# Patient Record
Sex: Female | Born: 1950 | Race: White | Hispanic: No | Marital: Married | Smoking: Never smoker
Health system: Southern US, Community
[De-identification: ages and names within clinical notes are randomized; demographics above are authoritative.]

## PROBLEM LIST (undated history)

## (undated) DIAGNOSIS — C3490 Malignant neoplasm of unspecified part of unspecified bronchus or lung: Secondary | ICD-10-CM

## (undated) DIAGNOSIS — K769 Liver disease, unspecified: Secondary | ICD-10-CM

## (undated) DIAGNOSIS — I10 Essential (primary) hypertension: Secondary | ICD-10-CM

## (undated) HISTORY — DX: Essential (primary) hypertension: I10

## (undated) HISTORY — DX: Liver disease, unspecified: K76.9

## (undated) HISTORY — PX: ABDOMINAL HYSTERECTOMY: SHX81

## (undated) HISTORY — PX: BREAST BIOPSY: SHX20

## (undated) HISTORY — DX: Malignant neoplasm of unspecified part of unspecified bronchus or lung: C34.90

---

## 2013-10-16 ENCOUNTER — Ambulatory Visit (INDEPENDENT_AMBULATORY_CARE_PROVIDER_SITE_OTHER): Payer: BC Managed Care – PPO | Admitting: Emergency Medicine

## 2013-10-16 ENCOUNTER — Other Ambulatory Visit (INDEPENDENT_AMBULATORY_CARE_PROVIDER_SITE_OTHER): Payer: BC Managed Care – PPO

## 2013-10-16 ENCOUNTER — Encounter (INDEPENDENT_AMBULATORY_CARE_PROVIDER_SITE_OTHER): Payer: Self-pay

## 2013-10-16 ENCOUNTER — Encounter: Payer: Self-pay | Admitting: Emergency Medicine

## 2013-10-16 VITALS — BP 128/86 | HR 79 | Ht 66.0 in | Wt 209.0 lb

## 2013-10-16 DIAGNOSIS — R222 Localized swelling, mass and lump, trunk: Secondary | ICD-10-CM

## 2013-10-16 DIAGNOSIS — R918 Other nonspecific abnormal finding of lung field: Secondary | ICD-10-CM

## 2013-10-16 LAB — CBC WITH DIFFERENTIAL/PLATELET
BASOS PCT: 0.5 % (ref 0.0–3.0)
Basophils Absolute: 0 10*3/uL (ref 0.0–0.1)
EOS ABS: 0.2 10*3/uL (ref 0.0–0.7)
EOS PCT: 2.3 % (ref 0.0–5.0)
HCT: 37.4 % (ref 36.0–46.0)
HEMOGLOBIN: 12.7 g/dL (ref 12.0–15.0)
LYMPHS PCT: 41.5 % (ref 12.0–46.0)
Lymphs Abs: 3.4 10*3/uL (ref 0.7–4.0)
MCHC: 33.8 g/dL (ref 30.0–36.0)
MCV: 86.4 fl (ref 78.0–100.0)
MONOS PCT: 9.4 % (ref 3.0–12.0)
Monocytes Absolute: 0.8 10*3/uL (ref 0.1–1.0)
Neutro Abs: 3.8 10*3/uL (ref 1.4–7.7)
Neutrophils Relative %: 46.3 % (ref 43.0–77.0)
Platelets: 303 10*3/uL (ref 150.0–400.0)
RBC: 4.33 Mil/uL (ref 3.87–5.11)
RDW: 13.5 % (ref 11.5–15.5)
WBC: 8.1 10*3/uL (ref 4.0–10.5)

## 2013-10-16 LAB — PROTIME-INR
INR: 1 ratio (ref 0.8–1.0)
Prothrombin Time: 11.5 s (ref 9.6–13.1)

## 2013-10-16 NOTE — Patient Instructions (Signed)
We will perform a bronchoscopy on October 27, 2013, at 8:00 at [place] will need to show up at 7:15am.  We will perform blood work today Follow with Dr Lamonte Sakai in 1 month

## 2013-10-16 NOTE — Progress Notes (Signed)
   Subjective:    Patient ID: Debra Hodges, female    DOB: 07-15-1950, 63 y.o.   MRN: [id number]  HPI 63 yo woman, never smoker, hx HTN, allergies, hypothyroidism. She had a syncopal episode 2 weeks ago with a fall. She was seen in ED and had a CT chest that showed a rounded LUL mass. She was admitted and her HCTZ She blamed some cough on allergies > clear mucous, no color, no hemoptysis. No real sick prodrome, no fevers. She still has cough, worse at night and disturbs her sleep.    Review of Systems  Constitutional: Negative for fever and unexpected weight change.  HENT: Positive for congestion. Negative for dental problem, ear pain, nosebleeds, postnasal drip, rhinorrhea, sinus pressure, sneezing, sore throat and trouble swallowing.   Eyes: Negative for redness and itching.  Respiratory: Positive for cough. Negative for chest tightness, shortness of breath and wheezing.   Cardiovascular: Negative for palpitations and leg swelling.  Gastrointestinal: Negative for nausea and vomiting.  Genitourinary: Negative for dysuria.  Musculoskeletal: Negative for joint swelling.  Skin: Negative for rash.  Neurological: Negative for headaches.  Hematological: Does not bruise/bleed easily.  Psychiatric/Behavioral: Negative for dysphoric mood. The patient is not nervous/anxious.     Past Medical History  Diagnosis Date  . High blood pressure      Family History  Problem Relation Age of Onset  . High blood pressure Mother      History   Social History  . Marital Status: Married    Spouse Name: N/A    Number of Children: N/A  . Years of Education: N/A   Occupational History  . accountant    Social History Main Topics  . Smoking status: Never Smoker   . Smokeless tobacco: Not on file  . Alcohol Use: No     Comment: social  . Drug Use: No  . Sexual Activity: Not on file   Other Topics Concern  . Not on file   Social History Narrative  . No narrative on file     Allergies    Allergen Reactions  . Penicillins     hives     No outpatient prescriptions prior to visit.   No facility-administered medications prior to visit.       Objective:   Physical Exam Filed Vitals:   10/16/13 1612  BP: 128/86  Pulse: 79  Height: 5\' 6"  (1.676 m)  Weight: 209 lb (94.802 kg)  SpO2: 98%   Gen: Pleasant, well-nourished, in no distress,  normal affect  ENT: Bruising on the L side of her face, mouth clear,  oropharynx clear, no postnasal drip  Neck: No JVD, no TMG, no carotid bruits  Lungs: No use of accessory muscles, soft insp squeak and rhonchi L upper zone  Cardiovascular: RRR, heart sounds normal, no murmur or gallops, no peripheral edema  Musculoskeletal: No deformities, no cyanosis or clubbing  Neuro: alert, non focal  Skin: Warm, no lesions or rashes      Assessment & Plan:  Lung mass Reviewed CT scan from [place], shows a LUL mass with possible segmental bronchial endobronchial involvement.  - needs FOB for sampling, have scheduled for 10/27/13 - CBC and INR today

## 2013-10-16 NOTE — Assessment & Plan Note (Signed)
Reviewed CT scan from [facility], shows a LUL mass with possible segmental bronchial endobronchial involvement.  - needs FOB for sampling, have scheduled for 10/27/13 - CBC and INR today

## 2013-10-17 ENCOUNTER — Telehealth: Payer: Self-pay | Admitting: Emergency Medicine

## 2013-10-17 MED ORDER — HYDROCODONE-HOMATROPINE 5-1.5 MG/5ML PO SYRP: 5.0000 mL | ORAL_SOLUTION | Freq: Four times a day (QID) | ORAL | Status: DC | PRN

## 2013-10-17 NOTE — Telephone Encounter (Signed)
Spoke with pt and advised her per RB to come by and pick up rx for hycodan cough syrup. Pt verbalized understanding and will have her husband stop to pick up rx this evening. Nothing further needed

## 2013-10-17 NOTE — Telephone Encounter (Signed)
Spoke with the pt  She states that RB told her that he was going to be calling in something for cough, but nothing was sent  Please advise what you want her to have  Thanks!

## 2013-10-27 ENCOUNTER — Ambulatory Visit (HOSPITAL_COMMUNITY): Payer: No Typology Code available for payment source

## 2013-10-27 ENCOUNTER — Encounter (HOSPITAL_COMMUNITY): Payer: Self-pay | Admitting: Respiratory Therapy

## 2013-10-27 ENCOUNTER — Ambulatory Visit (HOSPITAL_COMMUNITY)
Admission: RE | Admit: 2013-10-27 | Discharge: 2013-10-27 | Disposition: A | Discharge status: Home / Self Care | Payer: No Typology Code available for payment source | Source: Ambulatory Visit | Attending: Emergency Medicine | Admitting: Emergency Medicine

## 2013-10-27 ENCOUNTER — Encounter (HOSPITAL_COMMUNITY)
Admission: RE | Disposition: A | Discharge status: Home / Self Care | Payer: BC Managed Care – PPO | Source: Ambulatory Visit | Attending: Emergency Medicine

## 2013-10-27 DIAGNOSIS — R918 Other nonspecific abnormal finding of lung field: Secondary | ICD-10-CM

## 2013-10-27 DIAGNOSIS — E039 Hypothyroidism, unspecified: Secondary | ICD-10-CM | POA: Insufficient documentation

## 2013-10-27 DIAGNOSIS — C3410 Malignant neoplasm of upper lobe, unspecified bronchus or lung: Secondary | ICD-10-CM | POA: Insufficient documentation

## 2013-10-27 DIAGNOSIS — R222 Localized swelling, mass and lump, trunk: Secondary | ICD-10-CM

## 2013-10-27 DIAGNOSIS — I10 Essential (primary) hypertension: Secondary | ICD-10-CM | POA: Insufficient documentation

## 2013-10-27 HISTORY — PX: VIDEO BRONCHOSCOPY: SHX5072

## 2013-10-27 SURGERY — BRONCHOSCOPY, WITH FLUOROSCOPY
Anesthesia: Moderate Sedation | Laterality: Bilateral

## 2013-10-27 MED ORDER — PHENYLEPHRINE HCL 0.25 % NA SOLN
NASAL | Status: DC | PRN
  Administered 2013-10-27: 2 via NASAL

## 2013-10-27 MED ORDER — FENTANYL CITRATE 0.05 MG/ML IJ SOLN
INTRAMUSCULAR | Status: DC | PRN
  Administered 2013-10-27 (×2): 25 ug via INTRAVENOUS
  Administered 2013-10-27: 75 ug via INTRAVENOUS
  Administered 2013-10-27: 50 ug via INTRAVENOUS
  Administered 2013-10-27: 25 ug via INTRAVENOUS

## 2013-10-27 MED ORDER — EPINEPHRINE HCL 0.1 MG/ML IJ SOSY
PREFILLED_SYRINGE | INTRAMUSCULAR | Status: DC | PRN
  Administered 2013-10-27: .05 mg via ENDOTRACHEOPULMONARY

## 2013-10-27 MED ORDER — MIDAZOLAM HCL 10 MG/2ML IJ SOLN
INTRAMUSCULAR | Status: AC
  Filled 2013-10-27: qty 4

## 2013-10-27 MED ORDER — FENTANYL CITRATE 0.05 MG/ML IJ SOLN
INTRAMUSCULAR | Status: AC
  Filled 2013-10-27: qty 4

## 2013-10-27 MED ORDER — LIDOCAINE HCL 2 % EX GEL: Freq: Once | CUTANEOUS | Status: DC

## 2013-10-27 MED ORDER — MIDAZOLAM HCL 10 MG/2ML IJ SOLN
INTRAMUSCULAR | Status: DC | PRN
  Administered 2013-10-27: 1 mg via INTRAVENOUS
  Administered 2013-10-27 (×2): 2 mg via INTRAVENOUS
  Administered 2013-10-27: 3 mg via INTRAVENOUS

## 2013-10-27 MED ORDER — LIDOCAINE HCL 1 % IJ SOLN
INTRAMUSCULAR | Status: DC | PRN
  Administered 2013-10-27: 5 mL

## 2013-10-27 MED ORDER — PHENYLEPHRINE HCL 0.25 % NA SOLN: 1.0000 | Freq: Four times a day (QID) | NASAL | Status: DC | PRN

## 2013-10-27 MED ORDER — SODIUM CHLORIDE 0.9 % IV SOLN
INTRAVENOUS | Status: DC
  Administered 2013-10-27: 08:00:00 via INTRAVENOUS

## 2013-10-27 MED ORDER — LIDOCAINE HCL 2 % EX GEL
CUTANEOUS | Status: DC | PRN
  Administered 2013-10-27: 2

## 2013-10-27 NOTE — Discharge Instructions (Signed)
Flexible Bronchoscopy, Care After These instructions give you information on caring for yourself after your procedure. Your doctor may also give you more specific instructions. Call your doctor if you have any problems or questions after your procedure. HOME CARE  Do not eat or drink anything for 2 hours after your procedure. If you try to eat or drink before the medicine wears off, food or drink could go into your lungs. You could also burn yourself.  After 2 hours have passed and when you can cough and gag normally, you may eat soft food and drink liquids slowly.  The day after the test, you may eat your normal diet.  You may do your normal activities.  Keep all doctor visits. GET HELP RIGHT AWAY IF:  You get more and more short of breath.  You get lightheaded.  You feel like you are going to pass out (faint).  You have chest pain.  You have new problems that worry you.  You cough up more than a little blood.  You cough up more blood than before. MAKE SURE YOU:  Understand these instructions.  Will watch your condition.  Will get help right away if you are not doing well or get worse. Document Released: 03/12/2009 Document Revised: 03/05/2013 Document Reviewed: 01/17/2013 [facility] Patient Information 2014 [place].   Do not eat or drink anything until 11 am today Monday June 1,2015

## 2013-10-27 NOTE — Interval H&P Note (Signed)
PCCM Interval Note  S: pt presents today for FOB with biopsies. She is clinically unchanged - has cough with mostly clear mucous. Cough suppression has helped some. No breathing changes, no CP. She understands the procedure and elects to proceed.   Filed Vitals:   10/27/13 0705 10/27/13 0710 10/27/13 0712  BP: 145/84 145/84 154/86  Pulse: 81 97 83  Resp: 22  82  SpO2: 99% 98% 98%   Plans:  FOB with biopsies  Baltazar Apo, MD, PhD 10/27/2013, 8:10 AM  Pulmonary and Critical Care [phone] or if no answer [phone]

## 2013-10-27 NOTE — Op Note (Signed)
Video Bronchoscopy Procedure Note  Date of Operation: 10/27/2013  Pre-op Diagnosis: LUL mass  Post-op Diagnosis: Same  Surgeon: Baltazar Apo  Assistants: none  Anesthesia: conscious sedation, moderate sedation  Meds Given: fentanyl 238mcg, versed 8mg  in divided doses, epi 1:10000 dil 5cc total, 1% lidocaine 25cc total  Operation: Flexible video fiberoptic bronchoscopy and biopsies.  Estimated Blood Loss: 16XW  Complications: none noted  Indications and History: Debra Hodges is 63 y.o. with history of tobacco use. She was found to have a LUL mass on CXR and then CT scan chest at [facility].  Recommendation was to perform video fiberoptic bronchoscopy with biopsies. The risks, benefits, complications, treatment options and expected outcomes were discussed with the patient.  The possibilities of pneumothorax, pneumonia, reaction to medication, pulmonary aspiration, perforation of a viscus, bleeding, failure to diagnose a condition and creating a complication requiring transfusion or operation were discussed with the patient who freely signed the consent.    Description of Procedure: The patient was seen in the Preoperative Area, was examined and was deemed appropriate to proceed.  The patient was taken to [facility], identified as Debra Hodges and the procedure verified as Flexible Video Fiberoptic Bronchoscopy.  A Time Out was held and the above information confirmed.   Conscious sedation was initiated as indicated above. The video fiberoptic bronchoscope was introduced via the R nare and a general inspection was performed which showed posterior pharyngeal cobblestoning but normal cords, normal trachea, normal main carina. The R sided airways were inspected and were diffusely edematous, but showed normal RUL, BI, RML and RLL. The L side was then inspected. The LUL carina and the lingular and LUL segmental airways were narrowed with some obvious vascular pale mucosa at  each carina. The LLL airways were grossly normal. The scope would pass into the LUL but all airways were narrow. As soon as the scope entered the LUL airway there was some oozing of blood. 5cc of 1:10000 dilution epi was injected onto the airways.  A brush and forceps would pass into the orifices of the lingula and LUL. Under fluoro guidance,  LUL brushings and transbronchial biopsies were performed.  Endobronchial brushings, endobronchial forceps biopsies then were performed. The initial moderate bleeding stopped quickly. Finally endobronchial washings were pooled to be sent for cytology. The patient tolerated the procedure well with the exception of frequent coughhing. The bronchoscope was removed. There were no obvious complications. A CXR will be performed.   Samples: 1. Transbronchial biopsies from LUL 2. Transbronchial brushings from LUL 2. Endobronchial brushings from LUL 3. Endobronchial forceps biopsies from LUL 4. Bronchial washings from LUL  Plans:  We will review the cytology results with the patient when they become available.  Outpatient followup will be with Dr Lamonte Sakai.    Baltazar Apo, MD, PhD 10/27/2013, 8:49 AM [facility] [phone] or if no answer [phone]

## 2013-10-27 NOTE — Progress Notes (Signed)
Video bronchoscopy  Intervention transbronchial biopsy  Intervention endobronchial biopsy  Intervention transbronchial brushing  Intervention endobronchial brushing  Intervention bronchial washing  Good tolerance pt. Coughed during entire procedure  Baltazar Apo, MD, PhD 10/27/2013, 11:26 AM [facility] [phone] or if no answer [phone]

## 2013-10-27 NOTE — H&P (View-Only) (Signed)
   Subjective:    Patient ID: Debra Hodges, female    DOB: 18-Aug-1950, 63 y.o.   MRN: [id number]  HPI 63 yo woman, never smoker, hx HTN, allergies, hypothyroidism. She had a syncopal episode 2 weeks ago with a fall. She was seen in ED and had a CT chest that showed a rounded LUL mass. She was admitted and her HCTZ She blamed some cough on allergies > clear mucous, no color, no hemoptysis. No real sick prodrome, no fevers. She still has cough, worse at night and disturbs her sleep.    Review of Systems  Constitutional: Negative for fever and unexpected weight change.  HENT: Positive for congestion. Negative for dental problem, ear pain, nosebleeds, postnasal drip, rhinorrhea, sinus pressure, sneezing, sore throat and trouble swallowing.   Eyes: Negative for redness and itching.  Respiratory: Positive for cough. Negative for chest tightness, shortness of breath and wheezing.   Cardiovascular: Negative for palpitations and leg swelling.  Gastrointestinal: Negative for nausea and vomiting.  Genitourinary: Negative for dysuria.  Musculoskeletal: Negative for joint swelling.  Skin: Negative for rash.  Neurological: Negative for headaches.  Hematological: Does not bruise/bleed easily.  Psychiatric/Behavioral: Negative for dysphoric mood. The patient is not nervous/anxious.     Past Medical History  Diagnosis Date  . High blood pressure      Family History  Problem Relation Age of Onset  . High blood pressure Mother      History   Social History  . Marital Status: Married    Spouse Name: N/A    Number of Children: N/A  . Years of Education: N/A   Occupational History  . accountant    Social History Main Topics  . Smoking status: Never Smoker   . Smokeless tobacco: Not on file  . Alcohol Use: No     Comment: social  . Drug Use: No  . Sexual Activity: Not on file   Other Topics Concern  . Not on file   Social History Narrative  . No narrative on file     Allergies    Allergen Reactions  . Penicillins     hives     No outpatient prescriptions prior to visit.   No facility-administered medications prior to visit.       Objective:   Physical Exam Filed Vitals:   10/16/13 1612  BP: 128/86  Pulse: 79  Height: 5\' 6"  (1.676 m)  Weight: 209 lb (94.802 kg)  SpO2: 98%   Gen: Pleasant, well-nourished, in no distress,  normal affect  ENT: Bruising on the L side of her face, mouth clear,  oropharynx clear, no postnasal drip  Neck: No JVD, no TMG, no carotid bruits  Lungs: No use of accessory muscles, soft insp squeak and rhonchi L upper zone  Cardiovascular: RRR, heart sounds normal, no murmur or gallops, no peripheral edema  Musculoskeletal: No deformities, no cyanosis or clubbing  Neuro: alert, non focal  Skin: Warm, no lesions or rashes      Assessment & Plan:  Lung mass Reviewed CT scan from [place], shows a LUL mass with possible segmental bronchial endobronchial involvement.  - needs FOB for sampling, have scheduled for 10/27/13 - CBC and INR today

## 2013-10-28 ENCOUNTER — Encounter (HOSPITAL_COMMUNITY): Payer: Self-pay | Admitting: Emergency Medicine

## 2013-10-29 ENCOUNTER — Telehealth: Payer: Self-pay | Admitting: Emergency Medicine

## 2013-10-29 DIAGNOSIS — C3490 Malignant neoplasm of unspecified part of unspecified bronchus or lung: Secondary | ICD-10-CM

## 2013-10-29 NOTE — Telephone Encounter (Signed)
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 I will refer her to thoracic oncology or [facility]. Her path will need to be sent for genetic testing. I will mention this at the Thoracic oncology meeting in the am

## 2013-10-29 NOTE — Telephone Encounter (Signed)
Before calling the pt, Mindy asked RB if pt needed ov to discuss results, and he stated that he would call her with these today  I called the pt and notified her of this She states to call her cell at [phone]

## 2013-10-30 ENCOUNTER — Telehealth: Payer: Self-pay | Admitting: *Deleted

## 2013-10-30 NOTE — Telephone Encounter (Signed)
Called pt set up appt for [facility] 11/06/13.  Pt verbalized understanding of appt time and place.

## 2013-10-31 ENCOUNTER — Telehealth: Payer: Self-pay | Admitting: Emergency Medicine

## 2013-10-31 MED ORDER — HYDROCODONE-HOMATROPINE 5-1.5 MG/5ML PO SYRP: 5.0000 mL | ORAL_SOLUTION | Freq: Four times a day (QID) | ORAL | Status: DC | PRN

## 2013-10-31 NOTE — Telephone Encounter (Signed)
Pt in lobby to get rx

## 2013-10-31 NOTE — Telephone Encounter (Signed)
Per RB ok for hycodan rx

## 2013-11-03 ENCOUNTER — Encounter: Payer: Self-pay | Admitting: *Deleted

## 2013-11-05 ENCOUNTER — Other Ambulatory Visit: Payer: Self-pay | Admitting: *Deleted

## 2013-11-05 DIAGNOSIS — R918 Other nonspecific abnormal finding of lung field: Secondary | ICD-10-CM

## 2013-11-06 ENCOUNTER — Ambulatory Visit: Payer: No Typology Code available for payment source | Admitting: Physical Therapy

## 2013-11-06 ENCOUNTER — Other Ambulatory Visit (HOSPITAL_COMMUNITY)
Admission: RE | Admit: 2013-11-06 | Discharge: 2013-11-06 | Disposition: A | Discharge status: Home / Self Care | Payer: No Typology Code available for payment source | Source: Ambulatory Visit | Attending: Internal Medicine | Admitting: Internal Medicine

## 2013-11-06 ENCOUNTER — Ambulatory Visit (HOSPITAL_COMMUNITY)
Admission: RE | Admit: 2013-11-06 | Discharge: 2013-11-06 | Disposition: A | Discharge status: Home / Self Care | Payer: No Typology Code available for payment source | Source: Ambulatory Visit | Attending: Cardiothoracic Surgery | Admitting: Cardiothoracic Surgery

## 2013-11-06 ENCOUNTER — Other Ambulatory Visit: Payer: BC Managed Care – PPO

## 2013-11-06 ENCOUNTER — Ambulatory Visit
Admission: RE | Admit: 2013-11-06 | Payer: BC Managed Care – PPO | Source: Ambulatory Visit | Admitting: Radiation Oncology

## 2013-11-06 ENCOUNTER — Other Ambulatory Visit: Payer: Self-pay | Admitting: *Deleted

## 2013-11-06 ENCOUNTER — Telehealth: Payer: Self-pay | Admitting: *Deleted

## 2013-11-06 ENCOUNTER — Ambulatory Visit: Payer: BC Managed Care – PPO | Admitting: Internal Medicine

## 2013-11-06 DIAGNOSIS — R0609 Other forms of dyspnea: Secondary | ICD-10-CM | POA: Insufficient documentation

## 2013-11-06 DIAGNOSIS — R918 Other nonspecific abnormal finding of lung field: Secondary | ICD-10-CM

## 2013-11-06 DIAGNOSIS — R059 Cough, unspecified: Secondary | ICD-10-CM | POA: Insufficient documentation

## 2013-11-06 DIAGNOSIS — R05 Cough: Secondary | ICD-10-CM | POA: Insufficient documentation

## 2013-11-06 DIAGNOSIS — C3490 Malignant neoplasm of unspecified part of unspecified bronchus or lung: Secondary | ICD-10-CM | POA: Insufficient documentation

## 2013-11-06 DIAGNOSIS — R0989 Other specified symptoms and signs involving the circulatory and respiratory systems: Secondary | ICD-10-CM | POA: Insufficient documentation

## 2013-11-06 DIAGNOSIS — R062 Wheezing: Secondary | ICD-10-CM | POA: Insufficient documentation

## 2013-11-06 LAB — PULMONARY FUNCTION TEST
DL/VA % pred: 81 %
DL/VA: 4.19 ml/min/mmHg/L
DLCO unc % pred: 61 %
DLCO unc: 17.33 ml/min/mmHg
FEF 25-75 Pre: 1.5 L/sec
FEF2575-%Pred-Pre: 63 %
FEV1-%Pred-Pre: 69 %
FEV1-Pre: 1.92 L
FEV1FVC-%Pred-Pre: 93 %
FEV6-%Pred-Pre: 77 %
FEV6-Pre: 2.66 L
FEV6FVC-%Pred-Pre: 103 %
FVC-%Pred-Pre: 74 %
FVC-Pre: 2.67 L
Pre FEV1/FVC ratio: 72 %
Pre FEV6/FVC Ratio: 100 %
RV % pred: 80 %
RV: 1.78 L
TLC % pred: 80 %
TLC: 4.41 L

## 2013-11-06 LAB — CREATININE, SERUM
Creatinine, Ser: 0.71 mg/dL (ref 0.50–1.10)
GFR calc Af Amer: >90 mL/min (ref >90)
GFR calc non Af Amer: 90 mL/min — ABNORMAL LOW (ref >90)

## 2013-11-06 MED ORDER — GADOBENATE DIMEGLUMINE 529 MG/ML IV SOLN
20.0000 mL | Freq: Once | INTRAVENOUS | Status: AC | PRN
  Administered 2013-11-06: 20 mL via INTRAVENOUS

## 2013-11-06 NOTE — Telephone Encounter (Signed)
Called pt to let her know she does not need to be seen today.  She need further work up.  She was frustrated but was understanding.  Dr. Servando Snare gave me verbal order and I will called to get PET/MRI Brain/PFT's.  I stated I would call back.

## 2013-11-06 NOTE — Telephone Encounter (Signed)
Called pt back to set up appt.'s she verbalized understanding

## 2013-11-06 NOTE — Telephone Encounter (Signed)
Called pt back with appt for PET 11/11/13 at 7:45 and [facility] 11/13/13 at 4:00.  She verbalized understanding of appt time and place.

## 2013-11-10 ENCOUNTER — Telehealth: Payer: Self-pay | Admitting: Emergency Medicine

## 2013-11-10 NOTE — Telephone Encounter (Signed)
Advised pt of MRI results per RB Pt verbalized understanding and had no further questions

## 2013-11-10 NOTE — Telephone Encounter (Signed)
Per RB ok to cancel appointment for 11/12/13. Pt requesting MRI results please advised RB, thanks

## 2013-11-10 NOTE — Telephone Encounter (Signed)
Please let her know the MRI is negative. No evidence of cancer involvement

## 2013-11-11 ENCOUNTER — Ambulatory Visit: Payer: Self-pay | Admitting: Cardiothoracic Surgery

## 2013-11-12 ENCOUNTER — Ambulatory Visit: Payer: BC Managed Care – PPO | Admitting: Emergency Medicine

## 2013-11-13 ENCOUNTER — Telehealth: Payer: Self-pay | Admitting: Internal Medicine

## 2013-11-13 ENCOUNTER — Ambulatory Visit (HOSPITAL_BASED_OUTPATIENT_CLINIC_OR_DEPARTMENT_OTHER): Payer: No Typology Code available for payment source | Admitting: Internal Medicine

## 2013-11-13 ENCOUNTER — Encounter: Payer: Self-pay | Admitting: Internal Medicine

## 2013-11-13 ENCOUNTER — Telehealth: Payer: Self-pay | Admitting: *Deleted

## 2013-11-13 ENCOUNTER — Ambulatory Visit: Payer: No Typology Code available for payment source | Attending: Internal Medicine | Admitting: Physical Therapy

## 2013-11-13 VITALS — BP 152/72 | HR 91 | Temp 98.3°F | Resp 17 | Ht 66.0 in | Wt 207.0 lb

## 2013-11-13 DIAGNOSIS — C3490 Malignant neoplasm of unspecified part of unspecified bronchus or lung: Secondary | ICD-10-CM | POA: Insufficient documentation

## 2013-11-13 DIAGNOSIS — Z9181 History of falling: Secondary | ICD-10-CM | POA: Insufficient documentation

## 2013-11-13 DIAGNOSIS — IMO0001 Reserved for inherently not codable concepts without codable children: Secondary | ICD-10-CM | POA: Insufficient documentation

## 2013-11-13 DIAGNOSIS — C3412 Malignant neoplasm of upper lobe, left bronchus or lung: Secondary | ICD-10-CM

## 2013-11-13 DIAGNOSIS — C3410 Malignant neoplasm of upper lobe, unspecified bronchus or lung: Secondary | ICD-10-CM

## 2013-11-13 DIAGNOSIS — C787 Secondary malignant neoplasm of liver and intrahepatic bile duct: Secondary | ICD-10-CM

## 2013-11-13 NOTE — Telephone Encounter (Signed)
This number is disconnected

## 2013-11-13 NOTE — Progress Notes (Signed)
[place] Telephone:(336) [phone]   Fax:(336) [phone] Multidisciplinary thoracic oncology clinic ([place])  CONSULT NOTE  REFERRING PHYSICIAN: Dr. Baltazar Apo  REASON FOR CONSULTATION:  63 years old white female recently diagnosed with lung cancer.  HPI Debra Hodges Hodges is a 63 y.o. female a never smoker with past medical history significant for hypertension and hypothyroidism. The patient mentioned that 6 weeks ago she had few episodes of syncope and falls. She passed out and she was evaluated at the emergency Department at [facility] and [facility]. Chest x-ray followed by CT scan of the chest performed at that time showed a rounded left upper lobe mass. She was referred to Dr. Lamonte Sakai for evaluation regarding these findings. On 10/27/2013 the patient underwent flexible video fiberoptic bronchoscopy and biopsy of the left upper lobe lung nodule.  The final pathology (Accession: [id number]) was consistent with adenocarcinoma. The patient was referred initially to thoracic surgery with the expectation that she has early stage disease for consideration of surgical resection. The tissue blocks were sent to [facility] for molecular biomarker studies. She had a recent PET scan as well as MRI of the brain performed. MRI of the brain on 11/06/2013 showed no acute or metastatic intracranial abnormalities. PET scan performed at [facility] on 11/11/2013 showed large lingular mass markedly hypermetabolic and consistent with lung cancer. There are hypermetabolic left hilar nodes and contralateral paratracheal adenopathy. There was a small scattered pulmonary nodules suspicious for pulmonary metastatic disease in addition to hepatic metastatic disease. The patient was referred to me today for evaluation and recommendation regarding treatment of her recently diagnosed metastatic lung cancer. When seen today the patient is feeling fine except for occasional nausea  and cough productive of clear sputum and shortness of breath on exertion. She also has wheezes in her chest. She denied having any significant chest pain or hemoptysis. The patient denied having any significant weight loss or night sweats. She has no nausea or vomiting, no fever or chills. She denied having any significant headache or visual changes.  Family history significant for a mother with history of melanoma and father diagnosed with esophageal cancer at age 96. The patient is married and has one daughter. She was accompanied today by her husband. Debra Hodges Hodges. She works as an Optometrist. She has no history of smoking, alcohol or drug abuse. HPI  Past Medical History  Diagnosis Date  . High blood pressure     Past Surgical History  Procedure Laterality Date  . Abdominal hysterectomy    . Breast biopsy      Right breast  . Video bronchoscopy Bilateral 10/27/2013    Procedure: VIDEO BRONCHOSCOPY WITH FLUORO;  Surgeon: Collene Gobble, MD;  Location: [facility];  Service: Cardiopulmonary;  Laterality: Bilateral;    Family History  Problem Relation Age of Onset  . High blood pressure Mother     Social History History  Substance Use Topics  . Smoking status: Never Smoker   . Smokeless tobacco: Not on file  . Alcohol Use: No     Comment: social    Allergies  Allergen Reactions  . Penicillins     hives    Current Outpatient Prescriptions  Medication Sig Dispense Refill  . calcium carbonate (OS-CAL) 600 MG TABS tablet Take 600 mg by mouth daily.       Marland Kitchen CLIMARA 0.1 MG/24HR patch 1 patch once a week.      . diclofenac (VOLTAREN) 75 MG EC tablet Take 75 mg  by mouth 2 (two) times daily as needed.      Marland Kitchen HYDROcodone-homatropine (HYCODAN) 5-1.5 MG/5ML syrup Take 5 mLs by mouth every 6 (six) hours as needed for cough.  240 mL  0  . levothyroxine (SYNTHROID, LEVOTHROID) 50 MCG tablet Take 50 mcg by mouth daily before breakfast.      . metroNIDAZOLE (METROCREAM) 0.75 % cream Apply 1  application topically daily.      . Multiple Vitamins-Calcium (ONE-A-DAY WOMENS PO) Take 1 tablet by mouth daily.      . potassium chloride SA (K-DUR,KLOR-CON) 20 MEQ tablet 1 tablet 2 (two) times daily.      Marland Kitchen spironolactone (ALDACTONE) 25 MG tablet 1 tablet daily.      . vitamin C (ASCORBIC ACID) 500 MG tablet Take 500 mg by mouth daily.       No current facility-administered medications for this visit.    Review of Systems  Constitutional: negative Eyes: negative Ears, nose, mouth, throat, and face: negative Respiratory: positive for cough, dyspnea on exertion, sputum and wheezing Cardiovascular: negative Gastrointestinal: positive for nausea Genitourinary:negative Integument/breast: negative Hematologic/lymphatic: negative Musculoskeletal:negative Neurological: negative Behavioral/Psych: negative Endocrine: negative Allergic/Immunologic: negative  Physical Exam  RJJ:[id number], healthy, no distress, well nourished and well developed SKIN: skin color, texture, turgor are normal, no rashes or significant lesions HEAD: Normocephalic, No masses, lesions, tenderness or abnormalities EYES: normal, PERRLA EARS: External ears normal, Canals clear OROPHARYNX:no exudate, no erythema and lips, buccal mucosa, and tongue normal  NECK: supple, no adenopathy, no JVD LYMPH:  no palpable lymphadenopathy, no hepatosplenomegaly BREAST:not examined LUNGS: clear to auscultation , and palpation HEART: regular rate & rhythm, no murmurs and no gallops ABDOMEN:abdomen soft, non-tender, normal bowel sounds and no masses or organomegaly BACK: Back symmetric, no curvature., No CVA tenderness EXTREMITIES:no joint deformities, effusion, or inflammation, no edema, no skin discoloration  NEURO: alert & oriented x 3 with fluent speech, no focal motor/sensory deficits  PERFORMANCE STATUS: ECOG 1  LABORATORY DATA: Lab Results  Component Value Date   WBC 8.1 10/16/2013   HGB 12.7 10/16/2013   HCT 37.4  10/16/2013   MCV 86.4 10/16/2013   PLT 303.0 10/16/2013      Chemistry      Component Value Date/Time   CREATININE 0.71 Nov 18, 2013 1813   No results found for this basename: CALCIUM, ALKPHOS, AST, ALT, BILITOT       RADIOGRAPHIC STUDIES: Mr Kizzie Fantasia Contrast  November 18, 2013   CLINICAL DATA:  63 year old female with newly diagnosed lung cancer. Staging. Subsequent encounter.  EXAM: MRI HEAD WITHOUT AND WITH CONTRAST  TECHNIQUE: Multiplanar, multiecho pulse sequences of the brain and surrounding structures were obtained without and with intravenous contrast.  CONTRAST:  34m MULTIHANCE GADOBENATE DIMEGLUMINE 529 MG/ML IV SOLN  COMPARISON:  Head CT without contrast 10/01/2013.  FINDINGS: No abnormal enhancement identified. No midline shift, mass effect, or evidence of intracranial mass lesion.  No restricted diffusion to suggest acute infarction. No ventriculomegaly, extra-axial collection or acute intracranial hemorrhage. Cervicomedullary junction and pituitary are within normal limits. Cerebral volume is within normal limits for age. Major intracranial vascular flow voids are preserved. GPearline Cablesand white matter signal is within normal limits for age throughout the brain.  Grossly negative visualized cervical spinal cord. Mildly heterogeneous bone marrow signal diffusely. No destructive or suspicious marrow lesion identified. Visualized scalp soft tissues are within normal limits. Visible internal auditory structures appear normal. Negative paranasal sinuses and mastoids except for minor maxillary mucosal thickening. Visualized orbit soft tissues are within  normal limits.  IMPRESSION: No acute or metastatic intracranial abnormality. Normal for age MRI appearance of the brain.   Electronically Signed   By: Lars Pinks M.D.   On: 11/06/2013 20:27   Dg Chest Port 1 View  10/27/2013   CLINICAL DATA:  Status post bronchoscopy with biopsies.  EXAM: PORTABLE CHEST - 1 VIEW  COMPARISON:  CT chest and chest radiograph  10/01/2013.  FINDINGS: Trachea is midline. Heart size normal. Masslike opacity in the lingula is unchanged. No pneumothorax after bronchoscopic biopsy. Right lung is clear.  IMPRESSION: 1. No pneumothorax after bronchoscopic biopsy. 2. Masslike opacity in the lingula, as before, worrisome for primary bronchogenic carcinoma.   Electronically Signed   By: Lorin Picket M.D.   On: 10/27/2013 09:55   Dg C-arm Bronchoscopy  10/27/2013   CLINICAL DATA: Lung Mass   C-ARM BRONCHOSCOPY  Fluoroscopy was utilized by the requesting physician.  No radiographic  interpretation.     ASSESSMENT: This is a very pleasant 63 years old never smoker white female recently diagnosed with Stage IV (T1b, N3, M1b) non-small cell lung cancer, adenocarcinoma presented with left upper lobe lung mass in addition to left hilar and bilateral mediastinal lymphadenopathy as well as liver metastasis diagnosed in May of 2015. Molecular biomarker studies are still pending.   PLAN: I had a lengthy discussion with the patient and her husband today about her current disease stage, prognosis and treatment options. I explained to the patient that she has incurable condition and the treatment would be of palliative nature. She was given the option of palliative care and hospice referral versus consideration of systemic therapy either with targeted agent or systemic chemotherapy. The patient is a never smoker and there is a high probably that her tumor could harbor a driver mutation like EGFR mutation or ALK gene translocation. The tissue blocks were sent for molecular biomarker studies but the results are still pending. She is currently asymptomatic except for mild shortness of breath and cough. I recommended for the patient to wait for the molecular studies before discussion of any further treatment options. If the molecular studies are negative for driver mutations, I would consider the patient for systemic chemotherapy with carboplatin for  AUC of 5 and Alimta 500 mg/M2 every 3 weeks. She may be also considered for enrollment in the ECoG 5508 clinical trial with induction chemotherapy of carboplatin, paclitaxel and Avastin followed by maintenance randomization.  the patient agreed to wait until the molecular studies are available. I will arrange for the patient a followup appointment with me in 2 weeks for reevaluation and more detailed discussion of her treatment options. She was advised to call immediately if she has any concerning symptoms in the interval.  ADVANCE DIRECTIVES: The patient does not have advanced directives and she was given information packet.  The patient voices understanding of current disease status and treatment options and is in agreement with the current care plan.  All questions were answered. The patient knows to call the clinic with any problems, questions or concerns. We can certainly see the patient much sooner if necessary.  Thank you so much for allowing me to participate in the care of [facility]. I will continue to follow up the patient with you and assist in her care.  I spent 55 minutes counseling the patient face to face. The total time spent in the appointment was 80 minutes.  Disclaimer: This note was dictated with voice recognition software. Similar sounding words can inadvertently be  transcribed and may not be corrected upon review.   Rogan Ecklund K. 11/13/2013, 4:02 PM

## 2013-11-13 NOTE — Telephone Encounter (Signed)
gv adn printed appt sched and avs for pt for July  °

## 2013-11-13 NOTE — Telephone Encounter (Signed)
Called pt to change appt time

## 2013-11-14 ENCOUNTER — Encounter: Payer: Self-pay | Admitting: *Deleted

## 2013-11-14 ENCOUNTER — Telehealth: Payer: Self-pay | Admitting: *Deleted

## 2013-11-14 NOTE — [facility] Oncology Navigator Note (Unsigned)
Pt called and requested records.  I stated when pt comes back to cancer center she could fill out paper work to obtain records.

## 2013-11-14 NOTE — Telephone Encounter (Signed)
Called to check on pt after clinic yesterday.  Left vm message to call if needed.

## 2013-11-17 ENCOUNTER — Telehealth: Payer: Self-pay | Admitting: Emergency Medicine

## 2013-11-17 MED ORDER — HYDROCODONE-HOMATROPINE 5-1.5 MG/5ML PO SYRP: 5.0000 mL | ORAL_SOLUTION | Freq: Four times a day (QID) | ORAL | Status: DC | PRN

## 2013-11-17 NOTE — Telephone Encounter (Signed)
Rx signed and is up front for pick up  Spoke with pt and made aware this was done

## 2013-11-17 NOTE — Telephone Encounter (Signed)
[place] for refill of hydrometr. I can sign. I am in [place] in PM 11/17/2013  Dr. Brand Males, M.D., [facility].C.P Pulmonary and Critical Care Medicine Staff Physician [place] [facility] Pager: [phone], If no answer or between  15:00h - 7:00h: call [phone]  11/17/2013 12:46 PM

## 2013-11-17 NOTE — Telephone Encounter (Signed)
Pt returned call & can be reached at [phone].  Satira Anis

## 2013-11-17 NOTE — Telephone Encounter (Signed)
lmomtcb x1 for pt 

## 2013-11-17 NOTE — Telephone Encounter (Signed)
Thanks so much  Rx was given to [place] to have you sign  Please return to triage once done so that we can let the pt know  Thanks!

## 2013-11-17 NOTE — Telephone Encounter (Signed)
Spoke with the pt  She states that she has 1 dose left of the hydromet that RB prescribed for her on 10/31/13- # 240 ml with no refills  She states that her cough is no worse since last ov, but it is no better and would like refill on hydromet RB out of the office and so will not be able to sign rx  Will forward to doc of the day, please advise thanks!

## 2013-11-20 ENCOUNTER — Telehealth: Payer: Self-pay | Admitting: *Deleted

## 2013-11-20 NOTE — Telephone Encounter (Signed)
Called pt with appt 11/21/13 at 10:15 labs and 10:30 with Dr. Julien Nordmann.  Pt verbalized understanding of appt

## 2013-11-21 ENCOUNTER — Encounter: Payer: Self-pay | Admitting: Internal Medicine

## 2013-11-21 ENCOUNTER — Telehealth: Payer: Self-pay | Admitting: Internal Medicine

## 2013-11-21 ENCOUNTER — Other Ambulatory Visit (HOSPITAL_BASED_OUTPATIENT_CLINIC_OR_DEPARTMENT_OTHER): Payer: No Typology Code available for payment source

## 2013-11-21 ENCOUNTER — Ambulatory Visit (HOSPITAL_BASED_OUTPATIENT_CLINIC_OR_DEPARTMENT_OTHER): Payer: No Typology Code available for payment source | Admitting: Internal Medicine

## 2013-11-21 VITALS — BP 134/73 | HR 91 | Temp 98.4°F | Resp 19 | Ht 66.0 in | Wt 205.0 lb

## 2013-11-21 DIAGNOSIS — C3410 Malignant neoplasm of upper lobe, unspecified bronchus or lung: Secondary | ICD-10-CM

## 2013-11-21 DIAGNOSIS — C3412 Malignant neoplasm of upper lobe, left bronchus or lung: Secondary | ICD-10-CM

## 2013-11-21 DIAGNOSIS — R0989 Other specified symptoms and signs involving the circulatory and respiratory systems: Secondary | ICD-10-CM

## 2013-11-21 DIAGNOSIS — R059 Cough, unspecified: Secondary | ICD-10-CM

## 2013-11-21 DIAGNOSIS — R918 Other nonspecific abnormal finding of lung field: Secondary | ICD-10-CM

## 2013-11-21 DIAGNOSIS — C787 Secondary malignant neoplasm of liver and intrahepatic bile duct: Secondary | ICD-10-CM

## 2013-11-21 DIAGNOSIS — R05 Cough: Secondary | ICD-10-CM

## 2013-11-21 DIAGNOSIS — R0609 Other forms of dyspnea: Secondary | ICD-10-CM

## 2013-11-21 LAB — CBC WITH DIFFERENTIAL/PLATELET
BASO%: 0.3 % (ref 0.0–2.0)
Basophils Absolute: 0 10*3/uL (ref 0.0–0.1)
EOS ABS: 0.1 10*3/uL (ref 0.0–0.5)
EOS%: 1.6 % (ref 0.0–7.0)
HCT: 38.3 % (ref 34.8–46.6)
HGB: 12.7 g/dL (ref 11.6–15.9)
LYMPH%: 30.2 % (ref 14.0–49.7)
MCH: 28.6 pg (ref 25.1–34.0)
MCHC: 33.1 g/dL (ref 31.5–36.0)
MCV: 86.2 fL (ref 79.5–101.0)
MONO#: 0.7 10*3/uL (ref 0.1–0.9)
MONO%: 7.7 % (ref 0.0–14.0)
NEUT%: 60.2 % (ref 38.4–76.8)
NEUTROS ABS: 5.5 10*3/uL (ref 1.5–6.5)
Platelets: 307 10*3/uL (ref 145–400)
RBC: 4.45 10*6/uL (ref 3.70–5.45)
RDW: 13.5 % (ref 11.2–14.5)
WBC: 9.2 10*3/uL (ref 3.9–10.3)
lymph#: 2.8 10*3/uL (ref 0.9–3.3)

## 2013-11-21 LAB — COMPREHENSIVE METABOLIC PANEL (CC13)
ALBUMIN: 3.9 g/dL (ref 3.5–5.0)
ALK PHOS: 94 U/L (ref 40–150)
ALT: 23 U/L (ref 0–55)
AST: 25 U/L (ref 5–34)
Anion Gap: 9 mEq/L (ref 3–11)
BUN: 13.3 mg/dL (ref 7.0–26.0)
CO2: 25 mEq/L (ref 22–29)
Calcium: 9.6 mg/dL (ref 8.4–10.4)
Chloride: 106 mEq/L (ref 98–109)
Creatinine: 0.9 mg/dL (ref 0.6–1.1)
GLUCOSE: 97 mg/dL (ref 70–140)
POTASSIUM: 4.3 meq/L (ref 3.5–5.1)
Sodium: 140 mEq/L (ref 136–145)
Total Bilirubin: 0.52 mg/dL (ref 0.20–1.20)
Total Protein: 7.9 g/dL (ref 6.4–8.3)

## 2013-11-21 MED ORDER — ERLOTINIB HCL 150 MG PO TABS: 150.0000 mg | ORAL_TABLET | Freq: Every day | ORAL | Status: DC

## 2013-11-21 NOTE — Telephone Encounter (Signed)
gv and printed appt sched and avs fo rpt for July °

## 2013-11-21 NOTE — Telephone Encounter (Signed)
Faxed pt medical records to Dr.  Loyal Buba in [place]

## 2013-11-21 NOTE — Progress Notes (Signed)
Release of information form filled out by patient for records to be sent to Dr Hollice Gong in [place].  Form given to Medical records to review and process.  SLJ

## 2013-11-21 NOTE — Progress Notes (Signed)
[place] @ [phone] for tarceva pa form

## 2013-11-21 NOTE — Progress Notes (Signed)
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  PRIOR THERAPY: None  CURRENT THERAPY: Tarceva 150 mg by mouth daily. Expected to be started in the next few days.  INTERVAL HISTORY: Debra Hodges 63 y.o. female returns to the clinic today for followup visit accompanied by her husband. The patient is feeling fine today with no specific complaints except for the dry cough. She is currently on Hycodan with mild improvement. She denied having any significant chest pain but has mild shortness breath with exertion with no hemoptysis. She denied having any significant weight loss or night sweats. She has no nausea or vomiting. The molecular studies from [facility] showed EGFR amplification in addition to EGFR mutation in exon 21 ([id number]). She is in today for evaluation and discussion of her treatment options based on the recent molecular study.  MEDICAL HISTORY: Past Medical History  Diagnosis Date  . High blood pressure     ALLERGIES:  is allergic to penicillins.  MEDICATIONS:  Current Outpatient Prescriptions  Medication Sig Dispense Refill  . calcium carbonate (OS-CAL) 600 MG TABS tablet Take 600 mg by mouth daily.       Marland Kitchen CLIMARA 0.1 MG/24HR patch 1 patch once a week.      . diclofenac (VOLTAREN) 75 MG EC tablet Take 75 mg by mouth 2 (two) times daily as needed.      Marland Kitchen HYDROcodone-homatropine (HYCODAN) 5-1.5 MG/5ML syrup Take 5 mLs by mouth every 6 (six) hours as needed for cough.  240 mL  0  . levothyroxine (SYNTHROID, LEVOTHROID) 25 MCG tablet Take 25 mcg by mouth daily before breakfast.      .  metroNIDAZOLE (METROCREAM) 0.75 % cream Apply 1 application topically daily.      . Multiple Vitamins-Calcium (ONE-A-DAY WOMENS PO) Take 1 tablet by mouth daily.      . potassium chloride SA (K-DUR,KLOR-CON) 20 MEQ tablet 1 tablet 2 (two) times daily.      Marland Kitchen spironolactone (ALDACTONE) 25 MG tablet 1 tablet daily.      . vitamin C (ASCORBIC ACID) 500 MG tablet Take 500 mg by mouth daily.       No current facility-administered medications for this visit.    SURGICAL HISTORY:  Past Surgical History  Procedure Laterality Date  . Abdominal hysterectomy    . Breast biopsy      Right breast  . Video bronchoscopy Bilateral 10/27/2013    Procedure: VIDEO BRONCHOSCOPY WITH FLUORO;  Surgeon: Collene Gobble, MD;  Location: [facility];  Service: Cardiopulmonary;  Laterality: Bilateral;    REVIEW OF SYSTEMS:  Constitutional: negative Eyes: negative Ears, nose, mouth, throat, and face: negative Respiratory: positive for cough Cardiovascular: negative Gastrointestinal: negative Genitourinary:negative Integument/breast: negative Hematologic/lymphatic: negative Musculoskeletal:negative Neurological: negative Behavioral/Psych: negative Endocrine: negative Allergic/Immunologic: negative   PHYSICAL EXAMINATION: General appearance: alert, cooperative and no distress Head: Normocephalic, without obvious abnormality, atraumatic Neck: no adenopathy, no JVD, supple, symmetrical, trachea midline and thyroid not enlarged, symmetric, no tenderness/mass/nodules Lymph nodes: Cervical, supraclavicular, and axillary nodes normal. Resp: clear to auscultation bilaterally Back: symmetric, no curvature. ROM normal. No CVA tenderness. Cardio: regular rate and rhythm, S1,  S2 normal, no murmur, click, rub or gallop GI: soft, non-tender; bowel sounds normal; no masses,  no organomegaly Extremities: extremities normal, atraumatic, no cyanosis or edema Neurologic: Alert and oriented X 3, normal strength and tone.  Normal symmetric reflexes. Normal coordination and gait  ECOG PERFORMANCE STATUS: 1 - Symptomatic but completely ambulatory  Blood pressure 134/73, pulse 91, temperature 98.4 F (36.9 C), temperature source Oral, resp. rate 19, height $RemoveBe'5\' 6"'jnprMYDtX$  (1.676 m), weight 205 lb (92.987 kg), SpO2 98.00%.  LABORATORY DATA: Lab Results  Component Value Date   WBC 9.2 11/21/2013   HGB 12.7 11/21/2013   HCT 38.3 11/21/2013   MCV 86.2 11/21/2013   PLT 307 11/21/2013      Chemistry      Component Value Date/Time   NA 140 11/21/2013 1006   K 4.3 11/21/2013 1006   CO2 25 11/21/2013 1006   BUN 13.3 11/21/2013 1006   CREATININE 0.9 11/21/2013 1006   CREATININE 0.71 2013/12/02 1813      Component Value Date/Time   CALCIUM 9.6 11/21/2013 1006   ALKPHOS 94 11/21/2013 1006   AST 25 11/21/2013 1006   ALT 23 11/21/2013 1006   BILITOT 0.52 11/21/2013 1006       RADIOGRAPHIC STUDIES: Mr Kizzie Fantasia Contrast  Dec 02, 2013   CLINICAL DATA:  63 year old female with newly diagnosed lung cancer. Staging. Subsequent encounter.  EXAM: MRI HEAD WITHOUT AND WITH CONTRAST  TECHNIQUE: Multiplanar, multiecho pulse sequences of the brain and surrounding structures were obtained without and with intravenous contrast.  CONTRAST:  59mL MULTIHANCE GADOBENATE DIMEGLUMINE 529 MG/ML IV SOLN  COMPARISON:  Head CT without contrast 10/01/2013.  FINDINGS: No abnormal enhancement identified. No midline shift, mass effect, or evidence of intracranial mass lesion.  No restricted diffusion to suggest acute infarction. No ventriculomegaly, extra-axial collection or acute intracranial hemorrhage. Cervicomedullary junction and pituitary are within normal limits. Cerebral volume is within normal limits for age. Major intracranial vascular flow voids are preserved. Pearline Cables and white matter signal is within normal limits for age throughout the brain.  Grossly negative visualized cervical spinal cord. Mildly heterogeneous bone marrow signal diffusely. No  destructive or suspicious marrow lesion identified. Visualized scalp soft tissues are within normal limits. Visible internal auditory structures appear normal. Negative paranasal sinuses and mastoids except for minor maxillary mucosal thickening. Visualized orbit soft tissues are within normal limits.  IMPRESSION: No acute or metastatic intracranial abnormality. Normal for age MRI appearance of the brain.   Electronically Signed   By: Lars Pinks M.D.   On: 02-Dec-2013 20:27   Dg Chest Port 1 View  10/27/2013   CLINICAL DATA:  Status post bronchoscopy with biopsies.  EXAM: PORTABLE CHEST - 1 VIEW  COMPARISON:  CT chest and chest radiograph 10/01/2013.  FINDINGS: Trachea is midline. Heart size normal. Masslike opacity in the lingula is unchanged. No pneumothorax after bronchoscopic biopsy. Right lung is clear.  IMPRESSION: 1. No pneumothorax after bronchoscopic biopsy. 2. Masslike opacity in the lingula, as before, worrisome for primary bronchogenic carcinoma.   Electronically Signed   By: Lorin Picket M.D.   On: 10/27/2013 09:55   Dg C-arm Bronchoscopy  10/27/2013   CLINICAL DATA: Lung Mass   C-ARM BRONCHOSCOPY  Fluoroscopy was utilized by the requesting physician.  No radiographic  interpretation.     ASSESSMENT AND PLAN: This is a very pleasant 63 years old white female recently diagnosed with stage IV non-small cell lung cancer, adenocarcinoma with positive EGFR mutation in exon 21 (L858R). I  have a lengthy discussion with the patient and her husband today about her current disease stage, prognosis and treatment options. With the positive EGFR mutation, I strongly recommended for the patient to consider treatment with oral EGFR Tyrosine kinase inhibitor, like Tarceva The patient is interested in the treatment. I recommended for her Tarceva 150 mg by mouth daily. I will send her a prescription to [facility] outpatient pharmacy. I discussed with the patient adverse effect of Tarceva including  but not limited to skin rash, diarrhea, interstitial lung disease, dry skin, liver or renal dysfunction. She was given handout and starter kit for Tarceva. She was advised to use Imodium for diarrhea and to call us if she has any significant skin rash. We may consider her for treatment was clindamycin lotion as needed for skin rash. The patient signed the consent for the oral medication. She would come back for followup visit in 2 weeks for reevaluation with repeat CBC and comprehensive metabolic panel. She was advised to call immediately if she has any concerning symptoms in the interval. The patient voices understanding of current disease status and treatment options and is in agreement with the current care plan.  All questions were answered. The patient knows to call the clinic with any problems, questions or concerns. We can certainly see the patient much sooner if necessary.  I spent 15 minutes counseling the patient face to face. The total time spent in the appointment was 25 minutes.  Disclaimer: This note was dictated with voice recognition software. Similar sounding words can inadvertently be transcribed and may not be corrected upon review.

## 2013-11-24 ENCOUNTER — Telehealth: Payer: Self-pay | Admitting: *Deleted

## 2013-11-24 ENCOUNTER — Telehealth: Payer: Self-pay | Admitting: Internal Medicine

## 2013-11-24 ENCOUNTER — Encounter (HOSPITAL_COMMUNITY): Payer: Self-pay

## 2013-11-24 ENCOUNTER — Encounter: Payer: Self-pay | Admitting: Internal Medicine

## 2013-11-24 NOTE — Progress Notes (Signed)
[place], [id number], approved tarceva from 11/24/13-02/24/14

## 2013-11-24 NOTE — Telephone Encounter (Signed)
lvm for pt regarding to chemo edu.Marland KitchenMarland Kitchen

## 2013-11-24 NOTE — Telephone Encounter (Signed)
Pt called stating that she cannot make the 6/30 9am chemo class because she is still working.  Informed pt that we do have a 5pm class 1 day a week.  Called and left msg with education nurse regarding our options for chemo classes.  Informed pt that we will let her know what times we have available.  SLJ

## 2013-11-25 ENCOUNTER — Other Ambulatory Visit: Payer: Self-pay | Admitting: *Deleted

## 2013-11-25 ENCOUNTER — Telehealth: Payer: Self-pay | Admitting: *Deleted

## 2013-11-25 ENCOUNTER — Other Ambulatory Visit: Payer: No Typology Code available for payment source

## 2013-11-25 DIAGNOSIS — C3412 Malignant neoplasm of upper lobe, left bronchus or lung: Secondary | ICD-10-CM

## 2013-11-25 MED ORDER — ERLOTINIB HCL 150 MG PO TABS: 150.0000 mg | ORAL_TABLET | Freq: Every day | ORAL | Status: DC

## 2013-11-25 NOTE — Telephone Encounter (Signed)
No additional note

## 2013-11-25 NOTE — Telephone Encounter (Signed)
Rx e-scribed to [facility].

## 2013-11-27 ENCOUNTER — Ambulatory Visit: Payer: No Typology Code available for payment source | Admitting: Internal Medicine

## 2013-11-27 ENCOUNTER — Encounter: Payer: Self-pay | Admitting: Internal Medicine

## 2013-11-27 ENCOUNTER — Other Ambulatory Visit: Payer: No Typology Code available for payment source

## 2013-11-27 NOTE — Progress Notes (Signed)
[facility] transferred tarceva prescription to [facility] @ [phone] phone # [phone]

## 2013-11-27 NOTE — Telephone Encounter (Signed)
error 

## 2013-12-01 ENCOUNTER — Other Ambulatory Visit: Payer: Self-pay | Admitting: *Deleted

## 2013-12-01 ENCOUNTER — Telehealth: Payer: Self-pay | Admitting: Medical Oncology

## 2013-12-01 DIAGNOSIS — C3412 Malignant neoplasm of upper lobe, left bronchus or lung: Secondary | ICD-10-CM

## 2013-12-01 NOTE — Telephone Encounter (Signed)
CALLED TARCEVA TO [facility] [place].

## 2013-12-02 ENCOUNTER — Encounter: Payer: Self-pay | Admitting: *Deleted

## 2013-12-02 ENCOUNTER — Other Ambulatory Visit: Payer: No Typology Code available for payment source

## 2013-12-03 ENCOUNTER — Ambulatory Visit: Payer: No Typology Code available for payment source | Admitting: Internal Medicine

## 2013-12-03 ENCOUNTER — Other Ambulatory Visit: Payer: No Typology Code available for payment source

## 2013-12-11 ENCOUNTER — Telehealth: Payer: Self-pay | Admitting: Internal Medicine

## 2013-12-11 ENCOUNTER — Encounter: Payer: Self-pay | Admitting: Internal Medicine

## 2013-12-11 ENCOUNTER — Other Ambulatory Visit (HOSPITAL_BASED_OUTPATIENT_CLINIC_OR_DEPARTMENT_OTHER): Payer: No Typology Code available for payment source

## 2013-12-11 ENCOUNTER — Ambulatory Visit (HOSPITAL_BASED_OUTPATIENT_CLINIC_OR_DEPARTMENT_OTHER): Payer: No Typology Code available for payment source | Admitting: Internal Medicine

## 2013-12-11 VITALS — BP 156/72 | HR 83 | Temp 97.9°F | Resp 19 | Ht 66.0 in | Wt 202.1 lb

## 2013-12-11 DIAGNOSIS — R21 Rash and other nonspecific skin eruption: Secondary | ICD-10-CM

## 2013-12-11 DIAGNOSIS — C3412 Malignant neoplasm of upper lobe, left bronchus or lung: Secondary | ICD-10-CM

## 2013-12-11 DIAGNOSIS — C3410 Malignant neoplasm of upper lobe, unspecified bronchus or lung: Secondary | ICD-10-CM

## 2013-12-11 DIAGNOSIS — C787 Secondary malignant neoplasm of liver and intrahepatic bile duct: Secondary | ICD-10-CM

## 2013-12-11 DIAGNOSIS — R197 Diarrhea, unspecified: Secondary | ICD-10-CM

## 2013-12-11 LAB — COMPREHENSIVE METABOLIC PANEL (CC13)
ALK PHOS: 82 U/L (ref 40–150)
ALT: 19 U/L (ref 0–55)
AST: 22 U/L (ref 5–34)
Albumin: 4 g/dL (ref 3.5–5.0)
Anion Gap: 10 mEq/L (ref 3–11)
BUN: 11 mg/dL (ref 7.0–26.0)
CO2: 24 mEq/L (ref 22–29)
Calcium: 9.7 mg/dL (ref 8.4–10.4)
Chloride: 105 mEq/L (ref 98–109)
Creatinine: 0.9 mg/dL (ref 0.6–1.1)
GLUCOSE: 112 mg/dL (ref 70–140)
Potassium: 3.6 mEq/L (ref 3.5–5.1)
Sodium: 139 mEq/L (ref 136–145)
Total Bilirubin: 1.03 mg/dL (ref 0.20–1.20)
Total Protein: 7.9 g/dL (ref 6.4–8.3)

## 2013-12-11 LAB — CBC WITH DIFFERENTIAL/PLATELET
BASO%: 0.6 % (ref 0.0–2.0)
BASOS ABS: 0.1 10*3/uL (ref 0.0–0.1)
EOS%: 2.7 % (ref 0.0–7.0)
Eosinophils Absolute: 0.3 10*3/uL (ref 0.0–0.5)
HCT: 39.9 % (ref 34.8–46.6)
HGB: 13.1 g/dL (ref 11.6–15.9)
LYMPH%: 24.9 % (ref 14.0–49.7)
MCH: 28.5 pg (ref 25.1–34.0)
MCHC: 32.9 g/dL (ref 31.5–36.0)
MCV: 86.4 fL (ref 79.5–101.0)
MONO#: 0.5 10*3/uL (ref 0.1–0.9)
MONO%: 4.2 % (ref 0.0–14.0)
NEUT#: 7.6 10*3/uL — ABNORMAL HIGH (ref 1.5–6.5)
NEUT%: 67.6 % (ref 38.4–76.8)
Platelets: 301 10*3/uL (ref 145–400)
RBC: 4.62 10*6/uL (ref 3.70–5.45)
RDW: 14 % (ref 11.2–14.5)
WBC: 11.3 10*3/uL — ABNORMAL HIGH (ref 3.9–10.3)
lymph#: 2.8 10*3/uL (ref 0.9–3.3)

## 2013-12-11 MED ORDER — CLINDAMYCIN PHOSPHATE 1 % EX LOTN
TOPICAL_LOTION | Freq: Two times a day (BID) | CUTANEOUS | Status: DC
Start: 2013-12-11 — End: 2014-02-20

## 2013-12-11 MED ORDER — DOXYCYCLINE HYCLATE 100 MG PO TABS: 100.0000 mg | ORAL_TABLET | Freq: Two times a day (BID) | ORAL | Status: DC

## 2013-12-11 NOTE — Progress Notes (Signed)
[place] [phone]   [phone]  OFFICE PROGRESS NOTE  Default, Provider, MD No address on file  DIAGNOSIS: Stage IV (T1b, N3, M1b) non-small cell lung cancer, adenocarcinoma with positive EGFR mutation in exon 21 (L858R) presented with left upper lobe lung mass in addition to left hilar and bilateral mediastinal lymphadenopathy as well as liver metastasis diagnosed in May of 2015.  PRIOR THERAPY: None  CURRENT THERAPY: Tarceva 150 mg by mouth daily. Started 11/25/2013.  INTERVAL HISTORY: Debra Hodges 63 y.o. female returns to the clinic today for followup visit accompanied by her husband. The patient is feeling fine today and tolerating her treatment with oral Tarceva fairly well except for grade 2 skin rash on the face, chest and upper back. She also has few episodes of diarrhea but no more than once a day. She denied having any significant chest pain but has mild shortness breath with exertion with no hemoptysis. She denied having any significant weight loss or night sweats. She has no nausea or vomiting.   MEDICAL HISTORY: Past Medical History  Diagnosis Date  . High blood pressure     ALLERGIES:  is allergic to penicillins.  MEDICATIONS:  Current Outpatient Prescriptions  Medication Sig Dispense Refill  . calcium carbonate (OS-CAL) 600 MG TABS tablet Take 600 mg by mouth daily.       Marland Kitchen CLIMARA 0.1 MG/24HR patch 1 patch once a week.      . diclofenac (VOLTAREN) 75 MG EC tablet Take 75 mg by mouth 2 (two) times daily as needed.      Marland Kitchen levothyroxine (SYNTHROID, LEVOTHROID) 25 MCG tablet Take 25 mcg by mouth daily before breakfast.      . loperamide (IMODIUM) 2 MG capsule Take by mouth as needed for diarrhea or loose stools.      . Multiple Vitamins-Calcium (ONE-A-DAY WOMENS PO) Take 1 tablet by mouth daily.      . potassium chloride SA (K-DUR,KLOR-CON) 20 MEQ tablet 1 tablet 2 (two) times daily.      Marland Kitchen spironolactone (ALDACTONE) 25  MG tablet 1 tablet daily.      . vitamin C (ASCORBIC ACID) 500 MG tablet Take 500 mg by mouth daily.      Marland Kitchen erlotinib (TARCEVA) 150 MG tablet Take 1 tablet (150 mg total) by mouth daily. Take on an empty stomach 1 hour before meals or 2 hours after.  30 tablet  2  . HYDROcodone-homatropine (HYCODAN) 5-1.5 MG/5ML syrup Take 5 mLs by mouth every 6 (six) hours as needed for cough.  240 mL  0  . metroNIDAZOLE (METROCREAM) 0.75 % cream Apply 1 application topically daily.       No current facility-administered medications for this visit.    SURGICAL HISTORY:  Past Surgical History  Procedure Laterality Date  . Abdominal hysterectomy    . Breast biopsy      Right breast  . Video bronchoscopy Bilateral 10/27/2013    Procedure: VIDEO BRONCHOSCOPY WITH FLUORO;  Surgeon: Collene Gobble, MD;  Location: [facility];  Service: Cardiopulmonary;  Laterality: Bilateral;    REVIEW OF SYSTEMS:  A comprehensive review of systems was negative except for: Gastrointestinal: positive for diarrhea Integument/breast: positive for rash   PHYSICAL EXAMINATION: General appearance: alert, cooperative and no distress Head: Normocephalic, without obvious abnormality, atraumatic Neck: no adenopathy, no JVD, supple, symmetrical, trachea midline and thyroid not enlarged, symmetric, no tenderness/mass/nodules Lymph nodes: Cervical, supraclavicular, and axillary nodes normal. Resp: clear to auscultation  bilaterally Back: symmetric, no curvature. ROM normal. No CVA tenderness. Cardio: regular rate and rhythm, S1, S2 normal, no murmur, click, rub or gallop GI: soft, non-tender; bowel sounds normal; no masses,  no organomegaly Extremities: extremities normal, atraumatic, no cyanosis or edema Neurologic: Alert and oriented X 3, normal strength and tone. Normal symmetric reflexes. Normal coordination and gait Skin exam showed 2 maculopapular skin rash on the face and upper chest.  ECOG PERFORMANCE STATUS: 1 - Symptomatic  but completely ambulatory  Blood pressure 156/72, pulse 83, temperature 97.9 F (36.6 C), temperature source Oral, resp. rate 19, height _0  (1.676 m), weight 202 lb 1.6 oz (91.672 kg), SpO2 100.00%.  LABORATORY DATA: Lab Results  Component Value Date   WBC 11.3* 12/11/2013   HGB 13.1 12/11/2013   HCT 39.9 12/11/2013   MCV 86.4 12/11/2013   PLT 301 12/11/2013      Chemistry      Component Value Date/Time   NA 139 12/11/2013 1215   K 3.6 12/11/2013 1215   CO2 24 12/11/2013 1215   BUN 11.0 12/11/2013 1215   CREATININE 0.9 12/11/2013 1215   CREATININE 0.71 11/06/2013 1813      Component Value Date/Time   CALCIUM 9.7 12/11/2013 1215   ALKPHOS 82 12/11/2013 1215   AST 22 12/11/2013 1215   ALT 19 12/11/2013 1215   BILITOT 1.03 12/11/2013 1215       RADIOGRAPHIC STUDIES:  ASSESSMENT AND PLAN: This is a very pleasant 63 years old white female recently diagnosed with stage IV non-small cell lung cancer, adenocarcinoma with positive EGFR mutation in exon 21 (L858R). She is currently on treatment with Tarceva 150 mg by mouth daily status post 2 weeks of treatment and tolerating it fairly well except for the neck to a skin rash. I recommended for the patient to continue her current treatment was Tarceva with the same dose. For the skin rash, I will start the patient on clindamycin 1% lotion to be applied 2-3 times a day to the skin rash area. I also started him on doxycycline 100 mg by mouth twice a day for 10 days. For diarrhea, she was advised to take Imodium as needed. She would come back for followup visit in 2 weeks for reevaluation with repeat CBC and comprehensive metabolic panel. She was advised to call immediately if she has any concerning symptoms in the interval. The patient voices understanding of current disease status and treatment options and is in agreement with the current care plan.  All questions were answered. The patient knows to call the clinic with any problems, questions  or concerns. We can certainly see the patient much sooner if necessary.  Disclaimer: This note was dictated with voice recognition software. Similar sounding words can inadvertently be transcribed and may not be corrected upon review.

## 2013-12-11 NOTE — Telephone Encounter (Signed)
Pt confirmed labs/ov per 07/16 POF, gave pt AVS......KJ

## 2013-12-15 ENCOUNTER — Telehealth: Payer: Self-pay | Admitting: Internal Medicine

## 2013-12-15 NOTE — Telephone Encounter (Signed)
Lft msg for pt confirming of changed schedule due to no availailbity, mailed updated schedule to pt.Marland Kitchen..KJ

## 2013-12-16 ENCOUNTER — Other Ambulatory Visit: Payer: Self-pay | Admitting: *Deleted

## 2013-12-16 DIAGNOSIS — C3412 Malignant neoplasm of upper lobe, left bronchus or lung: Secondary | ICD-10-CM

## 2013-12-29 ENCOUNTER — Other Ambulatory Visit: Payer: No Typology Code available for payment source

## 2013-12-29 ENCOUNTER — Ambulatory Visit: Payer: No Typology Code available for payment source | Admitting: Internal Medicine

## 2013-12-30 ENCOUNTER — Other Ambulatory Visit: Payer: No Typology Code available for payment source

## 2013-12-30 ENCOUNTER — Ambulatory Visit: Payer: Self-pay | Admitting: Internal Medicine

## 2013-12-30 NOTE — Progress Notes (Signed)
This encounter was created in error - please disregard.

## 2013-12-31 ENCOUNTER — Telehealth: Payer: Self-pay | Admitting: Internal Medicine

## 2013-12-31 NOTE — Telephone Encounter (Signed)
pt called to r/s appt..done...pt aware of new d.t °

## 2014-01-07 ENCOUNTER — Telehealth: Payer: Self-pay | Admitting: *Deleted

## 2014-01-07 NOTE — Telephone Encounter (Signed)
Call information shared with Awilda Metro PA-C.  No new orders except to notify OB/GYN about the patches and PCP about ear drainage.  Called patient with this information.

## 2014-01-07 NOTE — Telephone Encounter (Addendum)
Patient called reporting she has been "wearing Climara patches for 15 years and the past two days will not stick and keep falling off.  I do have a Tarceva rash but not where I'm putting the patches".  Also reports "drainage from her ears the past three to four days.  Drainage is yellow green in color and comes and goes".  Denies cuts or abrasions to ear, dizziness, changes in hearing or balance.  Also denies pain to ear or fever.   This nurse instructed her to use an alcohol swab or cotton ball to clean skin to remove all oils from area to apply the patch.  Fan area dry before applying Climara patches and to contact PCP for ear drainage.  Assured her these are not symptoms from Tarceva.  Will notify providers.

## 2014-01-21 ENCOUNTER — Encounter: Payer: Self-pay | Admitting: Internal Medicine

## 2014-01-21 ENCOUNTER — Ambulatory Visit (HOSPITAL_BASED_OUTPATIENT_CLINIC_OR_DEPARTMENT_OTHER): Payer: No Typology Code available for payment source | Admitting: Internal Medicine

## 2014-01-21 ENCOUNTER — Other Ambulatory Visit (HOSPITAL_BASED_OUTPATIENT_CLINIC_OR_DEPARTMENT_OTHER): Payer: No Typology Code available for payment source

## 2014-01-21 ENCOUNTER — Telehealth: Payer: Self-pay | Admitting: Internal Medicine

## 2014-01-21 VITALS — BP 140/59 | HR 85 | Temp 98.0°F | Resp 18 | Ht 66.0 in | Wt 199.6 lb

## 2014-01-21 DIAGNOSIS — C3410 Malignant neoplasm of upper lobe, unspecified bronchus or lung: Secondary | ICD-10-CM

## 2014-01-21 DIAGNOSIS — R21 Rash and other nonspecific skin eruption: Secondary | ICD-10-CM

## 2014-01-21 DIAGNOSIS — R197 Diarrhea, unspecified: Secondary | ICD-10-CM

## 2014-01-21 DIAGNOSIS — C3412 Malignant neoplasm of upper lobe, left bronchus or lung: Secondary | ICD-10-CM

## 2014-01-21 DIAGNOSIS — C787 Secondary malignant neoplasm of liver and intrahepatic bile duct: Secondary | ICD-10-CM

## 2014-01-21 LAB — CBC WITH DIFFERENTIAL/PLATELET
BASO%: 0.4 % (ref 0.0–2.0)
BASOS ABS: 0 10*3/uL (ref 0.0–0.1)
EOS ABS: 0.4 10*3/uL (ref 0.0–0.5)
EOS%: 4.5 % (ref 0.0–7.0)
HEMATOCRIT: 38.1 % (ref 34.8–46.6)
HEMOGLOBIN: 12.9 g/dL (ref 11.6–15.9)
LYMPH%: 28.5 % (ref 14.0–49.7)
MCH: 29.3 pg (ref 25.1–34.0)
MCHC: 33.8 g/dL (ref 31.5–36.0)
MCV: 86.8 fL (ref 79.5–101.0)
MONO#: 0.6 10*3/uL (ref 0.1–0.9)
MONO%: 6.3 % (ref 0.0–14.0)
NEUT#: 5.4 10*3/uL (ref 1.5–6.5)
NEUT%: 60.3 % (ref 38.4–76.8)
Platelets: 295 10*3/uL (ref 145–400)
RBC: 4.39 10*6/uL (ref 3.70–5.45)
RDW: 14.5 % (ref 11.2–14.5)
WBC: 8.9 10*3/uL (ref 3.9–10.3)
lymph#: 2.5 10*3/uL (ref 0.9–3.3)

## 2014-01-21 LAB — COMPREHENSIVE METABOLIC PANEL (CC13)
ALK PHOS: 79 U/L (ref 40–150)
ALT: 18 U/L (ref 0–55)
AST: 23 U/L (ref 5–34)
Albumin: 3.7 g/dL (ref 3.5–5.0)
Anion Gap: 9 mEq/L (ref 3–11)
BILIRUBIN TOTAL: 0.94 mg/dL (ref 0.20–1.20)
BUN: 10.8 mg/dL (ref 7.0–26.0)
CO2: 25 mEq/L (ref 22–29)
Calcium: 9.4 mg/dL (ref 8.4–10.4)
Chloride: 107 mEq/L (ref 98–109)
Creatinine: 0.8 mg/dL (ref 0.6–1.1)
GLUCOSE: 89 mg/dL (ref 70–140)
Potassium: 3.6 mEq/L (ref 3.5–5.1)
Sodium: 141 mEq/L (ref 136–145)
Total Protein: 7.6 g/dL (ref 6.4–8.3)

## 2014-01-21 NOTE — Progress Notes (Signed)
[place] [phone]   Fax:([phone]  OFFICE PROGRESS NOTE  Default, Provider, MD No address on file  DIAGNOSIS: Stage IV (T1b, N3, M1b) non-small cell lung cancer, adenocarcinoma with positive EGFR mutation in exon 21 (L858R) presented with left upper lobe lung mass in addition to left hilar and bilateral mediastinal lymphadenopathy as well as liver metastasis diagnosed in May of 2015.  PRIOR THERAPY: None  CURRENT THERAPY: Tarceva 150 mg by mouth daily. Started 11/25/2013.  INTERVAL HISTORY: Debra Hodges 63 y.o. female returns to the clinic today for followup visit accompanied by her husband. The patient is feeling fine today and tolerating her treatment with oral Tarceva fairly well except for grade 2 skin rash on the face, chest and upper back. She also has few episodes of diarrhea but no more than once a day. The patient also has inflammation of the right big toenail and she applied Neosporin to that area. She also has more wax in her ears. She denied having any significant chest pain but has mild shortness breath with exertion with no hemoptysis. She denied having any significant weight loss or night sweats. She has no nausea or vomiting.   MEDICAL HISTORY: Past Medical History  Diagnosis Date  . High blood pressure     ALLERGIES:  is allergic to penicillins.  MEDICATIONS:  Current Outpatient Prescriptions  Medication Sig Dispense Refill  . calcium carbonate (OS-CAL) 600 MG TABS tablet Take 600 mg by mouth daily.       . clindamycin (CLEOCIN T) 1 % lotion Apply topically 2 (two) times daily.  60 mL  0  . erlotinib (TARCEVA) 150 MG tablet Take 1 tablet (150 mg total) by mouth daily. Take on an empty stomach 1 hour before meals or 2 hours after.  30 tablet  2  . levothyroxine (SYNTHROID, LEVOTHROID) 25 MCG tablet Take 25 mcg by mouth daily before breakfast.      . Multiple Vitamins-Calcium (ONE-A-DAY WOMENS PO) Take 1 tablet by mouth  daily.      . potassium chloride SA (K-DUR,KLOR-CON) 20 MEQ tablet 1 tablet 2 (two) times daily.      Marland Kitchen spironolactone (ALDACTONE) 25 MG tablet 1 tablet daily.      . vitamin C (ASCORBIC ACID) 500 MG tablet Take 500 mg by mouth daily.      Marland Kitchen CLIMARA 0.1 MG/24HR patch 1 patch once a week.      . diclofenac (VOLTAREN) 75 MG EC tablet Take 75 mg by mouth 2 (two) times daily as needed.      Marland Kitchen HYDROcodone-homatropine (HYCODAN) 5-1.5 MG/5ML syrup Take 5 mLs by mouth every 6 (six) hours as needed for cough.  240 mL  0  . loperamide (IMODIUM) 2 MG capsule Take by mouth as needed for diarrhea or loose stools.      . metroNIDAZOLE (METROCREAM) 0.75 % cream Apply 1 application topically daily.       No current facility-administered medications for this visit.    SURGICAL HISTORY:  Past Surgical History  Procedure Laterality Date  . Abdominal hysterectomy    . Breast biopsy      Right breast  . Video bronchoscopy Bilateral 10/27/2013    Procedure: VIDEO BRONCHOSCOPY WITH FLUORO;  Surgeon: Collene Gobble, MD;  Location: [facility];  Service: Cardiopulmonary;  Laterality: Bilateral;    REVIEW OF SYSTEMS:  A comprehensive review of systems was negative except for: Gastrointestinal: positive for diarrhea Integument/breast: positive for rash  PHYSICAL EXAMINATION: General appearance: alert, cooperative and no distress Head: Normocephalic, without obvious abnormality, atraumatic Neck: no adenopathy, no JVD, supple, symmetrical, trachea midline and thyroid not enlarged, symmetric, no tenderness/mass/nodules Lymph nodes: Cervical, supraclavicular, and axillary nodes normal. Resp: clear to auscultation bilaterally Back: symmetric, no curvature. ROM normal. No CVA tenderness. Cardio: regular rate and rhythm, S1, S2 normal, no murmur, click, rub or gallop GI: soft, non-tender; bowel sounds normal; no masses,  no organomegaly Extremities: extremities normal, atraumatic, no cyanosis or edema Neurologic:  Alert and oriented X 3, normal strength and tone. Normal symmetric reflexes. Normal coordination and gait Skin exam showed 2 maculopapular skin rash on the face and upper chest.  ECOG PERFORMANCE STATUS: 1 - Symptomatic but completely ambulatory  Blood pressure 140/59, pulse 85, temperature 98 F (36.7 C), temperature source Oral, resp. rate 18, height 5' 6" (1.676 m), weight 199 lb 9.6 oz (90.538 kg).  LABORATORY DATA: Lab Results  Component Value Date   WBC 8.9 01/21/2014   HGB 12.9 01/21/2014   HCT 38.1 01/21/2014   MCV 86.8 01/21/2014   PLT 295 01/21/2014      Chemistry      Component Value Date/Time   NA 139 12/11/2013 1215   K 3.6 12/11/2013 1215   CO2 24 12/11/2013 1215   BUN 11.0 12/11/2013 1215   CREATININE 0.9 12/11/2013 1215   CREATININE 0.71 11/06/2013 1813      Component Value Date/Time   CALCIUM 9.7 12/11/2013 1215   ALKPHOS 82 12/11/2013 1215   AST 22 12/11/2013 1215   ALT 19 12/11/2013 1215   BILITOT 1.03 12/11/2013 1215       RADIOGRAPHIC STUDIES:  ASSESSMENT AND PLAN: This is a very pleasant 62 years old white female recently diagnosed with stage IV non-small cell lung cancer, adenocarcinoma with positive EGFR mutation in exon 21 (L858R). She is currently on treatment with Tarceva 150 mg by mouth daily status post 2 months of treatment and tolerating it fairly well except for the skin rash. I recommended for the patient to continue her current treatment with Tarceva with the same dose. For the skin rash,she will continue on clindamycin 1% lotion to be applied 2-3 times a day to the skin rash area. For diarrhea, she was advised to take Imodium as needed. She would come back for followup visit in 4 weeks for reevaluation with repeat CT scan of the chest, abdomen and pelvis for restaging of her disease. She was advised to call immediately if she has any concerning symptoms in the interval. The patient voices understanding of current disease status and treatment options  and is in agreement with the current care plan.  All questions were answered. The patient knows to call the clinic with any problems, questions or concerns. We can certainly see the patient much sooner if necessary.  Disclaimer: This note was dictated with voice recognition software. Similar sounding words can inadvertently be transcribed and may not be corrected upon review.       

## 2014-01-21 NOTE — Telephone Encounter (Signed)
Pt confirmed labs/ov per 08/26 POF, gave pt AVS....KJ

## 2014-02-12 ENCOUNTER — Telehealth: Payer: Self-pay | Admitting: Internal Medicine

## 2014-02-12 NOTE — Telephone Encounter (Signed)
lvm for pt regarding to Sept MD visit moved to 9.25 due to MD on Pal....mailed pt appt sched adn letter

## 2014-02-16 ENCOUNTER — Ambulatory Visit (HOSPITAL_COMMUNITY): Admission: RE | Admit: 2014-02-16 | Payer: No Typology Code available for payment source | Source: Ambulatory Visit

## 2014-02-16 ENCOUNTER — Other Ambulatory Visit (HOSPITAL_BASED_OUTPATIENT_CLINIC_OR_DEPARTMENT_OTHER): Payer: No Typology Code available for payment source

## 2014-02-16 ENCOUNTER — Ambulatory Visit (HOSPITAL_COMMUNITY)
Admission: RE | Admit: 2014-02-16 | Discharge: 2014-02-16 | Disposition: A | Discharge status: Home / Self Care | Payer: No Typology Code available for payment source | Source: Ambulatory Visit | Attending: Internal Medicine | Admitting: Internal Medicine

## 2014-02-16 DIAGNOSIS — R1909 Other intra-abdominal and pelvic swelling, mass and lump: Secondary | ICD-10-CM | POA: Insufficient documentation

## 2014-02-16 DIAGNOSIS — I700 Atherosclerosis of aorta: Secondary | ICD-10-CM | POA: Diagnosis not present

## 2014-02-16 DIAGNOSIS — C787 Secondary malignant neoplasm of liver and intrahepatic bile duct: Secondary | ICD-10-CM

## 2014-02-16 DIAGNOSIS — C3410 Malignant neoplasm of upper lobe, unspecified bronchus or lung: Secondary | ICD-10-CM | POA: Insufficient documentation

## 2014-02-16 DIAGNOSIS — C3412 Malignant neoplasm of upper lobe, left bronchus or lung: Secondary | ICD-10-CM

## 2014-02-16 DIAGNOSIS — R599 Enlarged lymph nodes, unspecified: Secondary | ICD-10-CM | POA: Diagnosis not present

## 2014-02-16 DIAGNOSIS — M169 Osteoarthritis of hip, unspecified: Secondary | ICD-10-CM | POA: Insufficient documentation

## 2014-02-16 DIAGNOSIS — M1610 Unilateral primary osteoarthritis, unspecified hip: Secondary | ICD-10-CM | POA: Diagnosis not present

## 2014-02-16 DIAGNOSIS — M47817 Spondylosis without myelopathy or radiculopathy, lumbosacral region: Secondary | ICD-10-CM | POA: Diagnosis not present

## 2014-02-16 DIAGNOSIS — R918 Other nonspecific abnormal finding of lung field: Secondary | ICD-10-CM | POA: Insufficient documentation

## 2014-02-16 LAB — CBC WITH DIFFERENTIAL/PLATELET
BASO%: 0.3 % (ref 0.0–2.0)
Basophils Absolute: 0 10*3/uL (ref 0.0–0.1)
EOS ABS: 0.3 10*3/uL (ref 0.0–0.5)
EOS%: 2.7 % (ref 0.0–7.0)
HEMATOCRIT: 36.1 % (ref 34.8–46.6)
HGB: 12 g/dL (ref 11.6–15.9)
LYMPH#: 2.3 10*3/uL (ref 0.9–3.3)
LYMPH%: 24.6 % (ref 14.0–49.7)
MCH: 29 pg (ref 25.1–34.0)
MCHC: 33.1 g/dL (ref 31.5–36.0)
MCV: 87.5 fL (ref 79.5–101.0)
MONO#: 0.7 10*3/uL (ref 0.1–0.9)
MONO%: 7.5 % (ref 0.0–14.0)
NEUT#: 6.1 10*3/uL (ref 1.5–6.5)
NEUT%: 64.9 % (ref 38.4–76.8)
PLATELETS: 272 10*3/uL (ref 145–400)
RBC: 4.13 10*6/uL (ref 3.70–5.45)
RDW: 14 % (ref 11.2–14.5)
WBC: 9.5 10*3/uL (ref 3.9–10.3)

## 2014-02-16 LAB — COMPREHENSIVE METABOLIC PANEL (CC13)
ALBUMIN: 3.4 g/dL — AB (ref 3.5–5.0)
ALT: 17 U/L (ref 0–55)
ANION GAP: 11 meq/L (ref 3–11)
AST: 22 U/L (ref 5–34)
Alkaline Phosphatase: 75 U/L (ref 40–150)
BILIRUBIN TOTAL: 0.74 mg/dL (ref 0.20–1.20)
BUN: 10.8 mg/dL (ref 7.0–26.0)
CALCIUM: 8.7 mg/dL (ref 8.4–10.4)
CHLORIDE: 105 meq/L (ref 98–109)
CO2: 24 mEq/L (ref 22–29)
Creatinine: 0.7 mg/dL (ref 0.6–1.1)
GLUCOSE: 87 mg/dL (ref 70–140)
Potassium: 3.8 mEq/L (ref 3.5–5.1)
SODIUM: 140 meq/L (ref 136–145)
TOTAL PROTEIN: 7.2 g/dL (ref 6.4–8.3)

## 2014-02-16 MED ORDER — IOHEXOL 300 MG/ML  SOLN
100.0000 mL | Freq: Once | INTRAMUSCULAR | Status: AC | PRN
  Administered 2014-02-16: 100 mL via INTRAVENOUS

## 2014-02-19 ENCOUNTER — Ambulatory Visit: Payer: No Typology Code available for payment source | Admitting: Internal Medicine

## 2014-02-20 ENCOUNTER — Telehealth: Payer: Self-pay | Admitting: Internal Medicine

## 2014-02-20 ENCOUNTER — Encounter: Payer: Self-pay | Admitting: Internal Medicine

## 2014-02-20 ENCOUNTER — Ambulatory Visit (HOSPITAL_BASED_OUTPATIENT_CLINIC_OR_DEPARTMENT_OTHER): Payer: No Typology Code available for payment source | Admitting: Internal Medicine

## 2014-02-20 VITALS — BP 130/70 | HR 81 | Temp 98.1°F | Resp 20 | Ht 66.0 in | Wt 200.4 lb

## 2014-02-20 DIAGNOSIS — C3412 Malignant neoplasm of upper lobe, left bronchus or lung: Secondary | ICD-10-CM

## 2014-02-20 DIAGNOSIS — R21 Rash and other nonspecific skin eruption: Secondary | ICD-10-CM

## 2014-02-20 DIAGNOSIS — C3410 Malignant neoplasm of upper lobe, unspecified bronchus or lung: Secondary | ICD-10-CM

## 2014-02-20 DIAGNOSIS — R197 Diarrhea, unspecified: Secondary | ICD-10-CM

## 2014-02-20 MED ORDER — CLINDAMYCIN PHOSPHATE 1 % EX LOTN: TOPICAL_LOTION | Freq: Two times a day (BID) | CUTANEOUS | Status: DC

## 2014-02-20 MED ORDER — DOXYCYCLINE HYCLATE 100 MG PO TABS: 100.0000 mg | ORAL_TABLET | Freq: Two times a day (BID) | ORAL | Status: DC

## 2014-02-20 NOTE — Telephone Encounter (Signed)
Pt confirmed labs/ov per 09/25 POF, gave pt AVS......Marland Kitchen  KJ

## 2014-02-20 NOTE — Progress Notes (Signed)
[facility] [phone]   [phone]  OFFICE PROGRESS NOTE  Default, Provider, MD No address on file  DIAGNOSIS: Stage IV (T1b, N3, M1b) non-small cell lung cancer, adenocarcinoma with positive EGFR mutation in exon 21 (L858R) presented with left upper lobe lung mass in addition to left hilar and bilateral mediastinal lymphadenopathy as well as liver metastasis diagnosed in May of 2015.  PRIOR THERAPY: None  CURRENT THERAPY: Tarceva 150 mg by mouth daily. Started 11/25/2013.   INTERVAL HISTORY: Debra Hodges 63 y.o. female returns to the clinic today for followup visit accompanied by her husband. The patient is feeling fine today and tolerating her treatment with oral Tarceva fairly well except for grade 2 skin rash on the face, chest and upper back. She also has few episodes of diarrhea but no more than once a day and she takes Imodium on as-needed basis. The patient also has inflammation of the right big toenail and she applied Neosporin to that area but no significant improvement. She denied having any significant chest pain, shortness of breath, cough or hemoptysis. She denied having any significant weight loss or night sweats. She has no nausea or vomiting. She had repeat CT scan of the chest, abdomen and pelvis performed recently and she is here for evaluation and discussion of her scan results.  MEDICAL HISTORY: Past Medical History  Diagnosis Date  . High blood pressure     ALLERGIES:  is allergic to penicillins.  MEDICATIONS:  Current Outpatient Prescriptions  Medication Sig Dispense Refill  . calcium carbonate (OS-CAL) 600 MG TABS tablet Take 600 mg by mouth daily.       Marland Kitchen CLIMARA 0.1 MG/24HR patch 1 patch once a week.      . clindamycin (CLEOCIN T) 1 % lotion Apply topically 2 (two) times daily.  60 mL  0  . diclofenac (VOLTAREN) 75 MG EC tablet Take 75 mg by mouth 2 (two) times daily as needed.      . erlotinib (TARCEVA) 150 MG  tablet Take 1 tablet (150 mg total) by mouth daily. Take on an empty stomach 1 hour before meals or 2 hours after.  30 tablet  2  . HYDROcodone-homatropine (HYCODAN) 5-1.5 MG/5ML syrup Take 5 mLs by mouth every 6 (six) hours as needed for cough.  240 mL  0  . levothyroxine (SYNTHROID, LEVOTHROID) 25 MCG tablet Take 25 mcg by mouth daily before breakfast.      . loperamide (IMODIUM) 2 MG capsule Take by mouth as needed for diarrhea or loose stools.      . metroNIDAZOLE (METROCREAM) 0.75 % cream Apply 1 application topically daily.      . Multiple Vitamins-Calcium (ONE-A-DAY WOMENS PO) Take 1 tablet by mouth daily.      . potassium chloride SA (K-DUR,KLOR-CON) 20 MEQ tablet 1 tablet 2 (two) times daily.      Marland Kitchen spironolactone (ALDACTONE) 25 MG tablet 1 tablet daily.      . vitamin C (ASCORBIC ACID) 500 MG tablet Take 500 mg by mouth daily.       No current facility-administered medications for this visit.    SURGICAL HISTORY:  Past Surgical History  Procedure Laterality Date  . Abdominal hysterectomy    . Breast biopsy      Right breast  . Video bronchoscopy Bilateral 10/27/2013    Procedure: VIDEO BRONCHOSCOPY WITH FLUORO;  Surgeon: Collene Gobble, MD;  Location: [facility];  Service: Cardiopulmonary;  Laterality: Bilateral;  REVIEW OF SYSTEMS:  Constitutional: negative Eyes: negative Ears, nose, mouth, throat, and face: negative Respiratory: negative Cardiovascular: negative Gastrointestinal: negative Genitourinary:negative Integument/breast: negative Hematologic/lymphatic: negative Musculoskeletal:negative Neurological: negative Behavioral/Psych: negative Endocrine: negative Allergic/Immunologic: negative   PHYSICAL EXAMINATION: General appearance: alert, cooperative and no distress Head: Normocephalic, without obvious abnormality, atraumatic Neck: no adenopathy, no JVD, supple, symmetrical, trachea midline and thyroid not enlarged, symmetric, no  tenderness/mass/nodules Lymph nodes: Cervical, supraclavicular, and axillary nodes normal. Resp: clear to auscultation bilaterally Back: symmetric, no curvature. ROM normal. No CVA tenderness. Cardio: regular rate and rhythm, S1, S2 normal, no murmur, click, rub or gallop GI: soft, non-tender; bowel sounds normal; no masses,  no organomegaly Extremities: extremities normal, atraumatic, no cyanosis or edema Neurologic: Alert and oriented X 3, normal strength and tone. Normal symmetric reflexes. Normal coordination and gait Skin exam showed 2 maculopapular skin rash on the face and upper chest.  ECOG PERFORMANCE STATUS: 1 - Symptomatic but completely ambulatory  Blood pressure 130/70, pulse 81, temperature 98.1 F (36.7 C), temperature source Oral, resp. rate 20, height $RemoveBe'5\' 6"'rUcHKQROH$  (1.676 m), weight 200 lb 6.4 oz (90.901 kg).  LABORATORY DATA: Lab Results  Component Value Date   WBC 9.5 02/16/2014   HGB 12.0 02/16/2014   HCT 36.1 02/16/2014   MCV 87.5 02/16/2014   PLT 272 02/16/2014      Chemistry      Component Value Date/Time   NA 140 02/16/2014 0808   K 3.8 02/16/2014 0808   CO2 24 02/16/2014 0808   BUN 10.8 02/16/2014 0808   CREATININE 0.7 02/16/2014 0808   CREATININE 0.71 11/06/2013 1813      Component Value Date/Time   CALCIUM 8.7 02/16/2014 0808   ALKPHOS 75 02/16/2014 0808   AST 22 02/16/2014 0808   ALT 17 02/16/2014 0808   BILITOT 0.74 02/16/2014 0808       RADIOGRAPHIC STUDIES: Ct Chest W Contrast  02/16/2014   CLINICAL DATA:  Staging examination.  History of lung cancer.  EXAM: CT CHEST, ABDOMEN, AND PELVIS WITH CONTRAST  TECHNIQUE: Multidetector CT imaging of the chest, abdomen and pelvis was performed following the standard protocol during bolus administration of intravenous contrast.  CONTRAST:  166mL OMNIPAQUE IOHEXOL 300 MG/ML  SOLN  COMPARISON:  PET-CT 11/11/2013  FINDINGS: CT CHEST FINDINGS  Re- demonstrated heterogeneous nodule within the right lobe of the thyroid. Interval  decrease in size of right paratracheal lymph node measuring 10 mm, previously 13 mm. While comparison is difficult given differences in technique there appears to be interval decrease in size of 7 mm left hilar lymph node, previously measuring 11 mm. The heart is normal in size. No pericardial effusion. Aorta and main pulmonary artery are normal in caliber.  Interval decrease in size of lingular mass measuring 3.7 x 2.3 cm, previously 4.0 x 3.6 cm. Unchanged 2 mm right middle lobe nodule (image 26; series 4). Previously visualized right lower lobe nodule is less conspicuous on today's evaluation. No pleural effusion or pneumothorax.  CT ABDOMEN AND PELVIS FINDINGS  There are a few low-attenuation lesions within the liver. Within the right hepatic lobe there is a 0.7 cm low-attenuation lesion (image 53; series 2) which appears to have decreased in size from prior were it measured 1.8 cm. Additionally there is a 5 mm low-attenuation lesion within the hepatic dome which appears stable compared to prior exam.  Unchanged 7 mm low-attenuation lesion within the spleen. Pancreas is unremarkable. Normal bilateral adrenal glands. Kidneys enhance symmetrically with contrast. 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  Normal caliber abdominal aorta with scattered calcified plaque. No retroperitoneal lymphadenopathy. Urinary bladder is decompressed.  Stool is present throughout the colon. No abnormal bowel wall thickening or evidence for bowel obstruction. No free fluid or free intraperitoneal air.  Left hip joint degenerative change. Lower lumbar spine degenerative change. No aggressive or acute appearing osseous lesions.  IMPRESSION: Interval decrease in size of lingular mass. Interval decrease in size of mediastinal and left hilar lymphadenopathy.  Apparent interval decrease in size of hepatic metastatic disease.  Re- demonstrated scattered pulmonary  nodules, attention on followup.   Electronically Signed   By: Lovey Newcomer M.D.   On: 02/16/2014 11:52   Ct Abdomen Pelvis W Contrast  02/16/2014   CLINICAL DATA:  Staging examination.  History of lung cancer.  EXAM: CT CHEST, ABDOMEN, AND PELVIS WITH CONTRAST  TECHNIQUE: Multidetector CT imaging of the chest, abdomen and pelvis was performed following the standard protocol during bolus administration of intravenous contrast.  CONTRAST:  120mL OMNIPAQUE IOHEXOL 300 MG/ML  SOLN  COMPARISON:  PET-CT 11/11/2013  FINDINGS: CT CHEST FINDINGS  Re- demonstrated heterogeneous nodule within the right lobe of the thyroid. Interval decrease in size of right paratracheal lymph node measuring 10 mm, previously 13 mm. While comparison is difficult given differences in technique there appears to be interval decrease in size of 7 mm left hilar lymph node, previously measuring 11 mm. The heart is normal in size. No pericardial effusion. Aorta and main pulmonary artery are normal in caliber.  Interval decrease in size of lingular mass measuring 3.7 x 2.3 cm, previously 4.0 x 3.6 cm. Unchanged 2 mm right middle lobe nodule (image 26; series 4). Previously visualized right lower lobe nodule is less conspicuous on today's evaluation. No pleural effusion or pneumothorax.  CT ABDOMEN AND PELVIS FINDINGS  There are a few low-attenuation lesions within the liver. Within the right hepatic lobe there is a 0.7 cm low-attenuation lesion (image 53; series 2) which appears to have decreased in size from prior were it measured 1.8 cm. Additionally there is a 5 mm low-attenuation lesion within the hepatic dome which appears stable compared to prior exam.  Unchanged 7 mm low-attenuation lesion within the spleen. Pancreas is unremarkable. Normal bilateral adrenal glands. Kidneys enhance symmetrically with contrast. Subcentimeter fat containing lesion within the inferior pole of the left kidney most compatible with angiomyolipoma.  Normal caliber  abdominal aorta with scattered calcified plaque. No retroperitoneal lymphadenopathy. Urinary bladder is decompressed.  Stool is present throughout the colon. No abnormal bowel wall thickening or evidence for bowel obstruction. No free fluid or free intraperitoneal air.  Left hip joint degenerative change. Lower lumbar spine degenerative change. No aggressive or acute appearing osseous lesions.  IMPRESSION: Interval decrease in size of lingular mass. Interval decrease in size of mediastinal and left hilar lymphadenopathy.  Apparent interval decrease in size of hepatic metastatic disease.  Re- demonstrated scattered pulmonary nodules, attention on followup.   Electronically Signed   By: Lovey Newcomer M.D.   On: 02/16/2014 11:52   ASSESSMENT AND PLAN: This is a very pleasant 63 years old white female recently diagnosed with stage IV non-small cell lung cancer, adenocarcinoma with positive EGFR mutation in exon 21 (L858R). She is currently on treatment with Tarceva 150 mg by mouth daily status post 3 months of treatment and tolerating it fairly well except for the skin rash and occasional diarrhea. The recent CT  scan of the chest, abdomen and pelvis showed improvement in her disease. I discussed the scan results with the patient and her husband. I recommended for the patient to continue her current treatment with Tarceva with the same dose. For the skin rash,she will continue on clindamycin 1% lotion to be applied 2-3 times a day to the skin rash area. I would also start the patient on doxycycline 100 mg by mouth twice a day for 10 days for treatment of the paronychia of the toes. For diarrhea, she was advised to take Imodium as needed. She would come back for followup visit in 4 weeks for reevaluation. She was advised to call immediately if she has any concerning symptoms in the interval. The patient voices understanding of current disease status and treatment options and is in agreement with the current care  plan.  All questions were answered. The patient knows to call the clinic with any problems, questions or concerns. We can certainly see the patient much sooner if necessary.  Disclaimer: This note was dictated with voice recognition software. Similar sounding words can inadvertently be transcribed and may not be corrected upon review.

## 2014-02-25 ENCOUNTER — Telehealth: Payer: Self-pay | Admitting: *Deleted

## 2014-02-25 ENCOUNTER — Telehealth: Payer: Self-pay | Admitting: Medical Oncology

## 2014-02-25 DIAGNOSIS — C3412 Malignant neoplasm of upper lobe, left bronchus or lung: Secondary | ICD-10-CM

## 2014-02-25 MED ORDER — ERLOTINIB HCL 150 MG PO TABS: 150.0000 mg | ORAL_TABLET | Freq: Every day | ORAL | Status: DC

## 2014-02-25 NOTE — Telephone Encounter (Signed)
Patient called and left me a voice message regarding needing re-ordering of Tarceva.  I spoke with Dr. Worthy Flank nurse today, Diane.  She stated she would work on re-ordering.  I called patient back and left a voice message with the above information.

## 2014-02-25 NOTE — Telephone Encounter (Signed)
Tarceva refill sent to [facility]. They received order .

## 2014-02-27 ENCOUNTER — Telehealth: Payer: Self-pay | Admitting: Medical Oncology

## 2014-02-27 NOTE — Telephone Encounter (Signed)
Pt called and reported she called her insurance and they told her she needed prior auth. I called [employer] and requested the prior auth faxed. Approved for 6 months. Holland Falling will call pt now.

## 2014-03-03 ENCOUNTER — Telehealth: Payer: Self-pay | Admitting: Medical Oncology

## 2014-03-03 NOTE — Telephone Encounter (Signed)
I faxed prior auth form to [employer] with EGFR document

## 2014-03-04 ENCOUNTER — Telehealth: Payer: Self-pay | Admitting: *Deleted

## 2014-03-04 NOTE — Telephone Encounter (Signed)
Authorization for tarceva has been approved 02/27/14 - 08/29/2014

## 2014-03-26 ENCOUNTER — Other Ambulatory Visit: Payer: Self-pay | Admitting: Medical Oncology

## 2014-03-26 ENCOUNTER — Other Ambulatory Visit (HOSPITAL_BASED_OUTPATIENT_CLINIC_OR_DEPARTMENT_OTHER): Payer: No Typology Code available for payment source

## 2014-03-26 ENCOUNTER — Encounter: Payer: Self-pay | Admitting: Internal Medicine

## 2014-03-26 ENCOUNTER — Telehealth: Payer: Self-pay | Admitting: Internal Medicine

## 2014-03-26 ENCOUNTER — Other Ambulatory Visit: Payer: Self-pay | Admitting: Internal Medicine

## 2014-03-26 ENCOUNTER — Ambulatory Visit (HOSPITAL_BASED_OUTPATIENT_CLINIC_OR_DEPARTMENT_OTHER): Payer: No Typology Code available for payment source | Admitting: Internal Medicine

## 2014-03-26 VITALS — BP 128/55 | HR 76 | Temp 97.8°F | Resp 18 | Ht 66.0 in | Wt 199.6 lb

## 2014-03-26 DIAGNOSIS — C3412 Malignant neoplasm of upper lobe, left bronchus or lung: Secondary | ICD-10-CM

## 2014-03-26 LAB — CBC WITH DIFFERENTIAL/PLATELET
BASO%: 0 % (ref 0.0–2.0)
Basophils Absolute: 0 10*3/uL (ref 0.0–0.1)
EOS ABS: 0.3 10*3/uL (ref 0.0–0.5)
EOS%: 3 % (ref 0.0–7.0)
HCT: 34.2 % — ABNORMAL LOW (ref 34.8–46.6)
HGB: 11.5 g/dL — ABNORMAL LOW (ref 11.6–15.9)
LYMPH#: 2.4 10*3/uL (ref 0.9–3.3)
LYMPH%: 23 % (ref 14.0–49.7)
MCH: 29.6 pg (ref 25.1–34.0)
MCHC: 33.6 g/dL (ref 31.5–36.0)
MCV: 87.9 fL (ref 79.5–101.0)
MONO#: 0.6 10*3/uL (ref 0.1–0.9)
MONO%: 5.4 % (ref 0.0–14.0)
NEUT%: 68.6 % (ref 38.4–76.8)
NEUTROS ABS: 7.2 10*3/uL — AB (ref 1.5–6.5)
Platelets: 248 10*3/uL (ref 145–400)
RBC: 3.89 10*6/uL (ref 3.70–5.45)
RDW: 13.6 % (ref 11.2–14.5)
WBC: 10.4 10*3/uL — ABNORMAL HIGH (ref 3.9–10.3)

## 2014-03-26 LAB — COMPREHENSIVE METABOLIC PANEL (CC13)
ALBUMIN: 3.4 g/dL — AB (ref 3.5–5.0)
ALT: 17 U/L (ref 0–55)
AST: 20 U/L (ref 5–34)
Alkaline Phosphatase: 81 U/L (ref 40–150)
Anion Gap: 8 mEq/L (ref 3–11)
BUN: 10.4 mg/dL (ref 7.0–26.0)
CALCIUM: 9 mg/dL (ref 8.4–10.4)
CHLORIDE: 107 meq/L (ref 98–109)
CO2: 25 mEq/L (ref 22–29)
Creatinine: 0.8 mg/dL (ref 0.6–1.1)
GLUCOSE: 85 mg/dL (ref 70–140)
Potassium: 3.6 mEq/L (ref 3.5–5.1)
SODIUM: 139 meq/L (ref 136–145)
Total Bilirubin: 1.16 mg/dL (ref 0.20–1.20)
Total Protein: 6.8 g/dL (ref 6.4–8.3)

## 2014-03-26 MED ORDER — SILVER NITRATE-POT NITRATE 75-25 % EX MISC: CUTANEOUS | Status: DC

## 2014-03-26 NOTE — Progress Notes (Signed)
[place] Telephone:(336) [phone]   Fax:(336) [phone]  OFFICE PROGRESS NOTE  Default, Provider, MD No address on file  DIAGNOSIS: Stage IV (T1b, N3, M1b) non-small cell lung cancer, adenocarcinoma with positive EGFR mutation in exon 21 (L858R) presented with left upper lobe lung mass in addition to left hilar and bilateral mediastinal lymphadenopathy as well as liver metastasis diagnosed in May of 2015.  PRIOR THERAPY: None  CURRENT THERAPY: Tarceva 150 mg by mouth daily. Started 11/25/2013.   INTERVAL HISTORY: Debra Hodges 63 y.o. female returns to the clinic today for followup visit accompanied by her husband. The patient is feeling fine today and tolerating her treatment with oral Tarceva fairly well except for grade 1 skin rash on the face. She continues to have occasional few episodes of diarrhea, improves with Imodium on as-needed basis. The patient also has evidence for paronychia of view of the toenails and she applied Neosporin to that area but no minimal improvement. She denied having any significant chest pain, shortness of breath, cough or hemoptysis. She denied having any significant weight loss or night sweats. She has no nausea or vomiting.   MEDICAL HISTORY: Past Medical History  Diagnosis Date  . High blood pressure     ALLERGIES:  is allergic to penicillins.  MEDICATIONS:  Current Outpatient Prescriptions  Medication Sig Dispense Refill  . calcium carbonate (OS-CAL) 600 MG TABS tablet Take 600 mg by mouth daily.       Marland Kitchen CLIMARA 0.1 MG/24HR patch 1 patch once a week.      . clindamycin (CLEOCIN T) 1 % lotion Apply topically 2 (two) times daily.  60 mL  0  . doxycycline (VIBRA-TABS) 100 MG tablet Take 1 tablet (100 mg total) by mouth 2 (two) times daily.  20 tablet  0  . erlotinib (TARCEVA) 150 MG tablet Take 1 tablet (150 mg total) by mouth daily. Take on an empty stomach 1 hour before meals or 2 hours after.  30 tablet  2  . levothyroxine  (SYNTHROID, LEVOTHROID) 25 MCG tablet Take 25 mcg by mouth daily before breakfast.      . loperamide (IMODIUM) 2 MG capsule Take by mouth as needed for diarrhea or loose stools.      . Multiple Vitamins-Calcium (ONE-A-DAY WOMENS PO) Take 1 tablet by mouth daily.      . potassium chloride SA (K-DUR,KLOR-CON) 20 MEQ tablet 1 tablet 2 (two) times daily.      Marland Kitchen spironolactone (ALDACTONE) 25 MG tablet 1 tablet daily.      . vitamin C (ASCORBIC ACID) 500 MG tablet Take 500 mg by mouth daily.      Marland Kitchen acetaminophen (TYLENOL) 325 MG tablet Take 650 mg by mouth every 6 (six) hours as needed.      Marland Kitchen HYDROcodone-homatropine (HYCODAN) 5-1.5 MG/5ML syrup Take 5 mLs by mouth every 6 (six) hours as needed for cough.  240 mL  0  . metroNIDAZOLE (METROCREAM) 0.75 % cream Apply 1 application topically daily.       No current facility-administered medications for this visit.    SURGICAL HISTORY:  Past Surgical History  Procedure Laterality Date  . Abdominal hysterectomy    . Breast biopsy      Right breast  . Video bronchoscopy Bilateral 10/27/2013    Procedure: VIDEO BRONCHOSCOPY WITH FLUORO;  Surgeon: Collene Gobble, MD;  Location: [facility];  Service: Cardiopulmonary;  Laterality: Bilateral;    REVIEW OF SYSTEMS:  Constitutional: negative Eyes:  negative Ears, nose, mouth, throat, and face: negative Respiratory: negative Cardiovascular: negative Gastrointestinal: negative Genitourinary:negative Integument/breast: negative Hematologic/lymphatic: negative Musculoskeletal:negative Neurological: negative Behavioral/Psych: negative Endocrine: negative Allergic/Immunologic: negative   PHYSICAL EXAMINATION: General appearance: alert, cooperative and no distress Head: Normocephalic, without obvious abnormality, atraumatic Neck: no adenopathy, no JVD, supple, symmetrical, trachea midline and thyroid not enlarged, symmetric, no tenderness/mass/nodules Lymph nodes: Cervical, supraclavicular, and axillary  nodes normal. Resp: clear to auscultation bilaterally Back: symmetric, no curvature. ROM normal. No CVA tenderness. Cardio: regular rate and rhythm, S1, S2 normal, no murmur, click, rub or gallop GI: soft, non-tender; bowel sounds normal; no masses,  no organomegaly Extremities: extremities normal, atraumatic, no cyanosis or edema Neurologic: Alert and oriented X 3, normal strength and tone. Normal symmetric reflexes. Normal coordination and gait Skin exam showed 2 maculopapular skin rash on the face and upper chest.  ECOG PERFORMANCE STATUS: 1 - Symptomatic but completely ambulatory  Blood pressure 128/55, pulse 76, temperature 97.8 F (36.6 C), temperature source Oral, resp. rate 18, height _0  (1.676 m), weight 199 lb 9.6 oz (90.538 kg).  LABORATORY DATA: Lab Results  Component Value Date   WBC 10.4* 03/26/2014   HGB 11.5* 03/26/2014   HCT 34.2* 03/26/2014   MCV 87.9 03/26/2014   PLT 248 03/26/2014      Chemistry      Component Value Date/Time   NA 140 02/16/2014 0808   K 3.8 02/16/2014 0808   CO2 24 02/16/2014 0808   BUN 10.8 02/16/2014 0808   CREATININE 0.7 02/16/2014 0808   CREATININE 0.71 11/06/2013 1813      Component Value Date/Time   CALCIUM 8.7 02/16/2014 0808   ALKPHOS 75 02/16/2014 0808   AST 22 02/16/2014 0808   ALT 17 02/16/2014 0808   BILITOT 0.74 02/16/2014 0808       RADIOGRAPHIC STUDIES:  ASSESSMENT AND PLAN: This is a very pleasant 63 years old white female recently diagnosed with stage IV non-small cell lung cancer, adenocarcinoma with positive EGFR mutation in exon 21 (L858R). She is currently on treatment with Tarceva 150 mg by mouth daily status post 3 months of treatment and tolerating it fairly well except for the skin rash and occasional diarrhea as well as paronychia of the toes. I recommended for the patient to continue her current treatment with Tarceva with the same dose. For the skin rash,she will continue on clindamycin 1% lotion to be applied  2-3 times a day to the skin rash area.  for the paronychia she was treated with a course of doxycycline. I would also order silver nitrate swabs to be applied to these areas. For diarrhea, she was advised to take Imodium as needed. She would come back for followup visit in one month for reevaluation. She was advised to call immediately if she has any concerning symptoms in the interval. The patient voices understanding of current disease status and treatment options and is in agreement with the current care plan.  All questions were answered. The patient knows to call the clinic with any problems, questions or concerns. We can certainly see the patient much sooner if necessary.  Disclaimer: This note was dictated with voice recognition software. Similar sounding words can inadvertently be transcribed and may not be corrected upon review.

## 2014-03-26 NOTE — Telephone Encounter (Signed)
Gave avs & cal for Dec. °

## 2014-04-24 ENCOUNTER — Telehealth: Payer: Self-pay | Admitting: *Deleted

## 2014-04-24 NOTE — Telephone Encounter (Signed)
Pt c/o cold symptoms.  No fever.  Wants to know what to take.  Advised what OTC cold medications she can take. She verbalized understanding.

## 2014-04-28 ENCOUNTER — Telehealth: Payer: Self-pay | Admitting: Internal Medicine

## 2014-04-28 ENCOUNTER — Other Ambulatory Visit (HOSPITAL_BASED_OUTPATIENT_CLINIC_OR_DEPARTMENT_OTHER): Payer: No Typology Code available for payment source

## 2014-04-28 ENCOUNTER — Encounter: Payer: Self-pay | Admitting: Internal Medicine

## 2014-04-28 ENCOUNTER — Ambulatory Visit (HOSPITAL_BASED_OUTPATIENT_CLINIC_OR_DEPARTMENT_OTHER): Payer: No Typology Code available for payment source | Admitting: Internal Medicine

## 2014-04-28 VITALS — BP 133/64 | HR 84 | Temp 98.0°F | Resp 18 | Ht 66.0 in | Wt 194.3 lb

## 2014-04-28 DIAGNOSIS — R21 Rash and other nonspecific skin eruption: Secondary | ICD-10-CM

## 2014-04-28 DIAGNOSIS — C3412 Malignant neoplasm of upper lobe, left bronchus or lung: Secondary | ICD-10-CM

## 2014-04-28 DIAGNOSIS — L03032 Cellulitis of left toe: Secondary | ICD-10-CM

## 2014-04-28 DIAGNOSIS — C787 Secondary malignant neoplasm of liver and intrahepatic bile duct: Secondary | ICD-10-CM

## 2014-04-28 DIAGNOSIS — R197 Diarrhea, unspecified: Secondary | ICD-10-CM

## 2014-04-28 LAB — COMPREHENSIVE METABOLIC PANEL (CC13)
ALBUMIN: 3.6 g/dL (ref 3.5–5.0)
ALK PHOS: 76 U/L (ref 40–150)
ALT: 19 U/L (ref 0–55)
AST: 22 U/L (ref 5–34)
Anion Gap: 9 mEq/L (ref 3–11)
BUN: 15.8 mg/dL (ref 7.0–26.0)
CO2: 25 mEq/L (ref 22–29)
Calcium: 9.3 mg/dL (ref 8.4–10.4)
Chloride: 107 mEq/L (ref 98–109)
Creatinine: 0.8 mg/dL (ref 0.6–1.1)
Glucose: 71 mg/dl (ref 70–140)
POTASSIUM: 3.5 meq/L (ref 3.5–5.1)
Sodium: 141 mEq/L (ref 136–145)
TOTAL PROTEIN: 7.1 g/dL (ref 6.4–8.3)
Total Bilirubin: 1.09 mg/dL (ref 0.20–1.20)

## 2014-04-28 LAB — CBC WITH DIFFERENTIAL/PLATELET
BASO%: 0.4 % (ref 0.0–2.0)
Basophils Absolute: 0 10*3/uL (ref 0.0–0.1)
EOS ABS: 0.5 10*3/uL (ref 0.0–0.5)
EOS%: 4.8 % (ref 0.0–7.0)
HCT: 37.2 % (ref 34.8–46.6)
HGB: 12.2 g/dL (ref 11.6–15.9)
LYMPH%: 27.7 % (ref 14.0–49.7)
MCH: 29.3 pg (ref 25.1–34.0)
MCHC: 32.9 g/dL (ref 31.5–36.0)
MCV: 89.2 fL (ref 79.5–101.0)
MONO#: 0.6 10*3/uL (ref 0.1–0.9)
MONO%: 6.4 % (ref 0.0–14.0)
NEUT%: 60.7 % (ref 38.4–76.8)
NEUTROS ABS: 6 10*3/uL (ref 1.5–6.5)
Platelets: 309 10*3/uL (ref 145–400)
RBC: 4.17 10*6/uL (ref 3.70–5.45)
RDW: 13.4 % (ref 11.2–14.5)
WBC: 10 10*3/uL (ref 3.9–10.3)
lymph#: 2.8 10*3/uL (ref 0.9–3.3)

## 2014-04-28 NOTE — Telephone Encounter (Signed)
gv and pirnted appt sched and avs for pt for Dec adn Jan 2016

## 2014-04-28 NOTE — Progress Notes (Signed)
[place] [phone]   Fax:([phone]) [phone]  OFFICE PROGRESS NOTE  Default, Provider, MD No address on file  DIAGNOSIS: Stage IV (T1b, N3, M1b) non-small cell lung cancer, adenocarcinoma with positive EGFR mutation in exon 21 (L858R) presented with left upper lobe lung mass in addition to left hilar and bilateral mediastinal lymphadenopathy as well as liver metastasis diagnosed in May of 2015.  PRIOR THERAPY: None  CURRENT THERAPY: Tarceva 150 mg by mouth daily. Started 11/25/2013.   INTERVAL HISTORY: Debra Hodges 63 y.o. female returns to the clinic today for followup visit accompanied by her husband. The patient is feeling fine today and tolerating her treatment with oral Tarceva fairly well except for mild skin rash on the face and paronychia of the left big toe. She had prescription for silver nitrate swabs but she was worried to apply it to her open wound paronychia lesion. She also has mild chest congestion after having flu recently. She continues to have occasional few episodes of diarrhea, improves with Imodium on as-needed basis. She denied having any significant chest pain, shortness of breath, cough or hemoptysis. She denied having any significant weight loss or night sweats. She has no nausea or vomiting.   MEDICAL HISTORY: Past Medical History  Diagnosis Date  . High blood pressure     ALLERGIES:  is allergic to penicillins.  MEDICATIONS:  Current Outpatient Prescriptions  Medication Sig Dispense Refill  . acetaminophen (TYLENOL) 325 MG tablet Take 650 mg by mouth every 6 (six) hours as needed.    . calcium carbonate (OS-CAL) 600 MG TABS tablet Take 600 mg by mouth daily.     Marland Kitchen CLIMARA 0.1 MG/24HR patch 1 patch once a week.    . clindamycin (CLEOCIN T) 1 % lotion APPLY TO AFFECTED AREA(S) TWICE DAILY. 60 mL 0  . erlotinib (TARCEVA) 150 MG tablet Take 1 tablet (150 mg total) by mouth daily. Take on an empty stomach 1 hour before meals  or 2 hours after. 30 tablet 2  . levothyroxine (SYNTHROID, LEVOTHROID) 25 MCG tablet Take 25 mcg by mouth daily before breakfast.    . loperamide (IMODIUM) 2 MG capsule Take by mouth as needed for diarrhea or loose stools.    . metroNIDAZOLE (METROCREAM) 0.75 % cream Apply 1 application topically daily.    . Multiple Vitamins-Calcium (ONE-A-DAY WOMENS PO) Take 1 tablet by mouth daily.    . potassium chloride SA (K-DUR,KLOR-CON) 20 MEQ tablet 1 tablet 2 (two) times daily.    . silver nitrate applicators 19-14 % applicator Apply topically 3 (three) times a week. 100 each 0  . spironolactone (ALDACTONE) 25 MG tablet 1 tablet daily.    . vitamin C (ASCORBIC ACID) 500 MG tablet Take 500 mg by mouth daily.     No current facility-administered medications for this visit.    SURGICAL HISTORY:  Past Surgical History  Procedure Laterality Date  . Abdominal hysterectomy    . Breast biopsy      Right breast  . Video bronchoscopy Bilateral 10/27/2013    Procedure: VIDEO BRONCHOSCOPY WITH FLUORO;  Surgeon: Collene Gobble, MD;  Location: [facility];  Service: Cardiopulmonary;  Laterality: Bilateral;    REVIEW OF SYSTEMS:  Constitutional: negative Eyes: negative Ears, nose, mouth, throat, and face: negative Respiratory: negative Cardiovascular: negative Gastrointestinal: negative Genitourinary:negative Integument/breast: negative Hematologic/lymphatic: negative Musculoskeletal:negative Neurological: negative Behavioral/Psych: negative Endocrine: negative Allergic/Immunologic: negative   PHYSICAL EXAMINATION: General appearance: alert, cooperative and no distress Head: Normocephalic, without  obvious abnormality, atraumatic Neck: no adenopathy, no JVD, supple, symmetrical, trachea midline and thyroid not enlarged, symmetric, no tenderness/mass/nodules Lymph nodes: Cervical, supraclavicular, and axillary nodes normal. Resp: clear to auscultation bilaterally Back: symmetric, no curvature. ROM  normal. No CVA tenderness. Cardio: regular rate and rhythm, S1, S2 normal, no murmur, click, rub or gallop GI: soft, non-tender; bowel sounds normal; no masses,  no organomegaly Extremities: extremities normal, atraumatic, no cyanosis or edema Neurologic: Alert and oriented X 3, normal strength and tone. Normal symmetric reflexes. Normal coordination and gait Skin exam showed 2 maculopapular skin rash on the face and upper chest.  ECOG PERFORMANCE STATUS: 1 - Symptomatic but completely ambulatory  Blood pressure 133/64, pulse 84, temperature 98 F (36.7 C), temperature source Oral, resp. rate 18, height _0  (1.676 m), weight 194 lb 4.8 oz (88.134 kg).  LABORATORY DATA: Lab Results  Component Value Date   WBC 10.0 04/28/2014   HGB 12.2 04/28/2014   HCT 37.2 04/28/2014   MCV 89.2 04/28/2014   PLT 309 04/28/2014      Chemistry      Component Value Date/Time   NA 139 03/26/2014 0808   K 3.6 03/26/2014 0808   CO2 25 03/26/2014 0808   BUN 10.4 03/26/2014 0808   CREATININE 0.8 03/26/2014 0808   CREATININE 0.71 11/06/2013 1813      Component Value Date/Time   CALCIUM 9.0 03/26/2014 0808   ALKPHOS 81 03/26/2014 0808   AST 20 03/26/2014 0808   ALT 17 03/26/2014 0808   BILITOT 1.16 03/26/2014 0808       RADIOGRAPHIC STUDIES:  ASSESSMENT AND PLAN: This is a very pleasant 63 years old white female recently diagnosed with stage IV non-small cell lung cancer, adenocarcinoma with positive EGFR mutation in exon 21 (L858R). She is currently on treatment with Tarceva 150 mg by mouth daily status post 4 months of treatment and tolerating it fairly well except for the skin rash and occasional diarrhea as well as paronychia of the toes. I recommended for the patient to continue her current treatment with Tarceva with the same dose. For the skin rash,she will continue on clindamycin 1% lotion to be applied 2-3 times a day to the skin rash area. For the paronychia, I recommended for her to  apply silver nitrate swabs to these areas. I may consider reducing her dose of Tarceva 100 mg by mouth daily after the next scan For diarrhea, she was advised to take Imodium as needed. She would come back for followup visit in one month for reevaluation after repeating CT scan of the chest, abdomen and pelvis for restaging of her disease. She was advised to call immediately if she has any concerning symptoms in the interval. The patient voices understanding of current disease status and treatment options and is in agreement with the current care plan.  All questions were answered. The patient knows to call the clinic with any problems, questions or concerns. We can certainly see the patient much sooner if necessary.  Disclaimer: This note was dictated with voice recognition software. Similar sounding words can inadvertently be transcribed and may not be corrected upon review.

## 2014-04-28 NOTE — Telephone Encounter (Signed)
gv adn printed appt sched and avs for pt fo rDEC adn Jan 2016

## 2014-05-26 ENCOUNTER — Ambulatory Visit (HOSPITAL_COMMUNITY)
Admission: RE | Admit: 2014-05-26 | Discharge: 2014-05-26 | Disposition: A | Discharge status: Home / Self Care | Payer: No Typology Code available for payment source | Source: Ambulatory Visit | Attending: Internal Medicine | Admitting: Internal Medicine

## 2014-05-26 ENCOUNTER — Encounter (HOSPITAL_COMMUNITY): Payer: Self-pay

## 2014-05-26 ENCOUNTER — Other Ambulatory Visit (HOSPITAL_BASED_OUTPATIENT_CLINIC_OR_DEPARTMENT_OTHER): Payer: No Typology Code available for payment source

## 2014-05-26 DIAGNOSIS — C787 Secondary malignant neoplasm of liver and intrahepatic bile duct: Secondary | ICD-10-CM | POA: Diagnosis not present

## 2014-05-26 DIAGNOSIS — Z90710 Acquired absence of both cervix and uterus: Secondary | ICD-10-CM | POA: Insufficient documentation

## 2014-05-26 DIAGNOSIS — D1771 Benign lipomatous neoplasm of kidney: Secondary | ICD-10-CM | POA: Insufficient documentation

## 2014-05-26 DIAGNOSIS — C3490 Malignant neoplasm of unspecified part of unspecified bronchus or lung: Secondary | ICD-10-CM | POA: Diagnosis not present

## 2014-05-26 DIAGNOSIS — I700 Atherosclerosis of aorta: Secondary | ICD-10-CM | POA: Diagnosis not present

## 2014-05-26 DIAGNOSIS — E042 Nontoxic multinodular goiter: Secondary | ICD-10-CM | POA: Diagnosis not present

## 2014-05-26 DIAGNOSIS — C3412 Malignant neoplasm of upper lobe, left bronchus or lung: Secondary | ICD-10-CM

## 2014-05-26 LAB — COMPREHENSIVE METABOLIC PANEL (CC13)
ALT: 16 U/L (ref 0–55)
ANION GAP: 9 meq/L (ref 3–11)
AST: 22 U/L (ref 5–34)
Albumin: 3.7 g/dL (ref 3.5–5.0)
Alkaline Phosphatase: 79 U/L (ref 40–150)
BILIRUBIN TOTAL: 1.16 mg/dL (ref 0.20–1.20)
BUN: 13.1 mg/dL (ref 7.0–26.0)
CO2: 24 mEq/L (ref 22–29)
Calcium: 9 mg/dL (ref 8.4–10.4)
Chloride: 105 mEq/L (ref 98–109)
Creatinine: 0.8 mg/dL (ref 0.6–1.1)
EGFR: 82 mL/min/{1.73_m2} — AB (ref >90)
Glucose: 89 mg/dl (ref 70–140)
Potassium: 3.4 mEq/L — ABNORMAL LOW (ref 3.5–5.1)
Sodium: 139 mEq/L (ref 136–145)
Total Protein: 7.1 g/dL (ref 6.4–8.3)

## 2014-05-26 LAB — CBC WITH DIFFERENTIAL/PLATELET
BASO%: 0.1 % (ref 0.0–2.0)
Basophils Absolute: 0 10*3/uL (ref 0.0–0.1)
EOS%: 3.8 % (ref 0.0–7.0)
Eosinophils Absolute: 0.4 10*3/uL (ref 0.0–0.5)
HEMATOCRIT: 37.1 % (ref 34.8–46.6)
HGB: 12.1 g/dL (ref 11.6–15.9)
LYMPH#: 2.8 10*3/uL (ref 0.9–3.3)
LYMPH%: 25.7 % (ref 14.0–49.7)
MCH: 29.2 pg (ref 25.1–34.0)
MCHC: 32.6 g/dL (ref 31.5–36.0)
MCV: 89.4 fL (ref 79.5–101.0)
MONO#: 0.8 10*3/uL (ref 0.1–0.9)
MONO%: 7 % (ref 0.0–14.0)
NEUT#: 6.9 10*3/uL — ABNORMAL HIGH (ref 1.5–6.5)
NEUT%: 63.4 % (ref 38.4–76.8)
Platelets: 288 10*3/uL (ref 145–400)
RBC: 4.15 10*6/uL (ref 3.70–5.45)
RDW: 13.5 % (ref 11.2–14.5)
WBC: 10.9 10*3/uL — ABNORMAL HIGH (ref 3.9–10.3)

## 2014-05-26 MED ORDER — IOHEXOL 300 MG/ML  SOLN
100.0000 mL | Freq: Once | INTRAMUSCULAR | Status: AC | PRN
  Administered 2014-05-26: 100 mL via INTRAVENOUS

## 2014-06-02 ENCOUNTER — Telehealth: Payer: Self-pay | Admitting: Internal Medicine

## 2014-06-02 ENCOUNTER — Encounter: Payer: Self-pay | Admitting: Internal Medicine

## 2014-06-02 ENCOUNTER — Ambulatory Visit (HOSPITAL_BASED_OUTPATIENT_CLINIC_OR_DEPARTMENT_OTHER): Payer: No Typology Code available for payment source | Admitting: Internal Medicine

## 2014-06-02 VITALS — BP 128/63 | HR 87 | Temp 97.8°F | Resp 18 | Ht 66.0 in | Wt 195.4 lb

## 2014-06-02 DIAGNOSIS — R197 Diarrhea, unspecified: Secondary | ICD-10-CM

## 2014-06-02 DIAGNOSIS — C3412 Malignant neoplasm of upper lobe, left bronchus or lung: Secondary | ICD-10-CM

## 2014-06-02 DIAGNOSIS — C787 Secondary malignant neoplasm of liver and intrahepatic bile duct: Secondary | ICD-10-CM

## 2014-06-02 DIAGNOSIS — R21 Rash and other nonspecific skin eruption: Secondary | ICD-10-CM

## 2014-06-02 DIAGNOSIS — L03032 Cellulitis of left toe: Secondary | ICD-10-CM

## 2014-06-02 MED ORDER — ERLOTINIB HCL 100 MG PO TABS: 100.0000 mg | ORAL_TABLET | Freq: Every day | ORAL | Status: DC

## 2014-06-02 NOTE — Telephone Encounter (Signed)
Pt confirmed labs/ov per 01/05 POF, gave pt AVS..... KJ

## 2014-06-02 NOTE — Progress Notes (Signed)
[place] Hod[place] Telephone:([phone]   Fax:(336) [phone]  OFFICE PROGRESS NOTE  Default, Provider, MD No address on file  DIAGNOSIS: Stage IV (T1b, N3, M1b) non-small cell lung cancer, adenocarcinoma with positive EGFR mutation in exon 21 (L858R) presented with left upper lobe lung mass in addition to left hilar and bilateral mediastinal lymphadenopathy as well as liver metastasis diagnosed in May of 2015.  PRIOR THERAPY: Tarceva 150 mg by mouth daily. Started 11/25/2013. Status post 5 months of treatment discontinued today secondary to intolerance with persistent paronychia.  CURRENT THERAPY: Tarceva 050 mg by mouth daily. Started 06/03/2014   INTERVAL HISTORY: Debra Hodges 64 y.o. female returns to the clinic today for followup visit accompanied by her husband. The patient is feeling fine today and tolerating her treatment with oral Tarceva fairly well except for mild skin rash on the face and paronychia of the left big toe that is not responding to the current treatment with doxycycline and clindamycin lotion. The patient was worried about using silver nitrate swabs. She continues to have occasional few episodes of diarrhea, improves with Imodium on as-needed basis. She denied having any significant chest pain, shortness of breath, cough or hemoptysis. She denied having any significant weight loss or night sweats. She has no nausea or vomiting. She had repeat CT scan of the chest, abdomen and pelvis performed recently and she is here for evaluation and discussion of her scan results.  MEDICAL HISTORY: Past Medical History  Diagnosis Date  . High blood pressure   . lung ca dx'd 10/2013    ALLERGIES:  is allergic to penicillins.  MEDICATIONS:  Current Outpatient Prescriptions  Medication Sig Dispense Refill  . acetaminophen (TYLENOL) 325 MG tablet Take 650 mg by mouth every 6 (six) hours as needed.    . calcium carbonate (OS-CAL) 600 MG TABS tablet Take 600 mg by  mouth daily.     Marland Kitchen CLIMARA 0.1 MG/24HR patch 1 patch once a week.    . clindamycin (CLEOCIN T) 1 % lotion APPLY TO AFFECTED AREA(S) TWICE DAILY. 60 mL 0  . erlotinib (TARCEVA) 150 MG tablet Take 1 tablet (150 mg total) by mouth daily. Take on an empty stomach 1 hour before meals or 2 hours after. 30 tablet 2  . levothyroxine (SYNTHROID, LEVOTHROID) 25 MCG tablet Take 25 mcg by mouth daily before breakfast.    . loperamide (IMODIUM) 2 MG capsule Take by mouth as needed for diarrhea or loose stools.    . metroNIDAZOLE (METROCREAM) 0.75 % cream Apply 1 application topically daily.    . Multiple Vitamins-Calcium (ONE-A-DAY WOMENS PO) Take 1 tablet by mouth daily.    . potassium chloride SA (K-DUR,KLOR-CON) 20 MEQ tablet 1 tablet 2 (two) times daily.    . silver nitrate applicators 43-56 % applicator Apply topically 3 (three) times a week. 100 each 0  . spironolactone (ALDACTONE) 25 MG tablet 1 tablet daily.    . vitamin C (ASCORBIC ACID) 500 MG tablet Take 500 mg by mouth daily.     No current facility-administered medications for this visit.    SURGICAL HISTORY:  Past Surgical History  Procedure Laterality Date  . Abdominal hysterectomy    . Breast biopsy      Right breast  . Video bronchoscopy Bilateral 10/27/2013    Procedure: VIDEO BRONCHOSCOPY WITH FLUORO;  Surgeon: Collene Gobble, MD;  Location: [facility];  Service: Cardiopulmonary;  Laterality: Bilateral;    REVIEW OF SYSTEMS:  Constitutional: negative  Eyes: negative Ears, nose, mouth, throat, and face: negative Respiratory: negative Cardiovascular: negative Gastrointestinal: negative Genitourinary:negative Integument/breast: negative Hematologic/lymphatic: negative Musculoskeletal:negative Neurological: negative Behavioral/Psych: negative Endocrine: negative Allergic/Immunologic: negative   PHYSICAL EXAMINATION: General appearance: alert, cooperative and no distress Head: Normocephalic, without obvious abnormality,  atraumatic Neck: no adenopathy, no JVD, supple, symmetrical, trachea midline and thyroid not enlarged, symmetric, no tenderness/mass/nodules Lymph nodes: Cervical, supraclavicular, and axillary nodes normal. Resp: clear to auscultation bilaterally Back: symmetric, no curvature. ROM normal. No CVA tenderness. Cardio: regular rate and rhythm, S1, S2 normal, no murmur, click, rub or gallop GI: soft, non-tender; bowel sounds normal; no masses,  no organomegaly Extremities: extremities normal, atraumatic, no cyanosis or edema and With paronychia involving the left big toe. Neurologic: Alert and oriented X 3, normal strength and tone. Normal symmetric reflexes. Normal coordination and gait Skin exam showed 2 maculopapular skin rash on the face and upper chest.  ECOG PERFORMANCE STATUS: 1 - Symptomatic but completely ambulatory  There were no vitals taken for this visit.  LABORATORY DATA: Lab Results  Component Value Date   WBC 10.9* 05/26/2014   HGB 12.1 05/26/2014   HCT 37.1 05/26/2014   MCV 89.4 05/26/2014   PLT 288 05/26/2014      Chemistry      Component Value Date/Time   NA 139 05/26/2014 0800   K 3.4* 05/26/2014 0800   CO2 24 05/26/2014 0800   BUN 13.1 05/26/2014 0800   CREATININE 0.8 05/26/2014 0800   CREATININE 0.71 11/06/2013 1813      Component Value Date/Time   CALCIUM 9.0 05/26/2014 0800   ALKPHOS 79 05/26/2014 0800   AST 22 05/26/2014 0800   ALT 16 05/26/2014 0800   BILITOT 1.16 05/26/2014 0800       RADIOGRAPHIC STUDIES: Ct Chest W Contrast  05/26/2014   CLINICAL DATA:  Lung cancer diagnosed 6 months ago. Tarceva therapy undergoing.  EXAM: CT CHEST, ABDOMEN, AND PELVIS WITH CONTRAST  TECHNIQUE: Multidetector CT imaging of the chest, abdomen and pelvis was performed following the standard protocol during bolus administration of intravenous contrast.  CONTRAST:  127m OMNIPAQUE IOHEXOL 300 MG/ML  SOLN  COMPARISON:  CTs of the chest, abdomen and pelvis 02/16/2014.  PET-CT 11/11/2013. Chest CT 10/01/2013.  FINDINGS: CT CHEST FINDINGS  Mediastinum: Right paratracheal node measures 6 mm on image 21, previously 10 mm. No residual hilar adenopathy demonstrated. Other small mediastinal lymph nodes are stable. There is stable nodularity of the thyroid gland. The trachea and esophagus demonstrate no significant findings. The heart size is normal. There is no pericardial effusion.There is stable mild atherosclerosis of the aorta great vessels.  Lungs/Pleura: There is no pleural effusion.Because of the irregular shape of the lingular nodule, it is difficult to accurately measure. The lesion appears subjectively slightly smaller, measuring approximately 2.9 x 1.8 cm on image 27. No new or enlarging pulmonary nodules demonstrated. Tiny right upper lobe nodule on image number 13 is stable.  Musculoskeletal/Chest wall: Small axillary lymph nodes bilaterally are stable. No chest wall lesion or suspicious osseous findings demonstrated.  CT ABDOMEN AND PELVIS FINDINGS  Hepatobiliary: The previously demonstrated low-density hepatic lesions likely corresponding with hypermetabolic metastases on PET-CT demonstrate continued regression. A lesion posteriorly in the right hepatic lobe measures 6 mm on image 55 (previously 7 mm). The lesion previously noted in the dome of the liver has nearly completely resolved. No new or enlarging liver lesions seen. No evidence of gallstones, gallbladder wall thickening or biliary dilatation.  Pancreas: Unremarkable. No pancreatic  ductal dilatation or surrounding inflammatory changes.  Spleen: Probable cyst inferiorly in the spleen is stable.  Adrenals/Urinary Tract: Both adrenal glands appear normal.The kidneys demonstrate stable mild cortical lobularity. 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 There is no evidence of ureteral calculus, hydronephrosis or bladder abnormality. The bladder is nearly empty and suboptimally evaluated.   Stomach/Bowel: No evidence of bowel wall thickening, distention or surrounding inflammatory change.There is a possible lipoma within the lumen of the sigmoid colon (image number 107) versus volume averaging with stool.  Vascular/Lymphatic: Small mesenteric and inguinal lymph nodes are stable. There is stable mild aortoiliac atherosclerosis.  Reproductive: Status post hysterectomy. No evidence of adnexal mass.  Other: No evidence of abdominal wall mass or hernia.  Musculoskeletal: No acute or significant osseous findings. There are stable degenerative changes in the spine and left hip.  IMPRESSION: 1. Little change to slight improvement in residual lingular mass. 2. Further improvement in hepatic metastatic disease, nearly completely resolved. No residual hilar or mediastinal adenopathy. 3. No new metastases identified. 4. Stable incidental findings including multiple thyroid nodules, a left renal angiomyolipoma and mild atherosclerosis.   Electronically Signed   By: Camie Patience M.D.   On: 05/26/2014 09:25   Ct Abdomen Pelvis W Contrast  05/26/2014   CLINICAL DATA:  Lung cancer diagnosed 6 months ago. Tarceva therapy undergoing.  EXAM: CT CHEST, ABDOMEN, AND PELVIS WITH CONTRAST  TECHNIQUE: Multidetector CT imaging of the chest, abdomen and pelvis was performed following the standard protocol during bolus administration of intravenous contrast.  CONTRAST:  135m OMNIPAQUE IOHEXOL 300 MG/ML  SOLN  COMPARISON:  CTs of the chest, abdomen and pelvis 02/16/2014. PET-CT 11/11/2013. Chest CT 10/01/2013.  FINDINGS: CT CHEST FINDINGS  Mediastinum: Right paratracheal node measures 6 mm on image 21, previously 10 mm. No residual hilar adenopathy demonstrated. Other small mediastinal lymph nodes are stable. There is stable nodularity of the thyroid gland. The trachea and esophagus demonstrate no significant findings. The heart size is normal. There is no pericardial effusion.There is stable mild atherosclerosis of the aorta  great vessels.  Lungs/Pleura: There is no pleural effusion.Because of the irregular shape of the lingular nodule, it is difficult to accurately measure. The lesion appears subjectively slightly smaller, measuring approximately 2.9 x 1.8 cm on image 27. No new or enlarging pulmonary nodules demonstrated. Tiny right upper lobe nodule on image number 13 is stable.  Musculoskeletal/Chest wall: Small axillary lymph nodes bilaterally are stable. No chest wall lesion or suspicious osseous findings demonstrated.  CT ABDOMEN AND PELVIS FINDINGS  Hepatobiliary: The previously demonstrated low-density hepatic lesions likely corresponding with hypermetabolic metastases on PET-CT demonstrate continued regression. A lesion posteriorly in the right hepatic lobe measures 6 mm on image 55 (previously 7 mm). The lesion previously noted in the dome of the liver has nearly completely resolved. No new or enlarging liver lesions seen. No evidence of gallstones, gallbladder wall thickening or biliary dilatation.  Pancreas: Unremarkable. No pancreatic ductal dilatation or surrounding inflammatory changes.  Spleen: Probable cyst inferiorly in the spleen is stable.  Adrenals/Urinary Tract: Both adrenal glands appear normal.The kidneys demonstrate stable mild cortical lobularity. 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 There is no evidence of ureteral calculus, hydronephrosis or bladder abnormality. The bladder is nearly empty and suboptimally evaluated.  Stomach/Bowel: No evidence of bowel wall thickening, distention or surrounding inflammatory change.There is a possible lipoma within the lumen of the sigmoid colon (image number  107) versus volume averaging with stool.  Vascular/Lymphatic: Small mesenteric and inguinal lymph nodes are stable. There is stable mild aortoiliac atherosclerosis.  Reproductive: Status post hysterectomy. No evidence of adnexal mass.  Other: No evidence of abdominal wall mass or  hernia.  Musculoskeletal: No acute or significant osseous findings. There are stable degenerative changes in the spine and left hip.  IMPRESSION: 1. Little change to slight improvement in residual lingular mass. 2. Further improvement in hepatic metastatic disease, nearly completely resolved. No residual hilar or mediastinal adenopathy. 3. No new metastases identified. 4. Stable incidental findings including multiple thyroid nodules, a left renal angiomyolipoma and mild atherosclerosis.   Electronically Signed   By: Camie Patience M.D.   On: 05/26/2014 09:25   ASSESSMENT AND PLAN: This is a very pleasant 64 years old white female recently diagnosed with stage IV non-small cell lung cancer, adenocarcinoma with positive EGFR mutation in exon 21 (L858R). She is currently on treatment with Tarceva 150 mg by mouth daily status post 5 months of treatment and tolerating it fairly well except for the skin rash and occasional diarrhea as well as paronychia of the toes. The recent CT scan of the chest, abdomen and pelvis showed continuous improvement in her disease with almost complete resolution of the metastatic liver lesions. I discussed the scan results with the patient and her husband. I recommended for the patient to continue her current treatment with Tarceva but I will reduce the dose of Tarceva to 100 mg by mouth daily starting tomorrow. For the skin rash,she will continue on clindamycin 1% lotion to be applied 2-3 times a day to the skin rash area. For diarrhea, she was advised to take Imodium as needed. She will come back for follow-up visit in one month's for reevaluation with repeat blood work. She was advised to call immediately if she has any concerning symptoms in the interval. The patient voices understanding of current disease status and treatment options and is in agreement with the current care plan.  All questions were answered. The patient knows to call the clinic with any problems, questions  or concerns. We can certainly see the patient much sooner if necessary.  Disclaimer: This note was dictated with voice recognition software. Similar sounding words can inadvertently be transcribed and may not be corrected upon review.

## 2014-07-06 ENCOUNTER — Telehealth: Payer: Self-pay | Admitting: Internal Medicine

## 2014-07-06 ENCOUNTER — Ambulatory Visit (HOSPITAL_BASED_OUTPATIENT_CLINIC_OR_DEPARTMENT_OTHER): Payer: No Typology Code available for payment source | Admitting: Physician Assistant

## 2014-07-06 ENCOUNTER — Encounter: Payer: Self-pay | Admitting: Physician Assistant

## 2014-07-06 ENCOUNTER — Other Ambulatory Visit (HOSPITAL_BASED_OUTPATIENT_CLINIC_OR_DEPARTMENT_OTHER): Payer: No Typology Code available for payment source

## 2014-07-06 VITALS — BP 126/60 | HR 78 | Temp 97.8°F | Resp 18 | Ht 66.0 in | Wt 199.2 lb

## 2014-07-06 DIAGNOSIS — R197 Diarrhea, unspecified: Secondary | ICD-10-CM

## 2014-07-06 DIAGNOSIS — C3412 Malignant neoplasm of upper lobe, left bronchus or lung: Secondary | ICD-10-CM

## 2014-07-06 DIAGNOSIS — L03032 Cellulitis of left toe: Secondary | ICD-10-CM

## 2014-07-06 DIAGNOSIS — C787 Secondary malignant neoplasm of liver and intrahepatic bile duct: Secondary | ICD-10-CM

## 2014-07-06 LAB — COMPREHENSIVE METABOLIC PANEL (CC13)
ALBUMIN: 3.7 g/dL (ref 3.5–5.0)
ALT: 16 U/L (ref 0–55)
ANION GAP: 11 meq/L (ref 3–11)
AST: 20 U/L (ref 5–34)
Alkaline Phosphatase: 86 U/L (ref 40–150)
BUN: 18.6 mg/dL (ref 7.0–26.0)
CO2: 23 meq/L (ref 22–29)
CREATININE: 0.8 mg/dL (ref 0.6–1.1)
Calcium: 9.1 mg/dL (ref 8.4–10.4)
Chloride: 108 mEq/L (ref 98–109)
EGFR: 79 mL/min/{1.73_m2} — ABNORMAL LOW (ref >90)
Glucose: 78 mg/dl (ref 70–140)
POTASSIUM: 3.9 meq/L (ref 3.5–5.1)
Sodium: 141 mEq/L (ref 136–145)
TOTAL PROTEIN: 7.4 g/dL (ref 6.4–8.3)
Total Bilirubin: 0.56 mg/dL (ref 0.20–1.20)

## 2014-07-06 LAB — CBC WITH DIFFERENTIAL/PLATELET
BASO%: 0.2 % (ref 0.0–2.0)
Basophils Absolute: 0 10*3/uL (ref 0.0–0.1)
EOS%: 2.8 % (ref 0.0–7.0)
Eosinophils Absolute: 0.3 10*3/uL (ref 0.0–0.5)
HEMATOCRIT: 37.5 % (ref 34.8–46.6)
HGB: 12.6 g/dL (ref 11.6–15.9)
LYMPH%: 28.3 % (ref 14.0–49.7)
MCH: 30.2 pg (ref 25.1–34.0)
MCHC: 33.5 g/dL (ref 31.5–36.0)
MCV: 90.1 fL (ref 79.5–101.0)
MONO#: 0.6 10*3/uL (ref 0.1–0.9)
MONO%: 6.3 % (ref 0.0–14.0)
NEUT%: 62.4 % (ref 38.4–76.8)
NEUTROS ABS: 6.4 10*3/uL (ref 1.5–6.5)
Platelets: 287 10*3/uL (ref 145–400)
RBC: 4.16 10*6/uL (ref 3.70–5.45)
RDW: 13.1 % (ref 11.2–14.5)
WBC: 10.3 10*3/uL (ref 3.9–10.3)
lymph#: 2.9 10*3/uL (ref 0.9–3.3)

## 2014-07-06 NOTE — Patient Instructions (Signed)
Continue Tarceva 100 milligrams by mouth daily Follow-up in one month

## 2014-07-06 NOTE — Progress Notes (Addendum)
[place] [phone]   Fax:(336) [phone]  OFFICE PROGRESS NOTE  Default, Provider, MD No address on file  DIAGNOSIS: Stage IV (T1b, N3, M1b) non-small cell lung cancer, adenocarcinoma with positive EGFR mutation in exon 21 (L858R) presented with left upper lobe lung mass in addition to left hilar and bilateral mediastinal lymphadenopathy as well as liver metastasis diagnosed in May of 2015.  PRIOR THERAPY: Tarceva 150 mg by mouth daily. Started 11/25/2013. Status post 5 months of treatment discontinued today secondary to intolerance with persistent paronychia.  CURRENT THERAPY: Tarceva 100 mg by mouth daily. Started 06/03/2014. Status post 1 month of treatment   INTERVAL HISTORY: Daine Croker 64 y.o. female returns to the clinic today for followup visit accompanied by her husband. The patient is feeling fine today. She is tolerating the Tarceva at 100 mg by mouth daily much better. She's had a few episodes of diarrhea and significant improvement in the skin rash. Also the paronychia involving her toenails is slowly improving. She has lost a couple of toenails however they've grown back and in the process she has had decreased pain. She's been using hydrogen peroxide soaks for her toenails. She uses Imodium on an as-needed basis for the diarrhea. She denied having any significant chest pain, shortness of breath, cough or hemoptysis. She denied having any significant weight loss or night sweats. She has no nausea or vomiting.   MEDICAL HISTORY: Past Medical History  Diagnosis Date  . High blood pressure   . lung ca dx'd 10/2013    ALLERGIES:  is allergic to penicillins.  MEDICATIONS:  Current Outpatient Prescriptions  Medication Sig Dispense Refill  . acetaminophen (TYLENOL) 325 MG tablet Take 650 mg by mouth every 6 (six) hours as needed.    . calcium carbonate (OS-CAL) 600 MG TABS tablet Take 600 mg by mouth daily.     Marland Kitchen CLIMARA 0.1 MG/24HR patch  1 patch once a week.    . erlotinib (TARCEVA) 100 MG tablet Take 1 tablet (100 mg total) by mouth daily. Take on an empty stomach 1 hour before meals or 2 hours after. 30 tablet 2  . levothyroxine (SYNTHROID, LEVOTHROID) 25 MCG tablet Take 25 mcg by mouth daily before breakfast.    . loperamide (IMODIUM) 2 MG capsule Take by mouth as needed for diarrhea or loose stools.    . metroNIDAZOLE (METROCREAM) 0.75 % cream Apply 1 application topically daily.    . Multiple Vitamins-Calcium (ONE-A-DAY WOMENS PO) Take 1 tablet by mouth daily.    . potassium chloride SA (K-DUR,KLOR-CON) 20 MEQ tablet 1 tablet 2 (two) times daily.    . silver nitrate applicators 26-20 % applicator Apply topically 3 (three) times a week. 100 each 0  . spironolactone (ALDACTONE) 25 MG tablet 1 tablet daily.    . vitamin C (ASCORBIC ACID) 500 MG tablet Take 500 mg by mouth daily.    . clindamycin (CLEOCIN T) 1 % lotion APPLY TO AFFECTED AREA(S) TWICE DAILY. (Patient not taking: Reported on 07/06/2014) 60 mL 0   No current facility-administered medications for this visit.    SURGICAL HISTORY:  Past Surgical History  Procedure Laterality Date  . Abdominal hysterectomy    . Breast biopsy      Right breast  . Video bronchoscopy Bilateral 10/27/2013    Procedure: VIDEO BRONCHOSCOPY WITH FLUORO;  Surgeon: Collene Gobble, MD;  Location: [facility];  Service: Cardiopulmonary;  Laterality: Bilateral;    REVIEW OF SYSTEMS:  Constitutional: negative Eyes: negative Ears, nose, mouth, throat, and face: negative Respiratory: negative Cardiovascular: negative Gastrointestinal: positive for diarrhea and With very occasional episodes Genitourinary:negative Integument/breast: positive for Paronychia involving the toes however these are improving Hematologic/lymphatic: negative Musculoskeletal:negative Neurological: negative Behavioral/Psych: negative Endocrine: negative Allergic/Immunologic: negative   PHYSICAL EXAMINATION:  General appearance: alert, cooperative and no distress Head: Normocephalic, without obvious abnormality, atraumatic Neck: no adenopathy, no JVD, supple, symmetrical, trachea midline and thyroid not enlarged, symmetric, no tenderness/mass/nodules Lymph nodes: Cervical, supraclavicular, and axillary nodes normal. Resp: clear to auscultation bilaterally Back: symmetric, no curvature. ROM normal. No CVA tenderness. Cardio: regular rate and rhythm, S1, S2 normal, no murmur, click, rub or gallop GI: soft, non-tender; bowel sounds normal; no masses,  no organomegaly Extremities: extremities normal, atraumatic, no cyanosis or edema and With paronychia involving the left big toe. Neurologic: Alert and oriented X 3, normal strength and tone. Normal symmetric reflexes. Normal coordination and gait Skin exam showed minimal to grade 1 maculopapular skin rash on the face and upper chest.  ECOG PERFORMANCE STATUS: 1 - Symptomatic but completely ambulatory  Blood pressure 126/60, pulse 78, temperature 97.8 F (36.6 C), temperature source Oral, resp. rate 18, height _0  (1.676 m), weight 199 lb 3.2 oz (90.357 kg), SpO2 99 %.  LABORATORY DATA: Lab Results  Component Value Date   WBC 10.3 07/06/2014   HGB 12.6 07/06/2014   HCT 37.5 07/06/2014   MCV 90.1 07/06/2014   PLT 287 07/06/2014      Chemistry      Component Value Date/Time   NA 141 07/06/2014 0807   K 3.9 07/06/2014 0807   CO2 23 07/06/2014 0807   BUN 18.6 07/06/2014 0807   CREATININE 0.8 07/06/2014 0807   CREATININE 0.71 11/06/2013 1813      Component Value Date/Time   CALCIUM 9.1 07/06/2014 0807   ALKPHOS 86 07/06/2014 0807   AST 20 07/06/2014 0807   ALT 16 07/06/2014 0807   BILITOT 0.56 07/06/2014 0807       RADIOGRAPHIC STUDIES: No results found. ASSESSMENT AND PLAN: This is a very pleasant 64 years old white female recently diagnosed with stage IV non-small cell lung cancer, adenocarcinoma with positive EGFR mutation in  exon 21 (L858R). She is status post 5 months of  treatment with Tarceva 150 mg by mouth daily as discontinued secondary to intolerable side effects. She is currently being treated with Tarceva at 100 mg by mouth daily, status post 1 month of treatment. The recent CT scan of the chest, abdomen and pelvis showed continuous improvement in her disease with almost complete resolution of the metastatic liver lesions. Patient was discussed with and also seen by Dr. Julien Nordmann. She will continue on Tarceva 100 mg by mouth daily. She'll follow-up in one month for another symptom management visit. For diarrhea, she was advised to take Imodium as needed. She will come back for follow-up visit in one month's for reevaluation with repeat blood work. She was advised to call immediately if she has any concerning symptoms in the interval. The patient voices understanding of current disease status and treatment options and is in agreement with the current care plan.  All questions were answered. The patient knows to call the clinic with any problems, questions or concerns. We can certainly see the patient much sooner if necessary.  Carlton Adam, PA-C 07/06/2014  ADDENDUM: Hematology/Oncology Attending: I had a face to face encounter with the patient today. I recommended her care plan. This is a very  pleasant 64 years old white female with history of stage IV non-small cell lung cancer, adenocarcinoma status post 5 months treatment with Tarceva 150 mg by mouth daily as well as 2 months of reduced dose Tarceva 100 mg by mouth daily. She is now tolerating her treatment much better with less skin rash and diarrhea. She continues to have paronychia of the toes but its improving after reducing the dose of Tarceva. I recommended for the patient to continue her current treatment with Tarceva at the same dose. She would come back for follow-up visit in one month for evaluation and repeat blood work. She was advised to  call immediately if she has any concerning symptoms in the interval.  Disclaimer: This note was dictated with voice recognition software. Similar sounding words can inadvertently be transcribed and may not be corrected upon review. Eilleen Kempf., MD 07/06/2014

## 2014-07-06 NOTE — Telephone Encounter (Signed)
gv and printed appt sched anda vs for pt for March.... °

## 2014-08-06 ENCOUNTER — Telehealth: Payer: Self-pay | Admitting: Internal Medicine

## 2014-08-06 ENCOUNTER — Encounter: Payer: Self-pay | Admitting: Internal Medicine

## 2014-08-06 ENCOUNTER — Other Ambulatory Visit (HOSPITAL_BASED_OUTPATIENT_CLINIC_OR_DEPARTMENT_OTHER): Payer: No Typology Code available for payment source

## 2014-08-06 ENCOUNTER — Ambulatory Visit (HOSPITAL_BASED_OUTPATIENT_CLINIC_OR_DEPARTMENT_OTHER): Payer: No Typology Code available for payment source | Admitting: Internal Medicine

## 2014-08-06 VITALS — BP 122/62 | HR 79 | Temp 98.5°F | Resp 19 | Ht 66.0 in | Wt 200.3 lb

## 2014-08-06 DIAGNOSIS — C3412 Malignant neoplasm of upper lobe, left bronchus or lung: Secondary | ICD-10-CM

## 2014-08-06 DIAGNOSIS — R197 Diarrhea, unspecified: Secondary | ICD-10-CM

## 2014-08-06 DIAGNOSIS — C787 Secondary malignant neoplasm of liver and intrahepatic bile duct: Secondary | ICD-10-CM

## 2014-08-06 DIAGNOSIS — R21 Rash and other nonspecific skin eruption: Secondary | ICD-10-CM

## 2014-08-06 LAB — COMPREHENSIVE METABOLIC PANEL (CC13)
ALK PHOS: 79 U/L (ref 40–150)
ALT: 17 U/L (ref 0–55)
AST: 20 U/L (ref 5–34)
Albumin: 3.6 g/dL (ref 3.5–5.0)
Anion Gap: 10 mEq/L (ref 3–11)
BUN: 17.2 mg/dL (ref 7.0–26.0)
CALCIUM: 8.7 mg/dL (ref 8.4–10.4)
CHLORIDE: 107 meq/L (ref 98–109)
CO2: 24 mEq/L (ref 22–29)
Creatinine: 0.8 mg/dL (ref 0.6–1.1)
EGFR: 84 mL/min/{1.73_m2} — ABNORMAL LOW (ref >90)
GLUCOSE: 77 mg/dL (ref 70–140)
POTASSIUM: 3.7 meq/L (ref 3.5–5.1)
Sodium: 140 mEq/L (ref 136–145)
Total Bilirubin: 0.71 mg/dL (ref 0.20–1.20)
Total Protein: 7 g/dL (ref 6.4–8.3)

## 2014-08-06 LAB — CBC WITH DIFFERENTIAL/PLATELET
BASO%: 0.1 % (ref 0.0–2.0)
BASOS ABS: 0 10*3/uL (ref 0.0–0.1)
EOS%: 2.5 % (ref 0.0–7.0)
Eosinophils Absolute: 0.2 10*3/uL (ref 0.0–0.5)
HCT: 38.2 % (ref 34.8–46.6)
HGB: 12.8 g/dL (ref 11.6–15.9)
LYMPH%: 29.4 % (ref 14.0–49.7)
MCH: 30.1 pg (ref 25.1–34.0)
MCHC: 33.5 g/dL (ref 31.5–36.0)
MCV: 89.9 fL (ref 79.5–101.0)
MONO#: 0.7 10*3/uL (ref 0.1–0.9)
MONO%: 6.9 % (ref 0.0–14.0)
NEUT%: 61.1 % (ref 38.4–76.8)
NEUTROS ABS: 5.8 10*3/uL (ref 1.5–6.5)
PLATELETS: 285 10*3/uL (ref 145–400)
RBC: 4.25 10*6/uL (ref 3.70–5.45)
RDW: 13.1 % (ref 11.2–14.5)
WBC: 9.5 10*3/uL (ref 3.9–10.3)
lymph#: 2.8 10*3/uL (ref 0.9–3.3)

## 2014-08-06 NOTE — Telephone Encounter (Signed)
gv and printed appt sched and avs for pt for pt for April....sed gv barium

## 2014-08-06 NOTE — Progress Notes (Signed)
[place] [phone]   Fax:(336) [phone]  OFFICE PROGRESS NOTE  Default, Provider, MD No address on file  DIAGNOSIS: Stage IV (T1b, N3, M1b) non-small cell lung cancer, adenocarcinoma with positive EGFR mutation in exon 21 (L858R) presented with left upper lobe lung mass in addition to left hilar and bilateral mediastinal lymphadenopathy as well as liver metastasis diagnosed in May of 2015.  PRIOR THERAPY: Tarceva 150 mg by mouth daily. Started 11/25/2013. Status post 5 months of treatment discontinued today secondary to intolerance with persistent paronychia.  CURRENT THERAPY: Tarceva 100 mg by mouth daily. Started 06/03/2014. Status post 2 months of treatment.   INTERVAL HISTORY: Debra Hodges 64 y.o. female returns to the clinic today for followup visit accompanied by her husband. The patient is doing fine today with no specific complaints except for some residual paronychia in the big toes. She notes a significant improvement in the side effects of Tarceva after reducing the dose to 100 mg by mouth daily. She denied having any significant chest pain, shortness of breath, cough or hemoptysis. She denied having any significant weight loss or night sweats. She has no nausea or vomiting.   MEDICAL HISTORY: Past Medical History  Diagnosis Date  . High blood pressure   . lung ca dx'd 10/2013    ALLERGIES:  is allergic to penicillins.  MEDICATIONS:  Current Outpatient Prescriptions  Medication Sig Dispense Refill  . acetaminophen (TYLENOL) 325 MG tablet Take 650 mg by mouth every 6 (six) hours as needed.    . calcium carbonate (OS-CAL) 600 MG TABS tablet Take 600 mg by mouth daily.     Marland Kitchen CLIMARA 0.1 MG/24HR patch 1 patch once a week.    . clindamycin (CLEOCIN T) 1 % lotion APPLY TO AFFECTED AREA(S) TWICE DAILY. 60 mL 0  . erlotinib (TARCEVA) 100 MG tablet Take 1 tablet (100 mg total) by mouth daily. Take on an empty stomach 1 hour before meals or 2  hours after. 30 tablet 2  . levothyroxine (SYNTHROID, LEVOTHROID) 25 MCG tablet Take 25 mcg by mouth daily before breakfast.    . loperamide (IMODIUM) 2 MG capsule Take by mouth as needed for diarrhea or loose stools.    . metroNIDAZOLE (METROCREAM) 0.75 % cream Apply 1 application topically daily.    . Multiple Vitamins-Calcium (ONE-A-DAY WOMENS PO) Take 1 tablet by mouth daily.    . potassium chloride SA (K-DUR,KLOR-CON) 20 MEQ tablet 1 tablet 2 (two) times daily.    Marland Kitchen spironolactone (ALDACTONE) 25 MG tablet 1 tablet daily.    . vitamin C (ASCORBIC ACID) 500 MG tablet Take 500 mg by mouth daily.    . silver nitrate applicators 10-21 % applicator Apply topically 3 (three) times a week. (Patient not taking: Reported on 08/06/2014) 100 each 0   No current facility-administered medications for this visit.    SURGICAL HISTORY:  Past Surgical History  Procedure Laterality Date  . Abdominal hysterectomy    . Breast biopsy      Right breast  . Video bronchoscopy Bilateral 10/27/2013    Procedure: VIDEO BRONCHOSCOPY WITH FLUORO;  Surgeon: Collene Gobble, MD;  Location: [facility];  Service: Cardiopulmonary;  Laterality: Bilateral;    REVIEW OF SYSTEMS:  Constitutional: negative Eyes: negative Ears, nose, mouth, throat, and face: negative Respiratory: negative Cardiovascular: negative Gastrointestinal: negative Genitourinary:negative Integument/breast: negative Hematologic/lymphatic: negative Musculoskeletal:negative Neurological: negative Behavioral/Psych: negative Endocrine: negative Allergic/Immunologic: negative   PHYSICAL EXAMINATION: General appearance: alert, cooperative and no  distress Head: Normocephalic, without obvious abnormality, atraumatic Neck: no adenopathy, no JVD, supple, symmetrical, trachea midline and thyroid not enlarged, symmetric, no tenderness/mass/nodules Lymph nodes: Cervical, supraclavicular, and axillary nodes normal. Resp: clear to auscultation  bilaterally Back: symmetric, no curvature. ROM normal. No CVA tenderness. Cardio: regular rate and rhythm, S1, S2 normal, no murmur, click, rub or gallop GI: soft, non-tender; bowel sounds normal; no masses,  no organomegaly Extremities: extremities normal, atraumatic, no cyanosis or edema and With paronychia involving the left big toe. Neurologic: Alert and oriented X 3, normal strength and tone. Normal symmetric reflexes. Normal coordination and gait Skin exam showed 2 maculopapular skin rash on the face and upper chest.  ECOG PERFORMANCE STATUS: 1 - Symptomatic but completely ambulatory  Blood pressure 122/62, pulse 79, temperature 98.5 F (36.9 C), temperature source Oral, resp. rate 19, height $RemoveBe'5\' 6"'[id number]$  (1.676 m), weight 200 lb 4.8 oz (90.855 kg), SpO2 99 %.  LABORATORY DATA: Lab Results  Component Value Date   WBC 9.5 08/06/2014   HGB 12.8 08/06/2014   HCT 38.2 08/06/2014   MCV 89.9 08/06/2014   PLT 285 08/06/2014      Chemistry      Component Value Date/Time   NA 141 07/06/2014 0807   K 3.9 07/06/2014 0807   CO2 23 07/06/2014 0807   BUN 18.6 07/06/2014 0807   CREATININE 0.8 07/06/2014 0807   CREATININE 0.71 11/06/2013 1813      Component Value Date/Time   CALCIUM 9.1 07/06/2014 0807   ALKPHOS 86 07/06/2014 0807   AST 20 07/06/2014 0807   ALT 16 07/06/2014 0807   BILITOT 0.56 07/06/2014 0807       RADIOGRAPHIC STUDIES: No results found. ASSESSMENT AND PLAN: This is a very pleasant 64 years old white female recently diagnosed with stage IV non-small cell lung cancer, adenocarcinoma with positive EGFR mutation in exon 21 (L858R). She was treated with Tarceva 150 mg by mouth daily status post 5 months of treatment and tolerating it fairly well except for the skin rash and occasional diarrhea as well as paronychia of the toes. Her dose has been reduced to 100 mg by mouth daily for the last 2 months and she is tolerating it much better. I recommended for the patient to  continue on Tarceva 100 mg by mouth daily for now. She would come back for follow-up visit in one months after repeating CT scan of the Chest, Abdomen and pelvis for reevaluation of her disease. For the skin rash,she will continue on clindamycin 1% lotion to be applied 2-3 times a day to the skin rash area. For diarrhea, she was advised to take Imodium as needed. She was advised to call immediately if she has any concerning symptoms in the interval. The patient voices understanding of current disease status and treatment options and is in agreement with the current care plan.  All questions were answered. The patient knows to call the clinic with any problems, questions or concerns. We can certainly see the patient much sooner if necessary.  Disclaimer: This note was dictated with voice recognition software. Similar sounding words can inadvertently be transcribed and may not be corrected upon review.

## 2014-08-19 ENCOUNTER — Telehealth: Payer: Self-pay | Admitting: *Deleted

## 2014-08-19 NOTE — Telephone Encounter (Signed)
Oral surgeon called to say patient needed a tooth extracted.  She wanted to know if the timing is important since patient is receiving chemotherapy.  Advised her the patient is on Tarceva continuously with no breaks.  The have a copy of her recent labs.  Assure her the patient is not on a bisphosphonate.  That was all she needed.

## 2014-09-02 ENCOUNTER — Other Ambulatory Visit: Payer: Self-pay | Admitting: *Deleted

## 2014-09-02 DIAGNOSIS — C3412 Malignant neoplasm of upper lobe, left bronchus or lung: Secondary | ICD-10-CM

## 2014-09-02 MED ORDER — ERLOTINIB HCL 100 MG PO TABS: 100.0000 mg | ORAL_TABLET | Freq: Every day | ORAL | Status: DC

## 2014-09-02 NOTE — Telephone Encounter (Signed)
[facility] called reporting refill request was faxed to [phone] on 07-29-2014 and they have not heard from our office.  Patient has called [employer] in need of refill.  Will send via [employer].  Triage fax [phone] given to [facility] with this call.

## 2014-09-04 ENCOUNTER — Other Ambulatory Visit (HOSPITAL_BASED_OUTPATIENT_CLINIC_OR_DEPARTMENT_OTHER): Payer: No Typology Code available for payment source

## 2014-09-04 ENCOUNTER — Telehealth: Payer: Self-pay

## 2014-09-04 ENCOUNTER — Encounter (HOSPITAL_COMMUNITY): Payer: Self-pay

## 2014-09-04 ENCOUNTER — Ambulatory Visit (HOSPITAL_COMMUNITY)
Admission: RE | Admit: 2014-09-04 | Discharge: 2014-09-04 | Disposition: A | Discharge status: Home / Self Care | Payer: No Typology Code available for payment source | Source: Ambulatory Visit | Attending: Internal Medicine | Admitting: Internal Medicine

## 2014-09-04 DIAGNOSIS — C3412 Malignant neoplasm of upper lobe, left bronchus or lung: Secondary | ICD-10-CM

## 2014-09-04 DIAGNOSIS — C787 Secondary malignant neoplasm of liver and intrahepatic bile duct: Secondary | ICD-10-CM

## 2014-09-04 DIAGNOSIS — R918 Other nonspecific abnormal finding of lung field: Secondary | ICD-10-CM | POA: Insufficient documentation

## 2014-09-04 DIAGNOSIS — K769 Liver disease, unspecified: Secondary | ICD-10-CM | POA: Diagnosis not present

## 2014-09-04 LAB — COMPREHENSIVE METABOLIC PANEL (CC13)
ALK PHOS: 80 U/L (ref 40–150)
ALT: 23 U/L (ref 0–55)
AST: 25 U/L (ref 5–34)
Albumin: 3.6 g/dL (ref 3.5–5.0)
Anion Gap: 10 mEq/L (ref 3–11)
BUN: 13.9 mg/dL (ref 7.0–26.0)
CHLORIDE: 107 meq/L (ref 98–109)
CO2: 23 meq/L (ref 22–29)
Calcium: 8.7 mg/dL (ref 8.4–10.4)
Creatinine: 0.8 mg/dL (ref 0.6–1.1)
EGFR: 83 mL/min/{1.73_m2} — AB (ref >90)
GLUCOSE: 86 mg/dL (ref 70–140)
Potassium: 4.2 mEq/L (ref 3.5–5.1)
Sodium: 140 mEq/L (ref 136–145)
Total Bilirubin: 0.84 mg/dL (ref 0.20–1.20)
Total Protein: 7.1 g/dL (ref 6.4–8.3)

## 2014-09-04 LAB — CBC WITH DIFFERENTIAL/PLATELET
BASO%: 0.5 % (ref 0.0–2.0)
Basophils Absolute: 0 10*3/uL (ref 0.0–0.1)
EOS%: 4.1 % (ref 0.0–7.0)
Eosinophils Absolute: 0.3 10*3/uL (ref 0.0–0.5)
HCT: 37.6 % (ref 34.8–46.6)
HEMOGLOBIN: 12.3 g/dL (ref 11.6–15.9)
LYMPH#: 2.6 10*3/uL (ref 0.9–3.3)
LYMPH%: 31.8 % (ref 14.0–49.7)
MCH: 28.9 pg (ref 25.1–34.0)
MCHC: 32.6 g/dL (ref 31.5–36.0)
MCV: 88.5 fL (ref 79.5–101.0)
MONO#: 0.7 10*3/uL (ref 0.1–0.9)
MONO%: 8 % (ref 0.0–14.0)
NEUT#: 4.6 10*3/uL (ref 1.5–6.5)
NEUT%: 55.6 % (ref 38.4–76.8)
Platelets: 269 10*3/uL (ref 145–400)
RBC: 4.24 10*6/uL (ref 3.70–5.45)
RDW: 13.4 % (ref 11.2–14.5)
WBC: 8.3 10*3/uL (ref 3.9–10.3)

## 2014-09-04 MED ORDER — IOHEXOL 300 MG/ML  SOLN
100.0000 mL | Freq: Once | INTRAMUSCULAR | Status: AC | PRN
  Administered 2014-09-04: 100 mL via INTRAVENOUS

## 2014-09-04 NOTE — Telephone Encounter (Signed)
Pt called stating Holland Falling will need a PA from [employer] for her refill on tarceva. Called [employer] to verify. Routed to [place]. See pt care coordination note. Called pt back to let her know it is in process.

## 2014-09-07 ENCOUNTER — Encounter: Payer: Self-pay | Admitting: Internal Medicine

## 2014-09-07 NOTE — Progress Notes (Signed)
I sent prior auth for tarceva to [facility]

## 2014-09-09 ENCOUNTER — Encounter: Payer: Self-pay | Admitting: Internal Medicine

## 2014-09-09 NOTE — Progress Notes (Signed)
Per Irene Shipper was approved  09/06/13-03/09/15. I will send to medical records

## 2014-09-10 ENCOUNTER — Telehealth: Payer: Self-pay | Admitting: Internal Medicine

## 2014-09-10 ENCOUNTER — Encounter: Payer: Self-pay | Admitting: Internal Medicine

## 2014-09-10 ENCOUNTER — Ambulatory Visit (HOSPITAL_BASED_OUTPATIENT_CLINIC_OR_DEPARTMENT_OTHER): Payer: No Typology Code available for payment source | Admitting: Internal Medicine

## 2014-09-10 VITALS — BP 132/59 | HR 82 | Temp 97.7°F | Resp 18 | Ht 66.0 in | Wt 203.0 lb

## 2014-09-10 DIAGNOSIS — R197 Diarrhea, unspecified: Secondary | ICD-10-CM

## 2014-09-10 DIAGNOSIS — C3412 Malignant neoplasm of upper lobe, left bronchus or lung: Secondary | ICD-10-CM | POA: Diagnosis not present

## 2014-09-10 DIAGNOSIS — L03032 Cellulitis of left toe: Secondary | ICD-10-CM | POA: Diagnosis not present

## 2014-09-10 DIAGNOSIS — R21 Rash and other nonspecific skin eruption: Secondary | ICD-10-CM

## 2014-09-10 DIAGNOSIS — L03031 Cellulitis of right toe: Secondary | ICD-10-CM

## 2014-09-10 NOTE — Progress Notes (Signed)
[place] [phone]) [phone]   [phone]  OFFICE PROGRESS NOTE  Default, Provider, MD No address on file  DIAGNOSIS: Stage IV (T1b, N3, M1b) non-small cell lung cancer, adenocarcinoma with positive EGFR mutation in exon 21 (L858R) presented with left upper lobe lung mass in addition to left hilar and bilateral mediastinal lymphadenopathy as well as liver metastasis diagnosed in May of 2015.  PRIOR THERAPY: Tarceva 150 mg by mouth daily. Started 11/25/2013. Status post 5 months of treatment discontinued today secondary to intolerance with persistent paronychia.  CURRENT THERAPY: Tarceva 100 mg by mouth daily. Started 06/03/2014. Status post 3 months of treatment.   INTERVAL HISTORY: Debra Hodges 64 y.o. female returns to the clinic today for followup visit accompanied by her husband. The patient is doing fine today with no specific complaints except for some residual paronychia in the big toes. She denied having any significant diarrhea but has mild skin rash on the face and occasional eruption on the abdomen. She denied having any significant chest pain, shortness of breath, cough or hemoptysis. She denied having any significant weight loss or night sweats. She has no nausea or vomiting. She had repeat CT scan of the chest, abdomen and pelvis performed recently and she is here for evaluation and discussion of her scan results.  MEDICAL HISTORY: Past Medical History  Diagnosis Date  . High blood pressure   . lung ca dx'd 10/2013    ALLERGIES:  is allergic to penicillins.  MEDICATIONS:  Current Outpatient Prescriptions  Medication Sig Dispense Refill  . calcium carbonate (OS-CAL) 600 MG TABS tablet Take 600 mg by mouth daily.     Marland Kitchen CLIMARA 0.1 MG/24HR patch 1 patch once a week.    . erlotinib (TARCEVA) 100 MG tablet Take 1 tablet (100 mg total) by mouth daily. Take on an empty stomach 1 hour before meals or 2 hours after. 30 tablet 2  .  levothyroxine (SYNTHROID, LEVOTHROID) 25 MCG tablet Take 25 mcg by mouth daily before breakfast.    . metroNIDAZOLE (METROCREAM) 0.75 % cream Apply 1 application topically daily.    . Multiple Vitamins-Calcium (ONE-A-DAY WOMENS PO) Take 1 tablet by mouth daily.    . potassium chloride SA (K-DUR,KLOR-CON) 20 MEQ tablet 1 tablet 2 (two) times daily.    Marland Kitchen spironolactone (ALDACTONE) 25 MG tablet 1 tablet daily.    . vitamin C (ASCORBIC ACID) 500 MG tablet Take 500 mg by mouth daily.    Marland Kitchen acetaminophen (TYLENOL) 325 MG tablet Take 650 mg by mouth every 6 (six) hours as needed.    . clindamycin (CLEOCIN T) 1 % lotion APPLY TO AFFECTED AREA(S) TWICE DAILY. (Patient not taking: Reported on 09/10/2014) 60 mL 0  . loperamide (IMODIUM) 2 MG capsule Take by mouth as needed for diarrhea or loose stools.    . silver nitrate applicators 91-91 % applicator Apply topically 3 (three) times a week. (Patient not taking: Reported on 08/06/2014) 100 each 0   No current facility-administered medications for this visit.    SURGICAL HISTORY:  Past Surgical History  Procedure Laterality Date  . Abdominal hysterectomy    . Breast biopsy      Right breast  . Video bronchoscopy Bilateral 10/27/2013    Procedure: VIDEO BRONCHOSCOPY WITH FLUORO;  Surgeon: Collene Gobble, MD;  Location: [facility];  Service: Cardiopulmonary;  Laterality: Bilateral;    REVIEW OF SYSTEMS:  Constitutional: negative Eyes: negative Ears, nose, mouth, throat, and face: negative Respiratory:  negative Cardiovascular: negative Gastrointestinal: negative Genitourinary:negative Integument/breast: negative Hematologic/lymphatic: negative Musculoskeletal:negative Neurological: negative Behavioral/Psych: negative Endocrine: negative Allergic/Immunologic: negative   PHYSICAL EXAMINATION: General appearance: alert, cooperative and no distress Head: Normocephalic, without obvious abnormality, atraumatic Neck: no adenopathy, no JVD, supple,  symmetrical, trachea midline and thyroid not enlarged, symmetric, no tenderness/mass/nodules Lymph nodes: Cervical, supraclavicular, and axillary nodes normal. Resp: clear to auscultation bilaterally Back: symmetric, no curvature. ROM normal. No CVA tenderness. Cardio: regular rate and rhythm, S1, S2 normal, no murmur, click, rub or gallop GI: soft, non-tender; bowel sounds normal; no masses,  no organomegaly Extremities: extremities normal, atraumatic, no cyanosis or edema and With paronychia involving the left big toe. Neurologic: Alert and oriented X 3, normal strength and tone. Normal symmetric reflexes. Normal coordination and gait Skin exam showed 2 maculopapular skin rash on the face and upper chest.  ECOG PERFORMANCE STATUS: 1 - Symptomatic but completely ambulatory  Blood pressure 132/59, pulse 82, temperature 97.7 F (36.5 C), temperature source Oral, resp. rate 18, height _0  (1.676 m), weight 203 lb (92.08 kg), SpO2 100 %.  LABORATORY DATA: Lab Results  Component Value Date   WBC 8.3 09/04/2014   HGB 12.3 09/04/2014   HCT 37.6 09/04/2014   MCV 88.5 09/04/2014   PLT 269 09/04/2014      Chemistry      Component Value Date/Time   NA 140 09/04/2014 0806   K 4.2 09/04/2014 0806   CO2 23 09/04/2014 0806   BUN 13.9 09/04/2014 0806   CREATININE 0.8 09/04/2014 0806   CREATININE 0.71 11/06/2013 1813      Component Value Date/Time   CALCIUM 8.7 09/04/2014 0806   ALKPHOS 80 09/04/2014 0806   AST 25 09/04/2014 0806   ALT 23 09/04/2014 0806   BILITOT 0.84 09/04/2014 0806       RADIOGRAPHIC STUDIES: Ct Chest W Contrast  09/04/2014   CLINICAL DATA:  Left upper lobe lung cancer diagnosed 2015, chemotherapy ongoing.  EXAM: CT CHEST, ABDOMEN, AND PELVIS WITH CONTRAST  TECHNIQUE: Multidetector CT imaging of the chest, abdomen and pelvis was performed following the standard protocol during bolus administration of intravenous contrast.  CONTRAST:  188m OMNIPAQUE IOHEXOL 300  MG/ML  SOLN  COMPARISON:  05/26/2014.  FINDINGS: CT CHEST FINDINGS  Mediastinum/Nodes: Heart is normal in size. No pericardial effusion.  Mild atherosclerotic calcifications of the aortic arch.  No suspicious mediastinal or hilar lymphadenopathy.  Small bilateral axillary nodes measuring up to 8 mm short axis, with preservation of the normal fatty hilum.  Visualized right thyroid is enlarged/heterogeneous with a 14 mm inferior right thyroid nodule (series 2/ image 6).  Lungs/Pleura: 3.2 x 1.8 x 1.9 cm lingular mass (series 4/ image 24), previously 2.9 x 1.8 x 1.9 cm, corresponding to known primary bronchogenic neoplasm, grossly unchanged.  Lungs are otherwise essentially clear. No new/ suspicious pulmonary nodules.  No pleural effusion or pneumothorax.  Musculoskeletal: Degenerative changes of the thoracic spine.  CT ABDOMEN PELVIS FINDINGS  Hepatobiliary: Prior hepatic metastases are no longer visualized. Vague 6 x 10 mm hypoenhancing lesion in the anterior segment right hepatic lobe, poorly visualized. Liver is otherwise within normal limits.  Gallbladder is unremarkable. No intrahepatic or extrahepatic ductal dilatation.  Pancreas: Within normal limits.  Spleen: 8 mm fluid density lesion in the central spleen (series 2/ image 49).  Adrenals/Urinary Tract: Adrenal glands are within normal limits.  Kidneys are within normal limits.  No hydronephrosis.  Bladder is moderately distended but within normal limits.  Stomach/Bowel: Stomach  is within normal limits.  No evidence of bowel obstruction.  Vascular/Lymphatic: Atherosclerotic calcifications of the abdominal aorta and branch vessels.  No suspicious abdominopelvic lymphadenopathy.  Reproductive: Status post hysterectomy.  No adnexal masses.  Other: No abdominopelvic ascites.  Musculoskeletal: Sclerotic lesion in the L5 vertebral body, likely reflecting a benign bone island.  IMPRESSION: Stable lingular mass, corresponding to known primary bronchogenic neoplasm.   Prior hepatic metastases are no longer visualized.  Vague 6 x 10 mm hypoenhancing lesion in the anterior right hepatic lobe, poorly visualized. Attention on follow-up is suggested.  Otherwise, no findings suspicious for metastatic disease.   Electronically Signed   By: Julian Hy M.D.   On: 09/04/2014 10:19   Ct Abdomen Pelvis W Contrast  09/04/2014   CLINICAL DATA:  Left upper lobe lung cancer diagnosed 2015, chemotherapy ongoing.  EXAM: CT CHEST, ABDOMEN, AND PELVIS WITH CONTRAST  TECHNIQUE: Multidetector CT imaging of the chest, abdomen and pelvis was performed following the standard protocol during bolus administration of intravenous contrast.  CONTRAST:  139m OMNIPAQUE IOHEXOL 300 MG/ML  SOLN  COMPARISON:  05/26/2014.  FINDINGS: CT CHEST FINDINGS  Mediastinum/Nodes: Heart is normal in size. No pericardial effusion.  Mild atherosclerotic calcifications of the aortic arch.  No suspicious mediastinal or hilar lymphadenopathy.  Small bilateral axillary nodes measuring up to 8 mm short axis, with preservation of the normal fatty hilum.  Visualized right thyroid is enlarged/heterogeneous with a 14 mm inferior right thyroid nodule (series 2/ image 6).  Lungs/Pleura: 3.2 x 1.8 x 1.9 cm lingular mass (series 4/ image 24), previously 2.9 x 1.8 x 1.9 cm, corresponding to known primary bronchogenic neoplasm, grossly unchanged.  Lungs are otherwise essentially clear. No new/ suspicious pulmonary nodules.  No pleural effusion or pneumothorax.  Musculoskeletal: Degenerative changes of the thoracic spine.  CT ABDOMEN PELVIS FINDINGS  Hepatobiliary: Prior hepatic metastases are no longer visualized. Vague 6 x 10 mm hypoenhancing lesion in the anterior segment right hepatic lobe, poorly visualized. Liver is otherwise within normal limits.  Gallbladder is unremarkable. No intrahepatic or extrahepatic ductal dilatation.  Pancreas: Within normal limits.  Spleen: 8 mm fluid density lesion in the central spleen (series 2/  image 49).  Adrenals/Urinary Tract: Adrenal glands are within normal limits.  Kidneys are within normal limits.  No hydronephrosis.  Bladder is moderately distended but within normal limits.  Stomach/Bowel: Stomach is within normal limits.  No evidence of bowel obstruction.  Vascular/Lymphatic: Atherosclerotic calcifications of the abdominal aorta and branch vessels.  No suspicious abdominopelvic lymphadenopathy.  Reproductive: Status post hysterectomy.  No adnexal masses.  Other: No abdominopelvic ascites.  Musculoskeletal: Sclerotic lesion in the L5 vertebral body, likely reflecting a benign bone island.  IMPRESSION: Stable lingular mass, corresponding to known primary bronchogenic neoplasm.  Prior hepatic metastases are no longer visualized.  Vague 6 x 10 mm hypoenhancing lesion in the anterior right hepatic lobe, poorly visualized. Attention on follow-up is suggested.  Otherwise, no findings suspicious for metastatic disease.   Electronically Signed   By: SJulian HyM.D.   On: 09/04/2014 10:19   ASSESSMENT AND PLAN: This is a very pleasant 64years old white female recently diagnosed with stage IV non-small cell lung cancer, adenocarcinoma with positive EGFR mutation in exon 21 (L858R). She was treated with Tarceva 150 mg by mouth daily status post 5 months of treatment and tolerating it fairly well except for the skin rash and occasional diarrhea as well as paronychia of the toes.  She is currently on  Tarceva 100 mg by mouth daily for the last 3 months and she is tolerating it much better. The recent CT scan of the chest, abdomen and pelvis showed continuous improvement in her disease. I discussed the scan results with the patient and her husband today. I recommended for the patient to continue on Tarceva 100 mg by mouth daily for now. She would come back for follow-up visit in one months after repeating CBC and comprehensive metabolic panel.  For the skin rash,she will continue on clindamycin 1%  lotion to be applied 2-3 times a day to the skin rash area. For diarrhea, she was advised to take Imodium as needed. She was advised to call immediately if she has any concerning symptoms in the interval. The patient voices understanding of current disease status and treatment options and is in agreement with the current care plan.  All questions were answered. The patient knows to call the clinic with any problems, questions or concerns. We can certainly see the patient much sooner if necessary.  Disclaimer: This note was dictated with voice recognition software. Similar sounding words can inadvertently be transcribed and may not be corrected upon review.

## 2014-09-10 NOTE — Telephone Encounter (Signed)
gave and printed appt sched and avs for pt for MAY

## 2014-10-21 ENCOUNTER — Ambulatory Visit (HOSPITAL_BASED_OUTPATIENT_CLINIC_OR_DEPARTMENT_OTHER): Payer: No Typology Code available for payment source | Admitting: Internal Medicine

## 2014-10-21 ENCOUNTER — Telehealth: Payer: Self-pay | Admitting: Internal Medicine

## 2014-10-21 ENCOUNTER — Encounter: Payer: Self-pay | Admitting: Internal Medicine

## 2014-10-21 ENCOUNTER — Other Ambulatory Visit (HOSPITAL_BASED_OUTPATIENT_CLINIC_OR_DEPARTMENT_OTHER): Payer: No Typology Code available for payment source

## 2014-10-21 VITALS — BP 129/49 | HR 79 | Temp 98.1°F | Resp 18 | Ht 66.0 in | Wt 202.1 lb

## 2014-10-21 DIAGNOSIS — R21 Rash and other nonspecific skin eruption: Secondary | ICD-10-CM

## 2014-10-21 DIAGNOSIS — C3412 Malignant neoplasm of upper lobe, left bronchus or lung: Secondary | ICD-10-CM | POA: Diagnosis not present

## 2014-10-21 DIAGNOSIS — C787 Secondary malignant neoplasm of liver and intrahepatic bile duct: Secondary | ICD-10-CM | POA: Diagnosis not present

## 2014-10-21 DIAGNOSIS — R197 Diarrhea, unspecified: Secondary | ICD-10-CM

## 2014-10-21 DIAGNOSIS — L03032 Cellulitis of left toe: Secondary | ICD-10-CM

## 2014-10-21 DIAGNOSIS — L03031 Cellulitis of right toe: Secondary | ICD-10-CM

## 2014-10-21 LAB — CBC WITH DIFFERENTIAL/PLATELET
BASO%: 0.4 % (ref 0.0–2.0)
BASOS ABS: 0 10*3/uL (ref 0.0–0.1)
EOS ABS: 0.3 10*3/uL (ref 0.0–0.5)
EOS%: 2.9 % (ref 0.0–7.0)
HEMATOCRIT: 37.8 % (ref 34.8–46.6)
HGB: 12.8 g/dL (ref 11.6–15.9)
LYMPH#: 3 10*3/uL (ref 0.9–3.3)
LYMPH%: 29.7 % (ref 14.0–49.7)
MCH: 30.3 pg (ref 25.1–34.0)
MCHC: 33.9 g/dL (ref 31.5–36.0)
MCV: 89.3 fL (ref 79.5–101.0)
MONO#: 0.6 10*3/uL (ref 0.1–0.9)
MONO%: 6.3 % (ref 0.0–14.0)
NEUT#: 6.2 10*3/uL (ref 1.5–6.5)
NEUT%: 60.7 % (ref 38.4–76.8)
Platelets: 305 10*3/uL (ref 145–400)
RBC: 4.24 10*6/uL (ref 3.70–5.45)
RDW: 13.3 % (ref 11.2–14.5)
WBC: 10.2 10*3/uL (ref 3.9–10.3)

## 2014-10-21 LAB — COMPREHENSIVE METABOLIC PANEL (CC13)
ALT: 14 U/L (ref 0–55)
AST: 21 U/L (ref 5–34)
Albumin: 3.7 g/dL (ref 3.5–5.0)
Alkaline Phosphatase: 83 U/L (ref 40–150)
Anion Gap: 9 mEq/L (ref 3–11)
BILIRUBIN TOTAL: 0.88 mg/dL (ref 0.20–1.20)
BUN: 13.3 mg/dL (ref 7.0–26.0)
CO2: 25 mEq/L (ref 22–29)
Calcium: 8.6 mg/dL (ref 8.4–10.4)
Chloride: 106 mEq/L (ref 98–109)
Creatinine: 0.8 mg/dL (ref 0.6–1.1)
EGFR: 78 mL/min/{1.73_m2} — ABNORMAL LOW (ref >90)
GLUCOSE: 83 mg/dL (ref 70–140)
POTASSIUM: 3.8 meq/L (ref 3.5–5.1)
SODIUM: 140 meq/L (ref 136–145)
Total Protein: 7.2 g/dL (ref 6.4–8.3)

## 2014-10-21 NOTE — Telephone Encounter (Signed)
Pt confirmed labs/ov per 05/25 POF, gave pt AVS and Calendar.... KJ

## 2014-10-21 NOTE — Progress Notes (Signed)
[place] [phone]   Fax:(336) [phone]  OFFICE PROGRESS NOTE  Default, Provider, MD No address on file  DIAGNOSIS: Stage IV (T1b, N3, M1b) non-small cell lung cancer, adenocarcinoma with positive EGFR mutation in exon 21 (L858R) presented with left upper lobe lung mass in addition to left hilar and bilateral mediastinal lymphadenopathy as well as liver metastasis diagnosed in May of 2015.  PRIOR THERAPY: Tarceva 150 mg by mouth daily. Started 11/25/2013. Status post 5 months of treatment discontinued today secondary to intolerance with persistent paronychia.  CURRENT THERAPY: Tarceva 100 mg by mouth daily. Started 06/03/2014. Status post 4 months of treatment.   INTERVAL HISTORY: Debra Hodges 64 y.o. female returns to the clinic today for followup visit. The patient is doing fine today with no specific complaints except for some residual paronychia in the big toes. She 1 or 2 episodes of diarrhea every few days as well as mild skin rash on the face. She denied having any significant chest pain, shortness of breath, cough or hemoptysis. She denied having any significant weight loss or night sweats. She has no nausea or vomiting. She had repeat CBC and comprehensive metabolic panel performed earlier today and she is here for evaluation and discussion of her lab results.  MEDICAL HISTORY: Past Medical History  Diagnosis Date  . High blood pressure   . lung ca dx'd 10/2013    ALLERGIES:  is allergic to penicillins.  MEDICATIONS:  Current Outpatient Prescriptions  Medication Sig Dispense Refill  . acetaminophen (TYLENOL) 325 MG tablet Take 650 mg by mouth every 6 (six) hours as needed.    . calcium carbonate (OS-CAL) 600 MG TABS tablet Take 600 mg by mouth daily.     Marland Kitchen CLIMARA 0.1 MG/24HR patch 1 patch once a week.    . clindamycin (CLEOCIN T) 1 % lotion APPLY TO AFFECTED AREA(S) TWICE DAILY. (Patient not taking: Reported on 09/10/2014) 60 mL 0  .  erlotinib (TARCEVA) 100 MG tablet Take 1 tablet (100 mg total) by mouth daily. Take on an empty stomach 1 hour before meals or 2 hours after. 30 tablet 2  . levothyroxine (SYNTHROID, LEVOTHROID) 25 MCG tablet Take 25 mcg by mouth daily before breakfast.    . loperamide (IMODIUM) 2 MG capsule Take by mouth as needed for diarrhea or loose stools.    . metroNIDAZOLE (METROCREAM) 0.75 % cream Apply 1 application topically daily.    . Multiple Vitamins-Calcium (ONE-A-DAY WOMENS PO) Take 1 tablet by mouth daily.    . potassium chloride SA (K-DUR,KLOR-CON) 20 MEQ tablet 1 tablet 2 (two) times daily.    . silver nitrate applicators 43-15 % applicator Apply topically 3 (three) times a week. (Patient not taking: Reported on 08/06/2014) 100 each 0  . spironolactone (ALDACTONE) 25 MG tablet 1 tablet daily.    . vitamin C (ASCORBIC ACID) 500 MG tablet Take 500 mg by mouth daily.     No current facility-administered medications for this visit.    SURGICAL HISTORY:  Past Surgical History  Procedure Laterality Date  . Abdominal hysterectomy    . Breast biopsy      Right breast  . Video bronchoscopy Bilateral 10/27/2013    Procedure: VIDEO BRONCHOSCOPY WITH FLUORO;  Surgeon: Collene Gobble, MD;  Location: [facility];  Service: Cardiopulmonary;  Laterality: Bilateral;    REVIEW OF SYSTEMS:  A comprehensive review of systems was negative except for: Gastrointestinal: positive for diarrhea Integument/breast: positive for rash  PHYSICAL EXAMINATION: General appearance: alert, cooperative and no distress Head: Normocephalic, without obvious abnormality, atraumatic Neck: no adenopathy, no JVD, supple, symmetrical, trachea midline and thyroid not enlarged, symmetric, no tenderness/mass/nodules Lymph nodes: Cervical, supraclavicular, and axillary nodes normal. Resp: clear to auscultation bilaterally Back: symmetric, no curvature. ROM normal. No CVA tenderness. Cardio: regular rate and rhythm, S1, S2 normal,  no murmur, click, rub or gallop GI: soft, non-tender; bowel sounds normal; no masses,  no organomegaly Extremities: extremities normal, atraumatic, no cyanosis or edema and With paronychia involving the left big toe. Neurologic: Alert and oriented X 3, normal strength and tone. Normal symmetric reflexes. Normal coordination and gait Skin exam showed maculopapular skin rash on the face and upper chest.  ECOG PERFORMANCE STATUS: 1 - Symptomatic but completely ambulatory  Blood pressure 129/49, pulse 79, temperature 98.1 F (36.7 C), temperature source Oral, resp. rate 18, height $RemoveBe'5\' 6"'[id number]$  (1.676 m), weight 202 lb 1.6 oz (91.672 kg), SpO2 100 %.  LABORATORY DATA: Lab Results  Component Value Date   WBC 10.2 10/21/2014   HGB 12.8 10/21/2014   HCT 37.8 10/21/2014   MCV 89.3 10/21/2014   PLT 305 10/21/2014      Chemistry      Component Value Date/Time   NA 140 09/04/2014 0806   K 4.2 09/04/2014 0806   CO2 23 09/04/2014 0806   BUN 13.9 09/04/2014 0806   CREATININE 0.8 09/04/2014 0806   CREATININE 0.71 11/06/2013 1813      Component Value Date/Time   CALCIUM 8.7 09/04/2014 0806   ALKPHOS 80 09/04/2014 0806   AST 25 09/04/2014 0806   ALT 23 09/04/2014 0806   BILITOT 0.84 09/04/2014 0806       RADIOGRAPHIC STUDIES: No results found. ASSESSMENT AND PLAN: This is a very pleasant 64 years old white female recently diagnosed with stage IV non-small cell lung cancer, adenocarcinoma with positive EGFR mutation in exon 21 (L858R). She was treated with Tarceva 150 mg by mouth daily status post 5 months of treatment and tolerating it fairly well except for the skin rash and occasional diarrhea as well as paronychia of the toes.  She is currently on Tarceva 100 mg by mouth daily for the last 4 months and she is tolerating it much better. I asked her to see a podiatrist for evaluation of the persistent paronychia. I recommended for the patient to continue on Tarceva 100 mg by mouth daily for  now. She would come back for follow-up visit in one months after repeating CBC and comprehensive metabolic panel.  For the skin rash,she will continue on clindamycin 1% lotion to be applied 2-3 times a day to the skin rash area. For diarrhea, she was advised to take Imodium as needed. She was advised to call immediately if she has any concerning symptoms in the interval. The patient voices understanding of current disease status and treatment options and is in agreement with the current care plan.  All questions were answered. The patient knows to call the clinic with any problems, questions or concerns. We can certainly see the patient much sooner if necessary.  Disclaimer: This note was dictated with voice recognition software. Similar sounding words can inadvertently be transcribed and may not be corrected upon review.

## 2014-11-11 ENCOUNTER — Telehealth: Payer: Self-pay | Admitting: Medical Oncology

## 2014-11-11 NOTE — Telephone Encounter (Signed)
I contacted pt and she just received a refill on her tarceva and does not need any right now. She switched insurance but will continue to get refill via [employer]. No refills needed at this time.

## 2014-11-24 ENCOUNTER — Other Ambulatory Visit (HOSPITAL_BASED_OUTPATIENT_CLINIC_OR_DEPARTMENT_OTHER): Payer: Managed Care, Other (non HMO)

## 2014-11-24 ENCOUNTER — Ambulatory Visit (HOSPITAL_BASED_OUTPATIENT_CLINIC_OR_DEPARTMENT_OTHER): Payer: Managed Care, Other (non HMO) | Admitting: Internal Medicine

## 2014-11-24 ENCOUNTER — Encounter: Payer: Self-pay | Admitting: Internal Medicine

## 2014-11-24 ENCOUNTER — Telehealth: Payer: Self-pay | Admitting: Internal Medicine

## 2014-11-24 VITALS — BP 132/59 | HR 79 | Temp 99.1°F | Resp 18 | Ht 66.0 in | Wt 204.3 lb

## 2014-11-24 DIAGNOSIS — L03031 Cellulitis of right toe: Secondary | ICD-10-CM

## 2014-11-24 DIAGNOSIS — C787 Secondary malignant neoplasm of liver and intrahepatic bile duct: Secondary | ICD-10-CM | POA: Diagnosis not present

## 2014-11-24 DIAGNOSIS — R21 Rash and other nonspecific skin eruption: Secondary | ICD-10-CM

## 2014-11-24 DIAGNOSIS — R197 Diarrhea, unspecified: Secondary | ICD-10-CM | POA: Diagnosis not present

## 2014-11-24 DIAGNOSIS — C3412 Malignant neoplasm of upper lobe, left bronchus or lung: Secondary | ICD-10-CM

## 2014-11-24 DIAGNOSIS — L03032 Cellulitis of left toe: Secondary | ICD-10-CM

## 2014-11-24 LAB — COMPREHENSIVE METABOLIC PANEL (CC13)
ALK PHOS: 81 U/L (ref 40–150)
ALT: 21 U/L (ref 0–55)
AST: 20 U/L (ref 5–34)
Albumin: 3.6 g/dL (ref 3.5–5.0)
Anion Gap: 7 mEq/L (ref 3–11)
BUN: 17.1 mg/dL (ref 7.0–26.0)
CO2: 26 mEq/L (ref 22–29)
CREATININE: 0.8 mg/dL (ref 0.6–1.1)
Calcium: 8.7 mg/dL (ref 8.4–10.4)
Chloride: 106 mEq/L (ref 98–109)
EGFR: 74 mL/min/{1.73_m2} — AB (ref >90)
Glucose: 75 mg/dl (ref 70–140)
POTASSIUM: 4.2 meq/L (ref 3.5–5.1)
Sodium: 139 mEq/L (ref 136–145)
TOTAL PROTEIN: 7 g/dL (ref 6.4–8.3)
Total Bilirubin: 0.76 mg/dL (ref 0.20–1.20)

## 2014-11-24 LAB — CBC WITH DIFFERENTIAL/PLATELET
BASO%: 0 % (ref 0.0–2.0)
BASOS ABS: 0 10*3/uL (ref 0.0–0.1)
EOS%: 2.5 % (ref 0.0–7.0)
Eosinophils Absolute: 0.3 10*3/uL (ref 0.0–0.5)
HEMATOCRIT: 37.9 % (ref 34.8–46.6)
HEMOGLOBIN: 12.7 g/dL (ref 11.6–15.9)
LYMPH%: 28 % (ref 14.0–49.7)
MCH: 30.2 pg (ref 25.1–34.0)
MCHC: 33.5 g/dL (ref 31.5–36.0)
MCV: 90 fL (ref 79.5–101.0)
MONO#: 0.8 10*3/uL (ref 0.1–0.9)
MONO%: 7.1 % (ref 0.0–14.0)
NEUT%: 62.4 % (ref 38.4–76.8)
NEUTROS ABS: 7.3 10*3/uL — AB (ref 1.5–6.5)
Platelets: 279 10*3/uL (ref 145–400)
RBC: 4.21 10*6/uL (ref 3.70–5.45)
RDW: 13.2 % (ref 11.2–14.5)
WBC: 11.7 10*3/uL — ABNORMAL HIGH (ref 3.9–10.3)
lymph#: 3.3 10*3/uL (ref 0.9–3.3)

## 2014-11-24 NOTE — Progress Notes (Signed)
[place] Telephone:(336) [phone]   [phone]  OFFICE PROGRESS NOTE  Default, Provider, MD No address on file  DIAGNOSIS: Stage IV (T1b, N3, M1b) non-small cell lung cancer, adenocarcinoma with positive EGFR mutation in exon 21 (L858R) presented with left upper lobe lung mass in addition to left hilar and bilateral mediastinal lymphadenopathy as well as liver metastasis diagnosed in May of 2015.  PRIOR THERAPY: Tarceva 150 mg by mouth daily. Started 11/25/2013. Status post 5 months of treatment discontinued today secondary to intolerance with persistent paronychia.  CURRENT THERAPY: Tarceva 100 mg by mouth daily. Started 06/03/2014. Status post 5 months of treatment.   INTERVAL HISTORY: Debra Hodges 64 y.o. female returns to the clinic today for followup visit. The patient is doing fine today with no specific complaints except for some residual paronychia in the big toes. She has intermittent diarrhea and mild skin rash on the face. She denied having any significant chest pain, shortness of breath, cough or hemoptysis. She denied having any significant weight loss or night sweats. She has no nausea or vomiting. She had repeat CBC and comprehensive metabolic panel performed earlier today and she is here for evaluation and discussion of her lab results.  MEDICAL HISTORY: Past Medical History  Diagnosis Date  . High blood pressure   . lung ca dx'd 10/2013    ALLERGIES:  is allergic to penicillins.  MEDICATIONS:  Current Outpatient Prescriptions  Medication Sig Dispense Refill  . acetaminophen (TYLENOL) 325 MG tablet Take 650 mg by mouth every 6 (six) hours as needed.    . calcium carbonate (OS-CAL) 600 MG TABS tablet Take 600 mg by mouth daily.     Marland Kitchen CLIMARA 0.1 MG/24HR patch 1 patch once a week.    . clindamycin (CLEOCIN T) 1 % lotion APPLY TO AFFECTED AREA(S) TWICE DAILY. (Patient not taking: Reported on 09/10/2014) 60 mL 0  . erlotinib (TARCEVA) 100  MG tablet Take 1 tablet (100 mg total) by mouth daily. Take on an empty stomach 1 hour before meals or 2 hours after. 30 tablet 2  . levothyroxine (SYNTHROID, LEVOTHROID) 25 MCG tablet Take 25 mcg by mouth daily before breakfast.    . loperamide (IMODIUM) 2 MG capsule Take by mouth as needed for diarrhea or loose stools.    . metroNIDAZOLE (METROCREAM) 0.75 % cream Apply 1 application topically daily.    . Multiple Vitamins-Calcium (ONE-A-DAY WOMENS PO) Take 1 tablet by mouth daily.    . potassium chloride SA (K-DUR,KLOR-CON) 20 MEQ tablet 1 tablet 2 (two) times daily.    . silver nitrate applicators 67-59 % applicator Apply topically 3 (three) times a week. (Patient not taking: Reported on 08/06/2014) 100 each 0  . spironolactone (ALDACTONE) 25 MG tablet 1 tablet daily.    . vitamin C (ASCORBIC ACID) 500 MG tablet Take 500 mg by mouth daily.     No current facility-administered medications for this visit.    SURGICAL HISTORY:  Past Surgical History  Procedure Laterality Date  . Abdominal hysterectomy    . Breast biopsy      Right breast  . Video bronchoscopy Bilateral 10/27/2013    Procedure: VIDEO BRONCHOSCOPY WITH FLUORO;  Surgeon: Collene Gobble, MD;  Location: [facility];  Service: Cardiopulmonary;  Laterality: Bilateral;    REVIEW OF SYSTEMS:  A comprehensive review of systems was negative except for: Gastrointestinal: positive for diarrhea Integument/breast: positive for rash   PHYSICAL EXAMINATION: General appearance: alert, cooperative and no  distress Head: Normocephalic, without obvious abnormality, atraumatic Neck: no adenopathy, no JVD, supple, symmetrical, trachea midline and thyroid not enlarged, symmetric, no tenderness/mass/nodules Lymph nodes: Cervical, supraclavicular, and axillary nodes normal. Resp: clear to auscultation bilaterally Back: symmetric, no curvature. ROM normal. No CVA tenderness. Cardio: regular rate and rhythm, S1, S2 normal, no murmur, click, rub or  gallop GI: soft, non-tender; bowel sounds normal; no masses,  no organomegaly Extremities: extremities normal, atraumatic, no cyanosis or edema and With paronychia involving the left big toe. Neurologic: Alert and oriented X 3, normal strength and tone. Normal symmetric reflexes. Normal coordination and gait Skin exam showed maculopapular skin rash on the face and upper chest.  ECOG PERFORMANCE STATUS: 1 - Symptomatic but completely ambulatory  Blood pressure 132/59, pulse 79, temperature 99.1 F (37.3 C), temperature source Oral, resp. rate 18, height '5\' 6"'  (1.676 m), weight 204 lb 4.8 oz (92.67 kg), SpO2 100 %.  LABORATORY DATA: Lab Results  Component Value Date   WBC 11.7* 11/24/2014   HGB 12.7 11/24/2014   HCT 37.9 11/24/2014   MCV 90.0 11/24/2014   PLT 279 11/24/2014      Chemistry      Component Value Date/Time   NA 140 10/21/2014 0810   K 3.8 10/21/2014 0810   CO2 25 10/21/2014 0810   BUN 13.3 10/21/2014 0810   CREATININE 0.8 10/21/2014 0810   CREATININE 0.71 11/06/2013 1813      Component Value Date/Time   CALCIUM 8.6 10/21/2014 0810   ALKPHOS 83 10/21/2014 0810   AST 21 10/21/2014 0810   ALT 14 10/21/2014 0810   BILITOT 0.88 10/21/2014 0810       RADIOGRAPHIC STUDIES: No results found. ASSESSMENT AND PLAN: This is a very pleasant 64 years old white female recently diagnosed with stage IV non-small cell lung cancer, adenocarcinoma with positive EGFR mutation in exon 21 (L858R). She was treated with Tarceva 150 mg by mouth daily status post 5 months of treatment and tolerating it fairly well except for the skin rash and occasional diarrhea as well as paronychia of the toes.  She is currently on Tarceva 100 mg by mouth daily for the last 5 months and she is tolerating it much better. I advised the patient to see a podiatrist for evaluation of her paronychia I recommended for the patient to continue on Tarceva 100 mg by mouth daily for now. She would come back for  follow-up visit in one months after repeating CBC and comprehensive metabolic panel as well as CT scan of the chest, abdomen and pelvis for restaging of her disease.  For the skin rash,she will continue on clindamycin 1% lotion to be applied 2-3 times a day to the skin rash area. For diarrhea, she was advised to take Imodium as needed. She was advised to call immediately if she has any concerning symptoms in the interval. The patient voices understanding of current disease status and treatment options and is in agreement with the current care plan.  All questions were answered. The patient knows to call the clinic with any problems, questions or concerns. We can certainly see the patient much sooner if necessary.  Disclaimer: This note was dictated with voice recognition software. Similar sounding words can inadvertently be transcribed and may not be corrected upon review.

## 2014-11-24 NOTE — Telephone Encounter (Signed)
per pof to sch pt appt-adv pt [facility] to call to sch scan-gave pt copy off avs, gave contrast

## 2014-12-09 ENCOUNTER — Other Ambulatory Visit: Payer: Self-pay | Admitting: *Deleted

## 2014-12-09 DIAGNOSIS — C3412 Malignant neoplasm of upper lobe, left bronchus or lung: Secondary | ICD-10-CM

## 2014-12-09 MED ORDER — ERLOTINIB HCL 100 MG PO TABS: 100.0000 mg | ORAL_TABLET | Freq: Every day | ORAL | Status: DC

## 2014-12-09 NOTE — Telephone Encounter (Signed)
Patient called requesting "Tarceva renewal.  We've changed insuarnce and do not use [employer]. Send to [facility]".

## 2014-12-15 ENCOUNTER — Telehealth: Payer: Self-pay | Admitting: Internal Medicine

## 2014-12-15 NOTE — Telephone Encounter (Signed)
pt called to r/s appt due to ct r/s due to ins....pt aware of new d.t

## 2014-12-16 ENCOUNTER — Other Ambulatory Visit: Payer: Managed Care, Other (non HMO)

## 2014-12-16 ENCOUNTER — Ambulatory Visit (HOSPITAL_COMMUNITY): Payer: Managed Care, Other (non HMO)

## 2014-12-23 ENCOUNTER — Ambulatory Visit: Payer: Managed Care, Other (non HMO) | Admitting: Internal Medicine

## 2014-12-24 ENCOUNTER — Ambulatory Visit (HOSPITAL_COMMUNITY)
Admission: RE | Admit: 2014-12-24 | Discharge: 2014-12-24 | Disposition: A | Discharge status: Home / Self Care | Payer: Managed Care, Other (non HMO) | Source: Ambulatory Visit | Attending: Internal Medicine | Admitting: Internal Medicine

## 2014-12-24 ENCOUNTER — Encounter (HOSPITAL_COMMUNITY): Payer: Self-pay

## 2014-12-24 DIAGNOSIS — C3412 Malignant neoplasm of upper lobe, left bronchus or lung: Secondary | ICD-10-CM | POA: Diagnosis present

## 2014-12-24 DIAGNOSIS — M479 Spondylosis, unspecified: Secondary | ICD-10-CM | POA: Diagnosis not present

## 2014-12-24 DIAGNOSIS — N200 Calculus of kidney: Secondary | ICD-10-CM | POA: Diagnosis not present

## 2014-12-24 DIAGNOSIS — K769 Liver disease, unspecified: Secondary | ICD-10-CM | POA: Insufficient documentation

## 2014-12-24 DIAGNOSIS — D7389 Other diseases of spleen: Secondary | ICD-10-CM | POA: Insufficient documentation

## 2014-12-24 DIAGNOSIS — D1771 Benign lipomatous neoplasm of kidney: Secondary | ICD-10-CM | POA: Diagnosis not present

## 2014-12-24 DIAGNOSIS — E042 Nontoxic multinodular goiter: Secondary | ICD-10-CM | POA: Diagnosis not present

## 2014-12-24 DIAGNOSIS — I700 Atherosclerosis of aorta: Secondary | ICD-10-CM | POA: Insufficient documentation

## 2014-12-24 DIAGNOSIS — R938 Abnormal findings on diagnostic imaging of other specified body structures: Secondary | ICD-10-CM | POA: Insufficient documentation

## 2014-12-24 MED ORDER — IOHEXOL 300 MG/ML  SOLN
100.0000 mL | Freq: Once | INTRAMUSCULAR | Status: AC | PRN
  Administered 2014-12-24: 100 mL via INTRAVENOUS

## 2015-01-06 ENCOUNTER — Other Ambulatory Visit: Payer: Self-pay | Admitting: Internal Medicine

## 2015-01-06 ENCOUNTER — Encounter: Payer: Self-pay | Admitting: Internal Medicine

## 2015-01-06 ENCOUNTER — Ambulatory Visit: Payer: Managed Care, Other (non HMO)

## 2015-01-06 ENCOUNTER — Telehealth: Payer: Self-pay | Admitting: Internal Medicine

## 2015-01-06 ENCOUNTER — Ambulatory Visit (HOSPITAL_BASED_OUTPATIENT_CLINIC_OR_DEPARTMENT_OTHER): Payer: Managed Care, Other (non HMO) | Admitting: Internal Medicine

## 2015-01-06 ENCOUNTER — Other Ambulatory Visit (HOSPITAL_BASED_OUTPATIENT_CLINIC_OR_DEPARTMENT_OTHER): Payer: Managed Care, Other (non HMO)

## 2015-01-06 VITALS — BP 125/54 | HR 76 | Temp 98.0°F | Resp 18 | Ht 66.0 in | Wt 205.5 lb

## 2015-01-06 DIAGNOSIS — N898 Other specified noninflammatory disorders of vagina: Secondary | ICD-10-CM

## 2015-01-06 DIAGNOSIS — C787 Secondary malignant neoplasm of liver and intrahepatic bile duct: Secondary | ICD-10-CM

## 2015-01-06 DIAGNOSIS — C3412 Malignant neoplasm of upper lobe, left bronchus or lung: Secondary | ICD-10-CM

## 2015-01-06 DIAGNOSIS — L03031 Cellulitis of right toe: Secondary | ICD-10-CM

## 2015-01-06 DIAGNOSIS — Z5111 Encounter for antineoplastic chemotherapy: Secondary | ICD-10-CM

## 2015-01-06 DIAGNOSIS — L03032 Cellulitis of left toe: Secondary | ICD-10-CM

## 2015-01-06 DIAGNOSIS — R197 Diarrhea, unspecified: Secondary | ICD-10-CM | POA: Diagnosis not present

## 2015-01-06 DIAGNOSIS — R21 Rash and other nonspecific skin eruption: Secondary | ICD-10-CM

## 2015-01-06 LAB — COMPREHENSIVE METABOLIC PANEL (CC13)
ALT: 23 U/L (ref 0–55)
ANION GAP: 7 meq/L (ref 3–11)
AST: 21 U/L (ref 5–34)
Albumin: 3.6 g/dL (ref 3.5–5.0)
Alkaline Phosphatase: 75 U/L (ref 40–150)
BUN: 17 mg/dL (ref 7.0–26.0)
CO2: 26 meq/L (ref 22–29)
Calcium: 8.9 mg/dL (ref 8.4–10.4)
Chloride: 106 mEq/L (ref 98–109)
Creatinine: 0.8 mg/dL (ref 0.6–1.1)
EGFR: 73 mL/min/{1.73_m2} — ABNORMAL LOW (ref >90)
GLUCOSE: 92 mg/dL (ref 70–140)
Potassium: 3.9 mEq/L (ref 3.5–5.1)
SODIUM: 139 meq/L (ref 136–145)
Total Bilirubin: 0.84 mg/dL (ref 0.20–1.20)
Total Protein: 7.1 g/dL (ref 6.4–8.3)

## 2015-01-06 LAB — CBC WITH DIFFERENTIAL/PLATELET
BASO%: 0.1 % (ref 0.0–2.0)
Basophils Absolute: 0 10*3/uL (ref 0.0–0.1)
EOS%: 3.1 % (ref 0.0–7.0)
Eosinophils Absolute: 0.3 10*3/uL (ref 0.0–0.5)
HEMATOCRIT: 37.3 % (ref 34.8–46.6)
HGB: 12.6 g/dL (ref 11.6–15.9)
LYMPH#: 2.7 10*3/uL (ref 0.9–3.3)
LYMPH%: 28.2 % (ref 14.0–49.7)
MCH: 30.1 pg (ref 25.1–34.0)
MCHC: 33.6 g/dL (ref 31.5–36.0)
MCV: 89.6 fL (ref 79.5–101.0)
MONO#: 0.6 10*3/uL (ref 0.1–0.9)
MONO%: 6.5 % (ref 0.0–14.0)
NEUT%: 62.1 % (ref 38.4–76.8)
NEUTROS ABS: 5.9 10*3/uL (ref 1.5–6.5)
Platelets: 283 10*3/uL (ref 145–400)
RBC: 4.17 10*6/uL (ref 3.70–5.45)
RDW: 13.3 % (ref 11.2–14.5)
WBC: 9.6 10*3/uL (ref 3.9–10.3)

## 2015-01-06 NOTE — Progress Notes (Signed)
[place] Telephone:([phone]) [phone]   Fax:(336) [phone]  OFFICE PROGRESS NOTE  Default, Provider, MD No address on file  DIAGNOSIS: Stage IV (T1b, N3, M1b) non-small cell lung cancer, adenocarcinoma with positive EGFR mutation in exon 21 (L858R) presented with left upper lobe lung mass in addition to left hilar and bilateral mediastinal lymphadenopathy as well as liver metastasis diagnosed in May of 2015.  PRIOR THERAPY: Tarceva 150 mg by mouth daily. Started 11/25/2013. Status post 5 months of treatment discontinued today secondary to intolerance with persistent paronychia.  CURRENT THERAPY: Tarceva 100 mg by mouth daily. Started 06/03/2014. Status post 7 months of treatment.   INTERVAL HISTORY: Debra Hodges 64 y.o. female returns to the clinic today for followup visit. The patient is doing fine today with no specific complaints except for some residual paronychia in the big toes. Her paronychia has significantly improved after treatment by her podiatrist. She has intermittent diarrhea and mild skin rash on the face. She denied having any significant chest pain, shortness of breath, cough or hemoptysis. She denied having any significant weight loss or night sweats. She has no nausea or vomiting. The patient had repeat CT scan of the chest, abdomen and pelvis performed recently and she is here for evaluation and discussion of her scan results.  MEDICAL HISTORY: Past Medical History  Diagnosis Date  . High blood pressure   . lung ca dx'd 10/2013    ALLERGIES:  is allergic to penicillins.  MEDICATIONS:  Current Outpatient Prescriptions  Medication Sig Dispense Refill  . acetaminophen (TYLENOL) 325 MG tablet Take 650 mg by mouth every 6 (six) hours as needed.    . calcium carbonate (OS-CAL) 600 MG TABS tablet Take 600 mg by mouth daily.     Marland Kitchen CLIMARA 0.1 MG/24HR patch 1 patch once a week.    . clindamycin (CLEOCIN T) 1 % lotion APPLY TO AFFECTED AREA(S) TWICE  DAILY. 60 mL 0  . erlotinib (TARCEVA) 100 MG tablet Take 1 tablet (100 mg total) by mouth daily. Take on an empty stomach 1 hour before meals or 2 hours after. 30 tablet 2  . levothyroxine (SYNTHROID, LEVOTHROID) 25 MCG tablet Take 25 mcg by mouth daily before breakfast.    . loperamide (IMODIUM) 2 MG capsule Take by mouth as needed for diarrhea or loose stools.    . metroNIDAZOLE (METROCREAM) 0.75 % cream Apply 1 application topically daily.    . Multiple Vitamins-Calcium (ONE-A-DAY WOMENS PO) Take 1 tablet by mouth daily.    . potassium chloride SA (K-DUR,KLOR-CON) 20 MEQ tablet 1 tablet 2 (two) times daily.    . silver nitrate applicators 10-17 % applicator Apply topically 3 (three) times a week. 100 each 0  . spironolactone (ALDACTONE) 25 MG tablet 1 tablet daily.    . vitamin C (ASCORBIC ACID) 500 MG tablet Take 500 mg by mouth daily.     No current facility-administered medications for this visit.    SURGICAL HISTORY:  Past Surgical History  Procedure Laterality Date  . Abdominal hysterectomy    . Breast biopsy      Right breast  . Video bronchoscopy Bilateral 10/27/2013    Procedure: VIDEO BRONCHOSCOPY WITH FLUORO;  Surgeon: Collene Gobble, MD;  Location: [facility];  Service: Cardiopulmonary;  Laterality: Bilateral;    REVIEW OF SYSTEMS:  Constitutional: negative Eyes: negative Ears, nose, mouth, throat, and face: negative Respiratory: negative Cardiovascular: negative Gastrointestinal: negative Genitourinary:negative Integument/breast: negative Hematologic/lymphatic: negative Musculoskeletal:negative Neurological: negative Behavioral/Psych:  negative Endocrine: negative Allergic/Immunologic: negative   PHYSICAL EXAMINATION: General appearance: alert, cooperative and no distress Head: Normocephalic, without obvious abnormality, atraumatic Neck: no adenopathy, no JVD, supple, symmetrical, trachea midline and thyroid not enlarged, symmetric, no  tenderness/mass/nodules Lymph nodes: Cervical, supraclavicular, and axillary nodes normal. Resp: clear to auscultation bilaterally Back: symmetric, no curvature. ROM normal. No CVA tenderness. Cardio: regular rate and rhythm, S1, S2 normal, no murmur, click, rub or gallop GI: soft, non-tender; bowel sounds normal; no masses,  no organomegaly Extremities: extremities normal, atraumatic, no cyanosis or edema and With paronychia involving the left big toe. Neurologic: Alert and oriented X 3, normal strength and tone. Normal symmetric reflexes. Normal coordination and gait Skin exam showed mild maculopapular skin rash on the face and upper chest.  ECOG PERFORMANCE STATUS: 1 - Symptomatic but completely ambulatory  Blood pressure 125/54, pulse 76, temperature 98 F (36.7 C), temperature source Oral, resp. rate 18, height 5' 6" (1.676 m), weight 205 lb 8 oz (93.214 kg), SpO2 99 %.  LABORATORY DATA: Lab Results  Component Value Date   WBC 9.6 01/06/2015   HGB 12.6 01/06/2015   HCT 37.3 01/06/2015   MCV 89.6 01/06/2015   PLT 283 01/06/2015      Chemistry      Component Value Date/Time   NA 139 11/24/2014 0828   K 4.2 11/24/2014 0828   CO2 26 11/24/2014 0828   BUN 17.1 11/24/2014 0828   CREATININE 0.8 11/24/2014 0828   CREATININE 0.71 11/06/2013 1813      Component Value Date/Time   CALCIUM 8.7 11/24/2014 0828   ALKPHOS 81 11/24/2014 0828   AST 20 11/24/2014 0828   ALT 21 11/24/2014 0828   BILITOT 0.76 11/24/2014 0828       RADIOGRAPHIC STUDIES: Ct Chest W Contrast  12/24/2014   CLINICAL DATA:  Left lung cancer diagnosed in June 2015.  EXAM: CT CHEST, ABDOMEN, AND PELVIS WITH CONTRAST  TECHNIQUE: Multidetector CT imaging of the chest, abdomen and pelvis was performed following the standard protocol during bolus administration of intravenous contrast.  CONTRAST:  187m OMNIPAQUE IOHEXOL 300 MG/ML  SOLN  COMPARISON:  09/04/2014  FINDINGS: CT CHEST FINDINGS  Mediastinum/Nodes:  Small bilateral thyroid nodules appear stable.  Aortic arch atherosclerosis.  No pathologic adenopathy identified.  Lungs/Pleura: Lingular mass, 3.7 by 2.2 cm, previously the same by my measurements.  Musculoskeletal: Thoracic spondylosis.  CT ABDOMEN PELVIS FINDINGS  Hepatobiliary: Enlargement of the hypodense mass near the junction of the right and left hepatic lobes but most likely within segment 4a, currently 2.3 by 1.7 cm on image 46 series 2, formerly approximately 1 cm in diameter. Subtle 1.3 by 1.2 cm hypodense lesion in segment 2 on image 49 series 2, not seen on the prior exam.  Pancreas: Unremarkable  Spleen: Stable 8 mm hypodense lesion in the spleen, sharply defined, not previously hypermetabolic.  Adrenals/Urinary Tract: 2 mm nonobstructive left mid kidney calculus, image 62 series 2.  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  Stomach/Bowel: Unremarkable  Vascular/Lymphatic: Aortoiliac atherosclerotic vascular disease. Small retroperitoneal lymph nodes are not pathologically enlarged by size criteria.  Reproductive: There is increasing soft tissue prominence along the right side of the vaginal cuff. Oblique thickness in this vicinity is currently 2.2 cm, formerly 1.3 cm. Part of this fullness might potentially be due to the right ovary.  Other: No supplemental non-categorized findings.  Musculoskeletal: Degenerative facet arthropathy at L4-5 with grade 1 anterolisthesis.  IMPRESSION: 1.  Enlarging hypodense lesions in the liver suspicious for enlarging metastatic lesions. 2. Stable lingular mass. 3. Increasing soft tissue prominence along the right side of the vaginal cuff. Part of this may be due to the right ovary. Consider pelvic sonography for further characterization of the vaginal cuff, to exclude mass in this vicinity. 4. 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   Electronically Signed   By: Van Clines M.D.   On: 12/24/2014 08:31   Ct Abdomen Pelvis W Contrast  12/24/2014   CLINICAL DATA:  Left lung cancer diagnosed in June 2015.  EXAM: CT CHEST, ABDOMEN, AND PELVIS WITH CONTRAST  TECHNIQUE: Multidetector CT imaging of the chest, abdomen and pelvis was performed following the standard protocol during bolus administration of intravenous contrast.  CONTRAST:  176m OMNIPAQUE IOHEXOL 300 MG/ML  SOLN  COMPARISON:  09/04/2014  FINDINGS: CT CHEST FINDINGS  Mediastinum/Nodes: Small bilateral thyroid nodules appear stable.  Aortic arch atherosclerosis.  No pathologic adenopathy identified.  Lungs/Pleura: Lingular mass, 3.7 by 2.2 cm, previously the same by my measurements.  Musculoskeletal: Thoracic spondylosis.  CT ABDOMEN PELVIS FINDINGS  Hepatobiliary: Enlargement of the hypodense mass near the junction of the right and left hepatic lobes but most likely within segment 4a, currently 2.3 by 1.7 cm on image 46 series 2, formerly approximately 1 cm in diameter. Subtle 1.3 by 1.2 cm hypodense lesion in segment 2 on image 49 series 2, not seen on the prior exam.  Pancreas: Unremarkable  Spleen: Stable 8 mm hypodense lesion in the spleen, sharply defined, not previously hypermetabolic.  Adrenals/Urinary Tract: 2 mm nonobstructive left mid kidney calculus, image 62 series 2.  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  Stomach/Bowel: Unremarkable  Vascular/Lymphatic: Aortoiliac atherosclerotic vascular disease. Small retroperitoneal lymph nodes are not pathologically enlarged by size criteria.  Reproductive: There is increasing soft tissue prominence along the right side of the vaginal cuff. Oblique thickness in this vicinity is currently 2.2 cm, formerly 1.3 cm. Part of this fullness might  potentially be due to the right ovary.  Other: No supplemental non-categorized findings.  Musculoskeletal: Degenerative facet arthropathy at L4-5 with grade 1 anterolisthesis.  IMPRESSION: 1. Enlarging hypodense lesions in the liver suspicious for enlarging metastatic lesions. 2. Stable lingular mass. 3. Increasing soft tissue prominence along the right side of the vaginal cuff. Part of this may be due to the right ovary. Consider pelvic sonography for further characterization of the vaginal cuff, to exclude mass in this vicinity. 4. 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   Electronically Signed   By: WVan ClinesM.D.   On: 12/24/2014 08:31   ASSESSMENT AND PLAN: This is a very pleasant 64years old white female recently diagnosed with stage IV non-small cell lung cancer, adenocarcinoma with positive EGFR mutation in exon 21 (L858R). She was treated with Tarceva 150 mg by mouth daily status post 5 months of treatment and tolerating it fairly well except for the skin rash and occasional diarrhea as well as paronychia of the toes.  She is currently on  Tarceva 100 mg by mouth daily for the last 7 months and she is tolerating it much better.  The recent CT scan of the chest showed enlarging hypodense lesion in the liver concerning for disease progression. There was also a suspicious lesion along the right side of the vaginal cuff. I discussed the scan results with the patient today. I will arrange for the patient to have ultrasound of the pelvis for evaluation of the lesion along the vaginal cuff. I also ordered blood test for EGFR mutation to evaluate the patient for T790M resistant mutation. If the test is negative, I may consider repeating  ultrasound guided biopsy of the enlarging liver lesion for resistant mutation testing. The patient will continue on her current treatment with Tarceva 100 mg by mouth daily for now. She would come back for follow-up visit in 3 weeks after repeating CBC and comprehensive metabolic panel. For the skin rash,she will continue on clindamycin 1% lotion to be applied 2-3 times a day to the skin rash area. For diarrhea, she was advised to take Imodium as needed. She was advised to call immediately if she has any concerning symptoms in the interval. The patient voices understanding of current disease status and treatment options and is in agreement with the current care plan.  All questions were answered. The patient knows to call the clinic with any problems, questions or concerns. We can certainly see the patient much sooner if necessary.  Disclaimer: This note was dictated with voice recognition software. Similar sounding words can inadvertently be transcribed and may not be corrected upon review.

## 2015-01-06 NOTE — Telephone Encounter (Signed)
Pt confirmed labs/ov per 08/10 POF, gave pt avs and calendar... Debra Hodges

## 2015-01-12 ENCOUNTER — Ambulatory Visit (HOSPITAL_COMMUNITY): Payer: Managed Care, Other (non HMO)

## 2015-01-15 ENCOUNTER — Ambulatory Visit (HOSPITAL_COMMUNITY)
Admission: RE | Admit: 2015-01-15 | Discharge: 2015-01-15 | Disposition: A | Discharge status: Home / Self Care | Payer: Managed Care, Other (non HMO) | Source: Ambulatory Visit | Attending: Internal Medicine | Admitting: Internal Medicine

## 2015-01-15 DIAGNOSIS — Z90710 Acquired absence of both cervix and uterus: Secondary | ICD-10-CM | POA: Diagnosis not present

## 2015-01-15 DIAGNOSIS — N898 Other specified noninflammatory disorders of vagina: Secondary | ICD-10-CM

## 2015-01-15 DIAGNOSIS — C3412 Malignant neoplasm of upper lobe, left bronchus or lung: Secondary | ICD-10-CM

## 2015-01-19 ENCOUNTER — Telehealth: Payer: Self-pay | Admitting: Internal Medicine

## 2015-01-19 NOTE — Telephone Encounter (Signed)
sw, pt and advised on 8.30 appt moved to 9.2 due to MD out of the office.Marland KitchenMarland KitchenMarland KitchenMarland Kitchenpt ok and aware

## 2015-01-26 ENCOUNTER — Ambulatory Visit: Payer: Managed Care, Other (non HMO) | Admitting: Internal Medicine

## 2015-01-26 ENCOUNTER — Other Ambulatory Visit: Payer: Managed Care, Other (non HMO)

## 2015-01-29 ENCOUNTER — Telehealth: Payer: Self-pay | Admitting: Internal Medicine

## 2015-01-29 ENCOUNTER — Encounter: Payer: Self-pay | Admitting: Internal Medicine

## 2015-01-29 ENCOUNTER — Other Ambulatory Visit (HOSPITAL_BASED_OUTPATIENT_CLINIC_OR_DEPARTMENT_OTHER): Payer: Managed Care, Other (non HMO)

## 2015-01-29 ENCOUNTER — Telehealth: Payer: Self-pay | Admitting: *Deleted

## 2015-01-29 ENCOUNTER — Ambulatory Visit (HOSPITAL_BASED_OUTPATIENT_CLINIC_OR_DEPARTMENT_OTHER): Payer: Managed Care, Other (non HMO) | Admitting: Internal Medicine

## 2015-01-29 VITALS — BP 128/67 | HR 83 | Temp 98.0°F | Resp 18 | Ht 66.0 in | Wt 206.6 lb

## 2015-01-29 DIAGNOSIS — N898 Other specified noninflammatory disorders of vagina: Secondary | ICD-10-CM

## 2015-01-29 DIAGNOSIS — C787 Secondary malignant neoplasm of liver and intrahepatic bile duct: Secondary | ICD-10-CM | POA: Diagnosis not present

## 2015-01-29 DIAGNOSIS — C3412 Malignant neoplasm of upper lobe, left bronchus or lung: Secondary | ICD-10-CM | POA: Diagnosis not present

## 2015-01-29 LAB — COMPREHENSIVE METABOLIC PANEL (CC13)
ALK PHOS: 84 U/L (ref 40–150)
ALT: 19 U/L (ref 0–55)
AST: 21 U/L (ref 5–34)
Albumin: 3.5 g/dL (ref 3.5–5.0)
Anion Gap: 9 mEq/L (ref 3–11)
BUN: 15.3 mg/dL (ref 7.0–26.0)
CO2: 24 meq/L (ref 22–29)
Calcium: 9 mg/dL (ref 8.4–10.4)
Chloride: 107 mEq/L (ref 98–109)
Creatinine: 0.8 mg/dL (ref 0.6–1.1)
EGFR: 82 mL/min/{1.73_m2} — AB (ref >90)
Glucose: 84 mg/dl (ref 70–140)
Potassium: 3.9 mEq/L (ref 3.5–5.1)
SODIUM: 140 meq/L (ref 136–145)
Total Bilirubin: 0.74 mg/dL (ref 0.20–1.20)
Total Protein: 7.1 g/dL (ref 6.4–8.3)

## 2015-01-29 LAB — CBC WITH DIFFERENTIAL/PLATELET
BASO%: 0 % (ref 0.0–2.0)
BASOS ABS: 0 10*3/uL (ref 0.0–0.1)
EOS%: 3 % (ref 0.0–7.0)
Eosinophils Absolute: 0.3 10*3/uL (ref 0.0–0.5)
HCT: 37.3 % (ref 34.8–46.6)
HGB: 12.4 g/dL (ref 11.6–15.9)
LYMPH%: 28.2 % (ref 14.0–49.7)
MCH: 30 pg (ref 25.1–34.0)
MCHC: 33.2 g/dL (ref 31.5–36.0)
MCV: 90.1 fL (ref 79.5–101.0)
MONO#: 0.7 10*3/uL (ref 0.1–0.9)
MONO%: 6.8 % (ref 0.0–14.0)
NEUT#: 6.4 10*3/uL (ref 1.5–6.5)
NEUT%: 62 % (ref 38.4–76.8)
Platelets: 276 10*3/uL (ref 145–400)
RBC: 4.14 10*6/uL (ref 3.70–5.45)
RDW: 13.1 % (ref 11.2–14.5)
WBC: 10.3 10*3/uL (ref 3.9–10.3)
lymph#: 2.9 10*3/uL (ref 0.9–3.3)

## 2015-01-29 MED ORDER — OSIMERTINIB MESYLATE 80 MG PO TABS: 80.0000 mg | ORAL_TABLET | Freq: Every day | ORAL | Status: DC

## 2015-01-29 NOTE — Progress Notes (Signed)
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  PRIOR THERAPY:  1) Tarceva 150 mg by mouth daily. Started 11/25/2013. Status post 5 months of treatment discontinued today secondary to intolerance with persistent paronychia. 2) Tarceva 100 mg by mouth daily. Started 06/03/2014. Status post 8 months of treatment.   CURRENT THERAPY: Tagrisso 80 mg by mouth daily. Expected to start in the next few days.   INTERVAL HISTORY: Debra Hodges 64 y.o. female returns to the clinic today for followup visit accompanied by her husband. The patient is doing fine today with no specific complaints except for some residual paronychia in the big toes which significantly improved. She was found recently to have evidence for disease progression on restaging scan of the chest, abdomen and pelvis. I'll order blood tests for EGFR mutation analysis and the final result came consistent with T790M resistant mutation. The patient is here today for evaluation and discussion of her treatment options. She has intermittent diarrhea and mild skin rash on the face. She denied having any significant chest pain, shortness of breath, cough or hemoptysis. She denied having any significant weight loss or night sweats. She has no nausea or vomiting.   MEDICAL HISTORY: Past Medical History  Diagnosis Date  . High blood pressure   . lung ca dx'd 10/2013    ALLERGIES:  is allergic to penicillins.  MEDICATIONS:  Current Outpatient Prescriptions  Medication Sig Dispense Refill  . acetaminophen  (TYLENOL) 325 MG tablet Take 650 mg by mouth every 6 (six) hours as needed.    . calcium carbonate (OS-CAL) 600 MG TABS tablet Take 600 mg by mouth daily.     Marland Kitchen CLIMARA 0.1 MG/24HR patch 1 patch once a week.    . clindamycin (CLEOCIN T) 1 % lotion APPLY TO AFFECTED AREA(S) TWICE DAILY. 60 mL 0  . levothyroxine (SYNTHROID, LEVOTHROID) 25 MCG tablet Take 25 mcg by mouth daily before breakfast.    . loperamide (IMODIUM) 2 MG capsule Take by mouth as needed for diarrhea or loose stools.    . metroNIDAZOLE (METROCREAM) 0.75 % cream Apply 1 application topically daily.    . Multiple Vitamins-Calcium (ONE-A-DAY WOMENS PO) Take 1 tablet by mouth daily.    . potassium chloride SA (K-DUR,KLOR-CON) 20 MEQ tablet 1 tablet 2 (two) times daily.    . silver nitrate applicators 16-96 % applicator Apply topically 3 (three) times a week. 100 each 0  . spironolactone (ALDACTONE) 25 MG tablet 1 tablet daily.    . vitamin C (ASCORBIC ACID) 500 MG tablet Take 500 mg by mouth daily.    Marland Kitchen osimertinib mesylate (TAGRISSO) 80 MG tablet Take 1 tablet (80 mg total) by mouth daily. 30 tablet 2   No current facility-administered medications for this visit.    SURGICAL HISTORY:  Past Surgical History  Procedure Laterality Date  . Abdominal hysterectomy    . Breast biopsy      Right breast  . Video bronchoscopy Bilateral 10/27/2013    Procedure: VIDEO BRONCHOSCOPY WITH FLUORO;  Surgeon: Collene Gobble, MD;  Location: [facility];  Service: Cardiopulmonary;  Laterality: Bilateral;    REVIEW OF SYSTEMS:  Constitutional: negative Eyes: negative Ears, nose, mouth, throat, and face: negative Respiratory: negative Cardiovascular: negative Gastrointestinal: negative Genitourinary:negative Integument/breast: negative Hematologic/lymphatic: negative Musculoskeletal:negative Neurological: negative Behavioral/Psych: negative Endocrine: negative Allergic/Immunologic: negative   PHYSICAL EXAMINATION: General appearance:  alert, cooperative and no distress Head: Normocephalic, without obvious abnormality, atraumatic Neck: no adenopathy, no JVD, supple, symmetrical, trachea midline and thyroid not enlarged, symmetric, no tenderness/mass/nodules Lymph nodes: Cervical, supraclavicular, and axillary nodes normal. Resp: clear to auscultation bilaterally Back: symmetric, no curvature. ROM normal. No CVA tenderness. Cardio: regular rate and rhythm, S1, S2 normal, no murmur, click, rub or gallop GI: soft, non-tender; bowel sounds normal; no masses,  no organomegaly Extremities: extremities normal, atraumatic, no cyanosis or edema and With paronychia involving the left big toe. Neurologic: Alert and oriented X 3, normal strength and tone. Normal symmetric reflexes. Normal coordination and gait Skin exam showed mild maculopapular skin rash on the face and upper chest.  ECOG PERFORMANCE STATUS: 1 - Symptomatic but completely ambulatory  Blood pressure 128/67, pulse 83, temperature 98 F (36.7 C), temperature source Oral, resp. rate 18, height $RemoveBe'5\' 6"'bIhTgWbuM$  (1.676 m), weight 206 lb 9.6 oz (93.713 kg), SpO2 98 %.  LABORATORY DATA: Lab Results  Component Value Date   WBC 10.3 01/29/2015   HGB 12.4 01/29/2015   HCT 37.3 01/29/2015   MCV 90.1 01/29/2015   PLT 276 01/29/2015      Chemistry      Component Value Date/Time   NA 140 01/29/2015 0813   K 3.9 01/29/2015 0813   CO2 24 01/29/2015 0813   BUN 15.3 01/29/2015 0813   CREATININE 0.8 01/29/2015 0813   CREATININE 0.71 11/06/2013 1813      Component Value Date/Time   CALCIUM 9.0 01/29/2015 0813   ALKPHOS 84 01/29/2015 0813   AST 21 01/29/2015 0813   ALT 19 01/29/2015 0813   BILITOT 0.74 01/29/2015 0813       RADIOGRAPHIC STUDIES: US Transvaginal Non-ob  02-11-2015   CLINICAL DATA:  Soft tissue prominence of the right side of the vaginal cuff seen on CT scan of December 24, 2014 ; history of hysterectomy, possible previous unilateral oophorectomy  EXAM:  TRANSABDOMINAL AND TRANSVAGINAL ULTRASOUND OF PELVIS  TECHNIQUE: Both transabdominal and transvaginal ultrasound examinations of the pelvis were performed. Transabdominal technique was performed for global imaging of the pelvis including uterus, ovaries, adnexal regions, and pelvic cul-de-sac. It was necessary to proceed with endovaginal exam following the transabdominal exam to visualize the uterus and adnexal structures.  COMPARISON:  CT scan of the abdomen and pelvis of December 24, 2014.  FINDINGS: Uterus  The uterus is surgically absent. There is a 2.4 x 2.2 x 1.2 cm focus of slightly hypoechoic tissue on the right involving the vaginal cuff.  The ovaries are not definitely demonstrated.  Other findings  No free fluid.  IMPRESSION: The uterus is surgically absent and the ovaries are not discretely identified. In the area of clinical concern involving the right vaginal cuff there is a subtle hypoechoic region with dimensions as described which is felt to correspond to the CT findings. Speculum examination is recommended. If further imaging is desired, MRI would be a useful technique to allow better soft tissue delineation.   Electronically Signed   By: David  Martinique M.D.   On: 2015/02/11 11:13   US Pelvis Complete  02-11-15   CLINICAL DATA:  Soft tissue prominence of the right side of the vaginal cuff seen on CT  scan of December 24, 2014 ; history of hysterectomy, possible previous unilateral oophorectomy  EXAM: TRANSABDOMINAL AND TRANSVAGINAL ULTRASOUND OF PELVIS  TECHNIQUE: Both transabdominal and transvaginal ultrasound examinations of the pelvis were performed. Transabdominal technique was performed for global imaging of the pelvis including uterus, ovaries, adnexal regions, and pelvic cul-de-sac. It was necessary to proceed with endovaginal exam following the transabdominal exam to visualize the uterus and adnexal structures.  COMPARISON:  CT scan of the abdomen and pelvis of December 24, 2014.  FINDINGS: Uterus   The uterus is surgically absent. There is a 2.4 x 2.2 x 1.2 cm focus of slightly hypoechoic tissue on the right involving the vaginal cuff.  The ovaries are not definitely demonstrated.  Other findings  No free fluid.  IMPRESSION: The uterus is surgically absent and the ovaries are not discretely identified. In the area of clinical concern involving the right vaginal cuff there is a subtle hypoechoic region with dimensions as described which is felt to correspond to the CT findings. Speculum examination is recommended. If further imaging is desired, MRI would be a useful technique to allow better soft tissue delineation.   Electronically Signed   By: David  Martinique M.D.   On: 01/15/2015 11:13   ASSESSMENT AND PLAN: This is a very pleasant 64 years old white female recently diagnosed with stage IV non-small cell lung cancer, adenocarcinoma with positive EGFR mutation in exon 21 (L858R) and recently developed T790M resistant mutation. She was treated with Tarceva 150 mg by mouth daily status post 5 months of treatment and tolerating it fairly well except for the skin rash and occasional diarrhea as well as paronychia of the toes.  She is currently on Tarceva 100 mg by mouth daily for the last 8 months and she is tolerating it much better.  The recent molecular studies showed evidence for T790M resistant mutation. I discussed the results with the patient and her husband. I recommended for her to discontinue her current treatment with Tarceva. I discussed with the patient treatment with third generation EGFR tyrosine kinase inhibitor with Tagrisso, Osimeritinib. She would come back for follow-up visit in 3 weeks after repeating CBC and comprehensive metabolic panel. For the skin rash,she will continue on clindamycin 1% lotion to be applied 2-3 times a day to the skin rash area. For diarrhea, she was advised to take Imodium as needed. She was advised to call immediately if she has any concerning symptoms in the  interval. The patient voices understanding of current disease status and treatment options and is in agreement with the current care plan.  All questions were answered. The patient knows to call the clinic with any problems, questions or concerns. We can certainly see the patient much sooner if necessary.  Disclaimer: This note was dictated with voice recognition software. Similar sounding words can inadvertently be transcribed and may not be corrected upon review.

## 2015-01-29 NOTE — Telephone Encounter (Signed)
Pt called states " I went to the pharmacy to get my medication filled but they said will need PA prior to being filled" Call to [place], PA was faxed to Raquel, upon PA; medicaiton will be ordered and available Tuesday 02/02/15 Message forward to MD

## 2015-01-29 NOTE — Telephone Encounter (Signed)
Gave adn printed appt sched and avs for pt for Sept °

## 2015-01-29 NOTE — Progress Notes (Signed)
I faxed prior auth tagrisso to [employer]

## 2015-02-05 ENCOUNTER — Encounter: Payer: Self-pay | Admitting: Internal Medicine

## 2015-02-05 ENCOUNTER — Telehealth: Payer: Self-pay | Admitting: *Deleted

## 2015-02-05 NOTE — Telephone Encounter (Signed)
PT. IS CALLING ABOUT THE PRIOR AUTHORIZATION FOR TAGRISSO. PLEASE CALL PT. TO UPDATE HER ON THE STATUS OF THE PRIOR AUTHORIZATION FOR THE TAGRISSO. PT.'S PHONE NUMBER [phone].

## 2015-02-05 NOTE — Progress Notes (Signed)
Per yanya at [employer] was denied. I sent her office notes  [phone] case# [id number]. I advised Bethena Roys at [place] and Marcie Bal of notes being sent for appeal of denial. The patient will be notified when they can fill it.

## 2015-02-05 NOTE — Telephone Encounter (Signed)
PT. CALLED ABOUT THE PRIOR AUTHORIZATION FOR TAGRISSO. INFORMED PT. THAT THE INSURANCE COMPANY DENIED THE PA. RAQUEL BROWNING IN [facility] IS FAXING OFFICE NOTES TO THE INSURANCE COMPANY FOR REVIEW. PT. WAS GIVEN RAQUEL BROWNING'S PHONE NUMBER TO CALL FOR ANY FURTHER QUESTIONS CONCERNING THE PRIOR AUTHORIZATION FOR TAGRISSO.

## 2015-02-08 ENCOUNTER — Encounter: Payer: Self-pay | Admitting: Internal Medicine

## 2015-02-08 NOTE — Progress Notes (Signed)
I placed prior auth form on desk of nurse for dr. Julien Nordmann See prev notes.

## 2015-02-09 ENCOUNTER — Encounter: Payer: Self-pay | Admitting: Internal Medicine

## 2015-02-09 ENCOUNTER — Telehealth: Payer: Self-pay | Admitting: Medical Oncology

## 2015-02-09 NOTE — Progress Notes (Signed)
I faxed prior auth form to [employer]  [phone]

## 2015-02-09 NOTE — Telephone Encounter (Signed)
I told pt to call when she received tagrisso.

## 2015-02-09 NOTE — Telephone Encounter (Signed)
-----   Message from Ardeen Garland, RN sent at 02/08/2015  2:27 PM EDT ----- R/s 9/29 appt based on when pt gets tagrisso

## 2015-02-12 ENCOUNTER — Encounter: Payer: Self-pay | Admitting: Internal Medicine

## 2015-02-12 NOTE — Progress Notes (Signed)
I called and left the patient a message we are still waiting on approval from her insurance and I will call her once we got it approved.  I called and spoke with inga and she said all she sees is the denial. I faxed office notes/lab again to [phone] with case# [id number]

## 2015-02-17 ENCOUNTER — Encounter: Payer: Self-pay | Admitting: Medical Oncology

## 2015-02-17 NOTE — Progress Notes (Signed)
Per [employer] denied tagrisso. Note sent to Raquel .

## 2015-02-18 ENCOUNTER — Telehealth: Payer: Self-pay | Admitting: Medical Oncology

## 2015-02-18 ENCOUNTER — Encounter: Payer: Self-pay | Admitting: Internal Medicine

## 2015-02-18 NOTE — Progress Notes (Signed)
I sent [employer] application to the patient for poss asst with targrisso. [employer] is out of funds for her diagnosis.

## 2015-02-18 NOTE — Telephone Encounter (Signed)
Dr Weston Brass [phone]) called with information of Prior auth number [id number] case number. Instructions were given for [facility] to call [employer] MD ( number [phone], then [phone]) . Anne's insurance was terminated per Dr Worthy Flank conversation.  I left message for pt to call.

## 2015-02-18 NOTE — Telephone Encounter (Signed)
I told pt that we are going to go through [employer] pt assistance. Debra Hodges said her insurance is not terminated. Note sent to raquel to start Pt assistance program through [employer].

## 2015-02-18 NOTE — Progress Notes (Signed)
Per Darrick Penna at [employer] has been denied. They need measure of left ventricular ejection fraction(lvef). I sent message to dr. Julien Nordmann and [name]. Dr. Erick Blinks   [phone] medical director(didn't get name of facility-she was talking fast) she left message if dr. Julien Nordmann needs asst in getting it approved to let her know.

## 2015-02-25 ENCOUNTER — Ambulatory Visit (HOSPITAL_BASED_OUTPATIENT_CLINIC_OR_DEPARTMENT_OTHER): Payer: Managed Care, Other (non HMO) | Admitting: Internal Medicine

## 2015-02-25 ENCOUNTER — Ambulatory Visit (HOSPITAL_COMMUNITY)
Admission: RE | Admit: 2015-02-25 | Discharge: 2015-02-25 | Disposition: A | Discharge status: Home / Self Care | Payer: Managed Care, Other (non HMO) | Source: Ambulatory Visit | Attending: Internal Medicine | Admitting: Internal Medicine

## 2015-02-25 ENCOUNTER — Telehealth: Payer: Self-pay | Admitting: Internal Medicine

## 2015-02-25 ENCOUNTER — Other Ambulatory Visit (HOSPITAL_BASED_OUTPATIENT_CLINIC_OR_DEPARTMENT_OTHER): Payer: Managed Care, Other (non HMO)

## 2015-02-25 VITALS — BP 144/56 | HR 92 | Temp 98.3°F | Resp 17 | Ht 66.0 in | Wt 207.7 lb

## 2015-02-25 DIAGNOSIS — C3412 Malignant neoplasm of upper lobe, left bronchus or lung: Secondary | ICD-10-CM

## 2015-02-25 DIAGNOSIS — C787 Secondary malignant neoplasm of liver and intrahepatic bile duct: Secondary | ICD-10-CM

## 2015-02-25 LAB — COMPREHENSIVE METABOLIC PANEL (CC13)
ALT: 24 U/L (ref 0–55)
AST: 27 U/L (ref 5–34)
Albumin: 3.7 g/dL (ref 3.5–5.0)
Alkaline Phosphatase: 104 U/L (ref 40–150)
Anion Gap: 9 mEq/L (ref 3–11)
BUN: 15.6 mg/dL (ref 7.0–26.0)
CO2: 26 meq/L (ref 22–29)
Calcium: 9 mg/dL (ref 8.4–10.4)
Chloride: 106 mEq/L (ref 98–109)
Creatinine: 0.8 mg/dL (ref 0.6–1.1)
EGFR: 77 mL/min/{1.73_m2} — AB (ref >90)
Glucose: 90 mg/dl (ref 70–140)
POTASSIUM: 4 meq/L (ref 3.5–5.1)
SODIUM: 140 meq/L (ref 136–145)
Total Bilirubin: 0.47 mg/dL (ref 0.20–1.20)
Total Protein: 7.3 g/dL (ref 6.4–8.3)

## 2015-02-25 LAB — CBC WITH DIFFERENTIAL/PLATELET
BASO%: 0.6 % (ref 0.0–2.0)
BASOS ABS: 0.1 10*3/uL (ref 0.0–0.1)
EOS%: 3.9 % (ref 0.0–7.0)
Eosinophils Absolute: 0.4 10*3/uL (ref 0.0–0.5)
HCT: 38.5 % (ref 34.8–46.6)
HGB: 12.7 g/dL (ref 11.6–15.9)
LYMPH%: 30.2 % (ref 14.0–49.7)
MCH: 29.6 pg (ref 25.1–34.0)
MCHC: 32.9 g/dL (ref 31.5–36.0)
MCV: 89.9 fL (ref 79.5–101.0)
MONO#: 0.7 10*3/uL (ref 0.1–0.9)
MONO%: 7.1 % (ref 0.0–14.0)
NEUT%: 58.2 % (ref 38.4–76.8)
NEUTROS ABS: 5.9 10*3/uL (ref 1.5–6.5)
Platelets: 294 10*3/uL (ref 145–400)
RBC: 4.28 10*6/uL (ref 3.70–5.45)
RDW: 12.8 % (ref 11.2–14.5)
WBC: 10.1 10*3/uL (ref 3.9–10.3)
lymph#: 3 10*3/uL (ref 0.9–3.3)

## 2015-02-25 NOTE — Progress Notes (Signed)
[place] Telephone:(336) [phone]   Fax:(336) [phone]  OFFICE PROGRESS NOTE  Default, Provider, MD No address on file  DIAGNOSIS: Stage IV (T1b, N3, M1b) non-small cell lung cancer, adenocarcinoma with positive EGFR mutation in exon 21 (L858R) and recently found to have T790M resistant mutation, presented with left upper lobe lung mass in addition to left hilar and bilateral mediastinal lymphadenopathy as well as liver metastasis diagnosed in May of 2015.  PRIOR THERAPY:  1) Tarceva 150 mg by mouth daily. Started 11/25/2013. Status post 5 months of treatment discontinued today secondary to intolerance with persistent paronychia. 2) Tarceva 100 mg by mouth daily. Started 06/03/2014. Status post 8 months of treatment.   CURRENT THERAPY: Tagrisso 80 mg by mouth daily. Expected to start in the next few days.   INTERVAL HISTORY: Debra Hodges 64 y.o. female returns to the clinic today for followup visit accompanied by her husband. The patient start complaining of increasing fatigue and weakness as well as pain on the right side of the chest and shortness breath with exertion. She was found recently to have T790M EGFR resistant mutation. I recommended for the patient to start treatment with Tagrisso 80 mg by mouth daily but her insurance declined the approval of the drug until the patient has 2-D echo. She has intermittent diarrhea and mild skin rash on the face. She denied having any significant chest pain, shortness of breath, cough or hemoptysis. She denied having any significant weight loss or night sweats. She has no nausea or vomiting. She is here today for reevaluation.  MEDICAL HISTORY: Past Medical History  Diagnosis Date  . High blood pressure   . lung ca dx'd 10/2013    ALLERGIES:  is allergic to penicillins.  MEDICATIONS:  Current Outpatient Prescriptions  Medication Sig Dispense Refill  . acetaminophen (TYLENOL) 325 MG tablet Take 650 mg by mouth every 6  (six) hours as needed.    . calcium carbonate (OS-CAL) 600 MG TABS tablet Take 600 mg by mouth daily.     Marland Kitchen CLIMARA 0.1 MG/24HR patch 1 patch once a week.    . clindamycin (CLEOCIN T) 1 % lotion APPLY TO AFFECTED AREA(S) TWICE DAILY. 60 mL 0  . levothyroxine (SYNTHROID, LEVOTHROID) 25 MCG tablet Take 25 mcg by mouth daily before breakfast.    . loperamide (IMODIUM) 2 MG capsule Take by mouth as needed for diarrhea or loose stools.    . metroNIDAZOLE (METROCREAM) 0.75 % cream Apply 1 application topically daily.    . Multiple Vitamins-Calcium (ONE-A-DAY WOMENS PO) Take 1 tablet by mouth daily.    . potassium chloride SA (K-DUR,KLOR-CON) 20 MEQ tablet 1 tablet 2 (two) times daily.    . silver nitrate applicators 82-99 % applicator Apply topically 3 (three) times a week. 100 each 0  . spironolactone (ALDACTONE) 25 MG tablet 1 tablet daily.    . vitamin C (ASCORBIC ACID) 500 MG tablet Take 500 mg by mouth daily.    Marland Kitchen osimertinib mesylate (TAGRISSO) 80 MG tablet Take 1 tablet (80 mg total) by mouth daily. (Patient not taking: Reported on 02/25/2015) 30 tablet 2   No current facility-administered medications for this visit.    SURGICAL HISTORY:  Past Surgical History  Procedure Laterality Date  . Abdominal hysterectomy    . Breast biopsy      Right breast  . Video bronchoscopy Bilateral 10/27/2013    Procedure: VIDEO BRONCHOSCOPY WITH FLUORO;  Surgeon: Collene Gobble, MD;  Location: [facility];  Service: Cardiopulmonary;  Laterality: Bilateral;    REVIEW OF SYSTEMS:  Constitutional: negative Eyes: negative Ears, nose, mouth, throat, and face: negative Respiratory: positive for dyspnea on exertion and pleurisy/chest pain Cardiovascular: negative Gastrointestinal: negative Genitourinary:negative Integument/breast: negative Hematologic/lymphatic: negative Musculoskeletal:negative Neurological: negative Behavioral/Psych: negative Endocrine: negative Allergic/Immunologic: negative    PHYSICAL EXAMINATION: General appearance: alert, cooperative and no distress Head: Normocephalic, without obvious abnormality, atraumatic Neck: no adenopathy, no JVD, supple, symmetrical, trachea midline and thyroid not enlarged, symmetric, no tenderness/mass/nodules Lymph nodes: Cervical, supraclavicular, and axillary nodes normal. Resp: clear to auscultation bilaterally Back: symmetric, no curvature. ROM normal. No CVA tenderness. Cardio: regular rate and rhythm, S1, S2 normal, no murmur, click, rub or gallop GI: soft, non-tender; bowel sounds normal; no masses,  no organomegaly Extremities: extremities normal, atraumatic, no cyanosis or edema and With paronychia involving the left big toe. Neurologic: Alert and oriented X 3, normal strength and tone. Normal symmetric reflexes. Normal coordination and gait   ECOG PERFORMANCE STATUS: 1 - Symptomatic but completely ambulatory  Blood pressure 144/56, pulse 92, temperature 98.3 F (36.8 C), temperature source Oral, resp. rate 17, height _0  (1.676 m), weight 207 lb 11.2 oz (94.212 kg), SpO2 100 %.  LABORATORY DATA: Lab Results  Component Value Date   WBC 10.1 02/25/2015   HGB 12.7 02/25/2015   HCT 38.5 02/25/2015   MCV 89.9 02/25/2015   PLT 294 02/25/2015      Chemistry      Component Value Date/Time   NA 140 01/29/2015 0813   K 3.9 01/29/2015 0813   CO2 24 01/29/2015 0813   BUN 15.3 01/29/2015 0813   CREATININE 0.8 01/29/2015 0813   CREATININE 0.71 11/06/2013 1813      Component Value Date/Time   CALCIUM 9.0 01/29/2015 0813   ALKPHOS 84 01/29/2015 0813   AST 21 01/29/2015 0813   ALT 19 01/29/2015 0813   BILITOT 0.74 01/29/2015 0813       RADIOGRAPHIC STUDIES: No results found. ASSESSMENT AND PLAN: This is a very pleasant 64 years old white female recently diagnosed with stage IV non-small cell lung cancer, adenocarcinoma with positive EGFR mutation in exon 21 (L858R) and recently developed T790M resistant  mutation. She was treated with Tarceva 150 mg by mouth daily status post 5 months of treatment and tolerating it fairly well except for the skin rash and occasional diarrhea as well as paronychia of the toes.  She is currently on Tarceva 100 mg by mouth daily for the last 8 months and she is tolerating it much better.  The recent molecular studies showed evidence for T790M resistant mutation. I recommended for the patient to start treatment with Tagrisso but unfortunately her insurance had declined the approval of the drug until the patient has 2-D echo. The patient has no history of cardiac disease. I will proceed with 2-D echo today and also order a EKG. Hopefully her insurance will approve the drug in the next few days. She would come back for follow-up visit in 3 weeks after repeating CBC and comprehensive metabolic panel. For the skin rash,she will continue on clindamycin 1% lotion to be applied 2-3 times a day to the skin rash area. For diarrhea, she was advised to take Imodium as needed. She was advised to call immediately if she has any concerning symptoms in the interval. The patient voices understanding of current disease status and treatment options and is in agreement with the current care plan.  All questions were answered. The patient  knows to call the clinic with any problems, questions or concerns. We can certainly see the patient much sooner if necessary.  Disclaimer: This note was dictated with voice recognition software. Similar sounding words can inadvertently be transcribed and may not be corrected upon review.

## 2015-02-25 NOTE — Telephone Encounter (Signed)
Pt confirmed labs/ov per 09/29 POF, gave pt AVS and Calendar... KJ

## 2015-02-25 NOTE — Progress Notes (Signed)
  Echocardiogram 2D Echocardiogram has been performed.  Debra Hodges 02/25/2015, 10:53 AM

## 2015-02-26 ENCOUNTER — Telehealth: Payer: Self-pay | Admitting: *Deleted

## 2015-02-26 ENCOUNTER — Encounter: Payer: Self-pay | Admitting: Internal Medicine

## 2015-02-26 NOTE — Telephone Encounter (Signed)
Pt echo results faxed to Amy at [facility]

## 2015-02-26 NOTE — Telephone Encounter (Signed)
Triage spoke with patient.  "Need to talk with Dr. Julien Nordmann or his nurse.  Was information sent off from test?"  Informed her of today's documentation by [facility].  Thanked me for this information.

## 2015-03-03 ENCOUNTER — Telehealth: Payer: Self-pay | Admitting: Medical Oncology

## 2015-03-03 NOTE — Telephone Encounter (Signed)
tagrisso wil be appealed oct 14th [employer]

## 2015-03-08 ENCOUNTER — Telehealth: Payer: Self-pay | Admitting: *Deleted

## 2015-03-08 NOTE — Telephone Encounter (Signed)
"  I received Tagrisso medication and started taking it on 03-04-2015."  Next scheduled F/U 03-17-2015

## 2015-03-17 ENCOUNTER — Telehealth: Payer: Self-pay | Admitting: Internal Medicine

## 2015-03-17 ENCOUNTER — Other Ambulatory Visit (HOSPITAL_BASED_OUTPATIENT_CLINIC_OR_DEPARTMENT_OTHER): Payer: Managed Care, Other (non HMO)

## 2015-03-17 ENCOUNTER — Ambulatory Visit (HOSPITAL_BASED_OUTPATIENT_CLINIC_OR_DEPARTMENT_OTHER): Payer: Managed Care, Other (non HMO) | Admitting: Internal Medicine

## 2015-03-17 ENCOUNTER — Encounter: Payer: Self-pay | Admitting: Internal Medicine

## 2015-03-17 VITALS — BP 135/50 | HR 74 | Temp 97.9°F | Resp 18 | Ht 66.0 in | Wt 209.6 lb

## 2015-03-17 DIAGNOSIS — C787 Secondary malignant neoplasm of liver and intrahepatic bile duct: Secondary | ICD-10-CM

## 2015-03-17 DIAGNOSIS — C3412 Malignant neoplasm of upper lobe, left bronchus or lung: Secondary | ICD-10-CM

## 2015-03-17 LAB — CBC WITH DIFFERENTIAL/PLATELET
BASO%: 0.3 % (ref 0.0–2.0)
Basophils Absolute: 0 10*3/uL (ref 0.0–0.1)
EOS%: 4.4 % (ref 0.0–7.0)
Eosinophils Absolute: 0.4 10*3/uL (ref 0.0–0.5)
HCT: 38.3 % (ref 34.8–46.6)
HGB: 12.8 g/dL (ref 11.6–15.9)
LYMPH%: 28.1 % (ref 14.0–49.7)
MCH: 29.8 pg (ref 25.1–34.0)
MCHC: 33.3 g/dL (ref 31.5–36.0)
MCV: 89.3 fL (ref 79.5–101.0)
MONO#: 0.6 10*3/uL (ref 0.1–0.9)
MONO%: 6.6 % (ref 0.0–14.0)
NEUT%: 60.6 % (ref 38.4–76.8)
NEUTROS ABS: 5.2 10*3/uL (ref 1.5–6.5)
Platelets: 211 10*3/uL (ref 145–400)
RBC: 4.29 10*6/uL (ref 3.70–5.45)
RDW: 12.3 % (ref 11.2–14.5)
WBC: 8.5 10*3/uL (ref 3.9–10.3)
lymph#: 2.4 10*3/uL (ref 0.9–3.3)

## 2015-03-17 LAB — COMPREHENSIVE METABOLIC PANEL (CC13)
ALBUMIN: 3.9 g/dL (ref 3.5–5.0)
ALT: 17 U/L (ref 0–55)
AST: 20 U/L (ref 5–34)
Alkaline Phosphatase: 93 U/L (ref 40–150)
Anion Gap: 8 mEq/L (ref 3–11)
BUN: 13.2 mg/dL (ref 7.0–26.0)
CO2: 26 meq/L (ref 22–29)
Calcium: 9.7 mg/dL (ref 8.4–10.4)
Chloride: 106 mEq/L (ref 98–109)
Creatinine: 0.9 mg/dL (ref 0.6–1.1)
EGFR: 72 mL/min/{1.73_m2} — AB (ref >90)
Glucose: 85 mg/dl (ref 70–140)
POTASSIUM: 4.4 meq/L (ref 3.5–5.1)
Sodium: 140 mEq/L (ref 136–145)
TOTAL PROTEIN: 7.8 g/dL (ref 6.4–8.3)
Total Bilirubin: 0.41 mg/dL (ref 0.20–1.20)

## 2015-03-17 NOTE — Progress Notes (Signed)
[place] Telephone:(336) [phone]   Fax:(336) [phone]  OFFICE PROGRESS NOTE  Default, Provider, MD No address on file  DIAGNOSIS: Stage IV (T1b, N3, M1b) non-small cell lung cancer, adenocarcinoma with positive EGFR mutation in exon 21 (L858R) and recently found to have T790M resistant mutation, presented with left upper lobe lung mass in addition to left hilar and bilateral mediastinal lymphadenopathy as well as liver metastasis diagnosed in May of 2015.  PRIOR THERAPY:  1) Tarceva 150 mg by mouth daily. Started 11/25/2013. Status post 5 months of treatment discontinued today secondary to intolerance with persistent paronychia. 2) Tarceva 100 mg by mouth daily. Started 06/03/2014. Status post 8 months of treatment.   CURRENT THERAPY: Tagrisso 80 mg by mouth daily. First dose started 03/04/2015.   INTERVAL HISTORY: Debra Hodges 64 y.o. female returns to the clinic today for followup visit. The patient is feeling fine today and recovering from recent cold symptoms. She was started on treatment with Tagrisso 80 mg by mouth daily on 03/04/2015 and has been tolerating her treatment well with no significant adverse effects. She denied having any significant skin rash or diarrhea. She denied having any significant chest pain, shortness of breath, cough or hemoptysis. She denied having any significant weight loss or night sweats. She has no nausea or vomiting. She had repeat CBC and compliance metabolic panel performed earlier today and she is here for evaluation and discussion of her lab results.  MEDICAL HISTORY: Past Medical History  Diagnosis Date  . High blood pressure   . lung ca dx'd 10/2013    ALLERGIES:  is allergic to penicillins.  MEDICATIONS:  Current Outpatient Prescriptions  Medication Sig Dispense Refill  . acetaminophen (TYLENOL) 325 MG tablet Take 650 mg by mouth every 6 (six) hours as needed.    . calcium carbonate (OS-CAL) 600 MG TABS tablet Take  600 mg by mouth daily.     Marland Kitchen CLIMARA 0.1 MG/24HR patch 1 patch once a week.    . levothyroxine (SYNTHROID, LEVOTHROID) 25 MCG tablet Take 25 mcg by mouth daily before breakfast.    . metroNIDAZOLE (METROCREAM) 0.75 % cream Apply 1 application topically daily.    . Multiple Vitamins-Calcium (ONE-A-DAY WOMENS PO) Take 1 tablet by mouth daily.    . Osimertinib Mesylate (TAGRISSO PO) Take 80 mg by mouth daily.  2  . potassium chloride SA (K-DUR,KLOR-CON) 20 MEQ tablet 1 tablet 2 (two) times daily.    Marland Kitchen spironolactone (ALDACTONE) 25 MG tablet 1 tablet daily.    . vitamin C (ASCORBIC ACID) 500 MG tablet Take 500 mg by mouth daily.    . clindamycin (CLEOCIN T) 1 % lotion APPLY TO AFFECTED AREA(S) TWICE DAILY. (Patient not taking: Reported on 03/17/2015) 60 mL 0  . loperamide (IMODIUM) 2 MG capsule Take by mouth as needed for diarrhea or loose stools.    . silver nitrate applicators 66-06 % applicator Apply topically 3 (three) times a week. (Patient not taking: Reported on 03/17/2015) 100 each 0   No current facility-administered medications for this visit.    SURGICAL HISTORY:  Past Surgical History  Procedure Laterality Date  . Abdominal hysterectomy    . Breast biopsy      Right breast  . Video bronchoscopy Bilateral 10/27/2013    Procedure: VIDEO BRONCHOSCOPY WITH FLUORO;  Surgeon: Collene Gobble, MD;  Location: [facility];  Service: Cardiopulmonary;  Laterality: Bilateral;    REVIEW OF SYSTEMS:  A comprehensive review of systems was  negative.   PHYSICAL EXAMINATION: General appearance: alert, cooperative and no distress Head: Normocephalic, without obvious abnormality, atraumatic Neck: no adenopathy, no JVD, supple, symmetrical, trachea midline and thyroid not enlarged, symmetric, no tenderness/mass/nodules Lymph nodes: Cervical, supraclavicular, and axillary nodes normal. Resp: clear to auscultation bilaterally Back: symmetric, no curvature. ROM normal. No CVA tenderness. Cardio:  regular rate and rhythm, S1, S2 normal, no murmur, click, rub or gallop GI: soft, non-tender; bowel sounds normal; no masses,  no organomegaly Extremities: extremities normal, atraumatic, no cyanosis or edema and With paronychia involving the left big toe. Neurologic: Alert and oriented X 3, normal strength and tone. Normal symmetric reflexes. Normal coordination and gait   ECOG PERFORMANCE STATUS: 1 - Symptomatic but completely ambulatory  Blood pressure 135/50, pulse 74, temperature 97.9 F (36.6 C), temperature source Oral, resp. rate 18, height $RemoveBe'5\' 6"'QuPmppxLL$  (1.676 m), weight 209 lb 9.6 oz (95.074 kg), SpO2 100 %.  LABORATORY DATA: Lab Results  Component Value Date   WBC 8.5 03/17/2015   HGB 12.8 03/17/2015   HCT 38.3 03/17/2015   MCV 89.3 03/17/2015   PLT 211 03/17/2015      Chemistry      Component Value Date/Time   NA 140 03/17/2015 1425   K 4.4 03/17/2015 1425   CO2 26 03/17/2015 1425   BUN 13.2 03/17/2015 1425   CREATININE 0.9 03/17/2015 1425   CREATININE 0.71 11/06/2013 1813      Component Value Date/Time   CALCIUM 9.7 03/17/2015 1425   ALKPHOS 93 03/17/2015 1425   AST 20 03/17/2015 1425   ALT 17 03/17/2015 1425   BILITOT 0.41 03/17/2015 1425       RADIOGRAPHIC STUDIES: No results found. 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 She was treated with Tarceva 150 mg by mouth daily status post 5 months of treatment and tolerating it fairly well except for the skin rash and occasional diarrhea as well as paronychia of the toes.  She completed treatment with Tarceva 100 mg by mouth daily for the last 8 months. This was discontinued secondary to disease progression. The recent molecular studies showed evidence for T790M resistant mutation. The patient was started on treatment  with Tagrisso 80 mg by mouth daily on 03/04/2015 and has been tolerating her treatment fairly well with no significant adverse effects. She was advised to call immediately if she has any concerning symptoms in the interval. The patient voices understanding of current disease status and treatment options and is in agreement with the current care plan.  All questions were answered. The patient knows to call the clinic with any problems, questions or concerns. We can certainly see the patient much sooner if necessary.  Disclaimer: This note was dictated with voice recognition software. Similar sounding words can inadvertently be transcribed and may not be corrected upon review.

## 2015-03-17 NOTE — Telephone Encounter (Signed)
Gave adn printed appt sched and avs fo rpt for NOV °

## 2015-04-07 ENCOUNTER — Other Ambulatory Visit (HOSPITAL_BASED_OUTPATIENT_CLINIC_OR_DEPARTMENT_OTHER): Payer: Medicare Other

## 2015-04-07 ENCOUNTER — Encounter: Payer: Self-pay | Admitting: Physician Assistant

## 2015-04-07 ENCOUNTER — Telehealth: Payer: Self-pay | Admitting: Internal Medicine

## 2015-04-07 ENCOUNTER — Ambulatory Visit (HOSPITAL_BASED_OUTPATIENT_CLINIC_OR_DEPARTMENT_OTHER): Payer: Medicare Other | Admitting: Physician Assistant

## 2015-04-07 VITALS — BP 143/66 | HR 85 | Temp 97.6°F | Resp 18 | Ht 66.0 in | Wt 212.7 lb

## 2015-04-07 DIAGNOSIS — R197 Diarrhea, unspecified: Secondary | ICD-10-CM

## 2015-04-07 DIAGNOSIS — L03031 Cellulitis of right toe: Secondary | ICD-10-CM

## 2015-04-07 DIAGNOSIS — L03032 Cellulitis of left toe: Secondary | ICD-10-CM | POA: Diagnosis not present

## 2015-04-07 DIAGNOSIS — C3412 Malignant neoplasm of upper lobe, left bronchus or lung: Secondary | ICD-10-CM | POA: Diagnosis not present

## 2015-04-07 DIAGNOSIS — R21 Rash and other nonspecific skin eruption: Secondary | ICD-10-CM

## 2015-04-07 DIAGNOSIS — C787 Secondary malignant neoplasm of liver and intrahepatic bile duct: Secondary | ICD-10-CM

## 2015-04-07 DIAGNOSIS — C3490 Malignant neoplasm of unspecified part of unspecified bronchus or lung: Secondary | ICD-10-CM | POA: Insufficient documentation

## 2015-04-07 LAB — CBC WITH DIFFERENTIAL/PLATELET
BASO%: 0.7 % (ref 0.0–2.0)
BASOS ABS: 0.1 10*3/uL (ref 0.0–0.1)
EOS ABS: 0.3 10*3/uL (ref 0.0–0.5)
EOS%: 4 % (ref 0.0–7.0)
HCT: 36.7 % (ref 34.8–46.6)
HEMOGLOBIN: 12.2 g/dL (ref 11.6–15.9)
LYMPH%: 24.3 % (ref 14.0–49.7)
MCH: 29.2 pg (ref 25.1–34.0)
MCHC: 33.2 g/dL (ref 31.5–36.0)
MCV: 87.8 fL (ref 79.5–101.0)
MONO#: 0.7 10*3/uL (ref 0.1–0.9)
MONO%: 9.2 % (ref 0.0–14.0)
NEUT#: 5 10*3/uL (ref 1.5–6.5)
NEUT%: 61.8 % (ref 38.4–76.8)
Platelets: 230 10*3/uL (ref 145–400)
RBC: 4.18 10*6/uL (ref 3.70–5.45)
RDW: 12.5 % (ref 11.2–14.5)
WBC: 8.1 10*3/uL (ref 3.9–10.3)
lymph#: 2 10*3/uL (ref 0.9–3.3)

## 2015-04-07 LAB — COMPREHENSIVE METABOLIC PANEL (CC13)
ALBUMIN: 3.4 g/dL — AB (ref 3.5–5.0)
ALT: 18 U/L (ref 0–55)
AST: 21 U/L (ref 5–34)
Alkaline Phosphatase: 79 U/L (ref 40–150)
Anion Gap: 9 mEq/L (ref 3–11)
BUN: 14.6 mg/dL (ref 7.0–26.0)
CALCIUM: 9.3 mg/dL (ref 8.4–10.4)
CO2: 23 mEq/L (ref 22–29)
Chloride: 107 mEq/L (ref 98–109)
Creatinine: 0.9 mg/dL (ref 0.6–1.1)
EGFR: 64 mL/min/{1.73_m2} — AB (ref >90)
GLUCOSE: 84 mg/dL (ref 70–140)
POTASSIUM: 4.3 meq/L (ref 3.5–5.1)
SODIUM: 139 meq/L (ref 136–145)
Total Bilirubin: 0.38 mg/dL (ref 0.20–1.20)
Total Protein: 8 g/dL (ref 6.4–8.3)

## 2015-04-07 NOTE — Telephone Encounter (Signed)
Gave avs report and appointments for December.

## 2015-04-07 NOTE — Progress Notes (Signed)
[place] Telephone:(336) [phone]   Fax:(336) [phone]  OFFICE PROGRESS NOTE  Default, Provider, MD No address on file  DIAGNOSIS: Stage IV (T1b, N3, M1b) non-small cell lung cancer, adenocarcinoma with positive EGFR mutation in exon 21 (L858R) and recently found to have T790M resistant mutation, presented with left upper lobe lung mass in addition to left hilar and bilateral mediastinal lymphadenopathy as well as liver metastasis diagnosed in May of 2015.  PRIOR THERAPY:  1) Tarceva 150 mg by mouth daily. Started 11/25/2013. Status post 5 months of treatment discontinued today secondary to intolerance with persistent paronychia. 2) Tarceva 100 mg by mouth daily. Started 06/03/2014. Status post 8 months of treatment.   CURRENT THERAPY: Tagrisso 80 mg by mouth daily. First dose started 03/04/2015.   INTERVAL HISTORY: Debra Hodges 64 y.o. female returns to the clinic today for followup visit. She reports feeling well since last seen at the clinic. She was started on treatment with Tagrisso 80 mg by mouth daily on 03/04/2015 and has been tolerating her treatment well with no significant adverse effects. She denied having any significant skin rash or diarrhea. She is recovering from a cold. She denied having any significant chest pain, shortness of breath, cough or hemoptysis. She denied having any significant weight loss or night sweats. She has no nausea or vomiting. Her lower extremity edema improved and she can now wear shoes without any problems. She is here for a follow up visit   MEDICAL HISTORY: Past Medical History  Diagnosis Date  . High blood pressure   . Non-small cell carcinoma of lung, stage 4 ([place]) dx'd 10/2013    ALLERGIES:  is allergic to penicillins.  MEDICATIONS:  Current Outpatient Prescriptions  Medication Sig Dispense Refill  . acetaminophen (TYLENOL) 325 MG tablet Take 650 mg by mouth every 6 (six) hours as needed.    . calcium carbonate  (OS-CAL) 600 MG TABS tablet Take 600 mg by mouth daily.     Marland Kitchen CLIMARA 0.1 MG/24HR patch 1 patch once a week.    . clindamycin (CLEOCIN T) 1 % lotion APPLY TO AFFECTED AREA(S) TWICE DAILY. (Patient not taking: Reported on 03/17/2015) 60 mL 0  . levothyroxine (SYNTHROID, LEVOTHROID) 25 MCG tablet Take 25 mcg by mouth daily before breakfast.    . loperamide (IMODIUM) 2 MG capsule Take by mouth as needed for diarrhea or loose stools.    . metroNIDAZOLE (METROCREAM) 0.75 % cream Apply 1 application topically daily.    . Multiple Vitamins-Calcium (ONE-A-DAY WOMENS PO) Take 1 tablet by mouth daily.    . Osimertinib Mesylate (TAGRISSO PO) Take 80 mg by mouth daily.  2  . potassium chloride SA (K-DUR,KLOR-CON) 20 MEQ tablet 1 tablet 2 (two) times daily.    . silver nitrate applicators 34-74 % applicator Apply topically 3 (three) times a week. (Patient not taking: Reported on 03/17/2015) 100 each 0  . spironolactone (ALDACTONE) 25 MG tablet 1 tablet daily.    . vitamin C (ASCORBIC ACID) 500 MG tablet Take 500 mg by mouth daily.     No current facility-administered medications for this visit.    SURGICAL HISTORY:  Past Surgical History  Procedure Laterality Date  . Abdominal hysterectomy    . Breast biopsy      Right breast  . Video bronchoscopy Bilateral 10/27/2013    Procedure: VIDEO BRONCHOSCOPY WITH FLUORO;  Surgeon: Collene Gobble, MD;  Location: [facility];  Service: Cardiopulmonary;  Laterality: Bilateral;  REVIEW OF SYSTEMS:  A comprehensive review of systems was negative.   PHYSICAL EXAMINATION: General appearance: alert, cooperative and no distress Head: Normocephalic, without obvious abnormality, atraumatic Neck: no adenopathy, no JVD, supple, symmetrical, trachea midline and thyroid not enlarged, symmetric, no tenderness/mass/nodules Lymph nodes: Cervical, supraclavicular, and axillary nodes normal. Resp: clear to auscultation bilaterally Back: symmetric, no curvature. ROM normal.  No CVA tenderness. Cardio: regular rate and rhythm, S1, S2 normal, no murmur, click, rub or gallop GI: soft, non-tender; bowel sounds normal; no masses,  no organomegaly Extremities: extremities normal, atraumatic, no cyanosis or edema and With paronychia involving the left big toe. Neurologic: Alert and oriented X 3, normal strength and tone. Normal symmetric reflexes. Normal coordination and gait   ECOG PERFORMANCE STATUS: 1 - Symptomatic but completely ambulatory  Blood pressure 143/66, pulse 85, temperature 97.6 F (36.4 C), temperature source Oral, resp. rate 18, height '5\' 6"'  (1.676 m), weight 212 lb 11.2 oz (96.48 kg), SpO2 99 %. CBC Latest Ref Rng 04/07/2015 03/17/2015 02/25/2015  WBC 3.9 - 10.3 10e3/uL 8.1 8.5 10.1  Hemoglobin 11.6 - 15.9 g/dL 12.2 12.8 12.7  Hematocrit 34.8 - 46.6 % 36.7 38.3 38.5  Platelets 145 - 400 10e3/uL 230 211 294   CMP Latest Ref Rng 04/07/2015 03/17/2015 02/25/2015  Glucose 70 - 140 mg/dl 84 85 90  BUN 7.0 - 26.0 mg/dL 14.6 13.2 15.6  Creatinine 0.6 - 1.1 mg/dL 0.9 0.9 0.8  Sodium 136 - 145 mEq/L 139 140 140  Potassium 3.5 - 5.1 mEq/L 4.3 4.4 4.0  CO2 22 - 29 mEq/L '23 26 26  ' Calcium 8.4 - 10.4 mg/dL 9.3 9.7 9.0  Total Protein 6.4 - 8.3 g/dL 8.0 7.8 7.3  Total Bilirubin 0.20 - 1.20 mg/dL 0.38 0.41 0.47  Alkaline Phos 40 - 150 U/L 79 93 104  AST 5 - 34 U/L '21 20 27  ' ALT 0 - 55 U/L '18 17 24     ' RADIOGRAPHIC STUDIES: No results found.   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 She was treated with Tarceva 150 mg by mouth daily status post 5 months of treatment and tolerating it fairly well except for the skin rash and occasional diarrhea as well as paronychia of the toes.  She completed treatment with Tarceva 100 mg by mouth daily for 8 months. This was  discontinued secondary to disease progression. The recent molecular studies showed evidence for T790M resistant mutation. The patient was started on treatment with Tagrisso 80 mg by mouth daily on 03/04/2015 and has been tolerating her treatment fairly well with no significant adverse effects. She will return in 1 month for a follow up visit. Will schedule follow up staging CTs in 2 months to monitor response to treatment She was advised to call immediately if she has any concerning symptoms in the interval. The patient voices understanding of current disease status and treatment options and is in agreement with the current care plan.  All questions were answered. The patient knows to call the clinic with any problems, questions or concerns. We can certainly see the patient much sooner if necessary.    ADDENDUM: Hematology/Oncology Attending: I had a face to face encounter with the patient today. I recommended her care plan. This is a very pleasant 64 years old white female with a stage IV non-small  cell lung cancer, adenocarcinoma with positive EGFR mutation as well as resistant T790M mutation. She is currently on treatment with Tagrisso 80 mg by mouth daily status post 4 weeks of treatment. She has been tolerating her treatment fairly well with no significant adverse effects. She has significant improvement in the paronychia of the toes.  I recommended for the patient to continue her treatment with Tagrisso as a scheduled. She will come back for follow-up visit in one month for reevaluation after repeating CBC and comprehensive metabolic panel. The patient was advised to call immediately if she has any concerning symptoms in the interval.  Disclaimer: This note was dictated with voice recognition software. Similar sounding words can inadvertently be transcribed and may be missed upon review.  Eilleen Kempf., MD 04/07/2015

## 2015-05-10 ENCOUNTER — Encounter: Payer: Self-pay | Admitting: Internal Medicine

## 2015-05-10 ENCOUNTER — Other Ambulatory Visit (HOSPITAL_BASED_OUTPATIENT_CLINIC_OR_DEPARTMENT_OTHER): Payer: Medicare Other

## 2015-05-10 ENCOUNTER — Ambulatory Visit (HOSPITAL_BASED_OUTPATIENT_CLINIC_OR_DEPARTMENT_OTHER): Payer: Medicare Other | Admitting: Internal Medicine

## 2015-05-10 ENCOUNTER — Telehealth: Payer: Self-pay | Admitting: Internal Medicine

## 2015-05-10 VITALS — BP 111/48 | HR 76 | Temp 98.0°F | Resp 18 | Ht 66.0 in | Wt 211.3 lb

## 2015-05-10 DIAGNOSIS — C3490 Malignant neoplasm of unspecified part of unspecified bronchus or lung: Secondary | ICD-10-CM

## 2015-05-10 DIAGNOSIS — C3412 Malignant neoplasm of upper lobe, left bronchus or lung: Secondary | ICD-10-CM

## 2015-05-10 DIAGNOSIS — Z5111 Encounter for antineoplastic chemotherapy: Secondary | ICD-10-CM

## 2015-05-10 DIAGNOSIS — R5383 Other fatigue: Secondary | ICD-10-CM

## 2015-05-10 LAB — CBC WITH DIFFERENTIAL/PLATELET
BASO%: 0.2 % (ref 0.0–2.0)
Basophils Absolute: 0 10*3/uL (ref 0.0–0.1)
EOS ABS: 0.2 10*3/uL (ref 0.0–0.5)
EOS%: 2.7 % (ref 0.0–7.0)
HCT: 37 % (ref 34.8–46.6)
HEMOGLOBIN: 12.1 g/dL (ref 11.6–15.9)
LYMPH%: 24.4 % (ref 14.0–49.7)
MCH: 28.4 pg (ref 25.1–34.0)
MCHC: 32.7 g/dL (ref 31.5–36.0)
MCV: 87 fL (ref 79.5–101.0)
MONO#: 0.7 10*3/uL (ref 0.1–0.9)
MONO%: 9.3 % (ref 0.0–14.0)
NEUT#: 4.8 10*3/uL (ref 1.5–6.5)
NEUT%: 63.4 % (ref 38.4–76.8)
Platelets: 198 10*3/uL (ref 145–400)
RBC: 4.26 10*6/uL (ref 3.70–5.45)
RDW: 13.2 % (ref 11.2–14.5)
WBC: 7.6 10*3/uL (ref 3.9–10.3)
lymph#: 1.9 10*3/uL (ref 0.9–3.3)

## 2015-05-10 LAB — COMPREHENSIVE METABOLIC PANEL
ALT: 16 U/L (ref 0–55)
ANION GAP: 8 meq/L (ref 3–11)
AST: 26 U/L (ref 5–34)
Albumin: 3.8 g/dL (ref 3.5–5.0)
Alkaline Phosphatase: 59 U/L (ref 40–150)
BILIRUBIN TOTAL: 0.48 mg/dL (ref 0.20–1.20)
BUN: 14.8 mg/dL (ref 7.0–26.0)
CALCIUM: 9.2 mg/dL (ref 8.4–10.4)
CO2: 23 meq/L (ref 22–29)
CREATININE: 1 mg/dL (ref 0.6–1.1)
Chloride: 108 mEq/L (ref 98–109)
EGFR: 59 mL/min/{1.73_m2} — ABNORMAL LOW (ref >90)
Glucose: 90 mg/dl (ref 70–140)
Potassium: 4.6 mEq/L (ref 3.5–5.1)
Sodium: 139 mEq/L (ref 136–145)
TOTAL PROTEIN: 7.9 g/dL (ref 6.4–8.3)

## 2015-05-10 NOTE — Telephone Encounter (Signed)
Gave and printed appt sched and avs for pt for Jan ....gave barium

## 2015-05-10 NOTE — Progress Notes (Signed)
[place] [phone]   Fax:(336) [phone]  OFFICE PROGRESS NOTE  Default, Provider, MD No address on file  DIAGNOSIS: Stage IV (T1b, N3, M1b) non-small cell lung cancer, adenocarcinoma with positive EGFR mutation in exon 21 (L858R) and recently found to have T790M resistant mutation, presented with left upper lobe lung mass in addition to left hilar and bilateral mediastinal lymphadenopathy as well as liver metastasis diagnosed in May of 2015.  PRIOR THERAPY:  1) Tarceva 150 mg by mouth daily. Started 11/25/2013. Status post 5 months of treatment discontinued today secondary to intolerance with persistent paronychia. 2) Tarceva 100 mg by mouth daily. Started 06/03/2014. Status post 8 months of treatment.   CURRENT THERAPY: Tagrisso 80 mg by mouth daily. First dose started 03/04/2015. Status post 2 months of treatment.  INTERVAL HISTORY: Debra Hodges 64 y.o. female returns to the clinic today for followup visit. The patient is feeling fine today with no specific complaints. She was started on treatment with Tagrisso 80 mg by mouth daily on 03/04/2015 and has been tolerating her treatment well with no significant adverse effects. She denied having any significant skin rash or diarrhea. She denied having any significant chest pain, shortness of breath, cough or hemoptysis. She denied having any significant weight loss or night sweats. She has no nausea or vomiting. She had repeat CBC and compliance metabolic panel performed earlier today and she is here for evaluation and discussion of her lab results.  MEDICAL HISTORY: Past Medical History  Diagnosis Date  . High blood pressure   . Non-small cell carcinoma of lung, stage 4 ([place]) dx'd 10/2013    ALLERGIES:  is allergic to penicillins.  MEDICATIONS:  Current Outpatient Prescriptions  Medication Sig Dispense Refill  . acetaminophen (TYLENOL) 325 MG tablet Take 650 mg by mouth every 6 (six) hours as  needed.    . calcium carbonate (OS-CAL) 600 MG TABS tablet Take 600 mg by mouth daily.     Marland Kitchen CLIMARA 0.1 MG/24HR patch 1 patch once a week.    . clindamycin (CLEOCIN T) 1 % lotion APPLY TO AFFECTED AREA(S) TWICE DAILY. (Patient not taking: Reported on 03/17/2015) 60 mL 0  . levothyroxine (SYNTHROID, LEVOTHROID) 25 MCG tablet Take 25 mcg by mouth daily before breakfast.    . loperamide (IMODIUM) 2 MG capsule Take by mouth as needed for diarrhea or loose stools.    . metroNIDAZOLE (METROCREAM) 0.75 % cream Apply 1 application topically daily.    . Multiple Vitamins-Calcium (ONE-A-DAY WOMENS PO) Take 1 tablet by mouth daily.    . Osimertinib Mesylate (TAGRISSO PO) Take 80 mg by mouth daily.  2  . potassium chloride SA (K-DUR,KLOR-CON) 20 MEQ tablet 1 tablet 2 (two) times daily.    . silver nitrate applicators 78-93 % applicator Apply topically 3 (three) times a week. (Patient not taking: Reported on 03/17/2015) 100 each 0  . spironolactone (ALDACTONE) 25 MG tablet 1 tablet daily.    . vitamin C (ASCORBIC ACID) 500 MG tablet Take 500 mg by mouth daily.     No current facility-administered medications for this visit.    SURGICAL HISTORY:  Past Surgical History  Procedure Laterality Date  . Abdominal hysterectomy    . Breast biopsy      Right breast  . Video bronchoscopy Bilateral 10/27/2013    Procedure: VIDEO BRONCHOSCOPY WITH FLUORO;  Surgeon: Collene Gobble, MD;  Location: [facility];  Service: Cardiopulmonary;  Laterality: Bilateral;    REVIEW  OF SYSTEMS:  A comprehensive review of systems was negative except for: Constitutional: positive for fatigue   PHYSICAL EXAMINATION: General appearance: alert, cooperative and no distress Head: Normocephalic, without obvious abnormality, atraumatic Neck: no adenopathy, no JVD, supple, symmetrical, trachea midline and thyroid not enlarged, symmetric, no tenderness/mass/nodules Lymph nodes: Cervical, supraclavicular, and axillary nodes  normal. Resp: clear to auscultation bilaterally Back: symmetric, no curvature. ROM normal. No CVA tenderness. Cardio: regular rate and rhythm, S1, S2 normal, no murmur, click, rub or gallop GI: soft, non-tender; bowel sounds normal; no masses,  no organomegaly Extremities: extremities normal, atraumatic, no cyanosis or edema and With paronychia involving the left big toe. Neurologic: Alert and oriented X 3, normal strength and tone. Normal symmetric reflexes. Normal coordination and gait   ECOG PERFORMANCE STATUS: 1 - Symptomatic but completely ambulatory  Blood pressure 111/48, pulse 76, temperature 98 F (36.7 C), temperature source Oral, resp. rate 18, height $RemoveBe'5\' 6"'aqVFXumEE$  (1.676 m), weight 211 lb 4.8 oz (95.845 kg), SpO2 100 %.  LABORATORY DATA: Lab Results  Component Value Date   WBC 7.6 05/10/2015   HGB 12.1 05/10/2015   HCT 37.0 05/10/2015   MCV 87.0 05/10/2015   PLT 198 05/10/2015      Chemistry      Component Value Date/Time   NA 139 04/07/2015 0813   K 4.3 04/07/2015 0813   CO2 23 04/07/2015 0813   BUN 14.6 04/07/2015 0813   CREATININE 0.9 04/07/2015 0813   CREATININE 0.71 11/06/2013 1813      Component Value Date/Time   CALCIUM 9.3 04/07/2015 0813   ALKPHOS 79 04/07/2015 0813   AST 21 04/07/2015 0813   ALT 18 04/07/2015 0813   BILITOT 0.38 04/07/2015 0813       RADIOGRAPHIC STUDIES: No results found. 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 She was treated with Tarceva 150 mg by mouth daily status post 5 months of treatment and tolerating it fairly well except for the skin rash and occasional diarrhea as well as paronychia of the toes.  She completed treatment with Tarceva 100 mg by mouth daily for the last 8 months. This was discontinued secondary to disease  progression. The recent molecular studies showed evidence for T790M resistant mutation. The patient was started on treatment with Tagrisso 80 mg by mouth daily on 03/04/2015 and has been tolerating her treatment fairly well with no significant adverse effects except for mild fatigue. I recommended for the patient to continue her current treatment with Tagrisso. I will see her back for follow-up visit in one month's for reevaluation after repeating CT scan of the chest, abdomen and pelvis for restaging of her disease. She was advised to call immediately if she has any concerning symptoms in the interval. The patient voices understanding of current disease status and treatment options and is in agreement with the current care plan.  All questions were answered. The patient knows to call the clinic with any problems, questions or concerns. We can certainly see the patient much sooner if necessary.  Disclaimer: This note was dictated with voice recognition software. Similar sounding words can inadvertently be transcribed and may not be corrected upon review.

## 2015-05-28 ENCOUNTER — Other Ambulatory Visit: Payer: Self-pay | Admitting: *Deleted

## 2015-05-28 DIAGNOSIS — C3412 Malignant neoplasm of upper lobe, left bronchus or lung: Secondary | ICD-10-CM

## 2015-05-28 MED ORDER — OSIMERTINIB MESYLATE 80 MG PO TABS: 80.0000 mg | ORAL_TABLET | Freq: Every day | ORAL | Status: DC

## 2015-05-28 NOTE — Telephone Encounter (Signed)
Call from pt regarding Tagrisso refill. Called [facility] to refill Rx per MD's last progress note. [facility] advised they will call pt with refill information and pickup date as this will need to be ordered.

## 2015-06-07 ENCOUNTER — Other Ambulatory Visit: Payer: Medicare Other

## 2015-06-09 ENCOUNTER — Ambulatory Visit (HOSPITAL_COMMUNITY)
Admission: RE | Admit: 2015-06-09 | Discharge: 2015-06-09 | Disposition: A | Discharge status: Home / Self Care | Payer: Managed Care, Other (non HMO) | Source: Ambulatory Visit | Attending: Internal Medicine | Admitting: Internal Medicine

## 2015-06-09 ENCOUNTER — Ambulatory Visit (HOSPITAL_BASED_OUTPATIENT_CLINIC_OR_DEPARTMENT_OTHER): Payer: Managed Care, Other (non HMO)

## 2015-06-09 DIAGNOSIS — D1771 Benign lipomatous neoplasm of kidney: Secondary | ICD-10-CM | POA: Diagnosis not present

## 2015-06-09 DIAGNOSIS — C3412 Malignant neoplasm of upper lobe, left bronchus or lung: Secondary | ICD-10-CM | POA: Diagnosis not present

## 2015-06-09 DIAGNOSIS — Z9221 Personal history of antineoplastic chemotherapy: Secondary | ICD-10-CM | POA: Diagnosis present

## 2015-06-09 DIAGNOSIS — N200 Calculus of kidney: Secondary | ICD-10-CM | POA: Diagnosis not present

## 2015-06-09 DIAGNOSIS — E041 Nontoxic single thyroid nodule: Secondary | ICD-10-CM | POA: Insufficient documentation

## 2015-06-09 DIAGNOSIS — K769 Liver disease, unspecified: Secondary | ICD-10-CM | POA: Diagnosis not present

## 2015-06-09 DIAGNOSIS — Z5111 Encounter for antineoplastic chemotherapy: Secondary | ICD-10-CM

## 2015-06-09 DIAGNOSIS — N270 Small kidney, unilateral: Secondary | ICD-10-CM | POA: Diagnosis not present

## 2015-06-09 DIAGNOSIS — K449 Diaphragmatic hernia without obstruction or gangrene: Secondary | ICD-10-CM | POA: Insufficient documentation

## 2015-06-09 DIAGNOSIS — K760 Fatty (change of) liver, not elsewhere classified: Secondary | ICD-10-CM | POA: Insufficient documentation

## 2015-06-09 LAB — COMPREHENSIVE METABOLIC PANEL
ALT: 19 U/L (ref 0–55)
AST: 24 U/L (ref 5–34)
Albumin: 4.2 g/dL (ref 3.5–5.0)
Alkaline Phosphatase: 67 U/L (ref 40–150)
Anion Gap: 9 mEq/L (ref 3–11)
BILIRUBIN TOTAL: 0.54 mg/dL (ref 0.20–1.20)
BUN: 15 mg/dL (ref 7.0–26.0)
CALCIUM: 9.3 mg/dL (ref 8.4–10.4)
CHLORIDE: 106 meq/L (ref 98–109)
CO2: 24 meq/L (ref 22–29)
Creatinine: 0.9 mg/dL (ref 0.6–1.1)
EGFR: 67 mL/min/{1.73_m2} — ABNORMAL LOW (ref >90)
Glucose: 90 mg/dl (ref 70–140)
Potassium: 4 mEq/L (ref 3.5–5.1)
Sodium: 139 mEq/L (ref 136–145)
Total Protein: 8.3 g/dL (ref 6.4–8.3)

## 2015-06-09 LAB — CBC WITH DIFFERENTIAL/PLATELET
BASO%: 0.1 % (ref 0.0–2.0)
Basophils Absolute: 0 10*3/uL (ref 0.0–0.1)
EOS%: 1.6 % (ref 0.0–7.0)
Eosinophils Absolute: 0.2 10*3/uL (ref 0.0–0.5)
HEMATOCRIT: 38.9 % (ref 34.8–46.6)
HGB: 13 g/dL (ref 11.6–15.9)
LYMPH#: 2.6 10*3/uL (ref 0.9–3.3)
LYMPH%: 26.9 % (ref 14.0–49.7)
MCH: 28.9 pg (ref 25.1–34.0)
MCHC: 33.4 g/dL (ref 31.5–36.0)
MCV: 86.4 fL (ref 79.5–101.0)
MONO#: 0.7 10*3/uL (ref 0.1–0.9)
MONO%: 7.4 % (ref 0.0–14.0)
NEUT#: 6.1 10*3/uL (ref 1.5–6.5)
NEUT%: 64 % (ref 38.4–76.8)
PLATELETS: 195 10*3/uL (ref 145–400)
RBC: 4.5 10*6/uL (ref 3.70–5.45)
RDW: 13.9 % (ref 11.2–14.5)
WBC: 9.6 10*3/uL (ref 3.9–10.3)

## 2015-06-09 MED ORDER — IOHEXOL 300 MG/ML  SOLN
100.0000 mL | Freq: Once | INTRAMUSCULAR | Status: AC | PRN
  Administered 2015-06-09: 100 mL via INTRAVENOUS

## 2015-06-14 MED FILL — CLIMARA 0.1 MG/DAY PATCH: 0.1 | 29 days supply | Qty: 4 | Fill #5

## 2015-06-15 ENCOUNTER — Ambulatory Visit (HOSPITAL_BASED_OUTPATIENT_CLINIC_OR_DEPARTMENT_OTHER): Payer: Medicare Other | Admitting: Internal Medicine

## 2015-06-15 ENCOUNTER — Telehealth: Payer: Self-pay | Admitting: Internal Medicine

## 2015-06-15 ENCOUNTER — Encounter: Payer: Self-pay | Admitting: Internal Medicine

## 2015-06-15 VITALS — BP 126/49 | HR 80 | Temp 98.0°F | Resp 18 | Ht 66.0 in | Wt 215.0 lb

## 2015-06-15 DIAGNOSIS — C3490 Malignant neoplasm of unspecified part of unspecified bronchus or lung: Secondary | ICD-10-CM | POA: Diagnosis not present

## 2015-06-15 DIAGNOSIS — C787 Secondary malignant neoplasm of liver and intrahepatic bile duct: Secondary | ICD-10-CM | POA: Diagnosis not present

## 2015-06-15 DIAGNOSIS — C3412 Malignant neoplasm of upper lobe, left bronchus or lung: Secondary | ICD-10-CM

## 2015-06-15 DIAGNOSIS — C3492 Malignant neoplasm of unspecified part of left bronchus or lung: Secondary | ICD-10-CM

## 2015-06-15 DIAGNOSIS — Z5111 Encounter for antineoplastic chemotherapy: Secondary | ICD-10-CM

## 2015-06-15 NOTE — Progress Notes (Signed)
[place] Telephone:([phone]) [phone]   [phone]) [phone]  OFFICE PROGRESS NOTE  Default, Provider, MD No address on file  DIAGNOSIS: Stage IV (T1b, N3, M1b) non-small cell lung cancer, adenocarcinoma with positive EGFR mutation in exon 21 (L858R) and recently found to have T790M resistant mutation, presented with left upper lobe lung mass in addition to left hilar and bilateral mediastinal lymphadenopathy as well as liver metastasis diagnosed in May of 2015.  PRIOR THERAPY:  1) Tarceva 150 mg by mouth daily. Started 11/25/2013. Status post 5 months of treatment discontinued today secondary to intolerance with persistent paronychia. 2) Tarceva 100 mg by mouth daily. Started 06/03/2014. Status post 8 months of treatment.   CURRENT THERAPY: Tagrisso 80 mg by mouth daily. First dose started 03/04/2015. Status post 3 months of treatment.  INTERVAL HISTORY: Debra Hodges 65 y.o. female returns to the clinic today for followup visit accompanied by her husband. The patient is feeling fine today with no specific complaints. She was started on treatment with Tagrisso 80 mg by mouth daily on 03/04/2015 and has been tolerating her treatment well with no significant adverse effects. She denied having any significant skin rash or diarrhea. The Paronychia in her toes had completely resolved. She denied having any significant chest pain, shortness of breath, cough or hemoptysis. She denied having any significant weight loss or night sweats. She has no nausea or vomiting. She had repeat CBC and comprehensive metabolic panel as well as CT scan of the chest, abdomen and pelvis performed recently and she is here for evaluation and discussion of her lab results.  MEDICAL HISTORY: Past Medical History  Diagnosis Date  . High blood pressure   . Non-small cell carcinoma of lung, stage 4 ([place]) dx'd 10/2013    ALLERGIES:  is allergic to penicillins.  MEDICATIONS:  Current Outpatient  Prescriptions  Medication Sig Dispense Refill  . calcium carbonate (OS-CAL) 600 MG TABS tablet Take 600 mg by mouth daily.     Marland Kitchen CLIMARA 0.1 MG/24HR patch 1 patch once a week.    . levothyroxine (SYNTHROID, LEVOTHROID) 25 MCG tablet Take 25 mcg by mouth daily before breakfast.    . metroNIDAZOLE (METROCREAM) 0.75 % cream Apply 1 application topically daily.    . Multiple Vitamins-Calcium (ONE-A-DAY WOMENS PO) Take 1 tablet by mouth daily.    Marland Kitchen osimertinib mesylate (TAGRISSO) 80 MG tablet Take 1 tablet (80 mg total) by mouth daily. 30 tablet 2  . potassium chloride SA (K-DUR,KLOR-CON) 20 MEQ tablet 1 tablet 2 (two) times daily.    Marland Kitchen spironolactone (ALDACTONE) 25 MG tablet 1 tablet daily.    . vitamin C (ASCORBIC ACID) 500 MG tablet Take 500 mg by mouth daily.    Marland Kitchen acetaminophen (TYLENOL) 325 MG tablet Take 650 mg by mouth every 6 (six) hours as needed. Reported on 06/15/2015    . loperamide (IMODIUM) 2 MG capsule Take by mouth as needed for diarrhea or loose stools. Reported on 06/15/2015     No current facility-administered medications for this visit.    SURGICAL HISTORY:  Past Surgical History  Procedure Laterality Date  . Abdominal hysterectomy    . Breast biopsy      Right breast  . Video bronchoscopy Bilateral 10/27/2013    Procedure: VIDEO BRONCHOSCOPY WITH FLUORO;  Surgeon: Collene Gobble, MD;  Location: [facility];  Service: Cardiopulmonary;  Laterality: Bilateral;    REVIEW OF SYSTEMS:  Constitutional: negative Eyes: negative Ears, nose, mouth, throat, and face:  negative Respiratory: negative Cardiovascular: negative Gastrointestinal: negative Genitourinary:negative Integument/breast: negative Hematologic/lymphatic: negative Musculoskeletal:negative Neurological: negative Behavioral/Psych: negative Endocrine: negative Allergic/Immunologic: negative   PHYSICAL EXAMINATION: General appearance: alert, cooperative and no distress Head: Normocephalic, without obvious  abnormality, atraumatic Neck: no adenopathy, no JVD, supple, symmetrical, trachea midline and thyroid not enlarged, symmetric, no tenderness/mass/nodules Lymph nodes: Cervical, supraclavicular, and axillary nodes normal. Resp: clear to auscultation bilaterally Back: symmetric, no curvature. ROM normal. No CVA tenderness. Cardio: regular rate and rhythm, S1, S2 normal, no murmur, click, rub or gallop GI: soft, non-tender; bowel sounds normal; no masses,  no organomegaly Extremities: extremities normal, atraumatic, no cyanosis or edema and With paronychia involving the left big toe. Neurologic: Alert and oriented X 3, normal strength and tone. Normal symmetric reflexes. Normal coordination and gait   ECOG PERFORMANCE STATUS: 1 - Symptomatic but completely ambulatory  Blood pressure 126/49, pulse 80, temperature 98 F (36.7 C), temperature source Oral, resp. rate 18, height '5\' 6"'$  (1.676 m), weight 215 lb (97.523 kg), SpO2 100 %.  LABORATORY DATA: Lab Results  Component Value Date   WBC 9.6 06/09/2015   HGB 13.0 06/09/2015   HCT 38.9 06/09/2015   MCV 86.4 06/09/2015   PLT 195 06/09/2015      Chemistry      Component Value Date/Time   NA 139 06/09/2015 0920   K 4.0 06/09/2015 0920   CO2 24 06/09/2015 0920   BUN 15.0 06/09/2015 0920   CREATININE 0.9 06/09/2015 0920   CREATININE 0.71 11/06/2013 1813      Component Value Date/Time   CALCIUM 9.3 06/09/2015 0920   ALKPHOS 67 06/09/2015 0920   AST 24 06/09/2015 0920   ALT 19 06/09/2015 0920   BILITOT 0.54 06/09/2015 0920       RADIOGRAPHIC STUDIES: Ct Chest W Contrast  06/09/2015  CLINICAL DATA:  Left lung cancer diagnosed in June 2015 with ongoing oral chemotherapy. EXAM: CT CHEST, ABDOMEN, AND PELVIS WITH CONTRAST TECHNIQUE: Multidetector CT imaging of the chest, abdomen and pelvis was performed following the standard protocol during bolus administration of intravenous contrast. CONTRAST:  143m OMNIPAQUE IOHEXOL 300 MG/ML   SOLN COMPARISON:  12/24/2014 FINDINGS: CT CHEST FINDINGS Mediastinum/Nodes: Hypodense right thyroid nodule, 1.3 by 0 0.9 cm. Several other small thyroid nodules with punctate calcifications. Mild atherosclerotic calcification of the aortic arch. Small type 1 hiatal hernia. A right hilar lymph node is upper normal in size at 0.9 cm. Small bilateral axillary lymph nodes observed. Lungs/Pleura: Lingular lesion difficult to measure due to orientation, probably most reproducibly measured in short axis, currently 2.1 cm in short axis, not changed. There is likely some associated postobstructive atelectasis. The general configuration is not appreciably changed. Musculoskeletal: Thoracic spondylosis. CT ABDOMEN PELVIS FINDINGS Hepatobiliary: Mild diffuse hepatic steatosis. Reduced definition of the segment 4A lesion, currently 2.1 by 1.4 cm on image 41 series 2, previously 2.3 by 1.7 cm. The lesion along the left side of the lateral segment left hepatic lobe is not well seen, perhaps measuring 5 mm today on image 43 series 2, previously 1.3 by 1.2 cm. No new hepatic lesions. Gallbladder unremarkable. Pancreas: Unremarkable Spleen: Stable 8 mm in diameter hypodense lesion in the parenchyma along the splenic hilum inferiorly, image 50 series 2, no change. Adrenals/Urinary Tract: 3 by 2 mm left kidney lower pole nonobstructive calculus. Adrenal glands normal. 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 Stomach/Bowel: Unremarkable Vascular/Lymphatic: Aortoiliac atherosclerotic vascular disease. Reproductive: Uterus absent. Stable appearance of the vaginal cuff with slight  prominence of the soft tissues along the right side of the vaginal cuff, transverse measurement 2.1 cm, previously 2.2 cm. I am uncertain whether there is residual ovarian tissue in this area. Other: No supplemental non-categorized findings. Musculoskeletal: Degenerative subcortical cysts or geodes along the left anterior acetabular roof.  There is grade 1 anterolisthesis check grade 1 degenerative anterolisthesis at L4-5 associated with bilateral facet arthropathy. IMPRESSION: 1. Liver lesions are reduced in size and conspicuity suggesting response to therapy. 2. The lingular opacity is unchanged, and may represent treated tumor and/or radiation fibrosis. 3. Other imaging findings of potential clinical significance: Stable hypodense thyroid nodules. Small type 1 hiatal hernia. Diffuse hepatic steatosis. Stable probable cyst in the spleen. 3 by 2 mm left kidney lower pole nonobstructive calculus. Small left kidney lower pole angiomyolipoma. Stable fullness of the soft tissues along the right vaginal cuff, unchanged from prior. Electronically Signed   By: Van Clines M.D.   On: 06/09/2015 11:49   Ct Abdomen Pelvis W Contrast  06/09/2015  CLINICAL DATA:  Left lung cancer diagnosed in June 2015 with ongoing oral chemotherapy. EXAM: CT CHEST, ABDOMEN, AND PELVIS WITH CONTRAST TECHNIQUE: Multidetector CT imaging of the chest, abdomen and pelvis was performed following the standard protocol during bolus administration of intravenous contrast. CONTRAST:  187m OMNIPAQUE IOHEXOL 300 MG/ML  SOLN COMPARISON:  12/24/2014 FINDINGS: CT CHEST FINDINGS Mediastinum/Nodes: Hypodense right thyroid nodule, 1.3 by 0 0.9 cm. Several other small thyroid nodules with punctate calcifications. Mild atherosclerotic calcification of the aortic arch. Small type 1 hiatal hernia. A right hilar lymph node is upper normal in size at 0.9 cm. Small bilateral axillary lymph nodes observed. Lungs/Pleura: Lingular lesion difficult to measure due to orientation, probably most reproducibly measured in short axis, currently 2.1 cm in short axis, not changed. There is likely some associated postobstructive atelectasis. The general configuration is not appreciably changed. Musculoskeletal: Thoracic spondylosis. CT ABDOMEN PELVIS FINDINGS Hepatobiliary: Mild diffuse hepatic  steatosis. Reduced definition of the segment 4A lesion, currently 2.1 by 1.4 cm on image 41 series 2, previously 2.3 by 1.7 cm. The lesion along the left side of the lateral segment left hepatic lobe is not well seen, perhaps measuring 5 mm today on image 43 series 2, previously 1.3 by 1.2 cm. No new hepatic lesions. Gallbladder unremarkable. Pancreas: Unremarkable Spleen: Stable 8 mm in diameter hypodense lesion in the parenchyma along the splenic hilum inferiorly, image 50 series 2, no change. Adrenals/Urinary Tract: 3 by 2 mm left kidney lower pole nonobstructive calculus. Adrenal glands normal. 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 Stomach/Bowel: Unremarkable Vascular/Lymphatic: Aortoiliac atherosclerotic vascular disease. Reproductive: Uterus absent. Stable appearance of the vaginal cuff with slight prominence of the soft tissues along the right side of the vaginal cuff, transverse measurement 2.1 cm, previously 2.2 cm. I am uncertain whether there is residual ovarian tissue in this area. Other: No supplemental non-categorized findings. Musculoskeletal: Degenerative subcortical cysts or geodes along the left anterior acetabular roof. There is grade 1 anterolisthesis check grade 1 degenerative anterolisthesis at L4-5 associated with bilateral facet arthropathy. IMPRESSION: 1. Liver lesions are reduced in size and conspicuity suggesting response to therapy. 2. The lingular opacity is unchanged, and may represent treated tumor and/or radiation fibrosis. 3. Other imaging findings of potential clinical significance: Stable hypodense thyroid nodules. Small type 1 hiatal hernia. Diffuse hepatic steatosis. Stable probable cyst in the spleen. 3 by 2 mm left kidney lower pole nonobstructive calculus. Small left kidney lower pole angiomyolipoma. Stable fullness  of the soft tissues along the right vaginal cuff, unchanged from prior. Electronically Signed   By: Van Clines M.D.   On:  06/09/2015 11:49   ASSESSMENT AND PLAN: This is a very pleasant 65 years old white female recently diagnosed with stage IV non-small cell lung cancer, adenocarcinoma with positive EGFR mutation in exon 21 (L858R) and recently developed T790M resistant mutation. She was treated with Tarceva 150 mg by mouth daily status post 5 months of treatment and tolerating it fairly well except for the skin rash and occasional diarrhea as well as paronychia of the toes.  She completed treatment with Tarceva 100 mg by mouth daily for the last 8 months. This was discontinued secondary to disease progression. The recent molecular studies showed evidence for T790M resistant mutation. The patient was started on treatment with Tagrisso 80 mg by mouth daily on 03/04/2015 and has been tolerating her treatment fairly well with no significant adverse effects. The recent CT scan of the chest, abdomen and pelvis showed improvement of her liver lesions with no evidence of disease progression. I discussed the scan results with the patient and her husband. I recommended for the patient to continue her current treatment with Tagrisso. I will see her back for follow-up visit in one month for reevaluation with repeat blood work.  She was advised to call immediately if she has any concerning symptoms in the interval. The patient voices understanding of current disease status and treatment options and is in agreement with the current care plan.  All questions were answered. The patient knows to call the clinic with any problems, questions or concerns. We can certainly see the patient much sooner if necessary.  Disclaimer: This note was dictated with voice recognition software. Similar sounding words can inadvertently be transcribed and may not be corrected upon review.

## 2015-06-15 NOTE — Telephone Encounter (Signed)
Talked to patient here in office. Scheduled appt.       [facility]. °

## 2015-06-29 MED FILL — SPIRONOLACTONE 25 MG TABLET: 25 | 90 days supply | Qty: 90 | Fill #2

## 2015-06-29 MED FILL — *TAGRISSO 80MG TABLET: 80 | 30 days supply | Qty: 30 | Fill #1

## 2015-07-12 MED FILL — LEVOTHYROXINE 25 MCG TABLET: 25 | 90 days supply | Qty: 90 | Fill #2

## 2015-07-12 MED FILL — KLOR-CON M20 TABLET: 20 | 90 days supply | Qty: 180 | Fill #2

## 2015-07-12 MED FILL — CLIMARA 0.1 MG/DAY PATCH: 0.1 | 29 days supply | Qty: 4 | Fill #6

## 2015-07-19 ENCOUNTER — Telehealth: Payer: Self-pay | Admitting: Internal Medicine

## 2015-07-19 ENCOUNTER — Other Ambulatory Visit (HOSPITAL_BASED_OUTPATIENT_CLINIC_OR_DEPARTMENT_OTHER): Payer: Managed Care, Other (non HMO)

## 2015-07-19 ENCOUNTER — Ambulatory Visit (HOSPITAL_BASED_OUTPATIENT_CLINIC_OR_DEPARTMENT_OTHER): Payer: Medicare Other | Admitting: Internal Medicine

## 2015-07-19 ENCOUNTER — Encounter: Payer: Self-pay | Admitting: Internal Medicine

## 2015-07-19 VITALS — BP 137/59 | HR 82 | Temp 97.8°F | Resp 18 | Ht 66.0 in | Wt 214.0 lb

## 2015-07-19 DIAGNOSIS — C3412 Malignant neoplasm of upper lobe, left bronchus or lung: Secondary | ICD-10-CM

## 2015-07-19 DIAGNOSIS — Z5111 Encounter for antineoplastic chemotherapy: Secondary | ICD-10-CM

## 2015-07-19 DIAGNOSIS — C3492 Malignant neoplasm of unspecified part of left bronchus or lung: Secondary | ICD-10-CM

## 2015-07-19 DIAGNOSIS — C787 Secondary malignant neoplasm of liver and intrahepatic bile duct: Secondary | ICD-10-CM

## 2015-07-19 LAB — CBC WITH DIFFERENTIAL/PLATELET
BASO%: 0 % (ref 0.0–2.0)
Basophils Absolute: 0 10*3/uL (ref 0.0–0.1)
EOS%: 1.7 % (ref 0.0–7.0)
Eosinophils Absolute: 0.1 10*3/uL (ref 0.0–0.5)
HEMATOCRIT: 37.1 % (ref 34.8–46.6)
HGB: 12.3 g/dL (ref 11.6–15.9)
LYMPH#: 2.3 10*3/uL (ref 0.9–3.3)
LYMPH%: 29.5 % (ref 14.0–49.7)
MCH: 28.8 pg (ref 25.1–34.0)
MCHC: 33.2 g/dL (ref 31.5–36.0)
MCV: 86.9 fL (ref 79.5–101.0)
MONO#: 0.6 10*3/uL (ref 0.1–0.9)
MONO%: 7.1 % (ref 0.0–14.0)
NEUT%: 61.7 % (ref 38.4–76.8)
NEUTROS ABS: 4.8 10*3/uL (ref 1.5–6.5)
PLATELETS: 178 10*3/uL (ref 145–400)
RBC: 4.27 10*6/uL (ref 3.70–5.45)
RDW: 14.4 % (ref 11.2–14.5)
WBC: 7.8 10*3/uL (ref 3.9–10.3)

## 2015-07-19 LAB — COMPREHENSIVE METABOLIC PANEL
ALBUMIN: 3.7 g/dL (ref 3.5–5.0)
ALT: 19 U/L (ref 0–55)
ANION GAP: 9 meq/L (ref 3–11)
AST: 21 U/L (ref 5–34)
Alkaline Phosphatase: 71 U/L (ref 40–150)
BILIRUBIN TOTAL: 0.35 mg/dL (ref 0.20–1.20)
BUN: 18.3 mg/dL (ref 7.0–26.0)
CALCIUM: 8.7 mg/dL (ref 8.4–10.4)
CO2: 23 mEq/L (ref 22–29)
CREATININE: 0.9 mg/dL (ref 0.6–1.1)
Chloride: 108 mEq/L (ref 98–109)
EGFR: 70 mL/min/{1.73_m2} — ABNORMAL LOW (ref >90)
Glucose: 80 mg/dl (ref 70–140)
Potassium: 3.6 mEq/L (ref 3.5–5.1)
Sodium: 140 mEq/L (ref 136–145)
TOTAL PROTEIN: 7.7 g/dL (ref 6.4–8.3)

## 2015-07-19 NOTE — Telephone Encounter (Signed)
Pt confirmed labs/ov per 02/20 POF, gave pt AVS and Calendar... KJ

## 2015-07-19 NOTE — Progress Notes (Signed)
[place] Telephone:(336) [phone]   Fax:(336) [phone]  OFFICE PROGRESS NOTE  DIAGNOSIS: Stage IV (T1b, N3, M1b) non-small cell lung cancer, adenocarcinoma with positive EGFR mutation in exon 21 (L858R) and recently found to have T790M resistant mutation, presented with left upper lobe lung mass in addition to left hilar and bilateral mediastinal lymphadenopathy as well as liver metastasis diagnosed in May of 2015.  PRIOR THERAPY:  1) Tarceva 150 mg by mouth daily. Started 11/25/2013. Status post 5 months of treatment discontinued today secondary to intolerance with persistent paronychia. 2) Tarceva 100 mg by mouth daily. Started 06/03/2014. Status post 8 months of treatment.   CURRENT THERAPY: Tagrisso 80 mg by mouth daily. First dose started 03/04/2015. Status post 4 months of treatment.  INTERVAL HISTORY: Debra Hodges 65 y.o. female returns to the clinic today for followup visit accompanied by her husband. The patient is feeling fine today with no specific complaints. She was started on treatment with Tagrisso 80 mg by mouth daily status post 4 months of treatment and has been tolerating her treatment well with no significant adverse effects. She denied having any significant skin rash or diarrhea. She denied having any significant chest pain, shortness of breath, cough or hemoptysis. She denied having any significant weight loss or night sweats. She has no nausea or vomiting. She had repeat CBC and comprehensive metabolic panel earlier today and she is here today for evaluation and discussion of her lab results.  MEDICAL HISTORY: Past Medical History  Diagnosis Date  . High blood pressure   . Non-small cell carcinoma of lung, stage 4 ([place]) dx'd 10/2013    ALLERGIES:  is allergic to penicillins.  MEDICATIONS:  Current Outpatient Prescriptions  Medication Sig Dispense Refill  . acetaminophen (TYLENOL) 325 MG tablet Take 650 mg by mouth every 6 (six) hours as  needed. Reported on 06/15/2015    . calcium carbonate (OS-CAL) 600 MG TABS tablet Take 600 mg by mouth daily.     Marland Kitchen CLIMARA 0.1 MG/24HR patch 1 patch once a week.    . levothyroxine (SYNTHROID, LEVOTHROID) 25 MCG tablet Take 25 mcg by mouth daily before breakfast.    . loperamide (IMODIUM) 2 MG capsule Take by mouth as needed for diarrhea or loose stools. Reported on 06/15/2015    . metroNIDAZOLE (METROCREAM) 0.75 % cream Apply 1 application topically daily.    . Multiple Vitamins-Calcium (ONE-A-DAY WOMENS PO) Take 1 tablet by mouth daily.    Marland Kitchen osimertinib mesylate (TAGRISSO) 80 MG tablet Take 1 tablet (80 mg total) by mouth daily. 30 tablet 2  . potassium chloride SA (K-DUR,KLOR-CON) 20 MEQ tablet 1 tablet 2 (two) times daily.    Marland Kitchen spironolactone (ALDACTONE) 25 MG tablet 1 tablet daily.    . vitamin C (ASCORBIC ACID) 500 MG tablet Take 500 mg by mouth daily.     No current facility-administered medications for this visit.    SURGICAL HISTORY:  Past Surgical History  Procedure Laterality Date  . Abdominal hysterectomy    . Breast biopsy      Right breast  . Video bronchoscopy Bilateral 10/27/2013    Procedure: VIDEO BRONCHOSCOPY WITH FLUORO;  Surgeon: Collene Gobble, MD;  Location: [facility];  Service: Cardiopulmonary;  Laterality: Bilateral;    REVIEW OF SYSTEMS:  A comprehensive review of systems was negative.   PHYSICAL EXAMINATION: General appearance: alert, cooperative and no distress Head: Normocephalic, without obvious abnormality, atraumatic Neck: no adenopathy, no JVD, supple, symmetrical, trachea  midline and thyroid not enlarged, symmetric, no tenderness/mass/nodules Lymph nodes: Cervical, supraclavicular, and axillary nodes normal. Resp: clear to auscultation bilaterally Back: symmetric, no curvature. ROM normal. No CVA tenderness. Cardio: regular rate and rhythm, S1, S2 normal, no murmur, click, rub or gallop GI: soft, non-tender; bowel sounds normal; no masses,  no  organomegaly Extremities: extremities normal, atraumatic, no cyanosis or edema and With paronychia involving the left big toe. Neurologic: Alert and oriented X 3, normal strength and tone. Normal symmetric reflexes. Normal coordination and gait   ECOG PERFORMANCE STATUS: 1 - Symptomatic but completely ambulatory  Blood pressure 137/59, pulse 82, temperature 97.8 F (36.6 C), temperature source Oral, resp. rate 18, height '5\' 6"'  (1.676 m), weight 214 lb (97.07 kg), SpO2 100 %.  LABORATORY DATA: Lab Results  Component Value Date   WBC 7.8 07/19/2015   HGB 12.3 07/19/2015   HCT 37.1 07/19/2015   MCV 86.9 07/19/2015   PLT 178 07/19/2015      Chemistry      Component Value Date/Time   NA 139 06/09/2015 0920   K 4.0 06/09/2015 0920   CO2 24 06/09/2015 0920   BUN 15.0 06/09/2015 0920   CREATININE 0.9 06/09/2015 0920   CREATININE 0.71 11/06/2013 1813      Component Value Date/Time   CALCIUM 9.3 06/09/2015 0920   ALKPHOS 67 06/09/2015 0920   AST 24 06/09/2015 0920   ALT 19 06/09/2015 0920   BILITOT 0.54 06/09/2015 0920       RADIOGRAPHIC STUDIES: No results found. ASSESSMENT AND PLAN: This is a very pleasant 65 years old white female recently diagnosed with stage IV non-small cell lung cancer, adenocarcinoma with positive EGFR mutation in exon 21 (L858R) and recently developed T790M resistant mutation. She was treated with Tarceva 150 mg by mouth daily status post 5 months of treatment and tolerating it fairly well except for the skin rash and occasional diarrhea as well as paronychia of the toes.  She completed treatment with Tarceva 100 mg by mouth daily for the last 8 months. This was discontinued secondary to disease progression. The recent molecular studies showed evidence for T790M resistant mutation. The patient was started on treatment with Tagrisso 80 mg by mouth daily on 03/04/2015 and has been tolerating her treatment fairly well with no significant adverse  effects. CBC today is unremarkable for any abnormality. Comprehensive metabolic panel is still pending. I recommended for the patient to continue her current treatment with Tagrisso. I will see her back for follow-up visit in one month for reevaluation with repeat blood work.  She was advised to call immediately if she has any concerning symptoms in the interval. The patient voices understanding of current disease status and treatment options and is in agreement with the current care plan.  All questions were answered. The patient knows to call the clinic with any problems, questions or concerns. We can certainly see the patient much sooner if necessary.  Disclaimer: This note was dictated with voice recognition software. Similar sounding words can inadvertently be transcribed and may not be corrected upon review.

## 2015-07-29 MED FILL — *TAGRISSO 80MG TABLET: 80 | 30 days supply | Qty: 30 | Fill #2

## 2015-08-09 MED FILL — CLIMARA 0.1 MG/DAY PATCH: 0.1 | 29 days supply | Qty: 4 | Fill #7

## 2015-08-19 ENCOUNTER — Other Ambulatory Visit (HOSPITAL_BASED_OUTPATIENT_CLINIC_OR_DEPARTMENT_OTHER): Payer: Medicare Other

## 2015-08-19 ENCOUNTER — Ambulatory Visit (HOSPITAL_BASED_OUTPATIENT_CLINIC_OR_DEPARTMENT_OTHER): Payer: Managed Care, Other (non HMO) | Admitting: Internal Medicine

## 2015-08-19 ENCOUNTER — Encounter: Payer: Self-pay | Admitting: Internal Medicine

## 2015-08-19 ENCOUNTER — Telehealth: Payer: Self-pay | Admitting: Internal Medicine

## 2015-08-19 VITALS — BP 140/60 | HR 84 | Temp 97.5°F | Resp 17 | Ht 66.0 in | Wt 213.6 lb

## 2015-08-19 DIAGNOSIS — C3492 Malignant neoplasm of unspecified part of left bronchus or lung: Secondary | ICD-10-CM

## 2015-08-19 DIAGNOSIS — L03031 Cellulitis of right toe: Secondary | ICD-10-CM | POA: Diagnosis not present

## 2015-08-19 DIAGNOSIS — C3412 Malignant neoplasm of upper lobe, left bronchus or lung: Secondary | ICD-10-CM

## 2015-08-19 DIAGNOSIS — Z5111 Encounter for antineoplastic chemotherapy: Secondary | ICD-10-CM

## 2015-08-19 LAB — CBC WITH DIFFERENTIAL/PLATELET
BASO%: 0.1 % (ref 0.0–2.0)
Basophils Absolute: 0 10*3/uL (ref 0.0–0.1)
EOS ABS: 0.2 10*3/uL (ref 0.0–0.5)
EOS%: 3 % (ref 0.0–7.0)
HCT: 37.5 % (ref 34.8–46.6)
HGB: 12.5 g/dL (ref 11.6–15.9)
LYMPH%: 27.7 % (ref 14.0–49.7)
MCH: 29 pg (ref 25.1–34.0)
MCHC: 33.3 g/dL (ref 31.5–36.0)
MCV: 87 fL (ref 79.5–101.0)
MONO#: 0.7 10*3/uL (ref 0.1–0.9)
MONO%: 8.8 % (ref 0.0–14.0)
NEUT#: 4.8 10*3/uL (ref 1.5–6.5)
NEUT%: 60.4 % (ref 38.4–76.8)
PLATELETS: 189 10*3/uL (ref 145–400)
RBC: 4.31 10*6/uL (ref 3.70–5.45)
RDW: 14.1 % (ref 11.2–14.5)
WBC: 7.9 10*3/uL (ref 3.9–10.3)
lymph#: 2.2 10*3/uL (ref 0.9–3.3)

## 2015-08-19 LAB — COMPREHENSIVE METABOLIC PANEL
ALT: 17 U/L (ref 0–55)
ANION GAP: 8 meq/L (ref 3–11)
AST: 22 U/L (ref 5–34)
Albumin: 3.9 g/dL (ref 3.5–5.0)
Alkaline Phosphatase: 69 U/L (ref 40–150)
BILIRUBIN TOTAL: 0.53 mg/dL (ref 0.20–1.20)
BUN: 18 mg/dL (ref 7.0–26.0)
CHLORIDE: 107 meq/L (ref 98–109)
CO2: 26 meq/L (ref 22–29)
Calcium: 9.1 mg/dL (ref 8.4–10.4)
Creatinine: 0.9 mg/dL (ref 0.6–1.1)
EGFR: 70 mL/min/{1.73_m2} — AB (ref >90)
Glucose: 86 mg/dl (ref 70–140)
POTASSIUM: 3.7 meq/L (ref 3.5–5.1)
SODIUM: 141 meq/L (ref 136–145)
Total Protein: 7.9 g/dL (ref 6.4–8.3)

## 2015-08-19 MED ORDER — DOXYCYCLINE HYCLATE 100 MG PO TABS: 100.0000 mg | ORAL_TABLET | Freq: Two times a day (BID) | ORAL | Status: DC

## 2015-08-19 MED FILL — DOXYCYCLINE HYCLATE 100 MG: 100 | 10 days supply | Qty: 20 | Fill #0

## 2015-08-19 NOTE — Progress Notes (Signed)
[place] Telephone:([phone]   Fax:([phone]  OFFICE PROGRESS NOTE  DIAGNOSIS: Stage IV (T1b, N3, M1b) non-small cell lung cancer, adenocarcinoma with positive EGFR mutation in exon 21 (L858R) and recently found to have T790M resistant mutation, presented with left upper lobe lung mass in addition to left hilar and bilateral mediastinal lymphadenopathy as well as liver metastasis diagnosed in May of 2015.  PRIOR THERAPY:  1) Tarceva 150 mg by mouth daily. Started 11/25/2013. Status post 5 months of treatment discontinued today secondary to intolerance with persistent paronychia. 2) Tarceva 100 mg by mouth daily. Started 06/03/2014. Status post 8 months of treatment.   CURRENT THERAPY: Tagrisso 80 mg by mouth daily. First dose started 03/04/2015. Status post 5 months of treatment.  INTERVAL HISTORY: Debra Hodges 65 y.o. female returns to the clinic today for followup visit accompanied by her husband. The patient is feeling fine today with no specific complaints. She was started on treatment with Tagrisso 80 mg by mouth daily status post 5 months of treatment and has been tolerating her treatment well with no significant adverse effects except for infected right second toe. She denied having any significant skin rash or diarrhea. She denied having any significant chest pain, shortness of breath, cough or hemoptysis. She denied having any significant weight loss or night sweats. She has no nausea or vomiting. She had repeat CBC and comprehensive metabolic panel earlier today and she is here today for evaluation and discussion of her lab results.  MEDICAL HISTORY: Past Medical History  Diagnosis Date  . High blood pressure   . Non-small cell carcinoma of lung, stage 4 ([place]) dx'd 10/2013    ALLERGIES:  is allergic to penicillins.  MEDICATIONS:  Current Outpatient Prescriptions  Medication Sig Dispense Refill  . acetaminophen (TYLENOL) 325 MG tablet Take 650  mg by mouth every 6 (six) hours as needed. Reported on 06/15/2015    . calcium carbonate (OS-CAL) 600 MG TABS tablet Take 600 mg by mouth daily.     Marland Kitchen CLIMARA 0.1 MG/24HR patch 1 patch once a week.    . levothyroxine (SYNTHROID, LEVOTHROID) 25 MCG tablet Take 25 mcg by mouth daily before breakfast.    . loperamide (IMODIUM) 2 MG capsule Take by mouth as needed for diarrhea or loose stools. Reported on 06/15/2015    . metroNIDAZOLE (METROCREAM) 0.75 % cream Apply 1 application topically daily.    . Multiple Vitamins-Calcium (ONE-A-DAY WOMENS PO) Take 1 tablet by mouth daily.    Marland Kitchen osimertinib mesylate (TAGRISSO) 80 MG tablet Take 1 tablet (80 mg total) by mouth daily. 30 tablet 2  . potassium chloride SA (K-DUR,KLOR-CON) 20 MEQ tablet 1 tablet 2 (two) times daily.    Marland Kitchen spironolactone (ALDACTONE) 25 MG tablet 1 tablet daily.    . vitamin C (ASCORBIC ACID) 500 MG tablet Take 500 mg by mouth daily.     No current facility-administered medications for this visit.    SURGICAL HISTORY:  Past Surgical History  Procedure Laterality Date  . Abdominal hysterectomy    . Breast biopsy      Right breast  . Video bronchoscopy Bilateral 10/27/2013    Procedure: VIDEO BRONCHOSCOPY WITH FLUORO;  Surgeon: Collene Gobble, MD;  Location: [facility];  Service: Cardiopulmonary;  Laterality: Bilateral;    REVIEW OF SYSTEMS:  A comprehensive review of systems was negative except for: Infection of the right second toe   PHYSICAL EXAMINATION: General appearance: alert, cooperative and no distress  Head: Normocephalic, without obvious abnormality, atraumatic Neck: no adenopathy, no JVD, supple, symmetrical, trachea midline and thyroid not enlarged, symmetric, no tenderness/mass/nodules Lymph nodes: Cervical, supraclavicular, and axillary nodes normal. Resp: clear to auscultation bilaterally Back: symmetric, no curvature. ROM normal. No CVA tenderness. Cardio: regular rate and rhythm, S1, S2 normal, no murmur,  click, rub or gallop GI: soft, non-tender; bowel sounds normal; no masses,  no organomegaly Extremities: extremities normal, atraumatic, no cyanosis or edema and With paronychia involving the left big toe. Neurologic: Alert and oriented X 3, normal strength and tone. Normal symmetric reflexes. Normal coordination and gait   ECOG PERFORMANCE STATUS: 1 - Symptomatic but completely ambulatory  Blood pressure 140/60, pulse 84, temperature 97.5 F (36.4 C), temperature source Oral, resp. rate 17, height _0  (1.676 m), weight 213 lb 9.6 oz (96.888 kg), SpO2 100 %.  LABORATORY DATA: Lab Results  Component Value Date   WBC 7.9 08/19/2015   HGB 12.5 08/19/2015   HCT 37.5 08/19/2015   MCV 87.0 08/19/2015   PLT 189 08/19/2015      Chemistry      Component Value Date/Time   NA 140 07/19/2015 0829   K 3.6 07/19/2015 0829   CO2 23 07/19/2015 0829   BUN 18.3 07/19/2015 0829   CREATININE 0.9 07/19/2015 0829   CREATININE 0.71 11/06/2013 1813      Component Value Date/Time   CALCIUM 8.7 07/19/2015 0829   ALKPHOS 71 07/19/2015 0829   AST 21 07/19/2015 0829   ALT 19 07/19/2015 0829   BILITOT 0.35 07/19/2015 0829       RADIOGRAPHIC STUDIES: No results found. ASSESSMENT AND PLAN: This is a very pleasant 65 years old white female recently diagnosed with stage IV non-small cell lung cancer, adenocarcinoma with positive EGFR mutation in exon 21 (L858R) and recently developed T790M resistant mutation. She was treated with Tarceva 150 mg by mouth daily status post 5 months of treatment and tolerating it fairly well except for the skin rash and occasional diarrhea as well as paronychia of the toes.  She completed treatment with Tarceva 100 mg by mouth daily for the last 8 months. This was discontinued secondary to disease progression. The recent molecular studies showed evidence for T790M resistant mutation. The patient was started on treatment with Tagrisso 80 mg by mouth daily on 03/04/2015  Status post 5 months of treatment and has been tolerating her treatment fairly well with no significant adverse effects. CBC today is unremarkable for any abnormality. Comprehensive metabolic panel is still pending. I recommended for the patient to continue her current treatment with Tagrisso. I will see her back for follow-up visit in one month for reevaluation with repeat blood work in addition to a CT scan of the chest, abdomen and pelvis for restaging of her disease.  For the cellulitis and paronychia of the right second toe, I started the patient on doxycycline 100 mg by mouth twice a day for 10 days. She was advised to call immediately if she has any concerning symptoms in the interval. The patient voices understanding of current disease status and treatment options and is in agreement with the current care plan.  All questions were answered. The patient knows to call the clinic with any problems, questions or concerns. We can certainly see the patient much sooner if necessary.  Disclaimer: This note was dictated with voice recognition software. Similar sounding words can inadvertently be transcribed and may not be corrected upon review.

## 2015-08-19 NOTE — Telephone Encounter (Signed)
per pof to sch pt appt-gave pt copy of avs °

## 2015-08-19 NOTE — Addendum Note (Signed)
Addended by: Lucile Crater on: 08/19/2015 11:09 AM   Modules accepted: Orders, Medications

## 2015-08-19 NOTE — Progress Notes (Signed)
Antibiotic resent to [facility]

## 2015-08-24 ENCOUNTER — Encounter: Payer: Self-pay | Admitting: Internal Medicine

## 2015-08-24 NOTE — Progress Notes (Signed)
tagrisso -- [employer] application faxed  [phone]

## 2015-08-25 ENCOUNTER — Telehealth: Payer: Self-pay | Admitting: Medical Oncology

## 2015-08-25 NOTE — Telephone Encounter (Signed)
Missing information needed.  Please fax  Miohamed's  DEA , license number , zip code and copy of pts insurance cards -medicare part d front and back to [phone]. CC to Ra[facility]

## 2015-08-26 ENCOUNTER — Other Ambulatory Visit: Payer: Self-pay | Admitting: Internal Medicine

## 2015-08-26 MED FILL — *TAGRISSO 80MG TABLET: 80 | 30 days supply | Qty: 30 | Fill #0

## 2015-09-06 MED FILL — CLIMARA 0.1 MG/DAY PATCH: 0.1 | 29 days supply | Qty: 4 | Fill #8

## 2015-09-09 ENCOUNTER — Encounter: Payer: Self-pay | Admitting: Internal Medicine

## 2015-09-09 NOTE — Progress Notes (Signed)
Spoke with patient she said [employer] have not recd the addl info they req on 08/25/15. I refaxed the app I sent 08/24/15 just in case. She said they had that. I sent mess to Diane to see where she faxed her info-see notes.

## 2015-09-10 ENCOUNTER — Telehealth: Payer: Self-pay | Admitting: Medical Oncology

## 2015-09-10 NOTE — Telephone Encounter (Signed)
tagrisson rx faxed to [facility]

## 2015-09-16 ENCOUNTER — Telehealth: Payer: Self-pay | Admitting: Medical Oncology

## 2015-09-16 NOTE — Telephone Encounter (Signed)
Faxed confirmation received.

## 2015-09-17 ENCOUNTER — Ambulatory Visit (HOSPITAL_COMMUNITY)
Admission: RE | Admit: 2015-09-17 | Discharge: 2015-09-17 | Disposition: A | Discharge status: Home / Self Care | Payer: Medicare Other | Source: Ambulatory Visit | Attending: Internal Medicine | Admitting: Internal Medicine

## 2015-09-17 ENCOUNTER — Other Ambulatory Visit (HOSPITAL_BASED_OUTPATIENT_CLINIC_OR_DEPARTMENT_OTHER): Payer: Medicare Other

## 2015-09-17 DIAGNOSIS — N200 Calculus of kidney: Secondary | ICD-10-CM | POA: Diagnosis not present

## 2015-09-17 DIAGNOSIS — C787 Secondary malignant neoplasm of liver and intrahepatic bile duct: Secondary | ICD-10-CM | POA: Insufficient documentation

## 2015-09-17 DIAGNOSIS — C3412 Malignant neoplasm of upper lobe, left bronchus or lung: Secondary | ICD-10-CM

## 2015-09-17 DIAGNOSIS — Z5111 Encounter for antineoplastic chemotherapy: Secondary | ICD-10-CM

## 2015-09-17 DIAGNOSIS — Z9221 Personal history of antineoplastic chemotherapy: Secondary | ICD-10-CM | POA: Insufficient documentation

## 2015-09-17 DIAGNOSIS — C3492 Malignant neoplasm of unspecified part of left bronchus or lung: Secondary | ICD-10-CM

## 2015-09-17 LAB — CBC WITH DIFFERENTIAL/PLATELET
BASO%: 0.2 % (ref 0.0–2.0)
Basophils Absolute: 0 10*3/uL (ref 0.0–0.1)
EOS ABS: 0.2 10*3/uL (ref 0.0–0.5)
EOS%: 2.6 % (ref 0.0–7.0)
HCT: 38.1 % (ref 34.8–46.6)
HEMOGLOBIN: 12.7 g/dL (ref 11.6–15.9)
LYMPH%: 24 % (ref 14.0–49.7)
MCH: 29.1 pg (ref 25.1–34.0)
MCHC: 33.3 g/dL (ref 31.5–36.0)
MCV: 87.2 fL (ref 79.5–101.0)
MONO#: 0.7 10*3/uL (ref 0.1–0.9)
MONO%: 8.2 % (ref 0.0–14.0)
NEUT%: 65 % (ref 38.4–76.8)
NEUTROS ABS: 5.5 10*3/uL (ref 1.5–6.5)
Platelets: 210 10*3/uL (ref 145–400)
RBC: 4.37 10*6/uL (ref 3.70–5.45)
RDW: 13.9 % (ref 11.2–14.5)
WBC: 8.4 10*3/uL (ref 3.9–10.3)
lymph#: 2 10*3/uL (ref 0.9–3.3)

## 2015-09-17 LAB — COMPREHENSIVE METABOLIC PANEL
ALBUMIN: 3.7 g/dL (ref 3.5–5.0)
ALK PHOS: 71 U/L (ref 40–150)
ALT: 11 U/L (ref 0–55)
AST: 21 U/L (ref 5–34)
Anion Gap: 10 mEq/L (ref 3–11)
BILIRUBIN TOTAL: 0.49 mg/dL (ref 0.20–1.20)
BUN: 13.3 mg/dL (ref 7.0–26.0)
CO2: 24 meq/L (ref 22–29)
Calcium: 9.1 mg/dL (ref 8.4–10.4)
Chloride: 105 mEq/L (ref 98–109)
Creatinine: 0.9 mg/dL (ref 0.6–1.1)
EGFR: 71 mL/min/{1.73_m2} — ABNORMAL LOW (ref >90)
GLUCOSE: 87 mg/dL (ref 70–140)
Potassium: 4.4 mEq/L (ref 3.5–5.1)
SODIUM: 140 meq/L (ref 136–145)
TOTAL PROTEIN: 7.6 g/dL (ref 6.4–8.3)

## 2015-09-17 MED ORDER — IOPAMIDOL (ISOVUE-300) INJECTION 61%
100.0000 mL | Freq: Once | INTRAVENOUS | Status: AC | PRN
  Administered 2015-09-17: 100 mL via INTRAVENOUS

## 2015-09-23 ENCOUNTER — Ambulatory Visit (HOSPITAL_BASED_OUTPATIENT_CLINIC_OR_DEPARTMENT_OTHER): Payer: Medicare Other | Admitting: Internal Medicine

## 2015-09-23 ENCOUNTER — Telehealth: Payer: Self-pay | Admitting: Internal Medicine

## 2015-09-23 ENCOUNTER — Encounter: Payer: Self-pay | Admitting: Internal Medicine

## 2015-09-23 VITALS — BP 127/63 | HR 98 | Temp 97.9°F | Resp 18 | Ht 66.0 in | Wt 215.3 lb

## 2015-09-23 DIAGNOSIS — R21 Rash and other nonspecific skin eruption: Secondary | ICD-10-CM

## 2015-09-23 DIAGNOSIS — C3412 Malignant neoplasm of upper lobe, left bronchus or lung: Secondary | ICD-10-CM

## 2015-09-23 DIAGNOSIS — L03031 Cellulitis of right toe: Secondary | ICD-10-CM | POA: Diagnosis not present

## 2015-09-23 DIAGNOSIS — Z5111 Encounter for antineoplastic chemotherapy: Secondary | ICD-10-CM

## 2015-09-23 NOTE — Telephone Encounter (Signed)
Gave and printed appt shced and avs for pt for May

## 2015-09-23 NOTE — Progress Notes (Signed)
[place] Telephone:(336) [phone]   Fax:(336) [phone]  OFFICE PROGRESS NOTE  DIAGNOSIS: Stage IV (T1b, N3, M1b) non-small cell lung cancer, adenocarcinoma with positive EGFR mutation in exon 21 (L858R) and recently found to have T790M resistant mutation, presented with left upper lobe lung mass in addition to left hilar and bilateral mediastinal lymphadenopathy as well as liver metastasis diagnosed in May of 2015.  PRIOR THERAPY:  1) Tarceva 150 mg by mouth daily. Started 11/25/2013. Status post 5 months of treatment discontinued today secondary to intolerance with persistent paronychia. 2) Tarceva 100 mg by mouth daily. Started 06/03/2014. Status post 8 months of treatment.   CURRENT THERAPY: Tagrisso 80 mg by mouth daily. First dose started 03/04/2015. Status post 6 months of treatment.  INTERVAL HISTORY: Debra Hodges 65 y.o. female returns to the clinic today for followup visit accompanied by her husband. The patient is feeling fine today with no specific complaints. She is tolerating her treatment with Tagrisso 80 mg by mouth daily fairly well with no significant adverse effects. The infection of the right second toe is improving. She denied having any significant skin rash or diarrhea. She denied having any significant chest pain, shortness of breath, cough or hemoptysis. She denied having any significant weight loss or night sweats. She has no nausea or vomiting. She had repeat blood work and CT scan of the chest, abdomen and pelvis performed recently and she is here today for evaluation and discussion of her lab results.  MEDICAL HISTORY: Past Medical History  Diagnosis Date  . High blood pressure   . Non-small cell carcinoma of lung, stage 4 ([place]) dx'd 10/2013    ALLERGIES:  is allergic to penicillins.  MEDICATIONS:  Current Outpatient Prescriptions  Medication Sig Dispense Refill  . acetaminophen (TYLENOL) 325 MG tablet Take 650 mg by mouth every 6 (six)  hours as needed. Reported on 06/15/2015    . calcium carbonate (OS-CAL) 600 MG TABS tablet Take 600 mg by mouth daily.     Marland Kitchen CLIMARA 0.1 MG/24HR patch 1 patch once a week.    . doxycycline (VIBRA-TABS) 100 MG tablet Take 1 tablet (100 mg total) by mouth 2 (two) times daily. 20 tablet 0  . levothyroxine (SYNTHROID, LEVOTHROID) 25 MCG tablet Take 25 mcg by mouth daily before breakfast.    . loperamide (IMODIUM) 2 MG capsule Take by mouth as needed for diarrhea or loose stools. Reported on 06/15/2015    . metroNIDAZOLE (METROCREAM) 0.75 % cream Apply 1 application topically daily.    . Multiple Vitamins-Calcium (ONE-A-DAY WOMENS PO) Take 1 tablet by mouth daily.    . Osimertinib Mesylate (TAGRISSO PO)   2  . potassium chloride SA (K-DUR,KLOR-CON) 20 MEQ tablet 1 tablet 2 (two) times daily.    Marland Kitchen spironolactone (ALDACTONE) 25 MG tablet 1 tablet daily.    Marland Kitchen TAGRISSO 80 MG tablet TAKE 1 TABLET BY MOUTH DAILY. 30 tablet 2  . vitamin C (ASCORBIC ACID) 500 MG tablet Take 500 mg by mouth daily.     No current facility-administered medications for this visit.    SURGICAL HISTORY:  Past Surgical History  Procedure Laterality Date  . Abdominal hysterectomy    . Breast biopsy      Right breast  . Video bronchoscopy Bilateral 10/27/2013    Procedure: VIDEO BRONCHOSCOPY WITH FLUORO;  Surgeon: Collene Gobble, MD;  Location: [facility];  Service: Cardiopulmonary;  Laterality: Bilateral;    REVIEW OF SYSTEMS:  Constitutional: negative  Eyes: negative Ears, nose, mouth, throat, and face: negative Respiratory: negative Cardiovascular: negative Gastrointestinal: negative Genitourinary:negative Integument/breast: negative Hematologic/lymphatic: negative Musculoskeletal:negative Neurological: negative Behavioral/Psych: negative Endocrine: negative Allergic/Immunologic: negative   PHYSICAL EXAMINATION: General appearance: alert, cooperative and no distress Head: Normocephalic, without obvious  abnormality, atraumatic Neck: no adenopathy, no JVD, supple, symmetrical, trachea midline and thyroid not enlarged, symmetric, no tenderness/mass/nodules Lymph nodes: Cervical, supraclavicular, and axillary nodes normal. Resp: clear to auscultation bilaterally Back: symmetric, no curvature. ROM normal. No CVA tenderness. Cardio: regular rate and rhythm, S1, S2 normal, no murmur, click, rub or gallop GI: soft, non-tender; bowel sounds normal; no masses,  no organomegaly Extremities: extremities normal, atraumatic, no cyanosis or edema and With paronychia involving the left big toe. Neurologic: Alert and oriented X 3, normal strength and tone. Normal symmetric reflexes. Normal coordination and gait   ECOG PERFORMANCE STATUS: 1 - Symptomatic but completely ambulatory  Blood pressure 127/63, pulse 98, temperature 97.9 F (36.6 C), temperature source Oral, resp. rate 18, height 5\' 6"  (1.676 m), weight 215 lb 4.8 oz (97.659 kg), SpO2 98 %.  LABORATORY DATA: Lab Results  Component Value Date   WBC 8.4 09/17/2015   HGB 12.7 09/17/2015   HCT 38.1 09/17/2015   MCV 87.2 09/17/2015   PLT 210 09/17/2015      Chemistry      Component Value Date/Time   NA 140 09/17/2015 0808   K 4.4 09/17/2015 0808   CO2 24 09/17/2015 0808   BUN 13.3 09/17/2015 0808   CREATININE 0.9 09/17/2015 0808   CREATININE 0.71 11/06/2013 1813      Component Value Date/Time   CALCIUM 9.1 09/17/2015 0808   ALKPHOS 71 09/17/2015 0808   AST 21 09/17/2015 0808   ALT 11 09/17/2015 0808   BILITOT 0.49 09/17/2015 0808       RADIOGRAPHIC STUDIES: Ct Chest W Contrast  09/17/2015  CLINICAL DATA:  Left lung cancer, ongoing oral chemotherapy. EXAM: CT CHEST, ABDOMEN, AND PELVIS WITH CONTRAST TECHNIQUE: Multidetector CT imaging of the chest, abdomen and pelvis was performed following the standard protocol during bolus administration of intravenous contrast. CONTRAST:  09/19/2015 ISOVUE-300 IOPAMIDOL (ISOVUE-300) INJECTION 61%  COMPARISON:  06/09/2015. FINDINGS: CT CHEST FINDINGS Mediastinum/Lymph Nodes: 13 mm heterogeneous low-attenuation lesion in the right lobe of the thyroid, as before. No pathologically enlarged mediastinal, hilar or axillary lymph nodes. Atherosclerotic calcification of the arterial vasculature. Heart size is at the upper limits of normal. No pericardial effusion. Small hiatal hernia. Lungs/Pleura: Nodular collapse/ consolidation in the lingula, with surrounding architectural distortion, unchanged. A few scattered pulmonary nodules measure 5 mm or less in size, stable. Mild scattered pulmonary parenchymal scarring. No pleural fluid. Airway is unremarkable. Musculoskeletal: No worrisome lytic or sclerotic lesions. CT ABDOMEN PELVIS FINDINGS Hepatobiliary: Vague low-attenuation lesion in the dome of the liver measures approximately 11 mm, likely stable when remeasured. Liver and gallbladder are otherwise unremarkable. No biliary ductal dilatation. Pancreas: Negative. Spleen: Low-attenuation lesion in the spleen measures 8 mm, stable. Adrenals/Urinary Tract: Adrenal glands are unremarkable. Sub cm low-attenuation lesion in the right kidney is too small to characterize. 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 Small stone in the left kidney. Ureters are decompressed. Bladder is unremarkable. Stomach/Bowel: Tiny hiatal hernia. Stomach, small bowel and colon are unremarkable. Appendix is not readily visualized. Vascular/Lymphatic: Atherosclerotic calcification of the arterial vasculature without abdominal aortic aneurysm. No pathologically enlarged lymph nodes. Reproductive: Uterus appears to be surgically absent. Ovaries are not  well-visualized. Other: No free fluid.  Mesenteries and peritoneum are unremarkable. Musculoskeletal: No worrisome lytic or sclerotic lesions. IMPRESSION: 1. Stable post treatment changes in the lingula. Vague low-attenuation metastasis in  the liver, likely stable when remeasured. 2. Nonobstructing left renal stone. Electronically Signed   By: Lorin Picket M.D.   On: 09/17/2015 09:55   Ct Abdomen Pelvis W Contrast  09/17/2015  CLINICAL DATA:  Left lung cancer, ongoing oral chemotherapy. EXAM: CT CHEST, ABDOMEN, AND PELVIS WITH CONTRAST TECHNIQUE: Multidetector CT imaging of the chest, abdomen and pelvis was performed following the standard protocol during bolus administration of intravenous contrast. CONTRAST:  124m ISOVUE-300 IOPAMIDOL (ISOVUE-300) INJECTION 61% COMPARISON:  06/09/2015. FINDINGS: CT CHEST FINDINGS Mediastinum/Lymph Nodes: 13 mm heterogeneous low-attenuation lesion in the right lobe of the thyroid, as before. No pathologically enlarged mediastinal, hilar or axillary lymph nodes. Atherosclerotic calcification of the arterial vasculature. Heart size is at the upper limits of normal. No pericardial effusion. Small hiatal hernia. Lungs/Pleura: Nodular collapse/ consolidation in the lingula, with surrounding architectural distortion, unchanged. A few scattered pulmonary nodules measure 5 mm or less in size, stable. Mild scattered pulmonary parenchymal scarring. No pleural fluid. Airway is unremarkable. Musculoskeletal: No worrisome lytic or sclerotic lesions. CT ABDOMEN PELVIS FINDINGS Hepatobiliary: Vague low-attenuation lesion in the dome of the liver measures approximately 11 mm, likely stable when remeasured. Liver and gallbladder are otherwise unremarkable. No biliary ductal dilatation. Pancreas: Negative. Spleen: Low-attenuation lesion in the spleen measures 8 mm, stable. Adrenals/Urinary Tract: Adrenal glands are unremarkable. Sub cm low-attenuation lesion in the right kidney is too small to characterize. 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 Small stone in the left kidney. Ureters are decompressed. Bladder is unremarkable. Stomach/Bowel: Tiny hiatal hernia.  Stomach, small bowel and colon are unremarkable. Appendix is not readily visualized. Vascular/Lymphatic: Atherosclerotic calcification of the arterial vasculature without abdominal aortic aneurysm. No pathologically enlarged lymph nodes. Reproductive: Uterus appears to be surgically absent. Ovaries are not well-visualized. Other: No free fluid.  Mesenteries and peritoneum are unremarkable. Musculoskeletal: No worrisome lytic or sclerotic lesions. IMPRESSION: 1. Stable post treatment changes in the lingula. Vague low-attenuation metastasis in the liver, likely stable when remeasured. 2. Nonobstructing left renal stone. Electronically Signed   By: MLorin PicketM.D.   On: 09/17/2015 09:55   ASSESSMENT AND PLAN: This is a very pleasant 65years old white female recently diagnosed with stage IV non-small cell lung cancer, adenocarcinoma with positive EGFR mutation in exon 21 (L858R) and recently developed T790M resistant mutation. She was treated with Tarceva 150 mg by mouth daily status post 5 months of treatment and tolerating it fairly well except for the skin rash and occasional diarrhea as well as paronychia of the toes.  She completed treatment with Tarceva 100 mg by mouth daily for the last 8 months. This was discontinued secondary to disease progression. The recent molecular studies showed evidence for T790M resistant mutation. The patient was started on treatment with Tagrisso 80 mg by mouth daily on 03/04/2015 Status post 6 months of treatment and has been tolerating her treatment fairly well with no significant adverse effects. The recent CT scan of the chest, abdomen and pelvis showed no evidence for disease progression. I discussed the scan results with the patient and her husband. I recommended for the patient to continue her current treatment with Tagrisso. I will see her back for follow-up visit in one month for reevaluation with repeat blood work.  She  was advised to call immediately if she  has any concerning symptoms in the interval. The patient voices understanding of current disease status and treatment options and is in agreement with the current care plan.  All questions were answered. The patient knows to call the clinic with any problems, questions or concerns. We can certainly see the patient much sooner if necessary.  Disclaimer: This note was dictated with voice recognition software. Similar sounding words can inadvertently be transcribed and may not be corrected upon review.

## 2015-10-05 MED FILL — CLIMARA 0.1 MG/DAY PATCH: 0.1 | 29 days supply | Qty: 4 | Fill #9

## 2015-10-21 ENCOUNTER — Encounter: Payer: Self-pay | Admitting: Internal Medicine

## 2015-10-21 ENCOUNTER — Ambulatory Visit (HOSPITAL_BASED_OUTPATIENT_CLINIC_OR_DEPARTMENT_OTHER): Payer: Medicare Other | Admitting: Internal Medicine

## 2015-10-21 ENCOUNTER — Telehealth: Payer: Self-pay | Admitting: Internal Medicine

## 2015-10-21 ENCOUNTER — Other Ambulatory Visit (HOSPITAL_BASED_OUTPATIENT_CLINIC_OR_DEPARTMENT_OTHER): Payer: Medicare Other

## 2015-10-21 VITALS — BP 129/49 | HR 75 | Temp 97.8°F | Resp 18 | Ht 66.0 in | Wt 214.0 lb

## 2015-10-21 DIAGNOSIS — Z5111 Encounter for antineoplastic chemotherapy: Secondary | ICD-10-CM

## 2015-10-21 DIAGNOSIS — C3412 Malignant neoplasm of upper lobe, left bronchus or lung: Secondary | ICD-10-CM | POA: Diagnosis not present

## 2015-10-21 LAB — CBC WITH DIFFERENTIAL/PLATELET
BASO%: 0 % (ref 0.0–2.0)
BASOS ABS: 0 10*3/uL (ref 0.0–0.1)
EOS%: 2.5 % (ref 0.0–7.0)
Eosinophils Absolute: 0.2 10*3/uL (ref 0.0–0.5)
HCT: 37.5 % (ref 34.8–46.6)
HGB: 12.5 g/dL (ref 11.6–15.9)
LYMPH%: 25 % (ref 14.0–49.7)
MCH: 29.1 pg (ref 25.1–34.0)
MCHC: 33.3 g/dL (ref 31.5–36.0)
MCV: 87.2 fL (ref 79.5–101.0)
MONO#: 0.7 10*3/uL (ref 0.1–0.9)
MONO%: 8.8 % (ref 0.0–14.0)
NEUT#: 5.2 10*3/uL (ref 1.5–6.5)
NEUT%: 63.7 % (ref 38.4–76.8)
Platelets: 189 10*3/uL (ref 145–400)
RBC: 4.3 10*6/uL (ref 3.70–5.45)
RDW: 13.9 % (ref 11.2–14.5)
WBC: 8.1 10*3/uL (ref 3.9–10.3)
lymph#: 2 10*3/uL (ref 0.9–3.3)

## 2015-10-21 LAB — COMPREHENSIVE METABOLIC PANEL
ALT: 14 U/L (ref 0–55)
ANION GAP: 8 meq/L (ref 3–11)
AST: 19 U/L (ref 5–34)
Albumin: 3.7 g/dL (ref 3.5–5.0)
Alkaline Phosphatase: 70 U/L (ref 40–150)
BUN: 15.2 mg/dL (ref 7.0–26.0)
CALCIUM: 9.1 mg/dL (ref 8.4–10.4)
CHLORIDE: 108 meq/L (ref 98–109)
CO2: 25 meq/L (ref 22–29)
CREATININE: 0.9 mg/dL (ref 0.6–1.1)
EGFR: 68 mL/min/{1.73_m2} — ABNORMAL LOW (ref >90)
Glucose: 79 mg/dl (ref 70–140)
POTASSIUM: 4.4 meq/L (ref 3.5–5.1)
Sodium: 141 mEq/L (ref 136–145)
Total Bilirubin: 0.37 mg/dL (ref 0.20–1.20)
Total Protein: 7.5 g/dL (ref 6.4–8.3)

## 2015-10-21 NOTE — Progress Notes (Signed)
[facility] [phone]   [phone]  OFFICE PROGRESS NOTE  DIAGNOSIS: Stage IV (T1b, N3, M1b) non-small cell lung cancer, adenocarcinoma with positive EGFR mutation in exon 21 (L858R) and recently found to have T790M resistant mutation, presented with left upper lobe lung mass in addition to left hilar and bilateral mediastinal lymphadenopathy as well as liver metastasis diagnosed in May of 2015.  PRIOR THERAPY:  1) Tarceva 150 mg by mouth daily. Started 11/25/2013. Status post 5 months of treatment discontinued today secondary to intolerance with persistent paronychia. 2) Tarceva 100 mg by mouth daily. Started 06/03/2014. Status post 8 months of treatment.   CURRENT THERAPY: Tagrisso 80 mg by mouth daily. First dose started 03/04/2015. Status post 7 months of treatment.  INTERVAL HISTORY: Debra Hodges 65 y.o. female returns to the clinic today for followup visit accompanied by her husband. The patient is feeling fine today with no specific complaints. She is tolerating her treatment with Tagrisso 80 mg by mouth daily fairly well with no significant adverse effects. She denied having any significant skin rash or diarrhea. She denied having any significant chest pain, shortness of breath, cough or hemoptysis. She denied having any significant weight loss or night sweats. She has no nausea or vomiting. She had repeat CBC and comprehensive metabolic panel performed earlier today and she is here for evaluation and discussion of her lab results.  MEDICAL HISTORY: Past Medical History  Diagnosis Date  . High blood pressure   . Non-small cell carcinoma of lung, stage 4 ([facility]) dx'd 10/2013    ALLERGIES:  is allergic to penicillins.  MEDICATIONS:  Current Outpatient Prescriptions  Medication Sig Dispense Refill  . acetaminophen (TYLENOL) 325 MG tablet Take 650 mg by mouth every 6 (six) hours as needed. Reported on 06/15/2015    . calcium carbonate  (OS-CAL) 600 MG TABS tablet Take 600 mg by mouth daily.     Marland Kitchen CLIMARA 0.1 MG/24HR patch 1 patch once a week.    . doxycycline (VIBRA-TABS) 100 MG tablet Take 1 tablet (100 mg total) by mouth 2 (two) times daily. 20 tablet 0  . levothyroxine (SYNTHROID, LEVOTHROID) 25 MCG tablet Take 25 mcg by mouth daily before breakfast.    . loperamide (IMODIUM) 2 MG capsule Take by mouth as needed for diarrhea or loose stools. Reported on 06/15/2015    . metroNIDAZOLE (METROCREAM) 0.75 % cream Apply 1 application topically daily.    . Multiple Vitamins-Calcium (ONE-A-DAY WOMENS PO) Take 1 tablet by mouth daily.    . potassium chloride SA (K-DUR,KLOR-CON) 20 MEQ tablet 1 tablet 2 (two) times daily.    Marland Kitchen spironolactone (ALDACTONE) 25 MG tablet 1 tablet daily.    Marland Kitchen TAGRISSO 80 MG tablet TAKE 1 TABLET BY MOUTH DAILY. 30 tablet 2  . vitamin C (ASCORBIC ACID) 500 MG tablet Take 500 mg by mouth daily.     No current facility-administered medications for this visit.    SURGICAL HISTORY:  Past Surgical History  Procedure Laterality Date  . Abdominal hysterectomy    . Breast biopsy      Right breast  . Video bronchoscopy Bilateral 10/27/2013    Procedure: VIDEO BRONCHOSCOPY WITH FLUORO;  Surgeon: Collene Gobble, MD;  Location: [facility];  Service: Cardiopulmonary;  Laterality: Bilateral;    REVIEW OF SYSTEMS:  A comprehensive review of systems was negative.   PHYSICAL EXAMINATION: General appearance: alert, cooperative and no distress Head: Normocephalic, without obvious abnormality, atraumatic Neck: no  adenopathy, no JVD, supple, symmetrical, trachea midline and thyroid not enlarged, symmetric, no tenderness/mass/nodules Lymph nodes: Cervical, supraclavicular, and axillary nodes normal. Resp: clear to auscultation bilaterally Back: symmetric, no curvature. ROM normal. No CVA tenderness. Cardio: regular rate and rhythm, S1, S2 normal, no murmur, click, rub or gallop GI: soft, non-tender; bowel sounds  normal; no masses,  no organomegaly Extremities: extremities normal, atraumatic, no cyanosis or edema and With paronychia involving the left big toe. Neurologic: Alert and oriented X 3, normal strength and tone. Normal symmetric reflexes. Normal coordination and gait   ECOG PERFORMANCE STATUS: 1 - Symptomatic but completely ambulatory  Blood pressure 129/49, pulse 75, temperature 97.8 F (36.6 C), temperature source Oral, resp. rate 18, height 5' 6" (1.676 m), weight 214 lb (97.07 kg), SpO2 100 %.  LABORATORY DATA: Lab Results  Component Value Date   WBC 8.1 10/21/2015   HGB 12.5 10/21/2015   HCT 37.5 10/21/2015   MCV 87.2 10/21/2015   PLT 189 10/21/2015      Chemistry      Component Value Date/Time   NA 140 09/17/2015 0808   K 4.4 09/17/2015 0808   CO2 24 09/17/2015 0808   BUN 13.3 09/17/2015 0808   CREATININE 0.9 09/17/2015 0808   CREATININE 0.71 11/06/2013 1813      Component Value Date/Time   CALCIUM 9.1 09/17/2015 0808   ALKPHOS 71 09/17/2015 0808   AST 21 09/17/2015 0808   ALT 11 09/17/2015 0808   BILITOT 0.49 09/17/2015 0808       RADIOGRAPHIC STUDIES: No results found. 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 She was treated with Tarceva 150 mg by mouth daily status post 5 months of treatment and tolerating it fairly well except for the skin rash and occasional diarrhea as well as paronychia of the toes.  She completed treatment with Tarceva 100 mg by mouth daily for the last 8 months. This was discontinued secondary to disease progression. The recent molecular studies showed evidence for T790M resistant mutation. The patient was started on treatment with Tagrisso 80 mg by mouth daily on 03/04/2015, Status post 7 months of treatment and has been tolerating her  treatment fairly well with no significant adverse effects. I recommended for the patient to continue her current treatment with Tagrisso. I will see her back for follow-up visit in one month for reevaluation with repeat blood work.  She was advised to call immediately if she has any concerning symptoms in the interval. The patient voices understanding of current disease status and treatment options and is in agreement with the current care plan.  All questions were answered. The patient knows to call the clinic with any problems, questions or concerns. We can certainly see the patient much sooner if necessary.  Disclaimer: This note was dictated with voice recognition software. Similar sounding words can inadvertently be transcribed and may not be corrected upon review.       

## 2015-10-21 NOTE — Telephone Encounter (Signed)
Gave and printed appt sched and avs for pt for June °

## 2015-10-28 ENCOUNTER — Other Ambulatory Visit: Payer: Self-pay | Admitting: Medical Oncology

## 2015-10-28 ENCOUNTER — Encounter: Payer: Self-pay | Admitting: Internal Medicine

## 2015-10-28 MED ORDER — OSIMERTINIB MESYLATE 80 MG PO TABS: 80.0000 mg | ORAL_TABLET | Freq: Every day | ORAL | Status: DC

## 2015-10-28 NOTE — Progress Notes (Signed)
I sent staff mess to dr. Vanessa [place]. [facility] said script was sent to them on yesterday, but it was blank? I don't see any prev notes of one being sent. I advised they need one faxed to [phone]

## 2015-10-28 NOTE — Progress Notes (Signed)
Tagrisso rx faxed to [facility] and me

## 2015-11-04 ENCOUNTER — Encounter: Payer: Self-pay | Admitting: Internal Medicine

## 2015-11-04 NOTE — Progress Notes (Signed)
tagrisso -- [facility] application faxed&nbsp; [phone]- per [place] patient approved 10/28/15

## 2015-11-10 MED FILL — CLIMARA 0.1 MG/DAY PATCH: 0.1 | 29 days supply | Qty: 4 | Fill #10

## 2015-11-22 ENCOUNTER — Other Ambulatory Visit (HOSPITAL_BASED_OUTPATIENT_CLINIC_OR_DEPARTMENT_OTHER): Payer: Medicare Other

## 2015-11-22 ENCOUNTER — Encounter: Payer: Self-pay | Admitting: Internal Medicine

## 2015-11-22 ENCOUNTER — Ambulatory Visit (HOSPITAL_BASED_OUTPATIENT_CLINIC_OR_DEPARTMENT_OTHER): Payer: Medicare Other | Admitting: Internal Medicine

## 2015-11-22 ENCOUNTER — Telehealth: Payer: Self-pay | Admitting: Internal Medicine

## 2015-11-22 VITALS — BP 131/51 | HR 79 | Temp 98.4°F | Resp 18 | Ht 66.0 in | Wt 214.8 lb

## 2015-11-22 DIAGNOSIS — Z5111 Encounter for antineoplastic chemotherapy: Secondary | ICD-10-CM

## 2015-11-22 DIAGNOSIS — C3412 Malignant neoplasm of upper lobe, left bronchus or lung: Secondary | ICD-10-CM

## 2015-11-22 LAB — COMPREHENSIVE METABOLIC PANEL
ALT: 19 U/L (ref 0–55)
ANION GAP: 9 meq/L (ref 3–11)
AST: 22 U/L (ref 5–34)
Albumin: 3.8 g/dL (ref 3.5–5.0)
Alkaline Phosphatase: 63 U/L (ref 40–150)
BUN: 14.6 mg/dL (ref 7.0–26.0)
CALCIUM: 8.8 mg/dL (ref 8.4–10.4)
CHLORIDE: 108 meq/L (ref 98–109)
CO2: 24 mEq/L (ref 22–29)
Creatinine: 0.9 mg/dL (ref 0.6–1.1)
EGFR: 72 mL/min/{1.73_m2} — AB (ref >90)
Glucose: 93 mg/dl (ref 70–140)
POTASSIUM: 3.4 meq/L — AB (ref 3.5–5.1)
Sodium: 141 mEq/L (ref 136–145)
Total Bilirubin: 0.41 mg/dL (ref 0.20–1.20)
Total Protein: 7.5 g/dL (ref 6.4–8.3)

## 2015-11-22 LAB — CBC WITH DIFFERENTIAL/PLATELET
BASO%: 0 % (ref 0.0–2.0)
Basophils Absolute: 0 10*3/uL (ref 0.0–0.1)
EOS%: 2.3 % (ref 0.0–7.0)
Eosinophils Absolute: 0.2 10*3/uL (ref 0.0–0.5)
HEMATOCRIT: 36.6 % (ref 34.8–46.6)
HGB: 12.2 g/dL (ref 11.6–15.9)
LYMPH#: 2.1 10*3/uL (ref 0.9–3.3)
LYMPH%: 25.3 % (ref 14.0–49.7)
MCH: 28.8 pg (ref 25.1–34.0)
MCHC: 33.3 g/dL (ref 31.5–36.0)
MCV: 86.3 fL (ref 79.5–101.0)
MONO#: 0.6 10*3/uL (ref 0.1–0.9)
MONO%: 7.3 % (ref 0.0–14.0)
NEUT#: 5.4 10*3/uL (ref 1.5–6.5)
NEUT%: 65.1 % (ref 38.4–76.8)
PLATELETS: 187 10*3/uL (ref 145–400)
RBC: 4.24 10*6/uL (ref 3.70–5.45)
RDW: 14 % (ref 11.2–14.5)
WBC: 8.2 10*3/uL (ref 3.9–10.3)

## 2015-11-22 NOTE — Telephone Encounter (Signed)
Gave and printed appt sched and avs for pt for July....gv barium

## 2015-11-22 NOTE — Progress Notes (Signed)
    [facility] [phone]   [phone]  OFFICE PROGRESS NOTE  DIAGNOSIS: Stage IV (T1b, N3, M1b) non-small cell lung cancer, adenocarcinoma with positive EGFR mutation in exon 21 (L858R) and recently found to have T790M resistant mutation, presented with left upper lobe lung mass in addition to left hilar and bilateral mediastinal lymphadenopathy as well as liver metastasis diagnosed in May of 2015.  PRIOR THERAPY:  1) Tarceva 150 mg by mouth daily. Started 11/25/2013. Status post 5 months of treatment discontinued today secondary to intolerance with persistent paronychia. 2) Tarceva 100 mg by mouth daily. Started 06/03/2014. Status post 8 months of treatment.   CURRENT THERAPY: Tagrisso 80 mg by mouth daily. First dose started 03/04/2015. Status post 8 months of treatment.  INTERVAL HISTORY: Debra Hodges 64 y.o. female returns to the clinic today for followup visit. The patient is feeling fine today with no specific complaints. She is tolerating her treatment with Tagrisso 80 mg by mouth daily fairly well with no significant adverse effects. She denied having any significant skin rash or diarrhea. She denied having any significant chest pain, shortness of breath, cough or hemoptysis. She denied having any significant weight loss or night sweats. She has no nausea or vomiting. She has significant improvement of the paronychia of the toes. She had repeat CBC and comprehensive metabolic panel performed earlier today and she is here for evaluation and discussion of her lab results.  MEDICAL HISTORY: Past Medical History  Diagnosis Date  . High blood pressure   . Non-small cell carcinoma of lung, stage 4 ([facility]) dx'd 10/2013    ALLERGIES:  is allergic to penicillins.  MEDICATIONS:  Current Outpatient Prescriptions  Medication Sig Dispense Refill  . acetaminophen (TYLENOL) 325 MG tablet Take 650 mg by mouth every 6 (six) hours as needed. Reported on  06/15/2015    . calcium carbonate (OS-CAL) 600 MG TABS tablet Take 600 mg by mouth daily.     . CLIMARA 0.1 MG/24HR patch 1 patch once a week.    . levothyroxine (SYNTHROID, LEVOTHROID) 25 MCG tablet Take 25 mcg by mouth daily before breakfast.    . Multiple Vitamins-Calcium (ONE-A-DAY WOMENS PO) Take 1 tablet by mouth daily.    . osimertinib mesylate (TAGRISSO) 80 MG tablet Take 1 tablet (80 mg total) by mouth daily. 30 tablet 2  . potassium chloride SA (K-DUR,KLOR-CON) 20 MEQ tablet 1 tablet 2 (two) times daily.    . spironolactone (ALDACTONE) 25 MG tablet 1 tablet daily.    . vitamin C (ASCORBIC ACID) 500 MG tablet Take 500 mg by mouth daily.    . loperamide (IMODIUM) 2 MG capsule Take by mouth as needed for diarrhea or loose stools. Reported on 11/22/2015    . metroNIDAZOLE (METROCREAM) 0.75 % cream Apply 1 application topically daily. Reported on 11/22/2015     No current facility-administered medications for this visit.    SURGICAL HISTORY:  Past Surgical History  Procedure Laterality Date  . Abdominal hysterectomy    . Breast biopsy      Right breast  . Video bronchoscopy Bilateral 10/27/2013    Procedure: VIDEO BRONCHOSCOPY WITH FLUORO;  Surgeon: Robert S Byrum, MD;  Location: [facility];  Service: Cardiopulmonary;  Laterality: Bilateral;    REVIEW OF SYSTEMS:  A comprehensive review of systems was negative.   PHYSICAL EXAMINATION: General appearance: alert, cooperative and no distress Head: Normocephalic, without obvious abnormality, atraumatic Neck: no adenopathy, no JVD, supple, symmetrical, trachea midline and   thyroid not enlarged, symmetric, no tenderness/mass/nodules Lymph nodes: Cervical, supraclavicular, and axillary nodes normal. Resp: clear to auscultation bilaterally Back: symmetric, no curvature. ROM normal. No CVA tenderness. Cardio: regular rate and rhythm, S1, S2 normal, no murmur, click, rub or gallop GI: soft, non-tender; bowel sounds normal; no masses,  no  organomegaly Extremities: extremities normal, atraumatic, no cyanosis or edema and With paronychia involving the left big toe. Neurologic: Alert and oriented X 3, normal strength and tone. Normal symmetric reflexes. Normal coordination and gait   ECOG PERFORMANCE STATUS: 1 - Symptomatic but completely ambulatory  Blood pressure 131/51, pulse 79, temperature 98.4 F (36.9 C), temperature source Oral, resp. rate 18, height 5' 6" (1.676 m), weight 214 lb 12.8 oz (97.433 kg), SpO2 100 %.  LABORATORY DATA: Lab Results  Component Value Date   WBC 8.2 11/22/2015   HGB 12.2 11/22/2015   HCT 36.6 11/22/2015   MCV 86.3 11/22/2015   PLT 187 11/22/2015      Chemistry      Component Value Date/Time   NA 141 10/21/2015 0924   K 4.4 10/21/2015 0924   CO2 25 10/21/2015 0924   BUN 15.2 10/21/2015 0924   CREATININE 0.9 10/21/2015 0924   CREATININE 0.71 11/06/2013 1813      Component Value Date/Time   CALCIUM 9.1 10/21/2015 0924   ALKPHOS 70 10/21/2015 0924   AST 19 10/21/2015 0924   ALT 14 10/21/2015 0924   BILITOT 0.37 10/21/2015 0924       RADIOGRAPHIC STUDIES: No results found. ASSESSMENT AND PLAN: This is a very pleasant 65 years old white female recently diagnosed with stage IV non-small cell lung cancer, adenocarcinoma with positive EGFR mutation in exon 21 (L858R) and recently developed T790M resistant mutation. She was treated with Tarceva 150 mg by mouth daily status post 5 months of treatment and tolerating it fairly well except for the skin rash and occasional diarrhea as well as paronychia of the toes.  She completed treatment with Tarceva 100 mg by mouth daily for the last 8 months. This was discontinued secondary to disease progression. The recent molecular studies showed evidence for T790M resistant mutation. The patient was started on treatment with Tagrisso 80 mg by mouth daily on 03/04/2015, Status post 8 months of treatment and has been tolerating her treatment fairly  well with no significant adverse effects. I recommended for the patient to continue her current treatment with Tagrisso. I will see her back for follow-up visit in one month for reevaluation with repeat blood work as well as CT scan of the chest, abdomen and pelvis for restaging of her disease.  She was advised to call immediately if she has any concerning symptoms in the interval. The patient voices understanding of current disease status and treatment options and is in agreement with the current care plan.  All questions were answered. The patient knows to call the clinic with any problems, questions or concerns. We can certainly see the patient much sooner if necessary.  Disclaimer: This note was dictated with voice recognition software. Similar sounding words can inadvertently be transcribed and may not be corrected upon review.

## 2015-12-13 ENCOUNTER — Ambulatory Visit (HOSPITAL_COMMUNITY)
Admission: RE | Admit: 2015-12-13 | Discharge: 2015-12-13 | Disposition: A | Discharge status: Home / Self Care | Payer: Medicare Other | Source: Ambulatory Visit | Attending: Internal Medicine | Admitting: Internal Medicine

## 2015-12-13 ENCOUNTER — Other Ambulatory Visit (HOSPITAL_BASED_OUTPATIENT_CLINIC_OR_DEPARTMENT_OTHER): Payer: Medicare Other

## 2015-12-13 DIAGNOSIS — N200 Calculus of kidney: Secondary | ICD-10-CM | POA: Diagnosis not present

## 2015-12-13 DIAGNOSIS — C3412 Malignant neoplasm of upper lobe, left bronchus or lung: Secondary | ICD-10-CM | POA: Insufficient documentation

## 2015-12-13 DIAGNOSIS — Z5111 Encounter for antineoplastic chemotherapy: Secondary | ICD-10-CM

## 2015-12-13 DIAGNOSIS — Z9221 Personal history of antineoplastic chemotherapy: Secondary | ICD-10-CM | POA: Diagnosis not present

## 2015-12-13 DIAGNOSIS — C3490 Malignant neoplasm of unspecified part of unspecified bronchus or lung: Secondary | ICD-10-CM | POA: Diagnosis not present

## 2015-12-13 LAB — COMPREHENSIVE METABOLIC PANEL
ALT: 20 U/L (ref 0–55)
AST: 21 U/L (ref 5–34)
Albumin: 3.8 g/dL (ref 3.5–5.0)
Alkaline Phosphatase: 78 U/L (ref 40–150)
Anion Gap: 10 mEq/L (ref 3–11)
BILIRUBIN TOTAL: 0.55 mg/dL (ref 0.20–1.20)
BUN: 13 mg/dL (ref 7.0–26.0)
CALCIUM: 9.6 mg/dL (ref 8.4–10.4)
CHLORIDE: 104 meq/L (ref 98–109)
CO2: 25 meq/L (ref 22–29)
CREATININE: 0.9 mg/dL (ref 0.6–1.1)
EGFR: 67 mL/min/{1.73_m2} — ABNORMAL LOW (ref >90)
Glucose: 86 mg/dl (ref 70–140)
Potassium: 4.1 mEq/L (ref 3.5–5.1)
Sodium: 139 mEq/L (ref 136–145)
TOTAL PROTEIN: 7.9 g/dL (ref 6.4–8.3)

## 2015-12-13 LAB — CBC WITH DIFFERENTIAL/PLATELET
BASO%: 0.2 % (ref 0.0–2.0)
Basophils Absolute: 0 10*3/uL (ref 0.0–0.1)
EOS%: 1.4 % (ref 0.0–7.0)
Eosinophils Absolute: 0.1 10*3/uL (ref 0.0–0.5)
HEMATOCRIT: 37.7 % (ref 34.8–46.6)
HGB: 12.6 g/dL (ref 11.6–15.9)
LYMPH#: 2.1 10*3/uL (ref 0.9–3.3)
LYMPH%: 23.3 % (ref 14.0–49.7)
MCH: 28.8 pg (ref 25.1–34.0)
MCHC: 33.4 g/dL (ref 31.5–36.0)
MCV: 86.2 fL (ref 79.5–101.0)
MONO#: 0.6 10*3/uL (ref 0.1–0.9)
MONO%: 7.1 % (ref 0.0–14.0)
NEUT%: 68 % (ref 38.4–76.8)
NEUTROS ABS: 6.1 10*3/uL (ref 1.5–6.5)
Platelets: 202 10*3/uL (ref 145–400)
RBC: 4.37 10*6/uL (ref 3.70–5.45)
RDW: 13.9 % (ref 11.2–14.5)
WBC: 9 10*3/uL (ref 3.9–10.3)

## 2015-12-13 MED ORDER — IOPAMIDOL (ISOVUE-300) INJECTION 61%
100.0000 mL | Freq: Once | INTRAVENOUS | Status: AC | PRN
  Administered 2015-12-13: 100 mL via INTRAVENOUS

## 2015-12-15 ENCOUNTER — Encounter: Payer: Self-pay | Admitting: Internal Medicine

## 2015-12-15 ENCOUNTER — Telehealth: Payer: Self-pay | Admitting: Medical Oncology

## 2015-12-15 DIAGNOSIS — C3412 Malignant neoplasm of upper lobe, left bronchus or lung: Secondary | ICD-10-CM

## 2015-12-15 MED ORDER — OSIMERTINIB MESYLATE 80 MG PO TABS: 80.0000 mg | ORAL_TABLET | Freq: Every day | ORAL | Status: DC

## 2015-12-15 NOTE — Telephone Encounter (Signed)
Called in tagrisso refill #30 with no refills ( only 1 months supply at a time). Called to [facility] and left a message for the pt.

## 2015-12-15 NOTE — Progress Notes (Signed)
Per [facility] time for refill for Tagrisso. I sent mess to nurse. [phone] option#1.

## 2015-12-20 ENCOUNTER — Telehealth: Payer: Self-pay | Admitting: Internal Medicine

## 2015-12-20 ENCOUNTER — Telehealth: Payer: Self-pay | Admitting: *Deleted

## 2015-12-20 ENCOUNTER — Ambulatory Visit (HOSPITAL_BASED_OUTPATIENT_CLINIC_OR_DEPARTMENT_OTHER): Payer: Medicare Other | Admitting: Internal Medicine

## 2015-12-20 ENCOUNTER — Encounter: Payer: Self-pay | Admitting: Internal Medicine

## 2015-12-20 VITALS — BP 131/57 | HR 80 | Temp 98.3°F | Resp 18 | Ht 66.0 in | Wt 215.9 lb

## 2015-12-20 DIAGNOSIS — C3412 Malignant neoplasm of upper lobe, left bronchus or lung: Secondary | ICD-10-CM | POA: Diagnosis not present

## 2015-12-20 DIAGNOSIS — R197 Diarrhea, unspecified: Secondary | ICD-10-CM | POA: Diagnosis not present

## 2015-12-20 DIAGNOSIS — L03031 Cellulitis of right toe: Secondary | ICD-10-CM

## 2015-12-20 DIAGNOSIS — L03032 Cellulitis of left toe: Secondary | ICD-10-CM | POA: Diagnosis not present

## 2015-12-20 DIAGNOSIS — R21 Rash and other nonspecific skin eruption: Secondary | ICD-10-CM | POA: Diagnosis not present

## 2015-12-20 DIAGNOSIS — Z5111 Encounter for antineoplastic chemotherapy: Secondary | ICD-10-CM

## 2015-12-20 NOTE — Progress Notes (Signed)
[place] [phone]   Fax:(336) [phone]  OFFICE PROGRESS NOTE  DIAGNOSIS: Stage IV (T1b, N3, M1b) non-small cell lung cancer, adenocarcinoma with positive EGFR mutation in exon 21 (L858R) and recently found to have T790M resistant mutation, presented with left upper lobe lung mass in addition to left hilar and bilateral mediastinal lymphadenopathy as well as liver metastasis diagnosed in May of 2015.  PRIOR THERAPY:  1) Tarceva 150 mg by mouth daily. Started 11/25/2013. Status post 5 months of treatment discontinued today secondary to intolerance with persistent paronychia. 2) Tarceva 100 mg by mouth daily. Started 06/03/2014. Status post 8 months of treatment.   CURRENT THERAPY: Tagrisso 80 mg by mouth daily. First dose started 03/04/2015. Status post 9 months of treatment.  INTERVAL HISTORY: Debra Hodges 65 y.o. female returns to the clinic today for followup visit. The patient is feeling fine today with no specific complaints. She is tolerating her treatment with Tagrisso 80 mg by mouth daily fairly well with no significant adverse effects. She had a recent fall but no significant trauma. She denied having any significant skin rash or diarrhea. She denied having any significant chest pain, shortness of breath, cough or hemoptysis. She denied having any significant weight loss or night sweats. She has no nausea or vomiting. She has significant improvement of the paronychia of the toes. She had repeat CBC and comprehensive metabolic panel as well as CT scan of the chest, abdomen and pelvis performed recently and she is here for evaluation and discussion of her lab and his scan results.  MEDICAL HISTORY: Past Medical History:  Diagnosis Date  . High blood pressure   . Non-small cell carcinoma of lung, stage 4 ([place]) dx'd 10/2013    ALLERGIES:  is allergic to penicillins.  MEDICATIONS:  Current Outpatient Prescriptions  Medication Sig Dispense Refill    . acetaminophen (TYLENOL) 325 MG tablet Take 650 mg by mouth every 6 (six) hours as needed. Reported on 06/15/2015    . calcium carbonate (OS-CAL) 600 MG TABS tablet Take 600 mg by mouth daily.     Marland Kitchen CLIMARA 0.1 MG/24HR patch 1 patch once a week.    . levothyroxine (SYNTHROID, LEVOTHROID) 25 MCG tablet Take 25 mcg by mouth daily before breakfast.    . loperamide (IMODIUM) 2 MG capsule Take by mouth as needed for diarrhea or loose stools. Reported on 11/22/2015    . metroNIDAZOLE (METROCREAM) 0.75 % cream Apply 1 application topically daily. Reported on 11/22/2015    . Multiple Vitamins-Calcium (ONE-A-DAY WOMENS PO) Take 1 tablet by mouth daily.    Marland Kitchen osimertinib mesylate (TAGRISSO) 80 MG tablet Take 1 tablet (80 mg total) by mouth daily. 30 tablet 0  . potassium chloride SA (K-DUR,KLOR-CON) 20 MEQ tablet 1 tablet 2 (two) times daily.    Marland Kitchen spironolactone (ALDACTONE) 25 MG tablet 1 tablet daily.    . vitamin C (ASCORBIC ACID) 500 MG tablet Take 500 mg by mouth daily.     No current facility-administered medications for this visit.     SURGICAL HISTORY:  Past Surgical History:  Procedure Laterality Date  . ABDOMINAL HYSTERECTOMY    . BREAST BIOPSY     Right breast  . VIDEO BRONCHOSCOPY Bilateral 10/27/2013   Procedure: VIDEO BRONCHOSCOPY WITH FLUORO;  Surgeon: Collene Gobble, MD;  Location: [facility];  Service: Cardiopulmonary;  Laterality: Bilateral;    REVIEW OF SYSTEMS:  Constitutional: negative Eyes: negative Ears, nose, mouth, throat, and face: negative Respiratory:  negative Cardiovascular: negative Gastrointestinal: negative Genitourinary:negative Integument/breast: negative Hematologic/lymphatic: negative Musculoskeletal:negative Neurological: negative Behavioral/Psych: negative Endocrine: negative Allergic/Immunologic: negative   PHYSICAL EXAMINATION: General appearance: alert, cooperative and no distress Head: Normocephalic, without obvious abnormality, atraumatic Neck:  no adenopathy, no JVD, supple, symmetrical, trachea midline and thyroid not enlarged, symmetric, no tenderness/mass/nodules Lymph nodes: Cervical, supraclavicular, and axillary nodes normal. Resp: clear to auscultation bilaterally Back: symmetric, no curvature. ROM normal. No CVA tenderness. Cardio: regular rate and rhythm, S1, S2 normal, no murmur, click, rub or gallop GI: soft, non-tender; bowel sounds normal; no masses,  no organomegaly Extremities: extremities normal, atraumatic, no cyanosis or edema and With paronychia involving the left big toe. Neurologic: Alert and oriented X 3, normal strength and tone. Normal symmetric reflexes. Normal coordination and gait   ECOG PERFORMANCE STATUS: 1 - Symptomatic but completely ambulatory  Blood pressure (!) 131/57, pulse 80, temperature 98.3 F (36.8 C), temperature source Oral, resp. rate 18, height _0  (1.676 m), weight 97.9 kg (215 lb 14.4 oz), SpO2 100 %.  LABORATORY DATA: Lab Results  Component Value Date   WBC 9.0 12/13/2015   HGB 12.6 12/13/2015   HCT 37.7 12/13/2015   MCV 86.2 12/13/2015   PLT 202 12/13/2015      Chemistry      Component Value Date/Time   NA 139 12/13/2015 0803   K 4.1 12/13/2015 0803   CO2 25 12/13/2015 0803   BUN 13.0 12/13/2015 0803   CREATININE 0.9 12/13/2015 0803      Component Value Date/Time   CALCIUM 9.6 12/13/2015 0803   ALKPHOS 78 12/13/2015 0803   AST 21 12/13/2015 0803   ALT 20 12/13/2015 0803   BILITOT 0.55 12/13/2015 0803       RADIOGRAPHIC STUDIES: Ct Chest W Contrast  Result Date: 12/13/2015 CLINICAL DATA:  Patient with history of lung cancer, restaging evaluation. On chemotherapy. EXAM: CT CHEST, ABDOMEN AND PELVIS WITHOUT CONTRAST TECHNIQUE: Multidetector CT imaging of the chest, abdomen and pelvis was performed following the standard protocol without IV contrast. COMPARISON:  CT CAP 09/17/2015 FINDINGS: CT CHEST FINDINGS Mediastinum/Lymph Nodes: Unchanged 13 mm low-attenuation  lesion right lobe of the thyroid. No enlarged axillary, mediastinal or hilar lymphadenopathy. Normal heart size. No pericardial effusion. Aorta and main pulmonary artery normal in caliber. Lungs/Pleura: Central airways are patent. Unchanged focal area of consolidation within the lingula measuring approximately 3.5 x 1.9 cm, previously 3.8 x 1.9 cm (image 57; series 4). No new or enlarging pulmonary nodule or mass. Minimal dependent atelectasis within the bilateral lower lobes. No pleural effusion. Musculoskeletal: Thoracic spine degenerative changes. No aggressive or acute appearing osseous lesions. CT ABDOMEN PELVIS FINDINGS Hepatobiliary: Previously described lesion within the hepatic dome is not well demonstrated on current evaluation. No new or enlarging pulmonary masses. Gallbladder is unremarkable. No intrahepatic or extrahepatic biliary ductal dilatation. Pancreas: Unremarkable Spleen: Unchanged 8 mm low-attenuation lesion within the spleen. Adrenals/Urinary Tract: The adrenal glands are normal. Kidneys enhance symmetrically with contrast. No enhancing renal masses identified. 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 Unchanged 4 mm stone inferior pole left kidney. Urinary bladder is unremarkable. Stomach/Bowel: No abnormal bowel wall thickening or evidence for bowel obstruction. No free fluid or free intraperitoneal air. Vascular/Lymphatic: Normal caliber abdominal aorta. Peripheral calcified atherosclerotic plaque. No retroperitoneal lymphadenopathy. Reproductive: Status post hysterectomy. Other: None Musculoskeletal: Lumbar spine degenerative changes. No aggressive or acute appearing osseous lesions. IMPRESSION: Stable posttreatment changes within the lingula. Previously described low-attenuation  lesion within the hepatic dome is not well visualized on current examination. Nonobstructing left-sided nephrolithiasis. Electronically Signed   By: Lovey Newcomer M.D.   On: 12/13/2015 09:58   Ct Abdomen Pelvis W Contrast  Result Date: 12/13/2015 CLINICAL DATA:  Patient with history of lung cancer, restaging evaluation. On chemotherapy. EXAM: CT CHEST, ABDOMEN AND PELVIS WITHOUT CONTRAST TECHNIQUE: Multidetector CT imaging of the chest, abdomen and pelvis was performed following the standard protocol without IV contrast. COMPARISON:  CT CAP 09/17/2015 FINDINGS: CT CHEST FINDINGS Mediastinum/Lymph Nodes: Unchanged 13 mm low-attenuation lesion right lobe of the thyroid. No enlarged axillary, mediastinal or hilar lymphadenopathy. Normal heart size. No pericardial effusion. Aorta and main pulmonary artery normal in caliber. Lungs/Pleura: Central airways are patent. Unchanged focal area of consolidation within the lingula measuring approximately 3.5 x 1.9 cm, previously 3.8 x 1.9 cm (image 57; series 4). No new or enlarging pulmonary nodule or mass. Minimal dependent atelectasis within the bilateral lower lobes. No pleural effusion. Musculoskeletal: Thoracic spine degenerative changes. No aggressive or acute appearing osseous lesions. CT ABDOMEN PELVIS FINDINGS Hepatobiliary: Previously described lesion within the hepatic dome is not well demonstrated on current evaluation. No new or enlarging pulmonary masses. Gallbladder is unremarkable. No intrahepatic or extrahepatic biliary ductal dilatation. Pancreas: Unremarkable Spleen: Unchanged 8 mm low-attenuation lesion within the spleen. Adrenals/Urinary Tract: The adrenal glands are normal. Kidneys enhance symmetrically with contrast. No enhancing renal masses identified. 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 Unchanged 4 mm stone inferior pole left kidney. Urinary bladder is unremarkable. Stomach/Bowel: No abnormal bowel wall thickening or evidence for bowel obstruction. No free fluid or free intraperitoneal air. Vascular/Lymphatic: Normal caliber abdominal  aorta. Peripheral calcified atherosclerotic plaque. No retroperitoneal lymphadenopathy. Reproductive: Status post hysterectomy. Other: None Musculoskeletal: Lumbar spine degenerative changes. No aggressive or acute appearing osseous lesions. IMPRESSION: Stable posttreatment changes within the lingula. Previously described low-attenuation lesion within the hepatic dome is not well visualized on current examination. Nonobstructing left-sided nephrolithiasis. Electronically Signed   By: Lovey Newcomer M.D.   On: 12/13/2015 09:58   ASSESSMENT AND PLAN: This is a very pleasant 65 years old white female recently diagnosed with stage IV non-small cell lung cancer, adenocarcinoma with positive EGFR mutation in exon 21 (L858R) and recently developed T790M resistant mutation. She was treated with Tarceva 150 mg by mouth daily status post 5 months of treatment and tolerating it fairly well except for the skin rash and occasional diarrhea as well as paronychia of the toes.  She completed treatment with Tarceva 100 mg by mouth daily for the last 8 months. This was discontinued secondary to disease progression. The recent molecular studies showed evidence for T790M resistant mutation. The patient was started on treatment with Tagrisso 80 mg by mouth daily on 03/04/2015, Status post 9 months of treatment and has been tolerating her treatment fairly well with no significant adverse effects. The recent CT scan of the chest, abdomen and pelvis showed no evidence for disease progression but further improvement in the liver lesion. I recommended for the patient to continue her current treatment with Tagrisso. I will see her back for follow-up visit in one month for reevaluation with repeat blood work.  She was advised to call immediately if she has any concerning symptoms in the interval. The patient voices understanding of current disease status and treatment options and is in agreement with the current care plan.  All  questions were answered. The patient knows  to call the clinic with any problems, questions or concerns. We can certainly see the patient much sooner if necessary.  Disclaimer: This note was dictated with voice recognition software. Similar sounding words can inadvertently be transcribed and may not be corrected upon review.

## 2015-12-20 NOTE — Telephone Encounter (Signed)
Tagrisso shipped to pt.

## 2015-12-20 NOTE — Telephone Encounter (Signed)
Gave pt cal & avs °

## 2016-01-04 MED FILL — LEVOTHYROXINE 25 MCG TABLET: 25 | 90 days supply | Qty: 90 | Fill #3

## 2016-01-17 ENCOUNTER — Telehealth: Payer: Self-pay | Admitting: Internal Medicine

## 2016-01-17 ENCOUNTER — Encounter: Payer: Self-pay | Admitting: Internal Medicine

## 2016-01-17 ENCOUNTER — Ambulatory Visit (HOSPITAL_BASED_OUTPATIENT_CLINIC_OR_DEPARTMENT_OTHER): Payer: Medicare Other | Admitting: Internal Medicine

## 2016-01-17 ENCOUNTER — Other Ambulatory Visit (HOSPITAL_BASED_OUTPATIENT_CLINIC_OR_DEPARTMENT_OTHER): Payer: Medicare Other

## 2016-01-17 VITALS — BP 135/58 | HR 78 | Temp 98.0°F | Resp 18 | Ht 68.0 in | Wt 216.0 lb

## 2016-01-17 DIAGNOSIS — C3412 Malignant neoplasm of upper lobe, left bronchus or lung: Secondary | ICD-10-CM | POA: Diagnosis not present

## 2016-01-17 DIAGNOSIS — Z5111 Encounter for antineoplastic chemotherapy: Secondary | ICD-10-CM

## 2016-01-17 LAB — COMPREHENSIVE METABOLIC PANEL
ALT: 18 U/L (ref 0–55)
AST: 21 U/L (ref 5–34)
Albumin: 3.7 g/dL (ref 3.5–5.0)
Alkaline Phosphatase: 78 U/L (ref 40–150)
Anion Gap: 9 mEq/L (ref 3–11)
BUN: 15.8 mg/dL (ref 7.0–26.0)
CO2: 24 mEq/L (ref 22–29)
Calcium: 9.5 mg/dL (ref 8.4–10.4)
Chloride: 107 mEq/L (ref 98–109)
Creatinine: 0.9 mg/dL (ref 0.6–1.1)
EGFR: 67 mL/min/{1.73_m2} — ABNORMAL LOW (ref >90)
Glucose: 97 mg/dl (ref 70–140)
Potassium: 3.9 mEq/L (ref 3.5–5.1)
Sodium: 140 mEq/L (ref 136–145)
Total Bilirubin: 0.43 mg/dL (ref 0.20–1.20)
Total Protein: 7.6 g/dL (ref 6.4–8.3)

## 2016-01-17 LAB — CBC WITH DIFFERENTIAL/PLATELET
BASO%: 0 % (ref 0.0–2.0)
BASOS ABS: 0 10*3/uL (ref 0.0–0.1)
EOS ABS: 0.2 10*3/uL (ref 0.0–0.5)
EOS%: 2 % (ref 0.0–7.0)
HEMATOCRIT: 36.4 % (ref 34.8–46.6)
HEMOGLOBIN: 12.1 g/dL (ref 11.6–15.9)
LYMPH#: 2 10*3/uL (ref 0.9–3.3)
LYMPH%: 22.3 % (ref 14.0–49.7)
MCH: 29 pg (ref 25.1–34.0)
MCHC: 33.2 g/dL (ref 31.5–36.0)
MCV: 87.3 fL (ref 79.5–101.0)
MONO#: 0.6 10*3/uL (ref 0.1–0.9)
MONO%: 6.2 % (ref 0.0–14.0)
NEUT%: 69.5 % (ref 38.4–76.8)
NEUTROS ABS: 6.2 10*3/uL (ref 1.5–6.5)
Platelets: 175 10*3/uL (ref 145–400)
RBC: 4.17 10*6/uL (ref 3.70–5.45)
RDW: 14.2 % (ref 11.2–14.5)
WBC: 8.9 10*3/uL (ref 3.9–10.3)

## 2016-01-17 NOTE — Progress Notes (Signed)
[name] Telephone:(336) [phone]   Fax:(336) [phone]  OFFICE PROGRESS NOTE  DIAGNOSIS: Stage IV (T1b, N3, M1b) non-small cell lung cancer, adenocarcinoma with positive EGFR mutation in exon 21 (L858R) and recently found to have T790M resistant mutation, presented with left upper lobe lung mass in addition to left hilar and bilateral mediastinal lymphadenopathy as well as liver metastasis diagnosed in May of 2015.  PRIOR THERAPY:  1) Tarceva 150 mg by mouth daily. Started 11/25/2013. Status post 5 months of treatment discontinued today secondary to intolerance with persistent paronychia. 2) Tarceva 100 mg by mouth daily. Started 06/03/2014. Status post 8 months of treatment.   CURRENT THERAPY: Tagrisso 80 mg by mouth daily. First dose started 03/04/2015. Status post 9 months of treatment.  INTERVAL HISTORY: Debra Hodges 65 y.o. female returns to the clinic today for followup visit accompanied by her husband. The patient is feeling fine today with no specific complaints. She is tolerating her treatment with Tagrisso 80 mg by mouth daily fairly well with no significant adverse effects. She denied having any significant skin rash or diarrhea. She denied having any significant chest pain, shortness of breath, cough or hemoptysis. She denied having any significant weight loss or night sweats. She has no nausea or vomiting. She had repeat CBC and comprehensive metabolic panel performed earlier today and she is here for evaluation and discussion of her lab results.  MEDICAL HISTORY: Past Medical History:  Diagnosis Date  . High blood pressure   . Non-small cell carcinoma of lung, stage 4 ([place]) dx'd 10/2013    ALLERGIES:  is allergic to penicillins.  MEDICATIONS:  Current Outpatient Prescriptions  Medication Sig Dispense Refill  . calcium carbonate (OS-CAL) 600 MG TABS tablet Take 600 mg by mouth daily.     Marland Kitchen CLIMARA 0.1 MG/24HR patch 1 patch once a week.    .  levothyroxine (SYNTHROID, LEVOTHROID) 25 MCG tablet Take 25 mcg by mouth daily before breakfast.    . metroNIDAZOLE (METROCREAM) 0.75 % cream Apply 1 application topically daily. Reported on 11/22/2015    . Multiple Vitamins-Calcium (ONE-A-DAY WOMENS PO) Take 1 tablet by mouth daily.    Marland Kitchen osimertinib mesylate (TAGRISSO) 80 MG tablet Take 1 tablet (80 mg total) by mouth daily. 30 tablet 0  . potassium chloride SA (K-DUR,KLOR-CON) 20 MEQ tablet 1 tablet 2 (two) times daily.    . Sodium Fluoride (CLINPRO 5000) 1.1 % PSTE Place 1 application onto teeth 3 (three) times daily.    Marland Kitchen spironolactone (ALDACTONE) 25 MG tablet 1 tablet daily.    . vitamin C (ASCORBIC ACID) 500 MG tablet Take 500 mg by mouth daily.    Marland Kitchen acetaminophen (TYLENOL) 325 MG tablet Take 650 mg by mouth every 6 (six) hours as needed. Reported on 06/15/2015    . loperamide (IMODIUM) 2 MG capsule Take by mouth as needed for diarrhea or loose stools. Reported on 11/22/2015     No current facility-administered medications for this visit.     SURGICAL HISTORY:  Past Surgical History:  Procedure Laterality Date  . ABDOMINAL HYSTERECTOMY    . BREAST BIOPSY     Right breast  . VIDEO BRONCHOSCOPY Bilateral 10/27/2013   Procedure: VIDEO BRONCHOSCOPY WITH FLUORO;  Surgeon: Collene Gobble, MD;  Location: [facility];  Service: Cardiopulmonary;  Laterality: Bilateral;    REVIEW OF SYSTEMS:  A comprehensive review of systems was negative.   PHYSICAL EXAMINATION: General appearance: alert, cooperative and no distress Head: Normocephalic, without  obvious abnormality, atraumatic Neck: no adenopathy, no JVD, supple, symmetrical, trachea midline and thyroid not enlarged, symmetric, no tenderness/mass/nodules Lymph nodes: Cervical, supraclavicular, and axillary nodes normal. Resp: clear to auscultation bilaterally Back: symmetric, no curvature. ROM normal. No CVA tenderness. Cardio: regular rate and rhythm, S1, S2 normal, no murmur, click, rub or  gallop GI: soft, non-tender; bowel sounds normal; no masses,  no organomegaly Extremities: extremities normal, atraumatic, no cyanosis or edema and With paronychia involving the left big toe. Neurologic: Alert and oriented X 3, normal strength and tone. Normal symmetric reflexes. Normal coordination and gait   ECOG PERFORMANCE STATUS: 1 - Symptomatic but completely ambulatory  Blood pressure (!) 135/58, pulse 78, temperature 98 F (36.7 C), temperature source Oral, resp. rate 18, height _0  (1.727 m), weight 216 lb (98 kg), SpO2 99 %.  LABORATORY DATA: Lab Results  Component Value Date   WBC 8.9 01/17/2016   HGB 12.1 01/17/2016   HCT 36.4 01/17/2016   MCV 87.3 01/17/2016   PLT 175 01/17/2016      Chemistry      Component Value Date/Time   NA 139 12/13/2015 0803   K 4.1 12/13/2015 0803   CO2 25 12/13/2015 0803   BUN 13.0 12/13/2015 0803   CREATININE 0.9 12/13/2015 0803      Component Value Date/Time   CALCIUM 9.6 12/13/2015 0803   ALKPHOS 78 12/13/2015 0803   AST 21 12/13/2015 0803   ALT 20 12/13/2015 0803   BILITOT 0.55 12/13/2015 0803       RADIOGRAPHIC STUDIES: No results found. ASSESSMENT AND PLAN: This is a very pleasant 65 years old white female recently diagnosed with stage IV non-small cell lung cancer, adenocarcinoma with positive EGFR mutation in exon 21 (L858R) and recently developed T790M resistant mutation. She was treated with Tarceva 150 mg by mouth daily status post 5 months of treatment and tolerating it fairly well except for the skin rash and occasional diarrhea as well as paronychia of the toes.  She completed treatment with Tarceva 100 mg by mouth daily for the last 8 months. This was discontinued secondary to disease progression. The recent molecular studies showed evidence for T790M resistant mutation. The patient was started on treatment with Tagrisso 80 mg by mouth daily on 03/04/2015, Status post 10 months of treatment and has been tolerating  her treatment fairly well with no significant adverse effects. CBC today is unremarkable. Comprehensive metabolic panel is still pending. I recommended for the patient to continue her current treatment with Tagrisso. I will see her back for follow-up visit in one month for reevaluation with repeat blood work.  She was advised to call immediately if she has any concerning symptoms in the interval. The patient voices understanding of current disease status and treatment options and is in agreement with the current care plan.  All questions were answered. The patient knows to call the clinic with any problems, questions or concerns. We can certainly see the patient much sooner if necessary.  Disclaimer: This note was dictated with voice recognition software. Similar sounding words can inadvertently be transcribed and may not be corrected upon review.

## 2016-01-17 NOTE — Telephone Encounter (Signed)
GAVE PATIENT AVS REPORT AND APPOINTMENTS FOR September.  °

## 2016-01-21 ENCOUNTER — Other Ambulatory Visit: Payer: Self-pay | Admitting: *Deleted

## 2016-01-21 DIAGNOSIS — C3412 Malignant neoplasm of upper lobe, left bronchus or lung: Secondary | ICD-10-CM

## 2016-01-21 MED ORDER — OSIMERTINIB MESYLATE 80 MG PO TABS: 80.0000 mg | ORAL_TABLET | Freq: Every day | ORAL | 0 refills | Status: DC

## 2016-01-21 NOTE — Telephone Encounter (Signed)
Tagrisso Rx refill faxed to [facility]

## 2016-01-25 ENCOUNTER — Other Ambulatory Visit: Payer: Self-pay | Admitting: *Deleted

## 2016-01-25 DIAGNOSIS — C3412 Malignant neoplasm of upper lobe, left bronchus or lung: Secondary | ICD-10-CM

## 2016-01-25 MED ORDER — OSIMERTINIB MESYLATE 80 MG PO TABS: 80.0000 mg | ORAL_TABLET | Freq: Every day | ORAL | 2 refills | Status: DC

## 2016-01-26 ENCOUNTER — Telehealth: Payer: Self-pay | Admitting: Medical Oncology

## 2016-01-26 NOTE — Telephone Encounter (Signed)
Need new rx to verify quantity. Faxed new rx for #30 with 11 refills.

## 2016-01-26 NOTE — Telephone Encounter (Signed)
Need new rx because quantity not stated.

## 2016-02-03 ENCOUNTER — Telehealth: Payer: Self-pay | Admitting: Pharmacist

## 2016-02-03 NOTE — Telephone Encounter (Signed)
Received fax notification from [facility] that Debra Hodges is approved until 08/02/2016.   Patient receives her drug through [employer] program.  [name] ships her [place]. Refill rx faxed per [name], RN on 01/26/16.  [name], PharmD, BCPS, [facility] [phone]

## 2016-02-14 ENCOUNTER — Telehealth: Payer: Self-pay | Admitting: Internal Medicine

## 2016-02-14 ENCOUNTER — Other Ambulatory Visit (HOSPITAL_BASED_OUTPATIENT_CLINIC_OR_DEPARTMENT_OTHER): Payer: Medicare Other

## 2016-02-14 ENCOUNTER — Ambulatory Visit (HOSPITAL_BASED_OUTPATIENT_CLINIC_OR_DEPARTMENT_OTHER): Payer: Medicare Other | Admitting: Internal Medicine

## 2016-02-14 ENCOUNTER — Encounter: Payer: Self-pay | Admitting: Internal Medicine

## 2016-02-14 VITALS — BP 143/43 | HR 77 | Temp 97.9°F | Resp 18 | Ht 68.0 in | Wt 216.1 lb

## 2016-02-14 DIAGNOSIS — C3412 Malignant neoplasm of upper lobe, left bronchus or lung: Secondary | ICD-10-CM | POA: Diagnosis not present

## 2016-02-14 DIAGNOSIS — C787 Secondary malignant neoplasm of liver and intrahepatic bile duct: Secondary | ICD-10-CM

## 2016-02-14 DIAGNOSIS — R05 Cough: Secondary | ICD-10-CM | POA: Diagnosis not present

## 2016-02-14 DIAGNOSIS — Z5111 Encounter for antineoplastic chemotherapy: Secondary | ICD-10-CM

## 2016-02-14 LAB — COMPREHENSIVE METABOLIC PANEL
ALT: 21 U/L (ref 0–55)
ANION GAP: 9 meq/L (ref 3–11)
AST: 23 U/L (ref 5–34)
Albumin: 3.6 g/dL (ref 3.5–5.0)
Alkaline Phosphatase: 81 U/L (ref 40–150)
BUN: 18.5 mg/dL (ref 7.0–26.0)
CALCIUM: 9.4 mg/dL (ref 8.4–10.4)
CHLORIDE: 108 meq/L (ref 98–109)
CO2: 24 meq/L (ref 22–29)
Creatinine: 0.9 mg/dL (ref 0.6–1.1)
EGFR: 71 mL/min/{1.73_m2} — AB (ref >90)
Glucose: 88 mg/dl (ref 70–140)
POTASSIUM: 4.4 meq/L (ref 3.5–5.1)
Sodium: 141 mEq/L (ref 136–145)
Total Bilirubin: 0.41 mg/dL (ref 0.20–1.20)
Total Protein: 7.7 g/dL (ref 6.4–8.3)

## 2016-02-14 LAB — CBC WITH DIFFERENTIAL/PLATELET
BASO%: 0 % (ref 0.0–2.0)
Basophils Absolute: 0 10e3/uL (ref 0.0–0.1)
EOS%: 2.9 % (ref 0.0–7.0)
Eosinophils Absolute: 0.3 10e3/uL (ref 0.0–0.5)
HCT: 37.5 % (ref 34.8–46.6)
HGB: 12.2 g/dL (ref 11.6–15.9)
LYMPH%: 24 % (ref 14.0–49.7)
MCH: 28.6 pg (ref 25.1–34.0)
MCHC: 32.5 g/dL (ref 31.5–36.0)
MCV: 87.8 fL (ref 79.5–101.0)
MONO#: 0.6 10e3/uL (ref 0.1–0.9)
MONO%: 7.4 % (ref 0.0–14.0)
NEUT#: 5.6 10e3/uL (ref 1.5–6.5)
NEUT%: 65.7 % (ref 38.4–76.8)
Platelets: 191 10e3/uL (ref 145–400)
RBC: 4.27 10e6/uL (ref 3.70–5.45)
RDW: 14.1 % (ref 11.2–14.5)
WBC: 8.6 10e3/uL (ref 3.9–10.3)
lymph#: 2.1 10e3/uL (ref 0.9–3.3)

## 2016-02-14 NOTE — Telephone Encounter (Signed)
Avs report and appointment schedule given to patient, per 02/14/16 los. °

## 2016-02-14 NOTE — Progress Notes (Signed)
[place] [phone]   [phone]  OFFICE PROGRESS NOTE  DIAGNOSIS: Stage IV (T1b, N3, M1b) non-small cell lung cancer, adenocarcinoma with positive EGFR mutation in exon 21 (L858R) and recently found to have T790M resistant mutation, presented with left upper lobe lung mass in addition to left hilar and bilateral mediastinal lymphadenopathy as well as liver metastasis diagnosed in May of 2015.  PRIOR THERAPY:  1) Tarceva 150 mg by mouth daily. Started 11/25/2013. Status post 5 months of treatment discontinued today secondary to intolerance with persistent paronychia. 2) Tarceva 100 mg by mouth daily. Started 06/03/2014. Status post 8 months of treatment.   CURRENT THERAPY: Tagrisso 80 mg by mouth daily. First dose started 03/04/2015. Status post 11 months of treatment.  INTERVAL HISTORY: Debra Hodges 65 y.o. female returns to the clinic today for followup visit accompanied by her husband. The patient is feeling fine today with no specific complaints. She is tolerating her treatment with Tagrisso 80 mg by mouth daily fairly well with no significant adverse effects except for mild cough. She denied having any significant skin rash or diarrhea. She denied having any significant chest pain, shortness of breath or hemoptysis. She denied having any significant weight loss or night sweats. She has no nausea or vomiting. She had repeat CBC and comprehensive metabolic panel performed earlier today and she is here for evaluation and discussion of her lab results.  MEDICAL HISTORY: Past Medical History:  Diagnosis Date  . High blood pressure   . Non-small cell carcinoma of lung, stage 4 ([place]) dx'd 10/2013    ALLERGIES:  is allergic to penicillins.  MEDICATIONS:  Current Outpatient Prescriptions  Medication Sig Dispense Refill  . acetaminophen (TYLENOL) 325 MG tablet Take 650 mg by mouth every 6 (six) hours as needed. Reported on 06/15/2015    . calcium  carbonate (OS-CAL) 600 MG TABS tablet Take 600 mg by mouth daily.     Marland Kitchen CLIMARA 0.1 MG/24HR patch 1 patch once a week.    . levothyroxine (SYNTHROID, LEVOTHROID) 25 MCG tablet Take 25 mcg by mouth daily before breakfast.    . loperamide (IMODIUM) 2 MG capsule Take by mouth as needed for diarrhea or loose stools. Reported on 11/22/2015    . metroNIDAZOLE (METROCREAM) 0.75 % cream Apply 1 application topically daily. Reported on 11/22/2015    . Multiple Vitamins-Calcium (ONE-A-DAY WOMENS PO) Take 1 tablet by mouth daily.    Marland Kitchen osimertinib mesylate (TAGRISSO) 80 MG tablet Take 1 tablet (80 mg total) by mouth daily. rx faxed to [facility] on 8/29 with 2 refills 30 tablet 2  . potassium chloride SA (K-DUR,KLOR-CON) 20 MEQ tablet 1 tablet 2 (two) times daily.    . Sodium Fluoride (CLINPRO 5000) 1.1 % PSTE Place 1 application onto teeth 3 (three) times daily.    Marland Kitchen spironolactone (ALDACTONE) 25 MG tablet 1 tablet daily.    . vitamin C (ASCORBIC ACID) 500 MG tablet Take 500 mg by mouth daily.     No current facility-administered medications for this visit.     SURGICAL HISTORY:  Past Surgical History:  Procedure Laterality Date  . ABDOMINAL HYSTERECTOMY    . BREAST BIOPSY     Right breast  . VIDEO BRONCHOSCOPY Bilateral 10/27/2013   Procedure: VIDEO BRONCHOSCOPY WITH FLUORO;  Surgeon: Collene Gobble, MD;  Location: [facility];  Service: Cardiopulmonary;  Laterality: Bilateral;    REVIEW OF SYSTEMS:  A comprehensive review of systems was negative except for:  Respiratory: positive for cough   PHYSICAL EXAMINATION: General appearance: alert, cooperative and no distress Head: Normocephalic, without obvious abnormality, atraumatic Neck: no adenopathy, no JVD, supple, symmetrical, trachea midline and thyroid not enlarged, symmetric, no tenderness/mass/nodules Lymph nodes: Cervical, supraclavicular, and axillary nodes normal. Resp: clear to auscultation bilaterally Back: symmetric, no curvature. ROM normal.  No CVA tenderness. Cardio: regular rate and rhythm, S1, S2 normal, no murmur, click, rub or gallop GI: soft, non-tender; bowel sounds normal; no masses,  no organomegaly Extremities: extremities normal, atraumatic, no cyanosis or edema and With paronychia involving the left big toe. Neurologic: Alert and oriented X 3, normal strength and tone. Normal symmetric reflexes. Normal coordination and gait   ECOG PERFORMANCE STATUS: 1 - Symptomatic but completely ambulatory  Blood pressure (!) 143/43, pulse 77, temperature 97.9 F (36.6 C), temperature source Oral, resp. rate 18, height '5\' 8"'$  (1.727 m), weight 216 lb 1.6 oz (98 kg), SpO2 100 %.  LABORATORY DATA: Lab Results  Component Value Date   WBC 8.6 02/14/2016   HGB 12.2 02/14/2016   HCT 37.5 02/14/2016   MCV 87.8 02/14/2016   PLT 191 02/14/2016      Chemistry      Component Value Date/Time   NA 140 01/17/2016 0831   K 3.9 01/17/2016 0831   CO2 24 01/17/2016 0831   BUN 15.8 01/17/2016 0831   CREATININE 0.9 01/17/2016 0831      Component Value Date/Time   CALCIUM 9.5 01/17/2016 0831   ALKPHOS 78 01/17/2016 0831   AST 21 01/17/2016 0831   ALT 18 01/17/2016 0831   BILITOT 0.43 01/17/2016 0831       RADIOGRAPHIC STUDIES: No results found. ASSESSMENT AND PLAN: This is a very pleasant 65 years old white female recently diagnosed with stage IV non-small cell lung cancer, adenocarcinoma with positive EGFR mutation in exon 21 (L858R) and recently developed T790M resistant mutation. She was treated with Tarceva 150 mg by mouth daily status post 5 months of treatment and tolerating it fairly well except for the skin rash and occasional diarrhea as well as paronychia of the toes.  She completed treatment with Tarceva 100 mg by mouth daily for the last 8 months. This was discontinued secondary to disease progression. The recent molecular studies showed evidence for T790M resistant mutation. The patient was started on treatment with  Tagrisso 80 mg by mouth daily on 03/04/2015, Status post 11 months of treatment and has been tolerating her treatment fairly well with no significant adverse effects except for mild cough. CBC today is unremarkable. Comprehensive metabolic panel is still pending. I recommended for the patient to continue her current treatment with Tagrisso. I will see her back for follow-up visit in one month for reevaluation with repeat blood work as well as CT scan of the chest, abdomen and pelvis for restaging of her disease.  She was advised to call immediately if she has any concerning symptoms in the interval. The patient voices understanding of current disease status and treatment options and is in agreement with the current care plan.  All questions were answered. The patient knows to call the clinic with any problems, questions or concerns. We can certainly see the patient much sooner if necessary.  Disclaimer: This note was dictated with voice recognition software. Similar sounding words can inadvertently be transcribed and may not be corrected upon review.

## 2016-02-16 ENCOUNTER — Telehealth: Payer: Self-pay | Admitting: Pharmacist

## 2016-02-16 NOTE — Telephone Encounter (Signed)
Oral Chemotherapy Pharmacist Encounter   I received call from [facility] inquiring about refill for patient's Tagrisso. Per Dr. Worthy Flank note 9/18, patient to continue on current dose of Tagrisso. Next fill initiated with [facility].  I called patient to inform her that medication should arrive in 3-5 business and she will have to sign for it.  Pt expressed understanding and appreciation. Pt tolerating Tagrisso '80mg'$  daily without issue. No side effects to report or complaints with today's call. Pt has not missed any doses since last fill.  Oral Chemo Clinic will continue to follow.  [name], PharmD, BCPS 02/16/2016  2:25 PM Oral Chemotherapy Clinic [phone]

## 2016-03-08 ENCOUNTER — Telehealth: Payer: Self-pay | Admitting: Medical Oncology

## 2016-03-08 DIAGNOSIS — C3412 Malignant neoplasm of upper lobe, left bronchus or lung: Secondary | ICD-10-CM

## 2016-03-08 MED ORDER — OSIMERTINIB MESYLATE 80 MG PO TABS: 80.0000 mg | ORAL_TABLET | Freq: Every day | ORAL | 2 refills | Status: DC

## 2016-03-08 NOTE — Telephone Encounter (Signed)
tagrisso refilled called in

## 2016-03-10 ENCOUNTER — Encounter (HOSPITAL_COMMUNITY): Payer: Self-pay

## 2016-03-10 ENCOUNTER — Ambulatory Visit (HOSPITAL_COMMUNITY)
Admission: RE | Admit: 2016-03-10 | Discharge: 2016-03-10 | Disposition: A | Discharge status: Home / Self Care | Payer: Medicare Other | Source: Ambulatory Visit | Attending: Internal Medicine | Admitting: Internal Medicine

## 2016-03-10 ENCOUNTER — Other Ambulatory Visit (HOSPITAL_BASED_OUTPATIENT_CLINIC_OR_DEPARTMENT_OTHER): Payer: Medicare Other

## 2016-03-10 DIAGNOSIS — R911 Solitary pulmonary nodule: Secondary | ICD-10-CM | POA: Insufficient documentation

## 2016-03-10 DIAGNOSIS — C3412 Malignant neoplasm of upper lobe, left bronchus or lung: Secondary | ICD-10-CM | POA: Diagnosis present

## 2016-03-10 DIAGNOSIS — Z5111 Encounter for antineoplastic chemotherapy: Secondary | ICD-10-CM | POA: Insufficient documentation

## 2016-03-10 DIAGNOSIS — C3492 Malignant neoplasm of unspecified part of left bronchus or lung: Secondary | ICD-10-CM | POA: Diagnosis not present

## 2016-03-10 LAB — COMPREHENSIVE METABOLIC PANEL
ALT: 14 U/L (ref 0–55)
AST: 22 U/L (ref 5–34)
Albumin: 3.8 g/dL (ref 3.5–5.0)
Alkaline Phosphatase: 79 U/L (ref 40–150)
Anion Gap: 9 mEq/L (ref 3–11)
BILIRUBIN TOTAL: 0.46 mg/dL (ref 0.20–1.20)
BUN: 14.5 mg/dL (ref 7.0–26.0)
CHLORIDE: 105 meq/L (ref 98–109)
CO2: 26 meq/L (ref 22–29)
Calcium: 9.7 mg/dL (ref 8.4–10.4)
Creatinine: 0.9 mg/dL (ref 0.6–1.1)
EGFR: 67 mL/min/{1.73_m2} — AB (ref >90)
GLUCOSE: 89 mg/dL (ref 70–140)
Potassium: 4.6 mEq/L (ref 3.5–5.1)
SODIUM: 140 meq/L (ref 136–145)
TOTAL PROTEIN: 7.7 g/dL (ref 6.4–8.3)

## 2016-03-10 LAB — CBC WITH DIFFERENTIAL/PLATELET
BASO%: 0.1 % (ref 0.0–2.0)
Basophils Absolute: 0 10*3/uL (ref 0.0–0.1)
EOS%: 2 % (ref 0.0–7.0)
Eosinophils Absolute: 0.2 10*3/uL (ref 0.0–0.5)
HCT: 37.6 % (ref 34.8–46.6)
HGB: 12.6 g/dL (ref 11.6–15.9)
LYMPH%: 29.5 % (ref 14.0–49.7)
MCH: 28.9 pg (ref 25.1–34.0)
MCHC: 33.5 g/dL (ref 31.5–36.0)
MCV: 86.2 fL (ref 79.5–101.0)
MONO#: 0.5 10*3/uL (ref 0.1–0.9)
MONO%: 7.3 % (ref 0.0–14.0)
NEUT%: 61.1 % (ref 38.4–76.8)
NEUTROS ABS: 4.5 10*3/uL (ref 1.5–6.5)
Platelets: 175 10*3/uL (ref 145–400)
RBC: 4.36 10*6/uL (ref 3.70–5.45)
RDW: 13.9 % (ref 11.2–14.5)
WBC: 7.4 10*3/uL (ref 3.9–10.3)
lymph#: 2.2 10*3/uL (ref 0.9–3.3)

## 2016-03-10 MED ORDER — IOPAMIDOL (ISOVUE-300) INJECTION 61%
100.0000 mL | Freq: Once | INTRAVENOUS | Status: AC | PRN
  Administered 2016-03-10: 100 mL via INTRAVENOUS

## 2016-03-14 ENCOUNTER — Telehealth: Payer: Self-pay | Admitting: *Deleted

## 2016-03-14 NOTE — Telephone Encounter (Signed)
Fax received 03/13/16-Pt's Tagrisso shipped this date. Pt to receive within 1-2 days

## 2016-03-15 ENCOUNTER — Telehealth: Payer: Self-pay | Admitting: Internal Medicine

## 2016-03-15 ENCOUNTER — Ambulatory Visit (HOSPITAL_BASED_OUTPATIENT_CLINIC_OR_DEPARTMENT_OTHER): Payer: Medicare Other | Admitting: Internal Medicine

## 2016-03-15 ENCOUNTER — Encounter: Payer: Self-pay | Admitting: Internal Medicine

## 2016-03-15 VITALS — BP 142/59 | HR 78 | Temp 98.1°F | Resp 18 | Ht 68.0 in | Wt 217.5 lb

## 2016-03-15 DIAGNOSIS — C3412 Malignant neoplasm of upper lobe, left bronchus or lung: Secondary | ICD-10-CM | POA: Diagnosis not present

## 2016-03-15 DIAGNOSIS — Z5111 Encounter for antineoplastic chemotherapy: Secondary | ICD-10-CM

## 2016-03-15 NOTE — Telephone Encounter (Signed)
Avs report and appointment schedule given to patient per 03/15/16 los. °

## 2016-03-15 NOTE — Progress Notes (Signed)
[place] [phone]   Fax:([phone]) [phone]  OFFICE PROGRESS NOTE  DIAGNOSIS: Stage IV (T1b, N3, M1b) non-small cell lung cancer, adenocarcinoma with positive EGFR mutation in exon 21 (L858R) and recently found to have T790M resistant mutation, presented with left upper lobe lung mass in addition to left hilar and bilateral mediastinal lymphadenopathy as well as liver metastasis diagnosed in May of 2015.  PRIOR THERAPY:  1) Tarceva 150 mg by mouth daily. Started 11/25/2013. Status post 5 months of treatment discontinued today secondary to intolerance with persistent paronychia. 2) Tarceva 100 mg by mouth daily. Started 06/03/2014. Status post 8 months of treatment.   CURRENT THERAPY: Tagrisso 80 mg by mouth daily. First dose started 03/04/2015. Status post 12 months of treatment.  INTERVAL HISTORY: Debra Hodges 66 y.o. female returns to the clinic today for followup visit accompanied by her husband. The patient is feeling fine today with no specific complaints. She continues to tolerate her treatment with Tagrisso 80 mg by mouth daily fairly well with no significant adverse effects. She denied having any significant skin rash or diarrhea. She denied having any significant chest pain, shortness of breath or hemoptysis. She denied having any significant weight loss or night sweats. She has no nausea or vomiting. She had repeat CT scan of the chest, abdomen and pelvis performed recently and she is here for evaluation and discussion of her scan results.  MEDICAL HISTORY: Past Medical History:  Diagnosis Date  . High blood pressure   . Non-small cell carcinoma of lung, stage 4 ([place]) dx'd 10/2013    ALLERGIES:  is allergic to penicillins.  MEDICATIONS:  Current Outpatient Prescriptions  Medication Sig Dispense Refill  . acetaminophen (TYLENOL) 325 MG tablet Take 650 mg by mouth every 6 (six) hours as needed. Reported on 06/15/2015    . calcium carbonate  (OS-CAL) 600 MG TABS tablet Take 600 mg by mouth daily.     Marland Kitchen CLIMARA 0.1 MG/24HR patch 1 patch once a week.    . levothyroxine (SYNTHROID, LEVOTHROID) 25 MCG tablet Take 25 mcg by mouth daily before breakfast.    . loperamide (IMODIUM) 2 MG capsule Take by mouth as needed for diarrhea or loose stools. Reported on 11/22/2015    . metroNIDAZOLE (METROCREAM) 0.75 % cream Apply 1 application topically daily. Reported on 11/22/2015    . Multiple Vitamins-Calcium (ONE-A-DAY WOMENS PO) Take 1 tablet by mouth daily.    Marland Kitchen osimertinib mesylate (TAGRISSO) 80 MG tablet Take 1 tablet (80 mg total) by mouth daily. rx faxed to [facility] on 8/29 with 2 refills 30 tablet 2  . potassium chloride SA (K-DUR,KLOR-CON) 20 MEQ tablet 1 tablet 2 (two) times daily.    . Sodium Fluoride (CLINPRO 5000) 1.1 % PSTE Place 1 application onto teeth 3 (three) times daily.    Marland Kitchen spironolactone (ALDACTONE) 25 MG tablet 1 tablet daily.    . vitamin C (ASCORBIC ACID) 500 MG tablet Take 500 mg by mouth daily.     No current facility-administered medications for this visit.     SURGICAL HISTORY:  Past Surgical History:  Procedure Laterality Date  . ABDOMINAL HYSTERECTOMY    . BREAST BIOPSY     Right breast  . VIDEO BRONCHOSCOPY Bilateral 10/27/2013   Procedure: VIDEO BRONCHOSCOPY WITH FLUORO;  Surgeon: Collene Gobble, MD;  Location: [facility];  Service: Cardiopulmonary;  Laterality: Bilateral;    REVIEW OF SYSTEMS:  Constitutional: negative Eyes: negative Ears, nose, mouth, throat, and face:  negative Respiratory: negative Cardiovascular: negative Gastrointestinal: negative Genitourinary:negative Integument/breast: negative Hematologic/lymphatic: negative Musculoskeletal:negative Neurological: negative Behavioral/Psych: negative Endocrine: negative Allergic/Immunologic: negative   PHYSICAL EXAMINATION: General appearance: alert, cooperative and no distress Head: Normocephalic, without obvious abnormality,  atraumatic Neck: no adenopathy, no JVD, supple, symmetrical, trachea midline and thyroid not enlarged, symmetric, no tenderness/mass/nodules Lymph nodes: Cervical, supraclavicular, and axillary nodes normal. Resp: clear to auscultation bilaterally Back: symmetric, no curvature. ROM normal. No CVA tenderness. Cardio: regular rate and rhythm, S1, S2 normal, no murmur, click, rub or gallop GI: soft, non-tender; bowel sounds normal; no masses,  no organomegaly Extremities: extremities normal, atraumatic, no cyanosis or edema and With paronychia involving the left big toe. Neurologic: Alert and oriented X 3, normal strength and tone. Normal symmetric reflexes. Normal coordination and gait   ECOG PERFORMANCE STATUS: 1 - Symptomatic but completely ambulatory  Blood pressure (!) 142/59, pulse 78, temperature 98.1 F (36.7 C), temperature source Oral, resp. rate 18, height '5\' 8"'$  (1.727 m), weight 217 lb 8 oz (98.7 kg), SpO2 98 %.  LABORATORY DATA: Lab Results  Component Value Date   WBC 7.4 03/10/2016   HGB 12.6 03/10/2016   HCT 37.6 03/10/2016   MCV 86.2 03/10/2016   PLT 175 03/10/2016      Chemistry      Component Value Date/Time   NA 140 03/10/2016 1239   K 4.6 03/10/2016 1239   CO2 26 03/10/2016 1239   BUN 14.5 03/10/2016 1239   CREATININE 0.9 03/10/2016 1239      Component Value Date/Time   CALCIUM 9.7 03/10/2016 1239   ALKPHOS 79 03/10/2016 1239   AST 22 03/10/2016 1239   ALT 14 03/10/2016 1239   BILITOT 0.46 03/10/2016 1239       RADIOGRAPHIC STUDIES: Ct Chest W Contrast  Result Date: 03/10/2016 CLINICAL DATA:  Lung cancer. EXAM: CT CHEST, ABDOMEN, AND PELVIS WITH CONTRAST TECHNIQUE: Multidetector CT imaging of the chest, abdomen and pelvis was performed following the standard protocol during bolus administration of intravenous contrast. CONTRAST:  13m ISOVUE-300 IOPAMIDOL (ISOVUE-300) INJECTION 61% COMPARISON:  12/13/2015 FINDINGS: CT CHEST FINDINGS Cardiovascular:  The heart size appears within normal limits. No pericardial effusion. Aortic atherosclerosis noted. Mediastinum/Nodes: The trachea appears patent and is midline. Normal appearance of the esophagus. No mediastinal or hilar adenopathy. Lungs/Pleura: No pleural effusion. Post treatment changes within the left upper lobe again noted. Index soft tissue density in this area measures 3.2 x 1.9 cm, image 66 of series 4. Previously 3.5 x 1.9 cm. Faint ground-glass attenuating nodule in the left upper lobe measures 9 mm and is unchanged from the previous exam. Musculoskeletal: Spondylosis is identified within the thoracic spine. No aggressive lytic or sclerotic CT ABDOMEN PELVIS FINDINGS Hepatobiliary: No focal liver abnormality is seen. No gallstones, gallbladder wall thickening, or biliary dilatation. Pancreas: Unremarkable. No pancreatic ductal dilatation or surrounding inflammatory changes. Spleen: Stable cyst within the central spleen measuring 8 mm, image 55 of series 2. No splenomegaly. Adrenals/Urinary Tract: Nonobstructing calculus is noted within the inferior pole of the right kidney measuring 4 mm. The adrenal glands are normal. 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 Urinary bladder appears normal. Stomach/Bowel: Stomach is within normal limits. Appendix appears normal. No evidence of bowel wall thickening, distention, or inflammatory changes. Vascular/Lymphatic: Calcified atherosclerotic disease involves the abdominal aorta. No aneurysm. No enlarged retroperitoneal or mesenteric adenopathy. No enlarged pelvic or inguinal lymph nodes. Reproductive: Previous hysterectomy. There is no ascites or focal fluid collections  within the abdomen or pelvis. Other: No abdominal wall hernia or abnormality. No abdominopelvic ascites. Musculoskeletal: No acute or significant osseous findings. IMPRESSION: 1. Stable post treatment changes within the left lung. No specific findings  identified to suggest residual or recurrence of tumor or metastatic disease. 2. No change in 9 mm ground-glass attenuating nodule within the left upper lobe. Attention on follow-up imaging is recommended. Electronically Signed   By: Kerby Moors M.D.   On: 03/10/2016 15:16   Ct Abdomen Pelvis W Contrast  Result Date: 03/10/2016 CLINICAL DATA:  Lung cancer. EXAM: CT CHEST, ABDOMEN, AND PELVIS WITH CONTRAST TECHNIQUE: Multidetector CT imaging of the chest, abdomen and pelvis was performed following the standard protocol during bolus administration of intravenous contrast. CONTRAST:  161m ISOVUE-300 IOPAMIDOL (ISOVUE-300) INJECTION 61% COMPARISON:  12/13/2015 FINDINGS: CT CHEST FINDINGS Cardiovascular: The heart size appears within normal limits. No pericardial effusion. Aortic atherosclerosis noted. Mediastinum/Nodes: The trachea appears patent and is midline. Normal appearance of the esophagus. No mediastinal or hilar adenopathy. Lungs/Pleura: No pleural effusion. Post treatment changes within the left upper lobe again noted. Index soft tissue density in this area measures 3.2 x 1.9 cm, image 66 of series 4. Previously 3.5 x 1.9 cm. Faint ground-glass attenuating nodule in the left upper lobe measures 9 mm and is unchanged from the previous exam. Musculoskeletal: Spondylosis is identified within the thoracic spine. No aggressive lytic or sclerotic CT ABDOMEN PELVIS FINDINGS Hepatobiliary: No focal liver abnormality is seen. No gallstones, gallbladder wall thickening, or biliary dilatation. Pancreas: Unremarkable. No pancreatic ductal dilatation or surrounding inflammatory changes. Spleen: Stable cyst within the central spleen measuring 8 mm, image 55 of series 2. No splenomegaly. Adrenals/Urinary Tract: Nonobstructing calculus is noted within the inferior pole of the right kidney measuring 4 mm. The adrenal glands are normal. 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 Urinary bladder appears normal. Stomach/Bowel: Stomach is within normal limits. Appendix appears normal. No evidence of bowel wall thickening, distention, or inflammatory changes. Vascular/Lymphatic: Calcified atherosclerotic disease involves the abdominal aorta. No aneurysm. No enlarged retroperitoneal or mesenteric adenopathy. No enlarged pelvic or inguinal lymph nodes. Reproductive: Previous hysterectomy. There is no ascites or focal fluid collections within the abdomen or pelvis. Other: No abdominal wall hernia or abnormality. No abdominopelvic ascites. Musculoskeletal: No acute or significant osseous findings. IMPRESSION: 1. Stable post treatment changes within the left lung. No specific findings identified to suggest residual or recurrence of tumor or metastatic disease. 2. No change in 9 mm ground-glass attenuating nodule within the left upper lobe. Attention on follow-up imaging is recommended. Electronically Signed   By: TKerby MoorsM.D.   On: 03/10/2016 15:16   ASSESSMENT AND PLAN: This is a very pleasant 65years old white female recently diagnosed with stage IV non-small cell lung cancer, adenocarcinoma with positive EGFR mutation in exon 21 (L858R) and recently developed T790M resistant mutation. She was treated with Tarceva 150 mg by mouth daily status post 5 months of treatment and tolerating it fairly well except for the skin rash and occasional diarrhea as well as paronychia of the toes.  She completed treatment with Tarceva 100 mg by mouth daily for the last 8 months. This was discontinued secondary to disease progression. The recent molecular studies showed evidence for T790M resistant mutation. The patient was started on treatment with Tagrisso 80 mg by mouth daily on 03/04/2015, Status post 12 months of treatment and has been tolerating her treatment fairly well with no  significant adverse effects. The recent CT scan of the chest, abdomen and pelvis showed stable disease with  no findings to suggest residual or recurrence of tumor or metastatic disease. I discussed the scan results with the patient and her husband. I recommended for the patient to continue her current treatment with Tagrisso. I will see her back for follow-up visit in one month for reevaluation with repeat blood work.  She was advised to call immediately if she has any concerning symptoms in the interval. The patient voices understanding of current disease status and treatment options and is in agreement with the current care plan.  All questions were answered. The patient knows to call the clinic with any problems, questions or concerns. We can certainly see the patient much sooner if necessary.  Disclaimer: This note was dictated with voice recognition software. Similar sounding words can inadvertently be transcribed and may not be corrected upon review.

## 2016-04-25 ENCOUNTER — Telehealth: Payer: Self-pay | Admitting: Internal Medicine

## 2016-04-25 ENCOUNTER — Other Ambulatory Visit (HOSPITAL_BASED_OUTPATIENT_CLINIC_OR_DEPARTMENT_OTHER): Payer: Medicare Other

## 2016-04-25 ENCOUNTER — Ambulatory Visit (HOSPITAL_BASED_OUTPATIENT_CLINIC_OR_DEPARTMENT_OTHER): Payer: Medicare Other | Admitting: Internal Medicine

## 2016-04-25 ENCOUNTER — Encounter: Payer: Self-pay | Admitting: Internal Medicine

## 2016-04-25 VITALS — BP 132/63 | HR 83 | Temp 98.3°F | Resp 18 | Wt 216.7 lb

## 2016-04-25 DIAGNOSIS — C787 Secondary malignant neoplasm of liver and intrahepatic bile duct: Secondary | ICD-10-CM

## 2016-04-25 DIAGNOSIS — Z5111 Encounter for antineoplastic chemotherapy: Secondary | ICD-10-CM

## 2016-04-25 DIAGNOSIS — C3492 Malignant neoplasm of unspecified part of left bronchus or lung: Secondary | ICD-10-CM

## 2016-04-25 DIAGNOSIS — C3412 Malignant neoplasm of upper lobe, left bronchus or lung: Secondary | ICD-10-CM | POA: Diagnosis not present

## 2016-04-25 LAB — CBC WITH DIFFERENTIAL/PLATELET
BASO%: 0.1 % (ref 0.0–2.0)
BASOS ABS: 0 10*3/uL (ref 0.0–0.1)
EOS ABS: 0.2 10*3/uL (ref 0.0–0.5)
EOS%: 2.2 % (ref 0.0–7.0)
HEMATOCRIT: 37.5 % (ref 34.8–46.6)
HEMOGLOBIN: 12.4 g/dL (ref 11.6–15.9)
LYMPH#: 2.1 10*3/uL (ref 0.9–3.3)
LYMPH%: 25.6 % (ref 14.0–49.7)
MCH: 28.8 pg (ref 25.1–34.0)
MCHC: 33.2 g/dL (ref 31.5–36.0)
MCV: 86.9 fL (ref 79.5–101.0)
MONO#: 0.7 10*3/uL (ref 0.1–0.9)
MONO%: 8.6 % (ref 0.0–14.0)
NEUT#: 5.2 10*3/uL (ref 1.5–6.5)
NEUT%: 63.5 % (ref 38.4–76.8)
PLATELETS: 188 10*3/uL (ref 145–400)
RBC: 4.32 10*6/uL (ref 3.70–5.45)
RDW: 14 % (ref 11.2–14.5)
WBC: 8.2 10*3/uL (ref 3.9–10.3)

## 2016-04-25 LAB — COMPREHENSIVE METABOLIC PANEL
ALBUMIN: 3.6 g/dL (ref 3.5–5.0)
ALK PHOS: 76 U/L (ref 40–150)
ALT: 19 U/L (ref 0–55)
AST: 23 U/L (ref 5–34)
Anion Gap: 8 mEq/L (ref 3–11)
BILIRUBIN TOTAL: 0.47 mg/dL (ref 0.20–1.20)
BUN: 14.7 mg/dL (ref 7.0–26.0)
CALCIUM: 9.4 mg/dL (ref 8.4–10.4)
CO2: 23 mEq/L (ref 22–29)
CREATININE: 0.9 mg/dL (ref 0.6–1.1)
Chloride: 108 mEq/L (ref 98–109)
EGFR: 69 mL/min/{1.73_m2} — ABNORMAL LOW (ref >90)
Glucose: 89 mg/dl (ref 70–140)
Potassium: 4.4 mEq/L (ref 3.5–5.1)
Sodium: 139 mEq/L (ref 136–145)
TOTAL PROTEIN: 7.4 g/dL (ref 6.4–8.3)

## 2016-04-25 NOTE — Progress Notes (Signed)
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  PRIOR THERAPY:  1) Tarceva 150 mg by mouth daily. Started 11/25/2013. Status post 5 months of treatment discontinued today secondary to intolerance with persistent paronychia. 2) Tarceva 100 mg by mouth daily. Started 06/03/2014. Status post 8 months of treatment.   CURRENT THERAPY: Tagrisso 80 mg by mouth daily. First dose started 03/04/2015. Status post 13 months of treatment.  INTERVAL HISTORY: Debra Hodges 65 y.o. female returns to the clinic today for followup visit accompanied by her husband. The patient is feeling fine today with no specific complaints. She enjoyed her [place] at the beach with her brother and family. She continues to tolerate her treatment with Tagrisso 80 mg by mouth daily fairly well with no significant adverse effects. She denied having any significant skin rash or diarrhea. She denied having any significant chest pain, shortness of breath or hemoptysis. She denied having any significant weight loss or night sweats. She has no nausea or vomiting. She is here today for reevaluation with repeat blood work.  MEDICAL HISTORY: Past Medical History:  Diagnosis Date  . High blood pressure   . Non-small cell carcinoma of lung, stage 4 ([place]) dx'd 10/2013    ALLERGIES:  is allergic to penicillins.  MEDICATIONS:  Current Outpatient Prescriptions  Medication Sig Dispense Refill  . acetaminophen (TYLENOL) 325 MG tablet Take 650 mg by mouth every 6 (six) hours as needed. Reported on 06/15/2015    . calcium carbonate (OS-CAL) 600 MG  TABS tablet Take 600 mg by mouth daily.     Marland Kitchen CLIMARA 0.1 MG/24HR patch 1 patch once a week.    . levothyroxine (SYNTHROID, LEVOTHROID) 25 MCG tablet Take 25 mcg by mouth daily before breakfast.    . metroNIDAZOLE (METROCREAM) 0.75 % cream Apply 1 application topically daily. Reported on 11/22/2015    . Multiple Vitamins-Calcium (ONE-A-DAY WOMENS PO) Take 1 tablet by mouth daily.    Marland Kitchen osimertinib mesylate (TAGRISSO) 80 MG tablet Take 1 tablet (80 mg total) by mouth daily. rx faxed to [facility] on 8/29 with 2 refills 30 tablet 2  . potassium chloride SA (K-DUR,KLOR-CON) 20 MEQ tablet 1 tablet 2 (two) times daily.    . Sodium Fluoride (CLINPRO 5000) 1.1 % PSTE Place 1 application onto teeth 3 (three) times daily.    Marland Kitchen spironolactone (ALDACTONE) 25 MG tablet 1 tablet daily.    . vitamin C (ASCORBIC ACID) 500 MG tablet Take 500 mg by mouth daily.    Marland Kitchen loperamide (IMODIUM) 2 MG capsule Take by mouth as needed for diarrhea or loose stools. Reported on 11/22/2015     No current facility-administered medications for this visit.     SURGICAL HISTORY:  Past Surgical History:  Procedure Laterality Date  . ABDOMINAL HYSTERECTOMY    . BREAST BIOPSY     Right breast  . VIDEO BRONCHOSCOPY Bilateral 10/27/2013   Procedure: VIDEO BRONCHOSCOPY WITH FLUORO;  Surgeon: Collene Gobble, MD;  Location: [facility];  Service: Cardiopulmonary;  Laterality: Bilateral;    REVIEW OF SYSTEMS:  A comprehensive review of systems was negative.   PHYSICAL EXAMINATION: General appearance:  alert, cooperative and no distress Head: Normocephalic, without obvious abnormality, atraumatic Neck: no adenopathy, no JVD, supple, symmetrical, trachea midline and thyroid not enlarged, symmetric, no tenderness/mass/nodules Lymph nodes: Cervical, supraclavicular, and axillary nodes normal. Resp: clear to auscultation bilaterally Back: symmetric, no curvature. ROM normal. No CVA tenderness. Cardio: regular rate and rhythm, S1, S2 normal, no  murmur, click, rub or gallop GI: soft, non-tender; bowel sounds normal; no masses,  no organomegaly Extremities: extremities normal, atraumatic, no cyanosis or edema and With paronychia involving the left big toe. Neurologic: Alert and oriented X 3, normal strength and tone. Normal symmetric reflexes. Normal coordination and gait   ECOG PERFORMANCE STATUS: 1 - Symptomatic but completely ambulatory  Blood pressure 132/63, pulse 83, temperature 98.3 F (36.8 C), temperature source Oral, resp. rate 18, weight 216 lb 11.2 oz (98.3 kg), SpO2 100 %.  LABORATORY DATA: Lab Results  Component Value Date   WBC 8.2 04/25/2016   HGB 12.4 04/25/2016   HCT 37.5 04/25/2016   MCV 86.9 04/25/2016   PLT 188 04/25/2016      Chemistry      Component Value Date/Time   NA 140 03/10/2016 1239   K 4.6 03/10/2016 1239   CO2 26 03/10/2016 1239   BUN 14.5 03/10/2016 1239   CREATININE 0.9 03/10/2016 1239      Component Value Date/Time   CALCIUM 9.7 03/10/2016 1239   ALKPHOS 79 03/10/2016 1239   AST 22 03/10/2016 1239   ALT 14 03/10/2016 1239   BILITOT 0.46 03/10/2016 1239       RADIOGRAPHIC STUDIES: No results found. ASSESSMENT AND PLAN: This is a very pleasant 65 years old white female recently diagnosed with stage IV non-small cell lung cancer, adenocarcinoma with positive EGFR mutation in exon 21 (L858R) and recently developed T790M resistant mutation. She was treated with Tarceva 150 mg by mouth daily status post 5 months of treatment and tolerating it fairly well except for the skin rash and occasional diarrhea as well as paronychia of the toes.  She completed treatment with Tarceva 100 mg by mouth daily for the last 8 months. This was discontinued secondary to disease progression. The recent molecular studies showed evidence for T790M resistant mutation. The patient was started on treatment with Tagrisso 80 mg by mouth daily on 03/04/2015, Status post 13 months of treatment and has been  tolerating her treatment fairly well with no significant adverse effects. Recent CBC is unremarkable. Comprehensive metabolic panel is still pending I recommended for the patient to continue her current treatment with Tagrisso. I will see her back for follow-up visit in one month for reevaluation with repeat blood work.  She was advised to call immediately if she has any concerning symptoms in the interval. The patient voices understanding of current disease status and treatment options and is in agreement with the current care plan.  All questions were answered. The patient knows to call the clinic with any problems, questions or concerns. We can certainly see the patient much sooner if necessary.  Disclaimer: This note was dictated with voice recognition software. Similar sounding words can inadvertently be transcribed and may not be corrected upon review.

## 2016-04-25 NOTE — Telephone Encounter (Signed)
Appointments scheduled per 04/25/16 los. A copy of the AVS report and appointment schedule was given to the patient, per 04/25/16 los.

## 2016-05-18 ENCOUNTER — Telehealth: Payer: Self-pay | Admitting: Medical Oncology

## 2016-05-18 ENCOUNTER — Other Ambulatory Visit: Payer: Self-pay | Admitting: Medical Oncology

## 2016-05-18 DIAGNOSIS — C3412 Malignant neoplasm of upper lobe, left bronchus or lung: Secondary | ICD-10-CM

## 2016-05-18 MED ORDER — OSIMERTINIB MESYLATE 80 MG PO TABS: 80.0000 mg | ORAL_TABLET | Freq: Every day | ORAL | 2 refills | Status: DC

## 2016-05-18 NOTE — Telephone Encounter (Signed)
Returned call . Refilled tagrisso.

## 2016-05-25 ENCOUNTER — Telehealth: Payer: Self-pay | Admitting: Internal Medicine

## 2016-05-25 ENCOUNTER — Ambulatory Visit (HOSPITAL_BASED_OUTPATIENT_CLINIC_OR_DEPARTMENT_OTHER): Payer: Medicare Other | Admitting: Internal Medicine

## 2016-05-25 ENCOUNTER — Other Ambulatory Visit (HOSPITAL_BASED_OUTPATIENT_CLINIC_OR_DEPARTMENT_OTHER): Payer: Medicare Other

## 2016-05-25 ENCOUNTER — Encounter: Payer: Self-pay | Admitting: Internal Medicine

## 2016-05-25 VITALS — BP 138/58 | HR 78 | Temp 97.9°F | Resp 18 | Wt 215.0 lb

## 2016-05-25 DIAGNOSIS — C3412 Malignant neoplasm of upper lobe, left bronchus or lung: Secondary | ICD-10-CM | POA: Diagnosis not present

## 2016-05-25 DIAGNOSIS — Z5111 Encounter for antineoplastic chemotherapy: Secondary | ICD-10-CM

## 2016-05-25 DIAGNOSIS — C3492 Malignant neoplasm of unspecified part of left bronchus or lung: Secondary | ICD-10-CM

## 2016-05-25 DIAGNOSIS — C787 Secondary malignant neoplasm of liver and intrahepatic bile duct: Secondary | ICD-10-CM | POA: Diagnosis not present

## 2016-05-25 LAB — CBC WITH DIFFERENTIAL/PLATELET
BASO%: 0.1 % (ref 0.0–2.0)
BASOS ABS: 0 10*3/uL (ref 0.0–0.1)
EOS%: 2.6 % (ref 0.0–7.0)
Eosinophils Absolute: 0.2 10*3/uL (ref 0.0–0.5)
HCT: 37.1 % (ref 34.8–46.6)
HEMOGLOBIN: 12.4 g/dL (ref 11.6–15.9)
LYMPH%: 27.1 % (ref 14.0–49.7)
MCH: 29.2 pg (ref 25.1–34.0)
MCHC: 33.5 g/dL (ref 31.5–36.0)
MCV: 87.3 fL (ref 79.5–101.0)
MONO#: 0.6 10*3/uL (ref 0.1–0.9)
MONO%: 7.7 % (ref 0.0–14.0)
NEUT#: 4.9 10*3/uL (ref 1.5–6.5)
NEUT%: 62.5 % (ref 38.4–76.8)
Platelets: 196 10*3/uL (ref 145–400)
RBC: 4.25 10*6/uL (ref 3.70–5.45)
RDW: 14.2 % (ref 11.2–14.5)
WBC: 7.9 10*3/uL (ref 3.9–10.3)
lymph#: 2.1 10*3/uL (ref 0.9–3.3)

## 2016-05-25 LAB — COMPREHENSIVE METABOLIC PANEL
ALT: 16 U/L (ref 0–55)
AST: 23 U/L (ref 5–34)
Albumin: 3.8 g/dL (ref 3.5–5.0)
Alkaline Phosphatase: 80 U/L (ref 40–150)
Anion Gap: 11 mEq/L (ref 3–11)
BUN: 16.4 mg/dL (ref 7.0–26.0)
CHLORIDE: 105 meq/L (ref 98–109)
CO2: 25 meq/L (ref 22–29)
Calcium: 9.3 mg/dL (ref 8.4–10.4)
Creatinine: 0.9 mg/dL (ref 0.6–1.1)
EGFR: 70 mL/min/{1.73_m2} — ABNORMAL LOW (ref >90)
GLUCOSE: 92 mg/dL (ref 70–140)
POTASSIUM: 3.6 meq/L (ref 3.5–5.1)
SODIUM: 141 meq/L (ref 136–145)
Total Bilirubin: 0.62 mg/dL (ref 0.20–1.20)
Total Protein: 7.6 g/dL (ref 6.4–8.3)

## 2016-05-25 NOTE — Progress Notes (Signed)
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  PRIOR THERAPY:  1) Tarceva 150 mg by mouth daily. Started 11/25/2013. Status post 5 months of treatment discontinued today secondary to intolerance with persistent paronychia. 2) Tarceva 100 mg by mouth daily. Started 06/03/2014. Status post 8 months of treatment.   CURRENT THERAPY: Tagrisso 80 mg by mouth daily. First dose started 03/04/2015. Status post 14 months of treatment.  INTERVAL HISTORY: Debra Hodges 65 y.o. female came to the clinic today for follow-up visit. The patient is currently undergoing treatment with Tagrisso 80 mg by mouth daily and tolerating her treatment fairly well with no significant adverse effects. She had the flu a few weeks ago but she recovered well. She denied having any current chest pain, shortness of breath, cough or hemoptysis. She has no significant weight loss or night sweats. She has no significant skin rash or diarrhea. She is here today for evaluation and repeat blood work.   MEDICAL HISTORY: Past Medical History:  Diagnosis Date  . High blood pressure   . Non-small cell carcinoma of lung, stage 4 ([place]) dx'd 10/2013    ALLERGIES:  is allergic to penicillins.  MEDICATIONS:  Current Outpatient Prescriptions  Medication Sig Dispense Refill  . acetaminophen (TYLENOL) 325 MG tablet Take 650 mg by mouth every 6 (six) hours as needed. Reported on 06/15/2015    . calcium carbonate (OS-CAL) 600 MG TABS tablet Take 600 mg by mouth daily.     Marland Kitchen CLIMARA 0.1 MG/24HR patch 1 patch once a week.    . levothyroxine  (SYNTHROID, LEVOTHROID) 25 MCG tablet Take 25 mcg by mouth daily before breakfast.    . loperamide (IMODIUM) 2 MG capsule Take by mouth as needed for diarrhea or loose stools. Reported on 11/22/2015    . metroNIDAZOLE (METROCREAM) 0.75 % cream Apply 1 application topically daily. Reported on 11/22/2015    . Multiple Vitamins-Calcium (ONE-A-DAY WOMENS PO) Take 1 tablet by mouth daily.    Marland Kitchen osimertinib mesylate (TAGRISSO) 80 MG tablet Take 1 tablet (80 mg total) by mouth daily. rx called to [place] and me. 05/18/16 30 tablet 2  . potassium chloride SA (K-DUR,KLOR-CON) 20 MEQ tablet 1 tablet 2 (two) times daily.    . Sodium Fluoride (CLINPRO 5000) 1.1 % PSTE Place 1 application onto teeth 3 (three) times daily.    Marland Kitchen spironolactone (ALDACTONE) 25 MG tablet 1 tablet daily.    . vitamin C (ASCORBIC ACID) 500 MG tablet Take 500 mg by mouth daily.     No current facility-administered medications for this visit.     SURGICAL HISTORY:  Past Surgical History:  Procedure Laterality Date  . ABDOMINAL HYSTERECTOMY    . BREAST BIOPSY     Right breast  . VIDEO BRONCHOSCOPY Bilateral 10/27/2013   Procedure: VIDEO BRONCHOSCOPY WITH FLUORO;  Surgeon: Collene Gobble, MD;  Location: [facility];  Service: Cardiopulmonary;  Laterality: Bilateral;    REVIEW OF SYSTEMS:  A comprehensive review of systems was negative.   PHYSICAL EXAMINATION: General appearance: alert, cooperative and no distress Head: Normocephalic, without obvious abnormality, atraumatic Neck: no adenopathy, no JVD, supple, symmetrical,  trachea midline and thyroid not enlarged, symmetric, no tenderness/mass/nodules Lymph nodes: Cervical, supraclavicular, and axillary nodes normal. Resp: clear to auscultation bilaterally Back: symmetric, no curvature. ROM normal. No CVA tenderness. Cardio: regular rate and rhythm, S1, S2 normal, no murmur, click, rub or gallop GI: soft, non-tender; bowel sounds normal; no masses,  no organomegaly Extremities:  extremities normal, atraumatic, no cyanosis or edema   ECOG PERFORMANCE STATUS: 0 - Asymptomatic  Blood pressure (!) 138/58, pulse 78, temperature 97.9 F (36.6 C), temperature source Oral, resp. rate 18, weight 215 lb (97.5 kg), SpO2 98 %.  LABORATORY DATA: Lab Results  Component Value Date   WBC 7.9 05/25/2016   HGB 12.4 05/25/2016   HCT 37.1 05/25/2016   MCV 87.3 05/25/2016   PLT 196 05/25/2016      Chemistry      Component Value Date/Time   NA 141 05/25/2016 0854   K 3.6 05/25/2016 0854   CO2 25 05/25/2016 0854   BUN 16.4 05/25/2016 0854   CREATININE 0.9 05/25/2016 0854      Component Value Date/Time   CALCIUM 9.3 05/25/2016 0854   ALKPHOS 80 05/25/2016 0854   AST 23 05/25/2016 0854   ALT 16 05/25/2016 0854   BILITOT 0.62 05/25/2016 0854       RADIOGRAPHIC STUDIES: No results found. ASSESSMENT AND PLAN:  This is a very pleasant 65 years old white female with a stage IV non-small cell lung cancer, adenocarcinoma with initial positive EGFR mutation in exon 21 (L858R) status post treatment with Tarceva for 13 months then the patient developed T790M resistant mutation. She is currently on treatment with Tagrisso 80 mg by mouth daily status post 14 months. She is tolerating the treatment well with no concerning adverse effects. I recommended for the patient to continue her treatment with Tagrisso as ordered. I will see her back for follow-up visit in one month's for reevaluation with repeat CT scan of the chest, abdomen and pelvis for restaging of her disease. She was advised to call immediately if she has any concerning symptoms in the interval. The patient voices understanding of current disease status and treatment options and is in agreement with the current care plan. All questions were answered. The patient knows to call the clinic with any problems, questions or concerns. We can certainly see the patient much sooner if necessary. I spent 10 minutes counseling the  patient face to face. The total time spent in the appointment was 15 minutes. Disclaimer: This note was dictated with voice recognition software. Similar sounding words can inadvertently be transcribed and may not be corrected upon review.

## 2016-05-25 NOTE — Telephone Encounter (Signed)
Appointments scheduled per 12/28 LOS. Patient given AVS report and calendars with future scheduled appointments. Patient aware of CT scan appointment to be scheduled, given two bottles of contrast and instructions.

## 2016-06-23 ENCOUNTER — Other Ambulatory Visit (HOSPITAL_BASED_OUTPATIENT_CLINIC_OR_DEPARTMENT_OTHER): Payer: Medicare Other

## 2016-06-23 ENCOUNTER — Ambulatory Visit (HOSPITAL_COMMUNITY)
Admission: RE | Admit: 2016-06-23 | Discharge: 2016-06-23 | Disposition: A | Discharge status: Home / Self Care | Payer: Medicare Other | Source: Ambulatory Visit | Attending: Internal Medicine | Admitting: Internal Medicine

## 2016-06-23 DIAGNOSIS — C787 Secondary malignant neoplasm of liver and intrahepatic bile duct: Secondary | ICD-10-CM

## 2016-06-23 DIAGNOSIS — D1771 Benign lipomatous neoplasm of kidney: Secondary | ICD-10-CM | POA: Diagnosis not present

## 2016-06-23 DIAGNOSIS — D739 Disease of spleen, unspecified: Secondary | ICD-10-CM | POA: Insufficient documentation

## 2016-06-23 DIAGNOSIS — C3412 Malignant neoplasm of upper lobe, left bronchus or lung: Secondary | ICD-10-CM

## 2016-06-23 DIAGNOSIS — Z5111 Encounter for antineoplastic chemotherapy: Secondary | ICD-10-CM

## 2016-06-23 DIAGNOSIS — C3492 Malignant neoplasm of unspecified part of left bronchus or lung: Secondary | ICD-10-CM

## 2016-06-23 DIAGNOSIS — I700 Atherosclerosis of aorta: Secondary | ICD-10-CM | POA: Diagnosis not present

## 2016-06-23 DIAGNOSIS — D1772 Benign lipomatous neoplasm of other genitourinary organ: Secondary | ICD-10-CM | POA: Diagnosis not present

## 2016-06-23 DIAGNOSIS — R932 Abnormal findings on diagnostic imaging of liver and biliary tract: Secondary | ICD-10-CM | POA: Diagnosis not present

## 2016-06-23 DIAGNOSIS — N200 Calculus of kidney: Secondary | ICD-10-CM | POA: Diagnosis not present

## 2016-06-23 DIAGNOSIS — C3490 Malignant neoplasm of unspecified part of unspecified bronchus or lung: Secondary | ICD-10-CM | POA: Diagnosis not present

## 2016-06-23 LAB — CBC WITH DIFFERENTIAL/PLATELET
BASO%: 0.5 % (ref 0.0–2.0)
Basophils Absolute: 0 10*3/uL (ref 0.0–0.1)
EOS%: 1.4 % (ref 0.0–7.0)
Eosinophils Absolute: 0.1 10*3/uL (ref 0.0–0.5)
HCT: 38.5 % (ref 34.8–46.6)
HGB: 12.8 g/dL (ref 11.6–15.9)
LYMPH%: 27.6 % (ref 14.0–49.7)
MCH: 28.9 pg (ref 25.1–34.0)
MCHC: 33.1 g/dL (ref 31.5–36.0)
MCV: 87.3 fL (ref 79.5–101.0)
MONO#: 0.6 10*3/uL (ref 0.1–0.9)
MONO%: 7.8 % (ref 0.0–14.0)
NEUT#: 5 10*3/uL (ref 1.5–6.5)
NEUT%: 62.7 % (ref 38.4–76.8)
PLATELETS: 183 10*3/uL (ref 145–400)
RBC: 4.41 10*6/uL (ref 3.70–5.45)
RDW: 14.1 % (ref 11.2–14.5)
WBC: 7.9 10*3/uL (ref 3.9–10.3)
lymph#: 2.2 10*3/uL (ref 0.9–3.3)

## 2016-06-23 LAB — COMPREHENSIVE METABOLIC PANEL
ALT: 18 U/L (ref 0–55)
ANION GAP: 10 meq/L (ref 3–11)
AST: 23 U/L (ref 5–34)
Albumin: 4 g/dL (ref 3.5–5.0)
Alkaline Phosphatase: 82 U/L (ref 40–150)
BUN: 18.9 mg/dL (ref 7.0–26.0)
CHLORIDE: 105 meq/L (ref 98–109)
CO2: 25 meq/L (ref 22–29)
Calcium: 9.3 mg/dL (ref 8.4–10.4)
Creatinine: 0.9 mg/dL (ref 0.6–1.1)
EGFR: 67 mL/min/{1.73_m2} — AB (ref >90)
Glucose: 90 mg/dl (ref 70–140)
POTASSIUM: 4.4 meq/L (ref 3.5–5.1)
Sodium: 139 mEq/L (ref 136–145)
Total Bilirubin: 0.61 mg/dL (ref 0.20–1.20)
Total Protein: 7.8 g/dL (ref 6.4–8.3)

## 2016-06-23 MED ORDER — IOPAMIDOL (ISOVUE-300) INJECTION 61%
INTRAVENOUS | Status: AC
  Administered 2016-06-23: 100 mL
  Filled 2016-06-23: qty 100

## 2016-06-26 ENCOUNTER — Telehealth: Payer: Self-pay | Admitting: Internal Medicine

## 2016-06-26 ENCOUNTER — Ambulatory Visit (HOSPITAL_BASED_OUTPATIENT_CLINIC_OR_DEPARTMENT_OTHER): Payer: Medicare Other | Admitting: Internal Medicine

## 2016-06-26 ENCOUNTER — Encounter: Payer: Self-pay | Admitting: Internal Medicine

## 2016-06-26 VITALS — BP 128/49 | HR 88 | Temp 98.7°F | Resp 18 | Wt 213.3 lb

## 2016-06-26 DIAGNOSIS — C3412 Malignant neoplasm of upper lobe, left bronchus or lung: Secondary | ICD-10-CM

## 2016-06-26 DIAGNOSIS — Z5111 Encounter for antineoplastic chemotherapy: Secondary | ICD-10-CM

## 2016-06-26 DIAGNOSIS — E039 Hypothyroidism, unspecified: Secondary | ICD-10-CM | POA: Diagnosis not present

## 2016-06-26 DIAGNOSIS — K769 Liver disease, unspecified: Secondary | ICD-10-CM | POA: Insufficient documentation

## 2016-06-26 DIAGNOSIS — C3492 Malignant neoplasm of unspecified part of left bronchus or lung: Secondary | ICD-10-CM

## 2016-06-26 HISTORY — DX: Liver disease, unspecified: K76.9

## 2016-06-26 NOTE — Progress Notes (Signed)
[place] [phone]   [phone]  OFFICE PROGRESS NOTE  Default, Provider, MD No address on file  DIAGNOSIS: Stage IV (T1b, N3, M1b) non-small cell lung cancer, adenocarcinoma with positive EGFR mutation in exon 21 (L858R) and recently found to have T790M resistant mutation, presented with left upper lobe lung mass in addition to left hilar and bilateral mediastinal lymphadenopathy as well as liver metastasis diagnosed in May of 2015.  PRIOR THERAPY: 1) Tarceva 150 mg by mouth daily. Started 11/25/2013. Status post 5 months of treatment discontinued today secondary to intolerance with persistent paronychia. 2) Tarceva 100 mg by mouth daily. Started 06/03/2014. Status post 8 months of treatment.   CURRENT THERAPY: Tagrisso 80 mg by mouth daily status post 15 months of treatment. First dose was given 03/04/2015.  INTERVAL HISTORY: Debra Hodges 66 y.o. female returns to the clinic today for follow-up visit accompanied by her husband. The patient is doing fine today with no specific complaints. She denied having any chest pain, shortness of breath, cough or hemoptysis. She denied having any skin rash or diarrhea. She has no nausea or vomiting. She denied having any significant weight loss or night sweats. She has been tolerating her treatment with Tagrisso fairly well. She had repeat CT scan of the chest, abdomen and pelvis performed recently and she is here for evaluation and discussion of the scan results.  MEDICAL HISTORY: Past Medical History:  Diagnosis Date  . High blood pressure   . Non-small cell carcinoma of lung, stage 4 ([place]) dx'd 10/2013    ALLERGIES:  is allergic to penicillins.  MEDICATIONS:  Current Outpatient Prescriptions  Medication Sig Dispense Refill  . acetaminophen (TYLENOL) 325 MG tablet Take 650 mg by mouth every 6 (six) hours as needed. Reported on 06/15/2015    . calcium carbonate (OS-CAL) 600 MG TABS tablet Take 600  mg by mouth daily.     Marland Kitchen CLIMARA 0.1 MG/24HR patch 1 patch once a week.    . levothyroxine (SYNTHROID, LEVOTHROID) 25 MCG tablet Take 25 mcg by mouth daily before breakfast.    . loperamide (IMODIUM) 2 MG capsule Take by mouth as needed for diarrhea or loose stools. Reported on 11/22/2015    . metroNIDAZOLE (METROCREAM) 0.75 % cream Apply 1 application topically daily. Reported on 11/22/2015    . Multiple Vitamins-Calcium (ONE-A-DAY WOMENS PO) Take 1 tablet by mouth daily.    Marland Kitchen osimertinib mesylate (TAGRISSO) 80 MG tablet Take 1 tablet (80 mg total) by mouth daily. rx called to [facility] and me. 05/18/16 30 tablet 2  . potassium chloride SA (K-DUR,KLOR-CON) 20 MEQ tablet 1 tablet 2 (two) times daily.    . Sodium Fluoride (CLINPRO 5000) 1.1 % PSTE Place 1 application onto teeth 3 (three) times daily.    Marland Kitchen spironolactone (ALDACTONE) 25 MG tablet 1 tablet daily.    . vitamin C (ASCORBIC ACID) 500 MG tablet Take 500 mg by mouth daily.     No current facility-administered medications for this visit.     SURGICAL HISTORY:  Past Surgical History:  Procedure Laterality Date  . ABDOMINAL HYSTERECTOMY    . BREAST BIOPSY     Right breast  . VIDEO BRONCHOSCOPY Bilateral 10/27/2013   Procedure: VIDEO BRONCHOSCOPY WITH FLUORO;  Surgeon: Collene Gobble, MD;  Location: [facility];  Service: Cardiopulmonary;  Laterality: Bilateral;    REVIEW OF SYSTEMS:  Constitutional: negative Eyes: negative Ears, nose, mouth, throat, and face: negative Respiratory: negative Cardiovascular: negative  Gastrointestinal: negative Genitourinary:negative Integument/breast: negative Hematologic/lymphatic: negative Musculoskeletal:negative Neurological: negative Behavioral/Psych: negative Endocrine: negative Allergic/Immunologic: negative   PHYSICAL EXAMINATION: General appearance: alert, cooperative and no distress Head: Normocephalic, without obvious abnormality, atraumatic Neck: no adenopathy, no JVD, supple,  symmetrical, trachea midline and thyroid not enlarged, symmetric, no tenderness/mass/nodules Lymph nodes: Cervical, supraclavicular, and axillary nodes normal. Resp: clear to auscultation bilaterally Back: symmetric, no curvature. ROM normal. No CVA tenderness. Cardio: regular rate and rhythm, S1, S2 normal, no murmur, click, rub or gallop GI: soft, non-tender; bowel sounds normal; no masses,  no organomegaly Extremities: extremities normal, atraumatic, no cyanosis or edema Neurologic: Alert and oriented X 3, normal strength and tone. Normal symmetric reflexes. Normal coordination and gait  ECOG PERFORMANCE STATUS: 0 - Asymptomatic  Blood pressure (!) 128/49, pulse 88, temperature 98.7 F (37.1 C), temperature source Oral, resp. rate 18, weight 213 lb 4.8 oz (96.8 kg), SpO2 100 %.  LABORATORY DATA: Lab Results  Component Value Date   WBC 7.9 06/23/2016   HGB 12.8 06/23/2016   HCT 38.5 06/23/2016   MCV 87.3 06/23/2016   PLT 183 06/23/2016      Chemistry      Component Value Date/Time   NA 139 06/23/2016 0756   K 4.4 06/23/2016 0756   CO2 25 06/23/2016 0756   BUN 18.9 06/23/2016 0756   CREATININE 0.9 06/23/2016 0756      Component Value Date/Time   CALCIUM 9.3 06/23/2016 0756   ALKPHOS 82 06/23/2016 0756   AST 23 06/23/2016 0756   ALT 18 06/23/2016 0756   BILITOT 0.61 06/23/2016 0756       RADIOGRAPHIC STUDIES: Ct Chest W Contrast  Result Date: 06/23/2016 CLINICAL DATA:  Restaging lung cancer diagnosed in 2015. Chemotherapy ongoing. EXAM: CT CHEST, ABDOMEN, AND PELVIS WITH CONTRAST TECHNIQUE: Multidetector CT imaging of the chest, abdomen and pelvis was performed following the standard protocol during bolus administration of intravenous contrast. CONTRAST:  182m ISOVUE-300 IOPAMIDOL (ISOVUE-300) INJECTION 61% COMPARISON:  CT 03/10/2016 and 12/13/2015) FINDINGS: CT CHEST FINDINGS Cardiovascular: Aortic atherosclerosis again noted. No acute vascular findings are seen. The  heart size is normal. There is no pericardial effusion. Mediastinum/Nodes: There are no enlarged mediastinal, hilar or axillary lymph nodes. Stable mild thyroid nodularity. The trachea and esophagus demonstrate no significant findings. Lungs/Pleura: There is no pleural effusion. Overall lower lung volumes with generalized increased interstitial prominence, likely largely secondary to atelectasis. The residual lingular mass has not significantly changed, measuring 3.0 x 1.8 cm on image 55. Focal left upper lobe ground-glass density measuring 8 mm on image 43 is grossly stable, although less obvious given the background increase in interstitial prominence. No new or enlarging pulmonary nodules. Musculoskeletal/Chest wall: No chest wall mass or suspicious osseous findings. Small sclerotic lesion in the T4 vertebral body appears stable. CT ABDOMEN AND PELVIS FINDINGS Hepatobiliary: There is suboptimal opacification of the portal vein branches at the time of imaging, limiting evaluation of the liver. Possible new lesion centrally adjacent to the middle hepatic vein, measuring 10 mm on image 39 of series 2. This persists on the delayed post-contrast images and is not clearly vascular. More superiorly, there is another area of ill-defined low density which appears contiguous and is similar to previous studies. No other focal hepatic abnormalities are seen. No evidence of gallstones, gallbladder wall thickening or biliary dilatation. Pancreas: Unremarkable. No pancreatic ductal dilatation or surrounding inflammatory changes. Spleen: There is stable low-density splenic lesion measuring 8 mm inferiorly on image 46. The spleen otherwise appears  unremarkable. Adrenals/Urinary Tract: Both adrenal glands appear normal. Stable small nonobstructing calculus in the lower pole the left kidney, best seen on coronal image 102. 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 There are stable small low-density renal  lesions bilaterally, likely cysts. No evidence of hydronephrosis or bladder abnormality. Stomach/Bowel: No evidence of bowel wall thickening, distention or surrounding inflammatory change. Vascular/Lymphatic: There are no enlarged abdominal or pelvic lymph nodes. Stable aortic and branch vessel atherosclerosis. Reproductive: Hysterectomy.  No evidence of adnexal mass. Other: Stable postsurgical changes within the low anterior abdominal wall. No ascites or peritoneal nodularity. Musculoskeletal: No acute or significant osseous findings. There is a stable sclerotic lesion within the L5 vertebral body and mild lower lumbar spondylosis. IMPRESSION: 1. Stable appearance of the chest with unchanged residual lingular mass. No evidence of thoracic metastatic disease. 2. Possible new hepatic lesion adjacent to the middle hepatic vein, suboptimally evaluated by this examination. Metastatic disease cannot be excluded. Further evaluation with hepatic MRI without and with contrast suggested. 3. No other evidence of metastatic disease within the abdomen or pelvis. 4. Stable incidental findings including nonobstructing left renal calculus, small left renal angiomyolipoma, splenic cystic and atherosclerosis. Electronically Signed   By: Richardean Sale M.D.   On: 06/23/2016 12:00   Ct Abdomen Pelvis W Contrast  Result Date: 06/23/2016 CLINICAL DATA:  Restaging lung cancer diagnosed in 2015. Chemotherapy ongoing. EXAM: CT CHEST, ABDOMEN, AND PELVIS WITH CONTRAST TECHNIQUE: Multidetector CT imaging of the chest, abdomen and pelvis was performed following the standard protocol during bolus administration of intravenous contrast. CONTRAST:  119m ISOVUE-300 IOPAMIDOL (ISOVUE-300) INJECTION 61% COMPARISON:  CT 03/10/2016 and 12/13/2015) FINDINGS: CT CHEST FINDINGS Cardiovascular: Aortic atherosclerosis again noted. No acute vascular findings are seen. The heart size is normal. There is no pericardial effusion. Mediastinum/Nodes:  There are no enlarged mediastinal, hilar or axillary lymph nodes. Stable mild thyroid nodularity. The trachea and esophagus demonstrate no significant findings. Lungs/Pleura: There is no pleural effusion. Overall lower lung volumes with generalized increased interstitial prominence, likely largely secondary to atelectasis. The residual lingular mass has not significantly changed, measuring 3.0 x 1.8 cm on image 55. Focal left upper lobe ground-glass density measuring 8 mm on image 43 is grossly stable, although less obvious given the background increase in interstitial prominence. No new or enlarging pulmonary nodules. Musculoskeletal/Chest wall: No chest wall mass or suspicious osseous findings. Small sclerotic lesion in the T4 vertebral body appears stable. CT ABDOMEN AND PELVIS FINDINGS Hepatobiliary: There is suboptimal opacification of the portal vein branches at the time of imaging, limiting evaluation of the liver. Possible new lesion centrally adjacent to the middle hepatic vein, measuring 10 mm on image 39 of series 2. This persists on the delayed post-contrast images and is not clearly vascular. More superiorly, there is another area of ill-defined low density which appears contiguous and is similar to previous studies. No other focal hepatic abnormalities are seen. No evidence of gallstones, gallbladder wall thickening or biliary dilatation. Pancreas: Unremarkable. No pancreatic ductal dilatation or surrounding inflammatory changes. Spleen: There is stable low-density splenic lesion measuring 8 mm inferiorly on image 46. The spleen otherwise appears unremarkable. Adrenals/Urinary Tract: Both adrenal glands appear normal. Stable small nonobstructing calculus in the lower pole the left kidney, best seen on coronal image 102. 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 There are stable small low-density renal lesions bilaterally, likely cysts. No evidence of hydronephrosis or bladder  abnormality. Stomach/Bowel: No evidence of  bowel wall thickening, distention or surrounding inflammatory change. Vascular/Lymphatic: There are no enlarged abdominal or pelvic lymph nodes. Stable aortic and branch vessel atherosclerosis. Reproductive: Hysterectomy.  No evidence of adnexal mass. Other: Stable postsurgical changes within the low anterior abdominal wall. No ascites or peritoneal nodularity. Musculoskeletal: No acute or significant osseous findings. There is a stable sclerotic lesion within the L5 vertebral body and mild lower lumbar spondylosis. IMPRESSION: 1. Stable appearance of the chest with unchanged residual lingular mass. No evidence of thoracic metastatic disease. 2. Possible new hepatic lesion adjacent to the middle hepatic vein, suboptimally evaluated by this examination. Metastatic disease cannot be excluded. Further evaluation with hepatic MRI without and with contrast suggested. 3. No other evidence of metastatic disease within the abdomen or pelvis. 4. Stable incidental findings including nonobstructing left renal calculus, small left renal angiomyolipoma, splenic cystic and atherosclerosis. Electronically Signed   By: Richardean Sale M.D.   On: 06/23/2016 12:00    ASSESSMENT AND PLAN: This is a very pleasant 66 years old white female with a stage IV non-small cell lung cancer, adenocarcinoma with positive EGFR mutation in exon 21 diagnosed in May 2015 status post treatment with Tarceva for 13 months. She developed T790M resistant mutation and her treatment was switched to Tagrisso 80 mg by mouth daily status post 15 months of treatment. She is tolerating her treatment with Tagrisso fairly well was no significant adverse effects. She had repeat CT scan of the chest, abdomen and pelvis performed recently. I personally and independently reviewed the scan images and discuss the results and showed the images to the patient and her husband. Her scan showed no evidence for disease  progression but there was suspicious lesion in the liver. I recommended for the patient to have MRI of the liver with and without contrast for further evaluation of this lesion and to rule out the presence of any new metastasis. I will see the patient back for follow-up visit in 3 weeks for evaluation and discussion of the MRI results and further recommendation regarding her condition. For the hypothyroidism, the patient will continue her current treatment with levothyroxine. The patient was advised to call immediately if she has any concerning symptoms in the interval. The patient voices understanding of current disease status and treatment options and is in agreement with the current care plan.  All questions were answered. The patient knows to call the clinic with any problems, questions or concerns. We can certainly see the patient much sooner if necessary.  Disclaimer: This note was dictated with voice recognition software. Similar sounding words can inadvertently be transcribed and may not be corrected upon review.

## 2016-06-26 NOTE — Telephone Encounter (Signed)
Appointments scheduled per 06/26/16 los. Patient was given a copy of the appointment schedule and AVS report, per 06/26/16 los.

## 2016-07-06 ENCOUNTER — Telehealth: Payer: Self-pay | Admitting: *Deleted

## 2016-07-06 NOTE — Telephone Encounter (Signed)
"  I called Radiology scheduling who transferred me to MRI department who instructed me to call my doctor's nurse.  I've not been scheduled yet for MRI.  I need resolution.  I work full time and have to make arrangements for time off work."  Message left with [place] ext [phone].

## 2016-07-07 NOTE — Telephone Encounter (Signed)
I left message that MRI authorized.

## 2016-07-13 ENCOUNTER — Other Ambulatory Visit: Payer: Medicare Other

## 2016-07-14 ENCOUNTER — Other Ambulatory Visit (HOSPITAL_BASED_OUTPATIENT_CLINIC_OR_DEPARTMENT_OTHER): Payer: Medicare Other

## 2016-07-14 ENCOUNTER — Ambulatory Visit (HOSPITAL_COMMUNITY)
Admission: RE | Admit: 2016-07-14 | Discharge: 2016-07-14 | Disposition: A | Discharge status: Home / Self Care | Payer: Medicare Other | Source: Ambulatory Visit | Attending: Internal Medicine | Admitting: Internal Medicine

## 2016-07-14 DIAGNOSIS — D7389 Other diseases of spleen: Secondary | ICD-10-CM | POA: Insufficient documentation

## 2016-07-14 DIAGNOSIS — Z5111 Encounter for antineoplastic chemotherapy: Secondary | ICD-10-CM

## 2016-07-14 DIAGNOSIS — C3412 Malignant neoplasm of upper lobe, left bronchus or lung: Secondary | ICD-10-CM

## 2016-07-14 DIAGNOSIS — D1771 Benign lipomatous neoplasm of kidney: Secondary | ICD-10-CM | POA: Diagnosis not present

## 2016-07-14 DIAGNOSIS — K769 Liver disease, unspecified: Secondary | ICD-10-CM | POA: Diagnosis not present

## 2016-07-14 DIAGNOSIS — N289 Disorder of kidney and ureter, unspecified: Secondary | ICD-10-CM | POA: Insufficient documentation

## 2016-07-14 DIAGNOSIS — C3492 Malignant neoplasm of unspecified part of left bronchus or lung: Secondary | ICD-10-CM

## 2016-07-14 DIAGNOSIS — C787 Secondary malignant neoplasm of liver and intrahepatic bile duct: Secondary | ICD-10-CM | POA: Diagnosis not present

## 2016-07-14 DIAGNOSIS — D1772 Benign lipomatous neoplasm of other genitourinary organ: Secondary | ICD-10-CM | POA: Diagnosis not present

## 2016-07-14 LAB — COMPREHENSIVE METABOLIC PANEL
ALBUMIN: 4 g/dL (ref 3.5–5.0)
ALK PHOS: 87 U/L (ref 40–150)
ALT: 20 U/L (ref 0–55)
AST: 25 U/L (ref 5–34)
Anion Gap: 10 mEq/L (ref 3–11)
BILIRUBIN TOTAL: 0.4 mg/dL (ref 0.20–1.20)
BUN: 11.6 mg/dL (ref 7.0–26.0)
CO2: 24 meq/L (ref 22–29)
CREATININE: 0.8 mg/dL (ref 0.6–1.1)
Calcium: 9.4 mg/dL (ref 8.4–10.4)
Chloride: 106 mEq/L (ref 98–109)
EGFR: 78 mL/min/{1.73_m2} — ABNORMAL LOW (ref >90)
GLUCOSE: 89 mg/dL (ref 70–140)
Potassium: 3.7 mEq/L (ref 3.5–5.1)
SODIUM: 140 meq/L (ref 136–145)
TOTAL PROTEIN: 7.9 g/dL (ref 6.4–8.3)

## 2016-07-14 LAB — CBC WITH DIFFERENTIAL/PLATELET
BASO%: 0.1 % (ref 0.0–2.0)
Basophils Absolute: 0 10*3/uL (ref 0.0–0.1)
EOS ABS: 0.1 10*3/uL (ref 0.0–0.5)
EOS%: 1.7 % (ref 0.0–7.0)
HCT: 37.2 % (ref 34.8–46.6)
HGB: 12.4 g/dL (ref 11.6–15.9)
LYMPH%: 35 % (ref 14.0–49.7)
MCH: 29 pg (ref 25.1–34.0)
MCHC: 33.3 g/dL (ref 31.5–36.0)
MCV: 86.9 fL (ref 79.5–101.0)
MONO#: 0.6 10*3/uL (ref 0.1–0.9)
MONO%: 8.2 % (ref 0.0–14.0)
NEUT%: 55 % (ref 38.4–76.8)
NEUTROS ABS: 3.8 10*3/uL (ref 1.5–6.5)
Platelets: 180 10*3/uL (ref 145–400)
RBC: 4.28 10*6/uL (ref 3.70–5.45)
RDW: 14.2 % (ref 11.2–14.5)
WBC: 7 10*3/uL (ref 3.9–10.3)
lymph#: 2.4 10*3/uL (ref 0.9–3.3)

## 2016-07-14 MED ORDER — GADOBENATE DIMEGLUMINE 529 MG/ML IV SOLN
20.0000 mL | Freq: Once | INTRAVENOUS | Status: AC | PRN
  Administered 2016-07-14: 20 mL via INTRAVENOUS

## 2016-07-17 ENCOUNTER — Encounter: Payer: Self-pay | Admitting: Internal Medicine

## 2016-07-17 ENCOUNTER — Telehealth: Payer: Self-pay | Admitting: Internal Medicine

## 2016-07-17 ENCOUNTER — Ambulatory Visit (HOSPITAL_BASED_OUTPATIENT_CLINIC_OR_DEPARTMENT_OTHER): Payer: Medicare Other | Admitting: Internal Medicine

## 2016-07-17 VITALS — BP 133/62 | HR 81 | Temp 97.8°F | Resp 18 | Ht 68.0 in | Wt 213.5 lb

## 2016-07-17 DIAGNOSIS — C3412 Malignant neoplasm of upper lobe, left bronchus or lung: Secondary | ICD-10-CM

## 2016-07-17 DIAGNOSIS — Z5111 Encounter for antineoplastic chemotherapy: Secondary | ICD-10-CM

## 2016-07-17 DIAGNOSIS — K769 Liver disease, unspecified: Secondary | ICD-10-CM | POA: Diagnosis not present

## 2016-07-17 NOTE — Progress Notes (Signed)
[facility] [phone]   [phone]  OFFICE PROGRESS NOTE  Default, Provider, MD No address on file  DIAGNOSIS: Stage IV (T1b, N3, M1b) non-small cell lung cancer, adenocarcinoma with positive EGFR mutation in exon 21 (L858R) and recently found to have T790M resistant mutation, presented with left upper lobe lung mass in addition to left hilar and bilateral mediastinal lymphadenopathy as well as liver metastasis diagnosed in May of 2015.  PRIOR THERAPY: 1) Tarceva 150 mg by mouth daily. Started 11/25/2013. Status post 5 months of treatment discontinued today secondary to intolerance with persistent paronychia. 2) Tarceva 100 mg by mouth daily. Started 06/03/2014. Status post 8 months of treatment.   CURRENT THERAPY: Tagrisso 80 mg by mouth daily status post 16 months of treatment. First dose was given 03/04/2015.  INTERVAL HISTORY: Debra Hodges 66 y.o. female came to the clinic today for follow-up visit accompanied by her husband. The patient is feeling fine today with no specific complaints except for occasional nausea in the morning that resolved spontaneously. She denied having any significant chest pain, shortness of breath, cough or hemoptysis. She has no fever or chills. She denied having any significant weight loss or night sweats. She was found on previous CT scan of the abdomen to have a suspicious liver lesion. I ordered MRI of the liver which was performed recently and she is here for evaluation and discussion of her MRI results.   MEDICAL HISTORY: Past Medical History:  Diagnosis Date  . High blood pressure   . Liver lesion 06/26/2016  . Non-small cell carcinoma of lung, stage 4 ([place]) dx'd 10/2013    ALLERGIES:  is allergic to penicillins.  MEDICATIONS:  Current Outpatient Prescriptions  Medication Sig Dispense Refill  . acetaminophen (TYLENOL) 325 MG tablet Take 650 mg by mouth every 6 (six) hours as needed. Reported on  06/15/2015    . calcium carbonate (OS-CAL) 600 MG TABS tablet Take 600 mg by mouth daily.     Marland Kitchen CLIMARA 0.1 MG/24HR patch 1 patch once a week.    . levothyroxine (SYNTHROID, LEVOTHROID) 25 MCG tablet Take 25 mcg by mouth daily before breakfast.    . loperamide (IMODIUM) 2 MG capsule Take by mouth as needed for diarrhea or loose stools. Reported on 11/22/2015    . metroNIDAZOLE (METROCREAM) 0.75 % cream Apply 1 application topically daily. Reported on 11/22/2015    . Multiple Vitamins-Calcium (ONE-A-DAY WOMENS PO) Take 1 tablet by mouth daily.    Marland Kitchen osimertinib mesylate (TAGRISSO) 80 MG tablet Take 1 tablet (80 mg total) by mouth daily. rx called to [place] [facility] and me. 05/18/16 30 tablet 2  . potassium chloride SA (K-DUR,KLOR-CON) 20 MEQ tablet 1 tablet 2 (two) times daily.    . Sodium Fluoride (CLINPRO 5000) 1.1 % PSTE Place 1 application onto teeth 3 (three) times daily.    Marland Kitchen spironolactone (ALDACTONE) 25 MG tablet 1 tablet daily.    . vitamin C (ASCORBIC ACID) 500 MG tablet Take 500 mg by mouth daily.     No current facility-administered medications for this visit.     SURGICAL HISTORY:  Past Surgical History:  Procedure Laterality Date  . ABDOMINAL HYSTERECTOMY    . BREAST BIOPSY     Right breast  . VIDEO BRONCHOSCOPY Bilateral 10/27/2013   Procedure: VIDEO BRONCHOSCOPY WITH FLUORO;  Surgeon: Collene Gobble, MD;  Location: [facility];  Service: Cardiopulmonary;  Laterality: Bilateral;    REVIEW OF SYSTEMS:  A comprehensive review  of systems was negative except for: Gastrointestinal: positive for nausea   PHYSICAL EXAMINATION: General appearance: alert, cooperative and no distress Head: Normocephalic, without obvious abnormality, atraumatic Neck: no adenopathy, no JVD, supple, symmetrical, trachea midline and thyroid not enlarged, symmetric, no tenderness/mass/nodules Lymph nodes: Cervical, supraclavicular, and axillary nodes normal. Resp: clear to auscultation bilaterally Back: symmetric, no  curvature. ROM normal. No CVA tenderness. Cardio: regular rate and rhythm, S1, S2 normal, no murmur, click, rub or gallop GI: soft, non-tender; bowel sounds normal; no masses,  no organomegaly Extremities: extremities normal, atraumatic, no cyanosis or edema  ECOG PERFORMANCE STATUS: 0 - Asymptomatic  Blood pressure 133/62, pulse 81, temperature 97.8 F (36.6 C), temperature source Oral, resp. rate 18, height '5\' 8"'$  (1.727 m), weight 213 lb 8 oz (96.8 kg), SpO2 100 %.  LABORATORY DATA: Lab Results  Component Value Date   WBC 7.0 07/14/2016   HGB 12.4 07/14/2016   HCT 37.2 07/14/2016   MCV 86.9 07/14/2016   PLT 180 07/14/2016      Chemistry      Component Value Date/Time   NA 140 07/14/2016 1446   K 3.7 07/14/2016 1446   CO2 24 07/14/2016 1446   BUN 11.6 07/14/2016 1446   CREATININE 0.8 07/14/2016 1446      Component Value Date/Time   CALCIUM 9.4 07/14/2016 1446   ALKPHOS 87 07/14/2016 1446   AST 25 07/14/2016 1446   ALT 20 07/14/2016 1446   BILITOT 0.40 07/14/2016 1446       RADIOGRAPHIC STUDIES: Ct Chest W Contrast  Result Date: 06/23/2016 CLINICAL DATA:  Restaging lung cancer diagnosed in 2015. Chemotherapy ongoing. EXAM: CT CHEST, ABDOMEN, AND PELVIS WITH CONTRAST TECHNIQUE: Multidetector CT imaging of the chest, abdomen and pelvis was performed following the standard protocol during bolus administration of intravenous contrast. CONTRAST:  177m ISOVUE-300 IOPAMIDOL (ISOVUE-300) INJECTION 61% COMPARISON:  CT 03/10/2016 and 12/13/2015) FINDINGS: CT CHEST FINDINGS Cardiovascular: Aortic atherosclerosis again noted. No acute vascular findings are seen. The heart size is normal. There is no pericardial effusion. Mediastinum/Nodes: There are no enlarged mediastinal, hilar or axillary lymph nodes. Stable mild thyroid nodularity. The trachea and esophagus demonstrate no significant findings. Lungs/Pleura: There is no pleural effusion. Overall lower lung volumes with generalized  increased interstitial prominence, likely largely secondary to atelectasis. The residual lingular mass has not significantly changed, measuring 3.0 x 1.8 cm on image 55. Focal left upper lobe ground-glass density measuring 8 mm on image 43 is grossly stable, although less obvious given the background increase in interstitial prominence. No new or enlarging pulmonary nodules. Musculoskeletal/Chest wall: No chest wall mass or suspicious osseous findings. Small sclerotic lesion in the T4 vertebral body appears stable. CT ABDOMEN AND PELVIS FINDINGS Hepatobiliary: There is suboptimal opacification of the portal vein branches at the time of imaging, limiting evaluation of the liver. Possible new lesion centrally adjacent to the middle hepatic vein, measuring 10 mm on image 39 of series 2. This persists on the delayed post-contrast images and is not clearly vascular. More superiorly, there is another area of ill-defined low density which appears contiguous and is similar to previous studies. No other focal hepatic abnormalities are seen. No evidence of gallstones, gallbladder wall thickening or biliary dilatation. Pancreas: Unremarkable. No pancreatic ductal dilatation or surrounding inflammatory changes. Spleen: There is stable low-density splenic lesion measuring 8 mm inferiorly on image 46. The spleen otherwise appears unremarkable. Adrenals/Urinary Tract: Both adrenal glands appear normal. Stable small nonobstructing calculus in the lower pole the left kidney,  best seen on coronal image 102. 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 There are stable small low-density renal lesions bilaterally, likely cysts. No evidence of hydronephrosis or bladder abnormality. Stomach/Bowel: No evidence of bowel wall thickening, distention or surrounding inflammatory change. Vascular/Lymphatic: There are no enlarged abdominal or pelvic lymph nodes. Stable aortic and branch vessel atherosclerosis.  Reproductive: Hysterectomy.  No evidence of adnexal mass. Other: Stable postsurgical changes within the low anterior abdominal wall. No ascites or peritoneal nodularity. Musculoskeletal: No acute or significant osseous findings. There is a stable sclerotic lesion within the L5 vertebral body and mild lower lumbar spondylosis. IMPRESSION: 1. Stable appearance of the chest with unchanged residual lingular mass. No evidence of thoracic metastatic disease. 2. Possible new hepatic lesion adjacent to the middle hepatic vein, suboptimally evaluated by this examination. Metastatic disease cannot be excluded. Further evaluation with hepatic MRI without and with contrast suggested. 3. No other evidence of metastatic disease within the abdomen or pelvis. 4. Stable incidental findings including nonobstructing left renal calculus, small left renal angiomyolipoma, splenic cystic and atherosclerosis. Electronically Signed   By: Richardean Sale M.D.   On: 06/23/2016 12:00   Ct Abdomen Pelvis W Contrast  Result Date: 06/23/2016 CLINICAL DATA:  Restaging lung cancer diagnosed in 2015. Chemotherapy ongoing. EXAM: CT CHEST, ABDOMEN, AND PELVIS WITH CONTRAST TECHNIQUE: Multidetector CT imaging of the chest, abdomen and pelvis was performed following the standard protocol during bolus administration of intravenous contrast. CONTRAST:  126m ISOVUE-300 IOPAMIDOL (ISOVUE-300) INJECTION 61% COMPARISON:  CT 03/10/2016 and 12/13/2015) FINDINGS: CT CHEST FINDINGS Cardiovascular: Aortic atherosclerosis again noted. No acute vascular findings are seen. The heart size is normal. There is no pericardial effusion. Mediastinum/Nodes: There are no enlarged mediastinal, hilar or axillary lymph nodes. Stable mild thyroid nodularity. The trachea and esophagus demonstrate no significant findings. Lungs/Pleura: There is no pleural effusion. Overall lower lung volumes with generalized increased interstitial prominence, likely largely secondary to  atelectasis. The residual lingular mass has not significantly changed, measuring 3.0 x 1.8 cm on image 55. Focal left upper lobe ground-glass density measuring 8 mm on image 43 is grossly stable, although less obvious given the background increase in interstitial prominence. No new or enlarging pulmonary nodules. Musculoskeletal/Chest wall: No chest wall mass or suspicious osseous findings. Small sclerotic lesion in the T4 vertebral body appears stable. CT ABDOMEN AND PELVIS FINDINGS Hepatobiliary: There is suboptimal opacification of the portal vein branches at the time of imaging, limiting evaluation of the liver. Possible new lesion centrally adjacent to the middle hepatic vein, measuring 10 mm on image 39 of series 2. This persists on the delayed post-contrast images and is not clearly vascular. More superiorly, there is another area of ill-defined low density which appears contiguous and is similar to previous studies. No other focal hepatic abnormalities are seen. No evidence of gallstones, gallbladder wall thickening or biliary dilatation. Pancreas: Unremarkable. No pancreatic ductal dilatation or surrounding inflammatory changes. Spleen: There is stable low-density splenic lesion measuring 8 mm inferiorly on image 46. The spleen otherwise appears unremarkable. Adrenals/Urinary Tract: Both adrenal glands appear normal. Stable small nonobstructing calculus in the lower pole the left kidney, best seen on coronal image 102. 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 There are stable small low-density renal lesions bilaterally, likely cysts. No evidence of hydronephrosis or bladder abnormality. Stomach/Bowel: No evidence of bowel wall thickening, distention or surrounding inflammatory change. Vascular/Lymphatic: There are no enlarged abdominal or pelvic lymph nodes. Stable aortic  and branch vessel atherosclerosis. Reproductive: Hysterectomy.  No evidence of adnexal mass. Other: Stable  postsurgical changes within the low anterior abdominal wall. No ascites or peritoneal nodularity. Musculoskeletal: No acute or significant osseous findings. There is a stable sclerotic lesion within the L5 vertebral body and mild lower lumbar spondylosis. IMPRESSION: 1. Stable appearance of the chest with unchanged residual lingular mass. No evidence of thoracic metastatic disease. 2. Possible new hepatic lesion adjacent to the middle hepatic vein, suboptimally evaluated by this examination. Metastatic disease cannot be excluded. Further evaluation with hepatic MRI without and with contrast suggested. 3. No other evidence of metastatic disease within the abdomen or pelvis. 4. Stable incidental findings including nonobstructing left renal calculus, small left renal angiomyolipoma, splenic cystic and atherosclerosis. Electronically Signed   By: Richardean Sale M.D.   On: 06/23/2016 12:00   Mr Liver W Wo Contrast  Result Date: 07/15/2016 CLINICAL DATA:  History of stage IV small cell lung cancer. Possible new or enlarging hepatic lesion. Evaluate for metastatic disease. EXAM: MRI ABDOMEN WITHOUT AND WITH CONTRAST TECHNIQUE: Multiplanar multisequence MR imaging of the abdomen was performed both before and after the administration of intravenous contrast. CONTRAST:  38m MULTIHANCE GADOBENATE DIMEGLUMINE 529 MG/ML IV SOLN COMPARISON:  CT 06/23/2014 and 03/10/2016. Older CTs dating back to 02/16/2014 correlated. Report from a PET-CT 11/11/2013 is also correlated. FINDINGS: Lower chest: The visualized lower chest appears stable without suspicious findings. Hepatobiliary: Corresponding with the finding on recent CT is a well-circumscribed bilobed lesion adjacent to the middle hepatic vein in segment 4A. This is most obvious on the precontrast T1 weighted images on which the lesion demonstrates low signal. It measures 2.7 x 1.5 cm transverse on image 34 of series 900. There is associated T2 hyperintensity and mild  restricted diffusion. Post-contrast, it demonstrates early uniform enhancement (best seen on the subtracted images). There is no washout on the delayed images. As previously mentioned, this lesion appears larger than on previous available CTs. (There was a 2.3 x 1.7 cm lesion in this area on CT performed 12/24/2014, felt to reflect metastatic disease at that time). No other hepatic abnormalities are seen. No evidence of gallstones, gallbladder wall thickening or biliary dilatation. Pancreas: Unremarkable. No pancreatic ductal dilatation or surrounding inflammatory changes. Spleen: There is a stable small cystic lesion inferiorly. No suspicious findings. Adrenals/Urinary Tract: Both adrenal glands appear normal. There is a stable 10 mm angiomyolipoma in the lower pole of the left kidney and small bilateral renal cysts. No suspicious findings or hydronephrosis. Stomach/Bowel: No evidence of bowel wall thickening, distention or surrounding inflammatory change. Vascular/Lymphatic: There are no enlarged abdominal lymph nodes. No significant vascular findings are present. Other: No ascites or peritoneal nodularity. Musculoskeletal: No suspicious osseous lesions are seen. There is mild marrow heterogeneity with T2 hyperintense lesions within the T7 vertebral body and upper sacrum, best seen on coronal series 7. In correlation with prior CTs, these are likely hemangiomas. There is a small T1 hyperintense lesion within the right breast which is well circumscribed and unchanged in size. IMPRESSION: 1. The lesion of concern in the dome of the left hepatic lobe (segment 4A) demonstrates indeterminate imaging characteristics, although appears larger compared with previous available CTs. This lesion is quite subtle post-contrast, and therefore comparison with prior contrast-enhanced CTs is limited. I believe this may reflect an incidental vascular lesion such as atypical focal nodular hyperplasia, although recurrent metastasis  cannot be completely excluded. Management options include attempted percutaneous biopsy, follow-up MRI in 6 months or PET-CT  for further evaluation. 2. No other suspicious findings. 3. Stable cystic lesions in the spleen and kidneys and left renal angiomyolipoma. Electronically Signed   By: Richardean Sale M.D.   On: 07/15/2016 08:33    ASSESSMENT AND PLAN:  This is a very pleasant 66 years old white female with a stage IV non-small cell lung cancer, adenocarcinoma with positive EGFR mutation in exon 21 diagnosed in May 2015. She was treated with Tarceva for around 13 months. The patient developed T790M resistant mutation and she is currently on treatment with Tagrisso 80 mg by mouth daily status post 16 months. The patient this CT scan of the chest showed suspicious liver lesion but MRI of the liver did not show any concerning findings. I discussed the MRI results with the patient and her husband today. I recommended for her to continue her current treatment with Tagrisso with the same dose. I will see her back for follow-up visit in one month's for evaluation and repeat blood work. She was advised to call immediately if she has any concerning symptoms in the interval. The patient voices understanding of current disease status and treatment options and is in agreement with the current care plan.  All questions were answered. The patient knows to call the clinic with any problems, questions or concerns. We can certainly see the patient much sooner if necessary. I spent 10 minutes counseling the patient face to face. The total time spent in the appointment was 15 minutes.  Disclaimer: This note was dictated with voice recognition software. Similar sounding words can inadvertently be transcribed and may not be corrected upon review.

## 2016-07-17 NOTE — Telephone Encounter (Signed)
Gave patient avs report and appointments for March  °

## 2016-07-24 ENCOUNTER — Telehealth: Payer: Self-pay | Admitting: Medical Oncology

## 2016-07-24 ENCOUNTER — Telehealth: Payer: Self-pay | Admitting: Pharmacist

## 2016-07-24 NOTE — Telephone Encounter (Signed)
Approved for rx saving program for tagrisso

## 2016-07-24 NOTE — Telephone Encounter (Signed)
Oral Chemotherapy Pharmacist Encounter  Received notification that patient's [place] Nip would require a new prior authorization as current authorization expires 08/02/16. I called [employer] Clinical team at [phone] after being unable to complete PA online at [email or web address] as a PA is already on file. Clinical questions answered, PA status is pending Ref# [id number] May take 24-72h for final determination.  Oral oncology Clinic will continue to follow.  [name], PharmD, BCPS, BCOP 07/24/2016  2:05 PM Oral Oncology Clinic [phone]

## 2016-07-26 NOTE — Telephone Encounter (Signed)
Oral Chemotherapy Pharmacist Encounter  Received notification from [facility] that prior authorization for patient's Debra Hodges has been approved until 01/22/17  I called [employer] at [phone] to alert them of PA. I was informed they did not initiate PA. I was also informed that patient has been temporarily enrolled into [employer] prescription savings program pending receipt of an application and income documentation. They stated patient initiated re-enrollment on 07/20/16. They stated patient is aware of documentation needed for full enrollment into their program.  I called patient at home and LVM with offer to assist with application process. Phone number to Oral Oncology Clinic left on voicemail.  Oral Oncology Clinic will continue to follow.  [name], PharmD, BCPS, BCOP 07/26/2016  10:58 AM Oral Oncology Clinic [phone]

## 2016-08-07 ENCOUNTER — Telehealth: Payer: Self-pay | Admitting: Pharmacist

## 2016-08-07 NOTE — Telephone Encounter (Signed)
Oral Chemotherapy Pharmacist Encounter  Received partially completed [employer] application from patient. Rest of application completed and faxed to [employer] at [phone] Noted patient with temporary enrollment to [employer] pending application materials.  This encounter will continue to be updated until final determination.  Oral Oncology Clinic will continue to follow.   [name], PharmD, BCPS, BCOP 08/07/2016  2:48 PM Oral Oncology Clinic [phone]

## 2016-08-11 NOTE — Telephone Encounter (Signed)
Oral Chemotherapy Pharmacist Encounter  Received call from [employer] that patient has been successfully enrolled into their program to receive Tagrisso from the manufacturer at $0 out of pocket cost through 05/28/17.  I called and delivered the good news to [place]. She should be receiving next fill in 3-5 business days.  Patient expressed understanding and appreciation. She knows to call the office with questions or concerns.  No further needs from [facility] identified at this time. Oral Oncology Clinic will sign off. Please let us know if we can be of assistance in the future.   Thank you,  [name], PharmD, BCPS, BCOP 08/11/2016  11:52 AM Oral Oncology Clinic [phone]

## 2016-08-15 ENCOUNTER — Ambulatory Visit (HOSPITAL_BASED_OUTPATIENT_CLINIC_OR_DEPARTMENT_OTHER): Payer: Medicare Other | Admitting: Internal Medicine

## 2016-08-15 ENCOUNTER — Telehealth: Payer: Self-pay | Admitting: Internal Medicine

## 2016-08-15 ENCOUNTER — Encounter: Payer: Self-pay | Admitting: Internal Medicine

## 2016-08-15 ENCOUNTER — Other Ambulatory Visit (HOSPITAL_BASED_OUTPATIENT_CLINIC_OR_DEPARTMENT_OTHER): Payer: Medicare Other

## 2016-08-15 VITALS — BP 145/60 | HR 87 | Temp 98.5°F | Resp 18 | Ht 68.0 in | Wt 213.5 lb

## 2016-08-15 DIAGNOSIS — Z5111 Encounter for antineoplastic chemotherapy: Secondary | ICD-10-CM

## 2016-08-15 DIAGNOSIS — C787 Secondary malignant neoplasm of liver and intrahepatic bile duct: Secondary | ICD-10-CM | POA: Diagnosis not present

## 2016-08-15 DIAGNOSIS — C3412 Malignant neoplasm of upper lobe, left bronchus or lung: Secondary | ICD-10-CM | POA: Diagnosis not present

## 2016-08-15 DIAGNOSIS — R5383 Other fatigue: Secondary | ICD-10-CM | POA: Diagnosis not present

## 2016-08-15 DIAGNOSIS — K769 Liver disease, unspecified: Secondary | ICD-10-CM

## 2016-08-15 LAB — CBC WITH DIFFERENTIAL/PLATELET
BASO%: 0.1 % (ref 0.0–2.0)
Basophils Absolute: 0 10*3/uL (ref 0.0–0.1)
EOS%: 1.8 % (ref 0.0–7.0)
Eosinophils Absolute: 0.2 10*3/uL (ref 0.0–0.5)
HEMATOCRIT: 37.2 % (ref 34.8–46.6)
HGB: 12.3 g/dL (ref 11.6–15.9)
LYMPH#: 2.1 10*3/uL (ref 0.9–3.3)
LYMPH%: 21.7 % (ref 14.0–49.7)
MCH: 28.8 pg (ref 25.1–34.0)
MCHC: 33.1 g/dL (ref 31.5–36.0)
MCV: 87.1 fL (ref 79.5–101.0)
MONO#: 0.8 10*3/uL (ref 0.1–0.9)
MONO%: 8.4 % (ref 0.0–14.0)
NEUT%: 68 % (ref 38.4–76.8)
NEUTROS ABS: 6.7 10*3/uL — AB (ref 1.5–6.5)
Platelets: 182 10*3/uL (ref 145–400)
RBC: 4.27 10*6/uL (ref 3.70–5.45)
RDW: 13.9 % (ref 11.2–14.5)
WBC: 9.9 10*3/uL (ref 3.9–10.3)

## 2016-08-15 LAB — COMPREHENSIVE METABOLIC PANEL
ALBUMIN: 3.8 g/dL (ref 3.5–5.0)
ALK PHOS: 81 U/L (ref 40–150)
ALT: 18 U/L (ref 0–55)
AST: 22 U/L (ref 5–34)
Anion Gap: 10 mEq/L (ref 3–11)
BUN: 17.5 mg/dL (ref 7.0–26.0)
CO2: 25 mEq/L (ref 22–29)
CREATININE: 0.9 mg/dL (ref 0.6–1.1)
Calcium: 9.5 mg/dL (ref 8.4–10.4)
Chloride: 106 mEq/L (ref 98–109)
EGFR: 68 mL/min/{1.73_m2} — ABNORMAL LOW (ref >90)
GLUCOSE: 86 mg/dL (ref 70–140)
POTASSIUM: 4.3 meq/L (ref 3.5–5.1)
SODIUM: 141 meq/L (ref 136–145)
TOTAL PROTEIN: 7.6 g/dL (ref 6.4–8.3)
Total Bilirubin: 0.46 mg/dL (ref 0.20–1.20)

## 2016-08-15 NOTE — Telephone Encounter (Signed)
Appointments scheduled per 3.20.18 LOS. Patient given AVS report and calendars with future scheduled appointments.  °

## 2016-08-15 NOTE — Progress Notes (Signed)
    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  PRIOR THERAPY: 1) Tarceva 150 mg by mouth daily. Started 11/25/2013. Status post 5 months of treatment discontinued today secondary to intolerance with persistent paronychia. 2) Tarceva 100 mg by mouth daily. Started 06/03/2014. Status post 8 months of treatment.   CURRENT THERAPY: Tagrisso 80 mg by mouth daily status post 17 months of treatment. First dose was given 03/04/2015.  INTERVAL HISTORY: Debra Hodges 65 y.o. female returns to the clinic today for follow-up visit accompanied by her husband. The patient is feeling fine today with no specific complaints except for mild fatigue. She is tolerating her current treatment with Tagrisso fairly well. She denied having any chest pain, shortness of breath, cough or hemoptysis. She has no fever or chills. She denied having any weight loss or night sweats. She has no significant skin rash or diarrhea. She is here today for reevaluation with repeat blood work.   MEDICAL HISTORY: Past Medical History:  Diagnosis Date  . High blood pressure   . Liver lesion 06/26/2016  . Non-small cell carcinoma of lung, stage 4 ([facility]) dx'd 10/2013    ALLERGIES:  is allergic to penicillins.  MEDICATIONS:  Current Outpatient Prescriptions  Medication Sig Dispense Refill  . acetaminophen (TYLENOL) 325 MG tablet Take 650 mg by mouth every 6 (six) hours as needed. Reported on 06/15/2015    . calcium carbonate (OS-CAL) 600 MG TABS tablet Take 600 mg by mouth daily.     . CLIMARA 0.1  MG/24HR patch 1 patch once a week.    . levothyroxine (SYNTHROID, LEVOTHROID) 25 MCG tablet Take 25 mcg by mouth daily before breakfast.    . loperamide (IMODIUM) 2 MG capsule Take by mouth as needed for diarrhea or loose stools. Reported on 11/22/2015    . metroNIDAZOLE (METROCREAM) 0.75 % cream Apply 1 application topically daily. Reported on 11/22/2015    . Multiple Vitamins-Calcium (ONE-A-DAY WOMENS PO) Take 1 tablet by mouth daily.    . osimertinib mesylate (TAGRISSO) 80 MG tablet Take 1 tablet (80 mg total) by mouth daily. rx called to [place] and me. 05/18/16 30 tablet 2  . potassium chloride SA (K-DUR,KLOR-CON) 20 MEQ tablet 1 tablet 2 (two) times daily.    . Sodium Fluoride (CLINPRO 5000) 1.1 % PSTE Place 1 application onto teeth 3 (three) times daily.    . spironolactone (ALDACTONE) 25 MG tablet 1 tablet daily.    . vitamin C (ASCORBIC ACID) 500 MG tablet Take 500 mg by mouth daily.     No current facility-administered medications for this visit.     SURGICAL HISTORY:  Past Surgical History:  Procedure Laterality Date  . ABDOMINAL HYSTERECTOMY    . BREAST BIOPSY     Right breast  . VIDEO BRONCHOSCOPY Bilateral 10/27/2013   Procedure: VIDEO BRONCHOSCOPY WITH FLUORO;  Surgeon: Robert S Byrum, MD;  Location: [facility];  Service: Cardiopulmonary;  Laterality: Bilateral;    REVIEW OF SYSTEMS:  A comprehensive review of systems was negative except for: Constitutional: positive for fatigue   PHYSICAL EXAMINATION: General appearance: alert,   cooperative, fatigued and no distress Head: Normocephalic, without obvious abnormality, atraumatic Neck: no adenopathy, no JVD, supple, symmetrical, trachea midline and thyroid not enlarged, symmetric, no tenderness/mass/nodules Lymph nodes: Cervical, supraclavicular, and axillary nodes normal. Resp: clear to auscultation bilaterally Back: symmetric, no curvature. ROM normal. No CVA tenderness. Cardio: regular rate and rhythm, S1, S2 normal, no murmur,  click, rub or gallop GI: soft, non-tender; bowel sounds normal; no masses,  no organomegaly Extremities: extremities normal, atraumatic, no cyanosis or edema  ECOG PERFORMANCE STATUS: 0 - Asymptomatic  Blood pressure (!) 145/60, pulse 87, temperature 98.5 F (36.9 C), temperature source Oral, resp. rate 18, height 5' 8" (1.727 m), weight 213 lb 8 oz (96.8 kg), SpO2 100 %.  LABORATORY DATA: Lab Results  Component Value Date   WBC 9.9 08/15/2016   HGB 12.3 08/15/2016   HCT 37.2 08/15/2016   MCV 87.1 08/15/2016   PLT 182 08/15/2016      Chemistry      Component Value Date/Time   NA 140 07/14/2016 1446   K 3.7 07/14/2016 1446   CO2 24 07/14/2016 1446   BUN 11.6 07/14/2016 1446   CREATININE 0.8 07/14/2016 1446      Component Value Date/Time   CALCIUM 9.4 07/14/2016 1446   ALKPHOS 87 07/14/2016 1446   AST 25 07/14/2016 1446   ALT 20 07/14/2016 1446   BILITOT 0.40 07/14/2016 1446       RADIOGRAPHIC STUDIES: No results found.  ASSESSMENT AND PLAN:  This is a very pleasant 66 years old white female with a stage IV non-small cell lung cancer, adenocarcinoma and positive EGFR mutation in exon 21 diagnosed in May 2015, status post treatment with Tarceva for 13 months discontinued secondary to disease progression and development of T790M resistant mutation. The patient is currently on treatment with Tagrisso 80 mg by mouth daily status post 17 months of treatment and tolerating the treatment well. I recommended for her to continue her current treatment with Tagrisso at the same dose. I will see her back for follow-up visit in one month's for reevaluation with repeat blood work. She was advised to call immediately if she has any concerning symptoms in the interval. The patient voices understanding of current disease status and treatment options and is in agreement with the current care plan.  All questions were answered. The patient knows to call the clinic with any problems,  questions or concerns. We can certainly see the patient much sooner if necessary. I spent 10 minutes counseling the patient face to face. The total time spent in the appointment was 15 minutes.  Disclaimer: This note was dictated with voice recognition software. Similar sounding words can inadvertently be transcribed and may not be corrected upon review.

## 2016-09-14 ENCOUNTER — Other Ambulatory Visit: Payer: Self-pay | Admitting: *Deleted

## 2016-09-14 DIAGNOSIS — C3412 Malignant neoplasm of upper lobe, left bronchus or lung: Secondary | ICD-10-CM

## 2016-09-14 MED ORDER — OSIMERTINIB MESYLATE 80 MG PO TABS: 80.0000 mg | ORAL_TABLET | Freq: Every day | ORAL | 2 refills | Status: DC

## 2016-09-18 ENCOUNTER — Telehealth: Payer: Self-pay | Admitting: Internal Medicine

## 2016-09-18 ENCOUNTER — Ambulatory Visit (HOSPITAL_BASED_OUTPATIENT_CLINIC_OR_DEPARTMENT_OTHER): Payer: Medicare Other | Admitting: Internal Medicine

## 2016-09-18 ENCOUNTER — Encounter: Payer: Self-pay | Admitting: Internal Medicine

## 2016-09-18 ENCOUNTER — Other Ambulatory Visit (HOSPITAL_BASED_OUTPATIENT_CLINIC_OR_DEPARTMENT_OTHER): Payer: Medicare Other

## 2016-09-18 VITALS — BP 131/67 | HR 74 | Temp 97.6°F | Resp 18 | Ht 68.0 in | Wt 215.9 lb

## 2016-09-18 DIAGNOSIS — C3412 Malignant neoplasm of upper lobe, left bronchus or lung: Secondary | ICD-10-CM

## 2016-09-18 DIAGNOSIS — C787 Secondary malignant neoplasm of liver and intrahepatic bile duct: Secondary | ICD-10-CM

## 2016-09-18 DIAGNOSIS — Z5111 Encounter for antineoplastic chemotherapy: Secondary | ICD-10-CM

## 2016-09-18 DIAGNOSIS — C3492 Malignant neoplasm of unspecified part of left bronchus or lung: Secondary | ICD-10-CM

## 2016-09-18 LAB — CBC WITH DIFFERENTIAL/PLATELET
BASO%: 0.1 % (ref 0.0–2.0)
BASOS ABS: 0 10*3/uL (ref 0.0–0.1)
EOS%: 2.1 % (ref 0.0–7.0)
Eosinophils Absolute: 0.2 10*3/uL (ref 0.0–0.5)
HEMATOCRIT: 36.7 % (ref 34.8–46.6)
HEMOGLOBIN: 12.3 g/dL (ref 11.6–15.9)
LYMPH#: 2.1 10*3/uL (ref 0.9–3.3)
LYMPH%: 26.5 % (ref 14.0–49.7)
MCH: 29.3 pg (ref 25.1–34.0)
MCHC: 33.6 g/dL (ref 31.5–36.0)
MCV: 87.2 fL (ref 79.5–101.0)
MONO#: 0.7 10*3/uL (ref 0.1–0.9)
MONO%: 8.5 % (ref 0.0–14.0)
NEUT#: 5 10*3/uL (ref 1.5–6.5)
NEUT%: 62.8 % (ref 38.4–76.8)
Platelets: 193 10*3/uL (ref 145–400)
RBC: 4.21 10*6/uL (ref 3.70–5.45)
RDW: 14 % (ref 11.2–14.5)
WBC: 8 10*3/uL (ref 3.9–10.3)

## 2016-09-18 LAB — COMPREHENSIVE METABOLIC PANEL
ALBUMIN: 3.8 g/dL (ref 3.5–5.0)
ALK PHOS: 77 U/L (ref 40–150)
ALT: 17 U/L (ref 0–55)
AST: 20 U/L (ref 5–34)
Anion Gap: 11 mEq/L (ref 3–11)
BUN: 16.6 mg/dL (ref 7.0–26.0)
CHLORIDE: 107 meq/L (ref 98–109)
CO2: 24 mEq/L (ref 22–29)
CREATININE: 0.9 mg/dL (ref 0.6–1.1)
Calcium: 9.4 mg/dL (ref 8.4–10.4)
EGFR: 67 mL/min/{1.73_m2} — ABNORMAL LOW (ref >90)
GLUCOSE: 87 mg/dL (ref 70–140)
POTASSIUM: 4.4 meq/L (ref 3.5–5.1)
SODIUM: 143 meq/L (ref 136–145)
Total Bilirubin: 0.37 mg/dL (ref 0.20–1.20)
Total Protein: 7.6 g/dL (ref 6.4–8.3)

## 2016-09-18 NOTE — Telephone Encounter (Signed)
Gave patient avs report and appointments for May. Central radiology will call re scan.

## 2016-09-18 NOTE — Progress Notes (Signed)
[facility] [phone]   [phone]  OFFICE PROGRESS NOTE  Pcp Not In System No address on file  DIAGNOSIS: Stage IV (T1b, N3, M1b) non-small cell lung cancer, adenocarcinoma with positive EGFR mutation in exon 21 (L858R) and recently found to have T790M resistant mutation, presented with left upper lobe lung mass in addition to left hilar and bilateral mediastinal lymphadenopathy as well as liver metastasis diagnosed in May of 2015.  PRIOR THERAPY: 1) Tarceva 150 mg by mouth daily. Started 11/25/2013. Status post 5 months of treatment discontinued today secondary to intolerance with persistent paronychia. 2) Tarceva 100 mg by mouth daily. Started 06/03/2014. Status post 8 months of treatment.   CURRENT THERAPY: Tagrisso 80 mg by mouth daily status post 18 months of treatment. First dose was given 03/04/2015.  INTERVAL HISTORY: Debra Hodges 66 y.o. female returns to the clinic today for follow-up visit accompanied by her husband. The patient is feeling fine today with no specific complaints except for feeling cold at times. She denied having any chest pain, shortness of breath, cough or hemoptysis. She denied having any fever or chills. She has no nausea, vomiting, diarrhea or constipation. The patient has no significant weight loss or night sweats. She is here today for evaluation and repeat blood work.   MEDICAL HISTORY: Past Medical History:  Diagnosis Date  . High blood pressure   . Liver lesion 06/26/2016  . Non-small cell carcinoma of lung, stage 4 ([place]) dx'd 10/2013    ALLERGIES:  is allergic to penicillins.  MEDICATIONS:  Current Outpatient Prescriptions  Medication Sig Dispense Refill  . acetaminophen (TYLENOL) 325 MG tablet Take 650 mg by mouth every 6 (six) hours as needed. Reported on 06/15/2015    . calcium carbonate (OS-CAL) 600 MG TABS tablet Take 600 mg by mouth daily.     Marland Kitchen CLIMARA 0.1 MG/24HR patch 1 patch once a week.      . levothyroxine (SYNTHROID, LEVOTHROID) 25 MCG tablet Take 25 mcg by mouth daily before breakfast.    . loperamide (IMODIUM) 2 MG capsule Take by mouth as needed for diarrhea or loose stools. Reported on 11/22/2015    . metroNIDAZOLE (METROCREAM) 0.75 % cream Apply 1 application topically daily. Reported on 11/22/2015    . Multiple Vitamins-Calcium (ONE-A-DAY WOMENS PO) Take 1 tablet by mouth daily.    Marland Kitchen osimertinib mesylate (TAGRISSO) 80 MG tablet Take 1 tablet (80 mg total) by mouth daily. rx called to [place] and me. 05/18/16 30 tablet 2  . potassium chloride SA (K-DUR,KLOR-CON) 20 MEQ tablet 1 tablet 2 (two) times daily.    . Sodium Fluoride (CLINPRO 5000) 1.1 % PSTE Place 1 application onto teeth 3 (three) times daily.    Marland Kitchen spironolactone (ALDACTONE) 25 MG tablet 1 tablet daily.    . vitamin C (ASCORBIC ACID) 500 MG tablet Take 500 mg by mouth daily.     No current facility-administered medications for this visit.     SURGICAL HISTORY:  Past Surgical History:  Procedure Laterality Date  . ABDOMINAL HYSTERECTOMY    . BREAST BIOPSY     Right breast  . VIDEO BRONCHOSCOPY Bilateral 10/27/2013   Procedure: VIDEO BRONCHOSCOPY WITH FLUORO;  Surgeon: Collene Gobble, MD;  Location: [facility];  Service: Cardiopulmonary;  Laterality: Bilateral;    REVIEW OF SYSTEMS:  A comprehensive review of systems was negative except for: Constitutional: positive for fatigue   PHYSICAL EXAMINATION: General appearance: alert, cooperative and no distress Head:  Normocephalic, without obvious abnormality, atraumatic Neck: no adenopathy, no JVD, supple, symmetrical, trachea midline and thyroid not enlarged, symmetric, no tenderness/mass/nodules Lymph nodes: Cervical, supraclavicular, and axillary nodes normal. Resp: clear to auscultation bilaterally Back: symmetric, no curvature. ROM normal. No CVA tenderness. Cardio: regular rate and rhythm, S1, S2 normal, no murmur, click, rub or gallop GI: soft, non-tender;  bowel sounds normal; no masses,  no organomegaly Extremities: extremities normal, atraumatic, no cyanosis or edema  ECOG PERFORMANCE STATUS: 0 - Asymptomatic  Blood pressure 131/67, pulse 74, temperature 97.6 F (36.4 C), temperature source Oral, resp. rate 18, height _0  (1.727 m), weight 215 lb 14.4 oz (97.9 kg), SpO2 100 %.  LABORATORY DATA: Lab Results  Component Value Date   WBC 8.0 09/18/2016   HGB 12.3 09/18/2016   HCT 36.7 09/18/2016   MCV 87.2 09/18/2016   PLT 193 09/18/2016      Chemistry      Component Value Date/Time   NA 141 08/15/2016 0850   K 4.3 08/15/2016 0850   CO2 25 08/15/2016 0850   BUN 17.5 08/15/2016 0850   CREATININE 0.9 08/15/2016 0850      Component Value Date/Time   CALCIUM 9.5 08/15/2016 0850   ALKPHOS 81 08/15/2016 0850   AST 22 08/15/2016 0850   ALT 18 08/15/2016 0850   BILITOT 0.46 08/15/2016 0850       RADIOGRAPHIC STUDIES: No results found.  ASSESSMENT AND PLAN:  This is a very pleasant 66 years old white female with a stage IV non-small cell lung cancer, adenocarcinoma with positive EGFR mutation at exon 21 diagnosed in May 2015 status post treatment with Tarceva for 13 months discontinued secondary to disease progression and development of T790M resistant mutation. The patient is currently on treatment with Tagrisso 80 mg by mouth daily status post 18 months of treatment. She has been tolerating her treatment well with no significant adverse effects. I recommended for her to continue her treatment with Tagrisso at the same dose. I will see her back for follow-up visit in one month's for evaluation after repeating CT scan of the chest, abdomen and pelvis for restaging of her disease. The patient was advised to call immediately if she has any concerning symptoms in the interval. The patient voices understanding of current disease status and treatment options and is in agreement with the current care plan. All questions were answered.  The patient knows to call the clinic with any problems, questions or concerns. We can certainly see the patient much sooner if necessary. I spent 10 minutes counseling the patient face to face. The total time spent in the appointment was 15 minutes.  Disclaimer: This note was dictated with voice recognition software. Similar sounding words can inadvertently be transcribed and may not be corrected upon review.

## 2016-10-04 DIAGNOSIS — C3490 Malignant neoplasm of unspecified part of unspecified bronchus or lung: Secondary | ICD-10-CM | POA: Diagnosis not present

## 2016-10-04 DIAGNOSIS — K7689 Other specified diseases of liver: Secondary | ICD-10-CM | POA: Diagnosis not present

## 2016-10-04 DIAGNOSIS — E039 Hypothyroidism, unspecified: Secondary | ICD-10-CM | POA: Diagnosis not present

## 2016-10-16 ENCOUNTER — Ambulatory Visit (HOSPITAL_COMMUNITY)
Admission: RE | Admit: 2016-10-16 | Discharge: 2016-10-16 | Disposition: A | Discharge status: Home / Self Care | Payer: Medicare Other | Source: Ambulatory Visit | Attending: Internal Medicine | Admitting: Internal Medicine

## 2016-10-16 ENCOUNTER — Other Ambulatory Visit (HOSPITAL_BASED_OUTPATIENT_CLINIC_OR_DEPARTMENT_OTHER): Payer: Medicare Other

## 2016-10-16 DIAGNOSIS — C3492 Malignant neoplasm of unspecified part of left bronchus or lung: Secondary | ICD-10-CM

## 2016-10-16 DIAGNOSIS — C787 Secondary malignant neoplasm of liver and intrahepatic bile duct: Secondary | ICD-10-CM

## 2016-10-16 DIAGNOSIS — I700 Atherosclerosis of aorta: Secondary | ICD-10-CM | POA: Diagnosis not present

## 2016-10-16 DIAGNOSIS — Z5111 Encounter for antineoplastic chemotherapy: Secondary | ICD-10-CM

## 2016-10-16 DIAGNOSIS — C3412 Malignant neoplasm of upper lobe, left bronchus or lung: Secondary | ICD-10-CM

## 2016-10-16 DIAGNOSIS — C3490 Malignant neoplasm of unspecified part of unspecified bronchus or lung: Secondary | ICD-10-CM | POA: Diagnosis not present

## 2016-10-16 LAB — CBC WITH DIFFERENTIAL/PLATELET
BASO%: 0 % (ref 0.0–2.0)
BASOS ABS: 0 10*3/uL (ref 0.0–0.1)
EOS ABS: 0.1 10*3/uL (ref 0.0–0.5)
EOS%: 1.5 % (ref 0.0–7.0)
HEMATOCRIT: 38.2 % (ref 34.8–46.6)
HGB: 12.7 g/dL (ref 11.6–15.9)
LYMPH#: 2.4 10*3/uL (ref 0.9–3.3)
LYMPH%: 30 % (ref 14.0–49.7)
MCH: 29.2 pg (ref 25.1–34.0)
MCHC: 33.2 g/dL (ref 31.5–36.0)
MCV: 87.8 fL (ref 79.5–101.0)
MONO#: 0.7 10*3/uL (ref 0.1–0.9)
MONO%: 8.5 % (ref 0.0–14.0)
NEUT#: 4.9 10*3/uL (ref 1.5–6.5)
NEUT%: 60 % (ref 38.4–76.8)
PLATELETS: 177 10*3/uL (ref 145–400)
RBC: 4.35 10*6/uL (ref 3.70–5.45)
RDW: 14 % (ref 11.2–14.5)
WBC: 8.1 10*3/uL (ref 3.9–10.3)

## 2016-10-16 LAB — COMPREHENSIVE METABOLIC PANEL
ALT: 20 U/L (ref 0–55)
ANION GAP: 11 meq/L (ref 3–11)
AST: 26 U/L (ref 5–34)
Albumin: 4.2 g/dL (ref 3.5–5.0)
Alkaline Phosphatase: 82 U/L (ref 40–150)
BUN: 15 mg/dL (ref 7.0–26.0)
CALCIUM: 9.8 mg/dL (ref 8.4–10.4)
CHLORIDE: 104 meq/L (ref 98–109)
CO2: 26 mEq/L (ref 22–29)
CREATININE: 0.9 mg/dL (ref 0.6–1.1)
EGFR: 65 mL/min/{1.73_m2} — ABNORMAL LOW (ref >90)
Glucose: 87 mg/dl (ref 70–140)
Potassium: 4.2 mEq/L (ref 3.5–5.1)
Sodium: 141 mEq/L (ref 136–145)
Total Bilirubin: 0.49 mg/dL (ref 0.20–1.20)
Total Protein: 8.1 g/dL (ref 6.4–8.3)

## 2016-10-16 MED ORDER — IOPAMIDOL (ISOVUE-300) INJECTION 61%
INTRAVENOUS | Status: AC
  Filled 2016-10-16: qty 100

## 2016-10-16 MED ORDER — IOPAMIDOL (ISOVUE-300) INJECTION 61%
100.0000 mL | Freq: Once | INTRAVENOUS | Status: AC | PRN
  Administered 2016-10-16: 100 mL via INTRAVENOUS

## 2016-10-19 ENCOUNTER — Encounter: Payer: Self-pay | Admitting: Internal Medicine

## 2016-10-19 ENCOUNTER — Ambulatory Visit (HOSPITAL_BASED_OUTPATIENT_CLINIC_OR_DEPARTMENT_OTHER): Payer: Medicare Other | Admitting: Internal Medicine

## 2016-10-19 ENCOUNTER — Telehealth: Payer: Self-pay | Admitting: Internal Medicine

## 2016-10-19 VITALS — BP 116/58 | HR 83 | Temp 97.0°F | Resp 18 | Ht 68.0 in | Wt 218.2 lb

## 2016-10-19 DIAGNOSIS — E039 Hypothyroidism, unspecified: Secondary | ICD-10-CM | POA: Diagnosis not present

## 2016-10-19 DIAGNOSIS — C3412 Malignant neoplasm of upper lobe, left bronchus or lung: Secondary | ICD-10-CM

## 2016-10-19 DIAGNOSIS — Z5111 Encounter for antineoplastic chemotherapy: Secondary | ICD-10-CM

## 2016-10-19 DIAGNOSIS — K769 Liver disease, unspecified: Secondary | ICD-10-CM | POA: Diagnosis not present

## 2016-10-19 DIAGNOSIS — R918 Other nonspecific abnormal finding of lung field: Secondary | ICD-10-CM

## 2016-10-19 DIAGNOSIS — C3492 Malignant neoplasm of unspecified part of left bronchus or lung: Secondary | ICD-10-CM

## 2016-10-19 NOTE — Progress Notes (Signed)
[place] [phone]   [phone]) [phone]  OFFICE PROGRESS NOTE  System, Pcp Not In No address on file  DIAGNOSIS: Stage IV (T1b, N3, M1b) non-small cell lung cancer, adenocarcinoma with positive EGFR mutation in exon 21 (L858R) and recently found to have T790M resistant mutation, presented with left upper lobe lung mass in addition to left hilar and bilateral mediastinal lymphadenopathy as well as liver metastasis diagnosed in May of 2015.  PRIOR THERAPY: 1) Tarceva 150 mg by mouth daily. Started 11/25/2013. Status post 5 months of treatment discontinued today secondary to intolerance with persistent paronychia. 2) Tarceva 100 mg by mouth daily. Started 06/03/2014. Status post 8 months of treatment.   CURRENT THERAPY: Tagrisso 80 mg by mouth daily status post 19 months of treatment. First dose was given 03/04/2015.  INTERVAL HISTORY: Debra Hodges 66 y.o. female returns to the clinic today for follow-up visit accompanied by her husband. The patient is feeling fine today with no specific complaints. She has been tolerating her treatment with Tagrisso fairly well with no significant adverse effects. She denied having any weight loss or night sweats. She has no nausea, vomiting, diarrhea or constipation. The patient denied having any fever or chills. She has no chest pain, shortness of breath, cough or hemoptysis. She had repeat CT scan of the chest, abdomen and pelvis performed recently and she is here for evaluation and discussion of her scan results.   MEDICAL HISTORY: Past Medical History:  Diagnosis Date  . High blood pressure   . Liver lesion 06/26/2016  . Non-small cell carcinoma of lung, stage 4 ([place]) dx'd 10/2013    ALLERGIES:  is allergic to penicillins.  MEDICATIONS:  Current Outpatient Prescriptions  Medication Sig Dispense Refill  . acetaminophen (TYLENOL) 325 MG tablet Take 650 mg by mouth every 6 (six) hours as needed. Reported on  06/15/2015    . calcium carbonate (OS-CAL) 600 MG TABS tablet Take 600 mg by mouth daily.     Marland Kitchen CLIMARA 0.1 MG/24HR patch 1 patch once a week.    . levothyroxine (SYNTHROID, LEVOTHROID) 25 MCG tablet Take 25 mcg by mouth daily before breakfast.    . metroNIDAZOLE (METROCREAM) 0.75 % cream Apply 1 application topically daily. Reported on 11/22/2015    . Multiple Vitamins-Calcium (ONE-A-DAY WOMENS PO) Take 1 tablet by mouth daily.    Marland Kitchen osimertinib mesylate (TAGRISSO) 80 MG tablet Take 1 tablet (80 mg total) by mouth daily. rx called to [facility] and me. 05/18/16 30 tablet 2  . potassium chloride SA (K-DUR,KLOR-CON) 20 MEQ tablet 1 tablet 2 (two) times daily.    . Sodium Fluoride (CLINPRO 5000) 1.1 % PSTE Place 1 application onto teeth 3 (three) times daily.    Marland Kitchen spironolactone (ALDACTONE) 25 MG tablet 1 tablet daily.    . vitamin C (ASCORBIC ACID) 500 MG tablet Take 500 mg by mouth daily.    Marland Kitchen loperamide (IMODIUM) 2 MG capsule Take by mouth as needed for diarrhea or loose stools. Reported on 11/22/2015     No current facility-administered medications for this visit.     SURGICAL HISTORY:  Past Surgical History:  Procedure Laterality Date  . ABDOMINAL HYSTERECTOMY    . BREAST BIOPSY     Right breast  . VIDEO BRONCHOSCOPY Bilateral 10/27/2013   Procedure: VIDEO BRONCHOSCOPY WITH FLUORO;  Surgeon: Collene Gobble, MD;  Location: [facility];  Service: Cardiopulmonary;  Laterality: Bilateral;    REVIEW OF SYSTEMS:  Constitutional: negative Eyes:  negative Ears, nose, mouth, throat, and face: negative Respiratory: negative Cardiovascular: negative Gastrointestinal: negative Genitourinary:negative Integument/breast: negative Hematologic/lymphatic: negative Musculoskeletal:positive for arthralgias Neurological: negative Behavioral/Psych: negative Endocrine: negative Allergic/Immunologic: negative   PHYSICAL EXAMINATION: General appearance: alert, cooperative and no distress Head: Normocephalic,  without obvious abnormality, atraumatic Neck: no adenopathy, no JVD, supple, symmetrical, trachea midline and thyroid not enlarged, symmetric, no tenderness/mass/nodules Lymph nodes: Cervical, supraclavicular, and axillary nodes normal. Resp: clear to auscultation bilaterally Back: symmetric, no curvature. ROM normal. No CVA tenderness. Cardio: regular rate and rhythm, S1, S2 normal, no murmur, click, rub or gallop GI: soft, non-tender; bowel sounds normal; no masses,  no organomegaly Extremities: extremities normal, atraumatic, no cyanosis or edema Neurologic: Alert and oriented X 3, normal strength and tone. Normal symmetric reflexes. Normal coordination and gait  ECOG PERFORMANCE STATUS: 0 - Asymptomatic  Blood pressure (!) 116/58, pulse 83, temperature 97 F (36.1 C), temperature source Oral, resp. rate 18, height '5\' 8"'$  (1.727 m), weight 218 lb 3.2 oz (99 kg), SpO2 100 %.  LABORATORY DATA: Lab Results  Component Value Date   WBC 8.1 10/16/2016   HGB 12.7 10/16/2016   HCT 38.2 10/16/2016   MCV 87.8 10/16/2016   PLT 177 10/16/2016      Chemistry      Component Value Date/Time   NA 141 10/16/2016 1608   K 4.2 10/16/2016 1608   CO2 26 10/16/2016 1608   BUN 15.0 10/16/2016 1608   CREATININE 0.9 10/16/2016 1608      Component Value Date/Time   CALCIUM 9.8 10/16/2016 1608   ALKPHOS 82 10/16/2016 1608   AST 26 10/16/2016 1608   ALT 20 10/16/2016 1608   BILITOT 0.49 10/16/2016 1608       RADIOGRAPHIC STUDIES: Ct Chest W Contrast  Result Date: 10/17/2016 CLINICAL DATA:  Followup lung cancer EXAM: CT CHEST, ABDOMEN, AND PELVIS WITH CONTRAST TECHNIQUE: Multidetector CT imaging of the chest, abdomen and pelvis was performed following the standard protocol during bolus administration of intravenous contrast. CONTRAST:  134m ISOVUE-300 IOPAMIDOL (ISOVUE-300) INJECTION 61% COMPARISON:  06/23/2016 FINDINGS: CT CHEST FINDINGS Cardiovascular: Normal heart size. No pericardial  effusion. Aortic atherosclerosis. Mediastinum/Nodes: No enlarged mediastinal, hilar, or axillary lymph nodes. Thyroid gland, trachea, and esophagus demonstrate no significant findings. Lungs/Pleura: No pleural effusion. Lingular lesion measures 2.8 x 1.8 cm, image 57 of series 6. Stable from previous exam. Musculoskeletal: Small nodule within the right upper lobe measures 4 mm. Unchanged from previous exam, image 40 of series 6. CT ABDOMEN PELVIS FINDINGS Hepatobiliary: There is a focal area of low attenuation within segment 8 of the liver measuring 3.1 x 2.1 cm, image number 35 of series 5. On MRI from 07/14/2016 this measured 2.8 x 1.3 cm. On 06/09/2015 this measured 2.1 x 1.4 cm. The gallbladder appears normal. No biliary dilatation. Pancreas: Unremarkable. No pancreatic ductal dilatation or surrounding inflammatory changes. Spleen: Stable 8 mm low-attenuation structure within the spleen measuring 9 mm, image 53 of series 2. Adrenals/Urinary Tract: Normal adrenal glands. The right kidney appears normal. There is a small stone within the inferior pole of the left kidney which measures 4.3 mm, image 68 of series 2. The urinary bladder appears normal. Stomach/Bowel: The stomach is normal. The small bowel loops have a normal course and caliber. No obstruction. Vascular/Lymphatic: Aortic atherosclerosis. No aneurysm. No upper abdominal or pelvic adenopathy. No inguinal adenopathy identified. Reproductive: Previous hysterectomy.  No adnexal mass. Other: No free fluid or fluid collections identified. Musculoskeletal: Degenerative disc disease noted within the  lumbar spine. No aggressive lytic or sclerotic bone lesions. IMPRESSION: 1. Stable appearance of the chest. Lingular is not significantly changed in the interval. No new or progressive disease identified within the chest. 2. There is been slight increase in size of indeterminate liver lesion within segment 8 of the liver. Biopsy may be indicated to rule out  metastatic disease or primary liver malignancy. 3.  Aortic Atherosclerosis (ICD10-I70.0). Electronically Signed   By: Signa Kell M.D.   On: 10/17/2016 09:37   Ct Abdomen Pelvis W Contrast  Result Date: 10/17/2016 CLINICAL DATA:  Followup lung cancer EXAM: CT CHEST, ABDOMEN, AND PELVIS WITH CONTRAST TECHNIQUE: Multidetector CT imaging of the chest, abdomen and pelvis was performed following the standard protocol during bolus administration of intravenous contrast. CONTRAST:  ISOVUE-300 IOPAMIDOL (ISOVUE-300) INJECTION 61% COMPARISON:  06/23/2016 FINDINGS: CT CHEST FINDINGS Cardiovascular: Normal heart size. No pericardial effusion. Aortic atherosclerosis. Mediastinum/Nodes: No enlarged mediastinal, hilar, or axillary lymph nodes. Thyroid gland, trachea, and esophagus demonstrate no significant findings. Lungs/Pleura: No pleural effusion. Lingular lesion measures 2.8 x 1.8 cm, image 57 of series 6. Stable from previous exam. Musculoskeletal: Small nodule within the right upper lobe measures 4 mm. Unchanged from previous exam, image 40 of series 6. CT ABDOMEN PELVIS FINDINGS Hepatobiliary: There is a focal area of low attenuation within segment 8 of the liver measuring 3.1 x 2.1 cm, image number 35 of series 5. On MRI from 07/14/2016 this measured 2.8 x 1.3 cm. On 06/09/2015 this measured 2.1 x 1.4 cm. The gallbladder appears normal. No biliary dilatation. Pancreas: Unremarkable. No pancreatic ductal dilatation or surrounding inflammatory changes. Spleen: Stable 8 mm low-attenuation structure within the spleen measuring 9 mm, image 53 of series 2. Adrenals/Urinary Tract: Normal adrenal glands. The right kidney appears normal. There is a small stone within the inferior pole of the left kidney which measures 4.3 mm, image 68 of series 2. The urinary bladder appears normal. Stomach/Bowel: The stomach is normal. The small bowel loops have a normal course and caliber. No obstruction. Vascular/Lymphatic: Aortic  atherosclerosis. No aneurysm. No upper abdominal or pelvic adenopathy. No inguinal adenopathy identified. Reproductive: Previous hysterectomy.  No adnexal mass. Other: No free fluid or fluid collections identified. Musculoskeletal: Degenerative disc disease noted within the lumbar spine. No aggressive lytic or sclerotic bone lesions. IMPRESSION: 1. Stable appearance of the chest. Lingular is not significantly changed in the interval. No new or progressive disease identified within the chest. 2. There is been slight increase in size of indeterminate liver lesion within segment 8 of the liver. Biopsy may be indicated to rule out metastatic disease or primary liver malignancy. 3.  Aortic Atherosclerosis (ICD10-I70.0). Electronically Signed   By: Signa Kell M.D.   On: 10/17/2016 09:37    ASSESSMENT AND PLAN:  This is a very pleasant 66 years old white female with a stage IV non-small cell lung cancer, adenocarcinoma with positive EGFR mutation in exon 21 diagnosed in May 2015 status post treatment with Tarceva for 13 months discontinued secondary to disease progression and development of T790M resistant mutation. The patient is currently on treatment with Tagrisso 80 mg by mouth daily status post 19 months and has been tolerating this treatment much better. She had repeat CT scan of the chest, abdomen and pelvis performed recently. I personally and independently reviewed the scan images and discuss the results with the patient today. Her scan showed no clear evidence for disease progression except for a slight increase in the size of the indeterminate  liver lesion. I recommended for the patient to continue her current treatment with Tagrisso and I will consider the patient for ultrasound-guided core biopsy of the suspicious liver lesion to rule out malignancy. For hypothyroidism, she will continue her current treatment with levothyroxine. The patient will come back for follow-up visit in one month's for  reevaluation with repeat blood work. She was advised to call immediately if she has any concerning symptoms in the interval. The patient voices understanding of current disease status and treatment options and is in agreement with the current care plan. All questions were answered. The patient knows to call the clinic with any problems, questions or concerns. We can certainly see the patient much sooner if necessary.  Disclaimer: This note was dictated with voice recognition software. Similar sounding words can inadvertently be transcribed and may not be corrected upon review.

## 2016-10-19 NOTE — Telephone Encounter (Signed)
Appointments scheduled per 10/19/16 los. Patient was given a copy of the AVS report and appointment schedule, per 10/19/16 los.

## 2016-10-26 ENCOUNTER — Other Ambulatory Visit: Payer: Self-pay | Admitting: Radiology

## 2016-10-27 ENCOUNTER — Ambulatory Visit (HOSPITAL_COMMUNITY)
Admission: RE | Admit: 2016-10-27 | Discharge: 2016-10-27 | Disposition: A | Discharge status: Home / Self Care | Payer: Medicare Other | Source: Ambulatory Visit | Attending: Internal Medicine | Admitting: Internal Medicine

## 2016-10-27 ENCOUNTER — Encounter (HOSPITAL_COMMUNITY): Payer: Self-pay

## 2016-10-27 DIAGNOSIS — C787 Secondary malignant neoplasm of liver and intrahepatic bile duct: Secondary | ICD-10-CM | POA: Insufficient documentation

## 2016-10-27 DIAGNOSIS — K769 Liver disease, unspecified: Secondary | ICD-10-CM | POA: Diagnosis present

## 2016-10-27 DIAGNOSIS — R160 Hepatomegaly, not elsewhere classified: Secondary | ICD-10-CM | POA: Diagnosis not present

## 2016-10-27 DIAGNOSIS — Z880 Allergy status to penicillin: Secondary | ICD-10-CM | POA: Diagnosis not present

## 2016-10-27 DIAGNOSIS — C3492 Malignant neoplasm of unspecified part of left bronchus or lung: Secondary | ICD-10-CM | POA: Diagnosis not present

## 2016-10-27 DIAGNOSIS — C3412 Malignant neoplasm of upper lobe, left bronchus or lung: Secondary | ICD-10-CM | POA: Insufficient documentation

## 2016-10-27 DIAGNOSIS — R918 Other nonspecific abnormal finding of lung field: Secondary | ICD-10-CM | POA: Insufficient documentation

## 2016-10-27 DIAGNOSIS — C3490 Malignant neoplasm of unspecified part of unspecified bronchus or lung: Secondary | ICD-10-CM | POA: Diagnosis not present

## 2016-10-27 DIAGNOSIS — Z5111 Encounter for antineoplastic chemotherapy: Secondary | ICD-10-CM

## 2016-10-27 LAB — APTT: aPTT: 25 seconds (ref 24–36)

## 2016-10-27 LAB — CBC
HCT: 37.2 % (ref 36.0–46.0)
Hemoglobin: 12.4 g/dL (ref 12.0–15.0)
MCH: 29 pg (ref 26.0–34.0)
MCHC: 33.3 g/dL (ref 30.0–36.0)
MCV: 87.1 fL (ref 78.0–100.0)
Platelets: 183 10*3/uL (ref 150–400)
RBC: 4.27 MIL/uL (ref 3.87–5.11)
RDW: 14.2 % (ref 11.5–15.5)
WBC: 9.3 10*3/uL (ref 4.0–10.5)

## 2016-10-27 LAB — PROTIME-INR
INR: 0.94
Prothrombin Time: 12.6 seconds (ref 11.4–15.2)

## 2016-10-27 MED ORDER — SODIUM CHLORIDE 0.9 % IV SOLN
INTRAVENOUS | Status: DC
  Administered 2016-10-27: 11:00:00 via INTRAVENOUS

## 2016-10-27 MED ORDER — FENTANYL CITRATE (PF) 100 MCG/2ML IJ SOLN
INTRAMUSCULAR | Status: AC | PRN
  Administered 2016-10-27 (×2): 50 ug via INTRAVENOUS

## 2016-10-27 MED ORDER — MIDAZOLAM HCL 2 MG/2ML IJ SOLN
INTRAMUSCULAR | Status: AC | PRN
  Administered 2016-10-27 (×2): 1 mg via INTRAVENOUS

## 2016-10-27 NOTE — H&P (Signed)
Chief Complaint: liver lesions with a history of Stage IV NSCLC  Referring Physician:Dr. Curt Bears  Supervising Physician: Corrie Mckusick  Patient Status: [facility] - Out-pt  HPI: Debra Hodges is a 66 y.o. female who has been diagnosed with stage IV NSCLC.  She is on oral chemotherapy.  She has never had IV chemo or radiation.  She had a follow up CT scan and this revealed a stable appearance of the chest, but a slightly increase in size of an indeterminate liver lesion.  A biopsy has been requested to determine if this is malignant or not.  The patient presents today for biopsy.  She has no complaints otherwise.  Of note, her husband fell at 0430 this am and fractured his hip and has been admitted for surgical repair.  Past Medical History:  Past Medical History:  Diagnosis Date  . High blood pressure   . Liver lesion 06/26/2016  . Non-small cell carcinoma of lung, stage 4 ([place]) dx'd 10/2013    Past Surgical History:  Past Surgical History:  Procedure Laterality Date  . ABDOMINAL HYSTERECTOMY    . BREAST BIOPSY     Right breast  . VIDEO BRONCHOSCOPY Bilateral 10/27/2013   Procedure: VIDEO BRONCHOSCOPY WITH FLUORO;  Surgeon: Collene Gobble, MD;  Location: [facility];  Service: Cardiopulmonary;  Laterality: Bilateral;    Family History:  Family History  Problem Relation Age of Onset  . High blood pressure Mother     Social History:  reports that she has never smoked. She has never used smokeless tobacco. She reports that she does not drink alcohol or use drugs.  Allergies:  Allergies  Allergen Reactions  . Penicillins     hives    Medications: Medications reviewed in epic.  Please HPI for pertinent positives, otherwise complete 10 system ROS negative.  Mallampati Score: MD Evaluation Airway: WNL Heart: WNL Abdomen: WNL Chest/ Lungs: WNL ASA  Classification: 2 Mallampati/Airway Score: One  Physical Exam: Temp (F)   97.9  97.9 (36.6)  06/01 1108  Pulse  Rate   91  91  06/01 1108  Resp   16  16  06/01 1108  BP   139/81  139/81  06/01 1108  SpO2 (%)   100  100  06/01 1108   General: pleasant, WD, WN white female who is laying in bed in NAD HEENT: head is normocephalic, atraumatic.  Sclera are noninjected.  PERRL.  Ears and nose without any masses or lesions.  Mouth is pink and moist Heart: regular, rate, and rhythm.  Normal s1,s2. No obvious murmurs, gallops, or rubs noted.  Palpable radial and pedal pulses bilaterally Lungs: CTAB, no wheezes, rhonchi, or rales noted.  Respiratory effort nonlabored Abd: soft, NT, ND, +BS, no masses, hernias, or organomegaly Psych: A&Ox3 with an appropriate affect.    Labs: WBC 9.3     RBC 4.27     Hemoglobin 12.4     HCT 37.2     Platelets 183     INR 0.94        Imaging: No results found.  Assessment/Plan 1. Stage IV non-small cell lung cancer, liver lesion We will plan to pursue liver lesion biopsy today to determine if this lesion is malignant or not.  Her labs and vitals have been reviewed. Risks and Benefits discussed with the patient including, but not limited to bleeding, infection, damage to adjacent structures or low yield requiring additional tests. All of the patient's questions were answered, patient  is agreeable to proceed. Consent signed and in chart.  Thank you for this interesting consult.  I greatly enjoyed meeting Debra Hodges and look forward to participating in their care.  A copy of this report was sent to the requesting provider on this date.  Electronically Signed: Henreitta Cea 10/27/2016, 12:23 PM   I spent a total of  30 Minutes   in face to face in clinical consultation, greater than 50% of which was counseling/coordinating care for lung cancer, indeterminate liver lesion

## 2016-10-27 NOTE — Procedures (Signed)
Interventional Radiology Procedure Note  Procedure: US guided biopsy of liver mass.   Complications: None Recommendations:  - 2 hour observation - ok to shower tomorrw - Do not submerge for 7 days - Routine care - follow up path   Signed,  Dulcy Fanny. Earleen Newport, DO

## 2016-10-27 NOTE — Discharge Instructions (Signed)
Moderate Conscious Sedation, Adult, Care After °These instructions provide you with information about caring for yourself after your procedure. Your health care provider may also give you more specific instructions. Your treatment has been planned according to current medical practices, but problems sometimes occur. Call your health care provider if you have any problems or questions after your procedure. °What can I expect after the procedure? °After your procedure, it is common: °· To feel sleepy for several hours. °· To feel clumsy and have poor balance for several hours. °· To have poor judgment for several hours. °· To vomit if you eat too soon. ° °Follow these instructions at home: °For at least 24 hours after the procedure: ° °· Do not: °? Participate in activities where you could fall or become injured. °? Drive. °? Use heavy machinery. °? Drink alcohol. °? Take sleeping pills or medicines that cause drowsiness. °? Make important decisions or sign legal documents. °? Take care of children on your own. °· Rest. °Eating and drinking °· Follow the diet recommended by your health care provider. °· If you vomit: °? Drink water, juice, or soup when you can drink without vomiting. °? Make sure you have little or no nausea before eating solid foods. °General instructions °· Have a responsible adult stay with you until you are awake and alert. °· Take over-the-counter and prescription medicines only as told by your health care provider. °· If you smoke, do not smoke without supervision. °· Keep all follow-up visits as told by your health care provider. This is important. °Contact a health care provider if: °· You keep feeling nauseous or you keep vomiting. °· You feel light-headed. °· You develop a rash. °· You have a fever. °Get help right away if: °· You have trouble breathing. °This information is not intended to replace advice given to you by your health care provider. Make sure you discuss any questions you have  with your health care provider. °Document Released: 03/05/2013 Document Revised: 10/18/2015 Document Reviewed: 09/04/2015 °Elsevier Interactive Patient Education © 2018 [employer] ° ° °Liver Biopsy, Care After °These instructions give you information on caring for yourself after your procedure. Your doctor may also give you more specific instructions. Call your doctor if you have any problems or questions after your procedure. °Follow these instructions at home: °· Rest at home for 1-2 days or as told by your doctor. °· Have someone stay with you for at least 24 hours. °· Do not do these things in the first 24 hours: °? Drive. °? Use machinery. °? Take care of other people. °? Sign legal documents. °? Take a bath or shower. °· There are many different ways to close and cover a cut (incision). For example, a cut can be closed with stitches, skin glue, or adhesive strips. Follow your doctor's instructions on: °? Taking care of your cut. °? Changing and removing your bandage (dressing). °? Removing whatever was used to close your cut. °· Do not drink alcohol in the first week. °· Do not lift more than 5 pounds or play contact sports for the first 2 weeks. °· Take medicines only as told by your doctor. For 1 week, do not take medicine that has aspirin in it or medicines like ibuprofen. °· Get your test results. °Contact a doctor if: °· A cut bleeds and leaves more than just a small spot of blood. °· A cut is red, puffs up (swells), or hurts more than before. °· Fluid or something else   comes from a cut. °· A cut smells bad. °· You have a fever or chills. °Get help right away if: °· You have swelling, bloating, or pain in your belly (abdomen). °· You get dizzy or faint. °· You have a rash. °· You feel sick to your stomach (nauseous) or throw up (vomit). °· You have trouble breathing, feel short of breath, or feel faint. °· Your chest hurts. °· You have problems talking or seeing. °· You have trouble balancing or moving  your arms or legs. °This information is not intended to replace advice given to you by your health care provider. Make sure you discuss any questions you have with your health care provider. °Document Released: 02/22/2008 Document Revised: 10/21/2015 Document Reviewed: 07/11/2013 °Elsevier Interactive Patient Education © 2018 [employer] ° ° °

## 2016-11-20 ENCOUNTER — Other Ambulatory Visit (HOSPITAL_BASED_OUTPATIENT_CLINIC_OR_DEPARTMENT_OTHER): Payer: Medicare Other

## 2016-11-20 ENCOUNTER — Ambulatory Visit (HOSPITAL_BASED_OUTPATIENT_CLINIC_OR_DEPARTMENT_OTHER): Payer: Medicare Other | Admitting: Internal Medicine

## 2016-11-20 ENCOUNTER — Encounter: Payer: Self-pay | Admitting: Internal Medicine

## 2016-11-20 ENCOUNTER — Telehealth: Payer: Self-pay | Admitting: Internal Medicine

## 2016-11-20 VITALS — BP 133/61 | HR 78 | Temp 98.0°F | Resp 18 | Ht 68.0 in | Wt 216.0 lb

## 2016-11-20 DIAGNOSIS — C3412 Malignant neoplasm of upper lobe, left bronchus or lung: Secondary | ICD-10-CM

## 2016-11-20 DIAGNOSIS — C771 Secondary and unspecified malignant neoplasm of intrathoracic lymph nodes: Secondary | ICD-10-CM

## 2016-11-20 DIAGNOSIS — R918 Other nonspecific abnormal finding of lung field: Secondary | ICD-10-CM

## 2016-11-20 DIAGNOSIS — K769 Liver disease, unspecified: Secondary | ICD-10-CM

## 2016-11-20 DIAGNOSIS — C3492 Malignant neoplasm of unspecified part of left bronchus or lung: Secondary | ICD-10-CM

## 2016-11-20 DIAGNOSIS — C787 Secondary malignant neoplasm of liver and intrahepatic bile duct: Secondary | ICD-10-CM

## 2016-11-20 DIAGNOSIS — Z5111 Encounter for antineoplastic chemotherapy: Secondary | ICD-10-CM

## 2016-11-20 LAB — CBC WITH DIFFERENTIAL/PLATELET
BASO%: 0.2 % (ref 0.0–2.0)
Basophils Absolute: 0 10*3/uL (ref 0.0–0.1)
EOS%: 2.2 % (ref 0.0–7.0)
Eosinophils Absolute: 0.2 10*3/uL (ref 0.0–0.5)
HCT: 37.6 % (ref 34.8–46.6)
HEMOGLOBIN: 12.7 g/dL (ref 11.6–15.9)
LYMPH#: 2.3 10*3/uL (ref 0.9–3.3)
LYMPH%: 28.5 % (ref 14.0–49.7)
MCH: 29.6 pg (ref 25.1–34.0)
MCHC: 33.7 g/dL (ref 31.5–36.0)
MCV: 87.8 fL (ref 79.5–101.0)
MONO#: 0.7 10*3/uL (ref 0.1–0.9)
MONO%: 9.1 % (ref 0.0–14.0)
NEUT%: 60 % (ref 38.4–76.8)
NEUTROS ABS: 4.8 10*3/uL (ref 1.5–6.5)
PLATELETS: 202 10*3/uL (ref 145–400)
RBC: 4.28 10*6/uL (ref 3.70–5.45)
RDW: 14.1 % (ref 11.2–14.5)
WBC: 7.9 10*3/uL (ref 3.9–10.3)

## 2016-11-20 LAB — COMPREHENSIVE METABOLIC PANEL
ALBUMIN: 3.9 g/dL (ref 3.5–5.0)
ALT: 22 U/L (ref 0–55)
AST: 27 U/L (ref 5–34)
Alkaline Phosphatase: 70 U/L (ref 40–150)
Anion Gap: 11 mEq/L (ref 3–11)
BILIRUBIN TOTAL: 0.43 mg/dL (ref 0.20–1.20)
BUN: 19.3 mg/dL (ref 7.0–26.0)
CALCIUM: 9.5 mg/dL (ref 8.4–10.4)
CO2: 24 mEq/L (ref 22–29)
CREATININE: 0.9 mg/dL (ref 0.6–1.1)
Chloride: 106 mEq/L (ref 98–109)
EGFR: 65 mL/min/{1.73_m2} — ABNORMAL LOW (ref >90)
Glucose: 82 mg/dl (ref 70–140)
Potassium: 4.1 mEq/L (ref 3.5–5.1)
Sodium: 141 mEq/L (ref 136–145)
TOTAL PROTEIN: 7.5 g/dL (ref 6.4–8.3)

## 2016-11-20 NOTE — Telephone Encounter (Signed)
Appointments scheduled per 11/20/16 los. °Patient was given a copy of the AVS report and appointment schedule, per 11/20/16 los. °

## 2016-11-20 NOTE — Progress Notes (Signed)
[place] [phone]   Fax:([phone]  OFFICE PROGRESS NOTE  System, Pcp Not In No address on file  DIAGNOSIS: Stage IV (T1b, N3, M1b) non-small cell lung cancer, adenocarcinoma with positive EGFR mutation in exon 21 (L858R) and recently found to have T790M resistant mutation, presented with left upper lobe lung mass in addition to left hilar and bilateral mediastinal lymphadenopathy as well as liver metastasis diagnosed in May of 2015.  PRIOR THERAPY: 1) Tarceva 150 mg by mouth daily. Started 11/25/2013. Status post 5 months of treatment discontinued today secondary to intolerance with persistent paronychia. 2) Tarceva 100 mg by mouth daily. Started 06/03/2014. Status post 8 months of treatment.   CURRENT THERAPY: Tagrisso 80 mg by mouth daily status post 20 months of treatment. First dose was given 03/04/2015.  INTERVAL HISTORY: Debra Hodges 66 y.o. female returns to the clinic today for follow-up visit. The patient is feeling fine today with no specific complaints. Her husband did not come with her this visit because he is currently on a rehabilitation facility after he broke his hip and knee. She denied having any chest pain, shortness of breath, cough or hemoptysis. She denied having any fever or chills. She has no nausea, vomiting, diarrhea or constipation. She continues to tolerate her treatment with Tagrisso fairly well. She had ultrasound guided biopsy of one of the suspicious liver lesion and the final pathology was consistent with adenocarcinoma of the lung. The patient is here today for evaluation and repeat blood work.   MEDICAL HISTORY: Past Medical History:  Diagnosis Date  . High blood pressure   . Liver lesion 06/26/2016  . Non-small cell carcinoma of lung, stage 4 ([place]) dx'd 10/2013    ALLERGIES:  is allergic to penicillins.  MEDICATIONS:  Current Outpatient Prescriptions  Medication Sig Dispense Refill  . acetaminophen  (TYLENOL) 325 MG tablet Take 650 mg by mouth every 6 (six) hours as needed. Reported on 06/15/2015    . calcium carbonate (OS-CAL) 600 MG TABS tablet Take 600 mg by mouth daily.     Marland Kitchen CLIMARA 0.1 MG/24HR patch 1 patch once a week.    . levothyroxine (SYNTHROID, LEVOTHROID) 25 MCG tablet Take 25 mcg by mouth daily before breakfast.    . loperamide (IMODIUM) 2 MG capsule Take by mouth as needed for diarrhea or loose stools. Reported on 11/22/2015    . metroNIDAZOLE (METROCREAM) 0.75 % cream Apply 1 application topically daily. Reported on 11/22/2015    . Multiple Vitamins-Calcium (ONE-A-DAY WOMENS PO) Take 1 tablet by mouth daily.    Marland Kitchen osimertinib mesylate (TAGRISSO) 80 MG tablet Take 1 tablet (80 mg total) by mouth daily. rx called to [place] and me. 05/18/16 30 tablet 2  . potassium chloride SA (K-DUR,KLOR-CON) 20 MEQ tablet 1 tablet 2 (two) times daily.    . Sodium Fluoride (CLINPRO 5000) 1.1 % PSTE Place 1 application onto teeth 3 (three) times daily.    Marland Kitchen spironolactone (ALDACTONE) 25 MG tablet 1 tablet daily.    . vitamin C (ASCORBIC ACID) 500 MG tablet Take 500 mg by mouth daily.     No current facility-administered medications for this visit.     SURGICAL HISTORY:  Past Surgical History:  Procedure Laterality Date  . ABDOMINAL HYSTERECTOMY    . BREAST BIOPSY     Right breast  . VIDEO BRONCHOSCOPY Bilateral 10/27/2013   Procedure: VIDEO BRONCHOSCOPY WITH FLUORO;  Surgeon: Collene Gobble, MD;  Location: [facility];  Service: Cardiopulmonary;  Laterality: Bilateral;    REVIEW OF SYSTEMS:  A comprehensive review of systems was negative.   PHYSICAL EXAMINATION: General appearance: alert, cooperative and no distress Head: Normocephalic, without obvious abnormality, atraumatic Neck: no adenopathy, no JVD, supple, symmetrical, trachea midline and thyroid not enlarged, symmetric, no tenderness/mass/nodules Lymph nodes: Cervical, supraclavicular, and axillary nodes normal. Resp: clear to  auscultation bilaterally Back: symmetric, no curvature. ROM normal. No CVA tenderness. Cardio: regular rate and rhythm, S1, S2 normal, no murmur, click, rub or gallop GI: soft, non-tender; bowel sounds normal; no masses,  no organomegaly Extremities: extremities normal, atraumatic, no cyanosis or edema  ECOG PERFORMANCE STATUS: 0 - Asymptomatic  Blood pressure 133/61, pulse 78, temperature 98 F (36.7 C), temperature source Oral, resp. rate 18, height '5\' 8"'$  (1.727 m), weight 216 lb (98 kg), SpO2 100 %.  LABORATORY DATA: Lab Results  Component Value Date   WBC 7.9 11/20/2016   HGB 12.7 11/20/2016   HCT 37.6 11/20/2016   MCV 87.8 11/20/2016   PLT 202 11/20/2016      Chemistry      Component Value Date/Time   NA 141 10/16/2016 1608   K 4.2 10/16/2016 1608   CO2 26 10/16/2016 1608   BUN 15.0 10/16/2016 1608   CREATININE 0.9 10/16/2016 1608      Component Value Date/Time   CALCIUM 9.8 10/16/2016 1608   ALKPHOS 82 10/16/2016 1608   AST 26 10/16/2016 1608   ALT 20 10/16/2016 1608   BILITOT 0.49 10/16/2016 1608       RADIOGRAPHIC STUDIES: US Biopsy  Result Date: 2016-11-02 INDICATION: 66 year old female with a history of non-small-cell carcinoma and new liver mass concerning for metastases EXAM: ULTRASOUND-GUIDED BIOPSY OF LIVER MASS MEDICATIONS: None. ANESTHESIA/SEDATION: Moderate (conscious) sedation was employed during this procedure. A total of Versed 2.0 mg and Fentanyl 100 mcg was administered intravenously. Moderate Sedation Time: 19 minutes. The patient's level of consciousness and vital signs were monitored continuously by radiology nursing throughout the procedure under my direct supervision. FLUOROSCOPY TIME:  None COMPLICATIONS: None PROCEDURE: Informed written consent was obtained from the patient after a thorough discussion of the procedural risks, benefits and alternatives. All questions were addressed. Maximal Sterile Barrier Technique was utilized including caps,  mask, sterile gowns, sterile gloves, sterile drape, hand hygiene and skin antiseptic. A timeout was performed prior to the initiation of the procedure. Patient positioned supine position on the ultrasound table. Ultrasound survey images were performed of the abdomen with images stored sent to PACs. The patient was then prepped and draped in the usual sterile fashion. The skin and subcutaneous tissues were generously infiltrated 1% lidocaine for local anesthesia. Using ultrasound guidance, a 15 cm 17 gauge guide advanced into the ill-defined hypo echoic region of the liver. Once we confirmed the needle tip, multiple 18 gauge core biopsy were performed. Three Gel-Foam pledgets were placed. Needle was removed and a final image was stored. Patient tolerated the procedure well and remained hemodynamically stable throughout. No complications were encountered and no significant blood loss. IMPRESSION: Status post ultrasound-guided biopsy of liver mass with tissue specimen sent to pathology for complete histopathologic analysis. Signed, Dulcy Fanny. Earleen Newport, DO [facility] Electronically Signed   By: Corrie Mckusick D.O.   On: 02-Nov-2016 14:20    ASSESSMENT AND PLAN:  This is a very pleasant 66 years old white female with a stage IV non-small cell lung cancer, adenocarcinoma with positive EGFR mutation in exon 21 (L858R) diagnosed  in May 2015 status post treatment with Tarceva for 13 months discontinued secondary to disease progression and development of T790M resistant mutation. The patient is currently undergoing treatment with Tagrisso 80 mg by mouth daily status post 20 months and has been tolerating this treatment better. She had slowly progressive lesion in the liver and repeat biopsy was consistent with adenocarcinoma of the lung. I recommended for the patient to continue her current treatment with Tagrisso for now. I will continue to monitor the lesion in  the liver closely on upcoming scan and if it continues to increase in size, we will send the tissue block for molecular study to rule out development of any other resistant mutations. I will see her back for follow-up visit in one month for reevaluation with repeat blood work. She was advised to call immediately if she has any concerning symptoms in the interval. The patient voices understanding of current disease status and treatment options and is in agreement with the current care plan. All questions were answered. The patient knows to call the clinic with any problems, questions or concerns. We can certainly see the patient much sooner if necessary. I spent 10 minutes counseling the patient face to face. The total time spent in the appointment was 15 minutes.  Disclaimer: This note was dictated with voice recognition software. Similar sounding words can inadvertently be transcribed and may not be corrected upon review.

## 2016-12-06 ENCOUNTER — Other Ambulatory Visit: Payer: Self-pay | Admitting: Medical Oncology

## 2016-12-06 DIAGNOSIS — C3412 Malignant neoplasm of upper lobe, left bronchus or lung: Secondary | ICD-10-CM

## 2016-12-06 MED ORDER — OSIMERTINIB MESYLATE 80 MG PO TABS: 80.0000 mg | ORAL_TABLET | Freq: Every day | ORAL | 2 refills | Status: DC

## 2016-12-20 ENCOUNTER — Encounter: Payer: Self-pay | Admitting: Internal Medicine

## 2016-12-20 ENCOUNTER — Other Ambulatory Visit (HOSPITAL_BASED_OUTPATIENT_CLINIC_OR_DEPARTMENT_OTHER): Payer: Medicare Other

## 2016-12-20 ENCOUNTER — Encounter: Payer: Self-pay | Admitting: *Deleted

## 2016-12-20 ENCOUNTER — Telehealth: Payer: Self-pay | Admitting: Internal Medicine

## 2016-12-20 ENCOUNTER — Ambulatory Visit (HOSPITAL_BASED_OUTPATIENT_CLINIC_OR_DEPARTMENT_OTHER): Payer: Medicare Other | Admitting: Internal Medicine

## 2016-12-20 VITALS — BP 136/63 | HR 84 | Temp 98.4°F | Resp 18 | Ht 68.0 in | Wt 216.0 lb

## 2016-12-20 DIAGNOSIS — R1011 Right upper quadrant pain: Secondary | ICD-10-CM

## 2016-12-20 DIAGNOSIS — K769 Liver disease, unspecified: Secondary | ICD-10-CM

## 2016-12-20 DIAGNOSIS — Z5111 Encounter for antineoplastic chemotherapy: Secondary | ICD-10-CM

## 2016-12-20 DIAGNOSIS — C3412 Malignant neoplasm of upper lobe, left bronchus or lung: Secondary | ICD-10-CM

## 2016-12-20 DIAGNOSIS — C771 Secondary and unspecified malignant neoplasm of intrathoracic lymph nodes: Secondary | ICD-10-CM | POA: Diagnosis not present

## 2016-12-20 DIAGNOSIS — C787 Secondary malignant neoplasm of liver and intrahepatic bile duct: Secondary | ICD-10-CM

## 2016-12-20 LAB — CBC WITH DIFFERENTIAL/PLATELET
BASO%: 0.3 % (ref 0.0–2.0)
Basophils Absolute: 0 10*3/uL (ref 0.0–0.1)
EOS%: 2.7 % (ref 0.0–7.0)
Eosinophils Absolute: 0.2 10*3/uL (ref 0.0–0.5)
HCT: 37.2 % (ref 34.8–46.6)
HEMOGLOBIN: 12.6 g/dL (ref 11.6–15.9)
LYMPH%: 25.4 % (ref 14.0–49.7)
MCH: 29.5 pg (ref 25.1–34.0)
MCHC: 33.8 g/dL (ref 31.5–36.0)
MCV: 87.2 fL (ref 79.5–101.0)
MONO#: 0.7 10*3/uL (ref 0.1–0.9)
MONO%: 8.2 % (ref 0.0–14.0)
NEUT%: 63.4 % (ref 38.4–76.8)
NEUTROS ABS: 5.4 10*3/uL (ref 1.5–6.5)
Platelets: 198 10*3/uL (ref 145–400)
RBC: 4.27 10*6/uL (ref 3.70–5.45)
RDW: 13.8 % (ref 11.2–14.5)
WBC: 8.5 10*3/uL (ref 3.9–10.3)
lymph#: 2.2 10*3/uL (ref 0.9–3.3)

## 2016-12-20 LAB — COMPREHENSIVE METABOLIC PANEL
ALT: 15 U/L (ref 0–55)
AST: 19 U/L (ref 5–34)
Albumin: 3.8 g/dL (ref 3.5–5.0)
Alkaline Phosphatase: 79 U/L (ref 40–150)
Anion Gap: 11 mEq/L (ref 3–11)
BILIRUBIN TOTAL: 0.43 mg/dL (ref 0.20–1.20)
BUN: 18.6 mg/dL (ref 7.0–26.0)
CO2: 23 mEq/L (ref 22–29)
Calcium: 9.2 mg/dL (ref 8.4–10.4)
Chloride: 107 mEq/L (ref 98–109)
Creatinine: 0.9 mg/dL (ref 0.6–1.1)
EGFR: 67 mL/min/{1.73_m2} — AB (ref >90)
GLUCOSE: 91 mg/dL (ref 70–140)
Potassium: 3.7 mEq/L (ref 3.5–5.1)
SODIUM: 140 meq/L (ref 136–145)
TOTAL PROTEIN: 7.4 g/dL (ref 6.4–8.3)

## 2016-12-20 NOTE — Progress Notes (Signed)
Oncology Nurse Navigator Documentation  Oncology Nurse Navigator Flowsheets 12/20/2016  Navigator Location [facility]  Navigator Encounter Type Clinic/MDC/I followed up with Ms. Debra Hodges today.  She is doing well without complaints.  She is receiving oral biologic for metastatic lung cancer.  She receives medication at zero cost.  She states she is getting her medication without difficulty.  She is to have CT scan before next appt with Dr. Julien Nordmann.  She verbalized understanding.   Patient Visit Type MedOnc  Treatment Phase Treatment  Barriers/Navigation Needs Education  Education Other  Interventions Education  Education Method Verbal  Acuity Level 1  Time Spent with Patient 15

## 2016-12-20 NOTE — Progress Notes (Signed)
[place] [phone]   Fax:([phone]) [phone]  OFFICE PROGRESS NOTE  System, Pcp Not In No address on file  DIAGNOSIS: Stage IV (T1b, N3, M1b) non-small cell lung cancer, adenocarcinoma with positive EGFR mutation in exon 21 (L858R) and recently found to have T790M resistant mutation, presented with left upper lobe lung mass in addition to left hilar and bilateral mediastinal lymphadenopathy as well as liver metastasis diagnosed in May of 2015.  PRIOR THERAPY: 1) Tarceva 150 mg by mouth daily. Started 11/25/2013. Status post 5 months of treatment discontinued today secondary to intolerance with persistent paronychia. 2) Tarceva 100 mg by mouth daily. Started 06/03/2014. Status post 8 months of treatment.   CURRENT THERAPY: Tagrisso 80 mg by mouth daily status post 21 months of treatment. First dose was given 03/04/2015.  INTERVAL HISTORY: Debra Hodges 66 y.o. female returns to the clinic today for follow-up visit. The patient is feeling fine today with no specific complaints. She denied having any chest pain, shortness of breath, cough or hemoptysis. She continues to have intermittent right upper quadrant abdominal pain. She denied having any nausea, vomiting diarrhea or constipation. She has no skin rash. She denied having any fever or chills. She has no significant weight loss or night sweats. The patient is here today for evaluation and repeat blood work.    MEDICAL HISTORY: Past Medical History:  Diagnosis Date  . High blood pressure   . Liver lesion 06/26/2016  . Non-small cell carcinoma of lung, stage 4 ([place]) dx'd 10/2013    ALLERGIES:  is allergic to penicillins.  MEDICATIONS:  Current Outpatient Prescriptions  Medication Sig Dispense Refill  . acetaminophen (TYLENOL) 325 MG tablet Take 650 mg by mouth every 6 (six) hours as needed. Reported on 06/15/2015    . calcium carbonate (OS-CAL) 600 MG TABS tablet Take 600 mg by mouth daily.     Marland Kitchen  levothyroxine (SYNTHROID, LEVOTHROID) 50 MCG tablet Take 50 mcg by mouth daily.    . metroNIDAZOLE (METROCREAM) 0.75 % cream Apply 1 application topically daily. Reported on 11/22/2015    . Multiple Vitamins-Calcium (ONE-A-DAY WOMENS PO) Take 1 tablet by mouth daily.    Marland Kitchen osimertinib mesylate (TAGRISSO) 80 MG tablet Take 1 tablet (80 mg total) by mouth daily. 30 tablet 2  . potassium chloride SA (K-DUR,KLOR-CON) 20 MEQ tablet 1 tablet 2 (two) times daily.    . Sodium Fluoride (CLINPRO 5000) 1.1 % PSTE Place 1 application onto teeth 3 (three) times daily.    Marland Kitchen spironolactone (ALDACTONE) 25 MG tablet 1 tablet daily.    . vitamin C (ASCORBIC ACID) 500 MG tablet Take 500 mg by mouth daily.    Marland Kitchen CLIMARA 0.1 MG/24HR patch 1 patch once a week.    . loperamide (IMODIUM) 2 MG capsule Take by mouth as needed for diarrhea or loose stools. Reported on 11/22/2015     No current facility-administered medications for this visit.     SURGICAL HISTORY:  Past Surgical History:  Procedure Laterality Date  . ABDOMINAL HYSTERECTOMY    . BREAST BIOPSY     Right breast  . VIDEO BRONCHOSCOPY Bilateral 10/27/2013   Procedure: VIDEO BRONCHOSCOPY WITH FLUORO;  Surgeon: Collene Gobble, MD;  Location: [facility];  Service: Cardiopulmonary;  Laterality: Bilateral;    REVIEW OF SYSTEMS:  A comprehensive review of systems was negative except for: Gastrointestinal: positive for abdominal pain   PHYSICAL EXAMINATION: General appearance: alert, cooperative and no distress Head: Normocephalic, without  obvious abnormality, atraumatic Neck: no adenopathy, no JVD, supple, symmetrical, trachea midline and thyroid not enlarged, symmetric, no tenderness/mass/nodules Lymph nodes: Cervical, supraclavicular, and axillary nodes normal. Resp: clear to auscultation bilaterally Back: symmetric, no curvature. ROM normal. No CVA tenderness. Cardio: regular rate and rhythm, S1, S2 normal, no murmur, click, rub or gallop GI: soft,  non-tender; bowel sounds normal; no masses,  no organomegaly Extremities: extremities normal, atraumatic, no cyanosis or edema  ECOG PERFORMANCE STATUS: 0 - Asymptomatic  Blood pressure 136/63, pulse 84, temperature 98.4 F (36.9 C), temperature source Oral, resp. rate 18, height '5\' 8"'  (1.727 m), weight 216 lb (98 kg), SpO2 100 %.  LABORATORY DATA: Lab Results  Component Value Date   WBC 8.5 12/20/2016   HGB 12.6 12/20/2016   HCT 37.2 12/20/2016   MCV 87.2 12/20/2016   PLT 198 12/20/2016      Chemistry      Component Value Date/Time   NA 141 11/20/2016 0837   K 4.1 11/20/2016 0837   CO2 24 11/20/2016 0837   BUN 19.3 11/20/2016 0837   CREATININE 0.9 11/20/2016 0837      Component Value Date/Time   CALCIUM 9.5 11/20/2016 0837   ALKPHOS 70 11/20/2016 0837   AST 27 11/20/2016 0837   ALT 22 11/20/2016 0837   BILITOT 0.43 11/20/2016 0837       RADIOGRAPHIC STUDIES: No results found.  ASSESSMENT AND PLAN:  This is a very pleasant 66 years old white female with a stage IV non-small cell lung cancer, adenocarcinoma with positive EGFR mutation in exon 21 (L858R) diagnosed in May 2015 status post treatment with Tarceva for 13 months discontinued secondary to disease progression and development of T790M resistant mutation. The patient is currently undergoing treatment with Tagrisso 80 mg by mouth daily status post 21 months and has been tolerating this treatment better. CBC is unremarkable today. Comprehensive metabolic panel is still pending. I recommended for the patient to continue her treatment with Tagrisso with the same dose. I will see her back for follow-up visit in one month's for evaluation after repeating CT scan of the chest, abdomen and pelvis for restaging of her disease. She was advised to call immediately if she has any concerning symptoms in the interval. The patient voices understanding of current disease status and treatment options and is in agreement with the  current care plan. All questions were answered. The patient knows to call the clinic with any problems, questions or concerns. We can certainly see the patient much sooner if necessary. I spent 10 minutes counseling the patient face to face. The total time spent in the appointment was 15 minutes.  Disclaimer: This note was dictated with voice recognition software. Similar sounding words can inadvertently be transcribed and may not be corrected upon review.

## 2016-12-20 NOTE — Telephone Encounter (Signed)
Scheduled appt per 7/25 los - Gave patient AVS and calender per los. [facility] to contact patient with ct

## 2017-01-15 ENCOUNTER — Other Ambulatory Visit: Payer: Medicare Other

## 2017-01-19 ENCOUNTER — Ambulatory Visit (HOSPITAL_COMMUNITY)
Admission: RE | Admit: 2017-01-19 | Discharge: 2017-01-19 | Disposition: A | Discharge status: Home / Self Care | Payer: Medicare Other | Source: Ambulatory Visit | Attending: Internal Medicine | Admitting: Internal Medicine

## 2017-01-19 ENCOUNTER — Other Ambulatory Visit (HOSPITAL_BASED_OUTPATIENT_CLINIC_OR_DEPARTMENT_OTHER): Payer: Medicare Other

## 2017-01-19 DIAGNOSIS — C771 Secondary and unspecified malignant neoplasm of intrathoracic lymph nodes: Secondary | ICD-10-CM | POA: Diagnosis not present

## 2017-01-19 DIAGNOSIS — Z5111 Encounter for antineoplastic chemotherapy: Secondary | ICD-10-CM | POA: Insufficient documentation

## 2017-01-19 DIAGNOSIS — C787 Secondary malignant neoplasm of liver and intrahepatic bile duct: Secondary | ICD-10-CM | POA: Diagnosis not present

## 2017-01-19 DIAGNOSIS — C3490 Malignant neoplasm of unspecified part of unspecified bronchus or lung: Secondary | ICD-10-CM | POA: Diagnosis not present

## 2017-01-19 DIAGNOSIS — K769 Liver disease, unspecified: Secondary | ICD-10-CM

## 2017-01-19 DIAGNOSIS — C3412 Malignant neoplasm of upper lobe, left bronchus or lung: Secondary | ICD-10-CM | POA: Diagnosis not present

## 2017-01-19 DIAGNOSIS — N200 Calculus of kidney: Secondary | ICD-10-CM | POA: Insufficient documentation

## 2017-01-19 DIAGNOSIS — I700 Atherosclerosis of aorta: Secondary | ICD-10-CM | POA: Insufficient documentation

## 2017-01-19 LAB — COMPREHENSIVE METABOLIC PANEL
ALBUMIN: 3.8 g/dL (ref 3.5–5.0)
ALT: 17 U/L (ref 0–55)
ANION GAP: 9 meq/L (ref 3–11)
AST: 24 U/L (ref 5–34)
Alkaline Phosphatase: 86 U/L (ref 40–150)
BILIRUBIN TOTAL: 0.45 mg/dL (ref 0.20–1.20)
BUN: 19 mg/dL (ref 7.0–26.0)
CO2: 26 meq/L (ref 22–29)
CREATININE: 0.9 mg/dL (ref 0.6–1.1)
Calcium: 9.8 mg/dL (ref 8.4–10.4)
Chloride: 106 mEq/L (ref 98–109)
EGFR: 68 mL/min/{1.73_m2} — AB (ref >90)
Glucose: 86 mg/dl (ref 70–140)
Potassium: 4.2 mEq/L (ref 3.5–5.1)
Sodium: 140 mEq/L (ref 136–145)
TOTAL PROTEIN: 7.8 g/dL (ref 6.4–8.3)

## 2017-01-19 LAB — CBC WITH DIFFERENTIAL/PLATELET
BASO%: 0.1 % (ref 0.0–2.0)
Basophils Absolute: 0 10*3/uL (ref 0.0–0.1)
EOS ABS: 0.2 10*3/uL (ref 0.0–0.5)
EOS%: 1.9 % (ref 0.0–7.0)
HEMATOCRIT: 36.4 % (ref 34.8–46.6)
HEMOGLOBIN: 12 g/dL (ref 11.6–15.9)
LYMPH#: 2.4 10*3/uL (ref 0.9–3.3)
LYMPH%: 26.6 % (ref 14.0–49.7)
MCH: 29.3 pg (ref 25.1–34.0)
MCHC: 33 g/dL (ref 31.5–36.0)
MCV: 88.8 fL (ref 79.5–101.0)
MONO#: 0.8 10*3/uL (ref 0.1–0.9)
MONO%: 8.7 % (ref 0.0–14.0)
NEUT%: 62.7 % (ref 38.4–76.8)
NEUTROS ABS: 5.7 10*3/uL (ref 1.5–6.5)
PLATELETS: 176 10*3/uL (ref 145–400)
RBC: 4.1 10*6/uL (ref 3.70–5.45)
RDW: 14 % (ref 11.2–14.5)
WBC: 9.1 10*3/uL (ref 3.9–10.3)

## 2017-01-19 MED ORDER — IOPAMIDOL (ISOVUE-300) INJECTION 61%
INTRAVENOUS | Status: AC
  Filled 2017-01-19: qty 100

## 2017-01-19 MED ORDER — IOPAMIDOL (ISOVUE-300) INJECTION 61%
100.0000 mL | Freq: Once | INTRAVENOUS | Status: AC | PRN
  Administered 2017-01-19: 100 mL via INTRAVENOUS

## 2017-01-22 ENCOUNTER — Telehealth: Payer: Self-pay | Admitting: Internal Medicine

## 2017-01-22 ENCOUNTER — Encounter: Payer: Self-pay | Admitting: Internal Medicine

## 2017-01-22 ENCOUNTER — Ambulatory Visit (HOSPITAL_BASED_OUTPATIENT_CLINIC_OR_DEPARTMENT_OTHER): Payer: Medicare Other | Admitting: Internal Medicine

## 2017-01-22 VITALS — BP 140/62 | HR 83 | Temp 98.4°F | Resp 18 | Ht 68.0 in | Wt 217.1 lb

## 2017-01-22 DIAGNOSIS — Z5111 Encounter for antineoplastic chemotherapy: Secondary | ICD-10-CM

## 2017-01-22 DIAGNOSIS — K769 Liver disease, unspecified: Secondary | ICD-10-CM

## 2017-01-22 DIAGNOSIS — C771 Secondary and unspecified malignant neoplasm of intrathoracic lymph nodes: Secondary | ICD-10-CM | POA: Diagnosis not present

## 2017-01-22 DIAGNOSIS — C787 Secondary malignant neoplasm of liver and intrahepatic bile duct: Secondary | ICD-10-CM

## 2017-01-22 DIAGNOSIS — C3412 Malignant neoplasm of upper lobe, left bronchus or lung: Secondary | ICD-10-CM

## 2017-01-22 NOTE — Progress Notes (Signed)
[place] [phone]   [phone]  OFFICE PROGRESS NOTE  System, Pcp Not In No address on file  DIAGNOSIS: Stage IV (T1b, N3, M1b) non-small cell lung cancer, adenocarcinoma with positive EGFR mutation in exon 21 (L858R) and recently found to have T790M resistant mutation, presented with left upper lobe lung mass in addition to left hilar and bilateral mediastinal lymphadenopathy as well as liver metastasis diagnosed in May of 2015.  PRIOR THERAPY: 1) Tarceva 150 mg by mouth daily. Started 11/25/2013. Status post 5 months of treatment discontinued today secondary to intolerance with persistent paronychia. 2) Tarceva 100 mg by mouth daily. Started 06/03/2014. Status post 8 months of treatment.   CURRENT THERAPY: Tagrisso 80 mg by mouth daily status post 22 months of treatment. First dose was given 03/04/2015.  INTERVAL HISTORY: Debra Hodges 66 y.o. female returns to the clinic today for follow-up visit accompanied by her husband. The patient is feeling fine today with no specific complaints. She denied having any chest pain, shortness of breath, cough or hemoptysis. She denied having any nausea, vomiting, diarrhea or constipation. She has no fever or chills. The patient has no significant weight loss or night sweats. She has been tolerating her treatment with Tagrisso fairly well. She had repeat CT scan of the chest, abdomen and pelvis performed recently and she is here for evaluation and discussion of her scan results.  MEDICAL HISTORY: Past Medical History:  Diagnosis Date  . High blood pressure   . Liver lesion 06/26/2016  . Non-small cell carcinoma of lung, stage 4 ([place]) dx'd 10/2013    ALLERGIES:  is allergic to penicillins.  MEDICATIONS:  Current Outpatient Prescriptions  Medication Sig Dispense Refill  . acetaminophen (TYLENOL) 325 MG tablet Take 650 mg by mouth every 6 (six) hours as needed. Reported on 06/15/2015    . calcium  carbonate (OS-CAL) 600 MG TABS tablet Take 600 mg by mouth daily.     Marland Kitchen CLIMARA 0.1 MG/24HR patch 1 patch once a week.    . levothyroxine (SYNTHROID, LEVOTHROID) 50 MCG tablet Take 50 mcg by mouth daily.    Marland Kitchen loperamide (IMODIUM) 2 MG capsule Take by mouth as needed for diarrhea or loose stools. Reported on 11/22/2015    . metroNIDAZOLE (METROCREAM) 0.75 % cream Apply 1 application topically daily. Reported on 11/22/2015    . Multiple Vitamins-Calcium (ONE-A-DAY WOMENS PO) Take 1 tablet by mouth daily.    Marland Kitchen osimertinib mesylate (TAGRISSO) 80 MG tablet Take 1 tablet (80 mg total) by mouth daily. 30 tablet 2  . potassium chloride SA (K-DUR,KLOR-CON) 20 MEQ tablet 1 tablet 2 (two) times daily.    . Sodium Fluoride (CLINPRO 5000) 1.1 % PSTE Place 1 application onto teeth 3 (three) times daily.    Marland Kitchen spironolactone (ALDACTONE) 25 MG tablet 1 tablet daily.    . vitamin C (ASCORBIC ACID) 500 MG tablet Take 500 mg by mouth daily.     No current facility-administered medications for this visit.     SURGICAL HISTORY:  Past Surgical History:  Procedure Laterality Date  . ABDOMINAL HYSTERECTOMY    . BREAST BIOPSY     Right breast  . VIDEO BRONCHOSCOPY Bilateral 10/27/2013   Procedure: VIDEO BRONCHOSCOPY WITH FLUORO;  Surgeon: Collene Gobble, MD;  Location: [facility];  Service: Cardiopulmonary;  Laterality: Bilateral;    REVIEW OF SYSTEMS:  Constitutional: negative Eyes: negative Ears, nose, mouth, throat, and face: negative Respiratory: negative Cardiovascular: negative Gastrointestinal: negative  Genitourinary:negative Integument/breast: negative Hematologic/lymphatic: negative Musculoskeletal:negative Neurological: negative Behavioral/Psych: negative Endocrine: negative Allergic/Immunologic: negative   PHYSICAL EXAMINATION: General appearance: alert, cooperative and no distress Head: Normocephalic, without obvious abnormality, atraumatic Neck: no adenopathy, no JVD, supple, symmetrical,  trachea midline and thyroid not enlarged, symmetric, no tenderness/mass/nodules Lymph nodes: Cervical, supraclavicular, and axillary nodes normal. Resp: clear to auscultation bilaterally Back: symmetric, no curvature. ROM normal. No CVA tenderness. Cardio: regular rate and rhythm, S1, S2 normal, no murmur, click, rub or gallop GI: soft, non-tender; bowel sounds normal; no masses,  no organomegaly Extremities: extremities normal, atraumatic, no cyanosis or edema Neurologic: Alert and oriented X 3, normal strength and tone. Normal symmetric reflexes. Normal coordination and gait  ECOG PERFORMANCE STATUS: 0 - Asymptomatic  Blood pressure 140/62, pulse 83, temperature 98.4 F (36.9 C), temperature source Oral, resp. rate 18, height _0  (1.727 m), weight 217 lb 1.6 oz (98.5 kg), SpO2 100 %.  LABORATORY DATA: Lab Results  Component Value Date   WBC 9.1 01/19/2017   HGB 12.0 01/19/2017   HCT 36.4 01/19/2017   MCV 88.8 01/19/2017   PLT 176 01/19/2017      Chemistry      Component Value Date/Time   NA 140 01/19/2017 1551   K 4.2 01/19/2017 1551   CO2 26 01/19/2017 1551   BUN 19.0 01/19/2017 1551   CREATININE 0.9 01/19/2017 1551      Component Value Date/Time   CALCIUM 9.8 01/19/2017 1551   ALKPHOS 86 01/19/2017 1551   AST 24 01/19/2017 1551   ALT 17 01/19/2017 1551   BILITOT 0.45 01/19/2017 1551       RADIOGRAPHIC STUDIES: Ct Chest W Contrast  Result Date: 01/20/2017 CLINICAL DATA:  Lung cancer. EXAM: CT CHEST, ABDOMEN, AND PELVIS WITH CONTRAST TECHNIQUE: Multidetector CT imaging of the chest, abdomen and pelvis was performed following the standard protocol during bolus administration of intravenous contrast. CONTRAST:  153m ISOVUE-300 IOPAMIDOL (ISOVUE-300) INJECTION 61% COMPARISON:  10/16/2016 FINDINGS: CT CHEST FINDINGS Cardiovascular: The heart size is normal. No pericardial effusion. No thoracic aortic aneurysm. Mediastinum/Nodes: No mediastinal lymphadenopathy. There is  no hilar lymphadenopathy. The esophagus has normal imaging features. Tiny calcified right thyroid nodule stable. There is no axillary lymphadenopathy. Lungs/Pleura: 4 mm posterior right upper lobe pulmonary nodule is unchanged. Irregular posterior left upper lobe lesion measured previously at 2.8 x 1.8 cm now measures 2.9 x 1.8 cm. Tiny 2-3 mm left upper lobe nodule on image 30 is stable. Musculoskeletal: Bone windows reveal no worrisome lytic or sclerotic osseous lesions. CT ABDOMEN PELVIS FINDINGS Hepatobiliary: Central liver lesion seen on the prior study appears larger today measuring 3.9 x 2.8 cm on today's exam compared to 3.1 x 2.1 cm previously. With 2.5 cm lesion posterior aspect of the lateral segment left liver also has progressed from 1.0 cm on the prior study. Gallbladder is decompressed. No intrahepatic or extrahepatic biliary dilation. Pancreas: No focal mass lesion. No dilatation of the main duct. No intraparenchymal cyst. No peripancreatic edema. Spleen: Stable 9 mm hypoattenuating lesion in the spleen. Adrenals/Urinary Tract: No adrenal nodule or mass. 1-2 mm nonobstructing stone identified upper pole right kidney. 5 mm nonobstructing stone identified lower pole left kidney. 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 No evidence for hydroureter. The urinary bladder appears normal for the degree of distention. Stomach/Bowel: Stomach is nondistended. No gastric wall thickening. No evidence of outlet obstruction. Duodenum is normally positioned as is the ligament of Treitz. No small bowel wall thickening.  No small bowel dilatation. The terminal ileum is normal. The appendix is not visualized, but there is no edema or inflammation in the region of the cecum. No gross colonic mass. No colonic wall thickening. No substantial diverticular change. Vascular/Lymphatic: There is abdominal aortic atherosclerosis without aneurysm. There is no gastrohepatic or hepatoduodenal ligament  lymphadenopathy. No intraperitoneal or retroperitoneal lymphadenopathy. No pelvic sidewall lymphadenopathy. Reproductive: Uterus is surgically absent. There is no adnexal mass. Other: No intraperitoneal free fluid. Musculoskeletal: Bone windows reveal no worrisome lytic or sclerotic osseous lesions. IMPRESSION: 1. Interval progression of the central right liver metastatic lesion with progression of a second lesion in the lateral segment left liver. 2. Stable appearance irregular posterior left upper lobe lesion with tiny bilateral pulmonary nodules unchanged in the interval. 3. Bilateral nonobstructing renal stones. 4.  Aortic Atherosclerois (ICD10-170.0) Electronically Signed   By: Misty Stanley M.D.   On: 01/20/2017 11:44   Ct Abdomen Pelvis W Contrast  Result Date: 01/20/2017 CLINICAL DATA:  Lung cancer. EXAM: CT CHEST, ABDOMEN, AND PELVIS WITH CONTRAST TECHNIQUE: Multidetector CT imaging of the chest, abdomen and pelvis was performed following the standard protocol during bolus administration of intravenous contrast. CONTRAST:  132m ISOVUE-300 IOPAMIDOL (ISOVUE-300) INJECTION 61% COMPARISON:  10/16/2016 FINDINGS: CT CHEST FINDINGS Cardiovascular: The heart size is normal. No pericardial effusion. No thoracic aortic aneurysm. Mediastinum/Nodes: No mediastinal lymphadenopathy. There is no hilar lymphadenopathy. The esophagus has normal imaging features. Tiny calcified right thyroid nodule stable. There is no axillary lymphadenopathy. Lungs/Pleura: 4 mm posterior right upper lobe pulmonary nodule is unchanged. Irregular posterior left upper lobe lesion measured previously at 2.8 x 1.8 cm now measures 2.9 x 1.8 cm. Tiny 2-3 mm left upper lobe nodule on image 30 is stable. Musculoskeletal: Bone windows reveal no worrisome lytic or sclerotic osseous lesions. CT ABDOMEN PELVIS FINDINGS Hepatobiliary: Central liver lesion seen on the prior study appears larger today measuring 3.9 x 2.8 cm on today's exam compared  to 3.1 x 2.1 cm previously. With 2.5 cm lesion posterior aspect of the lateral segment left liver also has progressed from 1.0 cm on the prior study. Gallbladder is decompressed. No intrahepatic or extrahepatic biliary dilation. Pancreas: No focal mass lesion. No dilatation of the main duct. No intraparenchymal cyst. No peripancreatic edema. Spleen: Stable 9 mm hypoattenuating lesion in the spleen. Adrenals/Urinary Tract: No adrenal nodule or mass. 1-2 mm nonobstructing stone identified upper pole right kidney. 5 mm nonobstructing stone identified lower pole left kidney. 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 No evidence for hydroureter. The urinary bladder appears normal for the degree of distention. Stomach/Bowel: Stomach is nondistended. No gastric wall thickening. No evidence of outlet obstruction. Duodenum is normally positioned as is the ligament of Treitz. No small bowel wall thickening. No small bowel dilatation. The terminal ileum is normal. The appendix is not visualized, but there is no edema or inflammation in the region of the cecum. No gross colonic mass. No colonic wall thickening. No substantial diverticular change. Vascular/Lymphatic: There is abdominal aortic atherosclerosis without aneurysm. There is no gastrohepatic or hepatoduodenal ligament lymphadenopathy. No intraperitoneal or retroperitoneal lymphadenopathy. No pelvic sidewall lymphadenopathy. Reproductive: Uterus is surgically absent. There is no adnexal mass. Other: No intraperitoneal free fluid. Musculoskeletal: Bone windows reveal no worrisome lytic or sclerotic osseous lesions. IMPRESSION: 1. Interval progression of the central right liver metastatic lesion with progression of a second lesion in the lateral segment left liver. 2. Stable appearance irregular posterior left upper lobe lesion with tiny  bilateral pulmonary nodules unchanged in the interval. 3. Bilateral nonobstructing renal stones. 4.  Aortic  Atherosclerois (ICD10-170.0) Electronically Signed   By: Misty Stanley M.D.   On: 01/20/2017 11:44    ASSESSMENT AND PLAN:  This is a very pleasant 66 years old white female with a stage IV non-small cell lung cancer, adenocarcinoma with positive EGFR mutation in exon 21 (L858R) diagnosed in May 2015 status post treatment with Tarceva for 13 months discontinued secondary to disease progression and development of T790M resistant mutation. The patient is currently undergoing treatment with Tagrisso 80 mg by mouth daily status post 22 months and has been tolerating this treatment better. The patient had repeat CT scan of the chest, abdomen and pelvis performed recently. I personally and independently reviewed the scan images and discuss the results with the patient today. Unfortunately she continues to have increase in the size of the right liver metastatic lesion with progression of his second lesion in the lateral segment of the left liver. No other significant progressive disease. I discussed with the patient several options for management of her condition including continuation of her treatment with Tagrisso but I will refer the patient to Dr. Tammi Klippel from radiation oncology for consideration of stereotactic body radiotherapy to the progressive liver lesion. The patient also understands that her disease may continue to progress and the next option would be consideration of systemic chemotherapy or other targeted therapy if she develop any other resistant mutation. I will see her back for follow-up visit in one month's for reevaluation and repeat blood work. She was advised to call immediately if she has any concerning symptoms in the interval. The patient voices understanding of current disease status and treatment options and is in agreement with the current care plan. All questions were answered. The patient knows to call the clinic with any problems, questions or concerns. We can certainly see the  patient much sooner if necessary.  Disclaimer: This note was dictated with voice recognition software. Similar sounding words can inadvertently be transcribed and may not be corrected upon review.

## 2017-01-22 NOTE — Telephone Encounter (Signed)
Gave patient avs and calendar with upcoming appts.  °

## 2017-01-23 ENCOUNTER — Encounter: Payer: Self-pay | Admitting: Radiation Oncology

## 2017-01-24 DIAGNOSIS — C787 Secondary malignant neoplasm of liver and intrahepatic bile duct: Secondary | ICD-10-CM | POA: Insufficient documentation

## 2017-01-24 NOTE — Progress Notes (Signed)
Histology and Location of Primary Cancer: Stage IV (T1b, N3, M1b) non-small cell lung cancer, adenocarcinoma with positive EGFR mutation in exon 21 (L858R) and recently found to have T790M resistant mutation, presented with left upper lobe lung mass in addition to left hilar and bilateral mediastinal lymphadenopathy as well as liver metastasis diagnosed in May of 2015.  Location(s) of Symptomatic tumor(s): increase in the size of the right liver metastatic lesion with progression of his second lesion in the lateral segment of the left liver  Past/Anticipated chemotherapy by medical oncology, if any: PRIOR THERAPY: 1) Tarceva 150 mg by mouth daily. Started 11/25/2013. Status post 5 months of treatment discontinued today secondary to intolerance with persistent paronychia. 2) Tarceva 100 mg by mouth daily. Started 06/03/2014. Status post 8 months of treatment.   CURRENT THERAPY: Tagrisso 80 mg by mouth daily status post 22 months of treatment. First dose was given 03/04/2015.   Patient's main complaints related to symptomatic tumor(s) are: None  Pain on a scale of 0-10 is: right side abdominal discomfort since biopsy but, improving   If Spine Met(s), symptoms, if any, include:  Bowel/Bladder retention or incontinence (please describe): no  Numbness or weakness in extremities (please describe): no  Current Decadron regimen, if applicable: no  Ambulatory status? Walker? Wheelchair?: ambulatory  SAFETY ISSUES:  Prior radiation? no  Pacemaker/ICD? no  Possible current pregnancy? no  Is the patient on methotrexate? no  Additional Complaints / other details:  66 year old female. Married. Referred patient to Dr. Tammi Klippel from radiation oncology for consideration of stereotactic body radiotherapy to the progressive liver lesion.  Patient's father had radiation for esophageal cancer thus, she is very anxious about the thought of needing radiation therapy.

## 2017-01-24 NOTE — Progress Notes (Signed)
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    ICD-10-CM   1. Metastases to the liver [facility]) C78.7 US Biopsy    HISTORY OF PRESENT ILLNESS: Debra Hodges is a 66 y.o. female seen at the request of Dr. Julien Nordmann. The patient was originally diagnosed with Stage IV NSCLC, adenocarcinoma of the left upper lobe with left hilar and bilateral mediastinal lymphadenopathy as well as liver metastasis in May 2015. She received Tarceva 150 mg by mouth daily starting 11/25/2013, status post 5 months of treatment, discontinued secondary to intolerance with persistent paronychia. 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 She is currently taking Tagrisso 80 mg by mouth daily with first dose given 03/04/2015, status post 22 months and has been tolerating this treatment very well.   Recent CT C/A/P on 01/20/2017 shows interval progression of the central right liver metastatic lesion with progression of a second lesion in the lateral segment left liver. Of note, stable appearance of irregular posterior left upper lobe lesion with tiny bilateral pulmonary nodules unchanged in the interval. The plan is to continue with her current systemic therapy with Tagrisso.  The patient is here for further evaluation and discussion of radiation treatment options in the management of her liver metastases.  PREVIOUS RADIATION THERAPY: No  PAST MEDICAL HISTORY:  Past Medical History:  Diagnosis Date  . High blood  pressure   . Liver lesion 06/26/2016  . Non-small cell carcinoma of lung, stage 4 ([place]) dx'd 10/2013      PAST SURGICAL HISTORY: Past Surgical History:  Procedure Laterality Date  . ABDOMINAL HYSTERECTOMY    . BREAST BIOPSY     Right breast  . VIDEO BRONCHOSCOPY Bilateral 10/27/2013   Procedure: VIDEO BRONCHOSCOPY WITH FLUORO;  Surgeon: Collene Gobble, MD;  Location: [facility];  Service: Cardiopulmonary;  Laterality: Bilateral;    FAMILY HISTORY:  Family History  Problem Relation Age of Onset  . High blood pressure Mother   . Cancer Mother        skin  . Cancer Father        esophageal  . Cancer Maternal Grandfather        breast    SOCIAL HISTORY: She continues to work full time in [employer]. Social History   Social History  . Marital status: Married    Spouse name: N/A  . Number of children: N/A  . Years of education: N/A   Occupational History  . accountant    Social History Main Topics  . Smoking status: Never Smoker  . Smokeless tobacco: Never Used  . Alcohol use No     Comment: social  . Drug use: No  . Sexual activity: Not Currently   Other Topics Concern  . Not on file   Social History Narrative  . No narrative on file    ALLERGIES: Penicillins  MEDICATIONS:  Current Outpatient Prescriptions  Medication Sig Dispense Refill  . acetaminophen (TYLENOL) 325 MG tablet Take 650 mg by mouth every 6 (six) hours as needed. Reported on 06/15/2015    .  calcium carbonate (OS-CAL) 600 MG TABS tablet Take 600 mg by mouth daily.     Marland Kitchen CLIMARA 0.1 MG/24HR patch 1 patch once a week.    . levothyroxine (SYNTHROID, LEVOTHROID) 50 MCG tablet Take 50 mcg by mouth daily.    Marland Kitchen loperamide (IMODIUM) 2 MG capsule Take by mouth as needed for diarrhea or loose stools. Reported on 11/22/2015    . metroNIDAZOLE (METROCREAM) 0.75 % cream Apply 1 application topically daily. Reported on 11/22/2015    . Multiple Vitamins-Calcium (ONE-A-DAY WOMENS PO) Take 1 tablet by mouth daily.     Marland Kitchen osimertinib mesylate (TAGRISSO) 80 MG tablet Take 1 tablet (80 mg total) by mouth daily. 30 tablet 2  . potassium chloride SA (K-DUR,KLOR-CON) 20 MEQ tablet 1 tablet 2 (two) times daily.    . Sodium Fluoride (CLINPRO 5000) 1.1 % PSTE Place 1 application onto teeth 3 (three) times daily.    Marland Kitchen spironolactone (ALDACTONE) 25 MG tablet 1 tablet daily.    . vitamin C (ASCORBIC ACID) 500 MG tablet Take 500 mg by mouth daily.     No current facility-administered medications for this encounter.     REVIEW OF SYSTEMS:  On review of systems, the patient reports that she is doing well overall. She denies any chest pain, shortness of breath, cough, fevers, chills, night sweats, unintended weight changes. She denies any bowel or bladder disturbances, and denies abdominal pain, nausea or vomiting. She reports right side abdominal discomfort since biopsy but, improving. She denies bowel/bladder retention or incontinence. She denies numbness or weakness in the upper or lower extremities.  A complete review of systems is obtained and is otherwise negative.  PHYSICAL EXAM:  Wt Readings from Last 3 Encounters:  01/25/17 216 lb 6.4 oz (98.2 kg)  01/22/17 217 lb 1.6 oz (98.5 kg)  12/20/16 216 lb (98 kg)   Temp Readings from Last 3 Encounters:  01/25/17 98 F (36.7 C) (Oral)  01/22/17 98.4 F (36.9 C) (Oral)  12/20/16 98.4 F (36.9 C) (Oral)   BP Readings from Last 3 Encounters:  01/25/17 136/62  01/22/17 140/62  12/20/16 136/63   Pulse Readings from Last 3 Encounters:  01/25/17 80  01/22/17 83  12/20/16 84   Pain Assessment Pain Score: 0-No pain/10  In general this is a well appearing caucasian female in no acute distress. She is alert and oriented x4 and appropriate throughout the examination. HEENT reveals that the patient is normocephalic, atraumatic. EOMs are intact. PERRLA. Skin is intact without any evidence of gross lesions. Cardiovascular exam reveals a regular rate and rhythm, no  clicks rubs or murmurs are auscultated. Chest is clear to auscultation bilaterally. Lymphatic assessment is performed and does not reveal any adenopathy in the cervical, supraclavicular, axillary, or inguinal chains. Abdomen has active bowel sounds in all quadrants and is intact. The abdomen is soft, non tender, non distended. Lower extremities are negative for pretibial pitting edema, deep calf tenderness, cyanosis or clubbing.  KPS = 100  100 - Normal; no complaints; no evidence of disease. 90   - Able to carry on normal activity; minor signs or symptoms of disease. 80   - Normal activity with effort; some signs or symptoms of disease. 43   - Cares for self; unable to carry on normal activity or to do active work. 60   - Requires occasional assistance, but is able to care for most of his personal needs. 50   - Requires considerable assistance and frequent medical care. 40   -  Disabled; requires special care and assistance. 3   - Severely disabled; hospital admission is indicated although death not imminent. 64   - Very sick; hospital admission necessary; active supportive treatment necessary. 10   - Moribund; fatal processes progressing rapidly. 0     - Dead  Karnofsky DA, Abelmann Lupton, Craver LS and Burchenal [facility] [phone]) The use of the nitrogen mustards in the palliative treatment of carcinoma: with particular reference to bronchogenic carcinoma Cancer 1 [id number]  LABORATORY DATA:  Lab Results  Component Value Date   WBC 9.1 01/19/2017   HGB 12.0 01/19/2017   HCT 36.4 01/19/2017   MCV 88.8 01/19/2017   PLT 176 01/19/2017   Lab Results  Component Value Date   NA 140 01/19/2017   K 4.2 01/19/2017   CO2 26 01/19/2017   Lab Results  Component Value Date   ALT 17 01/19/2017   AST 24 01/19/2017   ALKPHOS 86 01/19/2017   BILITOT 0.45 01/19/2017     RADIOGRAPHY: Ct Chest W Contrast  Result Date: 01/20/2017 CLINICAL DATA:  Lung cancer. EXAM: CT CHEST, ABDOMEN, AND PELVIS WITH  CONTRAST TECHNIQUE: Multidetector CT imaging of the chest, abdomen and pelvis was performed following the standard protocol during bolus administration of intravenous contrast. CONTRAST:  112m ISOVUE-300 IOPAMIDOL (ISOVUE-300) INJECTION 61% COMPARISON:  10/16/2016 FINDINGS: CT CHEST FINDINGS Cardiovascular: The heart size is normal. No pericardial effusion. No thoracic aortic aneurysm. Mediastinum/Nodes: No mediastinal lymphadenopathy. There is no hilar lymphadenopathy. The esophagus has normal imaging features. Tiny calcified right thyroid nodule stable. There is no axillary lymphadenopathy. Lungs/Pleura: 4 mm posterior right upper lobe pulmonary nodule is unchanged. Irregular posterior left upper lobe lesion measured previously at 2.8 x 1.8 cm now measures 2.9 x 1.8 cm. Tiny 2-3 mm left upper lobe nodule on image 30 is stable. Musculoskeletal: Bone windows reveal no worrisome lytic or sclerotic osseous lesions. CT ABDOMEN PELVIS FINDINGS Hepatobiliary: Central liver lesion seen on the prior study appears larger today measuring 3.9 x 2.8 cm on today's exam compared to 3.1 x 2.1 cm previously. With 2.5 cm lesion posterior aspect of the lateral segment left liver also has progressed from 1.0 cm on the prior study. Gallbladder is decompressed. No intrahepatic or extrahepatic biliary dilation. Pancreas: No focal mass lesion. No dilatation of the main duct. No intraparenchymal cyst. No peripancreatic edema. Spleen: Stable 9 mm hypoattenuating lesion in the spleen. Adrenals/Urinary Tract: No adrenal nodule or mass. 1-2 mm nonobstructing stone identified upper pole right kidney. 5 mm nonobstructing stone identified lower pole left kidney. 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 No evidence for hydroureter. The urinary bladder appears normal for the degree of distention. Stomach/Bowel: Stomach is nondistended. No gastric wall thickening. No evidence of outlet obstruction. Duodenum is normally  positioned as is the ligament of Treitz. No small bowel wall thickening. No small bowel dilatation. The terminal ileum is normal. The appendix is not visualized, but there is no edema or inflammation in the region of the cecum. No gross colonic mass. No colonic wall thickening. No substantial diverticular change. Vascular/Lymphatic: There is abdominal aortic atherosclerosis without aneurysm. There is no gastrohepatic or hepatoduodenal ligament lymphadenopathy. No intraperitoneal or retroperitoneal lymphadenopathy. No pelvic sidewall lymphadenopathy. Reproductive: Uterus is surgically absent. There is no adnexal mass. Other: No intraperitoneal free fluid. Musculoskeletal: Bone windows reveal no worrisome lytic or sclerotic osseous lesions. IMPRESSION: 1. Interval progression of the central right liver metastatic lesion with progression of a second lesion in the lateral  segment left liver. 2. Stable appearance irregular posterior left upper lobe lesion with tiny bilateral pulmonary nodules unchanged in the interval. 3. Bilateral nonobstructing renal stones. 4.  Aortic Atherosclerois (ICD10-170.0) Electronically Signed   By: Misty Stanley M.D.   On: 01/20/2017 11:44   Ct Abdomen Pelvis W Contrast  Result Date: 01/20/2017 CLINICAL DATA:  Lung cancer. EXAM: CT CHEST, ABDOMEN, AND PELVIS WITH CONTRAST TECHNIQUE: Multidetector CT imaging of the chest, abdomen and pelvis was performed following the standard protocol during bolus administration of intravenous contrast. CONTRAST:  185m ISOVUE-300 IOPAMIDOL (ISOVUE-300) INJECTION 61% COMPARISON:  10/16/2016 FINDINGS: CT CHEST FINDINGS Cardiovascular: The heart size is normal. No pericardial effusion. No thoracic aortic aneurysm. Mediastinum/Nodes: No mediastinal lymphadenopathy. There is no hilar lymphadenopathy. The esophagus has normal imaging features. Tiny calcified right thyroid nodule stable. There is no axillary lymphadenopathy. Lungs/Pleura: 4 mm posterior right  upper lobe pulmonary nodule is unchanged. Irregular posterior left upper lobe lesion measured previously at 2.8 x 1.8 cm now measures 2.9 x 1.8 cm. Tiny 2-3 mm left upper lobe nodule on image 30 is stable. Musculoskeletal: Bone windows reveal no worrisome lytic or sclerotic osseous lesions. CT ABDOMEN PELVIS FINDINGS Hepatobiliary: Central liver lesion seen on the prior study appears larger today measuring 3.9 x 2.8 cm on today's exam compared to 3.1 x 2.1 cm previously. With 2.5 cm lesion posterior aspect of the lateral segment left liver also has progressed from 1.0 cm on the prior study. Gallbladder is decompressed. No intrahepatic or extrahepatic biliary dilation. Pancreas: No focal mass lesion. No dilatation of the main duct. No intraparenchymal cyst. No peripancreatic edema. Spleen: Stable 9 mm hypoattenuating lesion in the spleen. Adrenals/Urinary Tract: No adrenal nodule or mass. 1-2 mm nonobstructing stone identified upper pole right kidney. 5 mm nonobstructing stone identified lower pole left kidney. 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 No evidence for hydroureter. The urinary bladder appears normal for the degree of distention. Stomach/Bowel: Stomach is nondistended. No gastric wall thickening. No evidence of outlet obstruction. Duodenum is normally positioned as is the ligament of Treitz. No small bowel wall thickening. No small bowel dilatation. The terminal ileum is normal. The appendix is not visualized, but there is no edema or inflammation in the region of the cecum. No gross colonic mass. No colonic wall thickening. No substantial diverticular change. Vascular/Lymphatic: There is abdominal aortic atherosclerosis without aneurysm. There is no gastrohepatic or hepatoduodenal ligament lymphadenopathy. No intraperitoneal or retroperitoneal lymphadenopathy. No pelvic sidewall lymphadenopathy. Reproductive: Uterus is surgically absent. There is no adnexal mass. Other: No  intraperitoneal free fluid. Musculoskeletal: Bone windows reveal no worrisome lytic or sclerotic osseous lesions. IMPRESSION: 1. Interval progression of the central right liver metastatic lesion with progression of a second lesion in the lateral segment left liver. 2. Stable appearance irregular posterior left upper lobe lesion with tiny bilateral pulmonary nodules unchanged in the interval. 3. Bilateral nonobstructing renal stones. 4.  Aortic Atherosclerois (ICD10-170.0) Electronically Signed   By: EMisty StanleyM.D.   On: 01/20/2017 11:44      IMPRESSION/PLAN: 1. 66y.o. woman with Stage IV (T1b, N3, M1b) non-small cell lung cancer, adenocarcinoma of the left upper lobe with liver metastases.  Today, we talked to the patient and family about the findings and workup thus far. We discussed the natural history of liver metastases from metastatic NSCLC and general treatment, highlighting the role of stereotactic body radiotherapy in the management of liver metastases. We discussed the available radiation techniques, and  focused on the details of logistics and delivery. We reviewed the anticipated acute and late sequelae associated with radiation in this setting. We discussed the need for placement of fiducial markers into the liver by interventional radiology to assist with treatment planning and accuracy. The patient was encouraged to ask questions that were answered to her satisfaction.  The patient would like to proceed with SBRT treatment to the liver metastases. We recommend a course of five treatments to the liver metastases. She will be scheduled for gold fiducial marker placement and CT simulation in the near future.   In a visit lasting 60 minutes, greater than 50% of the time was spent face to face discussing her disease and treatment options for radiotherapy to the liver metastases, and coordinating the patient's care.    Nicholos Johns, PA-C    Tyler Pita, MD  [facility] Direct Dial: [phone]  Fax: [phone] [email or web address]  Skype  [email or web address]  This document serves as a record of services personally performed by Tyler Pita, MD and Freeman Caldron, PA-C. It was created on their behalf by Arlyce Harman, a trained medical scribe. The creation of this record is based on the scribe's personal observations and the provider's statements to them. This document has been checked and approved by the attending provider.

## 2017-01-25 ENCOUNTER — Encounter: Payer: Self-pay | Admitting: Radiation Oncology

## 2017-01-25 ENCOUNTER — Ambulatory Visit
Admission: RE | Admit: 2017-01-25 | Discharge: 2017-01-25 | Disposition: A | Discharge status: Home / Self Care | Payer: Medicare Other | Source: Ambulatory Visit | Attending: Radiation Oncology | Admitting: Radiation Oncology

## 2017-01-25 DIAGNOSIS — Z880 Allergy status to penicillin: Secondary | ICD-10-CM | POA: Diagnosis not present

## 2017-01-25 DIAGNOSIS — I10 Essential (primary) hypertension: Secondary | ICD-10-CM | POA: Diagnosis not present

## 2017-01-25 DIAGNOSIS — Z8249 Family history of ischemic heart disease and other diseases of the circulatory system: Secondary | ICD-10-CM | POA: Diagnosis not present

## 2017-01-25 DIAGNOSIS — Z803 Family history of malignant neoplasm of breast: Secondary | ICD-10-CM | POA: Diagnosis not present

## 2017-01-25 DIAGNOSIS — Z510 Encounter for antineoplastic radiation therapy: Secondary | ICD-10-CM | POA: Insufficient documentation

## 2017-01-25 DIAGNOSIS — Z79899 Other long term (current) drug therapy: Secondary | ICD-10-CM | POA: Insufficient documentation

## 2017-01-25 DIAGNOSIS — Z800 Family history of malignant neoplasm of digestive organs: Secondary | ICD-10-CM | POA: Insufficient documentation

## 2017-01-25 DIAGNOSIS — I700 Atherosclerosis of aorta: Secondary | ICD-10-CM | POA: Diagnosis not present

## 2017-01-25 DIAGNOSIS — N200 Calculus of kidney: Secondary | ICD-10-CM | POA: Diagnosis not present

## 2017-01-25 DIAGNOSIS — Z9889 Other specified postprocedural states: Secondary | ICD-10-CM | POA: Insufficient documentation

## 2017-01-25 DIAGNOSIS — Z90710 Acquired absence of both cervix and uterus: Secondary | ICD-10-CM | POA: Insufficient documentation

## 2017-01-25 DIAGNOSIS — Z808 Family history of malignant neoplasm of other organs or systems: Secondary | ICD-10-CM | POA: Diagnosis not present

## 2017-01-25 DIAGNOSIS — C787 Secondary malignant neoplasm of liver and intrahepatic bile duct: Secondary | ICD-10-CM

## 2017-01-25 DIAGNOSIS — C3412 Malignant neoplasm of upper lobe, left bronchus or lung: Secondary | ICD-10-CM | POA: Diagnosis not present

## 2017-01-25 NOTE — Progress Notes (Signed)
See progress note under physician encounter. 

## 2017-01-30 ENCOUNTER — Telehealth: Payer: Self-pay | Admitting: *Deleted

## 2017-01-30 NOTE — Telephone Encounter (Signed)
Called patient to inform of fid. Marker placement on 02-02-17 @ 1 pm and his sim on 02-09-17 @ 2 pm @ Dr. Johny Shears ofice, lvm for a return call

## 2017-01-31 ENCOUNTER — Other Ambulatory Visit: Payer: Self-pay | Admitting: Radiology

## 2017-02-02 ENCOUNTER — Other Ambulatory Visit: Payer: Self-pay | Admitting: Urology

## 2017-02-02 ENCOUNTER — Ambulatory Visit (HOSPITAL_COMMUNITY)
Admission: RE | Admit: 2017-02-02 | Discharge: 2017-02-02 | Disposition: A | Discharge status: Home / Self Care | Payer: Medicare Other | Source: Ambulatory Visit | Attending: Urology | Admitting: Urology

## 2017-02-02 ENCOUNTER — Encounter (HOSPITAL_COMMUNITY): Payer: Self-pay

## 2017-02-02 DIAGNOSIS — Z880 Allergy status to penicillin: Secondary | ICD-10-CM | POA: Insufficient documentation

## 2017-02-02 DIAGNOSIS — Z90710 Acquired absence of both cervix and uterus: Secondary | ICD-10-CM | POA: Insufficient documentation

## 2017-02-02 DIAGNOSIS — Z79899 Other long term (current) drug therapy: Secondary | ICD-10-CM | POA: Diagnosis not present

## 2017-02-02 DIAGNOSIS — Z85118 Personal history of other malignant neoplasm of bronchus and lung: Secondary | ICD-10-CM | POA: Diagnosis not present

## 2017-02-02 DIAGNOSIS — K7689 Other specified diseases of liver: Secondary | ICD-10-CM | POA: Diagnosis not present

## 2017-02-02 DIAGNOSIS — C787 Secondary malignant neoplasm of liver and intrahepatic bile duct: Secondary | ICD-10-CM | POA: Insufficient documentation

## 2017-02-02 DIAGNOSIS — Z9889 Other specified postprocedural states: Secondary | ICD-10-CM | POA: Diagnosis not present

## 2017-02-02 LAB — PROTIME-INR
INR: 0.94
Prothrombin Time: 12.5 seconds (ref 11.4–15.2)

## 2017-02-02 LAB — CBC
HCT: 35.9 % — ABNORMAL LOW (ref 36.0–46.0)
HEMOGLOBIN: 12.3 g/dL (ref 12.0–15.0)
MCH: 29.3 pg (ref 26.0–34.0)
MCHC: 34.3 g/dL (ref 30.0–36.0)
MCV: 85.5 fL (ref 78.0–100.0)
Platelets: 202 10*3/uL (ref 150–400)
RBC: 4.2 MIL/uL (ref 3.87–5.11)
RDW: 13.8 % (ref 11.5–15.5)
WBC: 8.3 10*3/uL (ref 4.0–10.5)

## 2017-02-02 LAB — APTT: aPTT: 26 seconds (ref 24–36)

## 2017-02-02 MED ORDER — HYDROCODONE-ACETAMINOPHEN 5-325 MG PO TABS: 1.0000 | ORAL_TABLET | ORAL | Status: DC | PRN

## 2017-02-02 MED ORDER — FENTANYL CITRATE (PF) 100 MCG/2ML IJ SOLN
INTRAMUSCULAR | Status: AC
  Filled 2017-02-02: qty 4

## 2017-02-02 MED ORDER — LIDOCAINE HCL 2 % IJ SOLN
INTRAMUSCULAR | Status: AC
  Filled 2017-02-02: qty 10

## 2017-02-02 MED ORDER — SODIUM CHLORIDE 0.9 % IV SOLN
INTRAVENOUS | Status: DC
  Administered 2017-02-02: 12:00:00 via INTRAVENOUS

## 2017-02-02 MED ORDER — MIDAZOLAM HCL 2 MG/2ML IJ SOLN
INTRAMUSCULAR | Status: AC | PRN
  Administered 2017-02-02 (×5): 1 mg via INTRAVENOUS

## 2017-02-02 MED ORDER — MIDAZOLAM HCL 2 MG/2ML IJ SOLN
INTRAMUSCULAR | Status: AC
  Filled 2017-02-02: qty 4

## 2017-02-02 MED ORDER — FENTANYL CITRATE (PF) 100 MCG/2ML IJ SOLN
INTRAMUSCULAR | Status: AC | PRN
  Administered 2017-02-02 (×5): 50 ug via INTRAVENOUS

## 2017-02-02 NOTE — Sedation Documentation (Signed)
Patient to be moved to CT for better imaging. Patient with stable VSS, no complaints of pain. Will continue to monitor.

## 2017-02-02 NOTE — Sedation Documentation (Signed)
Patient denies pain and is resting comfortably.  

## 2017-02-02 NOTE — Sedation Documentation (Signed)
Pt still experiencing pain with needle placement. Meds given.

## 2017-02-02 NOTE — Sedation Documentation (Signed)
Time out information reviewed

## 2017-02-02 NOTE — Discharge Instructions (Signed)
Moderate Conscious Sedation, Adult, Care After These instructions provide you with information about caring for yourself after your procedure. Your health care provider may also give you more specific instructions. Your treatment has been planned according to current medical practices, but problems sometimes occur. Call your health care provider if you have any problems or questions after your procedure. What can I expect after the procedure? After your procedure, it is common:  To feel sleepy for several hours.  To feel clumsy and have poor balance for several hours.  To have poor judgment for several hours.  To vomit if you eat too soon.  Follow these instructions at home: For at least 24 hours after the procedure:   Do not: ? Participate in activities where you could fall or become injured. ? Drive. ? Use heavy machinery. ? Drink alcohol. ? Take sleeping pills or medicines that cause drowsiness. ? Make important decisions or sign legal documents. ? Take care of children on your own.  Rest. Eating and drinking  Follow the diet recommended by your health care provider.  If you vomit: ? Drink water, juice, or soup when you can drink without vomiting. ? Make sure you have little or no nausea before eating solid foods. General instructions  Have a responsible adult stay with you until you are awake and alert.  Take over-the-counter and prescription medicines only as told by your health care provider.  If you smoke, do not smoke without supervision.  Keep all follow-up visits as told by your health care provider. This is important. Contact a health care provider if:  You keep feeling nauseous or you keep vomiting.  You feel light-headed.  You develop a rash.  You have a fever. Get help right away if:  You have trouble breathing. This information is not intended to replace advice given to you by your health care provider. Make sure you discuss any questions you have  with your health care provider. Document Released: 03/05/2013 Document Revised: 10/18/2015 Document Reviewed: 09/04/2015 Elsevier Interactive Patient Education  2018 [employer].  Needle Entry for Marker Placement, Care After Refer to this sheet in the next few weeks. These instructions provide you with information about caring for yourself after your procedure. Your health care provider may also give you more specific instructions. Your treatment has been planned according to current medical practices, but problems sometimes occur. Call your health care provider if you have any problems or questions after your procedure. What can I expect after the procedure? After your procedure, it is common to have soreness, bruising, or mild pain at the biopsy site. This should go away in a few days. Follow these instructions at home:  Rest as directed by your health care provider.  Take medicines only as directed by your health care provider.  There are many different ways to close and cover the biopsy site, including stitches (sutures), skin glue, and adhesive strips. Follow your health care provider's instructions about:  Site care. ? Bandage (dressing) changes and removal. May remove bandaid and shower in 24 hours.  Keep area clean and dry. May replace bandaid as necessary.   Check your site every day for signs of infection. Watch for: ? Redness, swelling, or pain. ? Fluid, blood, or pus. Contact a health care provider if:  You have a fever.  You have redness, swelling, or pain at the biopsy site that lasts longer than a few days.  You have fluid, blood, or pus coming from the biopsy site.  You feel nauseous.  You vomit. Get help right away if:  You have shortness of breath.  You have trouble breathing.  You have chest pain.  You feel dizzy or you faint.  You have bleeding that does not stop with pressure or a bandage.  You cough up blood.  You have pain in your  abdomen. This information is not intended to replace advice given to you by your health care provider. Make sure you discuss any questions you have with your health care provider. Document Released: 09/29/2014 Document Revised: 10/21/2015 Document Reviewed: 05/11/2014 Elsevier Interactive Patient Education  [employer].

## 2017-02-02 NOTE — Sedation Documentation (Signed)
Pt c/o pain. Meds given.

## 2017-02-02 NOTE — H&P (Addendum)
Referring Physician(s): Bruning,Ashlyn/Manning,M  Supervising Physician: Arne Cleveland  Patient Status:  [facility]  Chief Complaint:  "I'm having markers put in my liver"  Subjective: Patient familiar to IR service from liver lesion biopsy on 10/27/16. 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 She presents today for image guided fiducial marker placement in liver tumors prior to SBRT.  Past Medical History:  Diagnosis Date  . High blood pressure   . Liver lesion 06/26/2016  . Non-small cell carcinoma of lung, stage 4 ([place]) dx'd 10/2013   Past Surgical History:  Procedure Laterality Date  . ABDOMINAL HYSTERECTOMY    . BREAST BIOPSY     Right breast  . VIDEO BRONCHOSCOPY Bilateral 10/27/2013   Procedure: VIDEO BRONCHOSCOPY WITH FLUORO;  Surgeon: Collene Gobble, MD;  Location: [facility];  Service: Cardiopulmonary;  Laterality: Bilateral;     Allergies: Penicillins  Medications: Prior to Admission medications   Medication Sig Start Date End Date Taking? Authorizing Provider  acetaminophen (TYLENOL) 325 MG tablet Take 650 mg by mouth every 6 (six) hours as needed. Reported on 06/15/2015   Yes [provider]  calcium carbonate (OS-CAL) 600 MG TABS tablet Take 600 mg by mouth daily.    Yes [provider]  CLIMARA 0.1 MG/24HR patch 1 patch once a week. 08/12/13  Yes [provider]  levothyroxine (SYNTHROID, LEVOTHROID) 50 MCG tablet Take 50 mcg by mouth daily. 11/13/16  Yes [provider]  loperamide (IMODIUM) 2 MG capsule Take by mouth as needed for diarrhea or loose stools. Reported on 11/22/2015   Yes [provider]  Multiple Vitamins-Calcium (ONE-A-DAY WOMENS PO) Take 1 tablet by mouth daily.   Yes [provider]  osimertinib mesylate (TAGRISSO) 80 MG tablet Take 1 tablet (80 mg total) by mouth daily. 12/06/16  Yes Curt Bears,  MD  potassium chloride SA (K-DUR,KLOR-CON) 20 MEQ tablet 1 tablet 2 (two) times daily. 07/22/13  Yes [provider]  Sodium Fluoride (CLINPRO 5000) 1.1 % PSTE Place 1 application onto teeth 3 (three) times daily.   Yes [provider]  spironolactone (ALDACTONE) 25 MG tablet 1 tablet daily. 10/02/13  Yes [provider]  vitamin C (ASCORBIC ACID) 500 MG tablet Take 500 mg by mouth daily.   Yes [provider]  metroNIDAZOLE (METROCREAM) 0.75 % cream Apply 1 application topically daily. Reported on 11/22/2015    [provider]     Vital Signs: Blood pressure 141/ 74, heart rate 82, respirations 16, temperature 98.3, O2 sat 100% room air   Physical Exam awake/alert; chest- CTA bilat; heart- RRR; abd- soft,+BS,NT; LE- FROM  Imaging: No results found.  Labs:  CBC:  Recent Labs  11/20/16 0837 12/20/16 0759 01/19/17 1551 02/02/17 1120  WBC 7.9 8.5 9.1 8.3  HGB 12.7 12.6 12.0 12.3  HCT 37.6 37.2 36.4 35.9*  PLT 202 198 176 202    COAGS:  Recent Labs  10/27/16 1124 02/02/17 1120  INR 0.94 0.94  APTT 25 26    BMP:  Recent Labs  10/16/16 1608 11/20/16 0837 12/20/16 0759 01/19/17 1551  NA 141 141 140 140  K 4.2 4.1 3.7 4.2  CO2 26 24 23 26   GLUCOSE 87 82 91 86  BUN 15.0 19.3 18.6 19.0  CALCIUM 9.8 9.5 9.2 9.8  CREATININE 0.9 0.9 0.9 0.9    LIVER FUNCTION TESTS:  Recent Labs  10/16/16 1608  11/20/16 0837 12/20/16 0759 01/19/17 1551  BILITOT 0.49 0.43 0.43 0.45  AST 26 27 19 24   ALT 20 22 15 17   ALKPHOS 82 70 79 86  PROT 8.1 7.5 7.4 7.8  ALBUMIN 4.2 3.9 3.8 3.8    Assessment and Plan: Pt with history of 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 She presents today for image guided fiducial marker placement in liver tumors prior to SBRT. Details/risks of procedure, including but not limited to, internal bleeding, infection, injury to adjacent  structures, discussed with patient/daughter with their understanding and consent.   Electronically Signed: D. Rowe Robert, PA-C 02/02/2017, 12:20 PM   I spent a total of 20 minutes at the the patient's bedside AND on the patient's hospital floor or unit, greater than 50% of which was counseling/coordinating care for image guided placement of fiducial markers in liver tumors

## 2017-02-02 NOTE — Procedures (Signed)
  Procedure:   CT guided liver fiducial marker placement x2   Preprocedure diagnosis:  Mets Postprocedure diagnosis:  same EBL:     minimal Complications:   none immediate  See full dictation in [facility].  Dillard Cannon MD Main # [phone] Pager  [phone]

## 2017-02-02 NOTE — Sedation Documentation (Signed)
Patient transported to CT via stretcher. Patient able to move self onto CT bed. VSS. No pain.

## 2017-02-09 ENCOUNTER — Ambulatory Visit
Admission: RE | Admit: 2017-02-09 | Discharge: 2017-02-09 | Disposition: A | Discharge status: Home / Self Care | Payer: Medicare Other | Source: Ambulatory Visit | Attending: Radiation Oncology | Admitting: Radiation Oncology

## 2017-02-09 DIAGNOSIS — Z9889 Other specified postprocedural states: Secondary | ICD-10-CM | POA: Diagnosis not present

## 2017-02-09 DIAGNOSIS — Z510 Encounter for antineoplastic radiation therapy: Secondary | ICD-10-CM | POA: Diagnosis not present

## 2017-02-09 DIAGNOSIS — C787 Secondary malignant neoplasm of liver and intrahepatic bile duct: Secondary | ICD-10-CM | POA: Diagnosis not present

## 2017-02-09 DIAGNOSIS — C3412 Malignant neoplasm of upper lobe, left bronchus or lung: Secondary | ICD-10-CM | POA: Diagnosis not present

## 2017-02-09 DIAGNOSIS — Z90710 Acquired absence of both cervix and uterus: Secondary | ICD-10-CM | POA: Diagnosis not present

## 2017-02-09 DIAGNOSIS — I10 Essential (primary) hypertension: Secondary | ICD-10-CM | POA: Diagnosis not present

## 2017-02-09 NOTE — Progress Notes (Signed)
  Radiation Oncology         [phone] ________________________________  Name: Debra Hodges MRN: [id number]  Date: 02/09/2017  DOB: 02-Dec-1950  STEREOTACTIC BODY RADIOTHERAPY SIMULATION AND TREATMENT PLANNING NOTE    ICD-10-CM   1. 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  NARRATIVE:  The patient was brought to the [place].  Identity was confirmed.  All relevant records and images related to the planned course of therapy were reviewed.  The patient freely provided informed written consent to proceed with treatment after reviewing the details related to the planned course of therapy. The consent form was witnessed and verified by the simulation staff.  Then, the patient was set-up in a stable reproducible  supine position for radiation therapy.  A BodyFix immobilization pillow was fabricated for reproducible positioning.  Surface markings were placed.  The CT images were loaded into the planning software.  The gross target volumes (GTV) and planning target volumes (PTV) were delinieated, and avoidance structures were contoured.  Treatment planning then occurred.  The radiation prescription was entered and confirmed.  A total of two complex treatment devices were fabricated in the form of the BodyFix immobilization pillow and a neck accuform cushion.  I have requested : 3D Simulation  I have requested a DVH of the following structures: targets and all normal structures near the target including normal liver, stomach, spinal cord, kidneys and target as noted on the radiation plan to maintain doses in adherence with established limits  PLAN:  The patient will receive 50 Gy in 5 fractions.  ________________________________  Sheral Apley Tammi Klippel, M.D.

## 2017-02-14 DIAGNOSIS — I10 Essential (primary) hypertension: Secondary | ICD-10-CM | POA: Diagnosis not present

## 2017-02-14 DIAGNOSIS — C787 Secondary malignant neoplasm of liver and intrahepatic bile duct: Secondary | ICD-10-CM | POA: Diagnosis not present

## 2017-02-14 DIAGNOSIS — Z9889 Other specified postprocedural states: Secondary | ICD-10-CM | POA: Diagnosis not present

## 2017-02-14 DIAGNOSIS — Z90710 Acquired absence of both cervix and uterus: Secondary | ICD-10-CM | POA: Diagnosis not present

## 2017-02-14 DIAGNOSIS — Z510 Encounter for antineoplastic radiation therapy: Secondary | ICD-10-CM | POA: Diagnosis not present

## 2017-02-14 DIAGNOSIS — C3412 Malignant neoplasm of upper lobe, left bronchus or lung: Secondary | ICD-10-CM | POA: Diagnosis not present

## 2017-02-21 ENCOUNTER — Ambulatory Visit
Admission: RE | Admit: 2017-02-21 | Discharge: 2017-02-21 | Disposition: A | Discharge status: Home / Self Care | Payer: Medicare Other | Source: Ambulatory Visit | Attending: Radiation Oncology | Admitting: Radiation Oncology

## 2017-02-21 DIAGNOSIS — Z510 Encounter for antineoplastic radiation therapy: Secondary | ICD-10-CM | POA: Diagnosis not present

## 2017-02-21 DIAGNOSIS — C787 Secondary malignant neoplasm of liver and intrahepatic bile duct: Secondary | ICD-10-CM | POA: Diagnosis not present

## 2017-02-21 DIAGNOSIS — Z9889 Other specified postprocedural states: Secondary | ICD-10-CM | POA: Diagnosis not present

## 2017-02-21 DIAGNOSIS — C3412 Malignant neoplasm of upper lobe, left bronchus or lung: Secondary | ICD-10-CM | POA: Diagnosis not present

## 2017-02-21 DIAGNOSIS — Z90710 Acquired absence of both cervix and uterus: Secondary | ICD-10-CM | POA: Diagnosis not present

## 2017-02-21 DIAGNOSIS — I10 Essential (primary) hypertension: Secondary | ICD-10-CM | POA: Diagnosis not present

## 2017-02-22 ENCOUNTER — Ambulatory Visit: Payer: Medicare Other | Admitting: Radiation Oncology

## 2017-02-23 ENCOUNTER — Ambulatory Visit
Admission: RE | Admit: 2017-02-23 | Discharge: 2017-02-23 | Disposition: A | Discharge status: Home / Self Care | Payer: Medicare Other | Source: Ambulatory Visit | Attending: Radiation Oncology | Admitting: Radiation Oncology

## 2017-02-23 DIAGNOSIS — Z9889 Other specified postprocedural states: Secondary | ICD-10-CM | POA: Diagnosis not present

## 2017-02-23 DIAGNOSIS — C787 Secondary malignant neoplasm of liver and intrahepatic bile duct: Secondary | ICD-10-CM | POA: Diagnosis not present

## 2017-02-23 DIAGNOSIS — Z90710 Acquired absence of both cervix and uterus: Secondary | ICD-10-CM | POA: Diagnosis not present

## 2017-02-23 DIAGNOSIS — Z510 Encounter for antineoplastic radiation therapy: Secondary | ICD-10-CM | POA: Diagnosis not present

## 2017-02-23 DIAGNOSIS — I10 Essential (primary) hypertension: Secondary | ICD-10-CM | POA: Diagnosis not present

## 2017-02-23 DIAGNOSIS — C3412 Malignant neoplasm of upper lobe, left bronchus or lung: Secondary | ICD-10-CM | POA: Diagnosis not present

## 2017-02-26 ENCOUNTER — Ambulatory Visit
Admission: RE | Admit: 2017-02-26 | Discharge: 2017-02-26 | Disposition: A | Discharge status: Home / Self Care | Payer: Medicare Other | Source: Ambulatory Visit | Attending: Radiation Oncology | Admitting: Radiation Oncology

## 2017-02-26 ENCOUNTER — Other Ambulatory Visit: Payer: Self-pay | Admitting: *Deleted

## 2017-02-26 ENCOUNTER — Other Ambulatory Visit: Payer: Self-pay | Admitting: Pharmacist

## 2017-02-26 DIAGNOSIS — C787 Secondary malignant neoplasm of liver and intrahepatic bile duct: Secondary | ICD-10-CM | POA: Diagnosis not present

## 2017-02-26 DIAGNOSIS — C3412 Malignant neoplasm of upper lobe, left bronchus or lung: Secondary | ICD-10-CM | POA: Diagnosis not present

## 2017-02-26 DIAGNOSIS — Z9889 Other specified postprocedural states: Secondary | ICD-10-CM | POA: Diagnosis not present

## 2017-02-26 DIAGNOSIS — Z510 Encounter for antineoplastic radiation therapy: Secondary | ICD-10-CM | POA: Diagnosis not present

## 2017-02-26 DIAGNOSIS — Z90710 Acquired absence of both cervix and uterus: Secondary | ICD-10-CM | POA: Diagnosis not present

## 2017-02-26 DIAGNOSIS — I10 Essential (primary) hypertension: Secondary | ICD-10-CM | POA: Diagnosis not present

## 2017-02-26 MED ORDER — OSIMERTINIB MESYLATE 80 MG PO TABS: 80.0000 mg | ORAL_TABLET | Freq: Every day | ORAL | 2 refills | Status: DC

## 2017-02-26 NOTE — Telephone Encounter (Signed)
Oral Oncology Pharmacist Encounter  New prescription for Tagrisso 80mg  tablets faxed to [facility] manufacturer assistance program at [phone].  Oral Oncology Clinic will continue to follow.  [name], PharmD, BCPS, BCOP 02/26/2017 1:34 PM Oral Oncology Clinic [phone]

## 2017-02-26 NOTE — Telephone Encounter (Signed)
Debra Hodges in Pharmacy requesting Paper script on [place] for pt assistance.

## 2017-02-27 ENCOUNTER — Ambulatory Visit: Payer: Medicare Other | Admitting: Radiation Oncology

## 2017-02-28 ENCOUNTER — Ambulatory Visit
Admission: RE | Admit: 2017-02-28 | Discharge: 2017-02-28 | Disposition: A | Discharge status: Home / Self Care | Payer: Medicare Other | Source: Ambulatory Visit | Attending: Radiation Oncology | Admitting: Radiation Oncology

## 2017-02-28 DIAGNOSIS — I10 Essential (primary) hypertension: Secondary | ICD-10-CM | POA: Diagnosis not present

## 2017-02-28 DIAGNOSIS — Z510 Encounter for antineoplastic radiation therapy: Secondary | ICD-10-CM | POA: Diagnosis not present

## 2017-02-28 DIAGNOSIS — C3412 Malignant neoplasm of upper lobe, left bronchus or lung: Secondary | ICD-10-CM | POA: Diagnosis not present

## 2017-02-28 DIAGNOSIS — C787 Secondary malignant neoplasm of liver and intrahepatic bile duct: Secondary | ICD-10-CM | POA: Diagnosis not present

## 2017-02-28 DIAGNOSIS — Z9889 Other specified postprocedural states: Secondary | ICD-10-CM | POA: Diagnosis not present

## 2017-02-28 DIAGNOSIS — Z90710 Acquired absence of both cervix and uterus: Secondary | ICD-10-CM | POA: Diagnosis not present

## 2017-03-01 ENCOUNTER — Telehealth: Payer: Self-pay | Admitting: Internal Medicine

## 2017-03-01 ENCOUNTER — Other Ambulatory Visit (HOSPITAL_BASED_OUTPATIENT_CLINIC_OR_DEPARTMENT_OTHER): Payer: Medicare Other

## 2017-03-01 ENCOUNTER — Ambulatory Visit (HOSPITAL_BASED_OUTPATIENT_CLINIC_OR_DEPARTMENT_OTHER): Payer: Medicare Other | Admitting: Internal Medicine

## 2017-03-01 VITALS — BP 126/68 | HR 85 | Temp 98.5°F | Resp 18 | Ht 66.0 in | Wt 217.4 lb

## 2017-03-01 DIAGNOSIS — Z5111 Encounter for antineoplastic chemotherapy: Secondary | ICD-10-CM

## 2017-03-01 DIAGNOSIS — R110 Nausea: Secondary | ICD-10-CM | POA: Diagnosis not present

## 2017-03-01 DIAGNOSIS — C3412 Malignant neoplasm of upper lobe, left bronchus or lung: Secondary | ICD-10-CM

## 2017-03-01 DIAGNOSIS — C787 Secondary malignant neoplasm of liver and intrahepatic bile duct: Secondary | ICD-10-CM | POA: Diagnosis not present

## 2017-03-01 DIAGNOSIS — C3492 Malignant neoplasm of unspecified part of left bronchus or lung: Secondary | ICD-10-CM

## 2017-03-01 DIAGNOSIS — C771 Secondary and unspecified malignant neoplasm of intrathoracic lymph nodes: Secondary | ICD-10-CM

## 2017-03-01 DIAGNOSIS — K769 Liver disease, unspecified: Secondary | ICD-10-CM

## 2017-03-01 LAB — CBC WITH DIFFERENTIAL/PLATELET
BASO%: 0 % (ref 0.0–2.0)
Basophils Absolute: 0 10*3/uL (ref 0.0–0.1)
EOS ABS: 0.1 10*3/uL (ref 0.0–0.5)
EOS%: 2.1 % (ref 0.0–7.0)
HCT: 36.8 % (ref 34.8–46.6)
HGB: 12.2 g/dL (ref 11.6–15.9)
LYMPH%: 12.2 % — AB (ref 14.0–49.7)
MCH: 29.5 pg (ref 25.1–34.0)
MCHC: 33.2 g/dL (ref 31.5–36.0)
MCV: 89.1 fL (ref 79.5–101.0)
MONO#: 0.7 10*3/uL (ref 0.1–0.9)
MONO%: 11.6 % (ref 0.0–14.0)
NEUT%: 74.1 % (ref 38.4–76.8)
NEUTROS ABS: 4.1 10*3/uL (ref 1.5–6.5)
Platelets: 150 10*3/uL (ref 145–400)
RBC: 4.13 10*6/uL (ref 3.70–5.45)
RDW: 13.9 % (ref 11.2–14.5)
WBC: 5.6 10*3/uL (ref 3.9–10.3)
lymph#: 0.7 10*3/uL — ABNORMAL LOW (ref 0.9–3.3)

## 2017-03-01 LAB — COMPREHENSIVE METABOLIC PANEL
ALBUMIN: 3.8 g/dL (ref 3.5–5.0)
ALK PHOS: 85 U/L (ref 40–150)
ALT: 18 U/L (ref 0–55)
AST: 26 U/L (ref 5–34)
Anion Gap: 8 mEq/L (ref 3–11)
BILIRUBIN TOTAL: 0.5 mg/dL (ref 0.20–1.20)
BUN: 12.2 mg/dL (ref 7.0–26.0)
CO2: 25 meq/L (ref 22–29)
CREATININE: 0.8 mg/dL (ref 0.6–1.1)
Calcium: 9.2 mg/dL (ref 8.4–10.4)
Chloride: 106 mEq/L (ref 98–109)
EGFR: 80 mL/min/{1.73_m2} — ABNORMAL LOW (ref >90)
GLUCOSE: 103 mg/dL (ref 70–140)
Potassium: 3.7 mEq/L (ref 3.5–5.1)
SODIUM: 139 meq/L (ref 136–145)
TOTAL PROTEIN: 7.5 g/dL (ref 6.4–8.3)

## 2017-03-01 NOTE — Progress Notes (Signed)
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  PRIOR THERAPY: 1) Tarceva 150 mg by mouth daily. Started 11/25/2013. Status post 5 months of treatment discontinued today secondary to intolerance with persistent paronychia. 2) Tarceva 100 mg by mouth daily. Started 06/03/2014. Status post 8 months of treatment.  3) stereotactic radiotherapy to liver lesions under the care of Dr. Tammi Klippel. Last dose 03/02/2017  CURRENT THERAPY: Tagrisso 80 mg by mouth daily status post 23 months of treatment. First dose was given 03/04/2015.  INTERVAL HISTORY: Debra Hodges 66 y.o. female returns to the clinic today for follow-up visit accompanied by her daughter. The patient is feeling fine today with no specific complaints except for mild nausea. She is currently undergoing stereotactic radiotherapy to the metastatic liver lesions under the care of Dr. Tammi Klippel. She is tolerating this treatment fairly well. She denied having any fever or chills. She has no chest pain, shortness of breath, cough or hemoptysis. She says today for reevaluation with repeat blood work.  MEDICAL HISTORY: Past Medical History:  Diagnosis Date  . High blood pressure   . Liver lesion 06/26/2016  . Non-small cell carcinoma of lung, stage 4 ([place]) dx'd 10/2013    ALLERGIES:  is allergic to penicillins.  MEDICATIONS:  Current Outpatient Prescriptions  Medication Sig Dispense Refill  . acetaminophen (TYLENOL) 325 MG tablet Take 650 mg by mouth every 6 (six) hours as needed. Reported on 06/15/2015    .  calcium carbonate (OS-CAL) 600 MG TABS tablet Take 600 mg by mouth daily.     Marland Kitchen CLIMARA 0.1 MG/24HR patch 1 patch once a week.    . levothyroxine (SYNTHROID, LEVOTHROID) 50 MCG tablet Take 50 mcg by mouth daily.    Marland Kitchen loperamide (IMODIUM) 2 MG capsule Take by mouth as needed for diarrhea or loose stools. Reported on 11/22/2015    . metroNIDAZOLE (METROCREAM) 0.75 % cream Apply 1 application topically daily. Reported on 11/22/2015    . Multiple Vitamins-Calcium (ONE-A-DAY WOMENS PO) Take 1 tablet by mouth daily.    Marland Kitchen osimertinib mesylate (TAGRISSO) 80 MG tablet Take 1 tablet (80 mg total) by mouth daily. 30 tablet 2  . potassium chloride SA (K-DUR,KLOR-CON) 20 MEQ tablet 1 tablet 2 (two) times daily.    . Sodium Fluoride (CLINPRO 5000) 1.1 % PSTE Place 1 application onto teeth 3 (three) times daily.    Marland Kitchen spironolactone (ALDACTONE) 25 MG tablet 1 tablet daily.    . vitamin C (ASCORBIC ACID) 500 MG tablet Take 500 mg by mouth daily.     No current facility-administered medications for this visit.     SURGICAL HISTORY:  Past Surgical History:  Procedure Laterality Date  . ABDOMINAL HYSTERECTOMY    . BREAST BIOPSY     Right breast  . VIDEO BRONCHOSCOPY Bilateral 10/27/2013   Procedure: VIDEO BRONCHOSCOPY WITH FLUORO;  Surgeon: Collene Gobble, MD;  Location: [facility];  Service: Cardiopulmonary;  Laterality: Bilateral;    REVIEW OF SYSTEMS:  A comprehensive review of systems was negative except for: Gastrointestinal: positive for nausea   PHYSICAL EXAMINATION: General appearance:  alert, cooperative and no distress Head: Normocephalic, without obvious abnormality, atraumatic Neck: no adenopathy, no JVD, supple, symmetrical, trachea midline and thyroid not enlarged, symmetric, no tenderness/mass/nodules Lymph nodes: Cervical, supraclavicular, and axillary nodes normal. Resp: clear to auscultation bilaterally Back: symmetric, no curvature. ROM normal. No CVA tenderness. Cardio: regular rate and  rhythm, S1, S2 normal, no murmur, click, rub or gallop GI: soft, non-tender; bowel sounds normal; no masses,  no organomegaly Extremities: extremities normal, atraumatic, no cyanosis or edema  ECOG PERFORMANCE STATUS: 0 - Asymptomatic  Blood pressure 126/68, pulse 85, temperature 98.5 F (36.9 C), temperature source Oral, resp. rate 18, height '5\' 6"'  (1.676 m), weight 217 lb 6.4 oz (98.6 kg), SpO2 100 %.  LABORATORY DATA: Lab Results  Component Value Date   WBC 5.6 03/01/2017   HGB 12.2 03/01/2017   HCT 36.8 03/01/2017   MCV 89.1 03/01/2017   PLT 150 03/01/2017      Chemistry      Component Value Date/Time   NA 140 01/19/2017 1551   K 4.2 01/19/2017 1551   CO2 26 01/19/2017 1551   BUN 19.0 01/19/2017 1551   CREATININE 0.9 01/19/2017 1551      Component Value Date/Time   CALCIUM 9.8 01/19/2017 1551   ALKPHOS 86 01/19/2017 1551   AST 24 01/19/2017 1551   ALT 17 01/19/2017 1551   BILITOT 0.45 01/19/2017 1551       RADIOGRAPHIC STUDIES: Ct Biopsy  Result Date: February 06, 2017 CLINICAL DATA:  Metastatic lung carcinoma with 2 liver lesions. SBRT to the liver metastases is planned, and fiducial marker placement requested. EXAM: CT GUIDED LIVER FIDUCIAL MARKER PLACEMENT X2 ANESTHESIA/SEDATION: Intravenous Fentanyl and Versed were administered as conscious sedation during continuous monitoring of the patient's level of consciousness and physiological / cardiorespiratory status by the radiology RN, with a total moderate sedation time of 51 minutes. PROCEDURE: The procedure risks, benefits, and alternatives were explained to the patient. Questions regarding the procedure were encouraged and answered. The patient understands and consents to the procedure. Initially ultrasound was used to localize the lesions but only the segment 4A lesion could be visualized. Therefore, the patient was transferred to CT. Select axial scans through the liver were obtained. Both lesions were visualized, marker  placement sites were determined, and appropriate skin entry sites were marked. The operative fields prepped with chlorhexidinein a sterile fashion, and a sterile drape was applied covering the operative field. A sterile gown and sterile gloves were used for the procedure. Local anesthesia was provided with 1% Lidocaine. Under CT fluoroscopic guidance, the delivery needle of gold fiducial marker was advanced into the segment 4A lesion, and the marker deployed. Under CT fluoroscopic guidance, the delivery needle of gold fiducial marker was advanced into the segment 2 lesion, and the marker deployed. Axial CT confirmed appropriate positioning of the 2 markers. No evidence of hemorrhage or other apparent complication. The patient tolerated the procedure well. COMPLICATIONS: None immediate FINDINGS: Low-attenuation lesions in hepatic segments 2 and 4A were localized. Gold fiducial markers deployed under fluoroscopic guidance and both lesions as above. IMPRESSION: 1. Technically successful gold fiducial marker placement in the hepatic segment 4A lesion under CT guidance. 2. Technically successful gold fiducial marker placement in the hepatic segment 2 lesion under CT guidance. Electronically Signed   By: Lucrezia Europe M.D.   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 The patient is currently undergoing treatment with Tagrisso 80 mg by mouth daily status post 23 months and has been tolerating this treatment better. She is also undergoing stereotactic radiotherapy to the metastatic liver lesions and expected to complete this course tomorrow. I recommended for the patient to continue her current treatment  with Tagrisso for now. I will see her back for follow-up visit in one month for reevaluation with repeat blood work. I will consider repeating her imaging studies in 2 months for restaging of her disease. She was advised to call immediately if she has any concerning symptoms in the interval. The patient voices understanding of current disease status and treatment options and is in agreement with the current care plan. All questions were answered. The patient knows to call the clinic with any problems, questions or concerns. We can certainly see the patient much sooner if necessary.  Disclaimer: This note was dictated with voice recognition software. Similar sounding words can inadvertently be transcribed and may not be corrected upon review.

## 2017-03-01 NOTE — Telephone Encounter (Signed)
Scheduled appt per 10/4 los - Gave patient AVS and calender per los.  

## 2017-03-02 ENCOUNTER — Encounter: Payer: Self-pay | Admitting: Internal Medicine

## 2017-03-02 ENCOUNTER — Ambulatory Visit
Admission: RE | Admit: 2017-03-02 | Discharge: 2017-03-02 | Disposition: A | Discharge status: Home / Self Care | Payer: Medicare Other | Source: Ambulatory Visit | Attending: Radiation Oncology | Admitting: Radiation Oncology

## 2017-03-02 ENCOUNTER — Encounter: Payer: Self-pay | Admitting: Radiation Oncology

## 2017-03-02 DIAGNOSIS — Z90710 Acquired absence of both cervix and uterus: Secondary | ICD-10-CM | POA: Diagnosis not present

## 2017-03-02 DIAGNOSIS — Z510 Encounter for antineoplastic radiation therapy: Secondary | ICD-10-CM | POA: Diagnosis not present

## 2017-03-02 DIAGNOSIS — I10 Essential (primary) hypertension: Secondary | ICD-10-CM | POA: Diagnosis not present

## 2017-03-02 DIAGNOSIS — Z9889 Other specified postprocedural states: Secondary | ICD-10-CM | POA: Diagnosis not present

## 2017-03-02 DIAGNOSIS — C787 Secondary malignant neoplasm of liver and intrahepatic bile duct: Secondary | ICD-10-CM | POA: Diagnosis not present

## 2017-03-02 DIAGNOSIS — C3412 Malignant neoplasm of upper lobe, left bronchus or lung: Secondary | ICD-10-CM | POA: Diagnosis not present

## 2017-03-02 NOTE — Progress Notes (Signed)
  Radiation Oncology         [phone]) [phone] ________________________________  Name: Dan Dissinger MRN: [id number]  Date: 03/02/2017  DOB: [birth year]  End of Treatment Note  Diagnosis:   66 y.o. 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  Beams/energy:   SBRT/SRT-VMAT // 10X-FFF Photon  The treatment was gated with patient respiration using implanted fiducial markers.  Narrative: The patient tolerated radiation treatment relatively well.   She reported having fatigue and nausea over the course of her treatment with sinus drainage that may have caused the nausea. She denied any pain and had no skin irritation.  Plan: The patient has completed radiation treatment. The patient will return to radiation oncology clinic for routine followup in one month. I advised her to call or return sooner if she has any questions or concerns related to her recovery or treatment. ________________________________  Sheral Apley. Tammi Klippel, M.D.  This document serves as a record of services personally performed by Tyler Pita, MD. It was created on his behalf by Rae Lips, a trained medical scribe. The creation of this record is based on the scribe's personal observations and the provider's statements to them. This document has been checked and approved by the attending provider.

## 2017-03-29 ENCOUNTER — Other Ambulatory Visit (HOSPITAL_BASED_OUTPATIENT_CLINIC_OR_DEPARTMENT_OTHER): Payer: Medicare Other

## 2017-03-29 ENCOUNTER — Telehealth: Payer: Self-pay | Admitting: Internal Medicine

## 2017-03-29 ENCOUNTER — Ambulatory Visit (HOSPITAL_BASED_OUTPATIENT_CLINIC_OR_DEPARTMENT_OTHER): Payer: Medicare Other | Admitting: Oncology

## 2017-03-29 ENCOUNTER — Encounter: Payer: Self-pay | Admitting: Oncology

## 2017-03-29 VITALS — BP 136/80 | HR 82 | Temp 97.9°F | Resp 20 | Ht 66.0 in | Wt 216.3 lb

## 2017-03-29 DIAGNOSIS — C778 Secondary and unspecified malignant neoplasm of lymph nodes of multiple regions: Secondary | ICD-10-CM

## 2017-03-29 DIAGNOSIS — C3412 Malignant neoplasm of upper lobe, left bronchus or lung: Secondary | ICD-10-CM

## 2017-03-29 DIAGNOSIS — C787 Secondary malignant neoplasm of liver and intrahepatic bile duct: Secondary | ICD-10-CM | POA: Diagnosis not present

## 2017-03-29 DIAGNOSIS — C3492 Malignant neoplasm of unspecified part of left bronchus or lung: Secondary | ICD-10-CM

## 2017-03-29 DIAGNOSIS — Z5111 Encounter for antineoplastic chemotherapy: Secondary | ICD-10-CM

## 2017-03-29 LAB — COMPREHENSIVE METABOLIC PANEL
ALBUMIN: 3.5 g/dL (ref 3.5–5.0)
ALK PHOS: 97 U/L (ref 40–150)
ALT: 38 U/L (ref 0–55)
AST: 45 U/L — AB (ref 5–34)
Anion Gap: 8 mEq/L (ref 3–11)
BILIRUBIN TOTAL: 0.67 mg/dL (ref 0.20–1.20)
BUN: 12 mg/dL (ref 7.0–26.0)
CALCIUM: 9.4 mg/dL (ref 8.4–10.4)
CO2: 25 mEq/L (ref 22–29)
CREATININE: 0.8 mg/dL (ref 0.6–1.1)
Chloride: 106 mEq/L (ref 98–109)
EGFR: >60 mL/min/{1.73_m2} (ref >60)
Glucose: 100 mg/dl (ref 70–140)
Potassium: 3.6 mEq/L (ref 3.5–5.1)
Sodium: 138 mEq/L (ref 136–145)
TOTAL PROTEIN: 7.3 g/dL (ref 6.4–8.3)

## 2017-03-29 LAB — CBC WITH DIFFERENTIAL/PLATELET
BASO%: 0.2 % (ref 0.0–2.0)
BASOS ABS: 0 10*3/uL (ref 0.0–0.1)
EOS%: 2.5 % (ref 0.0–7.0)
Eosinophils Absolute: 0.1 10*3/uL (ref 0.0–0.5)
HEMATOCRIT: 37.2 % (ref 34.8–46.6)
HEMOGLOBIN: 12.6 g/dL (ref 11.6–15.9)
LYMPH%: 23.7 % (ref 14.0–49.7)
MCH: 30.2 pg (ref 25.1–34.0)
MCHC: 33.9 g/dL (ref 31.5–36.0)
MCV: 89.2 fL (ref 79.5–101.0)
MONO#: 0.6 10*3/uL (ref 0.1–0.9)
MONO%: 14.3 % — ABNORMAL HIGH (ref 0.0–14.0)
NEUT%: 59.3 % (ref 38.4–76.8)
NEUTROS ABS: 2.6 10*3/uL (ref 1.5–6.5)
Platelets: 106 10*3/uL — ABNORMAL LOW (ref 145–400)
RBC: 4.17 10*6/uL (ref 3.70–5.45)
RDW: 15.3 % — AB (ref 11.2–14.5)
WBC: 4.3 10*3/uL (ref 3.9–10.3)
lymph#: 1 10*3/uL (ref 0.9–3.3)
nRBC: 0 % (ref 0–0)

## 2017-03-29 NOTE — Assessment & Plan Note (Signed)
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 The patient is currently undergoing treatment with Tagrisso 80 mg by mouth daily status post 24 months and has been tolerating this treatment better. She recently completed stereotactic radiotherapy to the metastatic liver lesions. I recommended for the patient to continue her current treatment with Tagrisso. Platelets are slightly low which may be related to the Tagrisso. We will continue to monitor this. The patient will have a follow-up visit in one month for repeat lab work and a restaging CT scan of the chest, abdomen, and pelvis.  She was advised to call immediately if she has any concerning symptoms in the interval. The patient voices understanding of current disease status and treatment options and is in agreement with the current care plan. All questions were answered. The patient knows to call the clinic with any problems, questions or concerns. We can certainly see the patient much sooner if necessary. 

## 2017-03-29 NOTE — Telephone Encounter (Signed)
Gave patient avs report and appointments for December. Central radiology will call re scan.

## 2017-03-29 NOTE — Progress Notes (Signed)
[place] OFFICE PROGRESS NOTE  System, Pcp Not In No address on file  DIAGNOSIS: Stage IV (T1b, N3, M1b) non-small cell lung cancer, adenocarcinoma with positive EGFR mutation in exon 21 (L858R) and recently found to have T790M resistant mutation, presented with left upper lobe lung mass in addition to left hilar and bilateral mediastinal lymphadenopathy as well as liver metastasis diagnosed in May of 2015.  PRIOR THERAPY: 1) Tarceva 150 mg by mouth daily. Started 11/25/2013. Status post 5 months of treatment discontinued today secondary to intolerance with persistent paronychia. 2) Tarceva 100 mg by mouth daily. Started 06/03/2014. Status post 8 months of treatment.  3) stereotactic radiotherapy to liver lesions under the care of Dr. Tammi Klippel. Last dose 03/02/2017  CURRENT THERAPY: Tagrisso 80 mg by mouth daily status post 24 months of treatment. First dose was given 03/04/2015.  INTERVAL HISTORY: Debra Hodges 66 y.o. female returns for routine follow-up visit by herself. The patient is feeling fine today with no specific complaints. She completed radiation to her metastatic liver lesions on 03/02/2017. She is recovering well from her treatment. She denies fevers and chills. Denies chest pain, shortness breath, cough, hemoptysis. Denies nausea, vomiting, constipation, diarrhea. Denies skin rashes. The patient is here for reevaluation and repeat blood work.  MEDICAL HISTORY: Past Medical History:  Diagnosis Date  . High blood pressure   . Liver lesion 06/26/2016  . Non-small cell carcinoma of lung, stage 4 ([place]) dx'd 10/2013    ALLERGIES:  is allergic to penicillins.  MEDICATIONS:  Current Outpatient Prescriptions  Medication Sig Dispense Refill  . acetaminophen (TYLENOL) 325 MG tablet Take 650 mg by mouth every 6 (six) hours as needed. Reported on 06/15/2015    . calcium carbonate (OS-CAL) 600 MG TABS tablet Take 600 mg by mouth daily.     Marland Kitchen CLIMARA 0.1 MG/24HR patch  1 patch once a week.    . levothyroxine (SYNTHROID, LEVOTHROID) 50 MCG tablet Take 50 mcg by mouth daily.    Marland Kitchen loperamide (IMODIUM) 2 MG capsule Take by mouth as needed for diarrhea or loose stools. Reported on 11/22/2015    . metroNIDAZOLE (METROCREAM) 0.75 % cream Apply 1 application topically daily. Reported on 11/22/2015    . Multiple Vitamins-Calcium (ONE-A-DAY WOMENS PO) Take 1 tablet by mouth daily.    Marland Kitchen osimertinib mesylate (TAGRISSO) 80 MG tablet Take 1 tablet (80 mg total) by mouth daily. 30 tablet 2  . potassium chloride SA (K-DUR,KLOR-CON) 20 MEQ tablet 1 tablet 2 (two) times daily.    . Sodium Fluoride (CLINPRO 5000) 1.1 % PSTE Place 1 application onto teeth 3 (three) times daily.    Marland Kitchen spironolactone (ALDACTONE) 25 MG tablet 1 tablet daily.    . vitamin C (ASCORBIC ACID) 500 MG tablet Take 500 mg by mouth daily.     No current facility-administered medications for this visit.     SURGICAL HISTORY:  Past Surgical History:  Procedure Laterality Date  . ABDOMINAL HYSTERECTOMY    . BREAST BIOPSY     Right breast  . VIDEO BRONCHOSCOPY Bilateral 10/27/2013   Procedure: VIDEO BRONCHOSCOPY WITH FLUORO;  Surgeon: Collene Gobble, MD;  Location: [facility];  Service: Cardiopulmonary;  Laterality: Bilateral;    REVIEW OF SYSTEMS:   Review of Systems  Constitutional: Negative for appetite change, chills, fatigue, fever and unexpected weight change.  HENT:   Negative for mouth sores, nosebleeds, sore throat and trouble swallowing.   Eyes: Negative for eye problems and icterus.  Respiratory: Negative  for cough, hemoptysis, shortness of breath and wheezing.   Cardiovascular: Negative for chest pain and leg swelling.  Gastrointestinal: Negative for abdominal pain, constipation, diarrhea, nausea and vomiting.  Genitourinary: Negative for bladder incontinence, difficulty urinating, dysuria, frequency and hematuria.   Musculoskeletal: Negative for back pain, gait problem, neck pain and neck  stiffness.  Skin: Negative for itching and rash.  Neurological: Negative for dizziness, extremity weakness, gait problem, headaches, light-headedness and seizures.  Hematological: Negative for adenopathy. Does not bruise/bleed easily.  Psychiatric/Behavioral: Negative for confusion, depression and sleep disturbance. The patient is not nervous/anxious.     PHYSICAL EXAMINATION:  Blood pressure 136/80, pulse 82, temperature 97.9 F (36.6 C), temperature source Oral, resp. rate 20, height '5\' 6"'$  (1.676 m), weight 216 lb 4.8 oz (98.1 kg), SpO2 98 %.  ECOG PERFORMANCE STATUS: 1 - Symptomatic but completely ambulatory  Physical Exam  Constitutional: Oriented to person, place, and time and well-developed, well-nourished, and in no distress. No distress.  HENT:  Head: Normocephalic and atraumatic.  Mouth/Throat: Oropharynx is clear and moist. No oropharyngeal exudate.  Eyes: Conjunctivae are normal. Right eye exhibits no discharge. Left eye exhibits no discharge. No scleral icterus.  Neck: Normal range of motion. Neck supple.  Cardiovascular: Normal rate, regular rhythm, normal heart sounds and intact distal pulses.   Pulmonary/Chest: Effort normal and breath sounds normal. No respiratory distress. No wheezes. No rales.  Abdominal: Soft. Bowel sounds are normal. Exhibits no distension and no mass. There is no tenderness.  Musculoskeletal: Normal range of motion. Exhibits no edema.  Lymphadenopathy:    No cervical adenopathy.  Neurological: Alert and oriented to person, place, and time. Exhibits normal muscle tone. Gait normal. Coordination normal.  Skin: Skin is warm and dry. No rash noted. Not diaphoretic. No erythema. No pallor.  Psychiatric: Mood, memory and judgment normal.  Vitals reviewed.  LABORATORY DATA: Lab Results  Component Value Date   WBC 4.3 03/29/2017   HGB 12.6 03/29/2017   HCT 37.2 03/29/2017   MCV 89.2 03/29/2017   PLT 106 (L) 03/29/2017      Chemistry       Component Value Date/Time   NA 138 03/29/2017 0840   K 3.6 03/29/2017 0840   CO2 25 03/29/2017 0840   BUN 12.0 03/29/2017 0840   CREATININE 0.8 03/29/2017 0840      Component Value Date/Time   CALCIUM 9.4 03/29/2017 0840   ALKPHOS 97 03/29/2017 0840   AST 45 (H) 03/29/2017 0840   ALT 38 03/29/2017 0840   BILITOT 0.67 03/29/2017 0840       RADIOGRAPHIC STUDIES:  No results found.   ASSESSMENT/PLAN:  Malignant neoplasm of upper lobe of left lung This is a very pleasant 66 year old white female with a stage IV non-small cell lung cancer, adenocarcinoma with positive EGFR mutation in exon 21 (L858R) diagnosed in May 2015 status post treatment with Tarceva for 13 months discontinued secondary to disease progression and development of T790M resistant mutation. The patient is currently undergoing treatment with Tagrisso 80 mg by mouth daily status post 24 months and has been tolerating this treatment better. She recently completed stereotactic radiotherapy to the metastatic liver lesions. I recommended for the patient to continue her current treatment with Tagrisso. Platelets are slightly low which may be related to the [place]. We will continue to monitor this. The patient will have a follow-up visit in one month for repeat lab work and a restaging CT scan of the chest, abdomen, and pelvis.  She  was advised to call immediately if she has any concerning symptoms in the interval. The patient voices understanding of current disease status and treatment options and is in agreement with the current care plan. All questions were answered. The patient knows to call the clinic with any problems, questions or concerns. We can certainly see the patient much sooner if necessary.  Orders Placed This Encounter  Procedures  . CT ABDOMEN PELVIS W CONTRAST    Standing Status:   Future    Standing Expiration Date:   03/29/2018    Order Specific Question:   If indicated for the ordered procedure, I  authorize the administration of contrast media per Radiology protocol    Answer:   Yes    Order Specific Question:   Preferred imaging location?    Answer:   [facility]    Order Specific Question:   Radiology Contrast Protocol - do NOT remove file path    Answer:   \\[email or web address]    Order Specific Question:   Reason for Exam additional comments    Answer:   lung cancer. restaging.  . CT CHEST W CONTRAST    Standing Status:   Future    Standing Expiration Date:   03/29/2018    Order Specific Question:   If indicated for the ordered procedure, I authorize the administration of contrast media per Radiology protocol    Answer:   Yes    Order Specific Question:   Preferred imaging location?    Answer:   [facility]    Order Specific Question:   Radiology Contrast Protocol - do NOT remove file path    Answer:   \\[email or web address]    Order Specific Question:   Reason for Exam additional comments    Answer:   lung cancer. restaging.  Marland Kitchen CBC with Differential/Platelet    Standing Status:   Future    Standing Expiration Date:   03/29/2018  . Comprehensive metabolic panel    Standing Status:   Future    Standing Expiration Date:   03/29/2018    Mikey Bussing, DNP, AGPCNP-BC, AOCNP 03/29/17

## 2017-04-05 ENCOUNTER — Ambulatory Visit
Admission: RE | Admit: 2017-04-05 | Discharge: 2017-04-05 | Disposition: A | Discharge status: Home / Self Care | Payer: Medicare Other | Source: Ambulatory Visit | Attending: Urology | Admitting: Urology

## 2017-04-05 ENCOUNTER — Encounter: Payer: Self-pay | Admitting: Urology

## 2017-04-05 VITALS — BP 126/55 | HR 82 | Temp 97.7°F | Resp 20 | Ht 66.0 in | Wt 216.6 lb

## 2017-04-05 DIAGNOSIS — Z79899 Other long term (current) drug therapy: Secondary | ICD-10-CM | POA: Diagnosis not present

## 2017-04-05 DIAGNOSIS — Z87442 Personal history of urinary calculi: Secondary | ICD-10-CM | POA: Diagnosis not present

## 2017-04-05 DIAGNOSIS — C787 Secondary malignant neoplasm of liver and intrahepatic bile duct: Secondary | ICD-10-CM

## 2017-04-05 DIAGNOSIS — Z880 Allergy status to penicillin: Secondary | ICD-10-CM | POA: Insufficient documentation

## 2017-04-05 DIAGNOSIS — Z90710 Acquired absence of both cervix and uterus: Secondary | ICD-10-CM | POA: Diagnosis not present

## 2017-04-05 DIAGNOSIS — C3490 Malignant neoplasm of unspecified part of unspecified bronchus or lung: Secondary | ICD-10-CM | POA: Diagnosis not present

## 2017-04-05 DIAGNOSIS — Z923 Personal history of irradiation: Secondary | ICD-10-CM | POA: Insufficient documentation

## 2017-04-05 NOTE — Addendum Note (Signed)
Encounter addended by: Malena Edman, RN on: 04/05/2017 11:30 AM  Actions taken: Charge Capture section accepted

## 2017-04-05 NOTE — Progress Notes (Signed)
Radiation Oncology         [phone] ________________________________  Name: Debra Hodges MRN: [id number]  Date: 04/05/2017  DOB: 27-Apr-1951  Post Treatment Note  CC: System, Pcp Not In  Curt Bears, MD  Diagnosis:   66 y.o. female with Stage IV (T1b, N3, M1b) EGFR+ adenocarcinoma of the left upper lung with 2 isolated progressive liver metastases    Interval Since Last Radiation:  4.5 weeks  02/21/2017 - 03/02/2017:  The liver was treated to 50 Gy in 5 fractions of 10 Gy.  Narrative:  The patient returns today for routine follow-up.  She tolerated radiation treatment relatively well.   She reported having fatigue and nausea over the course of her treatment with sinus drainage that may have caused the nausea. She denied any pain and had no skin irritation.  On review of systems, the patient states that she is doing very well overall. The nausea and decreased appetite have completely resolved at this point and she feels that her fatigue is significantly improved. She denies any pain or skin irritation in the treatment area. She reports a healthy appetite and is maintaining her weight. She denies abdominal pain, nausea, vomiting or diarrhea. She has not had recent fever, chills or night sweats. She has continued with her systemic therapy taking Tagrisso 80 mg by mouth daily which she tolerates well. She is scheduled for a follow-up visit with Dr. Julien Nordmann on 05/03/2017 and will have a restaging CT of the chest, abdomen and pelvis on 04/30/2017.  ALLERGIES:  is allergic to penicillins.  Meds: Current Outpatient Medications  Medication Sig Dispense Refill  . acetaminophen (TYLENOL) 325 MG tablet Take 650 mg by mouth every 6 (six) hours as needed. Reported on 06/15/2015    . calcium carbonate (OS-CAL) 600 MG TABS tablet Take 600 mg by mouth daily.     Marland Kitchen CLIMARA 0.1 MG/24HR patch 1 patch once a week.    . levothyroxine (SYNTHROID, LEVOTHROID) 50 MCG tablet Take 50 mcg by mouth daily.     . Multiple Vitamins-Calcium (ONE-A-DAY WOMENS PO) Take 1 tablet by mouth daily.    Marland Kitchen osimertinib mesylate (TAGRISSO) 80 MG tablet Take 1 tablet (80 mg total) by mouth daily. 30 tablet 2  . potassium chloride SA (K-DUR,KLOR-CON) 20 MEQ tablet 1 tablet 2 (two) times daily.    . Sodium Fluoride (CLINPRO 5000) 1.1 % PSTE Place 1 application onto teeth 3 (three) times daily.    Marland Kitchen spironolactone (ALDACTONE) 25 MG tablet 1 tablet daily.    . vitamin C (ASCORBIC ACID) 500 MG tablet Take 500 mg by mouth daily.    Marland Kitchen loperamide (IMODIUM) 2 MG capsule Take by mouth as needed for diarrhea or loose stools. Reported on 11/22/2015    . metroNIDAZOLE (METROCREAM) 0.75 % cream Apply 1 application topically daily. Reported on 11/22/2015     No current facility-administered medications for this encounter.     Physical Findings:  height is '5\' 6"'$  (1.676 m) and weight is 216 lb 9.6 oz (98.2 kg). Her oral temperature is 97.7 F (36.5 C). Her blood pressure is 126/55 (abnormal) and her pulse is 82. Her respiration is 20.  Pain Assessment Pain Score: 0-No pain/10 In general this is a well appearing Caucasian female in no acute distress. She's alert and oriented x4 and appropriate throughout the examination. Cardiopulmonary assessment is negative for acute distress and she exhibits normal effort.   Lab Findings: Lab Results  Component Value Date   WBC 4.3 03/29/2017  HGB 12.6 03/29/2017   HCT 37.2 03/29/2017   MCV 89.2 03/29/2017   PLT 106 (L) 03/29/2017     Radiographic Findings: No results found.  Impression/Plan: 1. 66 y.o. 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 She appears to have recovered well from the effects of radiotherapy. She is scheduled for a restaging CT scan of the chest, abdomen and pelvis on 04/30/2017. We will plan to see her for follow-up and to review these results following her scan and we will  attempt to coordinate this to occur on 05/03/2017 following her visit with Dr. Julien Nordmann. She is in agreement with this plan. She knows to call with any issues or concerns in the interim.       Nicholos Johns, PA-C

## 2017-04-06 ENCOUNTER — Telehealth: Payer: Self-pay | Admitting: *Deleted

## 2017-04-06 NOTE — Telephone Encounter (Signed)
CALLED PATIENT TO INFORM OF FU WITH Debra Hodges [place] ON 05-03-17 @ 9 AM, LVM FOR A RETURN CALL

## 2017-04-30 ENCOUNTER — Other Ambulatory Visit (HOSPITAL_BASED_OUTPATIENT_CLINIC_OR_DEPARTMENT_OTHER): Payer: Medicare Other

## 2017-04-30 ENCOUNTER — Encounter (HOSPITAL_COMMUNITY): Payer: Self-pay

## 2017-04-30 ENCOUNTER — Ambulatory Visit (HOSPITAL_COMMUNITY)
Admission: RE | Admit: 2017-04-30 | Discharge: 2017-04-30 | Disposition: A | Discharge status: Home / Self Care | Payer: Medicare Other | Source: Ambulatory Visit | Attending: Oncology | Admitting: Oncology

## 2017-04-30 DIAGNOSIS — C771 Secondary and unspecified malignant neoplasm of intrathoracic lymph nodes: Secondary | ICD-10-CM

## 2017-04-30 DIAGNOSIS — R911 Solitary pulmonary nodule: Secondary | ICD-10-CM | POA: Diagnosis not present

## 2017-04-30 DIAGNOSIS — C3492 Malignant neoplasm of unspecified part of left bronchus or lung: Secondary | ICD-10-CM

## 2017-04-30 DIAGNOSIS — C787 Secondary malignant neoplasm of liver and intrahepatic bile duct: Secondary | ICD-10-CM | POA: Diagnosis not present

## 2017-04-30 DIAGNOSIS — D739 Disease of spleen, unspecified: Secondary | ICD-10-CM | POA: Insufficient documentation

## 2017-04-30 DIAGNOSIS — I700 Atherosclerosis of aorta: Secondary | ICD-10-CM | POA: Diagnosis not present

## 2017-04-30 DIAGNOSIS — C3412 Malignant neoplasm of upper lobe, left bronchus or lung: Secondary | ICD-10-CM

## 2017-04-30 DIAGNOSIS — N200 Calculus of kidney: Secondary | ICD-10-CM | POA: Diagnosis not present

## 2017-04-30 LAB — CBC WITH DIFFERENTIAL/PLATELET
BASO%: 0.1 % (ref 0.0–2.0)
Basophils Absolute: 0 10*3/uL (ref 0.0–0.1)
EOS%: 1.4 % (ref 0.0–7.0)
Eosinophils Absolute: 0.1 10*3/uL (ref 0.0–0.5)
HEMATOCRIT: 38.4 % (ref 34.8–46.6)
HGB: 12.7 g/dL (ref 11.6–15.9)
LYMPH#: 1 10*3/uL (ref 0.9–3.3)
LYMPH%: 24.9 % (ref 14.0–49.7)
MCH: 29.6 pg (ref 25.1–34.0)
MCHC: 32.9 g/dL (ref 31.5–36.0)
MCV: 89.9 fL (ref 79.5–101.0)
MONO#: 0.6 10*3/uL (ref 0.1–0.9)
MONO%: 13.6 % (ref 0.0–14.0)
NEUT#: 2.5 10*3/uL (ref 1.5–6.5)
NEUT%: 60 % (ref 38.4–76.8)
PLATELETS: 123 10*3/uL — AB (ref 145–400)
RBC: 4.28 10*6/uL (ref 3.70–5.45)
RDW: 14.7 % — ABNORMAL HIGH (ref 11.2–14.5)
WBC: 4.2 10*3/uL (ref 3.9–10.3)

## 2017-04-30 LAB — COMPREHENSIVE METABOLIC PANEL
ALBUMIN: 3.8 g/dL (ref 3.5–5.0)
ALK PHOS: 105 U/L (ref 40–150)
ALT: 27 U/L (ref 0–55)
AST: 40 U/L — ABNORMAL HIGH (ref 5–34)
Anion Gap: 8 mEq/L (ref 3–11)
BUN: 14.1 mg/dL (ref 7.0–26.0)
CO2: 26 meq/L (ref 22–29)
Calcium: 9.6 mg/dL (ref 8.4–10.4)
Chloride: 107 mEq/L (ref 98–109)
Creatinine: 0.8 mg/dL (ref 0.6–1.1)
GLUCOSE: 84 mg/dL (ref 70–140)
POTASSIUM: 4.1 meq/L (ref 3.5–5.1)
SODIUM: 141 meq/L (ref 136–145)
Total Bilirubin: 0.55 mg/dL (ref 0.20–1.20)
Total Protein: 8.1 g/dL (ref 6.4–8.3)

## 2017-04-30 MED ORDER — IOPAMIDOL (ISOVUE-300) INJECTION 61%
100.0000 mL | Freq: Once | INTRAVENOUS | Status: AC | PRN
  Administered 2017-04-30: 100 mL via INTRAVENOUS

## 2017-04-30 MED ORDER — IOPAMIDOL (ISOVUE-300) INJECTION 61%
INTRAVENOUS | Status: AC
  Filled 2017-04-30: qty 100

## 2017-04-30 NOTE — Progress Notes (Addendum)
[name] 65 y.o.female with Stage IV (T1b, N3, M1b) EGFR+ adenocarcinoma of the left upper lung with 2 isolated progressive liver metastases completed radiation 03-02-17, review 12-03-18CT chest w contrast, 04-30-17 CT abdomen pelvis w contrast,FU.  Pain:  Denies any pain Nausea/ Vomiting: None Fatigue; Mild fatigue Diarrhea: None Weight:Denies any wt change Denies any issues with shortness of the breath. States that she coughs up clear plegm. Vitals:   05/03/17 0925  BP: 114/71  Pulse: 75  Resp: 20  Temp: 97.7 F (36.5 C)  TempSrc: Oral  SpO2: 100%   Wt Readings from Last 3 Encounters:  05/03/17 216 lb 1.6 oz (98 kg)  04/05/17 216 lb 9.6 oz (98.2 kg)  03/29/17 216 lb 4.8 oz (98.1 kg)         

## 2017-05-03 ENCOUNTER — Telehealth: Payer: Self-pay | Admitting: Internal Medicine

## 2017-05-03 ENCOUNTER — Ambulatory Visit
Admission: RE | Admit: 2017-05-03 | Discharge: 2017-05-03 | Disposition: A | Discharge status: Home / Self Care | Payer: Medicare Other | Source: Ambulatory Visit | Attending: Urology | Admitting: Urology

## 2017-05-03 ENCOUNTER — Encounter: Payer: Self-pay | Admitting: Urology

## 2017-05-03 ENCOUNTER — Ambulatory Visit (HOSPITAL_BASED_OUTPATIENT_CLINIC_OR_DEPARTMENT_OTHER): Payer: Medicare Other | Admitting: Internal Medicine

## 2017-05-03 ENCOUNTER — Other Ambulatory Visit: Payer: Self-pay

## 2017-05-03 ENCOUNTER — Encounter: Payer: Self-pay | Admitting: *Deleted

## 2017-05-03 ENCOUNTER — Encounter: Payer: Self-pay | Admitting: Internal Medicine

## 2017-05-03 VITALS — BP 122/71 | HR 79 | Temp 97.8°F | Resp 18 | Ht 66.0 in | Wt 216.1 lb

## 2017-05-03 VITALS — BP 114/71 | HR 75 | Temp 97.7°F | Resp 20

## 2017-05-03 DIAGNOSIS — Z90710 Acquired absence of both cervix and uterus: Secondary | ICD-10-CM | POA: Diagnosis not present

## 2017-05-03 DIAGNOSIS — C3412 Malignant neoplasm of upper lobe, left bronchus or lung: Secondary | ICD-10-CM

## 2017-05-03 DIAGNOSIS — C787 Secondary malignant neoplasm of liver and intrahepatic bile duct: Secondary | ICD-10-CM

## 2017-05-03 DIAGNOSIS — Z880 Allergy status to penicillin: Secondary | ICD-10-CM | POA: Diagnosis not present

## 2017-05-03 DIAGNOSIS — Z5111 Encounter for antineoplastic chemotherapy: Secondary | ICD-10-CM

## 2017-05-03 DIAGNOSIS — C778 Secondary and unspecified malignant neoplasm of lymph nodes of multiple regions: Secondary | ICD-10-CM

## 2017-05-03 DIAGNOSIS — C3490 Malignant neoplasm of unspecified part of unspecified bronchus or lung: Secondary | ICD-10-CM | POA: Diagnosis not present

## 2017-05-03 DIAGNOSIS — C3492 Malignant neoplasm of unspecified part of left bronchus or lung: Secondary | ICD-10-CM

## 2017-05-03 DIAGNOSIS — Z923 Personal history of irradiation: Secondary | ICD-10-CM | POA: Diagnosis not present

## 2017-05-03 DIAGNOSIS — K769 Liver disease, unspecified: Secondary | ICD-10-CM

## 2017-05-03 DIAGNOSIS — Z79899 Other long term (current) drug therapy: Secondary | ICD-10-CM | POA: Diagnosis not present

## 2017-05-03 NOTE — Telephone Encounter (Signed)
Scheduled appt per 12/6 los - Gave patient AVS and calender per los. - lab and f./u in one month

## 2017-05-03 NOTE — Progress Notes (Signed)
Radiation Oncology         [phone] ________________________________  Name: Debra Hodges MRN: [id number]  Date: 05/03/2017  DOB: 1950/10/12  Post Treatment Note  CC: System, Pcp Not In  Curt Bears, MD  Diagnosis:   66 y.o. 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  Narrative:  The patient returns today for routine follow-up and to review results from recent post-treatment imaging performed 04/30/17.  On review of systems, the patient states that she is doing very well overall. The nausea and decreased appetite have completely resolved at this point and she feels that her fatigue is significantly improved. She denies any pain or skin irritation in the treatment area. She reports a healthy appetite and is maintaining her weight. She denies abdominal pain, nausea, vomiting or diarrhea. She has not had recent fever, chills or night sweats. She has continued with her systemic therapy taking Tagrisso 80 mg by mouth daily which she tolerates well. She had a recent follow-up visit with Dr. Julien Nordmann this morning following her restaging CT of the chest, abdomen and pelvis obtained on 04/30/2017.  This scan shows an excellent response to radiotherapy with reduction in size of the hepatic metastatic lesions. However, there is slight increased size of the left upper lobe pulmonary nodule, measuring 1.5 x 1.1 cm as compared to previously measuring 0.9 x 0.9 cm on imaging from 01/19/17 and potential mild enlargement of the lingular nodule/process.  Dr. Julien Nordmann has recommended that she continue with her current regimen of Tagrisso.  ALLERGIES:  is allergic to penicillins.  Meds: Current Outpatient Medications  Medication Sig Dispense Refill  . acetaminophen (TYLENOL) 325 MG tablet Take 650  mg by mouth every 6 (six) hours as needed. Reported on 06/15/2015    . calcium carbonate (OS-CAL) 600 MG TABS tablet Take 600 mg by mouth daily.     Marland Kitchen CLIMARA 0.1 MG/24HR patch 1 patch once a week.    . levothyroxine (SYNTHROID, LEVOTHROID) 50 MCG tablet Take 50 mcg by mouth daily.    Marland Kitchen loperamide (IMODIUM) 2 MG capsule Take by mouth as needed for diarrhea or loose stools. Reported on 11/22/2015    . metroNIDAZOLE (METROCREAM) 0.75 % cream Apply 1 application topically daily. Reported on 11/22/2015    . Multiple Vitamins-Calcium (ONE-A-DAY WOMENS PO) Take 1 tablet by mouth daily.    Marland Kitchen osimertinib mesylate (TAGRISSO) 80 MG tablet Take 1 tablet (80 mg total) by mouth daily. 30 tablet 2  . potassium chloride SA (K-DUR,KLOR-CON) 20 MEQ tablet 1 tablet 2 (two) times daily.    . Sodium Fluoride (CLINPRO 5000) 1.1 % PSTE Place 1 application onto teeth 3 (three) times daily.    Marland Kitchen spironolactone (ALDACTONE) 25 MG tablet 1 tablet daily.    . vitamin C (ASCORBIC ACID) 500 MG tablet Take 500 mg by mouth daily.     No current facility-administered medications for this encounter.     Physical Findings:  oral temperature is 97.7 F (36.5 C). Her blood pressure is 114/71 and her pulse is 75. Her respiration is 20 and oxygen saturation is 100%.  Pain Assessment Pain Score: 0-No pain/10 In general this is a well appearing Caucasian female in no acute distress. She's alert and oriented x4 and appropriate throughout the examination. Cardiopulmonary  assessment is negative for acute distress and she exhibits normal effort.   Lab Findings: Lab Results  Component Value Date   WBC 4.2 04/30/2017   HGB 12.7 04/30/2017   HCT 38.4 04/30/2017   MCV 89.9 04/30/2017   PLT 123 (L) 04/30/2017     Radiographic Findings: Ct Chest W Contrast  Result Date: 05/01/2017 CLINICAL DATA:  Metastatic non-small cell left lung cancer diagnosed in May of 2015, restaging. Right upper quadrant abdominal pain. Hepatic metastatic  disease with fiducial markers in the liver. EXAM: CT CHEST, ABDOMEN, AND PELVIS WITH CONTRAST TECHNIQUE: Multidetector CT imaging of the chest, abdomen and pelvis was performed following the standard protocol during bolus administration of intravenous contrast. CONTRAST:  164m ISOVUE-300 IOPAMIDOL (ISOVUE-300) INJECTION 61% COMPARISON:  01/19/2017 FINDINGS: CT CHEST FINDINGS Cardiovascular: Atherosclerotic calcification of the aortic arch. Mediastinum/Nodes: A lymph node below the left pulmonary vein measures 0.6 cm in short axis on image 28/2, previously the same. Upper normal sized right hilar lymph node. Small bilateral axillary and subpectoral lymph nodes. Lungs/Pleura: A left upper lobe pulmonary nodule measures 1.5 by 1.1 cm on image 41/4, previously 0.9 by 0.9 cm on 01/19/2017. 0.5 by 0.3 cm right upper lobe pulmonary nodule on image 41/4, stable by my measurements. Ground-glass density nodule in the left upper lobe, 0.9 by 0.6 cm on image 46/4, stable. Volume loss with potential underlying mass lesion in the lingula with truncation of lingular bronchi in this vicinity, thickness of the process measured at 2.1 cm and previously 1.9 cm, with associated downstream volume loss/ atelectasis. Musculoskeletal: Thoracic spondylosis. CT ABDOMEN PELVIS FINDINGS Hepatobiliary: Fiducial noted in segment 4 of the liver and also in the lateral segment left hepatic lobe. Just above the level of the fiducial there is a very indistinctly marginated hypodense lesion approximately 3.0 by 1.7 cm, formerly 3.9 by 2.8 cm. I am might be overestimated in the current lesion size due to the indistinct margins. The hypodense lesion in the lateral segment left hepatic lobe in the area of the fiducial measures proximally 1.8 by 1.4 cm, formerly 2.5 by 2.2 cm. I do not see any new liver lesions. Contracted gallbladder. Pancreas: Unremarkable Spleen: Stable hypodense 0.9 by 0.7 cm lesion in the lower spleen, image 52/2. Adrenals/Urinary  Tract: 4 mm in long axis right kidney upper pole nonobstructive renal calculus. 6 mm in long axis left kidney lower pole calculus with an adjacent 3 mm nonobstructive calculus. 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 Stomach/Bowel: Unremarkable Vascular/Lymphatic: Aortoiliac atherosclerotic vascular disease. Reproductive: Hysterectomy. Stable mild asymmetric prominence the right side of the vaginal cuff possibly due to adjacent ovarian tissue but technically nonspecific. Other: No supplemental non-categorized findings. Musculoskeletal: Grade 1 degenerative anterolisthesis at L4-5. IMPRESSION: 1. Mixed appearance with regard to progression, with enlarging left upper lobe pulmonary nodule and potential mild enlargement of the lingular nodule/process, but also with reduction in size of the hepatic metastatic lesions. 2. Bilateral nonobstructive renal calculi. 3. Other imaging findings of potential clinical significance: Aortic Atherosclerosis (ICD10-I70.0). Stable small splenic hypodense lesion merit surveillance. Stable mild asymmetric prominence of the right vaginal cuff likewise merits surveillance. Electronically Signed   By: WVan ClinesM.D.   On: 05/01/2017 10:51   Ct Abdomen Pelvis W Contrast  Result Date: 05/01/2017 CLINICAL DATA:  Metastatic non-small cell left lung cancer diagnosed in May of 2015, restaging. Right upper quadrant abdominal pain. Hepatic metastatic disease with fiducial markers in the liver. EXAM: CT CHEST, ABDOMEN, AND PELVIS WITH CONTRAST TECHNIQUE: Multidetector CT  imaging of the chest, abdomen and pelvis was performed following the standard protocol during bolus administration of intravenous contrast. CONTRAST:  129m ISOVUE-300 IOPAMIDOL (ISOVUE-300) INJECTION 61% COMPARISON:  01/19/2017 FINDINGS: CT CHEST FINDINGS Cardiovascular: Atherosclerotic calcification of the aortic arch. Mediastinum/Nodes: A lymph node below the left pulmonary vein measures 0.6 cm in short axis on  image 28/2, previously the same. Upper normal sized right hilar lymph node. Small bilateral axillary and subpectoral lymph nodes. Lungs/Pleura: A left upper lobe pulmonary nodule measures 1.5 by 1.1 cm on image 41/4, previously 0.9 by 0.9 cm on 01/19/2017. 0.5 by 0.3 cm right upper lobe pulmonary nodule on image 41/4, stable by my measurements. Ground-glass density nodule in the left upper lobe, 0.9 by 0.6 cm on image 46/4, stable. Volume loss with potential underlying mass lesion in the lingula with truncation of lingular bronchi in this vicinity, thickness of the process measured at 2.1 cm and previously 1.9 cm, with associated downstream volume loss/ atelectasis. Musculoskeletal: Thoracic spondylosis. CT ABDOMEN PELVIS FINDINGS Hepatobiliary: Fiducial noted in segment 4 of the liver and also in the lateral segment left hepatic lobe. Just above the level of the fiducial there is a very indistinctly marginated hypodense lesion approximately 3.0 by 1.7 cm, formerly 3.9 by 2.8 cm. I am might be overestimated in the current lesion size due to the indistinct margins. The hypodense lesion in the lateral segment left hepatic lobe in the area of the fiducial measures proximally 1.8 by 1.4 cm, formerly 2.5 by 2.2 cm. I do not see any new liver lesions. Contracted gallbladder. Pancreas: Unremarkable Spleen: Stable hypodense 0.9 by 0.7 cm lesion in the lower spleen, image 52/2. Adrenals/Urinary Tract: 4 mm in long axis right kidney upper pole nonobstructive renal calculus. 6 mm in long axis left kidney lower pole calculus with an adjacent 3 mm nonobstructive calculus. 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 Stomach/Bowel: Unremarkable Vascular/Lymphatic: Aortoiliac atherosclerotic vascular disease. Reproductive: Hysterectomy. Stable mild asymmetric prominence the right side of the vaginal cuff possibly due to adjacent ovarian tissue but technically nonspecific. Other: No supplemental non-categorized findings.  Musculoskeletal: Grade 1 degenerative anterolisthesis at L4-5. IMPRESSION: 1. Mixed appearance with regard to progression, with enlarging left upper lobe pulmonary nodule and potential mild enlargement of the lingular nodule/process, but also with reduction in size of the hepatic metastatic lesions. 2. Bilateral nonobstructive renal calculi. 3. Other imaging findings of potential clinical significance: Aortic Atherosclerosis (ICD10-I70.0). Stable small splenic hypodense lesion merit surveillance. Stable mild asymmetric prominence of the right vaginal cuff likewise merits surveillance. Electronically Signed   By: WVan ClinesM.D.   On: 05/01/2017 10:51    Impression/Plan: 1. 66y.o. 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 She appears to have recovered well from the effects of radiotherapy. Recent restaging CT scan of the chest, abdomen and pelvis on 04/30/2017 shows and excellent response to her recent radiotherapy to the 2 liver metastases. I advised that while we are happy to continue to participate in her care if clinically indicated, at this point, we will plan to see her back on an as needed basis.   She will continue her routine disease surveillance and management under the care and direction of Dr. MJulien Nordmann with her next visit scheduled 06/07/17. She is comfortable with and is in agreement with this plan. She knows to call at any time with any issues or concerns related to her previous radiotherapy.  Nicholos Johns, PA-C

## 2017-05-03 NOTE — Progress Notes (Signed)
[place] [phone]   [phone]  OFFICE PROGRESS NOTE  System, Pcp Not In No address on file  DIAGNOSIS: Stage IV (T1b, N3, M1b) non-small cell lung cancer, adenocarcinoma with positive EGFR mutation in exon 21 (L858R) and recently found to have T790M resistant mutation, presented with left upper lobe lung mass in addition to left hilar and bilateral mediastinal lymphadenopathy as well as liver metastasis diagnosed in May of 2015.  PRIOR THERAPY: 1) Tarceva 150 mg by mouth daily. Started 11/25/2013. Status post 5 months of treatment discontinued today secondary to intolerance with persistent paronychia. 2) Tarceva 100 mg by mouth daily. Started 06/03/2014. Status post 8 months of treatment.  3) stereotactic radiotherapy to liver lesions under the care of Dr. Tammi Klippel. Last dose 03/02/2017  CURRENT THERAPY: Tagrisso 80 mg by mouth daily status post 25 months of treatment. First dose was given 03/04/2015.  INTERVAL HISTORY: Debra Hodges 66 y.o. female returns to the clinic today for follow-up visit accompanied by her husband.  The patient is feeling fine today with no specific complaints.  She denied having any current chest pain, shortness breath, cough or hemoptysis.  She denied having any fever or chills.  She has no nausea, vomiting, diarrhea or constipation.  She has no abdominal pain.  She denied having any weight loss or night sweats.  She is currently on treatment with Tagrisso and tolerating her treatment fairly well.  The patient had a repeat CT scan of the chest, abdomen and pelvis performed recently and she is here for evaluation and discussion of her risk her results.  MEDICAL HISTORY: Past Medical History:  Diagnosis Date  . High blood pressure   . Liver lesion 06/26/2016  . Non-small cell carcinoma of lung, stage 4 ([place]) dx'd 10/2013    ALLERGIES:  is allergic to penicillins.  MEDICATIONS:  Current Outpatient Medications    Medication Sig Dispense Refill  . acetaminophen (TYLENOL) 325 MG tablet Take 650 mg by mouth every 6 (six) hours as needed. Reported on 06/15/2015    . calcium carbonate (OS-CAL) 600 MG TABS tablet Take 600 mg by mouth daily.     Marland Kitchen CLIMARA 0.1 MG/24HR patch 1 patch once a week.    . levothyroxine (SYNTHROID, LEVOTHROID) 50 MCG tablet Take 50 mcg by mouth daily.    Marland Kitchen loperamide (IMODIUM) 2 MG capsule Take by mouth as needed for diarrhea or loose stools. Reported on 11/22/2015    . metroNIDAZOLE (METROCREAM) 0.75 % cream Apply 1 application topically daily. Reported on 11/22/2015    . Multiple Vitamins-Calcium (ONE-A-DAY WOMENS PO) Take 1 tablet by mouth daily.    Marland Kitchen osimertinib mesylate (TAGRISSO) 80 MG tablet Take 1 tablet (80 mg total) by mouth daily. 30 tablet 2  . potassium chloride SA (K-DUR,KLOR-CON) 20 MEQ tablet 1 tablet 2 (two) times daily.    . Sodium Fluoride (CLINPRO 5000) 1.1 % PSTE Place 1 application onto teeth 3 (three) times daily.    Marland Kitchen spironolactone (ALDACTONE) 25 MG tablet 1 tablet daily.    . vitamin C (ASCORBIC ACID) 500 MG tablet Take 500 mg by mouth daily.     No current facility-administered medications for this visit.     SURGICAL HISTORY:  Past Surgical History:  Procedure Laterality Date  . ABDOMINAL HYSTERECTOMY    . BREAST BIOPSY     Right breast  . VIDEO BRONCHOSCOPY Bilateral 10/27/2013   Procedure: VIDEO BRONCHOSCOPY WITH FLUORO;  Surgeon: Collene Gobble, MD;  Location: [facility];  Service: Cardiopulmonary;  Laterality: Bilateral;    REVIEW OF SYSTEMS:  Constitutional: negative Eyes: negative Ears, nose, mouth, throat, and face: negative Respiratory: negative Cardiovascular: negative Gastrointestinal: negative Genitourinary:negative Integument/breast: negative Hematologic/lymphatic: negative Musculoskeletal:negative Neurological: negative Behavioral/Psych: negative Endocrine: negative Allergic/Immunologic: negative   PHYSICAL EXAMINATION:  General appearance: alert, cooperative and no distress Head: Normocephalic, without obvious abnormality, atraumatic Neck: no adenopathy, no JVD, supple, symmetrical, trachea midline and thyroid not enlarged, symmetric, no tenderness/mass/nodules Lymph nodes: Cervical, supraclavicular, and axillary nodes normal. Resp: clear to auscultation bilaterally Back: symmetric, no curvature. ROM normal. No CVA tenderness. Cardio: regular rate and rhythm, S1, S2 normal, no murmur, click, rub or gallop GI: soft, non-tender; bowel sounds normal; no masses,  no organomegaly Extremities: extremities normal, atraumatic, no cyanosis or edema Neurologic: Alert and oriented X 3, normal strength and tone. Normal symmetric reflexes. Normal coordination and gait  ECOG PERFORMANCE STATUS: 0 - Asymptomatic  Blood pressure 122/71, pulse 79, temperature 97.8 F (36.6 C), temperature source Oral, resp. rate 18, height '5\' 6"'  (1.676 m), weight 216 lb 1.6 oz (98 kg), SpO2 100 %.  LABORATORY DATA: Lab Results  Component Value Date   WBC 4.2 04/30/2017   HGB 12.7 04/30/2017   HCT 38.4 04/30/2017   MCV 89.9 04/30/2017   PLT 123 (L) 04/30/2017      Chemistry      Component Value Date/Time   NA 141 04/30/2017 1541   K 4.1 04/30/2017 1541   CO2 26 04/30/2017 1541   BUN 14.1 04/30/2017 1541   CREATININE 0.8 04/30/2017 1541      Component Value Date/Time   CALCIUM 9.6 04/30/2017 1541   ALKPHOS 105 04/30/2017 1541   AST 40 (H) 04/30/2017 1541   ALT 27 04/30/2017 1541   BILITOT 0.55 04/30/2017 1541       RADIOGRAPHIC STUDIES: Ct Chest W Contrast  Result Date: 05/01/2017 CLINICAL DATA:  Metastatic non-small cell left lung cancer diagnosed in May of 2015, restaging. Right upper quadrant abdominal pain. Hepatic metastatic disease with fiducial markers in the liver. EXAM: CT CHEST, ABDOMEN, AND PELVIS WITH CONTRAST TECHNIQUE: Multidetector CT imaging of the chest, abdomen and pelvis was performed following the  standard protocol during bolus administration of intravenous contrast. CONTRAST:  117m ISOVUE-300 IOPAMIDOL (ISOVUE-300) INJECTION 61% COMPARISON:  01/19/2017 FINDINGS: CT CHEST FINDINGS Cardiovascular: Atherosclerotic calcification of the aortic arch. Mediastinum/Nodes: A lymph node below the left pulmonary vein measures 0.6 cm in short axis on image 28/2, previously the same. Upper normal sized right hilar lymph node. Small bilateral axillary and subpectoral lymph nodes. Lungs/Pleura: A left upper lobe pulmonary nodule measures 1.5 by 1.1 cm on image 41/4, previously 0.9 by 0.9 cm on 01/19/2017. 0.5 by 0.3 cm right upper lobe pulmonary nodule on image 41/4, stable by my measurements. Ground-glass density nodule in the left upper lobe, 0.9 by 0.6 cm on image 46/4, stable. Volume loss with potential underlying mass lesion in the lingula with truncation of lingular bronchi in this vicinity, thickness of the process measured at 2.1 cm and previously 1.9 cm, with associated downstream volume loss/ atelectasis. Musculoskeletal: Thoracic spondylosis. CT ABDOMEN PELVIS FINDINGS Hepatobiliary: Fiducial noted in segment 4 of the liver and also in the lateral segment left hepatic lobe. Just above the level of the fiducial there is a very indistinctly marginated hypodense lesion approximately 3.0 by 1.7 cm, formerly 3.9 by 2.8 cm. I am might be overestimated in the current lesion size due to the indistinct margins. The hypodense  lesion in the lateral segment left hepatic lobe in the area of the fiducial measures proximally 1.8 by 1.4 cm, formerly 2.5 by 2.2 cm. I do not see any new liver lesions. Contracted gallbladder. Pancreas: Unremarkable Spleen: Stable hypodense 0.9 by 0.7 cm lesion in the lower spleen, image 52/2. Adrenals/Urinary Tract: 4 mm in long axis right kidney upper pole nonobstructive renal calculus. 6 mm in long axis left kidney lower pole calculus with an adjacent 3 mm nonobstructive calculus. 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 Stomach/Bowel: Unremarkable Vascular/Lymphatic: Aortoiliac atherosclerotic vascular disease. Reproductive: Hysterectomy. Stable mild asymmetric prominence the right side of the vaginal cuff possibly due to adjacent ovarian tissue but technically nonspecific. Other: No supplemental non-categorized findings. Musculoskeletal: Grade 1 degenerative anterolisthesis at L4-5. IMPRESSION: 1. Mixed appearance with regard to progression, with enlarging left upper lobe pulmonary nodule and potential mild enlargement of the lingular nodule/process, but also with reduction in size of the hepatic metastatic lesions. 2. Bilateral nonobstructive renal calculi. 3. Other imaging findings of potential clinical significance: Aortic Atherosclerosis (ICD10-I70.0). Stable small splenic hypodense lesion merit surveillance. Stable mild asymmetric prominence of the right vaginal cuff likewise merits surveillance. Electronically Signed   By: Van Clines M.D.   On: 05/01/2017 10:51   Ct Abdomen Pelvis W Contrast  Result Date: 05/01/2017 CLINICAL DATA:  Metastatic non-small cell left lung cancer diagnosed in May of 2015, restaging. Right upper quadrant abdominal pain. Hepatic metastatic disease with fiducial markers in the liver. EXAM: CT CHEST, ABDOMEN, AND PELVIS WITH CONTRAST TECHNIQUE: Multidetector CT imaging of the chest, abdomen and pelvis was performed following the standard protocol during bolus administration of intravenous contrast. CONTRAST:  149m ISOVUE-300 IOPAMIDOL (ISOVUE-300) INJECTION 61% COMPARISON:  01/19/2017 FINDINGS: CT CHEST FINDINGS Cardiovascular: Atherosclerotic calcification of the aortic arch. Mediastinum/Nodes: A lymph node below the left pulmonary vein measures 0.6 cm in short axis on image 28/2, previously the same. Upper normal sized right hilar lymph node. Small bilateral axillary and subpectoral lymph nodes. Lungs/Pleura: A left upper lobe pulmonary nodule measures  1.5 by 1.1 cm on image 41/4, previously 0.9 by 0.9 cm on 01/19/2017. 0.5 by 0.3 cm right upper lobe pulmonary nodule on image 41/4, stable by my measurements. Ground-glass density nodule in the left upper lobe, 0.9 by 0.6 cm on image 46/4, stable. Volume loss with potential underlying mass lesion in the lingula with truncation of lingular bronchi in this vicinity, thickness of the process measured at 2.1 cm and previously 1.9 cm, with associated downstream volume loss/ atelectasis. Musculoskeletal: Thoracic spondylosis. CT ABDOMEN PELVIS FINDINGS Hepatobiliary: Fiducial noted in segment 4 of the liver and also in the lateral segment left hepatic lobe. Just above the level of the fiducial there is a very indistinctly marginated hypodense lesion approximately 3.0 by 1.7 cm, formerly 3.9 by 2.8 cm. I am might be overestimated in the current lesion size due to the indistinct margins. The hypodense lesion in the lateral segment left hepatic lobe in the area of the fiducial measures proximally 1.8 by 1.4 cm, formerly 2.5 by 2.2 cm. I do not see any new liver lesions. Contracted gallbladder. Pancreas: Unremarkable Spleen: Stable hypodense 0.9 by 0.7 cm lesion in the lower spleen, image 52/2. Adrenals/Urinary Tract: 4 mm in long axis right kidney upper pole nonobstructive renal calculus. 6 mm in long axis left kidney lower pole calculus with an adjacent 3 mm nonobstructive calculus. 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 Stomach/Bowel: Unremarkable Vascular/Lymphatic: Aortoiliac atherosclerotic vascular disease. Reproductive: Hysterectomy. Stable mild asymmetric  prominence the right side of the vaginal cuff possibly due to adjacent ovarian tissue but technically nonspecific. Other: No supplemental non-categorized findings. Musculoskeletal: Grade 1 degenerative anterolisthesis at L4-5. IMPRESSION: 1. Mixed appearance with regard to progression, with enlarging left upper lobe pulmonary nodule and potential mild  enlargement of the lingular nodule/process, but also with reduction in size of the hepatic metastatic lesions. 2. Bilateral nonobstructive renal calculi. 3. Other imaging findings of potential clinical significance: Aortic Atherosclerosis (ICD10-I70.0). Stable small splenic hypodense lesion merit surveillance. Stable mild asymmetric prominence of the right vaginal cuff likewise merits surveillance. Electronically Signed   By: Van Clines M.D.   On: 05/01/2017 10:51    ASSESSMENT AND PLAN:  This is a very pleasant 66 years old white female with a stage IV non-small cell lung cancer, adenocarcinoma with positive EGFR mutation in exon 21 (L858R) diagnosed in May 2015 status post treatment with Tarceva for 13 months discontinued secondary to disease progression and development of T790M resistant mutation. The patient is currently undergoing treatment with Tagrisso 80 mg by mouth daily status post 25 months. She continues to tolerate this treatment fairly well with no significant adverse effects. The patient had a repeat CT scan of the chest, abdomen and pelvis performed recently.  I personally and independently reviewed the scan images.  The scan showed mixed response with some improvement in the liver disease but a slight increase in the left upper lobe pulmonary nodule. I recommended for the patient to continue her current treatment with Tagrisso with the same dose. I will see her back for follow-up visit in 1 month for evaluation with repeat blood work. The patient was advised to call immediately if she has any concerning symptoms in the interval. The patient voices understanding of current disease status and treatment options and is in agreement with the current care plan. All questions were answered. The patient knows to call the clinic with any problems, questions or concerns. We can certainly see the patient much sooner if necessary.  Disclaimer: This note was dictated with voice recognition  software. Similar sounding words can inadvertently be transcribed and may not be corrected upon review.

## 2017-05-10 ENCOUNTER — Other Ambulatory Visit: Payer: Self-pay | Admitting: *Deleted

## 2017-05-10 DIAGNOSIS — Z006 Encounter for examination for normal comparison and control in clinical research program: Secondary | ICD-10-CM

## 2017-05-15 ENCOUNTER — Telehealth: Payer: Self-pay | Admitting: Pharmacy Technician

## 2017-05-15 NOTE — Telephone Encounter (Signed)
Oral Oncology Patient Advocate Encounter  Completed an application for [place] and [place] Patient Assistance Program in an effort to keep the patient's out of pocket expense for Tagrisso at $0.    Application completed and faxed to [phone].   [employer] patient assistance phone number for follow up is [phone].   This encounter will be updated until final determination.  Fabio Asa. Melynda Keller, [place] Patient [place] [phone] 05/15/2017 4:10 PM

## 2017-06-07 ENCOUNTER — Telehealth: Payer: Self-pay | Admitting: Internal Medicine

## 2017-06-07 ENCOUNTER — Inpatient Hospital Stay: Payer: Medicare Other | Attending: Internal Medicine | Admitting: Internal Medicine

## 2017-06-07 ENCOUNTER — Inpatient Hospital Stay: Payer: Medicare Other

## 2017-06-07 ENCOUNTER — Encounter: Payer: Self-pay | Admitting: Internal Medicine

## 2017-06-07 VITALS — BP 131/62 | HR 81 | Temp 97.7°F | Resp 18 | Ht 66.0 in | Wt 215.2 lb

## 2017-06-07 DIAGNOSIS — I10 Essential (primary) hypertension: Secondary | ICD-10-CM | POA: Insufficient documentation

## 2017-06-07 DIAGNOSIS — Z006 Encounter for examination for normal comparison and control in clinical research program: Secondary | ICD-10-CM

## 2017-06-07 DIAGNOSIS — C3412 Malignant neoplasm of upper lobe, left bronchus or lung: Secondary | ICD-10-CM

## 2017-06-07 DIAGNOSIS — Z90710 Acquired absence of both cervix and uterus: Secondary | ICD-10-CM | POA: Insufficient documentation

## 2017-06-07 DIAGNOSIS — D696 Thrombocytopenia, unspecified: Secondary | ICD-10-CM | POA: Insufficient documentation

## 2017-06-07 DIAGNOSIS — C787 Secondary malignant neoplasm of liver and intrahepatic bile duct: Secondary | ICD-10-CM | POA: Diagnosis not present

## 2017-06-07 DIAGNOSIS — C3492 Malignant neoplasm of unspecified part of left bronchus or lung: Secondary | ICD-10-CM

## 2017-06-07 DIAGNOSIS — Z79899 Other long term (current) drug therapy: Secondary | ICD-10-CM | POA: Insufficient documentation

## 2017-06-07 DIAGNOSIS — Z5111 Encounter for antineoplastic chemotherapy: Secondary | ICD-10-CM

## 2017-06-07 LAB — CBC WITH DIFFERENTIAL/PLATELET
Basophils Absolute: 0 10*3/uL (ref 0.0–0.1)
Basophils Relative: 0 %
Eosinophils Absolute: 0.1 10*3/uL (ref 0.0–0.5)
Eosinophils Relative: 3 %
HCT: 37.3 % (ref 34.8–46.6)
Hemoglobin: 12.5 g/dL (ref 11.6–15.9)
Lymphocytes Relative: 21 %
Lymphs Abs: 1.1 10*3/uL (ref 0.9–3.3)
MCH: 30.3 pg (ref 25.1–34.0)
MCHC: 33.5 g/dL (ref 31.5–36.0)
MCV: 90.5 fL (ref 79.5–101.0)
Monocytes Absolute: 0.7 10*3/uL (ref 0.1–0.9)
Monocytes Relative: 14 %
Neutro Abs: 3.3 10*3/uL (ref 1.5–6.5)
Neutrophils Relative %: 62 %
Platelets: 127 10*3/uL — ABNORMAL LOW (ref 145–400)
RBC: 4.12 MIL/uL (ref 3.70–5.45)
RDW: 14.1 % (ref 11.2–16.1)
WBC: 5.3 10*3/uL (ref 3.9–10.3)

## 2017-06-07 LAB — COMPREHENSIVE METABOLIC PANEL WITH GFR
ALT: 19 U/L (ref 0–55)
AST: 28 U/L (ref 5–34)
Albumin: 3.6 g/dL (ref 3.5–5.0)
Alkaline Phosphatase: 100 U/L (ref 40–150)
Anion gap: 8 (ref 3–11)
BUN: 14 mg/dL (ref 7–26)
CO2: 25 mmol/L (ref 22–29)
Calcium: 9.1 mg/dL (ref 8.4–10.4)
Chloride: 107 mmol/L (ref 98–109)
Creatinine, Ser: 0.86 mg/dL (ref 0.60–1.10)
GFR calc Af Amer: >60 mL/min
GFR calc non Af Amer: >60 mL/min
Glucose, Bld: 94 mg/dL (ref 70–140)
Potassium: 4 mmol/L (ref 3.3–4.7)
Sodium: 140 mmol/L (ref 136–145)
Total Bilirubin: 0.6 mg/dL (ref 0.2–1.2)
Total Protein: 7.5 g/dL (ref 6.4–8.3)

## 2017-06-07 MED ORDER — OSIMERTINIB MESYLATE 80 MG PO TABS: 80.0000 mg | ORAL_TABLET | Freq: Every day | ORAL | 2 refills | Status: DC

## 2017-06-07 NOTE — Progress Notes (Signed)
[place] [phone]) [phone]   Fax:(336) [phone]  OFFICE PROGRESS NOTE  System, Pcp Not In No address on file  DIAGNOSIS: Stage IV (T1b, N3, M1b) non-small cell lung cancer, adenocarcinoma with positive EGFR mutation in exon 21 (L858R) and recently found to have T790M resistant mutation, presented with left upper lobe lung mass in addition to left hilar and bilateral mediastinal lymphadenopathy as well as liver metastasis diagnosed in May of 2015.  PRIOR THERAPY: 1) Tarceva 150 mg by mouth daily. Started 11/25/2013. Status post 5 months of treatment discontinued today secondary to intolerance with persistent paronychia. 2) Tarceva 100 mg by mouth daily. Started 06/03/2014. Status post 8 months of treatment.  3) stereotactic radiotherapy to liver lesions under the care of Dr. Tammi Klippel. Last dose 03/02/2017  CURRENT THERAPY: Tagrisso 80 mg by mouth daily status post 26 months of treatment. First dose was given 03/04/2015.  INTERVAL HISTORY: Debra Hodges 67 y.o. female returns to the clinic today for follow-up visit accompanied by her husband.  The patient is feeling fine with no specific complaints.  She denied having any significant skin rash or diarrhea.  She denied having any chest pain, shortness breath, cough or hemoptysis.  She has no fever or chills.  She denied having any recent weight loss or night sweats.  She continues to tolerate her treatment with Tagrisso fairly well.  She is here today for evaluation and repeat blood work.  MEDICAL HISTORY: Past Medical History:  Diagnosis Date  . High blood pressure   . Liver lesion 06/26/2016  . Non-small cell carcinoma of lung, stage 4 ([place]) dx'd 10/2013    ALLERGIES:  is allergic to penicillins.  MEDICATIONS:  Current Outpatient Medications  Medication Sig Dispense Refill  . acetaminophen (TYLENOL) 325 MG tablet Take 650 mg by mouth every 6 (six) hours as needed. Reported on 06/15/2015    . calcium  carbonate (OS-CAL) 600 MG TABS tablet Take 600 mg by mouth daily.     Marland Kitchen CLIMARA 0.1 MG/24HR patch 1 patch once a week.    . levothyroxine (SYNTHROID, LEVOTHROID) 50 MCG tablet Take 50 mcg by mouth daily.    Marland Kitchen loperamide (IMODIUM) 2 MG capsule Take by mouth as needed for diarrhea or loose stools. Reported on 11/22/2015    . metroNIDAZOLE (METROCREAM) 0.75 % cream Apply 1 application topically daily. Reported on 11/22/2015    . Multiple Vitamins-Calcium (ONE-A-DAY WOMENS PO) Take 1 tablet by mouth daily.    Marland Kitchen osimertinib mesylate (TAGRISSO) 80 MG tablet Take 1 tablet (80 mg total) by mouth daily. 30 tablet 2  . potassium chloride SA (K-DUR,KLOR-CON) 20 MEQ tablet 1 tablet 2 (two) times daily.    . Sodium Fluoride (CLINPRO 5000) 1.1 % PSTE Place 1 application onto teeth 3 (three) times daily.    Marland Kitchen spironolactone (ALDACTONE) 25 MG tablet 1 tablet daily.    . vitamin C (ASCORBIC ACID) 500 MG tablet Take 500 mg by mouth daily.     No current facility-administered medications for this visit.     SURGICAL HISTORY:  Past Surgical History:  Procedure Laterality Date  . ABDOMINAL HYSTERECTOMY    . BREAST BIOPSY     Right breast  . VIDEO BRONCHOSCOPY Bilateral 10/27/2013   Procedure: VIDEO BRONCHOSCOPY WITH FLUORO;  Surgeon: Collene Gobble, MD;  Location: [facility];  Service: Cardiopulmonary;  Laterality: Bilateral;    REVIEW OF SYSTEMS:  A comprehensive review of systems was negative.   PHYSICAL EXAMINATION: General  appearance: alert, cooperative and no distress Head: Normocephalic, without obvious abnormality, atraumatic Neck: no adenopathy, no JVD, supple, symmetrical, trachea midline and thyroid not enlarged, symmetric, no tenderness/mass/nodules Lymph nodes: Cervical, supraclavicular, and axillary nodes normal. Resp: clear to auscultation bilaterally Back: symmetric, no curvature. ROM normal. No CVA tenderness. Cardio: regular rate and rhythm, S1, S2 normal, no murmur, click, rub or  gallop GI: soft, non-tender; bowel sounds normal; no masses,  no organomegaly Extremities: extremities normal, atraumatic, no cyanosis or edema  ECOG PERFORMANCE STATUS: 0 - Asymptomatic  Blood pressure 131/62, pulse 81, temperature 97.7 F (36.5 C), temperature source Oral, resp. rate 18, height '5\' 6"'$  (1.676 m), weight 215 lb 3.2 oz (97.6 kg), SpO2 100 %.  LABORATORY DATA: Lab Results  Component Value Date   WBC 4.2 04/30/2017   HGB 12.7 04/30/2017   HCT 38.4 04/30/2017   MCV 89.9 04/30/2017   PLT 123 (L) 04/30/2017      Chemistry      Component Value Date/Time   NA 141 04/30/2017 1541   K 4.1 04/30/2017 1541   CO2 26 04/30/2017 1541   BUN 14.1 04/30/2017 1541   CREATININE 0.8 04/30/2017 1541      Component Value Date/Time   CALCIUM 9.6 04/30/2017 1541   ALKPHOS 105 04/30/2017 1541   AST 40 (H) 04/30/2017 1541   ALT 27 04/30/2017 1541   BILITOT 0.55 04/30/2017 1541       RADIOGRAPHIC STUDIES: No results found.  ASSESSMENT AND PLAN:  This is a very pleasant 67 years old white female with a stage IV non-small cell lung cancer, adenocarcinoma with positive EGFR mutation in exon 21 (L858R) diagnosed in May 2015 status post treatment with Tarceva for 13 months discontinued secondary to disease progression and development of T790M resistant mutation. The patient is currently undergoing treatment with Tagrisso 80 mg by mouth daily status post 26 months. The patient tolerated the last month of her treatment well with no concerning complaints. CBC today is unremarkable except for mild thrombocytopenia.  Complaints of metabolic panel is a still pending. I recommended for the patient to continue her current treatment with Tagrisso.  I would see her back for follow-up visit in 1 month for evaluation with repeat blood work. She was advised to call immediately if she has any concerning symptoms in the interval. The patient voices understanding of current disease status and  treatment options and is in agreement with the current care plan. All questions were answered. The patient knows to call the clinic with any problems, questions or concerns. We can certainly see the patient much sooner if necessary.  Disclaimer: This note was dictated with voice recognition software. Similar sounding words can inadvertently be transcribed and may not be corrected upon review.

## 2017-06-07 NOTE — Telephone Encounter (Signed)
Gave avs and calendar for February.patient needed morning

## 2017-06-08 LAB — RESEARCH LABS

## 2017-06-11 NOTE — Telephone Encounter (Signed)
Oral Oncology Patient Advocate Encounter  Received notification from [facility] and [place] Patient Assistance program that patient has been successfully re-enrolled into their program to continue to receive Tagrisso from the manufacturer at $0 out of pocket until 05/28/2018.   I called and left a message for patient.  Patient knows to call the office with questions or concerns.  Oral Oncology Clinic will continue to follow.  Fabio Asa. Melynda Keller, [place] Patient [place] [phone] 06/11/2017 11:18 AM

## 2017-07-12 ENCOUNTER — Encounter: Payer: Self-pay | Admitting: Internal Medicine

## 2017-07-12 ENCOUNTER — Telehealth: Payer: Self-pay | Admitting: Internal Medicine

## 2017-07-12 ENCOUNTER — Inpatient Hospital Stay: Payer: Medicare Other

## 2017-07-12 ENCOUNTER — Inpatient Hospital Stay: Payer: Medicare Other | Attending: Internal Medicine | Admitting: Internal Medicine

## 2017-07-12 DIAGNOSIS — C787 Secondary malignant neoplasm of liver and intrahepatic bile duct: Secondary | ICD-10-CM | POA: Insufficient documentation

## 2017-07-12 DIAGNOSIS — R1011 Right upper quadrant pain: Secondary | ICD-10-CM

## 2017-07-12 DIAGNOSIS — C7802 Secondary malignant neoplasm of left lung: Secondary | ICD-10-CM

## 2017-07-12 DIAGNOSIS — C3412 Malignant neoplasm of upper lobe, left bronchus or lung: Secondary | ICD-10-CM | POA: Insufficient documentation

## 2017-07-12 DIAGNOSIS — I10 Essential (primary) hypertension: Secondary | ICD-10-CM | POA: Insufficient documentation

## 2017-07-12 DIAGNOSIS — Z79899 Other long term (current) drug therapy: Secondary | ICD-10-CM | POA: Diagnosis not present

## 2017-07-12 DIAGNOSIS — C778 Secondary and unspecified malignant neoplasm of lymph nodes of multiple regions: Secondary | ICD-10-CM

## 2017-07-12 DIAGNOSIS — C3490 Malignant neoplasm of unspecified part of unspecified bronchus or lung: Secondary | ICD-10-CM

## 2017-07-12 LAB — CBC WITH DIFFERENTIAL (CANCER CENTER ONLY)
BASOS ABS: 0 10*3/uL (ref 0.0–0.1)
Basophils Relative: 0 %
EOS ABS: 0.1 10*3/uL (ref 0.0–0.5)
Eosinophils Relative: 2 %
HCT: 35.8 % (ref 34.8–46.6)
HEMOGLOBIN: 12.1 g/dL (ref 11.6–15.9)
LYMPHS ABS: 1.2 10*3/uL (ref 0.9–3.3)
Lymphocytes Relative: 22 %
MCH: 30.8 pg (ref 25.1–34.0)
MCHC: 33.9 g/dL (ref 31.5–36.0)
MCV: 90.7 fL (ref 79.5–101.0)
Monocytes Absolute: 0.6 10*3/uL (ref 0.1–0.9)
Monocytes Relative: 11 %
NEUTROS PCT: 65 %
Neutro Abs: 3.5 10*3/uL (ref 1.5–6.5)
Platelet Count: 144 10*3/uL — ABNORMAL LOW (ref 145–400)
RBC: 3.94 MIL/uL (ref 3.70–5.45)
RDW: 14.2 % (ref 11.2–14.5)
WBC: 5.4 10*3/uL (ref 3.9–10.3)

## 2017-07-12 LAB — CMP (CANCER CENTER ONLY)
ALBUMIN: 3.7 g/dL (ref 3.5–5.0)
ALK PHOS: 108 U/L (ref 40–150)
ALT: 19 U/L (ref 0–55)
AST: 28 U/L (ref 5–34)
Anion gap: 8 (ref 3–11)
BUN: 17 mg/dL (ref 7–26)
CALCIUM: 9.2 mg/dL (ref 8.4–10.4)
CO2: 23 mmol/L (ref 22–29)
Chloride: 108 mmol/L (ref 98–109)
Creatinine: 0.82 mg/dL (ref 0.60–1.10)
GFR, Est AFR Am: >60 mL/min (ref >60)
GFR, Estimated: >60 mL/min (ref >60)
GLUCOSE: 86 mg/dL (ref 70–140)
Potassium: 4.4 mmol/L (ref 3.5–5.1)
SODIUM: 139 mmol/L (ref 136–145)
Total Bilirubin: 0.6 mg/dL (ref 0.2–1.2)
Total Protein: 7.5 g/dL (ref 6.4–8.3)

## 2017-07-12 NOTE — Progress Notes (Signed)
[place] [phone]   [phone]  OFFICE PROGRESS NOTE  System, Pcp Not In No address on file  DIAGNOSIS: Stage IV (T1b, N3, M1b) non-small cell lung cancer, adenocarcinoma with positive EGFR mutation in exon 21 (L858R) and recently found to have T790M resistant mutation, presented with left upper lobe lung mass in addition to left hilar and bilateral mediastinal lymphadenopathy as well as liver metastasis diagnosed in May of 2015.  PRIOR THERAPY: 1) Tarceva 150 mg by mouth daily. Started 11/25/2013. Status post 5 months of treatment discontinued today secondary to intolerance with persistent paronychia. 2) Tarceva 100 mg by mouth daily. Started 06/03/2014. Status post 8 months of treatment.  3) stereotactic radiotherapy to liver lesions under the care of Dr. Tammi Klippel. Last dose 03/02/2017  CURRENT THERAPY: Tagrisso 80 mg by mouth daily status post 27 months of treatment. First dose was given 03/04/2015.  INTERVAL HISTORY: Debra Hodges 67 y.o. female returns to the clinic today for follow-up visit accompanied by her husband.  The patient is feeling fine today with no specific complaints except for intermittent pain on the right upper quadrant.  She denied having any recent chest pain, shortness of breath, cough or hemoptysis.  She has no nausea, vomiting, diarrhea or constipation.  She denied having any skin rash.  She has no fever or chills.  She has been tolerating her treatment with Tagrisso fairly well.  She is here today for evaluation and repeat blood work.  MEDICAL HISTORY: Past Medical History:  Diagnosis Date  . High blood pressure   . Liver lesion 06/26/2016  . Non-small cell carcinoma of lung, stage 4 ([place]) dx'd 10/2013    ALLERGIES:  is allergic to penicillins.  MEDICATIONS:  Current Outpatient Medications  Medication Sig Dispense Refill  . acetaminophen (TYLENOL) 325 MG tablet Take 650 mg by mouth every 6 (six) hours as  needed. Reported on 06/15/2015    . calcium carbonate (OS-CAL) 600 MG TABS tablet Take 600 mg by mouth daily.     Marland Kitchen CLIMARA 0.1 MG/24HR patch 1 patch once a week.    . levothyroxine (SYNTHROID, LEVOTHROID) 50 MCG tablet Take 50 mcg by mouth daily.    Marland Kitchen loperamide (IMODIUM) 2 MG capsule Take by mouth as needed for diarrhea or loose stools. Reported on 11/22/2015    . metroNIDAZOLE (METROCREAM) 0.75 % cream Apply 1 application topically daily. Reported on 11/22/2015    . Multiple Vitamins-Calcium (ONE-A-DAY WOMENS PO) Take 1 tablet by mouth daily.    Marland Kitchen osimertinib mesylate (TAGRISSO) 80 MG tablet Take 1 tablet (80 mg total) by mouth daily. 30 tablet 2  . potassium chloride SA (K-DUR,KLOR-CON) 20 MEQ tablet 1 tablet 2 (two) times daily.    . Sodium Fluoride (CLINPRO 5000) 1.1 % PSTE Place 1 application onto teeth 3 (three) times daily.    Marland Kitchen spironolactone (ALDACTONE) 25 MG tablet 1 tablet daily.    . vitamin C (ASCORBIC ACID) 500 MG tablet Take 500 mg by mouth daily.     No current facility-administered medications for this visit.     SURGICAL HISTORY:  Past Surgical History:  Procedure Laterality Date  . ABDOMINAL HYSTERECTOMY    . BREAST BIOPSY     Right breast  . VIDEO BRONCHOSCOPY Bilateral 10/27/2013   Procedure: VIDEO BRONCHOSCOPY WITH FLUORO;  Surgeon: Collene Gobble, MD;  Location: [facility];  Service: Cardiopulmonary;  Laterality: Bilateral;    REVIEW OF SYSTEMS:  A comprehensive review of systems  was negative.   PHYSICAL EXAMINATION: General appearance: alert, cooperative and no distress Head: Normocephalic, without obvious abnormality, atraumatic Neck: no adenopathy, no JVD, supple, symmetrical, trachea midline and thyroid not enlarged, symmetric, no tenderness/mass/nodules Lymph nodes: Cervical, supraclavicular, and axillary nodes normal. Resp: clear to auscultation bilaterally Back: symmetric, no curvature. ROM normal. No CVA tenderness. Cardio: regular rate and rhythm, S1,  S2 normal, no murmur, click, rub or gallop GI: Tenderness to palpation at the right upper quadrant of the abdomen Extremities: extremities normal, atraumatic, no cyanosis or edema  ECOG PERFORMANCE STATUS: 0 - Asymptomatic  Blood pressure (!) 150/59, pulse 88, temperature 97.9 F (36.6 C), temperature source Oral, resp. rate 20, height '5\' 6"'$  (1.676 m), weight 212 lb 12.8 oz (96.5 kg), SpO2 100 %.  LABORATORY DATA: Lab Results  Component Value Date   WBC 5.4 07/12/2017   HGB 12.5 06/07/2017   HCT 35.8 07/12/2017   MCV 90.7 07/12/2017   PLT 144 (L) 07/12/2017      Chemistry      Component Value Date/Time   NA 140 06/07/2017 0818   NA 141 04/30/2017 1541   K 4.0 06/07/2017 0818   K 4.1 04/30/2017 1541   CL 107 06/07/2017 0818   CO2 25 06/07/2017 0818   CO2 26 04/30/2017 1541   BUN 14 06/07/2017 0818   BUN 14.1 04/30/2017 1541   CREATININE 0.86 06/07/2017 0818   CREATININE 0.8 04/30/2017 1541      Component Value Date/Time   CALCIUM 9.1 06/07/2017 0818   CALCIUM 9.6 04/30/2017 1541   ALKPHOS 100 06/07/2017 0818   ALKPHOS 105 04/30/2017 1541   AST 28 06/07/2017 0818   AST 40 (H) 04/30/2017 1541   ALT 19 06/07/2017 0818   ALT 27 04/30/2017 1541   BILITOT 0.6 06/07/2017 0818   BILITOT 0.55 04/30/2017 1541       RADIOGRAPHIC STUDIES: No results found.  ASSESSMENT AND PLAN:  This is a very pleasant 67 years old white female with a stage IV non-small cell lung cancer, adenocarcinoma with positive EGFR mutation in exon 21 (L858R) diagnosed in May 2015 status post treatment with Tarceva for 13 months discontinued secondary to disease progression and development of T790M resistant mutation. The patient is currently undergoing treatment with Tagrisso 80 mg by mouth daily status post 27 months. The patient continues to tolerate her treatment well with no concerning complaints. I recommended for her to continue her current treatment with Tagrisso with the same dose.  I will  see her back for follow-up visit in 1 month for evaluation after repeating CT scan of the chest, abdomen and pelvis for restaging of her disease. For hypertension, she was advised to take her blood pressure medication as prescribed and to monitor it closely at home. She was advised to call immediately if she has any concerning symptoms in the interval. The patient voices understanding of current disease status and treatment options and is in agreement with the current care plan. All questions were answered. The patient knows to call the clinic with any problems, questions or concerns. We can certainly see the patient much sooner if necessary.  Disclaimer: This note was dictated with voice recognition software. Similar sounding words can inadvertently be transcribed and may not be corrected upon review.

## 2017-07-12 NOTE — Telephone Encounter (Signed)
Scheduled appt per 2/14 los - Gave patient AVS and calender per los. Central radiology to contact patient with ct schedule.

## 2017-08-07 ENCOUNTER — Inpatient Hospital Stay: Payer: Medicare Other | Attending: Internal Medicine

## 2017-08-07 ENCOUNTER — Ambulatory Visit (HOSPITAL_COMMUNITY)
Admission: RE | Admit: 2017-08-07 | Discharge: 2017-08-07 | Disposition: A | Discharge status: Home / Self Care | Payer: Medicare Other | Source: Ambulatory Visit | Attending: Internal Medicine | Admitting: Internal Medicine

## 2017-08-07 DIAGNOSIS — C3412 Malignant neoplasm of upper lobe, left bronchus or lung: Secondary | ICD-10-CM | POA: Insufficient documentation

## 2017-08-07 DIAGNOSIS — Z79899 Other long term (current) drug therapy: Secondary | ICD-10-CM | POA: Insufficient documentation

## 2017-08-07 DIAGNOSIS — N200 Calculus of kidney: Secondary | ICD-10-CM | POA: Diagnosis not present

## 2017-08-07 DIAGNOSIS — I700 Atherosclerosis of aorta: Secondary | ICD-10-CM | POA: Insufficient documentation

## 2017-08-07 DIAGNOSIS — K769 Liver disease, unspecified: Secondary | ICD-10-CM | POA: Diagnosis not present

## 2017-08-07 DIAGNOSIS — C3490 Malignant neoplasm of unspecified part of unspecified bronchus or lung: Secondary | ICD-10-CM

## 2017-08-07 DIAGNOSIS — C787 Secondary malignant neoplasm of liver and intrahepatic bile duct: Secondary | ICD-10-CM | POA: Insufficient documentation

## 2017-08-07 LAB — CBC WITH DIFFERENTIAL (CANCER CENTER ONLY)
BASOS PCT: 0 %
Basophils Absolute: 0 10*3/uL (ref 0.0–0.1)
EOS ABS: 0.1 10*3/uL (ref 0.0–0.5)
Eosinophils Relative: 2 %
HCT: 36.9 % (ref 34.8–46.6)
Hemoglobin: 12.3 g/dL (ref 11.6–15.9)
Lymphocytes Relative: 25 %
Lymphs Abs: 1.5 10*3/uL (ref 0.9–3.3)
MCH: 30.5 pg (ref 25.1–34.0)
MCHC: 33.3 g/dL (ref 31.5–36.0)
MCV: 91.5 fL (ref 79.5–101.0)
MONO ABS: 0.5 10*3/uL (ref 0.1–0.9)
Monocytes Relative: 9 %
Neutro Abs: 3.9 10*3/uL (ref 1.5–6.5)
Neutrophils Relative %: 64 %
Platelet Count: 159 10*3/uL (ref 145–400)
RBC: 4.03 MIL/uL (ref 3.70–5.45)
RDW: 13.8 % (ref 11.2–14.5)
WBC Count: 6 10*3/uL (ref 3.9–10.3)

## 2017-08-07 LAB — CMP (CANCER CENTER ONLY)
ALBUMIN: 3.9 g/dL (ref 3.5–5.0)
ALT: 19 U/L (ref 0–55)
AST: 26 U/L (ref 5–34)
Alkaline Phosphatase: 113 U/L (ref 40–150)
Anion gap: 8 (ref 3–11)
BUN: 12 mg/dL (ref 7–26)
CALCIUM: 9.7 mg/dL (ref 8.4–10.4)
CO2: 26 mmol/L (ref 22–29)
CREATININE: 0.85 mg/dL (ref 0.60–1.10)
Chloride: 105 mmol/L (ref 98–109)
GFR, Est AFR Am: >60 mL/min (ref >60)
GFR, Estimated: >60 mL/min (ref >60)
GLUCOSE: 89 mg/dL (ref 70–140)
Potassium: 3.9 mmol/L (ref 3.5–5.1)
SODIUM: 139 mmol/L (ref 136–145)
Total Bilirubin: 0.6 mg/dL (ref 0.2–1.2)
Total Protein: 8.1 g/dL (ref 6.4–8.3)

## 2017-08-07 MED ORDER — IOPAMIDOL (ISOVUE-300) INJECTION 61%
INTRAVENOUS | Status: AC
  Administered 2017-08-07: 100 mL
  Filled 2017-08-07: qty 100

## 2017-08-07 MED ORDER — SODIUM CHLORIDE 0.9 % IJ SOLN
INTRAMUSCULAR | Status: AC
  Filled 2017-08-07: qty 50

## 2017-08-14 ENCOUNTER — Encounter: Payer: Self-pay | Admitting: Internal Medicine

## 2017-08-14 ENCOUNTER — Inpatient Hospital Stay (HOSPITAL_BASED_OUTPATIENT_CLINIC_OR_DEPARTMENT_OTHER): Payer: Medicare Other | Admitting: Internal Medicine

## 2017-08-14 VITALS — BP 134/63 | HR 80 | Temp 97.9°F | Resp 17 | Ht 66.0 in | Wt 211.9 lb

## 2017-08-14 DIAGNOSIS — C3412 Malignant neoplasm of upper lobe, left bronchus or lung: Secondary | ICD-10-CM

## 2017-08-14 DIAGNOSIS — Z5111 Encounter for antineoplastic chemotherapy: Secondary | ICD-10-CM

## 2017-08-14 DIAGNOSIS — I700 Atherosclerosis of aorta: Secondary | ICD-10-CM | POA: Diagnosis not present

## 2017-08-14 DIAGNOSIS — Z79899 Other long term (current) drug therapy: Secondary | ICD-10-CM

## 2017-08-14 DIAGNOSIS — C787 Secondary malignant neoplasm of liver and intrahepatic bile duct: Secondary | ICD-10-CM | POA: Diagnosis not present

## 2017-08-14 DIAGNOSIS — N200 Calculus of kidney: Secondary | ICD-10-CM | POA: Diagnosis not present

## 2017-08-14 DIAGNOSIS — C3492 Malignant neoplasm of unspecified part of left bronchus or lung: Secondary | ICD-10-CM

## 2017-08-14 NOTE — Progress Notes (Signed)
[place] Telephone:(336) [phone]   Fax:([phone]) [phone]  OFFICE PROGRESS NOTE  System, Pcp Not In No address on file  DIAGNOSIS: Stage IV (T1b, N3, M1b) non-small cell lung cancer, adenocarcinoma with positive EGFR mutation in exon 21 (L858R) and recently found to have T790M resistant mutation, presented with left upper lobe lung mass in addition to left hilar and bilateral mediastinal lymphadenopathy as well as liver metastasis diagnosed in May of 2015.  PRIOR THERAPY: 1) Tarceva 150 mg by mouth daily. Started 11/25/2013. Status post 5 months of treatment discontinued today secondary to intolerance with persistent paronychia. 2) Tarceva 100 mg by mouth daily. Started 06/03/2014. Status post 8 months of treatment.  3) stereotactic radiotherapy to liver lesions under the care of Dr. Tammi Klippel. Last dose 03/02/2017  CURRENT THERAPY: Tagrisso 80 mg by mouth daily status post 28 months of treatment. First dose was given 03/04/2015.  INTERVAL HISTORY: Debra Hodges 67 y.o. female returns to the clinic today for follow-up visit accompanied by her husband.  The patient is feeling fine today with no specific complaints.  She denied having any fatigue or weakness.  She denied having any chest pain, shortness breath, cough or hemoptysis.  She denied having any recent weight loss or night sweats.  She has no nausea, vomiting, diarrhea or constipation.  She denied having any skin rash.  She continues to tolerate her treatment with Tagrisso fairly well.  The patient had repeat CT scan of the chest, abdomen and pelvis performed recently and she is here for evaluation and discussion of her scan results.  MEDICAL HISTORY: Past Medical History:  Diagnosis Date  . High blood pressure   . Liver lesion 06/26/2016  . Non-small cell carcinoma of lung, stage 4 ([place]) dx'd 10/2013    ALLERGIES:  is allergic to penicillins.  MEDICATIONS:  Current Outpatient Medications  Medication Sig  Dispense Refill  . acetaminophen (TYLENOL) 325 MG tablet Take 650 mg by mouth every 6 (six) hours as needed. Reported on 06/15/2015    . calcium carbonate (OS-CAL) 600 MG TABS tablet Take 600 mg by mouth daily.     Marland Kitchen CLIMARA 0.1 MG/24HR patch 1 patch once a week.    . levothyroxine (SYNTHROID, LEVOTHROID) 50 MCG tablet Take 50 mcg by mouth daily.    Marland Kitchen loperamide (IMODIUM) 2 MG capsule Take by mouth as needed for diarrhea or loose stools. Reported on 11/22/2015    . metroNIDAZOLE (METROCREAM) 0.75 % cream Apply 1 application topically daily. Reported on 11/22/2015    . Multiple Vitamins-Calcium (ONE-A-DAY WOMENS PO) Take 1 tablet by mouth daily.    Marland Kitchen osimertinib mesylate (TAGRISSO) 80 MG tablet Take 1 tablet (80 mg total) by mouth daily. 30 tablet 2  . potassium chloride SA (K-DUR,KLOR-CON) 20 MEQ tablet 1 tablet 2 (two) times daily.    . Sodium Fluoride (CLINPRO 5000) 1.1 % PSTE Place 1 application onto teeth 3 (three) times daily.    Marland Kitchen spironolactone (ALDACTONE) 25 MG tablet 1 tablet daily.    . vitamin C (ASCORBIC ACID) 500 MG tablet Take 500 mg by mouth daily.     No current facility-administered medications for this visit.     SURGICAL HISTORY:  Past Surgical History:  Procedure Laterality Date  . ABDOMINAL HYSTERECTOMY    . BREAST BIOPSY     Right breast  . VIDEO BRONCHOSCOPY Bilateral 10/27/2013   Procedure: VIDEO BRONCHOSCOPY WITH FLUORO;  Surgeon: Collene Gobble, MD;  Location: [facility];  Service: Cardiopulmonary;  Laterality: Bilateral;    REVIEW OF SYSTEMS:  Constitutional: negative Eyes: negative Ears, nose, mouth, throat, and face: negative Respiratory: negative Cardiovascular: negative Gastrointestinal: negative Genitourinary:negative Integument/breast: negative Hematologic/lymphatic: negative Musculoskeletal:negative Neurological: negative Behavioral/Psych: negative Endocrine: negative Allergic/Immunologic: negative   PHYSICAL EXAMINATION: General appearance:  alert, cooperative and no distress Head: Normocephalic, without obvious abnormality, atraumatic Neck: no adenopathy, no JVD, supple, symmetrical, trachea midline and thyroid not enlarged, symmetric, no tenderness/mass/nodules Lymph nodes: Cervical, supraclavicular, and axillary nodes normal. Resp: clear to auscultation bilaterally Back: symmetric, no curvature. ROM normal. No CVA tenderness. Cardio: regular rate and rhythm, S1, S2 normal, no murmur, click, rub or gallop GI: soft, non-tender; bowel sounds normal; no masses,  no organomegaly Extremities: extremities normal, atraumatic, no cyanosis or edema Neurologic: Alert and oriented X 3, normal strength and tone. Normal symmetric reflexes. Normal coordination and gait  ECOG PERFORMANCE STATUS: 0 - Asymptomatic  Blood pressure 134/63, pulse 80, temperature 97.9 F (36.6 C), temperature source Oral, resp. rate 17, height '5\' 6"'  (1.676 m), weight 211 lb 14.4 oz (96.1 kg), SpO2 100 %.  LABORATORY DATA: Lab Results  Component Value Date   WBC 6.0 08/07/2017   HGB 12.5 06/07/2017   HCT 36.9 08/07/2017   MCV 91.5 08/07/2017   PLT 159 08/07/2017      Chemistry      Component Value Date/Time   NA 139 08/07/2017 1559   NA 141 04/30/2017 1541   K 3.9 08/07/2017 1559   K 4.1 04/30/2017 1541   CL 105 08/07/2017 1559   CO2 26 08/07/2017 1559   CO2 26 04/30/2017 1541   BUN 12 08/07/2017 1559   BUN 14.1 04/30/2017 1541   CREATININE 0.85 08/07/2017 1559   CREATININE 0.8 04/30/2017 1541      Component Value Date/Time   CALCIUM 9.7 08/07/2017 1559   CALCIUM 9.6 04/30/2017 1541   ALKPHOS 113 08/07/2017 1559   ALKPHOS 105 04/30/2017 1541   AST 26 08/07/2017 1559   AST 40 (H) 04/30/2017 1541   ALT 19 08/07/2017 1559   ALT 27 04/30/2017 1541   BILITOT 0.6 08/07/2017 1559   BILITOT 0.55 04/30/2017 1541       RADIOGRAPHIC STUDIES: Ct Chest W Contrast  Result Date: 08/08/2017 CLINICAL DATA:  Stage IV non-small lung cancer. On oral  chemotherapy. EXAM: CT CHEST, ABDOMEN, AND PELVIS WITH CONTRAST TECHNIQUE: Multidetector CT imaging of the chest, abdomen and pelvis was performed following the standard protocol during bolus administration of intravenous contrast. CONTRAST:  159m ISOVUE-300 IOPAMIDOL (ISOVUE-300) INJECTION 61% COMPARISON:  04/30/2017. FINDINGS: CT CHEST FINDINGS Cardiovascular: Atherosclerotic calcification of the arterial vasculature. Heart is mildly enlarged. No pericardial effusion. Mediastinum/Nodes: No pathologically enlarged mediastinal, hilar or axillary lymph nodes. Esophagus is grossly unremarkable. Tiny hiatal hernia. Lungs/Pleura: Spiculated left upper lobe nodule has enlarged slightly, now measuring 1.4 x 1.9 cm (series 6, image 50), previously 1.1 x 1 5 cm. Soft tissue extends to the left hilum. 5 mm adjacent left upper lobe nodule (image 57), new. Nodular soft tissue with surrounding volume loss in the lingula measures 1.9 x 2.4 cm (image 71), grossly stable. 4 mm right upper lobe nodule (image 50), unchanged. No pleural fluid. Airway is unremarkable. Musculoskeletal: Degenerative changes in the spine. No worrisome lytic or sclerotic lesions. CT ABDOMEN PELVIS FINDINGS Hepatobiliary: Low-attenuation lesion in the dome of the liver measures 1.3 cm, likely stable. Liver and gallbladder are otherwise unremarkable. No biliary ductal dilatation. Pancreas: Negative. Spleen: Subcentimeter low-attenuation lesion in the spleen  is unchanged. Adrenals/Urinary Tract: Adrenal glands are unremarkable. Stones in the kidneys bilaterally. Subcentimeter low-attenuation lesion in interpolar right kidney is too small to characterize. 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 Ureters are decompressed. Bladder is grossly unremarkable. Stomach/Bowel: Tiny hiatal hernia. Stomach, small bowel and colon are unremarkable. Appendix is not readily visualized. Vascular/Lymphatic: Atherosclerotic  calcification of the arterial vasculature without abdominal aortic aneurysm. No pathologically enlarged lymph nodes. Reproductive: Hysterectomy. Mild asymmetry of the right vaginal cuff, as before. No adnexal mass. Other: No free fluid.  Mesenteries and peritoneum are unremarkable. Musculoskeletal: Degenerative changes in the spine. No worrisome lytic or sclerotic lesions. Minimal grade 1 anterolisthesis of L4 on L5. IMPRESSION: 1. Slight enlargement of a left upper lobe nodule. Adjacent 5 mm left upper lobe nodule is new. Findings are consistent with disease progression. 2. Liver lesion is grossly stable. 3.  Aortic atherosclerosis (ICD10-170.0). 4. Bilateral renal stones. Electronically Signed   By: Lorin Picket M.D.   On: 08/08/2017 09:57   Ct Abdomen Pelvis W Contrast  Result Date: 08/08/2017 CLINICAL DATA:  Stage IV non-small lung cancer. On oral chemotherapy. EXAM: CT CHEST, ABDOMEN, AND PELVIS WITH CONTRAST TECHNIQUE: Multidetector CT imaging of the chest, abdomen and pelvis was performed following the standard protocol during bolus administration of intravenous contrast. CONTRAST:  178m ISOVUE-300 IOPAMIDOL (ISOVUE-300) INJECTION 61% COMPARISON:  04/30/2017. FINDINGS: CT CHEST FINDINGS Cardiovascular: Atherosclerotic calcification of the arterial vasculature. Heart is mildly enlarged. No pericardial effusion. Mediastinum/Nodes: No pathologically enlarged mediastinal, hilar or axillary lymph nodes. Esophagus is grossly unremarkable. Tiny hiatal hernia. Lungs/Pleura: Spiculated left upper lobe nodule has enlarged slightly, now measuring 1.4 x 1.9 cm (series 6, image 50), previously 1.1 x 1 5 cm. Soft tissue extends to the left hilum. 5 mm adjacent left upper lobe nodule (image 57), new. Nodular soft tissue with surrounding volume loss in the lingula measures 1.9 x 2.4 cm (image 71), grossly stable. 4 mm right upper lobe nodule (image 50), unchanged. No pleural fluid. Airway is unremarkable.  Musculoskeletal: Degenerative changes in the spine. No worrisome lytic or sclerotic lesions. CT ABDOMEN PELVIS FINDINGS Hepatobiliary: Low-attenuation lesion in the dome of the liver measures 1.3 cm, likely stable. Liver and gallbladder are otherwise unremarkable. No biliary ductal dilatation. Pancreas: Negative. Spleen: Subcentimeter low-attenuation lesion in the spleen is unchanged. Adrenals/Urinary Tract: Adrenal glands are unremarkable. Stones in the kidneys bilaterally. Subcentimeter low-attenuation lesion in interpolar right kidney is too small to characterize. 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 Ureters are decompressed. Bladder is grossly unremarkable. Stomach/Bowel: Tiny hiatal hernia. Stomach, small bowel and colon are unremarkable. Appendix is not readily visualized. Vascular/Lymphatic: Atherosclerotic calcification of the arterial vasculature without abdominal aortic aneurysm. No pathologically enlarged lymph nodes. Reproductive: Hysterectomy. Mild asymmetry of the right vaginal cuff, as before. No adnexal mass. Other: No free fluid.  Mesenteries and peritoneum are unremarkable. Musculoskeletal: Degenerative changes in the spine. No worrisome lytic or sclerotic lesions. Minimal grade 1 anterolisthesis of L4 on L5. IMPRESSION: 1. Slight enlargement of a left upper lobe nodule. Adjacent 5 mm left upper lobe nodule is new. Findings are consistent with disease progression. 2. Liver lesion is grossly stable. 3.  Aortic atherosclerosis (ICD10-170.0). 4. Bilateral renal stones. Electronically Signed   By: MLorin PicketM.D.   On: 08/08/2017 09:57    ASSESSMENT AND PLAN:  This is a very pleasant 67years old white female with a stage IV  non-small cell lung cancer, adenocarcinoma with positive EGFR mutation in exon 21 (L858R) diagnosed in May 2015 status post treatment with Tarceva for 13 months discontinued secondary to disease progression and  development of T790M resistant mutation. The patient is currently undergoing treatment with Tagrisso 80 mg by mouth daily status post 28 months. The patient continues to tolerate her treatment fairly well with no concerning adverse effects. The recent CT scan of the chest, abdomen and pelvis showed stable disease except for a slight enlargement of left upper lobe pulmonary nodule.  I personally and independently reviewed the scan images and discussed the results with the patient and her husband.  I recommended for her to continue her current treatment with Tagrisso for now.  I will continue to monitor the left upper lobe pulmonary nodule closely and if it continues to increase in size, we may consider SBRT to that area. The patient and her husband agreed to the current plan. I will see her back for follow-up visit in 1 month for evaluation with repeat blood count. She was advised to call immediately if she has any concerning symptoms in the interval. The patient voices understanding of current disease status and treatment options and is in agreement with the current care plan. All questions were answered. The patient knows to call the clinic with any problems, questions or concerns. We can certainly see the patient much sooner if necessary.  Disclaimer: This note was dictated with voice recognition software. Similar sounding words can inadvertently be transcribed and may not be corrected upon review.

## 2017-09-12 ENCOUNTER — Encounter: Payer: Self-pay | Admitting: Oncology

## 2017-09-12 ENCOUNTER — Inpatient Hospital Stay: Payer: Medicare Other

## 2017-09-12 ENCOUNTER — Inpatient Hospital Stay: Payer: Medicare Other | Attending: Internal Medicine | Admitting: Oncology

## 2017-09-12 ENCOUNTER — Telehealth: Payer: Self-pay | Admitting: Internal Medicine

## 2017-09-12 VITALS — BP 126/56 | HR 80 | Temp 98.0°F | Resp 17 | Ht 66.0 in | Wt 211.7 lb

## 2017-09-12 DIAGNOSIS — Z79899 Other long term (current) drug therapy: Secondary | ICD-10-CM | POA: Diagnosis not present

## 2017-09-12 DIAGNOSIS — C3492 Malignant neoplasm of unspecified part of left bronchus or lung: Secondary | ICD-10-CM

## 2017-09-12 DIAGNOSIS — Z90710 Acquired absence of both cervix and uterus: Secondary | ICD-10-CM | POA: Insufficient documentation

## 2017-09-12 DIAGNOSIS — C3412 Malignant neoplasm of upper lobe, left bronchus or lung: Secondary | ICD-10-CM

## 2017-09-12 DIAGNOSIS — Z5111 Encounter for antineoplastic chemotherapy: Secondary | ICD-10-CM

## 2017-09-12 DIAGNOSIS — I10 Essential (primary) hypertension: Secondary | ICD-10-CM | POA: Diagnosis not present

## 2017-09-12 DIAGNOSIS — C787 Secondary malignant neoplasm of liver and intrahepatic bile duct: Secondary | ICD-10-CM | POA: Diagnosis not present

## 2017-09-12 DIAGNOSIS — R599 Enlarged lymph nodes, unspecified: Secondary | ICD-10-CM | POA: Diagnosis not present

## 2017-09-12 LAB — CBC WITH DIFFERENTIAL (CANCER CENTER ONLY)
Basophils Absolute: 0 10*3/uL (ref 0.0–0.1)
Basophils Relative: 0 %
EOS PCT: 3 %
Eosinophils Absolute: 0.2 10*3/uL (ref 0.0–0.5)
HCT: 35.5 % (ref 34.8–46.6)
HEMOGLOBIN: 11.9 g/dL (ref 11.6–15.9)
LYMPHS ABS: 1.4 10*3/uL (ref 0.9–3.3)
LYMPHS PCT: 24 %
MCH: 31.1 pg (ref 25.1–34.0)
MCHC: 33.5 g/dL (ref 31.5–36.0)
MCV: 92.7 fL (ref 79.5–101.0)
MONOS PCT: 11 %
Monocytes Absolute: 0.7 10*3/uL (ref 0.1–0.9)
NEUTROS PCT: 62 %
Neutro Abs: 3.7 10*3/uL (ref 1.5–6.5)
Platelet Count: 127 10*3/uL — ABNORMAL LOW (ref 145–400)
RBC: 3.83 MIL/uL (ref 3.70–5.45)
RDW: 13.5 % (ref 11.2–14.5)
WBC Count: 5.9 10*3/uL (ref 3.9–10.3)

## 2017-09-12 LAB — CMP (CANCER CENTER ONLY)
ALK PHOS: 97 U/L (ref 40–150)
ALT: 18 U/L (ref 0–55)
AST: 23 U/L (ref 5–34)
Albumin: 3.6 g/dL (ref 3.5–5.0)
Anion gap: 6 (ref 3–11)
BUN: 14 mg/dL (ref 7–26)
CALCIUM: 9.1 mg/dL (ref 8.4–10.4)
CO2: 26 mmol/L (ref 22–29)
CREATININE: 0.79 mg/dL (ref 0.60–1.10)
Chloride: 108 mmol/L (ref 98–109)
Glucose, Bld: 87 mg/dL (ref 70–140)
Potassium: 4.2 mmol/L (ref 3.5–5.1)
Sodium: 140 mmol/L (ref 136–145)
TOTAL PROTEIN: 7.3 g/dL (ref 6.4–8.3)
Total Bilirubin: 0.4 mg/dL (ref 0.2–1.2)

## 2017-09-12 NOTE — Assessment & Plan Note (Signed)
This is a very pleasant 67 year old white female with a stage IV non-small cell lung cancer, adenocarcinoma with positive EGFR mutation in exon 21 (L858R) diagnosed in May 2015 status post treatment with Tarceva for 13 months discontinued secondary to disease progression and development of T790M resistant mutation. The patient is currently undergoing treatment with Tagrisso 80 mg by mouth daily status post 29 months. The patient continues to tolerate her treatment fairly well with no concerning adverse effects. Labs remained stable.  Recommend for her to continue her current treatment with Tagrisso. She will follow-up in 1 month for evaluation and repeat lab work.  She was advised to call immediately if she has any concerning symptoms in the interval. The patient voices understanding of current disease status and treatment options and is in agreement with the current care plan. All questions were answered. The patient knows to call the clinic with any problems, questions or concerns. We can certainly see the patient much sooner if necessary.

## 2017-09-12 NOTE — Progress Notes (Signed)
[facility] OFFICE PROGRESS NOTE  System, Pcp Not In No address on file  DIAGNOSIS: Stage IV (T1b, N3, M1b) non-small cell lung cancer, adenocarcinoma with positive EGFR mutation in exon 21 (L858R) and recently found to have T790M resistant mutation, presented with left upper lobe lung mass in addition to left hilar and bilateral mediastinal lymphadenopathy as well as liver metastasis diagnosed in May of 2015.  PRIOR THERAPY: 1) Tarceva 150 mg by mouth daily. Started 11/25/2013. Status post 5 months of treatment discontinued today secondary to intolerance with persistent paronychia. 2) Tarceva 100 mg by mouth daily. Started 06/03/2014. Status post 8 months of treatment.  3) stereotactic radiotherapy to liver lesions under the care of Dr. Tammi Klippel. Last dose 03/02/2017  CURRENT THERAPY: Tagrisso 80 mg by mouth daily status post 29 months of treatment. First dose was given 03/04/2015.  INTERVAL HISTORY: Debra Hodges 67 y.o. female returns for routine follow-up visit accompanied by her husband.  The patient is feeling fine today has no specific complaints.  Patient denies fevers chills.  Denies chest pain, shortness breath, cough, hemoptysis.  Denies nausea, vomiting, constipation, diarrhea.  Denies skin rashes.  The patient continues to tolerate her treatment Tagrisso fairly well.  The patient is here for evaluation repeat lab work.  MEDICAL HISTORY: Past Medical History:  Diagnosis Date  . High blood pressure   . Liver lesion 06/26/2016  . Non-small cell carcinoma of lung, stage 4 ([place]) dx'd 10/2013    ALLERGIES:  is allergic to penicillins.  MEDICATIONS:  Current Outpatient Medications  Medication Sig Dispense Refill  . acetaminophen (TYLENOL) 325 MG tablet Take 650 mg by mouth every 6 (six) hours as needed. Reported on 06/15/2015    . calcium carbonate (OS-CAL) 600 MG TABS tablet Take 600 mg by mouth daily.     Marland Kitchen CLIMARA 0.1 MG/24HR patch 1 patch once a week.    .  levothyroxine (SYNTHROID, LEVOTHROID) 50 MCG tablet Take 50 mcg by mouth daily.    Marland Kitchen loperamide (IMODIUM) 2 MG capsule Take by mouth as needed for diarrhea or loose stools. Reported on 11/22/2015    . metroNIDAZOLE (METROCREAM) 0.75 % cream Apply 1 application topically daily. Reported on 11/22/2015    . Multiple Vitamins-Calcium (ONE-A-DAY WOMENS PO) Take 1 tablet by mouth daily.    Marland Kitchen osimertinib mesylate (TAGRISSO) 80 MG tablet Take 1 tablet (80 mg total) by mouth daily. 30 tablet 2  . potassium chloride SA (K-DUR,KLOR-CON) 20 MEQ tablet 1 tablet 2 (two) times daily.    . Sodium Fluoride (CLINPRO 5000) 1.1 % PSTE Place 1 application onto teeth 3 (three) times daily.    Marland Kitchen spironolactone (ALDACTONE) 25 MG tablet 1 tablet daily.    . vitamin C (ASCORBIC ACID) 500 MG tablet Take 500 mg by mouth daily.     No current facility-administered medications for this visit.     SURGICAL HISTORY:  Past Surgical History:  Procedure Laterality Date  . ABDOMINAL HYSTERECTOMY    . BREAST BIOPSY     Right breast  . VIDEO BRONCHOSCOPY Bilateral 10/27/2013   Procedure: VIDEO BRONCHOSCOPY WITH FLUORO;  Surgeon: Collene Gobble, MD;  Location: [facility];  Service: Cardiopulmonary;  Laterality: Bilateral;    REVIEW OF SYSTEMS:   Review of Systems  Constitutional: Negative for appetite change, chills, fatigue, fever and unexpected weight change.  HENT:   Negative for mouth sores, nosebleeds, sore throat and trouble swallowing.   Eyes: Negative for eye problems and icterus.  Respiratory: Negative  for cough, hemoptysis, shortness of breath and wheezing.   Cardiovascular: Negative for chest pain and leg swelling.  Gastrointestinal: Negative for abdominal pain, constipation, diarrhea, nausea and vomiting.  Genitourinary: Negative for bladder incontinence, difficulty urinating, dysuria, frequency and hematuria.   Musculoskeletal: Negative for back pain, gait problem, neck pain and neck stiffness.  Skin: Negative  for itching and rash.  Neurological: Negative for dizziness, extremity weakness, gait problem, headaches, light-headedness and seizures.  Hematological: Negative for adenopathy. Does not bruise/bleed easily.  Psychiatric/Behavioral: Negative for confusion, depression and sleep disturbance. The patient is not nervous/anxious.     PHYSICAL EXAMINATION:  Blood pressure (!) 126/56, pulse 80, temperature 98 F (36.7 C), temperature source Oral, resp. rate 17, height '5\' 6"'$  (1.676 m), weight 211 lb 11.2 oz (96 kg), SpO2 100 %.  ECOG PERFORMANCE STATUS: 1 - Symptomatic but completely ambulatory  Physical Exam  Constitutional: Oriented to person, place, and time and well-developed, well-nourished, and in no distress. No distress.  HENT:  Head: Normocephalic and atraumatic.  Mouth/Throat: Oropharynx is clear and moist. No oropharyngeal exudate.  Eyes: Conjunctivae are normal. Right eye exhibits no discharge. Left eye exhibits no discharge. No scleral icterus.  Neck: Normal range of motion. Neck supple.  Cardiovascular: Normal rate, regular rhythm, normal heart sounds and intact distal pulses.   Pulmonary/Chest: Effort normal and breath sounds normal. No respiratory distress. No wheezes. No rales.  Abdominal: Soft. Bowel sounds are normal. Exhibits no distension and no mass. There is no tenderness.  Musculoskeletal: Normal range of motion. Exhibits no edema.  Lymphadenopathy:    No cervical adenopathy.  Neurological: Alert and oriented to person, place, and time. Exhibits normal muscle tone. Gait normal. Coordination normal.  Skin: Skin is warm and dry. No rash noted. Not diaphoretic. No erythema. No pallor.  Psychiatric: Mood, memory and judgment normal.  Vitals reviewed.  LABORATORY DATA: Lab Results  Component Value Date   WBC 5.9 09/12/2017   HGB 12.5 06/07/2017   HCT 35.5 09/12/2017   MCV 92.7 09/12/2017   PLT 127 (L) 09/12/2017      Chemistry      Component Value Date/Time   NA  140 09/12/2017 0910   NA 141 04/30/2017 1541   K 4.2 09/12/2017 0910   K 4.1 04/30/2017 1541   CL 108 09/12/2017 0910   CO2 26 09/12/2017 0910   CO2 26 04/30/2017 1541   BUN 14 09/12/2017 0910   BUN 14.1 04/30/2017 1541   CREATININE 0.79 09/12/2017 0910   CREATININE 0.8 04/30/2017 1541      Component Value Date/Time   CALCIUM 9.1 09/12/2017 0910   CALCIUM 9.6 04/30/2017 1541   ALKPHOS 97 09/12/2017 0910   ALKPHOS 105 04/30/2017 1541   AST 23 09/12/2017 0910   AST 40 (H) 04/30/2017 1541   ALT 18 09/12/2017 0910   ALT 27 04/30/2017 1541   BILITOT 0.4 09/12/2017 0910   BILITOT 0.55 04/30/2017 1541       RADIOGRAPHIC STUDIES:  No results found.   ASSESSMENT/PLAN:  Malignant neoplasm of upper lobe of left lung This is a very pleasant 67 year old white female with a stage IV non-small cell lung cancer, adenocarcinoma with positive EGFR mutation in exon 21 (L858R) diagnosed in May 2015 status post treatment with Tarceva for 13 months discontinued secondary to disease progression and development of T790M resistant mutation. The patient is currently undergoing treatment with Tagrisso 80 mg by mouth daily status post 29 months. The patient continues to tolerate her  treatment fairly well with no concerning adverse effects. Labs remained stable.  Recommend for her to continue her current treatment with Tagrisso. She will follow-up in 1 month for evaluation and repeat lab work.  She was advised to call immediately if she has any concerning symptoms in the interval. The patient voices understanding of current disease status and treatment options and is in agreement with the current care plan. All questions were answered. The patient knows to call the clinic with any problems, questions or concerns. We can certainly see the patient much sooner if necessary.   Orders Placed This Encounter  Procedures  . CBC with Differential (Cancer Center Only)    Standing Status:   Future     Standing Expiration Date:   09/13/2018  . CMP ([facility] only)    Standing Status:   Future    Standing Expiration Date:   09/13/2018   Mikey Bussing, DNP, AGPCNP-BC, AOCNP 09/12/17

## 2017-09-12 NOTE — Telephone Encounter (Signed)
Appts scheduled AVS./Calendar printed per 4/17 los

## 2017-10-12 ENCOUNTER — Ambulatory Visit: Payer: Medicare Other | Admitting: Oncology

## 2017-10-15 ENCOUNTER — Telehealth: Payer: Self-pay

## 2017-10-15 ENCOUNTER — Encounter: Payer: Self-pay | Admitting: Internal Medicine

## 2017-10-15 ENCOUNTER — Inpatient Hospital Stay (HOSPITAL_BASED_OUTPATIENT_CLINIC_OR_DEPARTMENT_OTHER): Payer: Medicare Other | Admitting: Internal Medicine

## 2017-10-15 ENCOUNTER — Inpatient Hospital Stay: Payer: Medicare Other | Attending: Internal Medicine

## 2017-10-15 VITALS — BP 139/58 | HR 78 | Temp 98.4°F | Resp 18 | Ht 66.0 in | Wt 210.4 lb

## 2017-10-15 DIAGNOSIS — Z79899 Other long term (current) drug therapy: Secondary | ICD-10-CM | POA: Insufficient documentation

## 2017-10-15 DIAGNOSIS — I10 Essential (primary) hypertension: Secondary | ICD-10-CM | POA: Insufficient documentation

## 2017-10-15 DIAGNOSIS — C778 Secondary and unspecified malignant neoplasm of lymph nodes of multiple regions: Secondary | ICD-10-CM

## 2017-10-15 DIAGNOSIS — C3412 Malignant neoplasm of upper lobe, left bronchus or lung: Secondary | ICD-10-CM

## 2017-10-15 DIAGNOSIS — Z923 Personal history of irradiation: Secondary | ICD-10-CM | POA: Diagnosis not present

## 2017-10-15 DIAGNOSIS — C787 Secondary malignant neoplasm of liver and intrahepatic bile duct: Secondary | ICD-10-CM | POA: Diagnosis not present

## 2017-10-15 DIAGNOSIS — Z9221 Personal history of antineoplastic chemotherapy: Secondary | ICD-10-CM | POA: Insufficient documentation

## 2017-10-15 DIAGNOSIS — Z5111 Encounter for antineoplastic chemotherapy: Secondary | ICD-10-CM

## 2017-10-15 DIAGNOSIS — C3490 Malignant neoplasm of unspecified part of unspecified bronchus or lung: Secondary | ICD-10-CM

## 2017-10-15 DIAGNOSIS — C3492 Malignant neoplasm of unspecified part of left bronchus or lung: Secondary | ICD-10-CM

## 2017-10-15 LAB — CBC WITH DIFFERENTIAL (CANCER CENTER ONLY)
BASOS PCT: 0 %
Basophils Absolute: 0 10*3/uL (ref 0.0–0.1)
Eosinophils Absolute: 0.2 10*3/uL (ref 0.0–0.5)
Eosinophils Relative: 2 %
HEMATOCRIT: 36.3 % (ref 34.8–46.6)
HEMOGLOBIN: 12 g/dL (ref 11.6–15.9)
Lymphocytes Relative: 18 %
Lymphs Abs: 1.3 10*3/uL (ref 0.9–3.3)
MCH: 30.7 pg (ref 25.1–34.0)
MCHC: 33.1 g/dL (ref 31.5–36.0)
MCV: 92.8 fL (ref 79.5–101.0)
MONO ABS: 0.7 10*3/uL (ref 0.1–0.9)
MONOS PCT: 10 %
NEUTROS ABS: 5 10*3/uL (ref 1.5–6.5)
NEUTROS PCT: 70 %
Platelet Count: 140 10*3/uL — ABNORMAL LOW (ref 145–400)
RBC: 3.91 MIL/uL (ref 3.70–5.45)
RDW: 13.7 % (ref 11.2–14.5)
WBC Count: 7.2 10*3/uL (ref 3.9–10.3)

## 2017-10-15 LAB — CMP (CANCER CENTER ONLY)
ALBUMIN: 3.7 g/dL (ref 3.5–5.0)
ALK PHOS: 106 U/L (ref 40–150)
ALT: 20 U/L (ref 0–55)
ANION GAP: 8 (ref 3–11)
AST: 24 U/L (ref 5–34)
BILIRUBIN TOTAL: 0.6 mg/dL (ref 0.2–1.2)
BUN: 16 mg/dL (ref 7–26)
CALCIUM: 9.2 mg/dL (ref 8.4–10.4)
CO2: 25 mmol/L (ref 22–29)
Chloride: 107 mmol/L (ref 98–109)
Creatinine: 0.81 mg/dL (ref 0.60–1.10)
GFR, Est AFR Am: >60 mL/min (ref >60)
GLUCOSE: 102 mg/dL (ref 70–140)
Potassium: 3.9 mmol/L (ref 3.5–5.1)
Sodium: 140 mmol/L (ref 136–145)
TOTAL PROTEIN: 7.3 g/dL (ref 6.4–8.3)

## 2017-10-15 NOTE — Telephone Encounter (Signed)
Printed avs and calender of upcoming appointment . Per 5/20 los 

## 2017-10-15 NOTE — Progress Notes (Signed)
[facility] [phone]   [phone]  OFFICE PROGRESS NOTE  System, Pcp Not In No address on file  DIAGNOSIS: Stage IV (T1b, N3, M1b) non-small cell lung cancer, adenocarcinoma with positive EGFR mutation in exon 21 (L858R) and recently found to have T790M resistant mutation, presented with left upper lobe lung mass in addition to left hilar and bilateral mediastinal lymphadenopathy as well as liver metastasis diagnosed in May of 2015.  PRIOR THERAPY: 1) Tarceva 150 mg by mouth daily. Started 11/25/2013. Status post 5 months of treatment discontinued today secondary to intolerance with persistent paronychia. 2) Tarceva 100 mg by mouth daily. Started 06/03/2014. Status post 8 months of treatment.  3) stereotactic radiotherapy to liver lesions under the care of Dr. Tammi Klippel. Last dose 03/02/2017  CURRENT THERAPY: Tagrisso 80 mg by mouth daily status post 30 months of treatment. First dose was given 03/04/2015.  INTERVAL HISTORY: Debra Hodges 67 y.o. female returns to the clinic today for follow-up visit accompanied by her husband.  The patient is feeling fine today with no specific complaints except for persistent cough productive of whitish sputum.  She denied having any fever or chills.  She has no chest pain, shortness of breath or hemoptysis.  She denied having any recent weight loss or night sweats.  She has no nausea, vomiting, diarrhea or constipation.  The patient is here today for evaluation and repeat blood work.  MEDICAL HISTORY: Past Medical History:  Diagnosis Date  . High blood pressure   . Liver lesion 06/26/2016  . Non-small cell carcinoma of lung, stage 4 ([place]) dx'd 10/2013    ALLERGIES:  is allergic to penicillins.  MEDICATIONS:  Current Outpatient Medications  Medication Sig Dispense Refill  . acetaminophen (TYLENOL) 325 MG tablet Take 650 mg by mouth every 6 (six) hours as needed. Reported on 06/15/2015    . calcium carbonate  (OS-CAL) 600 MG TABS tablet Take 600 mg by mouth daily.     Marland Kitchen CLIMARA 0.1 MG/24HR patch 1 patch once a week.    . levothyroxine (SYNTHROID, LEVOTHROID) 50 MCG tablet Take 50 mcg by mouth daily.    Marland Kitchen loperamide (IMODIUM) 2 MG capsule Take by mouth as needed for diarrhea or loose stools. Reported on 11/22/2015    . metroNIDAZOLE (METROCREAM) 0.75 % cream Apply 1 application topically daily. Reported on 11/22/2015    . Multiple Vitamins-Calcium (ONE-A-DAY WOMENS PO) Take 1 tablet by mouth daily.    Marland Kitchen osimertinib mesylate (TAGRISSO) 80 MG tablet Take 1 tablet (80 mg total) by mouth daily. 30 tablet 2  . potassium chloride SA (K-DUR,KLOR-CON) 20 MEQ tablet 1 tablet 2 (two) times daily.    . Sodium Fluoride (CLINPRO 5000) 1.1 % PSTE Place 1 application onto teeth 3 (three) times daily.    Marland Kitchen spironolactone (ALDACTONE) 25 MG tablet 1 tablet daily.    . vitamin C (ASCORBIC ACID) 500 MG tablet Take 500 mg by mouth daily.     No current facility-administered medications for this visit.     SURGICAL HISTORY:  Past Surgical History:  Procedure Laterality Date  . ABDOMINAL HYSTERECTOMY    . BREAST BIOPSY     Right breast  . VIDEO BRONCHOSCOPY Bilateral 10/27/2013   Procedure: VIDEO BRONCHOSCOPY WITH FLUORO;  Surgeon: Collene Gobble, MD;  Location: [facility];  Service: Cardiopulmonary;  Laterality: Bilateral;    REVIEW OF SYSTEMS:  A comprehensive review of systems was negative except for: Respiratory: positive for cough  PHYSICAL EXAMINATION: General appearance: alert, cooperative and no distress Head: Normocephalic, without obvious abnormality, atraumatic Neck: no adenopathy, no JVD, supple, symmetrical, trachea midline and thyroid not enlarged, symmetric, no tenderness/mass/nodules Lymph nodes: Cervical, supraclavicular, and axillary nodes normal. Resp: clear to auscultation bilaterally Back: symmetric, no curvature. ROM normal. No CVA tenderness. Cardio: regular rate and rhythm, S1, S2 normal,  no murmur, click, rub or gallop GI: soft, non-tender; bowel sounds normal; no masses,  no organomegaly Extremities: extremities normal, atraumatic, no cyanosis or edema  ECOG PERFORMANCE STATUS: 0 - Asymptomatic  Blood pressure (!) 139/58, pulse 78, temperature 98.4 F (36.9 C), temperature source Oral, resp. rate 18, height '5\' 6"'$  (1.676 m), weight 210 lb 6.4 oz (95.4 kg), SpO2 100 %.  LABORATORY DATA: Lab Results  Component Value Date   WBC 7.2 10/15/2017   HGB 12.0 10/15/2017   HCT 36.3 10/15/2017   MCV 92.8 10/15/2017   PLT 140 (L) 10/15/2017      Chemistry      Component Value Date/Time   NA 140 09/12/2017 0910   NA 141 04/30/2017 1541   K 4.2 09/12/2017 0910   K 4.1 04/30/2017 1541   CL 108 09/12/2017 0910   CO2 26 09/12/2017 0910   CO2 26 04/30/2017 1541   BUN 14 09/12/2017 0910   BUN 14.1 04/30/2017 1541   CREATININE 0.79 09/12/2017 0910   CREATININE 0.8 04/30/2017 1541      Component Value Date/Time   CALCIUM 9.1 09/12/2017 0910   CALCIUM 9.6 04/30/2017 1541   ALKPHOS 97 09/12/2017 0910   ALKPHOS 105 04/30/2017 1541   AST 23 09/12/2017 0910   AST 40 (H) 04/30/2017 1541   ALT 18 09/12/2017 0910   ALT 27 04/30/2017 1541   BILITOT 0.4 09/12/2017 0910   BILITOT 0.55 04/30/2017 1541       RADIOGRAPHIC STUDIES: No results found.  ASSESSMENT AND PLAN:  This is a very pleasant 67 years old white female with a stage IV non-small cell lung cancer, adenocarcinoma with positive EGFR mutation in exon 21 (L858R) diagnosed in May 2015 status post treatment with Tarceva for 13 months discontinued secondary to disease progression and development of T790M resistant mutation. The patient is currently undergoing treatment with Tagrisso 80 mg by mouth daily status post 30 months. She continues to tolerate this treatment fairly well with no concerning complaints. I recommended for the patient to continue her current treatment with Tagrisso.  I will see her back for follow-up  visit in 1 month for evaluation after repeating CT scan of the chest, abdomen and pelvis for restaging of her disease. She was advised to call immediately if she has any concerning symptoms in the interval. The patient voices understanding of current disease status and treatment options and is in agreement with the current care plan. All questions were answered. The patient knows to call the clinic with any problems, questions or concerns. We can certainly see the patient much sooner if necessary.  Disclaimer: This note was dictated with voice recognition software. Similar sounding words can inadvertently be transcribed and may not be corrected upon review.

## 2017-10-17 ENCOUNTER — Other Ambulatory Visit: Payer: Self-pay | Admitting: *Deleted

## 2017-10-17 DIAGNOSIS — C3412 Malignant neoplasm of upper lobe, left bronchus or lung: Secondary | ICD-10-CM

## 2017-10-17 MED ORDER — OSIMERTINIB MESYLATE 80 MG PO TABS: 80.0000 mg | ORAL_TABLET | Freq: Every day | ORAL | 2 refills | Status: DC

## 2017-10-17 NOTE — Telephone Encounter (Signed)
Pt called for refill on Tagrisso. Faxed new Rx to [facility]

## 2017-11-08 ENCOUNTER — Inpatient Hospital Stay: Payer: Medicare Other | Attending: Internal Medicine

## 2017-11-08 ENCOUNTER — Ambulatory Visit (HOSPITAL_COMMUNITY)
Admission: RE | Admit: 2017-11-08 | Discharge: 2017-11-08 | Disposition: A | Discharge status: Home / Self Care | Payer: Medicare Other | Source: Ambulatory Visit | Attending: Internal Medicine | Admitting: Internal Medicine

## 2017-11-08 DIAGNOSIS — Z79899 Other long term (current) drug therapy: Secondary | ICD-10-CM | POA: Insufficient documentation

## 2017-11-08 DIAGNOSIS — K8020 Calculus of gallbladder without cholecystitis without obstruction: Secondary | ICD-10-CM | POA: Insufficient documentation

## 2017-11-08 DIAGNOSIS — K449 Diaphragmatic hernia without obstruction or gangrene: Secondary | ICD-10-CM | POA: Diagnosis not present

## 2017-11-08 DIAGNOSIS — I700 Atherosclerosis of aorta: Secondary | ICD-10-CM | POA: Insufficient documentation

## 2017-11-08 DIAGNOSIS — C787 Secondary malignant neoplasm of liver and intrahepatic bile duct: Secondary | ICD-10-CM | POA: Diagnosis not present

## 2017-11-08 DIAGNOSIS — C3490 Malignant neoplasm of unspecified part of unspecified bronchus or lung: Secondary | ICD-10-CM | POA: Diagnosis not present

## 2017-11-08 DIAGNOSIS — D1771 Benign lipomatous neoplasm of kidney: Secondary | ICD-10-CM | POA: Insufficient documentation

## 2017-11-08 DIAGNOSIS — K769 Liver disease, unspecified: Secondary | ICD-10-CM | POA: Insufficient documentation

## 2017-11-08 DIAGNOSIS — I10 Essential (primary) hypertension: Secondary | ICD-10-CM | POA: Insufficient documentation

## 2017-11-08 DIAGNOSIS — C3412 Malignant neoplasm of upper lobe, left bronchus or lung: Secondary | ICD-10-CM | POA: Diagnosis not present

## 2017-11-08 DIAGNOSIS — Z923 Personal history of irradiation: Secondary | ICD-10-CM | POA: Diagnosis not present

## 2017-11-08 DIAGNOSIS — C778 Secondary and unspecified malignant neoplasm of lymph nodes of multiple regions: Secondary | ICD-10-CM | POA: Insufficient documentation

## 2017-11-08 DIAGNOSIS — N200 Calculus of kidney: Secondary | ICD-10-CM | POA: Insufficient documentation

## 2017-11-08 LAB — CMP (CANCER CENTER ONLY)
ALBUMIN: 3.8 g/dL (ref 3.5–5.0)
ALT: 19 U/L (ref 0–55)
AST: 26 U/L (ref 5–34)
Alkaline Phosphatase: 115 U/L (ref 40–150)
Anion gap: 8 (ref 3–11)
BUN: 12 mg/dL (ref 7–26)
CHLORIDE: 105 mmol/L (ref 98–109)
CO2: 27 mmol/L (ref 22–29)
CREATININE: 0.78 mg/dL (ref 0.60–1.10)
Calcium: 9.5 mg/dL (ref 8.4–10.4)
GFR, Estimated: >60 mL/min (ref >60)
GLUCOSE: 74 mg/dL (ref 70–140)
Potassium: 4.4 mmol/L (ref 3.5–5.1)
SODIUM: 140 mmol/L (ref 136–145)
Total Bilirubin: 0.4 mg/dL (ref 0.2–1.2)
Total Protein: 7.5 g/dL (ref 6.4–8.3)

## 2017-11-08 LAB — CBC WITH DIFFERENTIAL (CANCER CENTER ONLY)
Basophils Absolute: 0 10*3/uL (ref 0.0–0.1)
Basophils Relative: 0 %
EOS ABS: 0.2 10*3/uL (ref 0.0–0.5)
Eosinophils Relative: 2 %
HEMATOCRIT: 35.6 % (ref 34.8–46.6)
HEMOGLOBIN: 11.9 g/dL (ref 11.6–15.9)
LYMPHS ABS: 1.9 10*3/uL (ref 0.9–3.3)
Lymphocytes Relative: 31 %
MCH: 30.9 pg (ref 25.1–34.0)
MCHC: 33.4 g/dL (ref 31.5–36.0)
MCV: 92.5 fL (ref 79.5–101.0)
MONO ABS: 0.6 10*3/uL (ref 0.1–0.9)
Monocytes Relative: 10 %
NEUTROS ABS: 3.6 10*3/uL (ref 1.5–6.5)
NEUTROS PCT: 57 %
Platelet Count: 144 10*3/uL — ABNORMAL LOW (ref 145–400)
RBC: 3.85 MIL/uL (ref 3.70–5.45)
RDW: 13.7 % (ref 11.2–14.5)
WBC Count: 6.2 10*3/uL (ref 3.9–10.3)

## 2017-11-08 MED ORDER — IOPAMIDOL (ISOVUE-300) INJECTION 61%
100.0000 mL | Freq: Once | INTRAVENOUS | Status: AC | PRN
  Administered 2017-11-08: 100 mL via INTRAVENOUS

## 2017-11-08 MED ORDER — IOPAMIDOL (ISOVUE-300) INJECTION 61%
INTRAVENOUS | Status: AC
  Filled 2017-11-08: qty 100

## 2017-11-09 ENCOUNTER — Inpatient Hospital Stay: Payer: Medicare Other

## 2017-11-12 ENCOUNTER — Inpatient Hospital Stay: Payer: Medicare Other

## 2017-11-12 ENCOUNTER — Telehealth: Payer: Self-pay | Admitting: Internal Medicine

## 2017-11-12 ENCOUNTER — Encounter: Payer: Self-pay | Admitting: *Deleted

## 2017-11-12 ENCOUNTER — Inpatient Hospital Stay (HOSPITAL_BASED_OUTPATIENT_CLINIC_OR_DEPARTMENT_OTHER): Payer: Medicare Other | Admitting: Internal Medicine

## 2017-11-12 ENCOUNTER — Encounter: Payer: Self-pay | Admitting: Internal Medicine

## 2017-11-12 ENCOUNTER — Encounter: Payer: Self-pay | Admitting: Radiation Oncology

## 2017-11-12 VITALS — BP 138/70 | HR 86 | Temp 97.9°F | Resp 20 | Ht 66.0 in | Wt 211.3 lb

## 2017-11-12 DIAGNOSIS — C3492 Malignant neoplasm of unspecified part of left bronchus or lung: Secondary | ICD-10-CM

## 2017-11-12 DIAGNOSIS — K449 Diaphragmatic hernia without obstruction or gangrene: Secondary | ICD-10-CM | POA: Diagnosis not present

## 2017-11-12 DIAGNOSIS — Z923 Personal history of irradiation: Secondary | ICD-10-CM | POA: Diagnosis not present

## 2017-11-12 DIAGNOSIS — K8020 Calculus of gallbladder without cholecystitis without obstruction: Secondary | ICD-10-CM | POA: Diagnosis not present

## 2017-11-12 DIAGNOSIS — N200 Calculus of kidney: Secondary | ICD-10-CM | POA: Diagnosis not present

## 2017-11-12 DIAGNOSIS — D1771 Benign lipomatous neoplasm of kidney: Secondary | ICD-10-CM | POA: Diagnosis not present

## 2017-11-12 DIAGNOSIS — I700 Atherosclerosis of aorta: Secondary | ICD-10-CM

## 2017-11-12 DIAGNOSIS — Z5111 Encounter for antineoplastic chemotherapy: Secondary | ICD-10-CM

## 2017-11-12 DIAGNOSIS — C787 Secondary malignant neoplasm of liver and intrahepatic bile duct: Secondary | ICD-10-CM | POA: Diagnosis not present

## 2017-11-12 DIAGNOSIS — C3412 Malignant neoplasm of upper lobe, left bronchus or lung: Secondary | ICD-10-CM | POA: Diagnosis not present

## 2017-11-12 DIAGNOSIS — C778 Secondary and unspecified malignant neoplasm of lymph nodes of multiple regions: Secondary | ICD-10-CM

## 2017-11-12 DIAGNOSIS — I10 Essential (primary) hypertension: Secondary | ICD-10-CM | POA: Diagnosis not present

## 2017-11-12 DIAGNOSIS — Z79899 Other long term (current) drug therapy: Secondary | ICD-10-CM | POA: Diagnosis not present

## 2017-11-12 LAB — CBC WITH DIFFERENTIAL (CANCER CENTER ONLY)
Basophils Absolute: 0 10*3/uL (ref 0.0–0.1)
Basophils Relative: 0 %
Eosinophils Absolute: 0.2 10*3/uL (ref 0.0–0.5)
Eosinophils Relative: 3 %
HEMATOCRIT: 36.2 % (ref 34.8–46.6)
HEMOGLOBIN: 12 g/dL (ref 11.6–15.9)
LYMPHS ABS: 1.5 10*3/uL (ref 0.9–3.3)
Lymphocytes Relative: 21 %
MCH: 30.7 pg (ref 25.1–34.0)
MCHC: 33.1 g/dL (ref 31.5–36.0)
MCV: 92.6 fL (ref 79.5–101.0)
MONOS PCT: 9 %
Monocytes Absolute: 0.6 10*3/uL (ref 0.1–0.9)
NEUTROS ABS: 4.7 10*3/uL (ref 1.5–6.5)
NEUTROS PCT: 67 %
Platelet Count: 147 10*3/uL (ref 145–400)
RBC: 3.91 MIL/uL (ref 3.70–5.45)
RDW: 13.8 % (ref 11.2–14.5)
WBC Count: 6.9 10*3/uL (ref 3.9–10.3)

## 2017-11-12 LAB — CMP (CANCER CENTER ONLY)
ALT: 17 U/L (ref 0–55)
ANION GAP: 8 (ref 3–11)
AST: 23 U/L (ref 5–34)
Albumin: 3.7 g/dL (ref 3.5–5.0)
Alkaline Phosphatase: 112 U/L (ref 40–150)
BUN: 14 mg/dL (ref 7–26)
CHLORIDE: 107 mmol/L (ref 98–109)
CO2: 25 mmol/L (ref 22–29)
Calcium: 8.9 mg/dL (ref 8.4–10.4)
Creatinine: 0.79 mg/dL (ref 0.60–1.10)
GFR, Estimated: >60 mL/min (ref >60)
Glucose, Bld: 86 mg/dL (ref 70–140)
Potassium: 3.9 mmol/L (ref 3.5–5.1)
SODIUM: 140 mmol/L (ref 136–145)
Total Bilirubin: 0.4 mg/dL (ref 0.2–1.2)
Total Protein: 7.3 g/dL (ref 6.4–8.3)

## 2017-11-12 NOTE — Progress Notes (Signed)
[facility] [phone]   [phone]) [phone]  OFFICE PROGRESS NOTE  System, Pcp Not In No address on file  DIAGNOSIS: Stage IV (T1b, N3, M1b) non-small cell lung cancer, adenocarcinoma with positive EGFR mutation in exon 21 (L858R) and recently found to have T790M resistant mutation, presented with left upper lobe lung mass in addition to left hilar and bilateral mediastinal lymphadenopathy as well as liver metastasis diagnosed in May of 2015.  PRIOR THERAPY: 1) Tarceva 150 mg by mouth daily. Started 11/25/2013. Status post 5 months of treatment discontinued today secondary to intolerance with persistent paronychia. 2) Tarceva 100 mg by mouth daily. Started 06/03/2014. Status post 8 months of treatment.  3) stereotactic radiotherapy to liver lesions under the care of Dr. Tammi Klippel. Last dose 03/02/2017  CURRENT THERAPY: Tagrisso 80 mg by mouth daily status post 31 months of treatment. First dose was given 03/04/2015.  INTERVAL HISTORY: Debra Hodges 67 y.o. female returns to the clinic today for follow-up visit accompanied by her husband.  The patient continues to complain of shortness of breath with exertion as well as wheezing.  She denied having any chest pain but continues to have cough with no hemoptysis.  She has no recent weight loss or night sweats.  She has no nausea, vomiting, diarrhea or constipation.  She denied having any fever or chills.  She has been tolerating her treatment with Tagrisso fairly well.  She had repeat CT scan of the chest, abdomen and pelvis performed recently and she is here for evaluation and discussion of her risk her results.   MEDICAL HISTORY: Past Medical History:  Diagnosis Date  . High blood pressure   . Liver lesion 06/26/2016  . Non-small cell carcinoma of lung, stage 4 ([place]) dx'd 10/2013    ALLERGIES:  is allergic to penicillins.  MEDICATIONS:  Current Outpatient Medications  Medication Sig Dispense Refill  .  acetaminophen (TYLENOL) 325 MG tablet Take 650 mg by mouth every 6 (six) hours as needed. Reported on 06/15/2015    . calcium carbonate (OS-CAL) 600 MG TABS tablet Take 600 mg by mouth daily.     Marland Kitchen CLIMARA 0.1 MG/24HR patch 1 patch once a week.    Marland Kitchen guaifenesin (ROBITUSSIN) 100 MG/5ML syrup Take 200 mg by mouth 3 (three) times daily as needed for cough.    . levothyroxine (SYNTHROID, LEVOTHROID) 50 MCG tablet Take 50 mcg by mouth daily.    Marland Kitchen loperamide (IMODIUM) 2 MG capsule Take by mouth as needed for diarrhea or loose stools. Reported on 11/22/2015    . metroNIDAZOLE (METROCREAM) 0.75 % cream Apply 1 application topically daily. Reported on 11/22/2015    . Multiple Vitamins-Calcium (ONE-A-DAY WOMENS PO) Take 1 tablet by mouth daily.    Marland Kitchen osimertinib mesylate (TAGRISSO) 80 MG tablet Take 1 tablet (80 mg total) by mouth daily. 30 tablet 2  . potassium chloride SA (K-DUR,KLOR-CON) 20 MEQ tablet 1 tablet 2 (two) times daily.    . Sodium Fluoride (CLINPRO 5000) 1.1 % PSTE Place 1 application onto teeth 3 (three) times daily.    Marland Kitchen spironolactone (ALDACTONE) 25 MG tablet 1 tablet daily.    . vitamin C (ASCORBIC ACID) 500 MG tablet Take 500 mg by mouth daily.     No current facility-administered medications for this visit.     SURGICAL HISTORY:  Past Surgical History:  Procedure Laterality Date  . ABDOMINAL HYSTERECTOMY    . BREAST BIOPSY     Right breast  .  VIDEO BRONCHOSCOPY Bilateral 10/27/2013   Procedure: VIDEO BRONCHOSCOPY WITH FLUORO;  Surgeon: Collene Gobble, MD;  Location: [facility];  Service: Cardiopulmonary;  Laterality: Bilateral;    REVIEW OF SYSTEMS:  Constitutional: positive for fatigue Eyes: negative Ears, nose, mouth, throat, and face: negative Respiratory: positive for cough, dyspnea on exertion and wheezing Cardiovascular: negative Gastrointestinal: negative Genitourinary:negative Integument/breast: negative Hematologic/lymphatic:  negative Musculoskeletal:negative Neurological: negative Behavioral/Psych: negative Endocrine: negative Allergic/Immunologic: negative   PHYSICAL EXAMINATION: General appearance: alert, cooperative, fatigued and no distress Head: Normocephalic, without obvious abnormality, atraumatic Neck: no adenopathy, no JVD, supple, symmetrical, trachea midline and thyroid not enlarged, symmetric, no tenderness/mass/nodules Lymph nodes: Cervical, supraclavicular, and axillary nodes normal. Resp: wheezes LUL Back: symmetric, no curvature. ROM normal. No CVA tenderness. Cardio: regular rate and rhythm, S1, S2 normal, no murmur, click, rub or gallop GI: soft, non-tender; bowel sounds normal; no masses,  no organomegaly Extremities: extremities normal, atraumatic, no cyanosis or edema Neurologic: Alert and oriented X 3, normal strength and tone. Normal symmetric reflexes. Normal coordination and gait  ECOG PERFORMANCE STATUS: 1 - Symptomatic but completely ambulatory  Blood pressure 138/70, pulse 86, temperature 97.9 F (36.6 C), temperature source Oral, resp. rate 20, height 5' 6" (1.676 m), weight 211 lb 4.8 oz (95.8 kg), SpO2 100 %.  LABORATORY DATA: Lab Results  Component Value Date   WBC 6.2 11/08/2017   HGB 11.9 11/08/2017   HCT 35.6 11/08/2017   MCV 92.5 11/08/2017   PLT 144 (L) 11/08/2017      Chemistry      Component Value Date/Time   NA 140 11/08/2017 1341   NA 141 04/30/2017 1541   K 4.4 11/08/2017 1341   K 4.1 04/30/2017 1541   CL 105 11/08/2017 1341   CO2 27 11/08/2017 1341   CO2 26 04/30/2017 1541   BUN 12 11/08/2017 1341   BUN 14.1 04/30/2017 1541   CREATININE 0.78 11/08/2017 1341   CREATININE 0.8 04/30/2017 1541      Component Value Date/Time   CALCIUM 9.5 11/08/2017 1341   CALCIUM 9.6 04/30/2017 1541   ALKPHOS 115 11/08/2017 1341   ALKPHOS 105 04/30/2017 1541   AST 26 11/08/2017 1341   AST 40 (H) 04/30/2017 1541   ALT 19 11/08/2017 1341   ALT 27 04/30/2017 1541    BILITOT 0.4 11/08/2017 1341   BILITOT 0.55 04/30/2017 1541       RADIOGRAPHIC STUDIES: Ct Chest W Contrast  Result Date: 11/08/2017 CLINICAL DATA:  Metastatic non-small cell lung cancer restaging EXAM: CT CHEST, ABDOMEN, AND PELVIS WITH CONTRAST TECHNIQUE: Multidetector CT imaging of the chest, abdomen and pelvis was performed following the standard protocol during bolus administration of intravenous contrast. CONTRAST:  146m ISOVUE-300 IOPAMIDOL (ISOVUE-300) INJECTION 61% COMPARISON:  Multiple exams, including 08/07/2017 FINDINGS: CT CHEST FINDINGS Cardiovascular: Atherosclerotic calcification of the aortic arch. Mediastinum/Nodes: Small calcified right anterior thyroid nodule. Small bilateral axillary lymph nodes. Left paraesophageal lymph node 0.8 cm in short axis on image 32/2, previously 0.6 cm. Small type 1 hiatal hernia. Lungs/Pleura: The left upper lobe suprahilar mass measures 5.9 by 3.9 cm on image 40/6, formerly 1.4 by 1.9 cm. The mass now completely occludes the apicoposterior segment bronchus in the left upper lobe. There is prominent narrowing of the remaining left upper lobe tracheobronchial tree by surrounding tumor, including the lingular segmental branches. Abnormal masslike density along the lingula is probably best matter measured transversely and measures 2.0 cm today, previously 1.9 cm. There is new nodularity in the lingula which  could be from tumor nodules or postobstructive pneumonitis. Some of the lingular tumor abuts the major fissure. Stable 6 by 4 mm left lower lobe pulmonary nodule on image 81/6. Stable 3 mm sub solid nodule in the right upper lobe on image 38/6. New 2 mm right lower lobe nodule on image 78/6. Musculoskeletal: Thoracic spondylosis. CT ABDOMEN PELVIS FINDINGS Hepatobiliary: Segment 4a lesion in the liver measures 1.6 by 0.8 cm, formerly 1.3 by 0.8 cm. A new hypoenhancing lesion in segment 2 of the liver measures 1.4 by 1.3 cm on image 44/2. A new lesion in  segment 3 of the liver measures 1.0 by 1.0 cm on image 56/2. Questionable hypodense lesion in the dome of the right hepatic lobe on image 47/2. Gallstones in the gallbladder measure up to 6 mm in diameter. Pancreas: Unremarkable Spleen: Hypodense lesion along the inferior splenic hilar parenchyma measures 1.0 by 0.8 cm, stable from prior exam. 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 0.6 cm in long axis left kidney lower pole nonobstructive renal calculus. 4 by 2 mm left mid to lower pole nonobstructive renal calculus. Tiny hypodense lesion in the cortex of the right kidney upper pole on image 72/4 is technically nonspecific due to small size. Stomach/Bowel: Fullness along the ileocecal valve probably from stool material, less likely to be from a mass given the lack of finding in this vicinity 3 months ago. Vascular/Lymphatic: Aortoiliac atherosclerotic vascular disease. Reproductive: Uterus absent.  Adnexa unremarkable. Other: No supplemental non-categorized findings. Musculoskeletal: 4 mm grade 1 degenerative anterolisthesis at L4-5. Lower lumbar degenerative facet arthropathy. IMPRESSION: 1. Enlarging left upper lobe suprahilar mass with increased bronchial occlusion. New nodularity in the lingula potentially from tumor nodules are postobstructive pneumonitis. New 2 mm right lower lobe nodule. 2. There are at least 2 new hypoenhancing liver lesions compatible with metastatic disease. The previous existing lesion in segment 4a of the liver is mildly increased in size compared to prior. 3. Other imaging findings of potential clinical significance: Aortic Atherosclerosis (ICD10-I70.0). Small type 1 hiatal hernia. Cholelithiasis. Probable cyst medially in the spleen. 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 Nonobstructive left nephrolithiasis. Electronically Signed   By: Van Clines M.D.   On: 11/08/2017 15:20   Ct Abdomen Pelvis W  Contrast  Result Date: 11/08/2017 CLINICAL DATA:  Metastatic non-small cell lung cancer restaging EXAM: CT CHEST, ABDOMEN, AND PELVIS WITH CONTRAST TECHNIQUE: Multidetector CT imaging of the chest, abdomen and pelvis was performed following the standard protocol during bolus administration of intravenous contrast. CONTRAST:  127m ISOVUE-300 IOPAMIDOL (ISOVUE-300) INJECTION 61% COMPARISON:  Multiple exams, including 08/07/2017 FINDINGS: CT CHEST FINDINGS Cardiovascular: Atherosclerotic calcification of the aortic arch. Mediastinum/Nodes: Small calcified right anterior thyroid nodule. Small bilateral axillary lymph nodes. Left paraesophageal lymph node 0.8 cm in short axis on image 32/2, previously 0.6 cm. Small type 1 hiatal hernia. Lungs/Pleura: The left upper lobe suprahilar mass measures 5.9 by 3.9 cm on image 40/6, formerly 1.4 by 1.9 cm. The mass now completely occludes the apicoposterior segment bronchus in the left upper lobe. There is prominent narrowing of the remaining left upper lobe tracheobronchial tree by surrounding tumor, including the lingular segmental branches. Abnormal masslike density along the lingula is probably best matter measured transversely and measures 2.0 cm today, previously 1.9 cm. There is new nodularity in the lingula which could be from tumor nodules or postobstructive pneumonitis. Some of the lingular tumor abuts the major fissure. Stable 6  by 4 mm left lower lobe pulmonary nodule on image 81/6. Stable 3 mm sub solid nodule in the right upper lobe on image 38/6. New 2 mm right lower lobe nodule on image 78/6. Musculoskeletal: Thoracic spondylosis. CT ABDOMEN PELVIS FINDINGS Hepatobiliary: Segment 4a lesion in the liver measures 1.6 by 0.8 cm, formerly 1.3 by 0.8 cm. A new hypoenhancing lesion in segment 2 of the liver measures 1.4 by 1.3 cm on image 44/2. A new lesion in segment 3 of the liver measures 1.0 by 1.0 cm on image 56/2. Questionable hypodense lesion in the dome of the  right hepatic lobe on image 47/2. Gallstones in the gallbladder measure up to 6 mm in diameter. Pancreas: Unremarkable Spleen: Hypodense lesion along the inferior splenic hilar parenchyma measures 1.0 by 0.8 cm, stable from prior exam. 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 0.6 cm in long axis left kidney lower pole nonobstructive renal calculus. 4 by 2 mm left mid to lower pole nonobstructive renal calculus. Tiny hypodense lesion in the cortex of the right kidney upper pole on image 72/4 is technically nonspecific due to small size. Stomach/Bowel: Fullness along the ileocecal valve probably from stool material, less likely to be from a mass given the lack of finding in this vicinity 3 months ago. Vascular/Lymphatic: Aortoiliac atherosclerotic vascular disease. Reproductive: Uterus absent.  Adnexa unremarkable. Other: No supplemental non-categorized findings. Musculoskeletal: 4 mm grade 1 degenerative anterolisthesis at L4-5. Lower lumbar degenerative facet arthropathy. IMPRESSION: 1. Enlarging left upper lobe suprahilar mass with increased bronchial occlusion. New nodularity in the lingula potentially from tumor nodules are postobstructive pneumonitis. New 2 mm right lower lobe nodule. 2. There are at least 2 new hypoenhancing liver lesions compatible with metastatic disease. The previous existing lesion in segment 4a of the liver is mildly increased in size compared to prior. 3. Other imaging findings of potential clinical significance: Aortic Atherosclerosis (ICD10-I70.0). Small type 1 hiatal hernia. Cholelithiasis. Probable cyst medially in the spleen. 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 Nonobstructive left nephrolithiasis. Electronically Signed   By: Van Clines M.D.   On: 11/08/2017 15:20    ASSESSMENT AND PLAN:  This is a very pleasant 67 years old white female with a stage IV non-small cell lung cancer, adenocarcinoma with  positive EGFR mutation in exon 21 (L858R) diagnosed in May 2015 status post treatment with Tarceva for 13 months discontinued secondary to disease progression and development of T790M resistant mutation. The patient is currently undergoing treatment with Tagrisso 80 mg by mouth daily status post 31 months. The patient has been tolerating this treatment well with no concerning complaints. Repeat CT scan of the chest, abdomen and pelvis was performed recently.  I personally and independently reviewed the scan images and discussed the results with the patient and her husband.  Unfortunately her scan showed evidence for disease progression with enlarging left upper lobe suprahilar mass with increased bronchial occlusion as well as new pulmonary nodules in the right lower lobe and progressive liver disease. I recommend for the patient to have repeat blood test with Guardant 360 to rule out the development of any other new mutations.  If the patient has no new actionable mutations, I would consider her for treatment with systemic chemotherapy with carboplatin, Alimta and Avastin. I also referred the patient to Dr. Tammi Klippel from radiation oncology for consideration of palliative radiotherapy to the enlarging left upper lobe suprahilar mass to avoid further obstruction of  her bronchial tree. I will see her back for follow-up visit in 2 weeks for reevaluation and more detailed discussion of her treatment options based on the molecular studies. She was advised to call immediately if she has any concerning symptoms in the interval. The patient voices understanding of current disease status and treatment options and is in agreement with the current care plan. All questions were answered. The patient knows to call the clinic with any problems, questions or concerns. We can certainly see the patient much sooner if necessary.  Disclaimer: This note was dictated with voice recognition software. Similar sounding words can  inadvertently be transcribed and may not be corrected upon review.

## 2017-11-12 NOTE — Progress Notes (Signed)
Thoracic Location of Tumor / Histology: Stage IV (T1b, N3, M1b) non-small cell lung cancer, adenocarcinoma with positive EGFR mutation in exon 21 (L858R) and recently found to have T790M resistant mutation, presented with left upper lobe lung mass in addition to left hilar and bilateral mediastinal lymphadenopathy as well as liver metastasis diagnosed in May of 2015.  Recentscan showed evidence for disease progression with enlarging left upper lobe suprahilar mass with increased bronchial occlusion as well as new pulmonary nodules in the right lower lobe and progressive liver disease.  Patient presented  months ago with symptoms of:  Continues to complain of shortness of breath with exertion as well as wheezing.  Cough with no hemoptysis.   Biopsies of  (if applicable) revealed:  Diagnosis 10-27-13 1. Lung, transbronchial biopsy, LUL - ADENOCARCINOMA. 2. Endobronchial biopsy, LUL - ADENOCARCINOMA    Tobacco/Marijuana/Snuff/ETOH use: Never a smoker, No alcohol usage or drug usuage  Past/Anticipated interventions by cardiothoracic surgery, if any: Dr. Court Joy ,Dr. Lanelle Bal  IMPRESSION: 11-11-13 PET Dr. Lanelle Bal  1. Large lingular mass is markedly hypermetabolic and consistent  with lung cancer. There are hypermetabolic left hilar nodes and  contralateral paratracheal adenopathy.  2. Small scattered pulmonary nodules are suspicious for pulmonary  metastatic disease.  3. Hepatic metastatic disease   IMPRESSION: 11-06-13 MRI brain Dr. Lanelle Bal  No acute or metastatic intracranial abnormality. Normal for age MRI appearance of the brain   Dr. Court Joy 10-27-13 VIDEO BRONCHOSCOPY WITH FLUORO   IMPRESSION:10-27-13 Dr. Court Joy 1. No pneumothorax after bronchoscopic biopsy. 2. Masslike opacity in the lingula, as before, worrisome for primary bronchogenic carcinoma.   Past/Anticipated interventions by medical oncology, if any: Dr. Julien Nordmann    11-12-17 Guardant 360  IMPRESSION:06-13- CT Chest w contrast 1. Enlarging left upper lobe suprahilar mass with increased bronchial occlusion. New nodularity in the lingula potentially from tumor nodules are postobstructive pneumonitis. New 2 mm right lower lobe nodule. 2. There are at least 2 new hypoenhancing liver lesions compatible with metastatic disease. The previous existing lesion in segment 4a of the liver is mildly increased in size compared to prior. 3. Other imaging findings of potential clinical significance: Aortic Atherosclerosis (ICD10-I70.0). Small type 1 hiatal hernia. Cholelithiasis. Probable cyst medially in the spleen. 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 Nonobstructive left nephrolithiasis. Referred the patient to Dr. Tammi Klippel from radiation oncology for consideration of palliative radiotherapy to the enlarging left upper lobe suprahilar mass to avoid further obstruction of her bronchial tree.   IMPRESSION: 11-08-17 CT Abdomen Pelvis w contrast  1. Enlarging left upper lobe suprahilar mass with increased bronchial occlusion. New nodularity in the lingula potentially from tumor nodules are postobstructive pneumonitis. New 2 mm right lower lobe nodule. 2. There are at least 2 new hypoenhancing liver lesions compatible with metastatic disease. The previous existing lesion in segment 4a of the liver is mildly increased in size compared to prior. 3. Other imaging findings of potential clinical significance: Aortic Atherosclerosis (ICD10-I70.0). Small type 1 hiatal hernia. Cholelithiasis. Probable cyst medially in the spleen. 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 Nonobstructive left nephrolithiasis.   I recommend for the patient to have repeat blood test with Guardant 360 to rule out the development of any other new mutations.  If the patient has no new actionable mutations, I would consider her for treatment  with systemic chemotherapy with carboplatin, Alimta and Avastin.   Current therapy Tagrisso 80 mg by  mouth daily status post 31 months of treatment. First dose was given 03/04/2015  PRIOR THERAPY: 1) Tarceva 150 mg by mouth daily. Started 11/25/2013. Status post 5 months of treatment discontinued today secondary to intolerance with persistent paronychia. 2) Tarceva 100 mg by mouth daily. Started 06/03/2014. Status post 8 months of treatment.  3) stereotactic radiotherapy to liver lesions under the care of Dr. Tammi Klippel. Last dose 03/02/2017   Signs/Symptoms Weight changes, if any: Wt Readings from Last 3 Encounters:  11/14/17 211 lb (95.7 kg)  11/12/17 211 lb 4.8 oz (95.8 kg)  10/15/17 210 lb 6.4 oz (95.4 kg)     Respiratory complaints, if any: SOB in the when she first gets up in the  Mornings and with exertion, dry cough unable to get the secretion up and clear mucous other times, wheezing;having nausea and vomiting at night and in the mornings from excessive mucous  Hemoptysis, if any:No  Pain issues, if any: No  SAFETY ISSUES:  Prior radiation? 02-21-17  03-02-17 Liver  Pacemaker/ICD? No  Possible current pregnancy? No  Is the patient on methotrexate? No  Current Complaints / other details: Referred the patient to Dr. Tammi Klippel from radiation oncology for consideration of palliative radiotherapy to the enlarging left upper lobe suprahilar mass to avoid further obstruction of her bronchial tree.  Father had radiation for esophageal cancer BP (!) 129/59 (BP Location: Left Arm, Patient Position: Sitting, Cuff Size: Large)   Pulse 82   Temp 98 F (36.7 C) (Oral)   Resp 18   Ht '5\' 6"'$  (1.676 m)   Wt 211 lb (95.7 kg)   SpO2 100%   BMI 34.06 kg/m

## 2017-11-12 NOTE — Telephone Encounter (Signed)
Scheduled appt per 6/17 los - gave patient AVS and calender per los. - Rad onc to contact patient.

## 2017-11-12 NOTE — Progress Notes (Signed)
Oncology Nurse Navigator Documentation  Oncology Nurse Navigator Flowsheets 11/12/2017  Navigator Location [facility]  Navigator Encounter Type Clinic/MDC/patient has disease progression.  Completed Guardant 360 lab and explained to patient.   Patient Visit Type MedOnc  Treatment Phase Treatment  Barriers/Navigation Needs Education  Education Other  Interventions Coordination of Care;Education  Coordination of Care Other  Education Method Verbal  Acuity Level 2  Acuity Level 2 Educational needs;Other  Time Spent with Patient 15

## 2017-11-14 ENCOUNTER — Ambulatory Visit
Admission: RE | Admit: 2017-11-14 | Discharge: 2017-11-14 | Disposition: A | Discharge status: Home / Self Care | Payer: Medicare Other | Source: Ambulatory Visit | Attending: Radiation Oncology | Admitting: Radiation Oncology

## 2017-11-14 ENCOUNTER — Other Ambulatory Visit: Payer: Self-pay

## 2017-11-14 ENCOUNTER — Encounter: Payer: Self-pay | Admitting: Urology

## 2017-11-14 ENCOUNTER — Ambulatory Visit
Admission: RE | Admit: 2017-11-14 | Discharge: 2017-11-14 | Disposition: A | Discharge status: Home / Self Care | Payer: Medicare Other | Source: Ambulatory Visit | Attending: Urology | Admitting: Urology

## 2017-11-14 VITALS — BP 129/59 | HR 82 | Temp 98.0°F | Resp 18 | Ht 66.0 in | Wt 211.0 lb

## 2017-11-14 DIAGNOSIS — Z79899 Other long term (current) drug therapy: Secondary | ICD-10-CM | POA: Insufficient documentation

## 2017-11-14 DIAGNOSIS — C3492 Malignant neoplasm of unspecified part of left bronchus or lung: Secondary | ICD-10-CM | POA: Insufficient documentation

## 2017-11-14 DIAGNOSIS — C3412 Malignant neoplasm of upper lobe, left bronchus or lung: Secondary | ICD-10-CM | POA: Insufficient documentation

## 2017-11-14 DIAGNOSIS — Z880 Allergy status to penicillin: Secondary | ICD-10-CM | POA: Diagnosis not present

## 2017-11-14 DIAGNOSIS — C787 Secondary malignant neoplasm of liver and intrahepatic bile duct: Secondary | ICD-10-CM | POA: Diagnosis not present

## 2017-11-14 DIAGNOSIS — Z510 Encounter for antineoplastic radiation therapy: Secondary | ICD-10-CM | POA: Diagnosis not present

## 2017-11-14 DIAGNOSIS — Z79890 Hormone replacement therapy (postmenopausal): Secondary | ICD-10-CM | POA: Diagnosis not present

## 2017-11-14 DIAGNOSIS — R590 Localized enlarged lymph nodes: Secondary | ICD-10-CM | POA: Diagnosis not present

## 2017-11-14 DIAGNOSIS — Z9889 Other specified postprocedural states: Secondary | ICD-10-CM | POA: Insufficient documentation

## 2017-11-14 DIAGNOSIS — J984 Other disorders of lung: Secondary | ICD-10-CM | POA: Diagnosis not present

## 2017-11-14 DIAGNOSIS — Z923 Personal history of irradiation: Secondary | ICD-10-CM | POA: Diagnosis not present

## 2017-11-14 DIAGNOSIS — Z90710 Acquired absence of both cervix and uterus: Secondary | ICD-10-CM | POA: Diagnosis not present

## 2017-11-14 NOTE — Progress Notes (Signed)
  Radiation Oncology         (336) [phone] ________________________________  Name: Debra Hodges MRN: [id number]  Date: 11/14/2017  DOB: 07-05-1950  SIMULATION AND TREATMENT PLANNING NOTE    ICD-10-CM   1. 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  NARRATIVE:  The patient was brought to the [facility].  Identity was confirmed.  All relevant records and images related to the planned course of therapy were reviewed.  The patient freely provided informed written consent to proceed with treatment after reviewing the details related to the planned course of therapy. The consent form was witnessed and verified by the simulation staff.  Then, the patient was set-up in a stable reproducible  supine position for radiation therapy.  CT images were obtained.  Surface markings were placed.  The CT images were loaded into the planning software.  Then the target and avoidance structures were contoured.  Treatment planning then occurred.  The radiation prescription was entered and confirmed.  Then, I designed and supervised the construction of a total of 6 medically necessary complex treatment devices, including a BodyFix immobilization mold custom fitted to the patient along with 5 multileaf collimators conformally shaped radiation around the treatment target while shielding critical structures such as the heart and spinal cord maximally.  I have requested : 3D Simulation  I have requested a DVH of the following structures: Left lung, right lung, spinal cord, heart, esophagus, and target.  I have ordered:Nutrition Consult  PLAN:  The patient will receive 30 Gy in 10 fractions.  ________________________________  Sheral Apley Tammi Klippel, M.D.  This document serves as a record of services personally performed by Tyler Pita,  MD. It was created on his behalf by Bethann Humble, a trained medical scribe. The creation of this record is based on the scribe's personal observations and the provider's statements to them. This document has been checked and approved by the attending provider.

## 2017-11-14 NOTE — Progress Notes (Addendum)
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    ICD-10-CM   1. Non-small cell carcinoma of left lung, stage 4 (HCC) C34.92     HISTORY OF PRESENT ILLNESS: Debra Hodges is a 67 y.o. female seen at the request of Dr. Julien Nordmann. She was originally diagnosed with Stage IV NSCLC, adenocarcinoma of the left upper lobe with left hilar and bilateral mediastinal lymphadenopathy as well as liver metastasis in May 2015. She received Tarceva 150 mg by mouth daily starting 11/25/2013, status post 5 months of treatment, discontinued secondary to intolerance with persistent paronychia. 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 She was switched to Tagrisso 80 mg by mouth daily with first dose given 03/04/2015, status post 31 months of treatment and had been tolerating this treatment very well.   Disease restaging CT C/A/P on 01/20/2017 showed interval progression of the central right liver metastatic lesion with progression of a second lesion in the lateral segment left liver. Of note, stable appearance of irregular posterior left upper lobe lesion with tiny bilateral pulmonary nodules unchanged in the interval. She elected to proceed with SBRT treatment to the two isolated liver metastases, delivered over the course of 5 treatments and completed on 03/02/2017.  She tolerated treatment well and continued on Tagrisso for systemic disease management under the care and direction of Dr. Julien Nordmann.  She has  noticed progressive SOB with exertion over the past couple months with increased cough.  She denies hemoptysis and reports occasional clear/white sputum.  She has not had recent chest pain, fever, chills or night sweats and denies unintentional weight loss.  Unfortunately, her recent restaging CT C/A/P on 11/08/17 shows disease progression with the left upper lobe suprahilar mass measuring 5.9 x 3.9 cm, formerly 1.4 x 1.9 cm and now completely occluding the apicoposterior segment bronchus in the left upper lobe and prominent narrowing of the remaining left upper lobe tracheobronchial tree including the lingular segmental branches.  There are additional small, stable pulmonary nodules bilaterally and at least 2 new hypoenhancing liver lesions compatible with metastatic disease and interval increase in size of the previously treated segment 4a liver lesion.  She has met with Dr. Earlie Server and the plan is to repeat her Guardant 360 blood test to rule out the development of any new mutations to help guide further systemic treatment options.  If the patient has no new actionable mutations, Dr. Julien Nordmann will consider treatment with systemic chemotherapy with carboplatin, Alimta and Avastin.  The patient is here for further evaluation and discussion of palliative radiation treatment options in the management of the enlarging, obstructing LUL lesion.  PREVIOUS RADIATION THERAPY: Yes-  02/21/2017 - 03/02/2017:  Two isolated liver mets were treated to 50 Gy in 5 fractions of 10 Gy.  PAST MEDICAL HISTORY:  Past Medical History:  Diagnosis Date  . High blood pressure   . Liver lesion 06/26/2016  . Non-small cell carcinoma of lung, stage 4 ([place]) dx'd 10/2013      PAST SURGICAL HISTORY: Past Surgical History:  Procedure Laterality  Date  . ABDOMINAL HYSTERECTOMY    . BREAST BIOPSY     Right breast  . VIDEO BRONCHOSCOPY Bilateral 10/27/2013   Procedure: VIDEO BRONCHOSCOPY WITH FLUORO;  Surgeon: Collene Gobble, MD;   Location: [facility];  Service: Cardiopulmonary;  Laterality: Bilateral;    FAMILY HISTORY:  Family History  Problem Relation Age of Onset  . High blood pressure Mother   . Cancer Mother        skin  . Cancer Father        esophageal  . Cancer Maternal Grandfather        breast    SOCIAL HISTORY: She continues to work full time in [employer]. Social History   Socioeconomic History  . Marital status: Married    Spouse name: Not on file  . Number of children: Not on file  . Years of education: Not on file  . Highest education level: Not on file  Occupational History  . Occupation: Optometrist  Social Needs  . Financial resource strain: Not on file  . Food insecurity:    Worry: Not on file    Inability: Not on file  . Transportation needs:    Medical: Not on file    Non-medical: Not on file  Tobacco Use  . Smoking status: Never Smoker  . Smokeless tobacco: Never Used  Substance and Sexual Activity  . Alcohol use: No    Comment: social  . Drug use: No  . Sexual activity: Not Currently  Lifestyle  . Physical activity:    Days per week: Not on file    Minutes per session: Not on file  . Stress: Not on file  Relationships  . Social connections:    Talks on phone: Not on file    Gets together: Not on file    Attends religious service: Not on file    Active member of club or organization: Not on file    Attends meetings of clubs or organizations: Not on file    Relationship status: Not on file  . Intimate partner violence:    Fear of current or ex partner: Not on file    Emotionally abused: Not on file    Physically abused: Not on file    Forced sexual activity: Not on file  Other Topics Concern  . Not on file  Social History Narrative  . Not on file    ALLERGIES: Penicillins  MEDICATIONS:  Current Outpatient Medications  Medication Sig Dispense Refill  . acetaminophen (TYLENOL) 325 MG tablet Take 650 mg by mouth every 6 (six) hours as needed. Reported  on 06/15/2015    . calcium carbonate (OS-CAL) 600 MG TABS tablet Take 600 mg by mouth daily.     Marland Kitchen CLIMARA 0.1 MG/24HR patch 1 patch once a week.    . levothyroxine (SYNTHROID, LEVOTHROID) 50 MCG tablet Take 50 mcg by mouth daily.    Marland Kitchen loperamide (IMODIUM) 2 MG capsule Take by mouth as needed for diarrhea or loose stools. Reported on 11/22/2015    . Multiple Vitamins-Calcium (ONE-A-DAY WOMENS PO) Take 1 tablet by mouth daily.    Marland Kitchen osimertinib mesylate (TAGRISSO) 80 MG tablet Take 1 tablet (80 mg total) by mouth daily. 30 tablet 2  . potassium chloride SA (K-DUR,KLOR-CON) 20 MEQ tablet 1 tablet 2 (two) times daily.    . Sodium Fluoride (CLINPRO 5000) 1.1 % PSTE Place 1 application onto teeth 3 (three) times daily.    Marland Kitchen spironolactone (ALDACTONE) 25 MG tablet 1 tablet  daily.    . vitamin C (ASCORBIC ACID) 500 MG tablet Take 500 mg by mouth daily.    Marland Kitchen guaifenesin (ROBITUSSIN) 100 MG/5ML syrup Take 200 mg by mouth 3 (three) times daily as needed for cough.    . metroNIDAZOLE (METROCREAM) 0.75 % cream Apply 1 application topically daily. Reported on 11/22/2015     No current facility-administered medications for this encounter.     REVIEW OF SYSTEMS:  On review of systems, the patient reports that she is doing well overall. She denies any chest pain, fevers, chills, night sweats or unintended weight changes. She has had progressive shortness of breath on exertion and increased frequency of cough with clear/white sputum production.  She denies hemoptysis. She denies any bowel or bladder disturbances, and denies abdominal pain, nausea or vomiting. A complete review of systems is obtained and is otherwise negative.  PHYSICAL EXAM:  Wt Readings from Last 3 Encounters:  11/14/17 211 lb (95.7 kg)  11/12/17 211 lb 4.8 oz (95.8 kg)  10/15/17 210 lb 6.4 oz (95.4 kg)   Temp Readings from Last 3 Encounters:  11/14/17 98 F (36.7 C) (Oral)  11/12/17 97.9 F (36.6 C) (Oral)  10/15/17 98.4 F (36.9 C)  (Oral)   BP Readings from Last 3 Encounters:  11/14/17 (!) 129/59  11/12/17 138/70  10/15/17 (!) 139/58   Pulse Readings from Last 3 Encounters:  11/14/17 82  11/12/17 86  10/15/17 78   Pain Assessment Pain Score: 0-No pain/10  In general this is a well appearing caucasian female in no acute distress. She is alert and oriented x4 and appropriate throughout the examination. HEENT reveals that the patient is normocephalic, atraumatic. EOMs are intact. PERRLA. Skin is intact without any evidence of gross lesions. Cardiovascular exam reveals a regular rate and rhythm, no clicks rubs or murmurs are auscultated. Chest is clear to auscultation throughout the right lung field but with diffuse wheezing throughout the left lung field. Lymphatic assessment is performed and does not reveal any adenopathy in the cervical, supraclavicular, axillary, or inguinal chains. Abdomen has active bowel sounds in all quadrants and is intact. The abdomen is soft, non tender, non distended. Lower extremities are negative for pretibial pitting edema, deep calf tenderness, cyanosis or clubbing.  KPS = 100  100 - Normal; no complaints; no evidence of disease. 90   - Able to carry on normal activity; minor signs or symptoms of disease. 80   - Normal activity with effort; some signs or symptoms of disease. 1   - Cares for self; unable to carry on normal activity or to do active work. 60   - Requires occasional assistance, but is able to care for most of his personal needs. 50   - Requires considerable assistance and frequent medical care. 95   - Disabled; requires special care and assistance. 22   - Severely disabled; hospital admission is indicated although death not imminent. 65   - Very sick; hospital admission necessary; active supportive treatment necessary. 10   - Moribund; fatal processes progressing rapidly. 0     - Dead  Karnofsky DA, Abelmann [place], Craver LS and Bur[facility]al [facility] [phone]) The use of the nitrogen  mustards in the palliative treatment of carcinoma: with particular reference to bronchogenic carcinoma Cancer 1 [id number]  LABORATORY DATA:  Lab Results  Component Value Date   WBC 6.9 11/12/2017   HGB 12.0 11/12/2017   HCT 36.2 11/12/2017   MCV 92.6 11/12/2017   PLT 147 11/12/2017  Lab Results  Component Value Date   NA 140 11/12/2017   K 3.9 11/12/2017   CL 107 11/12/2017   CO2 25 11/12/2017   Lab Results  Component Value Date   ALT 17 11/12/2017   AST 23 11/12/2017   ALKPHOS 112 11/12/2017   BILITOT 0.4 11/12/2017     RADIOGRAPHY: Ct Chest W Contrast  Result Date: 11/08/2017 CLINICAL DATA:  Metastatic non-small cell lung cancer restaging EXAM: CT CHEST, ABDOMEN, AND PELVIS WITH CONTRAST TECHNIQUE: Multidetector CT imaging of the chest, abdomen and pelvis was performed following the standard protocol during bolus administration of intravenous contrast. CONTRAST:  152m ISOVUE-300 IOPAMIDOL (ISOVUE-300) INJECTION 61% COMPARISON:  Multiple exams, including 08/07/2017 FINDINGS: CT CHEST FINDINGS Cardiovascular: Atherosclerotic calcification of the aortic arch. Mediastinum/Nodes: Small calcified right anterior thyroid nodule. Small bilateral axillary lymph nodes. Left paraesophageal lymph node 0.8 cm in short axis on image 32/2, previously 0.6 cm. Small type 1 hiatal hernia. Lungs/Pleura: The left upper lobe suprahilar mass measures 5.9 by 3.9 cm on image 40/6, formerly 1.4 by 1.9 cm. The mass now completely occludes the apicoposterior segment bronchus in the left upper lobe. There is prominent narrowing of the remaining left upper lobe tracheobronchial tree by surrounding tumor, including the lingular segmental branches. Abnormal masslike density along the lingula is probably best matter measured transversely and measures 2.0 cm today, previously 1.9 cm. There is new nodularity in the lingula which could be from tumor nodules or postobstructive pneumonitis. Some of the lingular tumor abuts  the major fissure. Stable 6 by 4 mm left lower lobe pulmonary nodule on image 81/6. Stable 3 mm sub solid nodule in the right upper lobe on image 38/6. New 2 mm right lower lobe nodule on image 78/6. Musculoskeletal: Thoracic spondylosis. CT ABDOMEN PELVIS FINDINGS Hepatobiliary: Segment 4a lesion in the liver measures 1.6 by 0.8 cm, formerly 1.3 by 0.8 cm. A new hypoenhancing lesion in segment 2 of the liver measures 1.4 by 1.3 cm on image 44/2. A new lesion in segment 3 of the liver measures 1.0 by 1.0 cm on image 56/2. Questionable hypodense lesion in the dome of the right hepatic lobe on image 47/2. Gallstones in the gallbladder measure up to 6 mm in diameter. Pancreas: Unremarkable Spleen: Hypodense lesion along the inferior splenic hilar parenchyma measures 1.0 by 0.8 cm, stable from prior exam. 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 0.6 cm in long axis left kidney lower pole nonobstructive renal calculus. 4 by 2 mm left mid to lower pole nonobstructive renal calculus. Tiny hypodense lesion in the cortex of the right kidney upper pole on image 72/4 is technically nonspecific due to small size. Stomach/Bowel: Fullness along the ileocecal valve probably from stool material, less likely to be from a mass given the lack of finding in this vicinity 3 months ago. Vascular/Lymphatic: Aortoiliac atherosclerotic vascular disease. Reproductive: Uterus absent.  Adnexa unremarkable. Other: No supplemental non-categorized findings. Musculoskeletal: 4 mm grade 1 degenerative anterolisthesis at L4-5. Lower lumbar degenerative facet arthropathy. IMPRESSION: 1. Enlarging left upper lobe suprahilar mass with increased bronchial occlusion. New nodularity in the lingula potentially from tumor nodules are postobstructive pneumonitis. New 2 mm right lower lobe nodule. 2. There are at least 2 new hypoenhancing liver lesions compatible with metastatic disease. The previous existing lesion  in segment 4a of the liver is mildly increased in size compared to prior. 3. Other imaging findings of potential clinical significance: Aortic Atherosclerosis (ICD10-I70.0). Small type 1 hiatal  hernia. Cholelithiasis. Probable cyst medially in the spleen. 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 Nonobstructive left nephrolithiasis. Electronically Signed   By: Van Clines M.D.   On: 11/08/2017 15:20   Ct Abdomen Pelvis W Contrast  Result Date: 11/08/2017 CLINICAL DATA:  Metastatic non-small cell lung cancer restaging EXAM: CT CHEST, ABDOMEN, AND PELVIS WITH CONTRAST TECHNIQUE: Multidetector CT imaging of the chest, abdomen and pelvis was performed following the standard protocol during bolus administration of intravenous contrast. CONTRAST:  122m ISOVUE-300 IOPAMIDOL (ISOVUE-300) INJECTION 61% COMPARISON:  Multiple exams, including 08/07/2017 FINDINGS: CT CHEST FINDINGS Cardiovascular: Atherosclerotic calcification of the aortic arch. Mediastinum/Nodes: Small calcified right anterior thyroid nodule. Small bilateral axillary lymph nodes. Left paraesophageal lymph node 0.8 cm in short axis on image 32/2, previously 0.6 cm. Small type 1 hiatal hernia. Lungs/Pleura: The left upper lobe suprahilar mass measures 5.9 by 3.9 cm on image 40/6, formerly 1.4 by 1.9 cm. The mass now completely occludes the apicoposterior segment bronchus in the left upper lobe. There is prominent narrowing of the remaining left upper lobe tracheobronchial tree by surrounding tumor, including the lingular segmental branches. Abnormal masslike density along the lingula is probably best matter measured transversely and measures 2.0 cm today, previously 1.9 cm. There is new nodularity in the lingula which could be from tumor nodules or postobstructive pneumonitis. Some of the lingular tumor abuts the major fissure. Stable 6 by 4 mm left lower lobe pulmonary nodule on image 81/6. Stable 3 mm sub solid nodule in  the right upper lobe on image 38/6. New 2 mm right lower lobe nodule on image 78/6. Musculoskeletal: Thoracic spondylosis. CT ABDOMEN PELVIS FINDINGS Hepatobiliary: Segment 4a lesion in the liver measures 1.6 by 0.8 cm, formerly 1.3 by 0.8 cm. A new hypoenhancing lesion in segment 2 of the liver measures 1.4 by 1.3 cm on image 44/2. A new lesion in segment 3 of the liver measures 1.0 by 1.0 cm on image 56/2. Questionable hypodense lesion in the dome of the right hepatic lobe on image 47/2. Gallstones in the gallbladder measure up to 6 mm in diameter. Pancreas: Unremarkable Spleen: Hypodense lesion along the inferior splenic hilar parenchyma measures 1.0 by 0.8 cm, stable from prior exam. 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 0.6 cm in long axis left kidney lower pole nonobstructive renal calculus. 4 by 2 mm left mid to lower pole nonobstructive renal calculus. Tiny hypodense lesion in the cortex of the right kidney upper pole on image 72/4 is technically nonspecific due to small size. Stomach/Bowel: Fullness along the ileocecal valve probably from stool material, less likely to be from a mass given the lack of finding in this vicinity 3 months ago. Vascular/Lymphatic: Aortoiliac atherosclerotic vascular disease. Reproductive: Uterus absent.  Adnexa unremarkable. Other: No supplemental non-categorized findings. Musculoskeletal: 4 mm grade 1 degenerative anterolisthesis at L4-5. Lower lumbar degenerative facet arthropathy. IMPRESSION: 1. Enlarging left upper lobe suprahilar mass with increased bronchial occlusion. New nodularity in the lingula potentially from tumor nodules are postobstructive pneumonitis. New 2 mm right lower lobe nodule. 2. There are at least 2 new hypoenhancing liver lesions compatible with metastatic disease. The previous existing lesion in segment 4a of the liver is mildly increased in size compared to prior. 3. Other imaging findings of potential  clinical significance: Aortic Atherosclerosis (ICD10-I70.0). Small type 1 hiatal hernia. Cholelithiasis. Probable cyst medially in the spleen. 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 Nonobstructive left nephrolithiasis. Electronically Signed   By: Van Clines M.D.   On: 11/08/2017 15:20      IMPRESSION/PLAN: 1. 67 y.o. woman with Stage IV (T1b, N3, M1b) non-small cell lung cancer, adenocarcinoma of the left upper lobe with liver metastases.    Today, we talked to the patient and family about the findings and workup thus far. We discussed the natural history of metastatic NSCLC and general treatment, highlighting the role of radiotherapy in the management. We discussed the available radiation techniques, and focused on the details of logistics and delivery. Dr. Tammi Klippel recommends a 2 week course of daily radiotherapy to the LUL lesion.  We reviewed the anticipated acute and late sequelae associated with radiation in this setting. The patient was encouraged to ask questions that were answered to her satisfaction.  At the conclusion of our conversation, the patient elects to proceed with palliative radiotherapy to the enlarging left upper lobe lesion.  She is freely signed written consent to proceed and a copy of this document is placed in her medical record.  She is scheduled for CT simulation following our visit today in anticipation of beginning treatment on Monday, 11/19/2017.  More than 50% of today's visit was spent in counseling and/or coordination of care.   Nicholos Johns, PA-C    Tyler Pita, MD  [facility] Direct Dial: [phone]  Fax: [phone] [email or web address]  Skype  LinkedIn

## 2017-11-16 DIAGNOSIS — Z510 Encounter for antineoplastic radiation therapy: Secondary | ICD-10-CM | POA: Diagnosis not present

## 2017-11-16 DIAGNOSIS — C3412 Malignant neoplasm of upper lobe, left bronchus or lung: Secondary | ICD-10-CM | POA: Diagnosis not present

## 2017-11-19 ENCOUNTER — Ambulatory Visit
Admission: RE | Admit: 2017-11-19 | Discharge: 2017-11-19 | Disposition: A | Discharge status: Home / Self Care | Payer: Medicare Other | Source: Ambulatory Visit | Attending: Radiation Oncology | Admitting: Radiation Oncology

## 2017-11-19 DIAGNOSIS — C3412 Malignant neoplasm of upper lobe, left bronchus or lung: Secondary | ICD-10-CM | POA: Diagnosis not present

## 2017-11-19 DIAGNOSIS — Z510 Encounter for antineoplastic radiation therapy: Secondary | ICD-10-CM | POA: Diagnosis not present

## 2017-11-20 ENCOUNTER — Ambulatory Visit
Admission: RE | Admit: 2017-11-20 | Discharge: 2017-11-20 | Disposition: A | Discharge status: Home / Self Care | Payer: Medicare Other | Source: Ambulatory Visit | Attending: Radiation Oncology | Admitting: Radiation Oncology

## 2017-11-20 DIAGNOSIS — C3412 Malignant neoplasm of upper lobe, left bronchus or lung: Secondary | ICD-10-CM | POA: Diagnosis not present

## 2017-11-20 DIAGNOSIS — Z510 Encounter for antineoplastic radiation therapy: Secondary | ICD-10-CM | POA: Diagnosis not present

## 2017-11-21 ENCOUNTER — Ambulatory Visit
Admission: RE | Admit: 2017-11-21 | Discharge: 2017-11-21 | Disposition: A | Discharge status: Home / Self Care | Payer: Medicare Other | Source: Ambulatory Visit | Attending: Radiation Oncology | Admitting: Radiation Oncology

## 2017-11-21 DIAGNOSIS — C3412 Malignant neoplasm of upper lobe, left bronchus or lung: Secondary | ICD-10-CM | POA: Diagnosis not present

## 2017-11-21 DIAGNOSIS — Z510 Encounter for antineoplastic radiation therapy: Secondary | ICD-10-CM | POA: Diagnosis not present

## 2017-11-22 ENCOUNTER — Ambulatory Visit
Admission: RE | Admit: 2017-11-22 | Discharge: 2017-11-22 | Disposition: A | Discharge status: Home / Self Care | Payer: Medicare Other | Source: Ambulatory Visit | Attending: Radiation Oncology | Admitting: Radiation Oncology

## 2017-11-22 DIAGNOSIS — Z510 Encounter for antineoplastic radiation therapy: Secondary | ICD-10-CM | POA: Diagnosis not present

## 2017-11-22 DIAGNOSIS — C3412 Malignant neoplasm of upper lobe, left bronchus or lung: Secondary | ICD-10-CM | POA: Diagnosis not present

## 2017-11-23 ENCOUNTER — Ambulatory Visit
Admission: RE | Admit: 2017-11-23 | Discharge: 2017-11-23 | Disposition: A | Discharge status: Home / Self Care | Payer: Medicare Other | Source: Ambulatory Visit | Attending: Radiation Oncology | Admitting: Radiation Oncology

## 2017-11-23 DIAGNOSIS — C3412 Malignant neoplasm of upper lobe, left bronchus or lung: Secondary | ICD-10-CM | POA: Diagnosis not present

## 2017-11-23 DIAGNOSIS — Z510 Encounter for antineoplastic radiation therapy: Secondary | ICD-10-CM | POA: Diagnosis not present

## 2017-11-26 ENCOUNTER — Ambulatory Visit
Admission: RE | Admit: 2017-11-26 | Discharge: 2017-11-26 | Disposition: A | Discharge status: Home / Self Care | Payer: Medicare Other | Source: Ambulatory Visit | Attending: Radiation Oncology | Admitting: Radiation Oncology

## 2017-11-26 ENCOUNTER — Other Ambulatory Visit: Payer: Self-pay | Admitting: Nurse Practitioner

## 2017-11-26 DIAGNOSIS — Z510 Encounter for antineoplastic radiation therapy: Secondary | ICD-10-CM | POA: Diagnosis not present

## 2017-11-26 DIAGNOSIS — C3492 Malignant neoplasm of unspecified part of left bronchus or lung: Secondary | ICD-10-CM

## 2017-11-26 DIAGNOSIS — C3412 Malignant neoplasm of upper lobe, left bronchus or lung: Secondary | ICD-10-CM | POA: Insufficient documentation

## 2017-11-26 NOTE — Progress Notes (Addendum)
[place]  [phone] [phone]  Clinic Follow up Note   Patient Care Team: System, Pcp Not In as PCP - General 11/27/2017  SUMMARY OF ONCOLOGIC HISTORY: Oncology History   Patient presented with sycopal episode to ED.  Work up showed LUL mass.  Malignant neoplasm of upper lobe of left lung   Primary site: Lung (Left)   Staging method: AJCC 7th Edition   Clinical: Stage IV (T1b, N3, M1b) signed by Curt Bears, MD on 11/15/2013 12:39 PM   Summary: Stage IV (T1b, N3, M1b)       Malignant neoplasm of upper lobe of left lung ([place])   10/27/2013 Pathology Results    Transbronchial biopsy LUL lung adenocarcinoma      10/27/2013 Surgery    Bronchoscopy      11/06/2013 Imaging    MRI Brain with and without contrast impression no acute or metastatic intracrainial abnoramlity.        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  PRIOR THERAPY: 1) Tarceva 150 mg by mouth daily. Started 11/25/2013. Status post 5 months of treatment discontinued today secondary to intolerance with persistent paronychia. 2) Tarceva 100 mg by mouth daily. Started 06/03/2014. Status post 8 months of treatment.  3) stereotactic radiotherapy to liver lesions under the care of Dr. Tammi Klippel. Last dose 03/02/2017  CURRENT THERAPY: Tagrisso 80 mg by mouth daily status post 31 months of treatment. First dose was given 03/04/2015. Palliative radiation per Dr. Tammi Klippel beginning 6/24, plan  2 week course    INTERVAL HISTORY: Ms. Coca returns for follow-up as scheduled.  She began palliative radiation on 6/24 per Dr. Tammi Klippel.  She is tolerating well so far.  She notes slight improvement in cough, wheezing, and dyspnea on exertion.  Cough is productive in the morning with clear mucus.  Gets nauseous after coughing up phlegm.  Robitussin did not help much but NyQuil helps her cough.  Denies chest pain or hemoptysis.  Energy and appetite remain low although but continues to work full-time.  Drinks Ensure daily.  Weight is down 2 pounds.  Denies fever or chills.  REVIEW OF SYSTEMS:   Constitutional: Denies fevers, chills or (+) 2 pounds weight loss (+) decreased appetite Eyes: Denies blurriness of vision Ears, nose, mouth, throat, and face: Denies mucositis or sore throat Respiratory: Denies hemoptysis (+) cough, productive in the morning (+) dyspnea on exertion (+) wheezes Cardiovascular: Denies palpitation, chest discomfort or lower extremity swelling Gastrointestinal:  Denies vomiting, constipation, diarrhea, dysphagia, heartburn or change in bowel habits (+) nausea in a.m. after coughing Skin: Denies abnormal skin rashes Lymphatics: Denies new lymphadenopathy or easy bruising Neurological:Denies numbness, tingling or new weaknesses Behavioral/Psych: Mood is stable, no new changes  All other systems were reviewed with the patient and are negative.  MEDICAL HISTORY:  Past Medical History:  Diagnosis Date  . High blood pressure   . Liver lesion 06/26/2016  . Non-small cell carcinoma of lung, stage 4 ([place]) dx'd 10/2013    SURGICAL  HISTORY: Past Surgical History:  Procedure Laterality Date  . ABDOMINAL HYSTERECTOMY    . BREAST BIOPSY     Right breast  . VIDEO BRONCHOSCOPY Bilateral 10/27/2013   Procedure: VIDEO BRONCHOSCOPY WITH FLUORO;  Surgeon: Collene Gobble, MD;  Location: [facility];  Service: Cardiopulmonary;  Laterality: Bilateral;    I have reviewed the social  history and family history with the patient and they are unchanged from previous note.  ALLERGIES:  is allergic to penicillins.  MEDICATIONS:  Current Outpatient Medications  Medication Sig Dispense Refill  . acetaminophen (TYLENOL) 325 MG tablet Take 650 mg by mouth every 6 (six) hours as needed. Reported on 06/15/2015    . calcium carbonate (OS-CAL) 600 MG TABS tablet Take 600 mg by mouth daily.     Marland Kitchen CLIMARA 0.1 MG/24HR patch 1 patch once a week.    . levothyroxine (SYNTHROID, LEVOTHROID) 50 MCG tablet Take 50 mcg by mouth daily.    Marland Kitchen loperamide (IMODIUM) 2 MG capsule Take by mouth as needed for diarrhea or loose stools. Reported on 11/22/2015    . Multiple Vitamins-Calcium (ONE-A-DAY WOMENS PO) Take 1 tablet by mouth daily.    . potassium chloride SA (K-DUR,KLOR-CON) 20 MEQ tablet 1 tablet 2 (two) times daily.    . Sodium Fluoride (CLINPRO 5000) 1.1 % PSTE Place 1 application onto teeth 3 (three) times daily.    Marland Kitchen spironolactone (ALDACTONE) 25 MG tablet 1 tablet daily.    . vitamin C (ASCORBIC ACID) 500 MG tablet Take 500 mg by mouth daily.    Marland Kitchen guaifenesin (ROBITUSSIN) 100 MG/5ML syrup Take 200 mg by mouth 3 (three) times daily as needed for cough.    . metroNIDAZOLE (METROCREAM) 0.75 % cream Apply 1 application topically daily. Reported on 11/22/2015    . prochlorperazine (COMPAZINE) 10 MG tablet Take 1 tablet (10 mg total) by mouth every 6 (six) hours as needed for nausea or vomiting. 30 tablet 3   No current facility-administered medications for this visit.     PHYSICAL EXAMINATION: ECOG PERFORMANCE STATUS: 1 - Symptomatic but completely ambulatory  Vitals:   11/27/17 0906  BP: (!) 128/58  Pulse: 85  Resp: 18  Temp: 98.1 F (36.7 C)  SpO2: 100%   Filed Weights   11/27/17 0906  Weight: 209 lb 1.6 oz (94.8 kg)    GENERAL:alert, no distress and comfortable SKIN: no rashes or significant lesions EYES: normal, Conjunctiva are pink and non-injected, sclera  clear OROPHARYNX:no thrush or ulcers LYMPH:  no palpable cervical or supraclavicular lymphadenopathy  LUNGS: Wheezes bilaterally in bases; normal breathing effort HEART: regular rate & rhythm and no murmurs and no lower extremity edema ABDOMEN:abdomen soft, non-tender and normal bowel sounds Musculoskeletal:no cyanosis of digits and no clubbing  NEURO: alert & oriented x 3 with fluent speech, no focal motor/sensory deficits  LABORATORY DATA:  I have reviewed the data as listed CBC Latest Ref Rng & Units 11/27/2017 11/12/2017 11/08/2017  WBC 3.9 - 10.3 K/uL 6.0 6.9 6.2  Hemoglobin 11.6 - 15.9 g/dL 11.8 12.0 11.9  Hematocrit 34.8 - 46.6 % 35.9 36.2 35.6  Platelets 145 - 400 K/uL 153 147 144(L)     CMP Latest Ref Rng & Units 11/27/2017 11/12/2017 11/08/2017  Glucose 70 - 99 mg/dL 111(H) 86 74  BUN 8 - 23 mg/dL _0 Creatinine 0.44 - 1.00 mg/dL 0.76 0.79 0.78  Sodium 135 - 145 mmol/L 139 140 140  Potassium 3.5 - 5.1 mmol/L 3.9 3.9 4.4  Chloride 98 - 111 mmol/L 104 107 105  CO2 22 - 32 mmol/L _0 Calcium 8.9 - 10.3 mg/dL 9.6 8.9 9.5  Total Protein 6.5 - 8.1 g/dL 7.3 7.3 7.5  Total Bilirubin 0.3 - 1.2 mg/dL 0.6 0.4 0.4  Alkaline Phos 38 - 126 U/L 119 112 115  AST 15 - 41 U/L _1 ALT 0 - 44 U/L _2 RADIOGRAPHIC STUDIES: I have personally reviewed the radiological images as listed and agreed with the findings in the report. No results found.   ASSESSMENT & PLAN: This is a very pleasant 67 years old white female with a stage IV non-small cell lung cancer, adenocarcinoma with positive EGFR mutation in exon 21 (L858R) diagnosed in May 2015 status post treatment with Tarceva for 13 months discontinued secondary to disease progression and development of T727M resistant mutation.  The patient is undergoing treatment with Tagrisso 80 mg by mouth daily status post 31 months. The patient was seen with Dr. Julien Nordmann who reviewed Guardant 360 results which indicate she has no  actionable mutations other than what was previously known. Dr. Julien Nordmann recommends to continue Tagrisso for 2 more months and complete 2 week course of palliative radiation, she has 4 treatments remaining. He plans to see her back in 1 month for lab and f/u, he will then order restaging scan at that visit.  Her respiratory symptoms are stable. She takes Nyquil at night. I prescribed compazine for intermittent mild nausea. Labs are unremarkable.   Plan: -Labs reviewed -[place] for 2 more months and complete palliative radiation this week -Rx: compazine 10 mg 1 tab po q6h PRN for nausea  -Lab, f/u with Dr. Julien Nordmann in 1 month, will order restaging scan at next visit   All questions were answered. The patient knows to call the clinic with any problems, questions or concerns. No barriers to learning was detected.     Alla Feeling, NP 11/27/17   ADDENDUM: Hematology/Oncology Attending: I had a face-to-face encounter with the patient today.  I recommended her care plan.  This is a very pleasant 67 years old white female with a stage IV non-small cell lung cancer, adenocarcinoma with positive EGFR mutation initially diagnosed in May 2015 status post first-line treatment with Tarceva for 13 months discontinued secondary to disease progression and T7 27M resistant mutation.  The patient was a started on treatment with Tagrisso 80 mg p.o. daily status post 41-monthtreatment.  She has been tolerating this treatment fairly well.  Unfortunately recent imaging studies showed evidence for disease progression with enlarging left lung nodules.  She is currently undergoing palliative radiotherapy to the progressive disease in the left lung. I ordered a blood test by Guardant 360 which was performed recently and showed the persistent T790 M resistant mutation.  There is no development of any other actionable mutations. I recommended for the patient to continue her current treatment with Tagrisso for now.   We will treat her with 2 more months of this regimen before repeat imaging studies.  We already treated the progressive lesion with palliative radiotherapy. The patient and her husband agreed to the current plan. I will see her back for follow-up visit in 1 month for evaluation with repeat blood work. She was advised to call immediately if she has any concerning symptoms in the interval.  Disclaimer: This note was dictated with voice recognition software. Similar sounding words can inadvertently be transcribed and may  be missed upon review. Eilleen Kempf, MD 11/27/17

## 2017-11-27 ENCOUNTER — Encounter: Payer: Self-pay | Admitting: Nurse Practitioner

## 2017-11-27 ENCOUNTER — Ambulatory Visit
Admission: RE | Admit: 2017-11-27 | Discharge: 2017-11-27 | Disposition: A | Discharge status: Home / Self Care | Payer: Medicare Other | Source: Ambulatory Visit | Attending: Radiation Oncology | Admitting: Radiation Oncology

## 2017-11-27 ENCOUNTER — Inpatient Hospital Stay: Payer: Medicare Other | Attending: Internal Medicine | Admitting: Nurse Practitioner

## 2017-11-27 ENCOUNTER — Telehealth: Payer: Self-pay | Admitting: Internal Medicine

## 2017-11-27 ENCOUNTER — Inpatient Hospital Stay: Payer: Medicare Other

## 2017-11-27 ENCOUNTER — Other Ambulatory Visit: Payer: Self-pay | Admitting: Nurse Practitioner

## 2017-11-27 VITALS — BP 128/58 | HR 85 | Temp 98.1°F | Resp 18 | Ht 66.0 in | Wt 209.1 lb

## 2017-11-27 DIAGNOSIS — I10 Essential (primary) hypertension: Secondary | ICD-10-CM | POA: Diagnosis not present

## 2017-11-27 DIAGNOSIS — C778 Secondary and unspecified malignant neoplasm of lymph nodes of multiple regions: Secondary | ICD-10-CM | POA: Diagnosis not present

## 2017-11-27 DIAGNOSIS — Z90710 Acquired absence of both cervix and uterus: Secondary | ICD-10-CM

## 2017-11-27 DIAGNOSIS — R110 Nausea: Secondary | ICD-10-CM

## 2017-11-27 DIAGNOSIS — Z510 Encounter for antineoplastic radiation therapy: Secondary | ICD-10-CM | POA: Diagnosis not present

## 2017-11-27 DIAGNOSIS — Z79899 Other long term (current) drug therapy: Secondary | ICD-10-CM | POA: Diagnosis not present

## 2017-11-27 DIAGNOSIS — C3492 Malignant neoplasm of unspecified part of left bronchus or lung: Secondary | ICD-10-CM

## 2017-11-27 DIAGNOSIS — C3412 Malignant neoplasm of upper lobe, left bronchus or lung: Secondary | ICD-10-CM | POA: Diagnosis not present

## 2017-11-27 DIAGNOSIS — Z923 Personal history of irradiation: Secondary | ICD-10-CM | POA: Diagnosis not present

## 2017-11-27 DIAGNOSIS — C787 Secondary malignant neoplasm of liver and intrahepatic bile duct: Secondary | ICD-10-CM | POA: Insufficient documentation

## 2017-11-27 LAB — CBC WITH DIFFERENTIAL (CANCER CENTER ONLY)
BASOS PCT: 0 %
Basophils Absolute: 0 10*3/uL (ref 0.0–0.1)
EOS ABS: 0.1 10*3/uL (ref 0.0–0.5)
Eosinophils Relative: 2 %
HCT: 35.9 % (ref 34.8–46.6)
HEMOGLOBIN: 11.8 g/dL (ref 11.6–15.9)
Lymphocytes Relative: 15 %
Lymphs Abs: 0.9 10*3/uL (ref 0.9–3.3)
MCH: 30.4 pg (ref 25.1–34.0)
MCHC: 32.9 g/dL (ref 31.5–36.0)
MCV: 92.5 fL (ref 79.5–101.0)
MONOS PCT: 10 %
Monocytes Absolute: 0.6 10*3/uL (ref 0.1–0.9)
NEUTROS PCT: 73 %
Neutro Abs: 4.3 10*3/uL (ref 1.5–6.5)
Platelet Count: 153 10*3/uL (ref 145–400)
RBC: 3.88 MIL/uL (ref 3.70–5.45)
RDW: 13.6 % (ref 11.2–14.5)
WBC: 6 10*3/uL (ref 3.9–10.3)

## 2017-11-27 LAB — CMP (CANCER CENTER ONLY)
ALK PHOS: 119 U/L (ref 38–126)
ALT: 18 U/L (ref 0–44)
AST: 22 U/L (ref 15–41)
Albumin: 3.7 g/dL (ref 3.5–5.0)
Anion gap: 7 (ref 5–15)
BUN: 17 mg/dL (ref 8–23)
CALCIUM: 9.6 mg/dL (ref 8.9–10.3)
CO2: 28 mmol/L (ref 22–32)
CREATININE: 0.76 mg/dL (ref 0.44–1.00)
Chloride: 104 mmol/L (ref 98–111)
Glucose, Bld: 111 mg/dL — ABNORMAL HIGH (ref 70–99)
Potassium: 3.9 mmol/L (ref 3.5–5.1)
Sodium: 139 mmol/L (ref 135–145)
Total Bilirubin: 0.6 mg/dL (ref 0.3–1.2)
Total Protein: 7.3 g/dL (ref 6.5–8.1)

## 2017-11-27 MED ORDER — PROCHLORPERAZINE MALEATE 10 MG PO TABS: 10.0000 mg | ORAL_TABLET | Freq: Four times a day (QID) | ORAL | 3 refills | Status: DC | PRN

## 2017-11-27 NOTE — Telephone Encounter (Signed)
Appointments scheduled AVS/Calendar printed per 7/2 los °

## 2017-11-28 ENCOUNTER — Ambulatory Visit
Admission: RE | Admit: 2017-11-28 | Discharge: 2017-11-28 | Disposition: A | Discharge status: Home / Self Care | Payer: Medicare Other | Source: Ambulatory Visit | Attending: Radiation Oncology | Admitting: Radiation Oncology

## 2017-11-28 DIAGNOSIS — Z510 Encounter for antineoplastic radiation therapy: Secondary | ICD-10-CM | POA: Diagnosis not present

## 2017-11-28 DIAGNOSIS — C3412 Malignant neoplasm of upper lobe, left bronchus or lung: Secondary | ICD-10-CM | POA: Diagnosis not present

## 2017-11-30 ENCOUNTER — Ambulatory Visit
Admission: RE | Admit: 2017-11-30 | Discharge: 2017-11-30 | Disposition: A | Discharge status: Home / Self Care | Payer: Medicare Other | Source: Ambulatory Visit | Attending: Radiation Oncology | Admitting: Radiation Oncology

## 2017-11-30 DIAGNOSIS — C3412 Malignant neoplasm of upper lobe, left bronchus or lung: Secondary | ICD-10-CM | POA: Diagnosis not present

## 2017-11-30 DIAGNOSIS — Z510 Encounter for antineoplastic radiation therapy: Secondary | ICD-10-CM | POA: Diagnosis not present

## 2017-11-30 LAB — GUARDANT 360

## 2017-12-03 ENCOUNTER — Ambulatory Visit
Admission: RE | Admit: 2017-12-03 | Discharge: 2017-12-03 | Disposition: A | Discharge status: Home / Self Care | Payer: Medicare Other | Source: Ambulatory Visit | Attending: Radiation Oncology | Admitting: Radiation Oncology

## 2017-12-03 DIAGNOSIS — Z510 Encounter for antineoplastic radiation therapy: Secondary | ICD-10-CM | POA: Diagnosis not present

## 2017-12-03 DIAGNOSIS — C3412 Malignant neoplasm of upper lobe, left bronchus or lung: Secondary | ICD-10-CM | POA: Diagnosis not present

## 2017-12-14 ENCOUNTER — Encounter: Payer: Self-pay | Admitting: Radiation Oncology

## 2017-12-14 NOTE — Progress Notes (Signed)
  Radiation Oncology         [phone] ________________________________  Name: Debra Hodges MRN: [id number]  Date: 12/14/2017  DOB: 1951-05-17   End of Treatment Note  Diagnosis: 67 year-old female with Stage IV (T1b, N3, M1b) EGFR+ adenocarcinoma of the left upper lung with two isolated progressive liver metastases.      Indication for treatment:  Curative   Radiation treatment dates:   11/19/2017-708/2019  Site/dose:   The primary LUL lesion and involved mediastinal adenopathy were treated to 30 Gy in 10 fractions of 3 Gy.  Beams/energy:   A five field 3D conformal treatment arrangement was used delivering 6 and 10 MV photons.  Daily image-guidance CT was used to align the treatment with the targeted volume  Narrative: The patient tolerated radiation treatment relatively well.  At the beginning of treatment, she endorsed nausea, but that resolved after completing Tagrisso. Throughout treatment she endorsed fatigue and SOB with exertion which remained stable and by the end of treatment she developed mild dysphagia. She denied any skin changes.   Plan: The patient has completed radiation treatment. The patient will return to radiation oncology clinic for routine followup in one month. I advised her to call or return sooner if she has any questions or concerns related to her recovery or treatment.  ________________________________  Sheral Apley. Tammi Klippel, M.D.  This document serves as a record of services personally performed by Tyler Pita, MD. It was created on his behalf by Vanessa Ralphs, a trained medical scribe. The creation of this record is based on the scribe's personal observations and the provider's statements to them. This document has been checked and approved by the attending provider.

## 2017-12-26 ENCOUNTER — Other Ambulatory Visit: Payer: Self-pay | Admitting: Oncology

## 2017-12-26 DIAGNOSIS — C3412 Malignant neoplasm of upper lobe, left bronchus or lung: Secondary | ICD-10-CM

## 2017-12-27 ENCOUNTER — Inpatient Hospital Stay (HOSPITAL_BASED_OUTPATIENT_CLINIC_OR_DEPARTMENT_OTHER): Payer: Medicare Other | Admitting: Oncology

## 2017-12-27 ENCOUNTER — Other Ambulatory Visit: Payer: Self-pay

## 2017-12-27 ENCOUNTER — Encounter: Payer: Self-pay | Admitting: Oncology

## 2017-12-27 ENCOUNTER — Telehealth: Payer: Self-pay | Admitting: Oncology

## 2017-12-27 ENCOUNTER — Inpatient Hospital Stay: Payer: Medicare Other | Attending: Internal Medicine

## 2017-12-27 VITALS — BP 134/70 | HR 88 | Temp 97.7°F | Resp 17 | Ht 66.0 in | Wt 212.3 lb

## 2017-12-27 DIAGNOSIS — Z923 Personal history of irradiation: Secondary | ICD-10-CM | POA: Insufficient documentation

## 2017-12-27 DIAGNOSIS — C7801 Secondary malignant neoplasm of right lung: Secondary | ICD-10-CM | POA: Insufficient documentation

## 2017-12-27 DIAGNOSIS — Z79899 Other long term (current) drug therapy: Secondary | ICD-10-CM

## 2017-12-27 DIAGNOSIS — C787 Secondary malignant neoplasm of liver and intrahepatic bile duct: Secondary | ICD-10-CM | POA: Diagnosis not present

## 2017-12-27 DIAGNOSIS — C778 Secondary and unspecified malignant neoplasm of lymph nodes of multiple regions: Secondary | ICD-10-CM

## 2017-12-27 DIAGNOSIS — C3492 Malignant neoplasm of unspecified part of left bronchus or lung: Secondary | ICD-10-CM

## 2017-12-27 DIAGNOSIS — I10 Essential (primary) hypertension: Secondary | ICD-10-CM

## 2017-12-27 DIAGNOSIS — C3412 Malignant neoplasm of upper lobe, left bronchus or lung: Secondary | ICD-10-CM

## 2017-12-27 LAB — CBC WITH DIFFERENTIAL (CANCER CENTER ONLY)
Basophils Absolute: 0 10*3/uL (ref 0.0–0.1)
Basophils Relative: 0 %
Eosinophils Absolute: 0.1 10*3/uL (ref 0.0–0.5)
Eosinophils Relative: 1 %
HEMATOCRIT: 34.6 % — AB (ref 34.8–46.6)
HEMOGLOBIN: 11.4 g/dL — AB (ref 11.6–15.9)
LYMPHS ABS: 0.8 10*3/uL — AB (ref 0.9–3.3)
Lymphocytes Relative: 11 %
MCH: 30.7 pg (ref 25.1–34.0)
MCHC: 32.9 g/dL (ref 31.5–36.0)
MCV: 93.3 fL (ref 79.5–101.0)
MONO ABS: 0.7 10*3/uL (ref 0.1–0.9)
MONOS PCT: 10 %
NEUTROS ABS: 5.5 10*3/uL (ref 1.5–6.5)
NEUTROS PCT: 78 %
Platelet Count: 130 10*3/uL — ABNORMAL LOW (ref 145–400)
RBC: 3.71 MIL/uL (ref 3.70–5.45)
RDW: 13.8 % (ref 11.2–14.5)
WBC Count: 7.1 10*3/uL (ref 3.9–10.3)

## 2017-12-27 LAB — CMP (CANCER CENTER ONLY)
ALBUMIN: 3.4 g/dL — AB (ref 3.5–5.0)
ALK PHOS: 113 U/L (ref 38–126)
ALT: 28 U/L (ref 0–44)
ANION GAP: 6 (ref 5–15)
AST: 31 U/L (ref 15–41)
BUN: 17 mg/dL (ref 8–23)
CALCIUM: 8.8 mg/dL — AB (ref 8.9–10.3)
CO2: 26 mmol/L (ref 22–32)
Chloride: 108 mmol/L (ref 98–111)
Creatinine: 0.73 mg/dL (ref 0.44–1.00)
GFR, Est AFR Am: >60 mL/min (ref >60)
GFR, Estimated: >60 mL/min (ref >60)
GLUCOSE: 102 mg/dL — AB (ref 70–99)
Potassium: 4.2 mmol/L (ref 3.5–5.1)
Sodium: 140 mmol/L (ref 135–145)
Total Bilirubin: 0.4 mg/dL (ref 0.3–1.2)
Total Protein: 6.9 g/dL (ref 6.5–8.1)

## 2017-12-27 NOTE — Assessment & Plan Note (Signed)
This is a very pleasant 67 year old white female with a stage IV non-small cell lung cancer, adenocarcinoma with positive EGFR mutation in exon 21 (L858R) diagnosed in May 2015 status post treatment with Tarceva for 13 months discontinued secondary to disease progression and development of T790M resistant mutation. The patient is currently undergoing treatment with Tagrisso 80 mg by mouth daily status post 33 months. The patient has been tolerating this treatment well with no concerning complaints. Repeat CT scan of the chest, abdomen and pelvis was performed recently which showed evidence for disease progression with enlarging left upper lobe suprahilar mass with increased bronchial occlusion as well as new pulmonary nodules in the right lower lobe and progressive liver disease.  Guardant 360 testing was performed recently which showed a persistent T7 85 M resistant mutation and no development of any other actionable mutations.  She is status post palliative radiation to the left upper lobe lung nodule. She remains on Tagrisso and is tolerating it well.  Recommend she continue on Tagrisso.  She will have a restaging CT scan of the chest, abdomen, pelvis in approximately 1 month and will follow-up 1 to 2 days after the CT scan to discuss the results.  For her cough, she was advised to use a cough suppressant such as Delsym.  She was advised to call immediately if she has any concerning symptoms in the interval. The patient voices understanding of current disease status and treatment options and is in agreement with the current care plan. All questions were answered. The patient knows to call the clinic with any problems, questions or concerns. We can certainly see the patient much sooner if necessary.

## 2017-12-27 NOTE — Progress Notes (Signed)
[place] OFFICE PROGRESS NOTE  System, Pcp Not In No address on file  DIAGNOSIS:Stage IV (T1b, N3, M1b) non-small cell lung cancer, adenocarcinoma with positive EGFR mutation in exon 21 (L858R) and recently found to have T790M resistant mutation, presented with left upper lobe lung mass in addition to left hilar and bilateral mediastinal lymphadenopathy as well as liver metastasis diagnosed in May of 2015.  PRIOR THERAPY: 1) Tarceva 150 mg by mouth daily. Started 11/25/2013. Status post 5 months of treatment discontinued today secondary to intolerance with persistent paronychia. 2) Tarceva 100 mg by mouth daily. Started 06/03/2014. Status post 8 months of treatment.  3) stereotactic radiotherapy to liver lesions under the care of Dr. Tammi Klippel. Last dose 03/02/2017 4) palliative radiation to the left upper lobe lung lesion completed on 12/03/2017.  CURRENT THERAPY: Tagrisso 80 mg by mouth daily status post 23month of treatment. First dose was given 03/04/2015.   INTERVAL HISTORY: Debra Galindez67y.o. female returns for routine follow-up visit accompanied by her husband.  The patient reports that she has an ongoing cough which is been going on for several months.  It has not really worsened.  She tried Robitussin for a while but it was not effective.  She denies fevers and chills.  Denies chest pain, shortness of breath, hemoptysis.  Denies nausea, vomiting, constipation, diarrhea.  Denies recent weight loss or night sweats.  The patient is here for evaluation and repeat lab work.  MEDICAL HISTORY: Past Medical History:  Diagnosis Date  . High blood pressure   . Liver lesion 06/26/2016  . Non-small cell carcinoma of lung, stage 4 ([place]    ALLERGIES:  is allergic to penicillins.  MEDICATIONS:  Current Outpatient Medications  Medication Sig Dispense Refill  . acetaminophen (TYLENOL) 325 MG tablet Take 650 mg by mouth every 6 (six) hours as needed. Reported  on 06/15/2015    . calcium carbonate (OS-CAL) 600 MG TABS tablet Take 600 mg by mouth daily.     .Marland KitchenCLIMARA 0.1 MG/24HR patch 1 patch once a week.    .Marland Kitchenguaifenesin (ROBITUSSIN) 100 MG/5ML syrup Take 200 mg by mouth 3 (three) times daily as needed for cough.    . levothyroxine (SYNTHROID, LEVOTHROID) 50 MCG tablet Take 50 mcg by mouth daily.    .Marland Kitchenloperamide (IMODIUM) 2 MG capsule Take by mouth as needed for diarrhea or loose stools. Reported on 11/22/2015    . metroNIDAZOLE (METROCREAM) 0.75 % cream Apply 1 application topically daily. Reported on 11/22/2015    . Multiple Vitamins-Calcium (ONE-A-DAY WOMENS PO) Take 1 tablet by mouth daily.    .Marland Kitchenosimertinib mesylate (TAGRISSO) 80 MG tablet Take 80 mg by mouth daily.    . potassium chloride SA (K-DUR,KLOR-CON) 20 MEQ tablet 1 tablet 2 (two) times daily.    . prochlorperazine (COMPAZINE) 10 MG tablet Take 1 tablet (10 mg total) by mouth every 6 (six) hours as needed for nausea or vomiting. 30 tablet 3  . Sodium Fluoride (CLINPRO 5000) 1.1 % PSTE Place 1 application onto teeth 3 (three) times daily.    .Marland Kitchenspironolactone (ALDACTONE) 25 MG tablet 1 tablet daily.    . vitamin C (ASCORBIC ACID) 500 MG tablet Take 500 mg by mouth daily.     No current facility-administered medications for this visit.     SURGICAL HISTORY:  Past Surgical History:  Procedure Laterality Date  . ABDOMINAL HYSTERECTOMY    . BREAST BIOPSY     Right breast  .  VIDEO BRONCHOSCOPY Bilateral 10/27/2013   Procedure: VIDEO BRONCHOSCOPY WITH FLUORO;  Surgeon: Collene Gobble, MD;  Location: [facility];  Service: Cardiopulmonary;  Laterality: Bilateral;    REVIEW OF SYSTEMS:   Review of Systems  Constitutional: Negative for appetite change, chills, fatigue, fever and unexpected weight change.  HENT:   Negative for mouth sores, nosebleeds, sore throat and trouble swallowing.   Eyes: Negative for eye problems and icterus.  Respiratory: Negative for hemoptysis, shortness of breath  and wheezing.  Positive for cough.  Cardiovascular: Negative for chest pain and leg swelling.  Gastrointestinal: Negative for abdominal pain, constipation, diarrhea, nausea and vomiting.  Genitourinary: Negative for bladder incontinence, difficulty urinating, dysuria, frequency and hematuria.   Musculoskeletal: Negative for back pain, gait problem, neck pain and neck stiffness.  Skin: Negative for itching and rash.  Neurological: Negative for dizziness, extremity weakness, gait problem, headaches, light-headedness and seizures.  Hematological: Negative for adenopathy. Does not bruise/bleed easily.  Psychiatric/Behavioral: Negative for confusion, depression and sleep disturbance. The patient is not nervous/anxious.     PHYSICAL EXAMINATION:  Blood pressure 134/70, pulse 88, temperature 97.7 F (36.5 C), temperature source Oral, resp. rate 17, height '5\' 6"'$  (1.676 m), weight 212 lb 4.8 oz (96.3 kg), SpO2 100 %.  ECOG PERFORMANCE STATUS: 1 - Symptomatic but completely ambulatory  Physical Exam  Constitutional: Oriented to person, place, and time and well-developed, well-nourished, and in no distress. No distress.  HENT:  Head: Normocephalic and atraumatic.  Mouth/Throat: Oropharynx is clear and moist. No oropharyngeal exudate.  Eyes: Conjunctivae are normal. Right eye exhibits no discharge. Left eye exhibits no discharge. No scleral icterus.  Neck: Normal range of motion. Neck supple.  Cardiovascular: Normal rate, regular rhythm, normal heart sounds and intact distal pulses.   Pulmonary/Chest: Effort normal and breath sounds normal. No respiratory distress. No wheezes. No rales.  Abdominal: Soft. Bowel sounds are normal. Exhibits no distension and no mass. There is no tenderness.  Musculoskeletal: Normal range of motion. Exhibits no edema.  Lymphadenopathy:    No cervical adenopathy.  Neurological: Alert and oriented to person, place, and time. Exhibits normal muscle tone. Gait normal.  Coordination normal.  Skin: Skin is warm and dry. No rash noted. Not diaphoretic. No erythema. No pallor.  Psychiatric: Mood, memory and judgment normal.  Vitals reviewed.  LABORATORY DATA: Lab Results  Component Value Date   WBC 7.1 12/27/2017   HGB 11.4 (L) 12/27/2017   HCT 34.6 (L) 12/27/2017   MCV 93.3 12/27/2017   PLT 130 (L) 12/27/2017      Chemistry      Component Value Date/Time   NA 140 12/27/2017 0840   NA 141 04/30/2017 1541   K 4.2 12/27/2017 0840   K 4.1 04/30/2017 1541   CL 108 12/27/2017 0840   CO2 26 12/27/2017 0840   CO2 26 04/30/2017 1541   BUN 17 12/27/2017 0840   BUN 14.1 04/30/2017 1541   CREATININE 0.73 12/27/2017 0840   CREATININE 0.8 04/30/2017 1541      Component Value Date/Time   CALCIUM 8.8 (L) 12/27/2017 0840   CALCIUM 9.6 04/30/2017 1541   ALKPHOS 113 12/27/2017 0840   ALKPHOS 105 04/30/2017 1541   AST 31 12/27/2017 0840   AST 40 (H) 04/30/2017 1541   ALT 28 12/27/2017 0840   ALT 27 04/30/2017 1541   BILITOT 0.4 12/27/2017 0840   BILITOT 0.55 04/30/2017 1541       RADIOGRAPHIC STUDIES:  No results found.   ASSESSMENT/PLAN:  Non-small cell carcinoma of lung, stage 4 ([place]) This is a very pleasant 67 year old white female with a stage IV non-small cell lung cancer, adenocarcinoma with positive EGFR mutation in exon 21 (L858R) diagnosed in May 2015 status post treatment with Tarceva for 13 months discontinued secondary to disease progression and development of T790M resistant mutation. The patient is currently undergoing treatment with Tagrisso 80 mg by mouth daily status post 33 months. The patient has been tolerating this treatment well with no concerning complaints. Repeat CT scan of the chest, abdomen and pelvis was performed recently which showed evidence for disease progression with enlarging left upper lobe suprahilar mass with increased bronchial occlusion as well as new pulmonary nodules in the right lower lobe and progressive  liver disease.  Guardant 360 testing was performed recently which showed a persistent T7 70 M resistant mutation and no development of any other actionable mutations.  She is status post palliative radiation to the left upper lobe lung nodule. She remains on Tagrisso and is tolerating it well.  Recommend she continue on Tagrisso.  She will have a restaging CT scan of the chest, abdomen, pelvis in approximately 1 month and will follow-up 1 to 2 days after the CT scan to discuss the results.  For her cough, she was advised to use a cough suppressant such as Delsym.  She was advised to call immediately if she has any concerning symptoms in the interval. The patient voices understanding of current disease status and treatment options and is in agreement with the current care plan. All questions were answered. The patient knows to call the clinic with any problems, questions or concerns. We can certainly see the patient much sooner if necessary.   Orders Placed This Encounter  Procedures  . CT ABDOMEN PELVIS W CONTRAST    Standing Status:   Future    Standing Expiration Date:   12/28/2018    Order Specific Question:   If indicated for the ordered procedure, I authorize the administration of contrast media per Radiology protocol    Answer:   Yes    Order Specific Question:   Preferred imaging location?    Answer:   [facility]    Order Specific Question:   Radiology Contrast Protocol - do NOT remove file path    Answer:   \\[email or web address]    Order Specific Question:   ** REASON FOR EXAM (FREE TEXT)    Answer:   Lung cancer. Restaging.  . CT CHEST W CONTRAST    Standing Status:   Future    Standing Expiration Date:   12/28/2018    Order Specific Question:   If indicated for the ordered procedure, I authorize the administration of contrast media per Radiology protocol    Answer:   Yes    Order Specific Question:   Preferred imaging location?    Answer:   [facility]    Order Specific Question:   Radiology Contrast Protocol - do NOT remove file path    Answer:   \\[email or web address]    Order Specific Question:   ** REASON FOR EXAM (FREE TEXT)    Answer:   Lung cancer. Restaging.  Marland Kitchen CBC with Differential (Cancer Center Only)    Standing Status:   Future    Standing Expiration Date:   12/28/2018  . CMP ([place] only)    Standing Status:   Future    Standing Expiration Date:   12/28/2018   Mikey Bussing, DNP,  AGPCNP-BC, AOCNP 12/27/17

## 2017-12-27 NOTE — Telephone Encounter (Signed)
Scheduled appt per 8/1 los - gave patient AVS and calender per los.

## 2018-01-04 ENCOUNTER — Other Ambulatory Visit: Payer: Self-pay

## 2018-01-04 ENCOUNTER — Ambulatory Visit
Admission: RE | Admit: 2018-01-04 | Discharge: 2018-01-04 | Disposition: A | Discharge status: Home / Self Care | Payer: Medicare Other | Source: Ambulatory Visit | Attending: Urology | Admitting: Urology

## 2018-01-04 VITALS — BP 134/62 | HR 73 | Temp 97.7°F | Resp 20 | Ht 66.0 in | Wt 213.6 lb

## 2018-01-04 DIAGNOSIS — C3412 Malignant neoplasm of upper lobe, left bronchus or lung: Secondary | ICD-10-CM | POA: Insufficient documentation

## 2018-01-04 DIAGNOSIS — Z9221 Personal history of antineoplastic chemotherapy: Secondary | ICD-10-CM | POA: Insufficient documentation

## 2018-01-04 DIAGNOSIS — C779 Secondary and unspecified malignant neoplasm of lymph node, unspecified: Secondary | ICD-10-CM | POA: Diagnosis not present

## 2018-01-04 DIAGNOSIS — Z923 Personal history of irradiation: Secondary | ICD-10-CM | POA: Diagnosis not present

## 2018-01-04 DIAGNOSIS — C787 Secondary malignant neoplasm of liver and intrahepatic bile duct: Secondary | ICD-10-CM | POA: Diagnosis not present

## 2018-01-04 DIAGNOSIS — C3492 Malignant neoplasm of unspecified part of left bronchus or lung: Secondary | ICD-10-CM

## 2018-01-04 NOTE — Progress Notes (Signed)
Radiation Oncology         [phone] ________________________________  Name: Debra Hodges MRN: [id number]  Date: 01/04/2018  DOB: 10/09/1950  Post Treatment Note  CC: System, Pcp Not In  Curt Bears, MD  Diagnosis:   67 year-old female with Stage IV (T1b, N3, M1b) EGFR+ adenocarcinoma of the left upper lung.  Interval Since Last Radiation:  4 weeks  11/19/2017-12/03/2017:   The primary LUL lesion and involved mediastinal adenopathy were treated to 30 Gy in 10 fractions of 3 Gy.  02/21/2017 - 03/02/2017:The 2 liver lesions were treated to 50 Gy in 5 fractions of 10 Gy.  Narrative:  The patient returns today for routine follow-up.  She tolerated radiation treatment relatively well.  At the beginning of treatment, she endorsed nausea associated with her systemic chemotherapy-Tagrisso. Throughout treatment she endorsed fatigue and SOB with exertion which remained stable and by the end of treatment she developed mild dysphagia. She denied any skin changes.                              In summary, she was originally diagnosed with Stage IV NSCLC, adenocarcinoma of the left upper lobe with left hilar and bilateral mediastinal lymphadenopathy as well as liver metastasis in May 2015. She received Tarceva 150 mg by mouth daily starting 11/25/2013, status post 5 months of treatment, discontinued secondary to intolerance with persistent paronychia. 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 She was switched to Tagrisso 80 mg by mouth daily with first dose given 03/04/2015, now status post 56month of treatment and had been tolerating this treatment very well. The plan is for her to continue [place] repeat disease staging imaging in September, under the care and direction of Dr. MJulien Nordmann  On review of systems, the patient states that she is doing well  overall.  She continues with fatigue as well as a productive cough, particularly first thing in the morning- persistent since start of radiotherapy and not progressively worsening.  She denies any yellow/green coloration to the mucus, hemoptysis, chest pain, shortness of breath, fever, chills, nausea, vomiting or diarrhea.  She is using Delsym cough syrup which does seem to slightly improve the cough.  She gets a mild pain beneath her left shoulder blade when coughing and with certain positioning- "feels like a pulled muscle".  The pain is not sharp and does not wake her from rest at night.  Likewise, the cough is not keeping her awake at night.  ALLERGIES:  is allergic to penicillins.  Meds: Current Outpatient Medications  Medication Sig Dispense Refill  . acetaminophen (TYLENOL) 325 MG tablet Take 650 mg by mouth every 6 (six) hours as needed. Reported on 06/15/2015    . calcium carbonate (OS-CAL) 600 MG TABS tablet Take 600 mg by mouth daily.     .Marland KitchenCLIMARA 0.1 MG/24HR patch 1 patch once a week.    . levothyroxine (SYNTHROID, LEVOTHROID) 50 MCG tablet Take 50 mcg by mouth daily.    . metroNIDAZOLE (METROCREAM) 0.75 % cream Apply 1 application topically daily. Reported on 11/22/2015    . Multiple Vitamins-Calcium (ONE-A-DAY WOMENS PO) Take 1 tablet by mouth daily.    .Marland Kitchenosimertinib mesylate (TAGRISSO) 80 MG tablet Take 80 mg by mouth daily.    . potassium chloride SA (K-DUR,KLOR-CON) 20 MEQ tablet 1 tablet 2 (  two) times daily.    . prochlorperazine (COMPAZINE) 10 MG tablet Take 1 tablet (10 mg total) by mouth every 6 (six) hours as needed for nausea or vomiting. 30 tablet 3  . Sodium Fluoride (CLINPRO 5000) 1.1 % PSTE Place 1 application onto teeth 3 (three) times daily.    Marland Kitchen spironolactone (ALDACTONE) 25 MG tablet 1 tablet daily.    . vitamin C (ASCORBIC ACID) 500 MG tablet Take 500 mg by mouth daily.    Marland Kitchen guaifenesin (ROBITUSSIN) 100 MG/5ML syrup Take 200 mg by mouth 3 (three) times daily as  needed for cough.    . loperamide (IMODIUM) 2 MG capsule Take by mouth as needed for diarrhea or loose stools. Reported on 11/22/2015     No current facility-administered medications for this encounter.     Physical Findings:  height is '5\' 6"'  (1.676 m) and weight is 213 lb 9.6 oz (96.9 kg). Her oral temperature is 97.7 F (36.5 C). Her blood pressure is 134/62 and her pulse is 73. Her respiration is 20 and oxygen saturation is 100%.  Pain Assessment Pain Score: 2  Pain Frequency: Intermittent Pain Loc: Back(left side taking Tylenol)/10 In general this is a well appearing Caucasian female in no acute distress.  She's alert and oriented x4 and appropriate throughout the examination. Cardiopulmonary assessment is negative for acute distress and she exhibits normal effort.  Lungs are clear to auscultation bilaterally without wheezing, rales or rhonchi.  Normal heart rate/rhythm without rubs, murmurs or gallops.  Lab Findings: Lab Results  Component Value Date   WBC 7.1 12/27/2017   HGB 11.4 (L) 12/27/2017   HCT 34.6 (L) 12/27/2017   MCV 93.3 12/27/2017   PLT 130 (L) 12/27/2017     Radiographic Findings: No results found.  Impression/Plan: 22. 67 year-old female with Stage IV (T1b, N3, M1b) EGFR+ adenocarcinoma of the left upper lung. She appears to have recovered well from the effects of radiotherapy.  She will continue to use Delsym prn cough.  At this point, we discussed that while we are happy to continue to participate in her care if clinically indicated, we will plan to see her back on an as-needed basis.  She plans to continue on systemic therapy with Tagrisso under the care and direction of Dr. Earlie Server and is scheduled for follow-up systemic imaging for disease restaging in September 2019 to determine further recommendations for treatment.  She knows to call at anytime with any questions or concerns related to her previous radiotherapy.    Nicholos Johns, PA-C

## 2018-01-04 NOTE — Addendum Note (Signed)
Encounter addended by: Freeman Caldron, PA-C on: 01/04/2018 11:52 AM  Actions taken: Sign clinical note

## 2018-01-08 ENCOUNTER — Encounter: Payer: Self-pay | Admitting: Urology

## 2018-01-08 NOTE — Addendum Note (Signed)
Encounter addended by: Malena Edman, RN on: 01/08/2018 10:37 AM  Actions taken: Medication List reviewed, Problem List reviewed, Allergies reviewed

## 2018-01-17 ENCOUNTER — Telehealth: Payer: Self-pay | Admitting: Internal Medicine

## 2018-01-17 NOTE — Telephone Encounter (Signed)
LVM for pt regarding upcoming appt updates per 8/21 sch message.

## 2018-01-24 ENCOUNTER — Other Ambulatory Visit: Payer: Self-pay | Admitting: *Deleted

## 2018-01-24 MED ORDER — OSIMERTINIB MESYLATE 80 MG PO TABS: 80.0000 mg | ORAL_TABLET | Freq: Every day | ORAL | 0 refills | Status: DC

## 2018-01-24 NOTE — Telephone Encounter (Signed)
Pt called, request for Tagrisso refill. Fax to [facility]

## 2018-01-30 ENCOUNTER — Other Ambulatory Visit: Payer: Self-pay | Admitting: Medical Oncology

## 2018-01-30 ENCOUNTER — Telehealth: Payer: Self-pay | Admitting: Medical Oncology

## 2018-01-30 DIAGNOSIS — C3492 Malignant neoplasm of unspecified part of left bronchus or lung: Secondary | ICD-10-CM

## 2018-01-30 MED ORDER — OSIMERTINIB MESYLATE 80 MG PO TABS: 80.0000 mg | ORAL_TABLET | Freq: Every day | ORAL | 1 refills | Status: DC

## 2018-01-30 NOTE — Addendum Note (Signed)
Addended by: Ardeen Garland on: 01/30/2018 04:22 PM   Modules accepted: Orders

## 2018-01-30 NOTE — Addendum Note (Signed)
Addended by: Ardeen Garland on: 01/30/2018 04:21 PM   Modules accepted: Orders

## 2018-01-30 NOTE — Telephone Encounter (Signed)
Tagriso refill needed. Went to wrong pharmacy . Faxed to [facility].

## 2018-01-30 NOTE — Addendum Note (Signed)
Addended by: Ardeen Garland on: 01/30/2018 04:30 PM   Modules accepted: Orders

## 2018-02-01 ENCOUNTER — Telehealth: Payer: Self-pay | Admitting: Medical Oncology

## 2018-02-01 NOTE — Telephone Encounter (Signed)
Did pt get Tagrisso?

## 2018-02-08 ENCOUNTER — Other Ambulatory Visit: Payer: Medicare Other

## 2018-02-08 ENCOUNTER — Ambulatory Visit (HOSPITAL_COMMUNITY)
Admission: RE | Admit: 2018-02-08 | Discharge: 2018-02-08 | Disposition: A | Discharge status: Home / Self Care | Payer: Medicare Other | Source: Ambulatory Visit | Attending: Oncology | Admitting: Oncology

## 2018-02-08 ENCOUNTER — Inpatient Hospital Stay: Payer: Medicare Other | Attending: Internal Medicine

## 2018-02-08 DIAGNOSIS — Z5112 Encounter for antineoplastic immunotherapy: Secondary | ICD-10-CM | POA: Insufficient documentation

## 2018-02-08 DIAGNOSIS — C787 Secondary malignant neoplasm of liver and intrahepatic bile duct: Secondary | ICD-10-CM | POA: Diagnosis not present

## 2018-02-08 DIAGNOSIS — C778 Secondary and unspecified malignant neoplasm of lymph nodes of multiple regions: Secondary | ICD-10-CM | POA: Insufficient documentation

## 2018-02-08 DIAGNOSIS — C3492 Malignant neoplasm of unspecified part of left bronchus or lung: Secondary | ICD-10-CM

## 2018-02-08 DIAGNOSIS — Z5111 Encounter for antineoplastic chemotherapy: Secondary | ICD-10-CM | POA: Diagnosis not present

## 2018-02-08 DIAGNOSIS — M5136 Other intervertebral disc degeneration, lumbar region: Secondary | ICD-10-CM | POA: Insufficient documentation

## 2018-02-08 DIAGNOSIS — K7689 Other specified diseases of liver: Secondary | ICD-10-CM | POA: Diagnosis not present

## 2018-02-08 DIAGNOSIS — C3412 Malignant neoplasm of upper lobe, left bronchus or lung: Secondary | ICD-10-CM | POA: Diagnosis not present

## 2018-02-08 DIAGNOSIS — Z5189 Encounter for other specified aftercare: Secondary | ICD-10-CM | POA: Diagnosis not present

## 2018-02-08 DIAGNOSIS — R918 Other nonspecific abnormal finding of lung field: Secondary | ICD-10-CM | POA: Diagnosis not present

## 2018-02-08 DIAGNOSIS — Z79899 Other long term (current) drug therapy: Secondary | ICD-10-CM | POA: Insufficient documentation

## 2018-02-08 LAB — CMP (CANCER CENTER ONLY)
ALBUMIN: 3.6 g/dL (ref 3.5–5.0)
ALT: 16 U/L (ref 0–44)
AST: 26 U/L (ref 15–41)
Alkaline Phosphatase: 149 U/L — ABNORMAL HIGH (ref 38–126)
Anion gap: 8 (ref 5–15)
BUN: 15 mg/dL (ref 8–23)
CO2: 28 mmol/L (ref 22–32)
CREATININE: 0.73 mg/dL (ref 0.44–1.00)
Calcium: 9.5 mg/dL (ref 8.9–10.3)
Chloride: 105 mmol/L (ref 98–111)
GFR, Est AFR Am: >60 mL/min (ref >60)
GFR, Estimated: >60 mL/min (ref >60)
GLUCOSE: 84 mg/dL (ref 70–99)
Potassium: 4.2 mmol/L (ref 3.5–5.1)
Sodium: 141 mmol/L (ref 135–145)
Total Bilirubin: 0.6 mg/dL (ref 0.3–1.2)
Total Protein: 7.7 g/dL (ref 6.5–8.1)

## 2018-02-08 LAB — CBC WITH DIFFERENTIAL (CANCER CENTER ONLY)
Basophils Absolute: 0 10*3/uL (ref 0.0–0.1)
Basophils Relative: 0 %
Eosinophils Absolute: 0.1 10*3/uL (ref 0.0–0.5)
Eosinophils Relative: 2 %
HCT: 34.3 % — ABNORMAL LOW (ref 34.8–46.6)
Hemoglobin: 11.4 g/dL — ABNORMAL LOW (ref 11.6–15.9)
LYMPHS ABS: 1.4 10*3/uL (ref 0.9–3.3)
LYMPHS PCT: 22 %
MCH: 30.7 pg (ref 25.1–34.0)
MCHC: 33.2 g/dL (ref 31.5–36.0)
MCV: 92.5 fL (ref 79.5–101.0)
MONO ABS: 0.7 10*3/uL (ref 0.1–0.9)
Monocytes Relative: 11 %
Neutro Abs: 4.1 10*3/uL (ref 1.5–6.5)
Neutrophils Relative %: 65 %
PLATELETS: 118 10*3/uL — AB (ref 145–400)
RBC: 3.71 MIL/uL (ref 3.70–5.45)
RDW: 14 % (ref 11.2–14.5)
WBC Count: 6.3 10*3/uL (ref 3.9–10.3)

## 2018-02-08 MED ORDER — IOHEXOL 300 MG/ML  SOLN
100.0000 mL | Freq: Once | INTRAMUSCULAR | Status: AC | PRN
  Administered 2018-02-08: 100 mL via INTRAVENOUS

## 2018-02-11 ENCOUNTER — Other Ambulatory Visit: Payer: Self-pay | Admitting: Internal Medicine

## 2018-02-11 ENCOUNTER — Encounter: Payer: Self-pay | Admitting: Internal Medicine

## 2018-02-11 ENCOUNTER — Inpatient Hospital Stay (HOSPITAL_BASED_OUTPATIENT_CLINIC_OR_DEPARTMENT_OTHER): Payer: Medicare Other | Admitting: Internal Medicine

## 2018-02-11 ENCOUNTER — Telehealth: Payer: Self-pay | Admitting: Internal Medicine

## 2018-02-11 VITALS — BP 149/62 | HR 84 | Temp 97.6°F | Resp 20 | Ht 66.0 in | Wt 215.4 lb

## 2018-02-11 DIAGNOSIS — C787 Secondary malignant neoplasm of liver and intrahepatic bile duct: Secondary | ICD-10-CM | POA: Diagnosis not present

## 2018-02-11 DIAGNOSIS — Z5111 Encounter for antineoplastic chemotherapy: Secondary | ICD-10-CM | POA: Diagnosis not present

## 2018-02-11 DIAGNOSIS — Z79899 Other long term (current) drug therapy: Secondary | ICD-10-CM

## 2018-02-11 DIAGNOSIS — M5136 Other intervertebral disc degeneration, lumbar region: Secondary | ICD-10-CM | POA: Diagnosis not present

## 2018-02-11 DIAGNOSIS — C778 Secondary and unspecified malignant neoplasm of lymph nodes of multiple regions: Secondary | ICD-10-CM | POA: Diagnosis not present

## 2018-02-11 DIAGNOSIS — C3492 Malignant neoplasm of unspecified part of left bronchus or lung: Secondary | ICD-10-CM

## 2018-02-11 DIAGNOSIS — Z7189 Other specified counseling: Secondary | ICD-10-CM

## 2018-02-11 DIAGNOSIS — C3412 Malignant neoplasm of upper lobe, left bronchus or lung: Secondary | ICD-10-CM | POA: Diagnosis not present

## 2018-02-11 DIAGNOSIS — R5382 Chronic fatigue, unspecified: Secondary | ICD-10-CM

## 2018-02-11 DIAGNOSIS — Z5189 Encounter for other specified aftercare: Secondary | ICD-10-CM | POA: Diagnosis not present

## 2018-02-11 DIAGNOSIS — Z5112 Encounter for antineoplastic immunotherapy: Secondary | ICD-10-CM

## 2018-02-11 MED ORDER — PROCHLORPERAZINE MALEATE 10 MG PO TABS: 10.0000 mg | ORAL_TABLET | Freq: Four times a day (QID) | ORAL | 3 refills | Status: DC | PRN

## 2018-02-11 MED ORDER — OXYCODONE-ACETAMINOPHEN 5-325 MG PO TABS: 1.0000 | ORAL_TABLET | Freq: Four times a day (QID) | ORAL | 0 refills | Status: DC | PRN

## 2018-02-11 NOTE — Progress Notes (Signed)
[place] [phone]   [phone]  OFFICE PROGRESS NOTE  System, Pcp Not In No address on file  DIAGNOSIS: Stage IV (T1b, N3, M1b) non-small cell lung cancer, adenocarcinoma with positive EGFR mutation in exon 21 (L858R) and recently found to have T790M resistant mutation, presented with left upper lobe lung mass in addition to left hilar and bilateral mediastinal lymphadenopathy as well as liver metastasis diagnosed in May of 2015.  PRIOR THERAPY: 1) Tarceva 150 mg by mouth daily. Started 11/25/2013. Status post 5 months of treatment discontinued today secondary to intolerance with persistent paronychia. 2) Tarceva 100 mg by mouth daily. Started 06/03/2014. Status post 8 months of treatment.  3) stereotactic radiotherapy to liver lesions under the care of Dr. Tammi Klippel. Last dose 03/02/2017 4) Tagrisso 80 mg by mouth daily status post 34 months of treatment. First dose was given 03/04/2015.  This was discontinued secondary to disease progression.   CURRENT THERAPY: Systemic chemotherapy with carboplatin for AUC of 6, paclitaxel 200 mg/M2, Avastin 15 mg/KG and Tecentriq 1200 mg IV every 3 weeks with Neulasta support.  First dose 02/21/2018.  INTERVAL HISTORY: Debra Hodges 67 y.o. female follow-up visit accompanied by her husband.  The patient is feeling fine today with no specific complaints except for fatigue and low back pain.  She denied having any current chest pain but continues to have mild cough and shortness of breath with exertion with no hemoptysis.  She denied having any fever or chills.  She has no nausea, vomiting, diarrhea or constipation.  She denied having any recent weight loss or night sweats.  She has no headache or visual changes.  She has been tolerating her treatment with Tagrisso fairly well.  The patient had repeat CT scan of the chest, abdomen and pelvis performed recently.  She is here today for evaluation and discussion of her  risk her results.   MEDICAL HISTORY: Past Medical History:  Diagnosis Date  . High blood pressure   . Liver lesion 06/26/2016  . Non-small cell carcinoma of lung, stage 4 ([place]) dx'd 10/2013    ALLERGIES:  is allergic to penicillins.  MEDICATIONS:  Current Outpatient Medications  Medication Sig Dispense Refill  . acetaminophen (TYLENOL) 325 MG tablet Take 650 mg by mouth every 6 (six) hours as needed. Reported on 06/15/2015    . calcium carbonate (OS-CAL) 600 MG TABS tablet Take 600 mg by mouth daily.     Marland Kitchen CLIMARA 0.1 MG/24HR patch 1 patch once a week.    Marland Kitchen guaifenesin (ROBITUSSIN) 100 MG/5ML syrup Take 200 mg by mouth 3 (three) times daily as needed for cough.    . levothyroxine (SYNTHROID, LEVOTHROID) 50 MCG tablet Take 50 mcg by mouth daily.    . metroNIDAZOLE (METROCREAM) 0.75 % cream Apply 1 application topically daily. Reported on 11/22/2015    . Multiple Vitamins-Calcium (ONE-A-DAY WOMENS PO) Take 1 tablet by mouth daily.    Marland Kitchen osimertinib mesylate (TAGRISSO) 80 MG tablet Take 1 tablet (80 mg total) by mouth daily. 30 tablet 1  . potassium chloride SA (K-DUR,KLOR-CON) 20 MEQ tablet 1 tablet 2 (two) times daily.    . Sodium Fluoride (CLINPRO 5000) 1.1 % PSTE Place 1 application onto teeth 3 (three) times daily.    Marland Kitchen spironolactone (ALDACTONE) 25 MG tablet 1 tablet daily.    . vitamin C (ASCORBIC ACID) 500 MG tablet Take 500 mg by mouth daily.    Marland Kitchen loperamide (IMODIUM) 2 MG capsule Take  by mouth as needed for diarrhea or loose stools. Reported on 11/22/2015    . prochlorperazine (COMPAZINE) 10 MG tablet Take 1 tablet (10 mg total) by mouth every 6 (six) hours as needed for nausea or vomiting. (Patient not taking: Reported on 02/11/2018) 30 tablet 3   No current facility-administered medications for this visit.     SURGICAL HISTORY:  Past Surgical History:  Procedure Laterality Date  . ABDOMINAL HYSTERECTOMY    . BREAST BIOPSY     Right breast  . VIDEO BRONCHOSCOPY Bilateral  10/27/2013   Procedure: VIDEO BRONCHOSCOPY WITH FLUORO;  Surgeon: Collene Gobble, MD;  Location: [facility];  Service: Cardiopulmonary;  Laterality: Bilateral;    REVIEW OF SYSTEMS:  Constitutional: positive for fatigue Eyes: negative Ears, nose, mouth, throat, and face: negative Respiratory: positive for cough and dyspnea on exertion Cardiovascular: negative Gastrointestinal: negative Genitourinary:negative Integument/breast: negative Hematologic/lymphatic: negative Musculoskeletal:positive for back pain Neurological: negative Behavioral/Psych: negative Endocrine: negative Allergic/Immunologic: negative   PHYSICAL EXAMINATION: General appearance: alert, cooperative, fatigued and no distress Head: Normocephalic, without obvious abnormality, atraumatic Neck: no adenopathy, no JVD, supple, symmetrical, trachea midline and thyroid not enlarged, symmetric, no tenderness/mass/nodules Lymph nodes: Cervical, supraclavicular, and axillary nodes normal. Resp: clear to auscultation bilaterally Back: symmetric, no curvature. ROM normal. No CVA tenderness. Cardio: regular rate and rhythm, S1, S2 normal, no murmur, click, rub or gallop GI: soft, non-tender; bowel sounds normal; no masses,  no organomegaly Extremities: extremities normal, atraumatic, no cyanosis or edema Neurologic: Alert and oriented X 3, normal strength and tone. Normal symmetric reflexes. Normal coordination and gait  ECOG PERFORMANCE STATUS: 1 - Symptomatic but completely ambulatory  Blood pressure (!) 149/62, pulse 84, temperature 97.6 F (36.4 C), temperature source Oral, resp. rate 20, height _0  (1.676 m), weight 215 lb 6.4 oz (97.7 kg), SpO2 97 %.  LABORATORY DATA: Lab Results  Component Value Date   WBC 6.3 02/08/2018   HGB 11.4 (L) 02/08/2018   HCT 34.3 (L) 02/08/2018   MCV 92.5 02/08/2018   PLT 118 (L) 02/08/2018      Chemistry      Component Value Date/Time   NA 141 02/08/2018 1432   NA 141  04/30/2017 1541   K 4.2 02/08/2018 1432   K 4.1 04/30/2017 1541   CL 105 02/08/2018 1432   CO2 28 02/08/2018 1432   CO2 26 04/30/2017 1541   BUN 15 02/08/2018 1432   BUN 14.1 04/30/2017 1541   CREATININE 0.73 02/08/2018 1432   CREATININE 0.8 04/30/2017 1541      Component Value Date/Time   CALCIUM 9.5 02/08/2018 1432   CALCIUM 9.6 04/30/2017 1541   ALKPHOS 149 (H) 02/08/2018 1432   ALKPHOS 105 04/30/2017 1541   AST 26 02/08/2018 1432   AST 40 (H) 04/30/2017 1541   ALT 16 02/08/2018 1432   ALT 27 04/30/2017 1541   BILITOT 0.6 02/08/2018 1432   BILITOT 0.55 04/30/2017 1541       RADIOGRAPHIC STUDIES: Ct Chest W Contrast  Result Date: 02/08/2018 CLINICAL DATA:  Patient with history of metastatic lung cancer. Back pain. Sacral pain. Follow-up exam. EXAM: CT CHEST, ABDOMEN, AND PELVIS WITH CONTRAST TECHNIQUE: Multidetector CT imaging of the chest, abdomen and pelvis was performed following the standard protocol during bolus administration of intravenous contrast. CONTRAST:  173m OMNIPAQUE IOHEXOL 300 MG/ML  SOLN COMPARISON:  CT CAP 11/08/2017 FINDINGS: CT CHEST FINDINGS Cardiovascular: Normal heart size. Trace fluid superior pericardial recess. Thoracic aortic vascular calcifications. Mediastinum/Nodes: Interval increase in  size of 1.0 cm left paraesophageal lymph node (image 29; series 2), previously 0.8 cm. Lungs/Pleura: Central airways are patent. New small left pleural effusion. Interval decrease in size of left suprahilar mass measuring approximately 2.6 x 2.0 cm (image 18; series 2), previously 3.2 x 3.7 cm. Masslike density along the fissure within the lingula is slightly increased in size when compared to prior exam measuring approximately 2.3 Cm in thickness (image 63; series 4). Interval development of patchy ground-glass nodularity within the left upper lobe. Interval increase in size of nodule within the lingula measuring 1.6 cm (image 67; series 4), previously 0.9 cm. Interval  development of multiple predominately ground-glass nodules throughout the left lower lobe measuring up to 1.7 x 1.2 cm (image 50; series 4). Similar 6 mm left lower lobe nodule (image 78; series 4). Interval development of peripheral subpleural masslike consolidation right lower lobe measuring 3.4 x 1.4 cm (image 83; series 4) with peripheral ground-glass attenuation. Interval increase in right lower lobe nodule measuring 4 mm (image 72; series 4), previously 2 mm. No pneumothorax. Musculoskeletal: Thoracic spine degenerative changes. Interval increase in size of 1.5 cm sclerotic lesion within the proximal left humerus (image 1; series 4), previously 0.7 cm. CT ABDOMEN PELVIS FINDINGS Hepatobiliary: Interval increase in size of multiple lesions throughout the left and right hepatic lobes. Reference lesion within the hepatic dome measures 2.2 x 2.5 cm (image 38; series 2), previously 0.9 x 0.8 cm. Reference lesion within the left hepatic lobe measures 3.0 x 2.9 cm (image 42; series 2), previously 1.3 x 1.4 cm. Gallbladder is unremarkable. New 2.3 x 2.2 cm lesion adjacent to the gallbladder fossa (image 47; series 2). Pancreas: Unremarkable Spleen: Stable 9 mm low-attenuation lesion in the spleen (image 49; series 2). Adrenals/Urinary Tract: Normal adrenal glands. Kidneys enhance symmetrically with contrast. Left-sided nephrolithiasis with a stone in the interpolar region left kidney measuring up to 6 mm (image 60; series 2). 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 Urinary bladder is unremarkable. Stomach/Bowel: Large amount of stool throughout the colon. Small hiatal hernia. Normal morphology of the stomach. No evidence for small bowel obstruction. No free fluid or free intraperitoneal air. Vascular/Lymphatic: Normal caliber abdominal aorta. Peripheral calcified atherosclerotic plaque. No retroperitoneal lymphadenopathy. Reproductive: Status post hysterectomy. Other: None.  Musculoskeletal: Lumbar spine degenerative changes. No aggressive or acute appearing osseous lesions. IMPRESSION: 1. New large area of peripheral consolidation with surrounding ground-glass opacity right lower lobe which may be infectious/inflammatory in etiology. Metastatic disease not excluded. Possibility of pulmonary infarct in the setting of pulmonary embolus not entirely excluded. Consider dedicated evaluation with CTA chest as clinically indicated. 2. Interval decrease in size of left suprahilar mass. 3. Interval increase in bandlike consolidative opacity within the lingula adjacent to the fissure. Interval increase in size of adjacent nodularity. 4. Interval development of multiple predominately ground-glass nodules within the left upper and left lower lobes which may be infectious/inflammatory or metastatic in etiology. 5. Interval increase in size and development of new hepatic lesions compatible with metastatic disease. 6. Increasing sclerotic lesion within the proximal left humerus concerning for osseous metastatic disease. 7. These results will be called to the ordering clinician or representative by the Radiologist Assistant, and communication documented in the PACS or zVision Dashboard. Electronically Signed   By: Lovey Newcomer M.D.   On: 02/08/2018 16:59   Ct Abdomen Pelvis W Contrast  Result Date: 02/08/2018 CLINICAL DATA:  Patient with history of metastatic lung cancer. Back pain. Sacral  pain. Follow-up exam. EXAM: CT CHEST, ABDOMEN, AND PELVIS WITH CONTRAST TECHNIQUE: Multidetector CT imaging of the chest, abdomen and pelvis was performed following the standard protocol during bolus administration of intravenous contrast. CONTRAST:  122m OMNIPAQUE IOHEXOL 300 MG/ML  SOLN COMPARISON:  CT CAP 11/08/2017 FINDINGS: CT CHEST FINDINGS Cardiovascular: Normal heart size. Trace fluid superior pericardial recess. Thoracic aortic vascular calcifications. Mediastinum/Nodes: Interval increase in size of 1.0  cm left paraesophageal lymph node (image 29; series 2), previously 0.8 cm. Lungs/Pleura: Central airways are patent. New small left pleural effusion. Interval decrease in size of left suprahilar mass measuring approximately 2.6 x 2.0 cm (image 18; series 2), previously 3.2 x 3.7 cm. Masslike density along the fissure within the lingula is slightly increased in size when compared to prior exam measuring approximately 2.3 Cm in thickness (image 63; series 4). Interval development of patchy ground-glass nodularity within the left upper lobe. Interval increase in size of nodule within the lingula measuring 1.6 cm (image 67; series 4), previously 0.9 cm. Interval development of multiple predominately ground-glass nodules throughout the left lower lobe measuring up to 1.7 x 1.2 cm (image 50; series 4). Similar 6 mm left lower lobe nodule (image 78; series 4). Interval development of peripheral subpleural masslike consolidation right lower lobe measuring 3.4 x 1.4 cm (image 83; series 4) with peripheral ground-glass attenuation. Interval increase in right lower lobe nodule measuring 4 mm (image 72; series 4), previously 2 mm. No pneumothorax. Musculoskeletal: Thoracic spine degenerative changes. Interval increase in size of 1.5 cm sclerotic lesion within the proximal left humerus (image 1; series 4), previously 0.7 cm. CT ABDOMEN PELVIS FINDINGS Hepatobiliary: Interval increase in size of multiple lesions throughout the left and right hepatic lobes. Reference lesion within the hepatic dome measures 2.2 x 2.5 cm (image 38; series 2), previously 0.9 x 0.8 cm. Reference lesion within the left hepatic lobe measures 3.0 x 2.9 cm (image 42; series 2), previously 1.3 x 1.4 cm. Gallbladder is unremarkable. New 2.3 x 2.2 cm lesion adjacent to the gallbladder fossa (image 47; series 2). Pancreas: Unremarkable Spleen: Stable 9 mm low-attenuation lesion in the spleen (image 49; series 2). Adrenals/Urinary Tract: Normal adrenal glands.  Kidneys enhance symmetrically with contrast. Left-sided nephrolithiasis with a stone in the interpolar region left kidney measuring up to 6 mm (image 60; series 2). 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 Urinary bladder is unremarkable. Stomach/Bowel: Large amount of stool throughout the colon. Small hiatal hernia. Normal morphology of the stomach. No evidence for small bowel obstruction. No free fluid or free intraperitoneal air. Vascular/Lymphatic: Normal caliber abdominal aorta. Peripheral calcified atherosclerotic plaque. No retroperitoneal lymphadenopathy. Reproductive: Status post hysterectomy. Other: None. Musculoskeletal: Lumbar spine degenerative changes. No aggressive or acute appearing osseous lesions. IMPRESSION: 1. New large area of peripheral consolidation with surrounding ground-glass opacity right lower lobe which may be infectious/inflammatory in etiology. Metastatic disease not excluded. Possibility of pulmonary infarct in the setting of pulmonary embolus not entirely excluded. Consider dedicated evaluation with CTA chest as clinically indicated. 2. Interval decrease in size of left suprahilar mass. 3. Interval increase in bandlike consolidative opacity within the lingula adjacent to the fissure. Interval increase in size of adjacent nodularity. 4. Interval development of multiple predominately ground-glass nodules within the left upper and left lower lobes which may be infectious/inflammatory or metastatic in etiology. 5. Interval increase in size and development of new hepatic lesions compatible with metastatic disease. 6. Increasing sclerotic lesion within the proximal left humerus  concerning for osseous metastatic disease. 7. These results will be called to the ordering clinician or representative by the Radiologist Assistant, and communication documented in the PACS or zVision Dashboard. Electronically Signed   By: Lovey Newcomer M.D.   On: 02/08/2018 16:59     ASSESSMENT AND PLAN:  This is a very pleasant 67 years old white female with a stage IV non-small cell lung cancer, adenocarcinoma with positive EGFR mutation in exon 21 (L858R) diagnosed in May 2015 status post treatment with Tarceva for 13 months discontinued secondary to disease progression and development of T790M resistant mutation. The patient is currently undergoing treatment with Tagrisso 80 mg by mouth daily status post 34 months. I also referred the patient to Dr. Tammi Klippel from radiation oncology for consideration of palliative radiotherapy to the enlarging left upper lobe suprahilar mass to avoid further obstruction of her bronchial tree. The patient has been tolerating her treatment with Tagrisso fairly well. Repeat CT scan of the chest, abdomen and pelvis showed findings for disease progression especially in the liver. I personally and independently reviewed the scan images and discussed the results with the patient and her husband today. I recommended for the patient to discontinue her current treatment with Tagrisso at this point because of the disease progression. I discussed with the patient other treatment options including palliative care and hospice versus consideration of palliative systemic chemotherapy with carboplatin for AUC of 6, paclitaxel 200 mg/M2, and Avastin 15 mg/KG and Tecentriq 1200 mg IV every 3 weeks.  The choice of this regimen was based on recent clinical trial with Impower 150 showing benefit for patient with metastatic non-small cell lung cancer with EGFR mutation and liver metastasis after failure of target therapy. The patient is interested in proceeding with systemic chemotherapy.  I discussed with her the adverse effect of this treatment including but not limited to alopecia, myelosuppression, nausea and vomiting, peripheral neuropathy, liver or renal dysfunction as well as the immunotherapy adverse effects including but not limited to immunotherapy mediated  skin rash, diarrhea, inflammation of the lung, kidney, liver, thyroid or other endocrine dysfunction. She is expected to start the first cycle of this treatment next week. For the low back pain this is likely secondary to degenerative disc disease seen on the recently scan, I will start the patient on Percocet 5/325 mg p.o. every 6 hours as needed. The patient will have a chemotherapy education class before starting the first dose of her treatment. She will come back for follow-up visit in 2 weeks for evaluation and management of any adverse effect of her treatment. The patient was advised to call immediately if she has any concerning symptoms in the interval. The patient voices understanding of current disease status and treatment options and is in agreement with the current care plan. All questions were answered. The patient knows to call the clinic with any problems, questions or concerns. We can certainly see the patient much sooner if necessary.  Disclaimer: This note was dictated with voice recognition software. Similar sounding words can inadvertently be transcribed and may not be corrected upon review.

## 2018-02-11 NOTE — Progress Notes (Signed)
START ON PATHWAY REGIMEN - Non-Small Cell Lung     A cycle is every 21 days:     Atezolizumab      Bevacizumab-xxxx      Paclitaxel      Carboplatin   **Always confirm dose/schedule in your pharmacy ordering system**  Patient Characteristics: Stage IV Metastatic, Nonsquamous, Initial Chemotherapy/Immunotherapy, PS = 0, 1, ALK Translocation Positive or EGFR Sensitizing Mutation - Common and Uncommon AJCC T Category: T1b Current Disease Status: Distant Metastases AJCC N Category: N3 AJCC M Category: M1c AJCC 8 Stage Grouping: IVB Histology: Nonsquamous Cell ROS1 Rearrangement Status: Negative T790M Mutation Status: Positive Following EGFR TKI Other Mutations/Biomarkers: No Other Actionable Mutations NTRK Gene Fusion Status: Negative PD-L1 Expression Status: Did Not Order Test Chemotherapy/Immunotherapy LOT: Initial Chemotherapy/Immunotherapy Molecular Targeted Therapy: Not Appropriate ALK Translocation Status: Negative EGFR Mutation Status: Sensitizing Common BRAF V600E Mutation Status: Negative Performance Status: PS = 0, 1 Intent of Therapy: Non-Curative / Palliative Intent, Discussed with Patient

## 2018-02-11 NOTE — Telephone Encounter (Signed)
Scheduled appt per 9/16 los - gave patient AVS and calender per los.  - unable to schedule first treatment on 9/26 due to cap - logged.

## 2018-02-14 ENCOUNTER — Telehealth: Payer: Self-pay | Admitting: Internal Medicine

## 2018-02-14 NOTE — Telephone Encounter (Signed)
Appointments scheduled and LMVM for patient with date/ time per 9/16 los

## 2018-02-15 ENCOUNTER — Inpatient Hospital Stay: Payer: Medicare Other

## 2018-02-19 ENCOUNTER — Telehealth: Payer: Self-pay

## 2018-02-19 NOTE — Telephone Encounter (Signed)
Oral Oncology Patient Advocate Encounter  [facility] have updated their system and are now having major issues. The patients and [place] here at the office are sitting on the phone between 1-3 hours to talk to someone.   When I got them on the phone today I requested refills for all of our [facility] patients and they will expedite the refill process and overnight Ms. Delk's Tagrisso.  I spoke to the patient and she verbalized understanding and appreciation. She also stated that she may be taken off of this medicine and will not need it.   [place] Patient [place] Phone [phone] Fax [phone]

## 2018-02-19 NOTE — Telephone Encounter (Signed)
She should stop her treatment with Tagrisso.  She is starting systemic chemotherapy.

## 2018-02-20 ENCOUNTER — Encounter: Payer: Self-pay | Admitting: Internal Medicine

## 2018-02-20 NOTE — Progress Notes (Signed)
Called pt to introduce myself as her Arboriculturist.  Pt has 2 insurances so copay assistance shouldn't be needed.  I offered the [employer] to help with personal bills and transportation while in treatment but pt declined.  I will meet her on 02/21/18 to give her my card in case her situation changes and she needs assistance in the future.

## 2018-02-21 ENCOUNTER — Inpatient Hospital Stay: Payer: Medicare Other

## 2018-02-21 VITALS — BP 148/83 | HR 83 | Temp 98.4°F | Resp 18

## 2018-02-21 DIAGNOSIS — C3492 Malignant neoplasm of unspecified part of left bronchus or lung: Secondary | ICD-10-CM

## 2018-02-21 DIAGNOSIS — C778 Secondary and unspecified malignant neoplasm of lymph nodes of multiple regions: Secondary | ICD-10-CM | POA: Diagnosis not present

## 2018-02-21 DIAGNOSIS — R5382 Chronic fatigue, unspecified: Secondary | ICD-10-CM

## 2018-02-21 DIAGNOSIS — Z5189 Encounter for other specified aftercare: Secondary | ICD-10-CM | POA: Diagnosis not present

## 2018-02-21 DIAGNOSIS — C787 Secondary malignant neoplasm of liver and intrahepatic bile duct: Secondary | ICD-10-CM | POA: Diagnosis not present

## 2018-02-21 DIAGNOSIS — C3412 Malignant neoplasm of upper lobe, left bronchus or lung: Secondary | ICD-10-CM | POA: Diagnosis not present

## 2018-02-21 DIAGNOSIS — Z5111 Encounter for antineoplastic chemotherapy: Secondary | ICD-10-CM | POA: Diagnosis not present

## 2018-02-21 DIAGNOSIS — Z5112 Encounter for antineoplastic immunotherapy: Secondary | ICD-10-CM | POA: Diagnosis not present

## 2018-02-21 LAB — CBC WITH DIFFERENTIAL (CANCER CENTER ONLY)
Basophils Absolute: 0 10*3/uL (ref 0.0–0.1)
Basophils Relative: 0 %
Eosinophils Absolute: 0.1 10*3/uL (ref 0.0–0.5)
Eosinophils Relative: 2 %
HCT: 37.6 % (ref 34.8–46.6)
Hemoglobin: 12.6 g/dL (ref 11.6–15.9)
Lymphocytes Relative: 16 %
Lymphs Abs: 1 10*3/uL (ref 0.9–3.3)
MCH: 30.8 pg (ref 25.1–34.0)
MCHC: 33.6 g/dL (ref 31.5–36.0)
MCV: 91.7 fL (ref 79.5–101.0)
Monocytes Absolute: 0.6 10*3/uL (ref 0.1–0.9)
Monocytes Relative: 10 %
Neutro Abs: 4.4 10*3/uL (ref 1.5–6.5)
Neutrophils Relative %: 72 %
Platelet Count: 149 10*3/uL (ref 145–400)
RBC: 4.1 MIL/uL (ref 3.70–5.45)
RDW: 13.8 % (ref 11.2–14.5)
WBC Count: 6.1 10*3/uL (ref 3.9–10.3)

## 2018-02-21 LAB — CMP (CANCER CENTER ONLY)
ALT: 21 U/L (ref 0–44)
AST: 29 U/L (ref 15–41)
Albumin: 3.8 g/dL (ref 3.5–5.0)
Alkaline Phosphatase: 164 U/L — ABNORMAL HIGH (ref 38–126)
Anion gap: 13 (ref 5–15)
BUN: 14 mg/dL (ref 8–23)
CO2: 22 mmol/L (ref 22–32)
Calcium: 9.4 mg/dL (ref 8.9–10.3)
Chloride: 107 mmol/L (ref 98–111)
Creatinine: 0.76 mg/dL (ref 0.44–1.00)
GFR, Est AFR Am: >60 mL/min (ref >60)
GFR, Estimated: >60 mL/min (ref >60)
Glucose, Bld: 95 mg/dL (ref 70–99)
Potassium: 3.4 mmol/L — ABNORMAL LOW (ref 3.5–5.1)
Sodium: 142 mmol/L (ref 135–145)
Total Bilirubin: 0.7 mg/dL (ref 0.3–1.2)
Total Protein: 8 g/dL (ref 6.5–8.1)

## 2018-02-21 LAB — TSH: TSH: 0.586 u[IU]/mL (ref 0.308–3.960)

## 2018-02-21 LAB — TOTAL PROTEIN, URINE DIPSTICK: Protein, ur: NEGATIVE mg/dL

## 2018-02-21 MED ORDER — PALONOSETRON HCL INJECTION 0.25 MG/5ML
INTRAVENOUS | Status: AC
  Filled 2018-02-21: qty 5

## 2018-02-21 MED ORDER — SODIUM CHLORIDE 0.9 % IV SOLN
Freq: Once | INTRAVENOUS | Status: AC
  Administered 2018-02-21: 09:00:00 via INTRAVENOUS
  Filled 2018-02-21: qty 250

## 2018-02-21 MED ORDER — DIPHENHYDRAMINE HCL 50 MG/ML IJ SOLN
50.0000 mg | Freq: Once | INTRAMUSCULAR | Status: AC
  Administered 2018-02-21: 50 mg via INTRAVENOUS

## 2018-02-21 MED ORDER — FAMOTIDINE IN NACL 20-0.9 MG/50ML-% IV SOLN
20.0000 mg | Freq: Once | INTRAVENOUS | Status: AC
  Administered 2018-02-21: 20 mg via INTRAVENOUS

## 2018-02-21 MED ORDER — SODIUM CHLORIDE 0.9 % IV SOLN
15.3000 mg/kg | INTRAVENOUS | Status: DC
  Administered 2018-02-21: 1500 mg via INTRAVENOUS
  Filled 2018-02-21: qty 48

## 2018-02-21 MED ORDER — FAMOTIDINE IN NACL 20-0.9 MG/50ML-% IV SOLN
INTRAVENOUS | Status: AC
  Filled 2018-02-21: qty 50

## 2018-02-21 MED ORDER — SODIUM CHLORIDE 0.9 % IV SOLN
1200.0000 mg | Freq: Once | INTRAVENOUS | Status: AC
  Administered 2018-02-21: 1200 mg via INTRAVENOUS
  Filled 2018-02-21: qty 20

## 2018-02-21 MED ORDER — DIPHENHYDRAMINE HCL 50 MG/ML IJ SOLN
INTRAMUSCULAR | Status: AC
  Filled 2018-02-21: qty 1

## 2018-02-21 MED ORDER — PALONOSETRON HCL INJECTION 0.25 MG/5ML
0.2500 mg | Freq: Once | INTRAVENOUS | Status: AC
  Administered 2018-02-21: 0.25 mg via INTRAVENOUS

## 2018-02-21 MED ORDER — SODIUM CHLORIDE 0.9 % IV SOLN
655.2000 mg | Freq: Once | INTRAVENOUS | Status: AC
  Administered 2018-02-21: 660 mg via INTRAVENOUS
  Filled 2018-02-21: qty 66

## 2018-02-21 MED ORDER — SODIUM CHLORIDE 0.9 % IV SOLN
Freq: Once | INTRAVENOUS | Status: AC
  Administered 2018-02-21: 10:00:00 via INTRAVENOUS
  Filled 2018-02-21: qty 5

## 2018-02-21 MED ORDER — SODIUM CHLORIDE 0.9 % IV SOLN
200.0000 mg/m2 | Freq: Once | INTRAVENOUS | Status: AC
  Administered 2018-02-21: 426 mg via INTRAVENOUS
  Filled 2018-02-21: qty 71

## 2018-02-21 NOTE — Patient Instructions (Addendum)
[facility] Discharge Instructions for Patients Receiving Chemotherapy  Today you received the following chemotherapy agents: Avastin, Tecentriq, Paclitaxel, and Carboplatin  To help prevent nausea and vomiting after your treatment, we encourage you to take your nausea medication as prescribed.   If you develop nausea and vomiting that is not controlled by your nausea medication, call the clinic.   BELOW ARE SYMPTOMS THAT SHOULD BE REPORTED IMMEDIATELY:  *FEVER GREATER THAN 100.5 F  *CHILLS WITH OR WITHOUT FEVER  NAUSEA AND VOMITING THAT IS NOT CONTROLLED WITH YOUR NAUSEA MEDICATION  *UNUSUAL SHORTNESS OF BREATH  *UNUSUAL BRUISING OR BLEEDING  TENDERNESS IN MOUTH AND THROAT WITH OR WITHOUT PRESENCE OF ULCERS  *URINARY PROBLEMS  *BOWEL PROBLEMS  UNUSUAL RASH Items with * indicate a potential emergency and should be followed up as soon as possible.  Feel free to call the clinic should you have any questions or concerns. The clinic phone number is [phone].  Please show the [facility] at check-in to the Emergency Department and triage nurse.    Bevacizumab injection What is this medicine? BEVACIZUMAB (be va SIZ yoo mab) is a monoclonal antibody. It is used to treat many types of cancer. This medicine may be used for other purposes; ask your health care provider or pharmacist if you have questions. COMMON BRAND NAME(S): Avastin What should I tell my health care provider before I take this medicine? They need to know if you have any of these conditions: -diabetes -heart disease -high blood pressure -history of coughing up blood -prior anthracycline chemotherapy (e.g., doxorubicin, daunorubicin, epirubicin) -recent or ongoing radiation therapy -recent or planning to have surgery -stroke -an unusual or allergic reaction to bevacizumab, hamster proteins, mouse proteins, other medicines, foods, dyes, or preservatives -pregnant or trying to get  pregnant -breast-feeding How should I use this medicine? This medicine is for infusion into a vein. It is given by a health care professional in a hospital or clinic setting. Talk to your pediatrician regarding the use of this medicine in children. Special care may be needed. Overdosage: If you think you have taken too much of this medicine contact a poison control center or emergency room at once. NOTE: This medicine is only for you. Do not share this medicine with others. What if I miss a dose? It is important not to miss your dose. Call your doctor or health care professional if you are unable to keep an appointment. What may interact with this medicine? Interactions are not expected. This list may not describe all possible interactions. Give your health care provider a list of all the medicines, herbs, non-prescription drugs, or dietary supplements you use. Also tell them if you smoke, drink alcohol, or use illegal drugs. Some items may interact with your medicine. What should I watch for while using this medicine? Your condition will be monitored carefully while you are receiving this medicine. You will need important blood work and urine testing done while you are taking this medicine. This medicine may increase your risk to bruise or bleed. Call your doctor or health care professional if you notice any unusual bleeding. This medicine should be started at least 28 days following major surgery and the site of the surgery should be totally healed. Check with your doctor before scheduling dental work or surgery while you are receiving this treatment. Talk to your doctor if you have recently had surgery or if you have a wound that has not healed. Do not become pregnant while taking  this medicine or for 6 months after stopping it. Women should inform their doctor if they wish to become pregnant or think they might be pregnant. There is a potential for serious side effects to an unborn child. Talk to  your health care professional or pharmacist for more information. Do not breast-feed an infant while taking this medicine and for 6 months after the last dose. This medicine has caused ovarian failure in some women. This medicine may interfere with the ability to have a child. You should talk to your doctor or health care professional if you are concerned about your fertility. What side effects may I notice from receiving this medicine? Side effects that you should report to your doctor or health care professional as soon as possible: -allergic reactions like skin rash, itching or hives, swelling of the face, lips, or tongue -chest pain or chest tightness -chills -coughing up blood -high fever -seizures -severe constipation -signs and symptoms of bleeding such as bloody or black, tarry stools; red or dark-brown urine; spitting up blood or brown material that looks like coffee grounds; red spots on the skin; unusual bruising or bleeding from the eye, gums, or nose -signs and symptoms of a blood clot such as breathing problems; chest pain; severe, sudden headache; pain, swelling, warmth in the leg -signs and symptoms of a stroke like changes in vision; confusion; trouble speaking or understanding; severe headaches; sudden numbness or weakness of the face, arm or leg; trouble walking; dizziness; loss of balance or coordination -stomach pain -sweating -swelling of legs or ankles -vomiting -weight gain Side effects that usually do not require medical attention (report to your doctor or health care professional if they continue or are bothersome): -back pain -changes in taste -decreased appetite -dry skin -nausea -tiredness This list may not describe all possible side effects. Call your doctor for medical advice about side effects. You may report side effects to FDA at 1-800-FDA-1088. Where should I keep my medicine? This drug is given in a hospital or clinic and will not be stored at  home. NOTE: This sheet is a summary. It may not cover all possible information. If you have questions about this medicine, talk to your doctor, pharmacist, or health care provider.  2018 Elsevier/Gold Standard (2016-05-12 14:33:29)    Atezolizumab injection What is this medicine? ATEZOLIZUMAB (a te zoe LIZ ue mab) is a monoclonal antibody. It is used to treat bladder cancer (urothelial cancer) and non-small cell lung cancer. This medicine may be used for other purposes; ask your health care provider or pharmacist if you have questions. COMMON BRAND NAME(S): Tecentriq What should I tell my health care provider before I take this medicine? They need to know if you have any of these conditions: -diabetes -immune system problems -infection -inflammatory bowel disease -liver disease -lung or breathing disease -lupus -nervous system problems like myasthenia gravis or Guillain-Barre syndrome -organ transplant -an unusual or allergic reaction to atezolizumab, other medicines, foods, dyes, or preservatives -pregnant or trying to get pregnant -breast-feeding How should I use this medicine? This medicine is for infusion into a vein. It is given by a health care professional in a hospital or clinic setting. A special MedGuide will be given to you before each treatment. Be sure to read this information carefully each time. Talk to your pediatrician regarding the use of this medicine in children. Special care may be needed. Overdosage: If you think you have taken too much of this medicine contact a poison control center or emergency  room at once. NOTE: This medicine is only for you. Do not share this medicine with others. What if I miss a dose? It is important not to miss your dose. Call your doctor or health care professional if you are unable to keep an appointment. What may interact with this medicine? Interactions have not been studied. This list may not describe all possible interactions.  Give your health care provider a list of all the medicines, herbs, non-prescription drugs, or dietary supplements you use. Also tell them if you smoke, drink alcohol, or use illegal drugs. Some items may interact with your medicine. What should I watch for while using this medicine? Your condition will be monitored carefully while you are receiving this medicine. You may need blood work done while you are taking this medicine. Do not become pregnant while taking this medicine or for at least 5 months after stopping it. Women should inform their doctor if they wish to become pregnant or think they might be pregnant. There is a potential for serious side effects to an unborn child. Talk to your health care professional or pharmacist for more information. Do not breast-feed an infant while taking this medicine or for at least 5 months after the last dose. What side effects may I notice from receiving this medicine? Side effects that you should report to your doctor or health care professional as soon as possible: -allergic reactions like skin rash, itching or hives, swelling of the face, lips, or tongue -black, tarry stools -bloody or watery diarrhea -breathing problems -changes in vision -chest pain or chest tightness -chills -facial flushing -fever -headache -signs and symptoms of high blood sugar such as dizziness; dry mouth; dry skin; fruity breath; nausea; stomach pain; increased hunger or thirst; increased urination -signs and symptoms of liver injury like dark yellow or brown urine; general ill feeling or flu-like symptoms; light-colored stools; loss of appetite; nausea; right upper belly pain; unusually weak or tired; yellowing of the eyes or skin -stomach pain -trouble passing urine or change in the amount of urine Side effects that usually do not require medical attention (report to your doctor or health care professional if they continue or are bothersome): -cough -diarrhea -joint  pain -muscle pain -muscle weakness -tiredness -weight loss This list may not describe all possible side effects. Call your doctor for medical advice about side effects. You may report side effects to FDA at 1-800-FDA-1088. Where should I keep my medicine? This drug is given in a hospital or clinic and will not be stored at home. NOTE: This sheet is a summary. It may not cover all possible information. If you have questions about this medicine, talk to your doctor, pharmacist, or health care provider.  2018 Elsevier/Gold Standard (2015-06-16 17:54:14)    Paclitaxel injection What is this medicine? PACLITAXEL (PAK li TAX el) is a chemotherapy drug. It targets fast dividing cells, like cancer cells, and causes these cells to die. This medicine is used to treat ovarian cancer, breast cancer, and other cancers. This medicine may be used for other purposes; ask your health care provider or pharmacist if you have questions. COMMON BRAND NAME(S): Onxol, Taxol What should I tell my health care provider before I take this medicine? They need to know if you have any of these conditions: -blood disorders -irregular heartbeat -infection (especially a virus infection such as chickenpox, cold sores, or herpes) -liver disease -previous or ongoing radiation therapy -an unusual or allergic reaction to paclitaxel, alcohol, polyoxyethylated castor oil, other chemotherapy  agents, other medicines, foods, dyes, or preservatives -pregnant or trying to get pregnant -breast-feeding How should I use this medicine? This drug is given as an infusion into a vein. It is administered in a hospital or clinic by a specially trained health care professional. Talk to your pediatrician regarding the use of this medicine in children. Special care may be needed. Overdosage: If you think you have taken too much of this medicine contact a poison control center or emergency room at once. NOTE: This medicine is only for you.  Do not share this medicine with others. What if I miss a dose? It is important not to miss your dose. Call your doctor or health care professional if you are unable to keep an appointment. What may interact with this medicine? Do not take this medicine with any of the following medications: -disulfiram -metronidazole This medicine may also interact with the following medications: -cyclosporine -diazepam -ketoconazole -medicines to increase blood counts like filgrastim, pegfilgrastim, sargramostim -other chemotherapy drugs like cisplatin, doxorubicin, epirubicin, etoposide, teniposide, vincristine -quinidine -testosterone -vaccines -verapamil Talk to your doctor or health care professional before taking any of these medicines: -acetaminophen -aspirin -ibuprofen -ketoprofen -naproxen This list may not describe all possible interactions. Give your health care provider a list of all the medicines, herbs, non-prescription drugs, or dietary supplements you use. Also tell them if you smoke, drink alcohol, or use illegal drugs. Some items may interact with your medicine. What should I watch for while using this medicine? Your condition will be monitored carefully while you are receiving this medicine. You will need important blood work done while you are taking this medicine. This medicine can cause serious allergic reactions. To reduce your risk you will need to take other medicine(s) before treatment with this medicine. If you experience allergic reactions like skin rash, itching or hives, swelling of the face, lips, or tongue, tell your doctor or health care professional right away. In some cases, you may be given additional medicines to help with side effects. Follow all directions for their use. This drug may make you feel generally unwell. This is not uncommon, as chemotherapy can affect healthy cells as well as cancer cells. Report any side effects. Continue your course of treatment even  though you feel ill unless your doctor tells you to stop. Call your doctor or health care professional for advice if you get a fever, chills or sore throat, or other symptoms of a cold or flu. Do not treat yourself. This drug decreases your body's ability to fight infections. Try to avoid being around people who are sick. This medicine may increase your risk to bruise or bleed. Call your doctor or health care professional if you notice any unusual bleeding. Be careful brushing and flossing your teeth or using a toothpick because you may get an infection or bleed more easily. If you have any dental work done, tell your dentist you are receiving this medicine. Avoid taking products that contain aspirin, acetaminophen, ibuprofen, naproxen, or ketoprofen unless instructed by your doctor. These medicines may hide a fever. Do not become pregnant while taking this medicine. Women should inform their doctor if they wish to become pregnant or think they might be pregnant. There is a potential for serious side effects to an unborn child. Talk to your health care professional or pharmacist for more information. Do not breast-feed an infant while taking this medicine. Men are advised not to father a child while receiving this medicine. This product may contain alcohol. Ask  your pharmacist or healthcare provider if this medicine contains alcohol. Be sure to tell all healthcare providers you are taking this medicine. Certain medicines, like metronidazole and disulfiram, can cause an unpleasant reaction when taken with alcohol. The reaction includes flushing, headache, nausea, vomiting, sweating, and increased thirst. The reaction can last from 30 minutes to several hours. What side effects may I notice from receiving this medicine? Side effects that you should report to your doctor or health care professional as soon as possible: -allergic reactions like skin rash, itching or hives, swelling of the face, lips, or  tongue -low blood counts - This drug may decrease the number of white blood cells, red blood cells and platelets. You may be at increased risk for infections and bleeding. -signs of infection - fever or chills, cough, sore throat, pain or difficulty passing urine -signs of decreased platelets or bleeding - bruising, pinpoint red spots on the skin, black, tarry stools, nosebleeds -signs of decreased red blood cells - unusually weak or tired, fainting spells, lightheadedness -breathing problems -chest pain -high or low blood pressure -mouth sores -nausea and vomiting -pain, swelling, redness or irritation at the injection site -pain, tingling, numbness in the hands or feet -slow or irregular heartbeat -swelling of the ankle, feet, hands Side effects that usually do not require medical attention (report to your doctor or health care professional if they continue or are bothersome): -bone pain -complete hair loss including hair on your head, underarms, pubic hair, eyebrows, and eyelashes -changes in the color of fingernails -diarrhea -loosening of the fingernails -loss of appetite -muscle or joint pain -red flush to skin -sweating This list may not describe all possible side effects. Call your doctor for medical advice about side effects. You may report side effects to FDA at 1-800-FDA-1088. Where should I keep my medicine? This drug is given in a hospital or clinic and will not be stored at home. NOTE: This sheet is a summary. It may not cover all possible information. If you have questions about this medicine, talk to your doctor, pharmacist, or health care provider.  2018 Elsevier/Gold Standard (2015-03-16 19:58:00)    cCarboplatin injection What is this medicine? CARBOPLATIN (KAR boe pla tin) is a chemotherapy drug. It targets fast dividing cells, like cancer cells, and causes these cells to die. This medicine is used to treat ovarian cancer and many other cancers. This medicine  may be used for other purposes; ask your health care provider or pharmacist if you have questions. COMMON BRAND NAME(S): Paraplatin What should I tell my health care provider before I take this medicine? They need to know if you have any of these conditions: -blood disorders -hearing problems -kidney disease -recent or ongoing radiation therapy -an unusual or allergic reaction to carboplatin, cisplatin, other chemotherapy, other medicines, foods, dyes, or preservatives -pregnant or trying to get pregnant -breast-feeding How should I use this medicine? This drug is usually given as an infusion into a vein. It is administered in a hospital or clinic by a specially trained health care professional. Talk to your pediatrician regarding the use of this medicine in children. Special care may be needed. Overdosage: If you think you have taken too much of this medicine contact a poison control center or emergency room at once. NOTE: This medicine is only for you. Do not share this medicine with others. What if I miss a dose? It is important not to miss a dose. Call your doctor or health care professional if you are unable  to keep an appointment. What may interact with this medicine? -medicines for seizures -medicines to increase blood counts like filgrastim, pegfilgrastim, sargramostim -some antibiotics like amikacin, gentamicin, neomycin, streptomycin, tobramycin -vaccines Talk to your doctor or health care professional before taking any of these medicines: -acetaminophen -aspirin -ibuprofen -ketoprofen -naproxen This list may not describe all possible interactions. Give your health care provider a list of all the medicines, herbs, non-prescription drugs, or dietary supplements you use. Also tell them if you smoke, drink alcohol, or use illegal drugs. Some items may interact with your medicine. What should I watch for while using this medicine? Your condition will be monitored carefully while  you are receiving this medicine. You will need important blood work done while you are taking this medicine. This drug may make you feel generally unwell. This is not uncommon, as chemotherapy can affect healthy cells as well as cancer cells. Report any side effects. Continue your course of treatment even though you feel ill unless your doctor tells you to stop. In some cases, you may be given additional medicines to help with side effects. Follow all directions for their use. Call your doctor or health care professional for advice if you get a fever, chills or sore throat, or other symptoms of a cold or flu. Do not treat yourself. This drug decreases your body's ability to fight infections. Try to avoid being around people who are sick. This medicine may increase your risk to bruise or bleed. Call your doctor or health care professional if you notice any unusual bleeding. Be careful brushing and flossing your teeth or using a toothpick because you may get an infection or bleed more easily. If you have any dental work done, tell your dentist you are receiving this medicine. Avoid taking products that contain aspirin, acetaminophen, ibuprofen, naproxen, or ketoprofen unless instructed by your doctor. These medicines may hide a fever. Do not become pregnant while taking this medicine. Women should inform their doctor if they wish to become pregnant or think they might be pregnant. There is a potential for serious side effects to an unborn child. Talk to your health care professional or pharmacist for more information. Do not breast-feed an infant while taking this medicine. What side effects may I notice from receiving this medicine? Side effects that you should report to your doctor or health care professional as soon as possible: -allergic reactions like skin rash, itching or hives, swelling of the face, lips, or tongue -signs of infection - fever or chills, cough, sore throat, pain or difficulty passing  urine -signs of decreased platelets or bleeding - bruising, pinpoint red spots on the skin, black, tarry stools, nosebleeds -signs of decreased red blood cells - unusually weak or tired, fainting spells, lightheadedness -breathing problems -changes in hearing -changes in vision -chest pain -high blood pressure -low blood counts - This drug may decrease the number of white blood cells, red blood cells and platelets. You may be at increased risk for infections and bleeding. -nausea and vomiting -pain, swelling, redness or irritation at the injection site -pain, tingling, numbness in the hands or feet -problems with balance, talking, walking -trouble passing urine or change in the amount of urine Side effects that usually do not require medical attention (report to your doctor or health care professional if they continue or are bothersome): -hair loss -loss of appetite -metallic taste in the mouth or changes in taste This list may not describe all possible side effects. Call your doctor for medical advice  about side effects. You may report side effects to FDA at 1-800-FDA-1088. Where should I keep my medicine? This drug is given in a hospital or clinic and will not be stored at home. NOTE: This sheet is a summary. It may not cover all possible information. If you have questions about this medicine, talk to your doctor, pharmacist, or health care provider.  2018 [employer]vier/Gold Standard (2007-08-20 14:38:05)

## 2018-02-23 ENCOUNTER — Inpatient Hospital Stay: Payer: Medicare Other

## 2018-02-23 VITALS — BP 134/46 | HR 80 | Temp 98.2°F | Resp 16

## 2018-02-23 DIAGNOSIS — C778 Secondary and unspecified malignant neoplasm of lymph nodes of multiple regions: Secondary | ICD-10-CM | POA: Diagnosis not present

## 2018-02-23 DIAGNOSIS — Z5112 Encounter for antineoplastic immunotherapy: Secondary | ICD-10-CM | POA: Diagnosis not present

## 2018-02-23 DIAGNOSIS — Z5189 Encounter for other specified aftercare: Secondary | ICD-10-CM | POA: Diagnosis not present

## 2018-02-23 DIAGNOSIS — Z5111 Encounter for antineoplastic chemotherapy: Secondary | ICD-10-CM | POA: Diagnosis not present

## 2018-02-23 DIAGNOSIS — C3412 Malignant neoplasm of upper lobe, left bronchus or lung: Secondary | ICD-10-CM | POA: Diagnosis not present

## 2018-02-23 DIAGNOSIS — C3492 Malignant neoplasm of unspecified part of left bronchus or lung: Secondary | ICD-10-CM

## 2018-02-23 DIAGNOSIS — C787 Secondary malignant neoplasm of liver and intrahepatic bile duct: Secondary | ICD-10-CM | POA: Diagnosis not present

## 2018-02-23 MED ORDER — PEGFILGRASTIM INJECTION 6 MG/0.6ML ~~LOC~~
PREFILLED_SYRINGE | SUBCUTANEOUS | Status: AC
  Filled 2018-02-23: qty 0.6

## 2018-02-23 MED ORDER — PEGFILGRASTIM INJECTION 6 MG/0.6ML ~~LOC~~
6.0000 mg | PREFILLED_SYRINGE | Freq: Once | SUBCUTANEOUS | Status: AC
  Administered 2018-02-23: 6 mg via SUBCUTANEOUS

## 2018-02-23 NOTE — Patient Instructions (Signed)
Pegfilgrastim injection What is this medicine? PEGFILGRASTIM (PEG fil gra stim) is a long-acting granulocyte colony-stimulating factor that stimulates the growth of neutrophils, a type of white blood cell important in the body's fight against infection. It is used to reduce the incidence of fever and infection in patients with certain types of cancer who are receiving chemotherapy that affects the bone marrow, and to increase survival after being exposed to high doses of radiation. This medicine may be used for other purposes; ask your health care provider or pharmacist if you have questions. COMMON BRAND NAME(S): Neulasta What should I tell my health care provider before I take this medicine? They need to know if you have any of these conditions: -kidney disease -latex allergy -ongoing radiation therapy -sickle cell disease -skin reactions to acrylic adhesives (On-Body Injector only) -an unusual or allergic reaction to pegfilgrastim, filgrastim, other medicines, foods, dyes, or preservatives -pregnant or trying to get pregnant -breast-feeding How should I use this medicine? This medicine is for injection under the skin. If you get this medicine at home, you will be taught how to prepare and give the pre-filled syringe or how to use the On-body Injector. Refer to the patient Instructions for Use for detailed instructions. Use exactly as directed. Tell your healthcare provider immediately if you suspect that the On-body Injector may not have performed as intended or if you suspect the use of the On-body Injector resulted in a missed or partial dose. It is important that you put your used needles and syringes in a special sharps container. Do not put them in a trash can. If you do not have a sharps container, call your pharmacist or healthcare provider to get one. Talk to your pediatrician regarding the use of this medicine in children. While this drug may be prescribed for selected conditions,  precautions do apply. Overdosage: If you think you have taken too much of this medicine contact a poison control center or emergency room at once. NOTE: This medicine is only for you. Do not share this medicine with others. What if I miss a dose? It is important not to miss your dose. Call your doctor or health care professional if you miss your dose. If you miss a dose due to an On-body Injector failure or leakage, a new dose should be administered as soon as possible using a single prefilled syringe for manual use. What may interact with this medicine? Interactions have not been studied. Give your health care provider a list of all the medicines, herbs, non-prescription drugs, or dietary supplements you use. Also tell them if you smoke, drink alcohol, or use illegal drugs. Some items may interact with your medicine. This list may not describe all possible interactions. Give your health care provider a list of all the medicines, herbs, non-prescription drugs, or dietary supplements you use. Also tell them if you smoke, drink alcohol, or use illegal drugs. Some items may interact with your medicine. What should I watch for while using this medicine? You may need blood work done while you are taking this medicine. If you are going to need a MRI, CT scan, or other procedure, tell your doctor that you are using this medicine (On-Body Injector only). What side effects may I notice from receiving this medicine? Side effects that you should report to your doctor or health care professional as soon as possible: -allergic reactions like skin rash, itching or hives, swelling of the face, lips, or tongue -dizziness -fever -pain, redness, or irritation at site   where injected -pinpoint red spots on the skin -red or dark-brown urine -shortness of breath or breathing problems -stomach or side pain, or pain at the shoulder -swelling -tiredness -trouble passing urine or change in the amount of urine Side  effects that usually do not require medical attention (report to your doctor or health care professional if they continue or are bothersome): -bone pain -muscle pain This list may not describe all possible side effects. Call your doctor for medical advice about side effects. You may report side effects to FDA at 1-800-FDA-1088. Where should I keep my medicine? Keep out of the reach of children. Store pre-filled syringes in a refrigerator between 2 and 8 degrees C (36 and 46 degrees F). Do not freeze. Keep in carton to protect from light. Throw away this medicine if it is left out of the refrigerator for more than 48 hours. Throw away any unused medicine after the expiration date. NOTE: This sheet is a summary. It may not cover all possible information. If you have questions about this medicine, talk to your doctor, pharmacist, or health care provider.  2018 Elsevier/Gold Standard (2016-05-11 12:58:03)  

## 2018-02-28 ENCOUNTER — Telehealth: Payer: Self-pay | Admitting: *Deleted

## 2018-02-28 ENCOUNTER — Inpatient Hospital Stay: Payer: Medicare Other | Attending: Internal Medicine

## 2018-02-28 DIAGNOSIS — R531 Weakness: Secondary | ICD-10-CM | POA: Diagnosis not present

## 2018-02-28 DIAGNOSIS — Z86718 Personal history of other venous thrombosis and embolism: Secondary | ICD-10-CM | POA: Insufficient documentation

## 2018-02-28 DIAGNOSIS — Z5112 Encounter for antineoplastic immunotherapy: Secondary | ICD-10-CM | POA: Diagnosis not present

## 2018-02-28 DIAGNOSIS — Z86711 Personal history of pulmonary embolism: Secondary | ICD-10-CM | POA: Diagnosis not present

## 2018-02-28 DIAGNOSIS — R030 Elevated blood-pressure reading, without diagnosis of hypertension: Secondary | ICD-10-CM | POA: Diagnosis not present

## 2018-02-28 DIAGNOSIS — C787 Secondary malignant neoplasm of liver and intrahepatic bile duct: Secondary | ICD-10-CM | POA: Insufficient documentation

## 2018-02-28 DIAGNOSIS — C3412 Malignant neoplasm of upper lobe, left bronchus or lung: Secondary | ICD-10-CM | POA: Diagnosis not present

## 2018-02-28 DIAGNOSIS — R5383 Other fatigue: Secondary | ICD-10-CM | POA: Insufficient documentation

## 2018-02-28 DIAGNOSIS — Z5111 Encounter for antineoplastic chemotherapy: Secondary | ICD-10-CM | POA: Insufficient documentation

## 2018-02-28 DIAGNOSIS — Z79899 Other long term (current) drug therapy: Secondary | ICD-10-CM | POA: Diagnosis not present

## 2018-02-28 DIAGNOSIS — C778 Secondary and unspecified malignant neoplasm of lymph nodes of multiple regions: Secondary | ICD-10-CM | POA: Insufficient documentation

## 2018-02-28 DIAGNOSIS — Z7901 Long term (current) use of anticoagulants: Secondary | ICD-10-CM | POA: Insufficient documentation

## 2018-02-28 DIAGNOSIS — C3492 Malignant neoplasm of unspecified part of left bronchus or lung: Secondary | ICD-10-CM

## 2018-02-28 DIAGNOSIS — Z7689 Persons encountering health services in other specified circumstances: Secondary | ICD-10-CM | POA: Diagnosis not present

## 2018-02-28 DIAGNOSIS — M545 Low back pain: Secondary | ICD-10-CM | POA: Insufficient documentation

## 2018-02-28 LAB — CBC WITH DIFFERENTIAL (CANCER CENTER ONLY)
BASOS ABS: 0 10*3/uL (ref 0.0–0.1)
BASOS PCT: 0 %
EOS ABS: 0 10*3/uL (ref 0.0–0.5)
EOS PCT: 4 %
HEMATOCRIT: 33.5 % — AB (ref 34.8–46.6)
Hemoglobin: 11.3 g/dL — ABNORMAL LOW (ref 11.6–15.9)
Lymphocytes Relative: 40 %
Lymphs Abs: 0.5 10*3/uL — ABNORMAL LOW (ref 0.9–3.3)
MCH: 30.7 pg (ref 25.1–34.0)
MCHC: 33.8 g/dL (ref 31.5–36.0)
MCV: 90.9 fL (ref 79.5–101.0)
MONO ABS: 0.6 10*3/uL (ref 0.1–0.9)
MONOS PCT: 44 %
NEUTROS ABS: 0.2 10*3/uL — AB (ref 1.5–6.5)
Neutrophils Relative %: 12 %
PLATELETS: 36 10*3/uL — AB (ref 145–400)
RBC: 3.69 MIL/uL — ABNORMAL LOW (ref 3.70–5.45)
RDW: 13.8 % (ref 11.2–14.5)
WBC Count: 1.4 10*3/uL — ABNORMAL LOW (ref 3.9–10.3)

## 2018-02-28 LAB — CMP (CANCER CENTER ONLY)
ALBUMIN: 3.4 g/dL — AB (ref 3.5–5.0)
ALT: 26 U/L (ref 0–44)
ANION GAP: 10 (ref 5–15)
AST: 31 U/L (ref 15–41)
Alkaline Phosphatase: 143 U/L — ABNORMAL HIGH (ref 38–126)
BUN: 12 mg/dL (ref 8–23)
CO2: 26 mmol/L (ref 22–32)
Calcium: 8.9 mg/dL (ref 8.9–10.3)
Chloride: 102 mmol/L (ref 98–111)
Creatinine: 0.72 mg/dL (ref 0.44–1.00)
GFR, Est AFR Am: >60 mL/min (ref >60)
GFR, Estimated: >60 mL/min (ref >60)
GLUCOSE: 104 mg/dL — AB (ref 70–99)
POTASSIUM: 4.1 mmol/L (ref 3.5–5.1)
SODIUM: 138 mmol/L (ref 135–145)
Total Bilirubin: 0.5 mg/dL (ref 0.3–1.2)
Total Protein: 6.9 g/dL (ref 6.5–8.1)

## 2018-02-28 NOTE — Telephone Encounter (Signed)
Pt called, discussed neutropenic precautions, good handwashing. Instructed pt to take tempeture 2-3 times day, encouraged fluids and small snacks with plenty of protein. Pt states she is " hurting and is sad she cant even hold the baby, who is 32 months old.). Pt advised she is taking Claritin and tylenol to help with symptoms.  Pt will call with any additional concerns.

## 2018-02-28 NOTE — Telephone Encounter (Signed)
Labs reviewed with MD, called pt to go to ED if fever chills, or night sweats. Good hand washing, do not eat out if possible. Request call back to go over this this pt.

## 2018-03-01 DIAGNOSIS — R531 Weakness: Secondary | ICD-10-CM | POA: Diagnosis not present

## 2018-03-01 DIAGNOSIS — Z86718 Personal history of other venous thrombosis and embolism: Secondary | ICD-10-CM | POA: Diagnosis not present

## 2018-03-01 DIAGNOSIS — Z79899 Other long term (current) drug therapy: Secondary | ICD-10-CM | POA: Diagnosis not present

## 2018-03-01 DIAGNOSIS — Z7689 Persons encountering health services in other specified circumstances: Secondary | ICD-10-CM | POA: Diagnosis not present

## 2018-03-01 DIAGNOSIS — Z5112 Encounter for antineoplastic immunotherapy: Secondary | ICD-10-CM | POA: Diagnosis not present

## 2018-03-01 DIAGNOSIS — Z5111 Encounter for antineoplastic chemotherapy: Secondary | ICD-10-CM | POA: Diagnosis present

## 2018-03-01 DIAGNOSIS — C3412 Malignant neoplasm of upper lobe, left bronchus or lung: Secondary | ICD-10-CM | POA: Diagnosis not present

## 2018-03-01 DIAGNOSIS — C778 Secondary and unspecified malignant neoplasm of lymph nodes of multiple regions: Secondary | ICD-10-CM | POA: Diagnosis not present

## 2018-03-01 DIAGNOSIS — Z7901 Long term (current) use of anticoagulants: Secondary | ICD-10-CM | POA: Diagnosis not present

## 2018-03-01 DIAGNOSIS — C787 Secondary malignant neoplasm of liver and intrahepatic bile duct: Secondary | ICD-10-CM | POA: Diagnosis not present

## 2018-03-01 DIAGNOSIS — M545 Low back pain: Secondary | ICD-10-CM | POA: Diagnosis not present

## 2018-03-01 DIAGNOSIS — Z86711 Personal history of pulmonary embolism: Secondary | ICD-10-CM | POA: Diagnosis not present

## 2018-03-01 DIAGNOSIS — R5383 Other fatigue: Secondary | ICD-10-CM | POA: Diagnosis not present

## 2018-03-01 DIAGNOSIS — R030 Elevated blood-pressure reading, without diagnosis of hypertension: Secondary | ICD-10-CM | POA: Diagnosis not present

## 2018-03-06 ENCOUNTER — Emergency Department (HOSPITAL_COMMUNITY): Payer: Medicare Other

## 2018-03-06 ENCOUNTER — Inpatient Hospital Stay (HOSPITAL_COMMUNITY)
Admission: EM | Admit: 2018-03-06 | Discharge: 2018-03-09 | DRG: 175 | Disposition: A | Discharge status: Home / Self Care | Payer: Medicare Other | Attending: Internal Medicine | Admitting: Internal Medicine

## 2018-03-06 ENCOUNTER — Other Ambulatory Visit: Payer: Self-pay

## 2018-03-06 ENCOUNTER — Encounter (HOSPITAL_COMMUNITY): Payer: Self-pay | Admitting: Emergency Medicine

## 2018-03-06 DIAGNOSIS — I82431 Acute embolism and thrombosis of right popliteal vein: Secondary | ICD-10-CM | POA: Diagnosis not present

## 2018-03-06 DIAGNOSIS — R000 Tachycardia, unspecified: Secondary | ICD-10-CM | POA: Diagnosis present

## 2018-03-06 DIAGNOSIS — Z79899 Other long term (current) drug therapy: Secondary | ICD-10-CM

## 2018-03-06 DIAGNOSIS — I2699 Other pulmonary embolism without acute cor pulmonale: Secondary | ICD-10-CM | POA: Diagnosis not present

## 2018-03-06 DIAGNOSIS — I82441 Acute embolism and thrombosis of right tibial vein: Secondary | ICD-10-CM | POA: Diagnosis present

## 2018-03-06 DIAGNOSIS — C787 Secondary malignant neoplasm of liver and intrahepatic bile duct: Secondary | ICD-10-CM | POA: Diagnosis not present

## 2018-03-06 DIAGNOSIS — Z880 Allergy status to penicillin: Secondary | ICD-10-CM

## 2018-03-06 DIAGNOSIS — C3412 Malignant neoplasm of upper lobe, left bronchus or lung: Secondary | ICD-10-CM | POA: Diagnosis present

## 2018-03-06 DIAGNOSIS — Z800 Family history of malignant neoplasm of digestive organs: Secondary | ICD-10-CM

## 2018-03-06 DIAGNOSIS — I444 Left anterior fascicular block: Secondary | ICD-10-CM | POA: Diagnosis present

## 2018-03-06 DIAGNOSIS — I82451 Acute embolism and thrombosis of right peroneal vein: Secondary | ICD-10-CM | POA: Diagnosis not present

## 2018-03-06 DIAGNOSIS — I493 Ventricular premature depolarization: Secondary | ICD-10-CM | POA: Diagnosis present

## 2018-03-06 DIAGNOSIS — C3492 Malignant neoplasm of unspecified part of left bronchus or lung: Secondary | ICD-10-CM | POA: Diagnosis not present

## 2018-03-06 DIAGNOSIS — Z91010 Allergy to peanuts: Secondary | ICD-10-CM

## 2018-03-06 DIAGNOSIS — I2609 Other pulmonary embolism with acute cor pulmonale: Principal | ICD-10-CM | POA: Diagnosis present

## 2018-03-06 DIAGNOSIS — Z8249 Family history of ischemic heart disease and other diseases of the circulatory system: Secondary | ICD-10-CM

## 2018-03-06 DIAGNOSIS — Z90710 Acquired absence of both cervix and uterus: Secondary | ICD-10-CM

## 2018-03-06 DIAGNOSIS — Z808 Family history of malignant neoplasm of other organs or systems: Secondary | ICD-10-CM

## 2018-03-06 DIAGNOSIS — C3490 Malignant neoplasm of unspecified part of unspecified bronchus or lung: Secondary | ICD-10-CM | POA: Diagnosis present

## 2018-03-06 DIAGNOSIS — I071 Rheumatic tricuspid insufficiency: Secondary | ICD-10-CM | POA: Diagnosis present

## 2018-03-06 DIAGNOSIS — Z79890 Hormone replacement therapy (postmenopausal): Secondary | ICD-10-CM

## 2018-03-06 DIAGNOSIS — E039 Hypothyroidism, unspecified: Secondary | ICD-10-CM | POA: Diagnosis present

## 2018-03-06 DIAGNOSIS — R0602 Shortness of breath: Secondary | ICD-10-CM | POA: Diagnosis not present

## 2018-03-06 DIAGNOSIS — Z803 Family history of malignant neoplasm of breast: Secondary | ICD-10-CM

## 2018-03-06 LAB — BASIC METABOLIC PANEL
ANION GAP: 10 (ref 5–15)
BUN: 19 mg/dL (ref 8–23)
CHLORIDE: 107 mmol/L (ref 98–111)
CO2: 24 mmol/L (ref 22–32)
CREATININE: 0.76 mg/dL (ref 0.44–1.00)
Calcium: 9.4 mg/dL (ref 8.9–10.3)
GFR calc non Af Amer: >60 mL/min (ref >60)
GLUCOSE: 98 mg/dL (ref 70–99)
Potassium: 4 mmol/L (ref 3.5–5.1)
Sodium: 141 mmol/L (ref 135–145)

## 2018-03-06 LAB — POCT I-STAT TROPONIN I: TROPONIN I, POC: 0.01 ng/mL (ref 0.00–0.08)

## 2018-03-06 LAB — BRAIN NATRIURETIC PEPTIDE: B Natriuretic Peptide: 51 pg/mL (ref 0.0–100.0)

## 2018-03-06 MED ORDER — IOPAMIDOL (ISOVUE-370) INJECTION 76%
100.0000 mL | Freq: Once | INTRAVENOUS | Status: AC | PRN
  Administered 2018-03-06: 100 mL via INTRAVENOUS

## 2018-03-06 MED ORDER — IOPAMIDOL (ISOVUE-370) INJECTION 76%
INTRAVENOUS | Status: AC
  Filled 2018-03-06: qty 100

## 2018-03-06 MED ORDER — SODIUM CHLORIDE 0.9 % IJ SOLN
INTRAMUSCULAR | Status: AC
  Administered 2018-03-06: 10 mL
  Filled 2018-03-06: qty 50

## 2018-03-06 MED ORDER — METHYLPREDNISOLONE SODIUM SUCC 125 MG IJ SOLR
125.0000 mg | Freq: Once | INTRAMUSCULAR | Status: AC
  Administered 2018-03-06: 125 mg via INTRAVENOUS
  Filled 2018-03-06: qty 2

## 2018-03-06 MED ORDER — ALBUTEROL SULFATE (2.5 MG/3ML) 0.083% IN NEBU
5.0000 mg | INHALATION_SOLUTION | Freq: Once | RESPIRATORY_TRACT | Status: AC
  Administered 2018-03-06: 5 mg via RESPIRATORY_TRACT
  Filled 2018-03-06: qty 6

## 2018-03-06 NOTE — ED Provider Notes (Signed)
[facility] DEPT Provider Note   CSN: [id number] Arrival date & time: 03/06/18  2017     History   Chief Complaint Chief Complaint  Patient presents with  . Shortness of Breath    HPI Debra Hodges is a 67 y.o. female.  Pt presents to the ED today with SOB.  The pt said this has been going on for 2 days.  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 Julien Nordmann.  The pt is currently undergoing treatment with Tagrisso 80 mg daily and started carboplatin for AUC of 6, paclitaxel 200 mg/M2, Avastin 15 mg/KG and Tecentriq 1200 mg IV every 3 weeks with Neulasta support.  First dose 02/21/2018.  She has been sob for the past 2 days.  No f/c.     Past Medical History:  Diagnosis Date  . High blood pressure   . Liver lesion 06/26/2016  . Non-small cell carcinoma of lung, stage 4 ([place]) dx'd 10/2013    Patient Active Problem List   Diagnosis Date Noted  . Acute pulmonary embolism ([place]) 03/07/2018  . Goals of care, counseling/discussion 02/11/2018  . Encounter for antineoplastic immunotherapy 02/11/2018  . Metastases to the liver ([place]) 01/24/2017  . Liver lesion 06/26/2016  . Non-small cell carcinoma of lung, stage 4 ([place])   . Vaginal lesion 01/06/2015  . Encounter for antineoplastic chemotherapy 01/06/2015  . Malignant neoplasm of upper lobe of left lung ([place]) 11/13/2013  . Lung mass 10/16/2013    Past Surgical History:  Procedure Laterality Date  . ABDOMINAL HYSTERECTOMY    . BREAST BIOPSY     Right breast  . VIDEO BRONCHOSCOPY Bilateral 10/27/2013   Procedure: VIDEO BRONCHOSCOPY WITH FLUORO;  Surgeon: Collene Gobble, MD;  Location: [facility] ENDOSCOPY;  Service: Cardiopulmonary;  Laterality: Bilateral;     OB History   None      Home Medications    Prior to Admission medications   Medication Sig Start Date End Date Taking? Authorizing Provider  acetaminophen (TYLENOL) 325 MG tablet Take 650 mg by  mouth every 6 (six) hours as needed. Reported on 06/15/2015   Yes [provider]  calcium carbonate (OS-CAL) 600 MG TABS tablet Take 600 mg by mouth daily.    Yes [provider]  CLIMARA 0.1 MG/24HR patch 1 patch once a week. 08/12/13  Yes [provider]  levothyroxine (SYNTHROID, LEVOTHROID) 50 MCG tablet Take 50 mcg by mouth daily. 11/13/16  Yes [provider]  loperamide (IMODIUM) 2 MG capsule Take by mouth as needed for diarrhea or loose stools. Reported on 11/22/2015   Yes [provider]  loratadine (CLARITIN) 10 MG tablet Take 10 mg by mouth daily.   Yes [provider]  Multiple Vitamins-Calcium (ONE-A-DAY WOMENS PO) Take 1 tablet by mouth daily.   Yes [provider]  oxyCODONE-acetaminophen (PERCOCET/ROXICET) 5-325 MG tablet Take 1 tablet by mouth every 6 (six) hours as needed for severe pain. 02/11/18  Yes Curt Bears, MD  potassium chloride SA (K-DUR,KLOR-CON) 20 MEQ tablet 1 tablet daily.  07/22/13  Yes [provider]  prochlorperazine (COMPAZINE) 10 MG tablet TAKE 1 TABLET(10 MG) BY MOUTH EVERY 6 HOURS AS NEEDED FOR NAUSEA OR VOMITING Patient taking differently: Take 10 mg by mouth every 6 (six) hours as needed for nausea or vomiting.  02/11/18  Yes Curt Bears, MD  Sodium Fluoride (CLINPRO 5000) 1.1 % PSTE Place 1 application onto teeth 3 (three) times  daily.   Yes [provider]  spironolactone (ALDACTONE) 25 MG tablet 1 tablet daily. 10/02/13  Yes [provider]  vitamin C (ASCORBIC ACID) 500 MG tablet Take 500 mg by mouth daily.   Yes [provider]  osimertinib mesylate (TAGRISSO) 80 MG tablet Take 1 tablet (80 mg total) by mouth daily. Patient not taking: Reported on 03/06/2018 01/30/18   Curt Bears, MD    Family History Family History  Problem Relation Age of Onset  . High blood pressure Mother   . Cancer Mother        skin  . Cancer Father        esophageal  .  Cancer Maternal Grandfather        breast    Social History Social History   Tobacco Use  . Smoking status: Never Smoker  . Smokeless tobacco: Never Used  Substance Use Topics  . Alcohol use: No    Comment: social  . Drug use: No     Allergies   Peanut-containing drug products and Penicillins   Review of Systems Review of Systems  Respiratory: Positive for shortness of breath.   All other systems reviewed and are negative.    Physical Exam Updated Vital Signs BP (!) 134/57   Pulse (!) 109   Temp 97.8 F (36.6 C) (Oral)   Resp 19   Ht 5\' 6"  (1.676 m)   Wt 97.5 kg   SpO2 100%   BMI 34.70 kg/m   Physical Exam  Constitutional: She is oriented to person, place, and time. She appears well-developed and well-nourished.  HENT:  Head: Normocephalic and atraumatic.  Mouth/Throat: Oropharynx is clear and moist.  Eyes: Pupils are equal, round, and reactive to light. EOM are normal.  Neck: Normal range of motion. Neck supple.  Cardiovascular: Regular rhythm. Tachycardia present.  Pulmonary/Chest: Breath sounds normal. Tachypnea noted.  Abdominal: Soft. Bowel sounds are normal.  Musculoskeletal: Normal range of motion.       Right lower leg: Normal.       Left lower leg: Normal.  Neurological: She is alert and oriented to person, place, and time.  Skin: Skin is warm and dry. Capillary refill takes less than 2 seconds.  Psychiatric: She has a normal mood and affect. Her behavior is normal.  Nursing note and vitals reviewed.    ED Treatments / Results  Labs (all labs ordered are listed, but only abnormal results are displayed) Labs Reviewed  BASIC METABOLIC PANEL  BRAIN NATRIURETIC PEPTIDE  CBC WITH DIFFERENTIAL/PLATELET  PROTIME-INR  APTT  HEPARIN LEVEL (UNFRACTIONATED)  I-STAT TROPONIN, ED  POCT I-STAT TROPONIN I    EKG None   HR 107.  Sinus tachycardia.  PVCs.  No ST elevation.  Radiology Dg Chest 2 View  Result Date: 03/06/2018 CLINICAL DATA:   Shortness of breath EXAM: CHEST - 2 VIEW COMPARISON:  Chest CT February 08, 2018 FINDINGS: The heart size and mediastinal contours are within normal limits. Left perihilar mass/consolidation is identified. The right lung is clear. There is no pleural effusion. Focal sclerosis of proximal left humerus is unchanged. IMPRESSION: Left perihilar mass/consolidation is identified. The right lung is clear. Electronically Signed   By: Abelardo Diesel M.D.   On: 03/06/2018 21:15   Ct Angio Chest Pe W And/or Wo Contrast  Result Date: 03/06/2018 CLINICAL DATA:  Dyspnea and chest pain starting 2 days ago. EXAM: CT ANGIOGRAPHY CHEST WITH CONTRAST TECHNIQUE: Multidetector CT imaging of the chest was performed using the standard  protocol during bolus administration of intravenous contrast. Multiplanar CT image reconstructions and MIPs were obtained to evaluate the vascular anatomy. CONTRAST:  151mL ISOVUE-370 IOPAMIDOL (ISOVUE-370) INJECTION 76% COMPARISON:  Multiple prior CTs dating back through 08/07/2017 FINDINGS: Cardiovascular: Positive pulmonary embolus within the distal right main pulmonary artery extending into the right lower lobe lobar and segmental branches. RV/LV ratio 1.06 consistent with right heart strain. Heart size is normal. No pericardial effusion or thickening. Nonaneurysmal thoracic aorta. Mediastinum/Nodes: No change and subcentimeter short axis left para soft GIA lymph node now estimated 8 mm versus 9 mm short axis. No mediastinal adenopathy. The esophagus is unremarkable. Trachea is patent. Mainstem bronchi are patent. Slight luminal narrowing from extrinsic slight mass effect of the left upper lobe and lingular bronchi. Lungs/Pleura: Smaller left suprahilar upper lobe masslike opacity, series 4/33 measuring 18 x 17 mm versus 26 x 20 mm previously, series 10/49. Perihilar masslike opacity in the left hilar region predominantly within the left upper lobe in appearance with new bandlike subpleural area of  atelectasis and/or scarring in the left upper lobe, series 10/57. Masslike opacity adjacent to the major fissure within the lingula is redemonstrated also stable in appearance measuring approximately 25 x 20 mm and 26 x 19 mm previously upon my re-measurements. New nonspecific spiculated opacities occupying the superior segment of left lower lobe are new since prior. Although findings may be postinfectious or inflammatory in etiology, the possibility of neoplastic disease accounting for the spiculated left lower lobe opacities is not excluded. Right lung remains relatively clear and stable. Upper Abdomen: Subtle hypodense mass in the right hepatic lobe measuring 26 x 25 mm is slightly larger versus 25 x 22 mm previously. Additional lesion in the left hepatic lobe measuring approximately 28 x 26 mm is stable. A third lesion further caudad in the left hepatic lobe measuring 15 mm diameter is stable. A fourth lesion in the right hepatic lobe adjacent to the gallbladder fossa is stable to slightly smaller at 20 x 17 mm versus 23 x 22 mm previously. Musculoskeletal: Thoracic spine degenerative changes without aggressive osseous lesions. Unchanged mixed sclerotic and cystic lesion of the proximal left humerus possibly cartilaginous. Metastatic disease not entirely excluded. Review of the MIP images confirms the above findings. IMPRESSION: 1. Acute pulmonary emboli within the right main pulmonary artery extending into the right lower lobe with right heart strain, RV/LV ratio 1.06. 2. Redemonstration of ill-defined masslike opacities in the left upper lobe and lingula with new patchy nonspecific slightly spiculated pulmonary opacities occupying the superior segment of left lower lobe. Progression of metastatic disease is of concern now involving superior segment left lower lobe. Otherwise, the suprahilar and lingular opacities are remained relatively stable. 3. Redemonstration of several hypodense hepatic masses most of  which have remained stable to slightly smaller in appearance. These results were called by telephone at the time of interpretation on 03/06/2018 at 11:46 pm to Dr. Isla Pence , who verbally acknowledged these results. Aortic Atherosclerosis (ICD10-I70.0). Electronically Signed   By: Ashley Royalty M.D.   On: 03/06/2018 23:47    Procedures Procedures (including critical care time)  Medications Ordered in ED Medications  iopamidol (ISOVUE-370) 76 % injection (has no administration in time range)  albuterol (PROVENTIL) (2.5 MG/3ML) 0.083% nebulizer solution 5 mg (5 mg Nebulization Given 03/06/18 2323)  methylPREDNISolone sodium succinate (SOLU-MEDROL) 125 mg/2 mL injection 125 mg (125 mg Intravenous Given 03/06/18 2323)  iopamidol (ISOVUE-370) 76 % injection 100 mL (100 mLs Intravenous Contrast Given 03/06/18 2307)  sodium chloride 0.9 % injection (10 mLs  Given 03/06/18 2324)     Initial Impression / Assessment and Plan / ED Course  I have reviewed the triage vital signs and the nursing notes.  Pertinent labs & imaging results that were available during my care of the patient were reviewed by me and considered in my medical decision making (see chart for details).    Just moving from the wheelchair to the bed made pt extremely sob.  Her HR increased and RR increased.  She was placed on 2L [place].  Pt does have a PE, so heparin was ordered.  There is evidence of heart strain, but initial troponin negative.    Pt d/w Dr. Myna Hidalgo (triad) who will admit.  CRITICAL CARE Performed by: Isla Pence   Total critical care time: 30 minutes  Critical care time was exclusive of separately billable procedures and treating other patients.  Critical care was necessary to treat or prevent imminent or life-threatening deterioration.  Critical care was time spent personally by me on the following activities: development of treatment plan with patient and/or surrogate as well as nursing, discussions with  consultants, evaluation of patient's response to treatment, examination of patient, obtaining history from patient or surrogate, ordering and performing treatments and interventions, ordering and review of laboratory studies, ordering and review of radiographic studies, pulse oximetry and re-evaluation of patient's condition.  Final Clinical Impressions(s) / ED Diagnoses   Final diagnoses:  Other acute pulmonary embolism with acute cor pulmonale (HCC)  Non-small cell lung cancer with metastasis [facility])    ED Discharge Orders    None       Isla Pence, MD 03/07/18 0008

## 2018-03-06 NOTE — ED Triage Notes (Signed)
Patient complaining of shortness of breathe. Patient states this started two days ago when she stands she gets sob. Patient [place] not having any other symptoms.

## 2018-03-07 ENCOUNTER — Encounter (HOSPITAL_COMMUNITY): Payer: Self-pay | Admitting: Family Medicine

## 2018-03-07 ENCOUNTER — Observation Stay (HOSPITAL_COMMUNITY): Payer: Medicare Other

## 2018-03-07 ENCOUNTER — Observation Stay (HOSPITAL_BASED_OUTPATIENT_CLINIC_OR_DEPARTMENT_OTHER): Payer: Medicare Other

## 2018-03-07 ENCOUNTER — Inpatient Hospital Stay: Payer: Medicare Other

## 2018-03-07 DIAGNOSIS — Z808 Family history of malignant neoplasm of other organs or systems: Secondary | ICD-10-CM | POA: Diagnosis not present

## 2018-03-07 DIAGNOSIS — I493 Ventricular premature depolarization: Secondary | ICD-10-CM | POA: Diagnosis present

## 2018-03-07 DIAGNOSIS — C3490 Malignant neoplasm of unspecified part of unspecified bronchus or lung: Secondary | ICD-10-CM | POA: Diagnosis not present

## 2018-03-07 DIAGNOSIS — R000 Tachycardia, unspecified: Secondary | ICD-10-CM | POA: Diagnosis present

## 2018-03-07 DIAGNOSIS — I071 Rheumatic tricuspid insufficiency: Secondary | ICD-10-CM | POA: Diagnosis present

## 2018-03-07 DIAGNOSIS — Z880 Allergy status to penicillin: Secondary | ICD-10-CM | POA: Diagnosis not present

## 2018-03-07 DIAGNOSIS — I2699 Other pulmonary embolism without acute cor pulmonale: Secondary | ICD-10-CM | POA: Diagnosis present

## 2018-03-07 DIAGNOSIS — R0602 Shortness of breath: Secondary | ICD-10-CM | POA: Diagnosis not present

## 2018-03-07 DIAGNOSIS — Z8249 Family history of ischemic heart disease and other diseases of the circulatory system: Secondary | ICD-10-CM | POA: Diagnosis not present

## 2018-03-07 DIAGNOSIS — I82451 Acute embolism and thrombosis of right peroneal vein: Secondary | ICD-10-CM | POA: Diagnosis present

## 2018-03-07 DIAGNOSIS — C3492 Malignant neoplasm of unspecified part of left bronchus or lung: Secondary | ICD-10-CM | POA: Diagnosis not present

## 2018-03-07 DIAGNOSIS — Z79890 Hormone replacement therapy (postmenopausal): Secondary | ICD-10-CM | POA: Diagnosis not present

## 2018-03-07 DIAGNOSIS — I2609 Other pulmonary embolism with acute cor pulmonale: Secondary | ICD-10-CM | POA: Diagnosis present

## 2018-03-07 DIAGNOSIS — Z91010 Allergy to peanuts: Secondary | ICD-10-CM | POA: Diagnosis not present

## 2018-03-07 DIAGNOSIS — Z90710 Acquired absence of both cervix and uterus: Secondary | ICD-10-CM | POA: Diagnosis not present

## 2018-03-07 DIAGNOSIS — I444 Left anterior fascicular block: Secondary | ICD-10-CM | POA: Diagnosis present

## 2018-03-07 DIAGNOSIS — C3412 Malignant neoplasm of upper lobe, left bronchus or lung: Secondary | ICD-10-CM | POA: Diagnosis present

## 2018-03-07 DIAGNOSIS — E039 Hypothyroidism, unspecified: Secondary | ICD-10-CM | POA: Diagnosis present

## 2018-03-07 DIAGNOSIS — I361 Nonrheumatic tricuspid (valve) insufficiency: Secondary | ICD-10-CM | POA: Diagnosis not present

## 2018-03-07 DIAGNOSIS — Z79899 Other long term (current) drug therapy: Secondary | ICD-10-CM | POA: Diagnosis not present

## 2018-03-07 DIAGNOSIS — C787 Secondary malignant neoplasm of liver and intrahepatic bile duct: Secondary | ICD-10-CM | POA: Diagnosis present

## 2018-03-07 DIAGNOSIS — I82441 Acute embolism and thrombosis of right tibial vein: Secondary | ICD-10-CM | POA: Diagnosis present

## 2018-03-07 DIAGNOSIS — I82431 Acute embolism and thrombosis of right popliteal vein: Secondary | ICD-10-CM | POA: Diagnosis present

## 2018-03-07 DIAGNOSIS — Z803 Family history of malignant neoplasm of breast: Secondary | ICD-10-CM | POA: Diagnosis not present

## 2018-03-07 DIAGNOSIS — Z800 Family history of malignant neoplasm of digestive organs: Secondary | ICD-10-CM | POA: Diagnosis not present

## 2018-03-07 LAB — CBC WITH DIFFERENTIAL/PLATELET
ABS IMMATURE GRANULOCYTES: 0.19 10*3/uL — AB (ref 0.00–0.07)
BASOS ABS: 0 10*3/uL (ref 0.0–0.1)
Basophils Relative: 0 %
Eosinophils Absolute: 0 10*3/uL (ref 0.0–0.5)
Eosinophils Relative: 0 %
HEMATOCRIT: 36.1 % (ref 36.0–46.0)
HEMOGLOBIN: 11.7 g/dL — AB (ref 12.0–15.0)
IMMATURE GRANULOCYTES: 2 %
LYMPHS ABS: 0.6 10*3/uL — AB (ref 0.7–4.0)
Lymphocytes Relative: 8 %
MCH: 29.9 pg (ref 26.0–34.0)
MCHC: 32.4 g/dL (ref 30.0–36.0)
MCV: 92.3 fL (ref 80.0–100.0)
Monocytes Absolute: 0.2 10*3/uL (ref 0.1–1.0)
Monocytes Relative: 2 %
NEUTROS ABS: 7.2 10*3/uL (ref 1.7–7.7)
NEUTROS PCT: 88 %
NRBC: 0 % (ref 0.0–0.2)
Platelets: 82 10*3/uL — ABNORMAL LOW (ref 150–400)
RBC: 3.91 MIL/uL (ref 3.87–5.11)
RDW: 14.1 % (ref 11.5–15.5)
WBC: 8.2 10*3/uL (ref 4.0–10.5)

## 2018-03-07 LAB — ECHOCARDIOGRAM COMPLETE
Height: 66 in
Weight: 3440 oz

## 2018-03-07 LAB — HIV ANTIBODY (ROUTINE TESTING W REFLEX): HIV Screen 4th Generation wRfx: NONREACTIVE

## 2018-03-07 LAB — PROTIME-INR
INR: 1.07
Prothrombin Time: 13.8 seconds (ref 11.4–15.2)

## 2018-03-07 LAB — BASIC METABOLIC PANEL
Anion gap: 11 (ref 5–15)
BUN: 15 mg/dL (ref 8–23)
CALCIUM: 9.3 mg/dL (ref 8.9–10.3)
CO2: 23 mmol/L (ref 22–32)
CREATININE: 0.75 mg/dL (ref 0.44–1.00)
Chloride: 110 mmol/L (ref 98–111)
GFR calc Af Amer: >60 mL/min (ref >60)
GFR calc non Af Amer: >60 mL/min (ref >60)
GLUCOSE: 148 mg/dL — AB (ref 70–99)
Potassium: 3.1 mmol/L — ABNORMAL LOW (ref 3.5–5.1)
Sodium: 144 mmol/L (ref 135–145)

## 2018-03-07 LAB — APTT: APTT: 173 s — AB (ref 24–36)

## 2018-03-07 LAB — HEPARIN LEVEL (UNFRACTIONATED)
Heparin Unfractionated: 0.87 IU/mL — ABNORMAL HIGH (ref 0.30–0.70)
Heparin Unfractionated: 0.84 IU/mL — ABNORMAL HIGH (ref 0.30–0.70)

## 2018-03-07 MED ORDER — HEPARIN (PORCINE) IN NACL 100-0.45 UNIT/ML-% IJ SOLN: 850.0000 [IU]/h | INTRAMUSCULAR | Status: AC

## 2018-03-07 MED ORDER — BIOTENE DRY MOUTH MT LIQD: 15.0000 mL | OROMUCOSAL | Status: DC | PRN

## 2018-03-07 MED ORDER — SPIRONOLACTONE 25 MG PO TABS
25.0000 mg | ORAL_TABLET | Freq: Every day | ORAL | Status: DC
  Administered 2018-03-07 – 2018-03-09 (×3): 25 mg via ORAL
  Filled 2018-03-07 (×3): qty 1

## 2018-03-07 MED ORDER — SODIUM CHLORIDE 0.9 % IV SOLN
INTRAVENOUS | Status: AC
  Administered 2018-03-07: 02:00:00 via INTRAVENOUS

## 2018-03-07 MED ORDER — HEPARIN BOLUS VIA INFUSION
4000.0000 [IU] | Freq: Once | INTRAVENOUS | Status: AC
  Administered 2018-03-07: 4000 [IU] via INTRAVENOUS
  Filled 2018-03-07: qty 4000

## 2018-03-07 MED ORDER — ACETAMINOPHEN 650 MG RE SUPP: 650.0000 mg | Freq: Four times a day (QID) | RECTAL | Status: DC | PRN

## 2018-03-07 MED ORDER — LEVOTHYROXINE SODIUM 50 MCG PO TABS
50.0000 ug | ORAL_TABLET | Freq: Every day | ORAL | Status: DC
  Administered 2018-03-07 – 2018-03-09 (×3): 50 ug via ORAL
  Filled 2018-03-07 (×3): qty 1

## 2018-03-07 MED ORDER — ORAL CARE MOUTH RINSE
15.0000 mL | Freq: Two times a day (BID) | OROMUCOSAL | Status: DC
  Administered 2018-03-07 – 2018-03-09 (×4): 15 mL via OROMUCOSAL

## 2018-03-07 MED ORDER — HEPARIN (PORCINE) IN NACL 100-0.45 UNIT/ML-% IJ SOLN
1300.0000 [IU]/h | INTRAMUSCULAR | Status: DC
  Administered 2018-03-07: 1300 [IU]/h via INTRAVENOUS
  Filled 2018-03-07: qty 250

## 2018-03-07 MED ORDER — ONDANSETRON HCL 4 MG PO TABS: 4.0000 mg | ORAL_TABLET | Freq: Four times a day (QID) | ORAL | Status: DC | PRN

## 2018-03-07 MED ORDER — SODIUM CHLORIDE 0.9% FLUSH: 3.0000 mL | INTRAVENOUS | Status: DC | PRN

## 2018-03-07 MED ORDER — CALCIUM CARBONATE 1250 (500 CA) MG PO TABS
1250.0000 mg | ORAL_TABLET | Freq: Every day | ORAL | Status: DC
  Administered 2018-03-07 – 2018-03-09 (×3): 1250 mg via ORAL
  Filled 2018-03-07 (×3): qty 1

## 2018-03-07 MED ORDER — HEPARIN (PORCINE) IN NACL 100-0.45 UNIT/ML-% IJ SOLN
1150.0000 [IU]/h | INTRAMUSCULAR | Status: DC
  Administered 2018-03-07: 1150 [IU]/h via INTRAVENOUS
  Filled 2018-03-07: qty 250

## 2018-03-07 MED ORDER — SODIUM CHLORIDE 0.9% FLUSH
3.0000 mL | Freq: Two times a day (BID) | INTRAVENOUS | Status: DC
  Administered 2018-03-08: 3 mL via INTRAVENOUS

## 2018-03-07 MED ORDER — POTASSIUM CHLORIDE CRYS ER 20 MEQ PO TBCR
40.0000 meq | EXTENDED_RELEASE_TABLET | ORAL | Status: AC
  Administered 2018-03-07 (×2): 40 meq via ORAL
  Filled 2018-03-07 (×2): qty 2

## 2018-03-07 MED ORDER — ACETAMINOPHEN 325 MG PO TABS: 650.0000 mg | ORAL_TABLET | Freq: Four times a day (QID) | ORAL | Status: DC | PRN

## 2018-03-07 MED ORDER — OXYCODONE-ACETAMINOPHEN 5-325 MG PO TABS: 1.0000 | ORAL_TABLET | Freq: Four times a day (QID) | ORAL | Status: DC | PRN

## 2018-03-07 MED ORDER — ONDANSETRON HCL 4 MG/2ML IJ SOLN: 4.0000 mg | Freq: Four times a day (QID) | INTRAMUSCULAR | Status: DC | PRN

## 2018-03-07 MED ORDER — SODIUM CHLORIDE 0.9% FLUSH: 3.0000 mL | Freq: Two times a day (BID) | INTRAVENOUS | Status: DC

## 2018-03-07 MED ORDER — SODIUM CHLORIDE 0.9 % IV SOLN: 250.0000 mL | INTRAVENOUS | Status: DC | PRN

## 2018-03-07 NOTE — Plan of Care (Signed)
Patient stable during 7 a to 7 p shift, maintains oxygen saturation in the mid to high 90's on 2 liters however becomes very dyspneic with any exertion and it takes a few minutes for this to subside once back in bed.  Denies any pain, tolerating diet well.  Husband and daughter at bedside.

## 2018-03-07 NOTE — Progress Notes (Signed)
*  PRELIMINARY RESULTS* Echocardiogram 2D Echocardiogram has been performed.  Debra Hodges 03/07/2018, 2:48 PM

## 2018-03-07 NOTE — Progress Notes (Signed)
LE venous duplex prelim: DVT noted in the right popliteal, posterior tibial, and peroneal veins. No DVT LLE. Landry Mellow, RDMS, RVT

## 2018-03-07 NOTE — Plan of Care (Signed)
67 year old female admitted this morning by my partner patient has a history of non-small cell carcinoma of the left lung with liver mets and hypothyroidism admitted with acute pulmonary embolism.  Patient started on IV heparin.  We will start her on p.o. anticoagulation in 48 to 72 hours.  Follow-up echo and Doppler of the lower extremities.

## 2018-03-07 NOTE — H&P (Addendum)
History and Physical    Debra Hodges KTG:[id number] DOB: Aug 16, 1950 DOA: 03/06/2018  PCP: System, Pcp Not In   Patient coming from: Home   Chief Complaint: SOB   HPI: Debra Hodges is a 67 y.o. 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  Patient last underwent chemotherapy on 02/21/2018, was noted to have neutropenia following this, but remained in her usual state until approximately 2 days ago when she developed shortness of breath.  Since that time, she has had dyspnea at rest, worse with any exertion, but denies any fevers, chills, cough, chest pain, or swelling or tenderness in the lower extremities.  She denies any recent travel or prolonged immobile sedation and denies any history of PE or DVT.  She denies any history of easy bleeding or bruising and denies melena or hematochezia.  ED Course: Upon arrival to the ED, patient is found to be afebrile, saturating low 90s on room air, mildly tachycardic, tachypnea, and with stable blood pressure.  EKG features a sinus tachycardia with rate 107, PVC, and LAFB.  Chest x-ray demonstrates the known left-sided lung mass.  BNP is normal and troponin is negative.  Chemistry panel is unremarkable and CBC not yet performed.  CTA chest reveals acute pulmonary embolism within the right main pulmonary artery extending to the right lower lobe with RV/LV ratio 1.06.  Also noted on CTA is the known lung mass in the left upper lobe and lingula with concern for possible progression into the left lower lobe.  Patient was started on IV heparin infusion in the ED, remains hemodynamically stable, denies any chest pain, and will be observed in the hospital for ongoing evaluation and management.  Review of Systems:  All other systems reviewed and apart from HPI, are negative.  Past Medical History:    Diagnosis Date  . High blood pressure   . Liver lesion 06/26/2016  . Non-small cell carcinoma of lung, stage 4 ([place]) dx'd 10/2013    Past Surgical History:  Procedure Laterality Date  . ABDOMINAL HYSTERECTOMY    . BREAST BIOPSY     Right breast  . VIDEO BRONCHOSCOPY Bilateral 10/27/2013   Procedure: VIDEO BRONCHOSCOPY WITH FLUORO;  Surgeon: Collene Gobble, MD;  Location: [facility];  Service: Cardiopulmonary;  Laterality: Bilateral;     reports that she has never smoked. She has never used smokeless tobacco. She reports that she does not drink alcohol or use drugs.  Allergies  Allergen Reactions  . Peanut-Containing Drug Products     Excessive mucous  . Penicillins     Hives, Childhood Allergy Has patient had a PCN reaction causing immediate rash, facial/tongue/throat swelling, SOB or lightheadedness with hypotension: Yes Has patient had a PCN reaction causing severe rash involving mucus membranes or skin necrosis: No Has patient had a PCN reaction that required hospitalization: No Has patient had a PCN reaction occurring within the last 10 years: No If all of the above answers are "NO", then may proceed with Cephalosporin use.     Family History  Problem Relation Age of Onset  . High blood pressure Mother   . Cancer Mother        skin  . Cancer Father        esophageal  . Cancer Maternal Grandfather        breast     Prior to Admission medications  Medication Sig Start Date End Date Taking? Authorizing Provider  acetaminophen (TYLENOL) 325 MG tablet Take 650 mg by mouth every 6 (six) hours as needed. Reported on 06/15/2015   Yes [provider]  calcium carbonate (OS-CAL) 600 MG TABS tablet Take 600 mg by mouth daily.    Yes [provider]  CLIMARA 0.1 MG/24HR patch 1 patch once a week. 08/12/13  Yes [provider]  levothyroxine (SYNTHROID, LEVOTHROID) 50 MCG tablet Take 50 mcg by mouth daily. 11/13/16  Yes [provider]   loperamide (IMODIUM) 2 MG capsule Take by mouth as needed for diarrhea or loose stools. Reported on 11/22/2015   Yes [provider]  loratadine (CLARITIN) 10 MG tablet Take 10 mg by mouth daily.   Yes [provider]  Multiple Vitamins-Calcium (ONE-A-DAY WOMENS PO) Take 1 tablet by mouth daily.   Yes [provider]  oxyCODONE-acetaminophen (PERCOCET/ROXICET) 5-325 MG tablet Take 1 tablet by mouth every 6 (six) hours as needed for severe pain. 02/11/18  Yes Curt Bears, MD  potassium chloride SA (K-DUR,KLOR-CON) 20 MEQ tablet 1 tablet daily.  07/22/13  Yes [provider]  prochlorperazine (COMPAZINE) 10 MG tablet TAKE 1 TABLET(10 MG) BY MOUTH EVERY 6 HOURS AS NEEDED FOR NAUSEA OR VOMITING Patient taking differently: Take 10 mg by mouth every 6 (six) hours as needed for nausea or vomiting.  02/11/18  Yes Curt Bears, MD  Sodium Fluoride (CLINPRO 5000) 1.1 % PSTE Place 1 application onto teeth 3 (three) times daily.   Yes [provider]  spironolactone (ALDACTONE) 25 MG tablet 1 tablet daily. 10/02/13  Yes [provider]  vitamin C (ASCORBIC ACID) 500 MG tablet Take 500 mg by mouth daily.   Yes [provider]  osimertinib mesylate (TAGRISSO) 80 MG tablet Take 1 tablet (80 mg total) by mouth daily. Patient not taking: Reported on 03/06/2018 01/30/18   Curt Bears, MD    Physical Exam: Vitals:   03/06/18 2140 03/06/18 2154 03/06/18 2200 03/06/18 2332  BP: (!) 159/84  (!) 146/75 (!) 134/57  Pulse:  100 (!) 104 (!) 109  Resp: (!) 22 (!) 27 (!) 31 19  Temp:      TempSrc:      SpO2:  96% 93% 100%  Weight:      Height:        Constitutional: NAD, calm  Eyes: PERTLA, lids and conjunctivae normal ENMT: Mucous membranes are moist. Posterior pharynx clear of any exudate or lesions.   Neck: normal, supple, no masses, no thyromegaly Respiratory: Mild dyspnea with speech, clear to auscultation bilaterally, no wheezing, no  crackles. Normal respiratory effort.   Cardiovascular: Rate ~110 and regular. Trace pretibial edema bilaterally. No JVD.  Abdomen: No distension, no tenderness, soft. Bowel sounds normal.  Musculoskeletal: no clubbing / cyanosis. No joint deformity upper and lower extremities.   Skin: no significant rashes, lesions, ulcers. Warm, dry, well-perfused. Neurologic: CN 2-12 grossly intact. Sensation intact. Strength 5/5 in all 4 limbs.  Psychiatric: Alert and oriented x 3. Calm, cooperative.    Labs on Admission: I have personally reviewed following labs and imaging studies  CBC: Recent Labs  Lab 02/28/18 0811  WBC 1.4*  NEUTROABS 0.2*  HGB 11.3*  HCT 33.5*  MCV 90.9  PLT 36*   Basic Metabolic Panel: Recent Labs  Lab 02/28/18 0811 03/06/18 2209  NA 138 141  K 4.1 4.0  CL 102 107  CO2 26 24  GLUCOSE 104* 98  BUN 12 19  CREATININE 0.72 0.76  CALCIUM 8.9 9.4   GFR: Estimated Creatinine Clearance: 80.4 mL/min (by C-G formula based on SCr of 0.76 mg/dL). Liver Function Tests: Recent Labs  Lab 02/28/18 0811  AST 31  ALT 26  ALKPHOS 143*  BILITOT 0.5  PROT 6.9  ALBUMIN 3.4*   No results for input(s): LIPASE, AMYLASE in the last 168 hours. No results for input(s): AMMONIA in the last 168 hours. Coagulation Profile: No results for input(s): INR, PROTIME in the last 168 hours. Cardiac Enzymes: No results for input(s): CKTOTAL, CKMB, CKMBINDEX, TROPONINI in the last 168 hours. BNP (last 3 results) No results for input(s): PROBNP in the last 8760 hours. HbA1C: No results for input(s): HGBA1C in the last 72 hours. CBG: No results for input(s): GLUCAP in the last 168 hours. Lipid Profile: No results for input(s): CHOL, HDL, LDLCALC, TRIG, CHOLHDL, LDLDIRECT in the last 72 hours. Thyroid Function Tests: No results for input(s): TSH, T4TOTAL, FREET4, T3FREE, THYROIDAB in the last 72 hours. Anemia Panel: No results for input(s): VITAMINB12, FOLATE, FERRITIN, TIBC, IRON,  RETICCTPCT in the last 72 hours. Urine analysis:    Component Value Date/Time   PROTEINUR NEGATIVE 02/21/2018 0749   Sepsis Labs: _0 (procalcitonin:4,lacticidven:4) )No results found for this or any previous visit (from the past 240 hour(s)).   Radiological Exams on Admission: Dg Chest 2 View  Result Date: 03/06/2018 CLINICAL DATA:  Shortness of breath EXAM: CHEST - 2 VIEW COMPARISON:  Chest CT February 08, 2018 FINDINGS: The heart size and mediastinal contours are within normal limits. Left perihilar mass/consolidation is identified. The right lung is clear. There is no pleural effusion. Focal sclerosis of proximal left humerus is unchanged. IMPRESSION: Left perihilar mass/consolidation is identified. The right lung is clear. Electronically Signed   By: Abelardo Diesel M.D.   On: 03/06/2018 21:15   Ct Angio Chest Pe W And/or Wo Contrast  Result Date: 03/06/2018 CLINICAL DATA:  Dyspnea and chest pain starting 2 days ago. EXAM: CT ANGIOGRAPHY CHEST WITH CONTRAST TECHNIQUE: Multidetector CT imaging of the chest was performed using the standard protocol during bolus administration of intravenous contrast. Multiplanar CT image reconstructions and MIPs were obtained to evaluate the vascular anatomy. CONTRAST:  126m ISOVUE-370 IOPAMIDOL (ISOVUE-370) INJECTION 76% COMPARISON:  Multiple prior CTs dating back through 08/07/2017 FINDINGS: Cardiovascular: Positive pulmonary embolus within the distal right main pulmonary artery extending into the right lower lobe lobar and segmental branches. RV/LV ratio 1.06 consistent with right heart strain. Heart size is normal. No pericardial effusion or thickening. Nonaneurysmal thoracic aorta. Mediastinum/Nodes: No change and subcentimeter short axis left para soft GIA lymph node now estimated 8 mm versus 9 mm short axis. No mediastinal adenopathy. The esophagus is unremarkable. Trachea is patent. Mainstem bronchi are patent. Slight luminal narrowing from  extrinsic slight mass effect of the left upper lobe and lingular bronchi. Lungs/Pleura: Smaller left suprahilar upper lobe masslike opacity, series 4/33 measuring 18 x 17 mm versus 26 x 20 mm previously, series 10/49. Perihilar masslike opacity in the left hilar region predominantly within the left upper lobe in appearance with new bandlike subpleural area of atelectasis and/or scarring in the left upper lobe, series 10/57. Masslike opacity adjacent to the major fissure within the lingula is redemonstrated also stable in appearance measuring approximately 25 x 20 mm and 26 x 19 mm previously upon my re-measurements. New nonspecific spiculated opacities occupying the superior segment of left lower lobe are new since prior. Although findings may be postinfectious or inflammatory in etiology,  the possibility of neoplastic disease accounting for the spiculated left lower lobe opacities is not excluded. Right lung remains relatively clear and stable. Upper Abdomen: Subtle hypodense mass in the right hepatic lobe measuring 26 x 25 mm is slightly larger versus 25 x 22 mm previously. Additional lesion in the left hepatic lobe measuring approximately 28 x 26 mm is stable. A third lesion further caudad in the left hepatic lobe measuring 15 mm diameter is stable. A fourth lesion in the right hepatic lobe adjacent to the gallbladder fossa is stable to slightly smaller at 20 x 17 mm versus 23 x 22 mm previously. Musculoskeletal: Thoracic spine degenerative changes without aggressive osseous lesions. Unchanged mixed sclerotic and cystic lesion of the proximal left humerus possibly cartilaginous. Metastatic disease not entirely excluded. Review of the MIP images confirms the above findings. IMPRESSION: 1. Acute pulmonary emboli within the right main pulmonary artery extending into the right lower lobe with right heart strain, RV/LV ratio 1.06. 2. Redemonstration of ill-defined masslike opacities in the left upper lobe and lingula  with new patchy nonspecific slightly spiculated pulmonary opacities occupying the superior segment of left lower lobe. Progression of metastatic disease is of concern now involving superior segment left lower lobe. Otherwise, the suprahilar and lingular opacities are remained relatively stable. 3. Redemonstration of several hypodense hepatic masses most of which have remained stable to slightly smaller in appearance. These results were called by telephone at the time of interpretation on 03/06/2018 at 11:46 pm to Dr. Isla Pence , who verbally acknowledged these results. Aortic Atherosclerosis (ICD10-I70.0). Electronically Signed   By: Ashley Royalty M.D.   On: 03/06/2018 23:47    EKG: Independently reviewed. Sinus tachycardia (rate 107), PVC, LAFB.   Assessment/Plan   1. Acute pulmonary embolism  - Presents with 2 days of SOB, denies chest pain, cough, fever/chills, or leg swelling or tenderness  - Found to have acute PE within the right main pulmonary artery and right heart strain (RV/LV ratio 1.06)  - Risk factors include malignancy and Climara use  - Troponin is negative, no acute ischemic features on EKG, no hypotension, and she is not hypoxic  - She was started on IV heparin infusion in ED  - Continue cardiac monitoring, continue IV heparin per pharmacy, obtain serial troponin measurements, check echo and doppler LE's    2. Non-small cell cancer of left lung, stage IV  - Follows with oncology for management of NSCLC with liver met  - She is s/p treatment with Tarceva, Tagrisso, and radiation, currently on systemic chemo q3wks with last infusion 9/26 - CTA chest findings worrisome for possible progression involving LLL    - She was neutropenic a week ago, no infectious s/s, will check CBC now   3. Hypothyroidism  - TSH normal late last month  - Continue Synthroid    DVT prophylaxis: IV heparin infusion  Code Status: Full  Family Communication: Husband updated at bedside Consults  called: None Admission status: Observation     Vianne Bulls, MD [facility] Pager [phone]  If 7PM-7AM, please contact night-coverage [email or web address] Password [email or web address]  03/07/2018, 12:13 AM

## 2018-03-07 NOTE — Progress Notes (Signed)
[place] for Heparin Indication: pulmonary embolus  Allergies  Allergen Reactions  . Peanut-Containing Drug Products     Excessive mucous  . Penicillins     Hives, Childhood Allergy Has patient had a PCN reaction causing immediate rash, facial/tongue/throat swelling, SOB or lightheadedness with hypotension: Yes Has patient had a PCN reaction causing severe rash involving mucus membranes or skin necrosis: No Has patient had a PCN reaction that required hospitalization: No Has patient had a PCN reaction occurring within the last 10 years: No If all of the above answers are "NO", then may proceed with Cephalosporin use.     Patient Measurements: Height: 5\' 6"  (167.6 cm) Weight: 215 lb (97.5 kg) IBW/kg (Calculated) : 59.3 Heparin Dosing Weight: 81.1 kg  Vital Signs: Temp: 98.4 F (36.9 C) (10/10 0900) Temp Source: Oral (10/10 0900) BP: 137/81 (10/10 0900) Pulse Rate: 116 (10/10 0900)  Labs: Recent Labs    03/06/18 2209 03/07/18 0305 03/07/18 0856  HGB  --  11.7*  --   HCT  --  36.1  --   PLT  --  82*  --   APTT  --  173*  --   LABPROT  --  13.8  --   INR  --  1.07  --   HEPARINUNFRC  --   --  0.87*  CREATININE 0.76 0.75  --     Estimated Creatinine Clearance: 80.4 mL/min (by C-G formula based on SCr of 0.75 mg/dL).  Assessment: Patient with new PE.  No oral anticoagulants noted on med rec.   Baseline PT/PTT drawn after heparin drip started.  Baseline aPTT elevated 173 2nd drawn after heparin bolus. Baseline PLTC low at 82.  PLTC was 36 on 10/3 (pancytopenia 2nd chemotx).   First heparin level drawn 8.5 hrs after 4000 unit bolus and heparin drip at 1300 units/hr = 0.87, higher than goal.  No bleeding reported. Heparin off for 5 minutes per RN notes.   Goal of Therapy:  Heparin level 0.3-0.7 units/ml Monitor platelets by anticoagulation protocol: Yes   Plan:  Decrease Heparin drip to 1150 units/hr and check 8 hr HL Daily CBC  and HL while on heparin F/u transition to DOAC or long term LMWH  Eudelia Bunch, Pharm.D [phone] 03/07/2018 9:41 AM

## 2018-03-07 NOTE — Progress Notes (Signed)
ANTICOAGULATION CONSULT NOTE - Initial Consult  Pharmacy Consult for Heparin Indication: pulmonary embolus  Allergies  Allergen Reactions  . Peanut-Containing Drug Products     Excessive mucous  . Penicillins     Hives, Childhood Allergy Has patient had a PCN reaction causing immediate rash, facial/tongue/throat swelling, SOB or lightheadedness with hypotension: Yes Has patient had a PCN reaction causing severe rash involving mucus membranes or skin necrosis: No Has patient had a PCN reaction that required hospitalization: No Has patient had a PCN reaction occurring within the last 10 years: No If all of the above answers are "NO", then may proceed with Cephalosporin use.     Patient Measurements: Height: 5\' 6"  (167.6 cm) Weight: 215 lb (97.5 kg) IBW/kg (Calculated) : 59.3 Heparin Dosing Weight:   Vital Signs: Temp: 97.8 F (36.6 C) (10/09 2033) Temp Source: Oral (10/09 2033) BP: 134/57 (10/09 2332) Pulse Rate: 109 (10/09 2332)  Labs: Recent Labs    03/06/18 2209  CREATININE 0.76    Estimated Creatinine Clearance: 80.4 mL/min (by C-G formula based on SCr of 0.76 mg/dL).   Medical History: Past Medical History:  Diagnosis Date  . High blood pressure   . Liver lesion 06/26/2016  . Non-small cell carcinoma of lung, stage 4 ([place]) dx'd 10/2013    Medications:  Infusions:  . heparin      Assessment: Patient with new PE.  No oral anticoagulants noted on med rec.   Baseline PT/PTT ordered.  Goal of Therapy:  Heparin level 0.3-0.7 units/ml Monitor platelets by anticoagulation protocol: Yes   Plan:  Heparin bolus 4000  units iv x1 Heparin drip at 1300 units/hr Daily CBC Next heparin level at [place], Shea St[place] 03/07/2018,12:12 AM

## 2018-03-07 NOTE — ED Notes (Signed)
ED TO INPATIENT HANDOFF REPORT  Name/Age/Gender Debra Hodges 67 y.o. female  Code Status    Code Status Orders  (From admission, onward)         Start     Ordered   03/07/18 0011  Full code  Continuous     03/07/18 0012        Code Status History    This patient has a current code status but no historical code status.      Home/SNF/Other Home  Chief Complaint chemo pt shortness of breath  Level of Care/Admitting Diagnosis ED Disposition    ED Disposition Condition Comment   Admit  Hospital Area: [facility] [[id number]]  Level of Care: Telemetry [5]  Admit to tele based on following criteria: Monitor for Ischemic changes  Diagnosis: Acute pulmonary embolism [facility]) [[id number]]  Admitting Physician: Vianne Bulls [[id number]]  Attending Physician: Vianne Bulls [[id number]]  PT Class (Do Not Modify): Observation [104]  PT Acc Code (Do Not Modify): Observation [10022]       Medical History Past Medical History:  Diagnosis Date  . High blood pressure   . Liver lesion 06/26/2016  . Non-small cell carcinoma of lung, stage 4 ([place]) dx'd 10/2013    Allergies Allergies  Allergen Reactions  . Peanut-Containing Drug Products     Excessive mucous  . Penicillins     Hives, Childhood Allergy Has patient had a PCN reaction causing immediate rash, facial/tongue/throat swelling, SOB or lightheadedness with hypotension: Yes Has patient had a PCN reaction causing severe rash involving mucus membranes or skin necrosis: No Has patient had a PCN reaction that required hospitalization: No Has patient had a PCN reaction occurring within the last 10 years: No If all of the above answers are "NO", then may proceed with Cephalosporin use.     IV Location/Drains/Wounds Patient Lines/Drains/Airways Status   Active Line/Drains/Airways    Name:   Placement date:   Placement time:   Site:   Days:   Peripheral IV 03/06/18 Right Antecubital   03/06/18    2205     Antecubital   1          Labs/Imaging Results for orders placed or performed during the hospital encounter of 03/06/18 (from the past 48 hour(s))  Basic metabolic panel     Status: None   Collection Time: 03/06/18 10:09 PM  Result Value Ref Range   Sodium 141 135 - 145 mmol/L   Potassium 4.0 3.5 - 5.1 mmol/L   Chloride 107 98 - 111 mmol/L   CO2 24 22 - 32 mmol/L   Glucose, Bld 98 70 - 99 mg/dL   BUN 19 8 - 23 mg/dL   Creatinine, Ser 0.76 0.44 - 1.00 mg/dL   Calcium 9.4 8.9 - 10.3 mg/dL   GFR calc non Af Amer >60 >60 mL/min   GFR calc Af Amer >60 >60 mL/min    Comment: (NOTE) The eGFR has been calculated using the CKD EPI equation. This calculation has not been validated in all clinical situations. eGFR's persistently <60 mL/min signify possible Chronic Kidney Disease.    Anion gap 10 5 - 15    Comment: Performed at [facility], [place] [address] [place],  [address]  Brain natriuretic peptide     Status: None   Collection Time: 03/06/18 10:09 PM  Result Value Ref Range   B Natriuretic Peptide 51.0 0.0 - 100.0 pg/mL    Comment: Performed at [facility],  [place] [address] [place], [place] [address]  POCT i-Stat troponin I     Status: None   Collection Time: 03/06/18 10:18 PM  Result Value Ref Range   Troponin i, poc 0.01 0.00 - 0.08 ng/mL   Comment 3            Comment: Due to the release kinetics of cTnI, a negative result within the first hours of the onset of symptoms does not rule out myocardial infarction with certainty. If myocardial infarction is still suspected, repeat the test at appropriate intervals.    Dg Chest 2 View  Result Date: 03/06/2018 CLINICAL DATA:  Shortness of breath EXAM: CHEST - 2 VIEW COMPARISON:  Chest CT February 08, 2018 FINDINGS: The heart size and mediastinal contours are within normal limits. Left perihilar mass/consolidation is identified. The right lung is clear. There is no pleural effusion.  Focal sclerosis of proximal left humerus is unchanged. IMPRESSION: Left perihilar mass/consolidation is identified. The right lung is clear. Electronically Signed   By: Abelardo Diesel M.D.   On: 03/06/2018 21:15   Ct Angio Chest Pe W And/or Wo Contrast  Result Date: 03/06/2018 CLINICAL DATA:  Dyspnea and chest pain starting 2 days ago. EXAM: CT ANGIOGRAPHY CHEST WITH CONTRAST TECHNIQUE: Multidetector CT imaging of the chest was performed using the standard protocol during bolus administration of intravenous contrast. Multiplanar CT image reconstructions and MIPs were obtained to evaluate the vascular anatomy. CONTRAST:  172m ISOVUE-370 IOPAMIDOL (ISOVUE-370) INJECTION 76% COMPARISON:  Multiple prior CTs dating back through 08/07/2017 FINDINGS: Cardiovascular: Positive pulmonary embolus within the distal right main pulmonary artery extending into the right lower lobe lobar and segmental branches. RV/LV ratio 1.06 consistent with right heart strain. Heart size is normal. No pericardial effusion or thickening. Nonaneurysmal thoracic aorta. Mediastinum/Nodes: No change and subcentimeter short axis left para soft GIA lymph node now estimated 8 mm versus 9 mm short axis. No mediastinal adenopathy. The esophagus is unremarkable. Trachea is patent. Mainstem bronchi are patent. Slight luminal narrowing from extrinsic slight mass effect of the left upper lobe and lingular bronchi. Lungs/Pleura: Smaller left suprahilar upper lobe masslike opacity, series 4/33 measuring 18 x 17 mm versus 26 x 20 mm previously, series 10/49. Perihilar masslike opacity in the left hilar region predominantly within the left upper lobe in appearance with new bandlike subpleural area of atelectasis and/or scarring in the left upper lobe, series 10/57. Masslike opacity adjacent to the major fissure within the lingula is redemonstrated also stable in appearance measuring approximately 25 x 20 mm and 26 x 19 mm previously upon my re-measurements.  New nonspecific spiculated opacities occupying the superior segment of left lower lobe are new since prior. Although findings may be postinfectious or inflammatory in etiology, the possibility of neoplastic disease accounting for the spiculated left lower lobe opacities is not excluded. Right lung remains relatively clear and stable. Upper Abdomen: Subtle hypodense mass in the right hepatic lobe measuring 26 x 25 mm is slightly larger versus 25 x 22 mm previously. Additional lesion in the left hepatic lobe measuring approximately 28 x 26 mm is stable. A third lesion further caudad in the left hepatic lobe measuring 15 mm diameter is stable. A fourth lesion in the right hepatic lobe adjacent to the gallbladder fossa is stable to slightly smaller at 20 x 17 mm versus 23 x 22 mm previously. Musculoskeletal: Thoracic spine degenerative changes without aggressive osseous lesions. Unchanged mixed sclerotic and cystic lesion of the proximal left humerus possibly cartilaginous. Metastatic  disease not entirely excluded. Review of the MIP images confirms the above findings. IMPRESSION: 1. Acute pulmonary emboli within the right main pulmonary artery extending into the right lower lobe with right heart strain, RV/LV ratio 1.06. 2. Redemonstration of ill-defined masslike opacities in the left upper lobe and lingula with new patchy nonspecific slightly spiculated pulmonary opacities occupying the superior segment of left lower lobe. Progression of metastatic disease is of concern now involving superior segment left lower lobe. Otherwise, the suprahilar and lingular opacities are remained relatively stable. 3. Redemonstration of several hypodense hepatic masses most of which have remained stable to slightly smaller in appearance. These results were called by telephone at the time of interpretation on 03/06/2018 at 11:46 pm to Dr. Isla Pence , who verbally acknowledged these results. Aortic Atherosclerosis (ICD10-I70.0).  Electronically Signed   By: Ashley Royalty M.D.   On: 03/06/2018 23:47   None  Pending Labs Unresulted Labs (From admission, onward)    Start     Ordered   03/08/18 0500  CBC  Daily,   R     03/07/18 0009   03/08/18 0200  Troponin I (q 6hr x 3)  Now then every 6 hours,   R     03/07/18 0004   03/07/18 1000  Heparin level (unfractionated)  Once-Timed,   STAT     03/07/18 0007   03/07/18 0500  HIV antibody (Routine Testing)  Tomorrow morning,   R     03/07/18 0012   03/07/18 0500  CBC  Tomorrow morning,   R     03/07/18 0012   03/07/18 4166  Basic metabolic panel  Tomorrow morning,   R     03/07/18 0012   03/07/18 0008  Protime-INR  Once-Timed,   R     03/07/18 0007   03/07/18 0008  APTT  Once,   R     03/07/18 0007   03/07/18 0003  CBC with Differential/Platelet  STAT,   R     03/07/18 0002          Vitals/Pain Today's Vitals   03/07/18 0030 03/07/18 0100 03/07/18 0130 03/07/18 0207  BP: (!) 143/77 (!) 143/63 137/67 (!) 144/71  Pulse: (!) 125 (!) 122 (!) 116 (!) 122  Resp: (!) 28 (!) 28 (!) 26 20  Temp:    98.3 F (36.8 C)  TempSrc:    Oral  SpO2: 98% 97% 96% 95%  Weight:      Height:      PainSc:        Isolation Precautions No active isolations  Medications Medications  iopamidol (ISOVUE-370) 76 % injection (has no administration in time range)  heparin ADULT infusion 100 units/mL (25000 units/21m sodium chloride 0.45%) (1,300 Units/hr Intravenous Transfusing/Transfer 03/07/18 0106)  oxyCODONE-acetaminophen (PERCOCET/ROXICET) 5-325 MG per tablet 1 tablet (has no administration in time range)  levothyroxine (SYNTHROID, LEVOTHROID) tablet 50 mcg (has no administration in time range)  calcium carbonate (OS-CAL) tablet 600 mg (has no administration in time range)  sodium chloride flush (NS) 0.9 % injection 3 mL (has no administration in time range)  sodium chloride flush (NS) 0.9 % injection 3 mL (has no administration in time range)  sodium chloride flush (NS)  0.9 % injection 3 mL (has no administration in time range)  0.9 %  sodium chloride infusion (has no administration in time range)  acetaminophen (TYLENOL) tablet 650 mg (has no administration in time range)    Or  acetaminophen (TYLENOL) suppository 650 mg (  has no administration in time range)  ondansetron (ZOFRAN) tablet 4 mg (has no administration in time range)    Or  ondansetron (ZOFRAN) injection 4 mg (has no administration in time range)  0.9 %  sodium chloride infusion (has no administration in time range)  albuterol (PROVENTIL) (2.5 MG/3ML) 0.083% nebulizer solution 5 mg (5 mg Nebulization Given 03/06/18 2323)  methylPREDNISolone sodium succinate (SOLU-MEDROL) 125 mg/2 mL injection 125 mg (125 mg Intravenous Given 03/06/18 2323)  iopamidol (ISOVUE-370) 76 % injection 100 mL (100 mLs Intravenous Contrast Given 03/06/18 2307)  sodium chloride 0.9 % injection (10 mLs  Given 03/06/18 2324)  heparin bolus via infusion 4,000 Units (4,000 Units Intravenous Bolus from Bag 03/07/18 0028)    Mobility walks with person assist

## 2018-03-07 NOTE — Progress Notes (Signed)
.  CRITICAL VALUE ALERT  Critical Value:  PTT 173  Date & Time Notied:  03/07/18 0600  Provider Notified: Olevia Bowens, MD  Orders Received/Actions taken: Held heparin drip per orders. Notified pharmacy of PTT. Pharmacist Grimsley verbal order to continue heparin drip with no changes to dosage or rate at this time. Heparin drip restarted within 5 minutes.   Pt alert, oriented, and in no distress. Will continue to monitor.

## 2018-03-07 NOTE — Progress Notes (Signed)
[place] for Heparin Indication: pulmonary embolus  Allergies  Allergen Reactions  . Peanut-Containing Drug Products     Excessive mucous  . Penicillins     Hives, Childhood Allergy Has patient had a PCN reaction causing immediate rash, facial/tongue/throat swelling, SOB or lightheadedness with hypotension: Yes Has patient had a PCN reaction causing severe rash involving mucus membranes or skin necrosis: No Has patient had a PCN reaction that required hospitalization: No Has patient had a PCN reaction occurring within the last 10 years: No If all of the above answers are "NO", then may proceed with Cephalosporin use.     Patient Measurements: Height: 5\' 6"  (167.6 cm) Weight: 215 lb (97.5 kg) IBW/kg (Calculated) : 59.3 Heparin Dosing Weight: 81.1 kg  Vital Signs: Temp: 98.3 F (36.8 C) (10/10 1337) Temp Source: Oral (10/10 1337) BP: 146/77 (10/10 1337) Pulse Rate: 113 (10/10 1337)  Labs: Recent Labs    03/06/18 2209 03/07/18 0305 03/07/18 0856 03/07/18 1814  HGB  --  11.7*  --   --   HCT  --  36.1  --   --   PLT  --  82*  --   --   APTT  --  173*  --   --   LABPROT  --  13.8  --   --   INR  --  1.07  --   --   HEPARINUNFRC  --   --  0.87* 0.84*  CREATININE 0.76 0.75  --   --     Estimated Creatinine Clearance: 80.4 mL/min (by C-G formula based on SCr of 0.75 mg/dL).  Assessment: Patient with new PE.  No oral anticoagulants noted on med rec.   Baseline PT/PTT drawn after heparin drip started.  Baseline aPTT elevated 173 2nd drawn after heparin bolus. Baseline PLTC low at 82.  PLTC was 36 on 10/3 (pancytopenia 2nd chemotx).  First heparin level drawn 8.5 hrs after 4000 unit bolus and heparin drip at 1300 units/hr = 0.87, higher than goal.  No bleeding reported. Heparin off for 5 minutes per RN notes.   03/07/18 PM:   Heparin level still SUPRAtherapeutic despite decrease in IV heparin rate from 1300 units/hr to 1150  units/hr.  Per RN, no reported bleeding or issues  Goal of Therapy:  Heparin level 0.3-0.7 units/ml Monitor platelets by anticoagulation protocol: Yes   Plan:  1) Reduce IV heparin from 1150 units/hr to 850 units/hr 2) Recheck IV heparin 6 hours after rate reduction 3) F/u transition to DOAC or long term LMWH   Adrian Saran, PharmD, BCPS Pager [phone] 03/07/2018 6:44 PM

## 2018-03-08 DIAGNOSIS — I2609 Other pulmonary embolism with acute cor pulmonale: Principal | ICD-10-CM

## 2018-03-08 LAB — TROPONIN I
Troponin I: <0.03 ng/mL (ref <0.03)
Troponin I: <0.03 ng/mL (ref <0.03)

## 2018-03-08 LAB — TYPE AND SCREEN
ABO/RH(D): A POS
ANTIBODY SCREEN: NEGATIVE

## 2018-03-08 LAB — CBC
HEMATOCRIT: 33.4 % — AB (ref 36.0–46.0)
HEMATOCRIT: 33.7 % — AB (ref 36.0–46.0)
Hemoglobin: 10.9 g/dL — ABNORMAL LOW (ref 12.0–15.0)
Hemoglobin: 10.9 g/dL — ABNORMAL LOW (ref 12.0–15.0)
MCH: 30.5 pg (ref 26.0–34.0)
MCH: 30.3 pg (ref 26.0–34.0)
MCHC: 32.6 g/dL (ref 30.0–36.0)
MCHC: 32.3 g/dL (ref 30.0–36.0)
MCV: 93.6 fL (ref 80.0–100.0)
MCV: 93.6 fL (ref 80.0–100.0)
NRBC: 0 % (ref 0.0–0.2)
Platelets: 107 10*3/uL — ABNORMAL LOW (ref 150–400)
Platelets: 113 10*3/uL — ABNORMAL LOW (ref 150–400)
RBC: 3.57 MIL/uL — ABNORMAL LOW (ref 3.87–5.11)
RBC: 3.6 MIL/uL — ABNORMAL LOW (ref 3.87–5.11)
RDW: 14.5 % (ref 11.5–15.5)
RDW: 14.5 % (ref 11.5–15.5)
WBC: 15.5 10*3/uL — ABNORMAL HIGH (ref 4.0–10.5)
WBC: 12.4 10*3/uL — AB (ref 4.0–10.5)
nRBC: 0 % (ref 0.0–0.2)

## 2018-03-08 LAB — HEPARIN LEVEL (UNFRACTIONATED): Heparin Unfractionated: 0.52 IU/mL (ref 0.30–0.70)

## 2018-03-08 LAB — ABO/RH: ABO/RH(D): A POS

## 2018-03-08 MED ORDER — APIXABAN 5 MG PO TABS
10.0000 mg | ORAL_TABLET | Freq: Two times a day (BID) | ORAL | Status: DC
  Administered 2018-03-08 – 2018-03-09 (×2): 10 mg via ORAL
  Filled 2018-03-08 (×2): qty 2

## 2018-03-08 MED ORDER — APIXABAN 5 MG PO TABS: 5.0000 mg | ORAL_TABLET | Freq: Two times a day (BID) | ORAL | Status: DC

## 2018-03-08 NOTE — Progress Notes (Signed)
PROGRESS NOTE    Debra Hodges  DJM:[id number] DOB: [birth year] DOA: 03/06/2018 PCP: System, Pcp Not In   67 y.o. 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  Patient last underwent chemotherapy on 02/21/2018, was noted to have neutropenia following this, but remained in her usual state until approximately 2 days ago when she developed shortness of breath.  Since that time, she has had dyspnea at rest, worse with any exertion, but denies any fevers, chills, cough, chest pain, or swelling or tenderness in the lower extremities.  She denies any recent travel or prolonged immobile sedation and denies any history of PE or DVT.  She denies any history of easy bleeding or bruising and denies melena or hematochezia.  ED Course: Upon arrival to the ED, patient is found to be afebrile, saturating low 90s on room air, mildly tachycardic, tachypnea, and with stable blood pressure.  EKG features a sinus tachycardia with rate 107, PVC, and LAFB.  Chest x-ray demonstrates the known left-sided lung mass.  BNP is normal and troponin is negative.  Chemistry panel is unremarkable and CBC not yet performed.  CTA chest reveals acute pulmonary embolism within the right main pulmonary artery extending to the right lower lobe with RV/LV ratio 1.06.  Also noted on CTA is the known lung mass in the left upper lobe and lingula with concern for possible progression into the left lower lobe.  Patient was started on IV heparin infusion in the ED, remains hemodynamically stable, denies any chest pain, and will be observed in the hospital for ongoing evaluation and management. Brief Narrative: Assessment & Plan:   Principal Problem:   Acute pulmonary embolism ([place]) Active Problems:   Non-small cell carcinoma of lung, stage 4 (HCC)   Hypothyroidism   1. Acute  pulmonary embolism /ACUTE DVT - Presents with 2 days of SOB, denies chest pain, cough, fever/chills, or leg swelling or tenderness  - Found to have acute PE within the right main pulmonary artery and right heart strain (RV/LV ratio 1.06)  - Risk factors include malignancy and Climara use  - Troponin is negative, no acute ischemic features on EKG, no hypotension, and she is not hypoxic  - She was started on IV heparin infusion will start DOAC today Echocardiogram 22/29/7989 systolic function was normal ejection fraction 55 to 60% normal wall motion no regional wall motion abnormalities valve calcified annulus, moderate regurgitation the tricuspid valve systolic pressure was mildly increased in the pulmonary arteries at 33 mmHg.  Ultrasound of the lower extremity showed acute DVT of the right popliteal vein, right posterior tibial vein, right peroneal vein.  No evidence of DVT in the left lower extremity   Ambulate check pulse oximetry  2. Non-small cell cancer of left lung, stage IV  - Follows with oncology for management of NSCLC with liver met  - She is s/p treatment with Tarceva, Tagrisso, and radiation, currently on systemic chemo q3wks with last infusion 9/26 - CTA chest findings worrisome for possible progression involving LLL    - She was neutropenic a week ago, no infectious s/s, will check CBC now   3. Hypothyroidism  - TSH normal late last month  - Continue Synthroid     DVT prophylaxis: HEPARIN Code Status:FULL Family Communication:NONE Disposition Plan:TBD Consultants: NONE   Procedures:  Antimicrobials:  Subjective:   Objective: Vitals:   03/07/18 0900 03/07/18  1337 03/07/18 2213 03/08/18 0437  BP: 137/81 (!) 146/77 (!) 150/89 (!) 149/82  Pulse: (!) 116 (!) 113 97 84  Resp: (!) _0 Temp: 98.4 F (36.9 C) 98.3 F (36.8 C) 98.8 F (37.1 C) 98 F (36.7 C)  TempSrc: Oral Oral Oral Oral  SpO2: 96% 96% 96% 97%  Weight:    96 kg  Height:         Intake/Output Summary (Last 24 hours) at 03/08/2018 1049 Last data filed at 03/08/2018 0942 Gross per 24 hour  Intake 1437.97 ml  Output 700 ml  Net 737.97 ml   Filed Weights   03/06/18 2033 03/08/18 0437  Weight: 97.5 kg 96 kg    Examination:  General exam: Appears calm and comfortable  Respiratory system: Clear to auscultation. Respiratory effort normal. Cardiovascular system: S1 & S2 heard, RRR. No JVD, murmurs, rubs, gallops or clicks. No pedal edema. Gastrointestinal system: Abdomen is nondistended, soft and nontender. No organomegaly or masses felt. Normal bowel sounds heard. Central nervous system: Alert and oriented. No focal neurological deficits. Extremities: Symmetric 5 x 5 power. Skin: No rashes, lesions or ulcers Psychiatry: Judgement and insight appear normal. Mood & affect appropriate.     Data Reviewed: I have personally reviewed following labs and imaging studies  CBC: Recent Labs  Lab 03/07/18 0305 03/08/18 0125 03/08/18 0755  WBC 8.2 15.5* 12.4*  NEUTROABS 7.2  --   --   HGB 11.7* 10.9* 10.9*  HCT 36.1 33.4* 33.7*  MCV 92.3 93.6 93.6  PLT 82* 107* 476*   Basic Metabolic Panel: Recent Labs  Lab 03/06/18 2209 03/07/18 0305  NA 141 144  K 4.0 3.1*  CL 107 110  CO2 24 23  GLUCOSE 98 148*  BUN 19 15  CREATININE 0.76 0.75  CALCIUM 9.4 9.3   GFR: Estimated Creatinine Clearance: 79.7 mL/min (by C-G formula based on SCr of 0.75 mg/dL). Liver Function Tests: No results for input(s): AST, ALT, ALKPHOS, BILITOT, PROT, ALBUMIN in the last 168 hours. No results for input(s): LIPASE, AMYLASE in the last 168 hours. No results for input(s): AMMONIA in the last 168 hours. Coagulation Profile: Recent Labs  Lab 03/07/18 0305  INR 1.07   Cardiac Enzymes: Recent Labs  Lab 03/08/18 0125 03/08/18 0755  TROPONINI <0.03 <0.03   BNP (last 3 results) No results for input(s): PROBNP in the last 8760 hours. HbA1C: No results for input(s): HGBA1C  in the last 72 hours. CBG: No results for input(s): GLUCAP in the last 168 hours. Lipid Profile: No results for input(s): CHOL, HDL, LDLCALC, TRIG, CHOLHDL, LDLDIRECT in the last 72 hours. Thyroid Function Tests: No results for input(s): TSH, T4TOTAL, FREET4, T3FREE, THYROIDAB in the last 72 hours. Anemia Panel: No results for input(s): VITAMINB12, FOLATE, FERRITIN, TIBC, IRON, RETICCTPCT in the last 72 hours. Sepsis Labs: No results for input(s): PROCALCITON, LATICACIDVEN in the last 168 hours.  No results found for this or any previous visit (from the past 240 hour(s)).       Radiology Studies: Dg Chest 2 View  Result Date: 03/06/2018 CLINICAL DATA:  Shortness of breath EXAM: CHEST - 2 VIEW COMPARISON:  Chest CT February 08, 2018 FINDINGS: The heart size and mediastinal contours are within normal limits. Left perihilar mass/consolidation is identified. The right lung is clear. There is no pleural effusion. Focal sclerosis of proximal left humerus is unchanged. IMPRESSION: Left perihilar mass/consolidation is identified. The right lung is clear. Electronically Signed   By: Seward Meth  Augustin Coupe M.D.   On: 03/06/2018 21:15   Ct Angio Chest Pe W And/or Wo Contrast  Result Date: 03/06/2018 CLINICAL DATA:  Dyspnea and chest pain starting 2 days ago. EXAM: CT ANGIOGRAPHY CHEST WITH CONTRAST TECHNIQUE: Multidetector CT imaging of the chest was performed using the standard protocol during bolus administration of intravenous contrast. Multiplanar CT image reconstructions and MIPs were obtained to evaluate the vascular anatomy. CONTRAST:  188m ISOVUE-370 IOPAMIDOL (ISOVUE-370) INJECTION 76% COMPARISON:  Multiple prior CTs dating back through 08/07/2017 FINDINGS: Cardiovascular: Positive pulmonary embolus within the distal right main pulmonary artery extending into the right lower lobe lobar and segmental branches. RV/LV ratio 1.06 consistent with right heart strain. Heart size is normal. No pericardial  effusion or thickening. Nonaneurysmal thoracic aorta. Mediastinum/Nodes: No change and subcentimeter short axis left para soft GIA lymph node now estimated 8 mm versus 9 mm short axis. No mediastinal adenopathy. The esophagus is unremarkable. Trachea is patent. Mainstem bronchi are patent. Slight luminal narrowing from extrinsic slight mass effect of the left upper lobe and lingular bronchi. Lungs/Pleura: Smaller left suprahilar upper lobe masslike opacity, series 4/33 measuring 18 x 17 mm versus 26 x 20 mm previously, series 10/49. Perihilar masslike opacity in the left hilar region predominantly within the left upper lobe in appearance with new bandlike subpleural area of atelectasis and/or scarring in the left upper lobe, series 10/57. Masslike opacity adjacent to the major fissure within the lingula is redemonstrated also stable in appearance measuring approximately 25 x 20 mm and 26 x 19 mm previously upon my re-measurements. New nonspecific spiculated opacities occupying the superior segment of left lower lobe are new since prior. Although findings may be postinfectious or inflammatory in etiology, the possibility of neoplastic disease accounting for the spiculated left lower lobe opacities is not excluded. Right lung remains relatively clear and stable. Upper Abdomen: Subtle hypodense mass in the right hepatic lobe measuring 26 x 25 mm is slightly larger versus 25 x 22 mm previously. Additional lesion in the left hepatic lobe measuring approximately 28 x 26 mm is stable. A third lesion further caudad in the left hepatic lobe measuring 15 mm diameter is stable. A fourth lesion in the right hepatic lobe adjacent to the gallbladder fossa is stable to slightly smaller at 20 x 17 mm versus 23 x 22 mm previously. Musculoskeletal: Thoracic spine degenerative changes without aggressive osseous lesions. Unchanged mixed sclerotic and cystic lesion of the proximal left humerus possibly cartilaginous. Metastatic disease  not entirely excluded. Review of the MIP images confirms the above findings. IMPRESSION: 1. Acute pulmonary emboli within the right main pulmonary artery extending into the right lower lobe with right heart strain, RV/LV ratio 1.06. 2. Redemonstration of ill-defined masslike opacities in the left upper lobe and lingula with new patchy nonspecific slightly spiculated pulmonary opacities occupying the superior segment of left lower lobe. Progression of metastatic disease is of concern now involving superior segment left lower lobe. Otherwise, the suprahilar and lingular opacities are remained relatively stable. 3. Redemonstration of several hypodense hepatic masses most of which have remained stable to slightly smaller in appearance. These results were called by telephone at the time of interpretation on 03/06/2018 at 11:46 pm to Dr. JIsla Pence, who verbally acknowledged these results. Aortic Atherosclerosis (ICD10-I70.0). Electronically Signed   By: DAshley RoyaltyM.D.   On: 03/06/2018 23:47        Scheduled Meds: . calcium carbonate  1,250 mg Oral Daily  . levothyroxine  50 mcg Oral QAC  breakfast  . mouth rinse  15 mL Mouth Rinse BID  . sodium chloride flush  3 mL Intravenous Q12H  . sodium chloride flush  3 mL Intravenous Q12H  . spironolactone  25 mg Oral Daily   Continuous Infusions: . sodium chloride    . heparin 850 Units/hr (03/08/18 0319)     LOS: 1 day     Georgette Shell, MD [facility]  If 7PM-7AM, please contact night-coverage [email or web address] Password [email or web address] 03/08/2018, 10:49 AM

## 2018-03-08 NOTE — Progress Notes (Signed)
Dr. Myna Hidalgo notified at around [place] that pt got out of bed and found puddle of blood from rectal bleeding from prior BM this evening and wiped bright red blood from rectum. Heparin level came back within goal, MD added CBC and type and screen. H&H came back 10.9/33.4. Dr. Myna Hidalgo advised of results and stated he would advise we recheck CBC at 10AM this morning. Will continue to monitor pt closely.

## 2018-03-08 NOTE — Progress Notes (Signed)
SATURATION QUALIFICATIONS: (This note is used to comply with regulatory documentation for home oxygen)  Patient Saturations on Room Air at Rest =98%  Patient Saturations on Room Air while Ambulating =94-97%  Patient Saturations on 0 Liters of oxygen while Ambulating =n/a  Patient tolerated ambulation well. Ambulated about 40 ft in the hallway. Patient reported that though she still had DOE, it was "considerably better than it was when she was admitted." HR maintained 105-110 with ambulation.   Debra Hodges, [place]

## 2018-03-08 NOTE — Progress Notes (Signed)
[place] for Heparin>>Eliquis Indication: pulmonary embolus  Allergies  Allergen Reactions  . Peanut-Containing Drug Products     Excessive mucous  . Penicillins     Hives, Childhood Allergy Has patient had a PCN reaction causing immediate rash, facial/tongue/throat swelling, SOB or lightheadedness with hypotension: Yes Has patient had a PCN reaction causing severe rash involving mucus membranes or skin necrosis: No Has patient had a PCN reaction that required hospitalization: No Has patient had a PCN reaction occurring within the last 10 years: No If all of the above answers are "NO", then may proceed with Cephalosporin use.     Patient Measurements: Height: 5\' 6"  (167.6 cm) Weight: 211 lb 9.6 oz (96 kg) IBW/kg (Calculated) : 59.3 Heparin Dosing Weight: 81.1 kg  Vital Signs: Temp: 98 F (36.7 C) (10/11 0437) Temp Source: Oral (10/11 0437) BP: 149/82 (10/11 0437) Pulse Rate: 84 (10/11 0437)  Labs: Recent Labs    03/06/18 2209  03/07/18 0305 03/07/18 0856 03/07/18 1814 03/08/18 0125 03/08/18 0755  HGB  --    < > 11.7*  --   --  10.9* 10.9*  HCT  --   --  36.1  --   --  33.4* 33.7*  PLT  --   --  82*  --   --  107* 113*  APTT  --   --  173*  --   --   --   --   LABPROT  --   --  13.8  --   --   --   --   INR  --   --  1.07  --   --   --   --   HEPARINUNFRC  --   --   --  0.87* 0.84* 0.52  --   CREATININE 0.76  --  0.75  --   --   --   --   TROPONINI  --   --   --   --   --  <0.03 <0.03   < > = values in this interval not displayed.    Estimated Creatinine Clearance: 79.7 mL/min (by C-G formula based on SCr of 0.75 mg/dL).  Assessment: Patient with new PE.  No oral anticoagulants noted on med rec.   Baseline PT/PTT drawn after heparin drip started.  Baseline aPTT elevated 173 2nd drawn after heparin bolus. Baseline PLTC low at 82.  PLTC was 36 on 10/3 (pancytopenia 2nd chemotx).    03/08/2018  Hg 11.7>10.9; PLCT 82>113; pt  w/ bright red blood  rectal bleeding last night after BM - per pt this is not new for her.  Doppler + for R DVT.    Plan:  DC heparin drip at time of first Eliquis dose Eliquis 10 mg po BID x 7 days followed by Eliquis 5 mg po BID ( to start Saturday October 19) 30 day free trial offer and $10 co-pay card provided for patient Pt education provided  Eudelia Bunch, Pharm.D [phone] 03/08/2018 2:06 PM

## 2018-03-08 NOTE — Progress Notes (Signed)
ANTICOAGULATION CONSULT NOTE - Follow Up Consult  Pharmacy Consult for Heparin Indication: pulmonary embolus  Allergies  Allergen Reactions  . Peanut-Containing Drug Products     Excessive mucous  . Penicillins     Hives, Childhood Allergy Has patient had a PCN reaction causing immediate rash, facial/tongue/throat swelling, SOB or lightheadedness with hypotension: Yes Has patient had a PCN reaction causing severe rash involving mucus membranes or skin necrosis: No Has patient had a PCN reaction that required hospitalization: No Has patient had a PCN reaction occurring within the last 10 years: No If all of the above answers are "NO", then may proceed with Cephalosporin use.     Patient Measurements: Height: 5\' 6"  (167.6 cm) Weight: 215 lb (97.5 kg) IBW/kg (Calculated) : 59.3 Heparin Dosing Weight:   Vital Signs: Temp: 98.8 F (37.1 C) (10/10 2213) Temp Source: Oral (10/10 2213) BP: 150/89 (10/10 2213) Pulse Rate: 97 (10/10 2213)  Labs: Recent Labs    03/06/18 2209 03/07/18 0305 03/07/18 0856 03/07/18 1814 03/08/18 0125  HGB  --  11.7*  --   --  10.9*  HCT  --  36.1  --   --  33.4*  PLT  --  82*  --   --  107*  APTT  --  173*  --   --   --   LABPROT  --  13.8  --   --   --   INR  --  1.07  --   --   --   HEPARINUNFRC  --   --  0.87* 0.84* 0.52  CREATININE 0.76 0.75  --   --   --   TROPONINI  --   --   --   --  <0.03    Estimated Creatinine Clearance: 80.4 mL/min (by C-G formula based on SCr of 0.75 mg/dL).   Medications:  Infusions:  . sodium chloride    . heparin 850 Units/hr (03/08/18 0000)    Assessment: Patient with heparin level at goal.  RN reports that patient has blood from rectum, MD aware and wishes to continue with heparin drip.  RN to continue to monitor.    Goal of Therapy:  Heparin level 0.3-0.7 units/ml Monitor platelets by anticoagulation protocol: Yes   Plan:  Continue heparin drip at current rate Recheck level at [place], [place] 03/08/2018,2:49 AM

## 2018-03-08 NOTE — Discharge Instructions (Signed)
Information on my medicine - ELIQUIS (apixaban)  This medication education was reviewed with me or my healthcare representative as part of my discharge preparation.  The pharmacist that spoke with me during my hospital stay was:  Eudelia Bunch, [facility]  Why was Eliquis prescribed for you? Eliquis was prescribed to treat blood clots that may have been found in the veins of your legs (deep vein thrombosis) or in your lungs (pulmonary embolism) and to reduce the risk of them occurring again.  What do You need to know about Eliquis ? The starting dose is 10 mg (two 5 mg tablets) taken TWICE daily for the FIRST SEVEN (7) DAYS, then on Saturday October 19  the dose is reduced to ONE 5 mg tablet taken TWICE daily.  Eliquis may be taken with or without food.   Try to take the dose about the same time in the morning and in the evening. If you have difficulty swallowing the tablet whole please discuss with your pharmacist how to take the medication safely.  Take Eliquis exactly as prescribed and DO NOT stop taking Eliquis without talking to the doctor who prescribed the medication.  Stopping may increase your risk of developing a new blood clot.  Refill your prescription before you run out.  After discharge, you should have regular check-up appointments with your healthcare provider that is prescribing your Eliquis.    What do you do if you miss a dose? If a dose of ELIQUIS is not taken at the scheduled time, take it as soon as possible on the same day and twice-daily administration should be resumed. The dose should not be doubled to make up for a missed dose.  Important Safety Information A possible side effect of Eliquis is bleeding. You should call your healthcare provider right away if you experience any of the following: ? Bleeding from an injury or your nose that does not stop. ? Unusual colored urine (red or dark brown) or unusual colored stools (red or black). ? Unusual bruising for  unknown reasons. ? A serious fall or if you hit your head (even if there is no bleeding).  Some medicines may interact with Eliquis and might increase your risk of bleeding or clotting while on Eliquis. To help avoid this, consult your healthcare provider or pharmacist prior to using any new prescription or non-prescription medications, including herbals, vitamins, non-steroidal anti-inflammatory drugs (NSAIDs) and supplements.  This website has more information on Eliquis (apixaban): http://www.eliquis.com/eliquis/home

## 2018-03-08 NOTE — Progress Notes (Signed)
Pt do not have part D coverage for medications.  Both Eliquis and Xarelto have patient assistance program which the pt will need to call for herself. The number is located on the card. PharmD will instruct pt on this information.

## 2018-03-09 DIAGNOSIS — C3492 Malignant neoplasm of unspecified part of left bronchus or lung: Secondary | ICD-10-CM

## 2018-03-09 MED ORDER — APIXABAN 5 MG PO TABS: 5.0000 mg | ORAL_TABLET | Freq: Two times a day (BID) | ORAL | 0 refills | Status: DC

## 2018-03-09 MED ORDER — APIXABAN 5 MG PO TABS: 10.0000 mg | ORAL_TABLET | Freq: Two times a day (BID) | ORAL | 0 refills | Status: DC

## 2018-03-09 NOTE — Discharge Summary (Signed)
Physician Discharge Summary  Debra Hodges PIR:[id number] DOB: 07-26-1950 DOA: 03/06/2018  PCP: System, Pcp Not In  Admit date: 03/06/2018 Discharge date: 03/09/2018  Admitted From: Home Disposition: Home  Recommendations for Outpatient Follow-up:  1. Follow up with PCP in 1-2 weeks 2. Please obtain BMP/CBC in one week 3. Follow-up with Dr. Julien Nordmann  Home Health: None Equipment/Devices: None Discharge Condition: Stable CODE STATUS: Full code Diet recommendation: Cardiac Brief/Interim Summary:67 y.o.femalewith medical history significant fornon-small cell carcinoma of the left lung with liver metastasis, and hypothyroidism, now presenting to the emergency department for evaluation of shortness of breath. Patient last underwent chemotherapy on 02/21/2018, was noted to have neutropenia following this, but remained in her usual state until approximately 2 days ago when she developed shortness of breath. Since that time, she has had dyspnea at rest, worse with any exertion, but denies any fevers, chills, cough, chest pain, or swelling or tenderness in the lower extremities. She denies any recent travel or prolonged immobile sedation and denies any history of PE or DVT. She denies any history of easy bleeding or bruising and denies melena or hematochezia.  ED Course:Upon arrival to the ED, patient is found to be afebrile, saturating low 90s on room air, mildly tachycardic, tachypnea, and with stable blood pressure. EKG features a sinus tachycardia with rate 107, PVC, and LAFB. Chest x-ray demonstrates the known left-sided lung mass. BNP is normal and troponin is negative. Chemistry panel is unremarkable and CBC not yet performed. CTA chest reveals acute pulmonary embolism within the right main pulmonary artery extending to the right lower lobe with RV/LV ratio 1.06. Also noted on CTA is the known lung mass in the left upper lobe and lingula with concern for possible progression into the  left lower lobe. Patient was started on IV heparin infusion in the ED, remains hemodynamically stable, denies any chest pain, and will be observed in the hospital for ongoing evaluation and management.   Discharge Diagnoses:  Principal Problem:   Acute pulmonary embolism ([place]) Active Problems:   Non-small cell carcinoma of lung, stage 4 (HCC)   Hypothyroidism  1.Acute pulmonary embolism/ACUTE DVT -Presents with 2 days of SOB, denies chest pain, cough, fever/chills, or leg swelling or tenderness -Found to have acute PE within the right main pulmonary artery and right heart strain (RV/LV ratio 1.06)  - Risk factors include malignancy and Climara use -Troponin is negative, no acute ischemic features on EKG, no hypotension, and she is not hypoxic -She was started on IV heparin infusion  and DOAC was started yesterday.  She did not have any further blood in her stools or any nausea vomiting or hematemesis. Echocardiogram 63/05/6008 systolic function was normal ejection fraction 55 to 60% normal wall motion no regional wall motion abnormalities valve calcified annulus, moderate regurgitation the tricuspid valve systolic pressure was mildly increased in the pulmonary arteries at 33 mmHg.  Ultrasound of the lower extremity showed acute DVT of the right popliteal vein, right posterior tibial vein, right peroneal vein.  No evidence of DVT in the left lower extremity  She will follow-up with Dr. Julien Nordmann next week Ambulate check pulse oximetry  2.Non-small cell cancer of left lung, stage IV -Follows with oncology for management of NSCLC with liver met -She is s/p treatment with Tarceva, Tagrisso, and radiation, currently on systemic chemo q3wks with last infusion 9/26 -CTA chest findings worrisome for possible progression involving LLL 3.Hypothyroidism  - TSH normal late last month -Continue Synthroid  Discharge Instructions  Discharge Instructions  Call MD for:   difficulty breathing, headache or visual disturbances   Complete by:  As directed    Call MD for:  persistant dizziness or light-headedness   Complete by:  As directed    Call MD for:  persistant nausea and vomiting   Complete by:  As directed    Call MD for:  redness, tenderness, or signs of infection (pain, swelling, redness, odor or green/yellow discharge around incision site)   Complete by:  As directed    Diet - low sodium heart healthy   Complete by:  As directed    Increase activity slowly   Complete by:  As directed      Allergies as of 03/09/2018      Reactions   Peanut-containing Drug Products    Excessive mucous   Penicillins    Hives, Childhood Allergy Has patient had a PCN reaction causing immediate rash, facial/tongue/throat swelling, SOB or lightheadedness with hypotension: Yes Has patient had a PCN reaction causing severe rash involving mucus membranes or skin necrosis: No Has patient had a PCN reaction that required hospitalization: No Has patient had a PCN reaction occurring within the last 10 years: No If all of the above answers are "NO", then may proceed with Cephalosporin use.      Medication List    STOP taking these medications   osimertinib mesylate 80 MG tablet Commonly known as:  TAGRISSO     TAKE these medications   acetaminophen 325 MG tablet Commonly known as:  TYLENOL Take 650 mg by mouth every 6 (six) hours as needed. Reported on 06/15/2015   apixaban 5 MG Tabs tablet Commonly known as:  ELIQUIS Take 2 tablets (10 mg total) by mouth 2 (two) times daily.   apixaban 5 MG Tabs tablet Commonly known as:  ELIQUIS Take 1 tablet (5 mg total) by mouth 2 (two) times daily. Start taking on:  03/15/2018   calcium carbonate 600 MG Tabs tablet Commonly known as:  OS-CAL Take 600 mg by mouth daily.   CLIMARA 0.1 mg/24hr patch Generic drug:  estradiol 1 patch once a week.   CLINPRO 5000 1.1 % Pste Generic drug:  Sodium Fluoride Place 1  application onto teeth 3 (three) times daily.   levothyroxine 50 MCG tablet Commonly known as:  SYNTHROID, LEVOTHROID Take 50 mcg by mouth daily.   loperamide 2 MG capsule Commonly known as:  IMODIUM Take by mouth as needed for diarrhea or loose stools. Reported on 11/22/2015   loratadine 10 MG tablet Commonly known as:  CLARITIN Take 10 mg by mouth daily.   ONE-A-DAY WOMENS PO Take 1 tablet by mouth daily.   oxyCODONE-acetaminophen 5-325 MG tablet Commonly known as:  PERCOCET/ROXICET Take 1 tablet by mouth every 6 (six) hours as needed for severe pain.   potassium chloride SA 20 MEQ tablet Commonly known as:  K-DUR,KLOR-CON 1 tablet daily.   prochlorperazine 10 MG tablet Commonly known as:  COMPAZINE TAKE 1 TABLET(10 MG) BY MOUTH EVERY 6 HOURS AS NEEDED FOR NAUSEA OR VOMITING What changed:  See the new instructions.   spironolactone 25 MG tablet Commonly known as:  ALDACTONE 1 tablet daily.   vitamin C 500 MG tablet Commonly known as:  ASCORBIC ACID Take 500 mg by mouth daily.      Follow-up Information    Curt Bears, MD Follow up.   Specialty:  Oncology Contact information: [place] [address] [phone]  Allergies  Allergen Reactions  . Peanut-Containing Drug Products     Excessive mucous  . Penicillins     Hives, Childhood Allergy Has patient had a PCN reaction causing immediate rash, facial/tongue/throat swelling, SOB or lightheadedness with hypotension: Yes Has patient had a PCN reaction causing severe rash involving mucus membranes or skin necrosis: No Has patient had a PCN reaction that required hospitalization: No Has patient had a PCN reaction occurring within the last 10 years: No If all of the above answers are "NO", then may proceed with Cephalosporin use.     Consultations:  none   Procedures/Studies: Dg Chest 2 View  Result Date: 03/06/2018 CLINICAL DATA:  Shortness of breath EXAM: CHEST  - 2 VIEW COMPARISON:  Chest CT February 08, 2018 FINDINGS: The heart size and mediastinal contours are within normal limits. Left perihilar mass/consolidation is identified. The right lung is clear. There is no pleural effusion. Focal sclerosis of proximal left humerus is unchanged. IMPRESSION: Left perihilar mass/consolidation is identified. The right lung is clear. Electronically Signed   By: Abelardo Diesel M.D.   On: 03/06/2018 21:15   Ct Chest W Contrast  Result Date: 02/08/2018 CLINICAL DATA:  Patient with history of metastatic lung cancer. Back pain. Sacral pain. Follow-up exam. EXAM: CT CHEST, ABDOMEN, AND PELVIS WITH CONTRAST TECHNIQUE: Multidetector CT imaging of the chest, abdomen and pelvis was performed following the standard protocol during bolus administration of intravenous contrast. CONTRAST:  170m OMNIPAQUE IOHEXOL 300 MG/ML  SOLN COMPARISON:  CT CAP 11/08/2017 FINDINGS: CT CHEST FINDINGS Cardiovascular: Normal heart size. Trace fluid superior pericardial recess. Thoracic aortic vascular calcifications. Mediastinum/Nodes: Interval increase in size of 1.0 cm left paraesophageal lymph node (image 29; series 2), previously 0.8 cm. Lungs/Pleura: Central airways are patent. New small left pleural effusion. Interval decrease in size of left suprahilar mass measuring approximately 2.6 x 2.0 cm (image 18; series 2), previously 3.2 x 3.7 cm. Masslike density along the fissure within the lingula is slightly increased in size when compared to prior exam measuring approximately 2.3 Cm in thickness (image 63; series 4). Interval development of patchy ground-glass nodularity within the left upper lobe. Interval increase in size of nodule within the lingula measuring 1.6 cm (image 67; series 4), previously 0.9 cm. Interval development of multiple predominately ground-glass nodules throughout the left lower lobe measuring up to 1.7 x 1.2 cm (image 50; series 4). Similar 6 mm left lower lobe nodule (image 78;  series 4). Interval development of peripheral subpleural masslike consolidation right lower lobe measuring 3.4 x 1.4 cm (image 83; series 4) with peripheral ground-glass attenuation. Interval increase in right lower lobe nodule measuring 4 mm (image 72; series 4), previously 2 mm. No pneumothorax. Musculoskeletal: Thoracic spine degenerative changes. Interval increase in size of 1.5 cm sclerotic lesion within the proximal left humerus (image 1; series 4), previously 0.7 cm. CT ABDOMEN PELVIS FINDINGS Hepatobiliary: Interval increase in size of multiple lesions throughout the left and right hepatic lobes. Reference lesion within the hepatic dome measures 2.2 x 2.5 cm (image 38; series 2), previously 0.9 x 0.8 cm. Reference lesion within the left hepatic lobe measures 3.0 x 2.9 cm (image 42; series 2), previously 1.3 x 1.4 cm. Gallbladder is unremarkable. New 2.3 x 2.2 cm lesion adjacent to the gallbladder fossa (image 47; series 2). Pancreas: Unremarkable Spleen: Stable 9 mm low-attenuation lesion in the spleen (image 49; series 2). Adrenals/Urinary Tract: Normal adrenal glands. Kidneys enhance symmetrically with contrast. Left-sided nephrolithiasis with a stone in  the interpolar region left kidney measuring up to 6 mm (image 60; series 2). 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 Urinary bladder is unremarkable. Stomach/Bowel: Large amount of stool throughout the colon. Small hiatal hernia. Normal morphology of the stomach. No evidence for small bowel obstruction. No free fluid or free intraperitoneal air. Vascular/Lymphatic: Normal caliber abdominal aorta. Peripheral calcified atherosclerotic plaque. No retroperitoneal lymphadenopathy. Reproductive: Status post hysterectomy. Other: None. Musculoskeletal: Lumbar spine degenerative changes. No aggressive or acute appearing osseous lesions. IMPRESSION: 1. New large area of peripheral consolidation with surrounding ground-glass opacity  right lower lobe which may be infectious/inflammatory in etiology. Metastatic disease not excluded. Possibility of pulmonary infarct in the setting of pulmonary embolus not entirely excluded. Consider dedicated evaluation with CTA chest as clinically indicated. 2. Interval decrease in size of left suprahilar mass. 3. Interval increase in bandlike consolidative opacity within the lingula adjacent to the fissure. Interval increase in size of adjacent nodularity. 4. Interval development of multiple predominately ground-glass nodules within the left upper and left lower lobes which may be infectious/inflammatory or metastatic in etiology. 5. Interval increase in size and development of new hepatic lesions compatible with metastatic disease. 6. Increasing sclerotic lesion within the proximal left humerus concerning for osseous metastatic disease. 7. These results will be called to the ordering clinician or representative by the Radiologist Assistant, and communication documented in the PACS or zVision Dashboard. Electronically Signed   By: Lovey Newcomer M.D.   On: 02/08/2018 16:59   Ct Angio Chest Pe W And/or Wo Contrast  Result Date: 03/06/2018 CLINICAL DATA:  Dyspnea and chest pain starting 2 days ago. EXAM: CT ANGIOGRAPHY CHEST WITH CONTRAST TECHNIQUE: Multidetector CT imaging of the chest was performed using the standard protocol during bolus administration of intravenous contrast. Multiplanar CT image reconstructions and MIPs were obtained to evaluate the vascular anatomy. CONTRAST:  134m ISOVUE-370 IOPAMIDOL (ISOVUE-370) INJECTION 76% COMPARISON:  Multiple prior CTs dating back through 08/07/2017 FINDINGS: Cardiovascular: Positive pulmonary embolus within the distal right main pulmonary artery extending into the right lower lobe lobar and segmental branches. RV/LV ratio 1.06 consistent with right heart strain. Heart size is normal. No pericardial effusion or thickening. Nonaneurysmal thoracic aorta.  Mediastinum/Nodes: No change and subcentimeter short axis left para soft GIA lymph node now estimated 8 mm versus 9 mm short axis. No mediastinal adenopathy. The esophagus is unremarkable. Trachea is patent. Mainstem bronchi are patent. Slight luminal narrowing from extrinsic slight mass effect of the left upper lobe and lingular bronchi. Lungs/Pleura: Smaller left suprahilar upper lobe masslike opacity, series 4/33 measuring 18 x 17 mm versus 26 x 20 mm previously, series 10/49. Perihilar masslike opacity in the left hilar region predominantly within the left upper lobe in appearance with new bandlike subpleural area of atelectasis and/or scarring in the left upper lobe, series 10/57. Masslike opacity adjacent to the major fissure within the lingula is redemonstrated also stable in appearance measuring approximately 25 x 20 mm and 26 x 19 mm previously upon my re-measurements. New nonspecific spiculated opacities occupying the superior segment of left lower lobe are new since prior. Although findings may be postinfectious or inflammatory in etiology, the possibility of neoplastic disease accounting for the spiculated left lower lobe opacities is not excluded. Right lung remains relatively clear and stable. Upper Abdomen: Subtle hypodense mass in the right hepatic lobe measuring 26 x 25 mm is slightly larger versus 25 x 22 mm previously. Additional lesion in the left hepatic lobe measuring approximately  28 x 26 mm is stable. A third lesion further caudad in the left hepatic lobe measuring 15 mm diameter is stable. A fourth lesion in the right hepatic lobe adjacent to the gallbladder fossa is stable to slightly smaller at 20 x 17 mm versus 23 x 22 mm previously. Musculoskeletal: Thoracic spine degenerative changes without aggressive osseous lesions. Unchanged mixed sclerotic and cystic lesion of the proximal left humerus possibly cartilaginous. Metastatic disease not entirely excluded. Review of the MIP images  confirms the above findings. IMPRESSION: 1. Acute pulmonary emboli within the right main pulmonary artery extending into the right lower lobe with right heart strain, RV/LV ratio 1.06. 2. Redemonstration of ill-defined masslike opacities in the left upper lobe and lingula with new patchy nonspecific slightly spiculated pulmonary opacities occupying the superior segment of left lower lobe. Progression of metastatic disease is of concern now involving superior segment left lower lobe. Otherwise, the suprahilar and lingular opacities are remained relatively stable. 3. Redemonstration of several hypodense hepatic masses most of which have remained stable to slightly smaller in appearance. These results were called by telephone at the time of interpretation on 03/06/2018 at 11:46 pm to Dr. Isla Pence , who verbally acknowledged these results. Aortic Atherosclerosis (ICD10-I70.0). Electronically Signed   By: Ashley Royalty M.D.   On: 03/06/2018 23:47   Ct Abdomen Pelvis W Contrast  Result Date: 02/08/2018 CLINICAL DATA:  Patient with history of metastatic lung cancer. Back pain. Sacral pain. Follow-up exam. EXAM: CT CHEST, ABDOMEN, AND PELVIS WITH CONTRAST TECHNIQUE: Multidetector CT imaging of the chest, abdomen and pelvis was performed following the standard protocol during bolus administration of intravenous contrast. CONTRAST:  128m OMNIPAQUE IOHEXOL 300 MG/ML  SOLN COMPARISON:  CT CAP 11/08/2017 FINDINGS: CT CHEST FINDINGS Cardiovascular: Normal heart size. Trace fluid superior pericardial recess. Thoracic aortic vascular calcifications. Mediastinum/Nodes: Interval increase in size of 1.0 cm left paraesophageal lymph node (image 29; series 2), previously 0.8 cm. Lungs/Pleura: Central airways are patent. New small left pleural effusion. Interval decrease in size of left suprahilar mass measuring approximately 2.6 x 2.0 cm (image 18; series 2), previously 3.2 x 3.7 cm. Masslike density along the fissure within the  lingula is slightly increased in size when compared to prior exam measuring approximately 2.3 Cm in thickness (image 63; series 4). Interval development of patchy ground-glass nodularity within the left upper lobe. Interval increase in size of nodule within the lingula measuring 1.6 cm (image 67; series 4), previously 0.9 cm. Interval development of multiple predominately ground-glass nodules throughout the left lower lobe measuring up to 1.7 x 1.2 cm (image 50; series 4). Similar 6 mm left lower lobe nodule (image 78; series 4). Interval development of peripheral subpleural masslike consolidation right lower lobe measuring 3.4 x 1.4 cm (image 83; series 4) with peripheral ground-glass attenuation. Interval increase in right lower lobe nodule measuring 4 mm (image 72; series 4), previously 2 mm. No pneumothorax. Musculoskeletal: Thoracic spine degenerative changes. Interval increase in size of 1.5 cm sclerotic lesion within the proximal left humerus (image 1; series 4), previously 0.7 cm. CT ABDOMEN PELVIS FINDINGS Hepatobiliary: Interval increase in size of multiple lesions throughout the left and right hepatic lobes. Reference lesion within the hepatic dome measures 2.2 x 2.5 cm (image 38; series 2), previously 0.9 x 0.8 cm. Reference lesion within the left hepatic lobe measures 3.0 x 2.9 cm (image 42; series 2), previously 1.3 x 1.4 cm. Gallbladder is unremarkable. New 2.3 x 2.2 cm lesion adjacent to the gallbladder  fossa (image 47; series 2). Pancreas: Unremarkable Spleen: Stable 9 mm low-attenuation lesion in the spleen (image 49; series 2). Adrenals/Urinary Tract: Normal adrenal glands. Kidneys enhance symmetrically with contrast. Left-sided nephrolithiasis with a stone in the interpolar region left kidney measuring up to 6 mm (image 60; series 2). 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 Urinary bladder is unremarkable. Stomach/Bowel: Large amount of stool throughout the  colon. Small hiatal hernia. Normal morphology of the stomach. No evidence for small bowel obstruction. No free fluid or free intraperitoneal air. Vascular/Lymphatic: Normal caliber abdominal aorta. Peripheral calcified atherosclerotic plaque. No retroperitoneal lymphadenopathy. Reproductive: Status post hysterectomy. Other: None. Musculoskeletal: Lumbar spine degenerative changes. No aggressive or acute appearing osseous lesions. IMPRESSION: 1. New large area of peripheral consolidation with surrounding ground-glass opacity right lower lobe which may be infectious/inflammatory in etiology. Metastatic disease not excluded. Possibility of pulmonary infarct in the setting of pulmonary embolus not entirely excluded. Consider dedicated evaluation with CTA chest as clinically indicated. 2. Interval decrease in size of left suprahilar mass. 3. Interval increase in bandlike consolidative opacity within the lingula adjacent to the fissure. Interval increase in size of adjacent nodularity. 4. Interval development of multiple predominately ground-glass nodules within the left upper and left lower lobes which may be infectious/inflammatory or metastatic in etiology. 5. Interval increase in size and development of new hepatic lesions compatible with metastatic disease. 6. Increasing sclerotic lesion within the proximal left humerus concerning for osseous metastatic disease. 7. These results will be called to the ordering clinician or representative by the Radiologist Assistant, and communication documented in the PACS or zVision Dashboard. Electronically Signed   By: Lovey Newcomer M.D.   On: 02/08/2018 16:59    (Echo, Carotid, EGD, Colonoscopy, ERCP)    Subjective:   Discharge Exam: Vitals:   03/08/18 2138 03/09/18 0623  BP: (!) 151/79 138/73  Pulse: 94 93  Resp: 16 16  Temp: 98.1 F (36.7 C) 98.1 F (36.7 C)  SpO2: 95% 93%   Vitals:   03/08/18 1408 03/08/18 2138 03/09/18 0447 03/09/18 0623  BP: (!) 151/78  (!) 151/79  138/73  Pulse: 91 94  93  Resp: '20 16  16  '$ Temp: 97.7 F (36.5 C) 98.1 F (36.7 C)  98.1 F (36.7 C)  TempSrc: Oral Oral  Oral  SpO2: 98% 95%  93%  Weight:   94.7 kg   Height:        General: Pt is alert, awake, not in acute distress Cardiovascular: RRR, S1/S2 +, no rubs, no gallops Respiratory: CTA bilaterally, no wheezing, no rhonchi Abdominal: Soft, NT, ND, bowel sounds + Extremities: no edema, no cyanosis    The results of significant diagnostics from this hospitalization (including imaging, microbiology, ancillary and laboratory) are listed below for reference.     Microbiology: No results found for this or any previous visit (from the past 240 hour(s)).   Labs: BNP (last 3 results) Recent Labs    03/06/18 2209  BNP 10.2   Basic Metabolic Panel: Recent Labs  Lab 03/06/18 2209 03/07/18 0305  NA 141 144  K 4.0 3.1*  CL 107 110  CO2 24 23  GLUCOSE 98 148*  BUN 19 15  CREATININE 0.76 0.75  CALCIUM 9.4 9.3   Liver Function Tests: No results for input(s): AST, ALT, ALKPHOS, BILITOT, PROT, ALBUMIN in the last 168 hours. No results for input(s): LIPASE, AMYLASE in the last 168 hours. No results for input(s): AMMONIA in the last 168  hours. CBC: Recent Labs  Lab 03/07/18 0305 03/08/18 0125 03/08/18 0755  WBC 8.2 15.5* 12.4*  NEUTROABS 7.2  --   --   HGB 11.7* 10.9* 10.9*  HCT 36.1 33.4* 33.7*  MCV 92.3 93.6 93.6  PLT 82* 107* 113*   Cardiac Enzymes: Recent Labs  Lab 03/08/18 0125 03/08/18 0755 03/08/18 1347  TROPONINI <0.03 <0.03 <0.03   BNP: Invalid input(s): POCBNP CBG: No results for input(s): GLUCAP in the last 168 hours. D-Dimer No results for input(s): DDIMER in the last 72 hours. Hgb A1c No results for input(s): HGBA1C in the last 72 hours. Lipid Profile No results for input(s): CHOL, HDL, LDLCALC, TRIG, CHOLHDL, LDLDIRECT in the last 72 hours. Thyroid function studies No results for input(s): TSH, T4TOTAL, T3FREE,  THYROIDAB in the last 72 hours.  Invalid input(s): FREET3 Anemia work up No results for input(s): VITAMINB12, FOLATE, FERRITIN, TIBC, IRON, RETICCTPCT in the last 72 hours. Urinalysis    Component Value Date/Time   PROTEINUR NEGATIVE 02/21/2018 0749   Sepsis Labs Invalid input(s): PROCALCITONIN,  WBC,  LACTICIDVEN Microbiology No results found for this or any previous visit (from the past 240 hour(s)).   Time coordinating discharge: 45 minutes  SIGNED:   Georgette Shell, MD  [facility] 03/09/2018, 10:03 AM Pager   If 7PM-7AM, please contact night-coverage [email or web address] Password [email or web address]

## 2018-03-09 NOTE — Plan of Care (Signed)
Discharge instructions reviewed with patient and husband, questions answered, verbalized understanding.  Patient transported to main entrance via wheelchair to be taken home by husband.

## 2018-03-14 ENCOUNTER — Telehealth: Payer: Self-pay | Admitting: Internal Medicine

## 2018-03-14 ENCOUNTER — Inpatient Hospital Stay: Payer: Medicare Other

## 2018-03-14 ENCOUNTER — Inpatient Hospital Stay (HOSPITAL_BASED_OUTPATIENT_CLINIC_OR_DEPARTMENT_OTHER): Payer: Medicare Other | Admitting: Internal Medicine

## 2018-03-14 ENCOUNTER — Encounter: Payer: Self-pay | Admitting: Internal Medicine

## 2018-03-14 VITALS — BP 140/53 | HR 90 | Temp 98.6°F | Resp 18 | Ht 66.0 in | Wt 209.7 lb

## 2018-03-14 DIAGNOSIS — M545 Low back pain: Secondary | ICD-10-CM | POA: Diagnosis not present

## 2018-03-14 DIAGNOSIS — Z7689 Persons encountering health services in other specified circumstances: Secondary | ICD-10-CM | POA: Diagnosis not present

## 2018-03-14 DIAGNOSIS — Z5111 Encounter for antineoplastic chemotherapy: Secondary | ICD-10-CM

## 2018-03-14 DIAGNOSIS — Z79899 Other long term (current) drug therapy: Secondary | ICD-10-CM

## 2018-03-14 DIAGNOSIS — C3412 Malignant neoplasm of upper lobe, left bronchus or lung: Secondary | ICD-10-CM | POA: Diagnosis not present

## 2018-03-14 DIAGNOSIS — Z5112 Encounter for antineoplastic immunotherapy: Secondary | ICD-10-CM | POA: Diagnosis not present

## 2018-03-14 DIAGNOSIS — Z7901 Long term (current) use of anticoagulants: Secondary | ICD-10-CM

## 2018-03-14 DIAGNOSIS — Z86718 Personal history of other venous thrombosis and embolism: Secondary | ICD-10-CM | POA: Diagnosis not present

## 2018-03-14 DIAGNOSIS — R030 Elevated blood-pressure reading, without diagnosis of hypertension: Secondary | ICD-10-CM | POA: Diagnosis not present

## 2018-03-14 DIAGNOSIS — R531 Weakness: Secondary | ICD-10-CM | POA: Diagnosis not present

## 2018-03-14 DIAGNOSIS — R5382 Chronic fatigue, unspecified: Secondary | ICD-10-CM

## 2018-03-14 DIAGNOSIS — C778 Secondary and unspecified malignant neoplasm of lymph nodes of multiple regions: Secondary | ICD-10-CM | POA: Diagnosis not present

## 2018-03-14 DIAGNOSIS — C787 Secondary malignant neoplasm of liver and intrahepatic bile duct: Secondary | ICD-10-CM | POA: Diagnosis not present

## 2018-03-14 DIAGNOSIS — C3492 Malignant neoplasm of unspecified part of left bronchus or lung: Secondary | ICD-10-CM

## 2018-03-14 DIAGNOSIS — Z86711 Personal history of pulmonary embolism: Secondary | ICD-10-CM

## 2018-03-14 DIAGNOSIS — I2699 Other pulmonary embolism without acute cor pulmonale: Secondary | ICD-10-CM

## 2018-03-14 DIAGNOSIS — R5383 Other fatigue: Secondary | ICD-10-CM

## 2018-03-14 LAB — CBC WITH DIFFERENTIAL (CANCER CENTER ONLY)
Abs Immature Granulocytes: 0.02 10*3/uL (ref 0.00–0.07)
BASOS PCT: 0 %
Basophils Absolute: 0 10*3/uL (ref 0.0–0.1)
EOS ABS: 0 10*3/uL (ref 0.0–0.5)
EOS PCT: 0 %
HEMATOCRIT: 34.2 % — AB (ref 36.0–46.0)
Hemoglobin: 11.5 g/dL — ABNORMAL LOW (ref 12.0–15.0)
IMMATURE GRANULOCYTES: 0 %
LYMPHS ABS: 1 10*3/uL (ref 0.7–4.0)
Lymphocytes Relative: 15 %
MCH: 30.7 pg (ref 26.0–34.0)
MCHC: 33.6 g/dL (ref 30.0–36.0)
MCV: 91.4 fL (ref 80.0–100.0)
MONOS PCT: 16 %
Monocytes Absolute: 1.1 10*3/uL — ABNORMAL HIGH (ref 0.1–1.0)
NEUTROS PCT: 69 %
NRBC: 0 % (ref 0.0–0.2)
Neutro Abs: 4.7 10*3/uL (ref 1.7–7.7)
PLATELETS: 186 10*3/uL (ref 150–400)
RBC: 3.74 MIL/uL — ABNORMAL LOW (ref 3.87–5.11)
RDW: 14.7 % (ref 11.5–15.5)
WBC Count: 6.8 10*3/uL (ref 4.0–10.5)

## 2018-03-14 LAB — CMP (CANCER CENTER ONLY)
ALT: 33 U/L (ref 0–44)
ANION GAP: 9 (ref 5–15)
AST: 33 U/L (ref 15–41)
Albumin: 3.3 g/dL — ABNORMAL LOW (ref 3.5–5.0)
Alkaline Phosphatase: 195 U/L — ABNORMAL HIGH (ref 38–126)
BILIRUBIN TOTAL: 0.7 mg/dL (ref 0.3–1.2)
BUN: 10 mg/dL (ref 8–23)
CHLORIDE: 106 mmol/L (ref 98–111)
CO2: 23 mmol/L (ref 22–32)
Calcium: 9 mg/dL (ref 8.9–10.3)
Creatinine: 0.65 mg/dL (ref 0.44–1.00)
Glucose, Bld: 84 mg/dL (ref 70–99)
POTASSIUM: 3.8 mmol/L (ref 3.5–5.1)
Sodium: 138 mmol/L (ref 135–145)
TOTAL PROTEIN: 7.4 g/dL (ref 6.5–8.1)

## 2018-03-14 LAB — TOTAL PROTEIN, URINE DIPSTICK: Protein, ur: NEGATIVE mg/dL

## 2018-03-14 LAB — TSH: TSH: 0.333 u[IU]/mL (ref 0.308–3.960)

## 2018-03-14 NOTE — Telephone Encounter (Signed)
Scheduled appt per 10/17 los - gave patient AVS and calender per los.   

## 2018-03-14 NOTE — Progress Notes (Signed)
[employer] [phone]   [phone]  OFFICE PROGRESS NOTE  System, Pcp Not In No address on file  DIAGNOSIS: Stage IV (T1b, N3, M1b) non-small cell lung cancer, adenocarcinoma with positive EGFR mutation in exon 21 (L858R) and recently found to have T790M resistant mutation, presented with left upper lobe lung mass in addition to left hilar and bilateral mediastinal lymphadenopathy as well as liver metastasis diagnosed in May of 2015.  PRIOR THERAPY: 1) Tarceva 150 mg by mouth daily. Started 11/25/2013. Status post 5 months of treatment discontinued today secondary to intolerance with persistent paronychia. 2) Tarceva 100 mg by mouth daily. Started 06/03/2014. Status post 8 months of treatment.  3) stereotactic radiotherapy to liver lesions under the care of Dr. Tammi Klippel. Last dose 03/02/2017 4) Tagrisso 80 mg by mouth daily status post 34 months of treatment. First dose was given 03/04/2015.  This was discontinued secondary to disease progression.   CURRENT THERAPY: Systemic chemotherapy with carboplatin for AUC of 6, paclitaxel 200 mg/M2, Avastin 15 mg/KG and Tecentriq 1200 mg IV every 3 weeks with Neulasta support.  First dose 02/21/2018.  Status post 1 cycle.  INTERVAL HISTORY: Debra Hodges 67 y.o. female returns to the clinic today for follow-up visit.  The patient is complaining of increasing fatigue and weakness as well as shortness of breath at baseline increased with exertion.  She was recently diagnosed with deep venous thrombosis as well as pulmonary embolism.  She is currently on Eliquis and she is feeling a little bit better.  She denied having any chest pain, cough or hemoptysis.  She denied having any nausea, vomiting, diarrhea or constipation.  She tolerated the first cycle of her systemic chemotherapy fairly well except for aching pain and fatigue after the Neulasta injection.  The patient is here today for evaluation before starting  cycle #2 of her treatment.   MEDICAL HISTORY: Past Medical History:  Diagnosis Date  . High blood pressure   . Liver lesion 06/26/2016  . Non-small cell carcinoma of lung, stage 4 ([place]) dx'd 10/2013    ALLERGIES:  is allergic to peanut-containing drug products and penicillins.  MEDICATIONS:  Current Outpatient Medications  Medication Sig Dispense Refill  . acetaminophen (TYLENOL) 325 MG tablet Take 650 mg by mouth every 6 (six) hours as needed. Reported on 06/15/2015    . apixaban (ELIQUIS) 5 MG TABS tablet Take 2 tablets (10 mg total) by mouth 2 (two) times daily. 24 tablet 0  . calcium carbonate (OS-CAL) 600 MG TABS tablet Take 600 mg by mouth daily.     Marland Kitchen levothyroxine (SYNTHROID, LEVOTHROID) 50 MCG tablet Take 50 mcg by mouth daily.    . Multiple Vitamins-Calcium (ONE-A-DAY WOMENS PO) Take 1 tablet by mouth daily.    Marland Kitchen oxyCODONE-acetaminophen (PERCOCET/ROXICET) 5-325 MG tablet Take 1 tablet by mouth every 6 (six) hours as needed for severe pain. 30 tablet 0  . potassium chloride SA (K-DUR,KLOR-CON) 20 MEQ tablet 1 tablet daily.     . prochlorperazine (COMPAZINE) 10 MG tablet TAKE 1 TABLET(10 MG) BY MOUTH EVERY 6 HOURS AS NEEDED FOR NAUSEA OR VOMITING 385 tablet 3  . Sodium Fluoride (CLINPRO 5000) 1.1 % PSTE Place 1 application onto teeth 3 (three) times daily.    Marland Kitchen spironolactone (ALDACTONE) 25 MG tablet 1 tablet daily.    . vitamin C (ASCORBIC ACID) 500 MG tablet Take 500 mg by mouth daily.    Derrill Memo ON 03/15/2018] apixaban (ELIQUIS) 5 MG TABS  tablet Take 1 tablet (5 mg total) by mouth 2 (two) times daily. (Patient not taking: Reported on 03/14/2018) 60 tablet 0  . CLIMARA 0.1 MG/24HR patch 1 patch once a week.    . loperamide (IMODIUM) 2 MG capsule Take by mouth as needed for diarrhea or loose stools. Reported on 11/22/2015    . loratadine (CLARITIN) 10 MG tablet Take 10 mg by mouth daily.     No current facility-administered medications for this visit.     SURGICAL HISTORY:    Past Surgical History:  Procedure Laterality Date  . ABDOMINAL HYSTERECTOMY    . BREAST BIOPSY     Right breast  . VIDEO BRONCHOSCOPY Bilateral 10/27/2013   Procedure: VIDEO BRONCHOSCOPY WITH FLUORO;  Surgeon: Collene Gobble, MD;  Location: [facility];  Service: Cardiopulmonary;  Laterality: Bilateral;    REVIEW OF SYSTEMS:  Constitutional: positive for fatigue Eyes: negative Ears, nose, mouth, throat, and face: negative Respiratory: positive for cough and dyspnea on exertion Cardiovascular: negative Gastrointestinal: negative Genitourinary:negative Integument/breast: negative Hematologic/lymphatic: negative Musculoskeletal:negative Neurological: negative Behavioral/Psych: negative Endocrine: negative Allergic/Immunologic: negative   PHYSICAL EXAMINATION: General appearance: alert, cooperative, fatigued and no distress Head: Normocephalic, without obvious abnormality, atraumatic Neck: no adenopathy, no JVD, supple, symmetrical, trachea midline and thyroid not enlarged, symmetric, no tenderness/mass/nodules Lymph nodes: Cervical, supraclavicular, and axillary nodes normal. Resp: clear to auscultation bilaterally Back: symmetric, no curvature. ROM normal. No CVA tenderness. Cardio: regular rate and rhythm, S1, S2 normal, no murmur, click, rub or gallop GI: soft, non-tender; bowel sounds normal; no masses,  no organomegaly Extremities: extremities normal, atraumatic, no cyanosis or edema Neurologic: Alert and oriented X 3, normal strength and tone. Normal symmetric reflexes. Normal coordination and gait  ECOG PERFORMANCE STATUS: 1 - Symptomatic but completely ambulatory  Blood pressure (!) 140/53, pulse 90, temperature 98.6 F (37 C), temperature source Oral, resp. rate 18, height '5\' 6"'$  (1.676 m), weight 209 lb 11.2 oz (95.1 kg), SpO2 99 %.  LABORATORY DATA: Lab Results  Component Value Date   WBC 6.8 03/14/2018   HGB 11.5 (L) 03/14/2018   HCT 34.2 (L) 03/14/2018   MCV  91.4 03/14/2018   PLT 186 03/14/2018      Chemistry      Component Value Date/Time   NA 138 03/14/2018 1022   NA 141 04/30/2017 1541   K 3.8 03/14/2018 1022   K 4.1 04/30/2017 1541   CL 106 03/14/2018 1022   CO2 23 03/14/2018 1022   CO2 26 04/30/2017 1541   BUN 10 03/14/2018 1022   BUN 14.1 04/30/2017 1541   CREATININE 0.65 03/14/2018 1022   CREATININE 0.8 04/30/2017 1541      Component Value Date/Time   CALCIUM 9.0 03/14/2018 1022   CALCIUM 9.6 04/30/2017 1541   ALKPHOS 195 (H) 03/14/2018 1022   ALKPHOS 105 04/30/2017 1541   AST 33 03/14/2018 1022   AST 40 (H) 04/30/2017 1541   ALT 33 03/14/2018 1022   ALT 27 04/30/2017 1541   BILITOT 0.7 03/14/2018 1022   BILITOT 0.55 04/30/2017 1541       RADIOGRAPHIC STUDIES: Dg Chest 2 View  Result Date: 03/06/2018 CLINICAL DATA:  Shortness of breath EXAM: CHEST - 2 VIEW COMPARISON:  Chest CT February 08, 2018 FINDINGS: The heart size and mediastinal contours are within normal limits. Left perihilar mass/consolidation is identified. The right lung is clear. There is no pleural effusion. Focal sclerosis of proximal left humerus is unchanged. IMPRESSION: Left perihilar mass/consolidation is identified. The right  lung is clear. Electronically Signed   By: Abelardo Diesel M.D.   On: 03/06/2018 21:15   Ct Angio Chest Pe W And/or Wo Contrast  Result Date: 03/06/2018 CLINICAL DATA:  Dyspnea and chest pain starting 2 days ago. EXAM: CT ANGIOGRAPHY CHEST WITH CONTRAST TECHNIQUE: Multidetector CT imaging of the chest was performed using the standard protocol during bolus administration of intravenous contrast. Multiplanar CT image reconstructions and MIPs were obtained to evaluate the vascular anatomy. CONTRAST:  145m ISOVUE-370 IOPAMIDOL (ISOVUE-370) INJECTION 76% COMPARISON:  Multiple prior CTs dating back through 08/07/2017 FINDINGS: Cardiovascular: Positive pulmonary embolus within the distal right main pulmonary artery extending into the  right lower lobe lobar and segmental branches. RV/LV ratio 1.06 consistent with right heart strain. Heart size is normal. No pericardial effusion or thickening. Nonaneurysmal thoracic aorta. Mediastinum/Nodes: No change and subcentimeter short axis left para soft GIA lymph node now estimated 8 mm versus 9 mm short axis. No mediastinal adenopathy. The esophagus is unremarkable. Trachea is patent. Mainstem bronchi are patent. Slight luminal narrowing from extrinsic slight mass effect of the left upper lobe and lingular bronchi. Lungs/Pleura: Smaller left suprahilar upper lobe masslike opacity, series 4/33 measuring 18 x 17 mm versus 26 x 20 mm previously, series 10/49. Perihilar masslike opacity in the left hilar region predominantly within the left upper lobe in appearance with new bandlike subpleural area of atelectasis and/or scarring in the left upper lobe, series 10/57. Masslike opacity adjacent to the major fissure within the lingula is redemonstrated also stable in appearance measuring approximately 25 x 20 mm and 26 x 19 mm previously upon my re-measurements. New nonspecific spiculated opacities occupying the superior segment of left lower lobe are new since prior. Although findings may be postinfectious or inflammatory in etiology, the possibility of neoplastic disease accounting for the spiculated left lower lobe opacities is not excluded. Right lung remains relatively clear and stable. Upper Abdomen: Subtle hypodense mass in the right hepatic lobe measuring 26 x 25 mm is slightly larger versus 25 x 22 mm previously. Additional lesion in the left hepatic lobe measuring approximately 28 x 26 mm is stable. A third lesion further caudad in the left hepatic lobe measuring 15 mm diameter is stable. A fourth lesion in the right hepatic lobe adjacent to the gallbladder fossa is stable to slightly smaller at 20 x 17 mm versus 23 x 22 mm previously. Musculoskeletal: Thoracic spine degenerative changes without  aggressive osseous lesions. Unchanged mixed sclerotic and cystic lesion of the proximal left humerus possibly cartilaginous. Metastatic disease not entirely excluded. Review of the MIP images confirms the above findings. IMPRESSION: 1. Acute pulmonary emboli within the right main pulmonary artery extending into the right lower lobe with right heart strain, RV/LV ratio 1.06. 2. Redemonstration of ill-defined masslike opacities in the left upper lobe and lingula with new patchy nonspecific slightly spiculated pulmonary opacities occupying the superior segment of left lower lobe. Progression of metastatic disease is of concern now involving superior segment left lower lobe. Otherwise, the suprahilar and lingular opacities are remained relatively stable. 3. Redemonstration of several hypodense hepatic masses most of which have remained stable to slightly smaller in appearance. These results were called by telephone at the time of interpretation on 03/06/2018 at 11:46 pm to Dr. JIsla Pence, who verbally acknowledged these results. Aortic Atherosclerosis (ICD10-I70.0). Electronically Signed   By: DAshley RoyaltyM.D.   On: 03/06/2018 23:47    ASSESSMENT AND PLAN:  This is a very pleasant 67years old  white female with a stage IV non-small cell lung cancer, adenocarcinoma with positive EGFR mutation in exon 21 (L858R) diagnosed in May 2015 status post treatment with Tarceva for 13 months discontinued secondary to disease progression and development of T790M resistant mutation. The patient is currently undergoing treatment with Tagrisso 80 mg by mouth daily status post 34 months. I also referred the patient to Dr. Tammi Klippel from radiation oncology for consideration of palliative radiotherapy to the enlarging left upper lobe suprahilar mass to avoid further obstruction of her bronchial tree. Repeat CT scan of the chest, abdomen and pelvis showed findings for disease progression especially in the liver. The patient  discontinued her treatment with Tagrisso and she was started on systemic chemotherapy with carboplatin for AUC of 6, paclitaxel 200 mg/M2, Avastin 15 mg/KG as well as Tecentriq (Atezolizumab) 1200 mg IV every 3 weeks status post 1 cycle.  She tolerated the chemotherapy fairly well except for the aching pain and fatigue after the Neulasta injection. The patient was recently diagnosed with the venous thrombosis and pulmonary embolus.  She is currently on treatment with Eliquis. She was supposed to start cycle #2 of her treatment today but the patient is complaining of increasing fatigue and weakness and she was just recently discharged from the hospital. I recommended for the patient to delay the start of cycle #2 by at least 1 week to give her more time for recovery. I will see her back for follow-up visit in 4 weeks for evaluation with the start of cycle #3. She was advised to call immediately if she has any concerning symptoms in the interval. For the low back pain this is likely secondary to degenerative disc disease seen on the recently scan, I will start the patient on Percocet 5/325 mg p.o. every 6 hours as needed. The patient voices understanding of current disease status and treatment options and is in agreement with the current care plan. All questions were answered. The patient knows to call the clinic with any problems, questions or concerns. We can certainly see the patient much sooner if necessary.  Disclaimer: This note was dictated with voice recognition software. Similar sounding words can inadvertently be transcribed and may not be corrected upon review.

## 2018-03-16 ENCOUNTER — Inpatient Hospital Stay: Payer: Medicare Other

## 2018-03-21 ENCOUNTER — Inpatient Hospital Stay: Payer: Medicare Other

## 2018-03-21 VITALS — BP 120/61 | HR 81 | Temp 97.9°F | Resp 16

## 2018-03-21 DIAGNOSIS — C3492 Malignant neoplasm of unspecified part of left bronchus or lung: Secondary | ICD-10-CM

## 2018-03-21 DIAGNOSIS — Z7689 Persons encountering health services in other specified circumstances: Secondary | ICD-10-CM | POA: Diagnosis not present

## 2018-03-21 DIAGNOSIS — C778 Secondary and unspecified malignant neoplasm of lymph nodes of multiple regions: Secondary | ICD-10-CM | POA: Diagnosis not present

## 2018-03-21 DIAGNOSIS — C787 Secondary malignant neoplasm of liver and intrahepatic bile duct: Secondary | ICD-10-CM | POA: Diagnosis not present

## 2018-03-21 DIAGNOSIS — Z5112 Encounter for antineoplastic immunotherapy: Secondary | ICD-10-CM | POA: Diagnosis not present

## 2018-03-21 DIAGNOSIS — C3412 Malignant neoplasm of upper lobe, left bronchus or lung: Secondary | ICD-10-CM | POA: Diagnosis not present

## 2018-03-21 DIAGNOSIS — Z5111 Encounter for antineoplastic chemotherapy: Secondary | ICD-10-CM | POA: Diagnosis not present

## 2018-03-21 LAB — CBC WITH DIFFERENTIAL (CANCER CENTER ONLY)
ABS IMMATURE GRANULOCYTES: 0.02 10*3/uL (ref 0.00–0.07)
BASOS PCT: 0 %
Basophils Absolute: 0 10*3/uL (ref 0.0–0.1)
EOS ABS: 0.1 10*3/uL (ref 0.0–0.5)
EOS PCT: 2 %
HCT: 33.7 % — ABNORMAL LOW (ref 36.0–46.0)
Hemoglobin: 11.3 g/dL — ABNORMAL LOW (ref 12.0–15.0)
Immature Granulocytes: 0 %
Lymphocytes Relative: 16 %
Lymphs Abs: 0.9 10*3/uL (ref 0.7–4.0)
MCH: 30.9 pg (ref 26.0–34.0)
MCHC: 33.5 g/dL (ref 30.0–36.0)
MCV: 92.1 fL (ref 80.0–100.0)
MONO ABS: 0.9 10*3/uL (ref 0.1–1.0)
MONOS PCT: 16 %
NEUTROS ABS: 3.8 10*3/uL (ref 1.7–7.7)
Neutrophils Relative %: 66 %
PLATELETS: 212 10*3/uL (ref 150–400)
RBC: 3.66 MIL/uL — AB (ref 3.87–5.11)
RDW: 14.7 % (ref 11.5–15.5)
WBC: 5.8 10*3/uL (ref 4.0–10.5)
nRBC: 0 % (ref 0.0–0.2)

## 2018-03-21 LAB — CMP (CANCER CENTER ONLY)
ALBUMIN: 3.2 g/dL — AB (ref 3.5–5.0)
ALT: 25 U/L (ref 0–44)
AST: 32 U/L (ref 15–41)
Alkaline Phosphatase: 172 U/L — ABNORMAL HIGH (ref 38–126)
Anion gap: 12 (ref 5–15)
BILIRUBIN TOTAL: 0.6 mg/dL (ref 0.3–1.2)
BUN: 14 mg/dL (ref 8–23)
CALCIUM: 9.2 mg/dL (ref 8.9–10.3)
CO2: 22 mmol/L (ref 22–32)
CREATININE: 0.68 mg/dL (ref 0.44–1.00)
Chloride: 106 mmol/L (ref 98–111)
GFR, Est AFR Am: >60 mL/min (ref >60)
GLUCOSE: 95 mg/dL (ref 70–99)
Potassium: 3.7 mmol/L (ref 3.5–5.1)
Sodium: 140 mmol/L (ref 135–145)
TOTAL PROTEIN: 7.5 g/dL (ref 6.5–8.1)

## 2018-03-21 MED ORDER — PALONOSETRON HCL INJECTION 0.25 MG/5ML
INTRAVENOUS | Status: AC
  Filled 2018-03-21: qty 5

## 2018-03-21 MED ORDER — FAMOTIDINE IN NACL 20-0.9 MG/50ML-% IV SOLN
INTRAVENOUS | Status: AC
  Filled 2018-03-21: qty 50

## 2018-03-21 MED ORDER — DIPHENHYDRAMINE HCL 50 MG/ML IJ SOLN
INTRAMUSCULAR | Status: AC
  Filled 2018-03-21: qty 1

## 2018-03-21 MED ORDER — SODIUM CHLORIDE 0.9 % IV SOLN
1200.0000 mg | Freq: Once | INTRAVENOUS | Status: AC
  Administered 2018-03-21: 1200 mg via INTRAVENOUS
  Filled 2018-03-21: qty 20

## 2018-03-21 MED ORDER — DIPHENHYDRAMINE HCL 50 MG/ML IJ SOLN
50.0000 mg | Freq: Once | INTRAMUSCULAR | Status: AC
  Administered 2018-03-21: 50 mg via INTRAVENOUS

## 2018-03-21 MED ORDER — SODIUM CHLORIDE 0.9 % IV SOLN
15.3000 mg/kg | INTRAVENOUS | Status: DC
  Administered 2018-03-21: 1500 mg via INTRAVENOUS
  Filled 2018-03-21: qty 48

## 2018-03-21 MED ORDER — SODIUM CHLORIDE 0.9 % IV SOLN
200.0000 mg/m2 | Freq: Once | INTRAVENOUS | Status: AC
  Administered 2018-03-21: 426 mg via INTRAVENOUS
  Filled 2018-03-21: qty 71

## 2018-03-21 MED ORDER — SODIUM CHLORIDE 0.9 % IV SOLN
Freq: Once | INTRAVENOUS | Status: AC
  Administered 2018-03-21: 10:00:00 via INTRAVENOUS
  Filled 2018-03-21: qty 5

## 2018-03-21 MED ORDER — SODIUM CHLORIDE 0.9 % IV SOLN
Freq: Once | INTRAVENOUS | Status: AC
  Administered 2018-03-21: 09:00:00 via INTRAVENOUS
  Filled 2018-03-21: qty 250

## 2018-03-21 MED ORDER — SODIUM CHLORIDE 0.9 % IV SOLN
655.2000 mg | Freq: Once | INTRAVENOUS | Status: AC
  Administered 2018-03-21: 660 mg via INTRAVENOUS
  Filled 2018-03-21: qty 66

## 2018-03-21 MED ORDER — PALONOSETRON HCL INJECTION 0.25 MG/5ML
0.2500 mg | Freq: Once | INTRAVENOUS | Status: AC
  Administered 2018-03-21: 0.25 mg via INTRAVENOUS

## 2018-03-21 MED ORDER — FAMOTIDINE IN NACL 20-0.9 MG/50ML-% IV SOLN
20.0000 mg | Freq: Once | INTRAVENOUS | Status: AC
  Administered 2018-03-21: 20 mg via INTRAVENOUS

## 2018-03-21 NOTE — Patient Instructions (Signed)
[facility] Discharge Instructions for Patients Receiving Chemotherapy  Today you received the following chemotherapy agents: Avastin, Tecentriq, Paclitaxel, and Carboplatin  To help prevent nausea and vomiting after your treatment, we encourage you to take your nausea medication as prescribed.   If you develop nausea and vomiting that is not controlled by your nausea medication, call the clinic.   BELOW ARE SYMPTOMS THAT SHOULD BE REPORTED IMMEDIATELY:  *FEVER GREATER THAN 100.5 F  *CHILLS WITH OR WITHOUT FEVER  NAUSEA AND VOMITING THAT IS NOT CONTROLLED WITH YOUR NAUSEA MEDICATION  *UNUSUAL SHORTNESS OF BREATH  *UNUSUAL BRUISING OR BLEEDING  TENDERNESS IN MOUTH AND THROAT WITH OR WITHOUT PRESENCE OF ULCERS  *URINARY PROBLEMS  *BOWEL PROBLEMS  UNUSUAL RASH Items with * indicate a potential emergency and should be followed up as soon as possible.  Feel free to call the clinic should you have any questions or concerns. The clinic phone number is [phone].  Please show the [facility] at check-in to the Emergency Department and triage nurse.

## 2018-03-23 ENCOUNTER — Inpatient Hospital Stay: Payer: Medicare Other

## 2018-03-23 VITALS — BP 124/72 | HR 78 | Temp 97.7°F | Resp 18

## 2018-03-23 DIAGNOSIS — Z5112 Encounter for antineoplastic immunotherapy: Secondary | ICD-10-CM | POA: Diagnosis not present

## 2018-03-23 DIAGNOSIS — C778 Secondary and unspecified malignant neoplasm of lymph nodes of multiple regions: Secondary | ICD-10-CM | POA: Diagnosis not present

## 2018-03-23 DIAGNOSIS — Z5111 Encounter for antineoplastic chemotherapy: Secondary | ICD-10-CM | POA: Diagnosis not present

## 2018-03-23 DIAGNOSIS — C3412 Malignant neoplasm of upper lobe, left bronchus or lung: Secondary | ICD-10-CM | POA: Diagnosis not present

## 2018-03-23 DIAGNOSIS — C787 Secondary malignant neoplasm of liver and intrahepatic bile duct: Secondary | ICD-10-CM | POA: Diagnosis not present

## 2018-03-23 DIAGNOSIS — Z7689 Persons encountering health services in other specified circumstances: Secondary | ICD-10-CM | POA: Diagnosis not present

## 2018-03-23 MED ORDER — PEGFILGRASTIM INJECTION 6 MG/0.6ML ~~LOC~~
PREFILLED_SYRINGE | SUBCUTANEOUS | Status: AC
  Filled 2018-03-23: qty 0.6

## 2018-03-23 MED ORDER — PEGFILGRASTIM INJECTION 6 MG/0.6ML ~~LOC~~
6.0000 mg | PREFILLED_SYRINGE | Freq: Once | SUBCUTANEOUS | Status: AC
  Administered 2018-03-23: 6 mg via SUBCUTANEOUS

## 2018-03-23 NOTE — Patient Instructions (Signed)
Pegfilgrastim injection What is this medicine? PEGFILGRASTIM (PEG fil gra stim) is a long-acting granulocyte colony-stimulating factor that stimulates the growth of neutrophils, a type of white blood cell important in the body's fight against infection. It is used to reduce the incidence of fever and infection in patients with certain types of cancer who are receiving chemotherapy that affects the bone marrow, and to increase survival after being exposed to high doses of radiation. This medicine may be used for other purposes; ask your health care provider or pharmacist if you have questions. COMMON BRAND NAME(S): Neulasta What should I tell my health care provider before I take this medicine? They need to know if you have any of these conditions: -kidney disease -latex allergy -ongoing radiation therapy -sickle cell disease -skin reactions to acrylic adhesives (On-Body Injector only) -an unusual or allergic reaction to pegfilgrastim, filgrastim, other medicines, foods, dyes, or preservatives -pregnant or trying to get pregnant -breast-feeding How should I use this medicine? This medicine is for injection under the skin. If you get this medicine at home, you will be taught how to prepare and give the pre-filled syringe or how to use the On-body Injector. Refer to the patient Instructions for Use for detailed instructions. Use exactly as directed. Tell your healthcare provider immediately if you suspect that the On-body Injector may not have performed as intended or if you suspect the use of the On-body Injector resulted in a missed or partial dose. It is important that you put your used needles and syringes in a special sharps container. Do not put them in a trash can. If you do not have a sharps container, call your pharmacist or healthcare provider to get one. Talk to your pediatrician regarding the use of this medicine in children. While this drug may be prescribed for selected conditions,  precautions do apply. Overdosage: If you think you have taken too much of this medicine contact a poison control center or emergency room at once. NOTE: This medicine is only for you. Do not share this medicine with others. What if I miss a dose? It is important not to miss your dose. Call your doctor or health care professional if you miss your dose. If you miss a dose due to an On-body Injector failure or leakage, a new dose should be administered as soon as possible using a single prefilled syringe for manual use. What may interact with this medicine? Interactions have not been studied. Give your health care provider a list of all the medicines, herbs, non-prescription drugs, or dietary supplements you use. Also tell them if you smoke, drink alcohol, or use illegal drugs. Some items may interact with your medicine. This list may not describe all possible interactions. Give your health care provider a list of all the medicines, herbs, non-prescription drugs, or dietary supplements you use. Also tell them if you smoke, drink alcohol, or use illegal drugs. Some items may interact with your medicine. What should I watch for while using this medicine? You may need blood work done while you are taking this medicine. If you are going to need a MRI, CT scan, or other procedure, tell your doctor that you are using this medicine (On-Body Injector only). What side effects may I notice from receiving this medicine? Side effects that you should report to your doctor or health care professional as soon as possible: -allergic reactions like skin rash, itching or hives, swelling of the face, lips, or tongue -dizziness -fever -pain, redness, or irritation at site   where injected -pinpoint red spots on the skin -red or dark-brown urine -shortness of breath or breathing problems -stomach or side pain, or pain at the shoulder -swelling -tiredness -trouble passing urine or change in the amount of urine Side  effects that usually do not require medical attention (report to your doctor or health care professional if they continue or are bothersome): -bone pain -muscle pain This list may not describe all possible side effects. Call your doctor for medical advice about side effects. You may report side effects to FDA at 1-800-FDA-1088. Where should I keep my medicine? Keep out of the reach of children. Store pre-filled syringes in a refrigerator between 2 and 8 degrees C (36 and 46 degrees F). Do not freeze. Keep in carton to protect from light. Throw away this medicine if it is left out of the refrigerator for more than 48 hours. Throw away any unused medicine after the expiration date. NOTE: This sheet is a summary. It may not cover all possible information. If you have questions about this medicine, talk to your doctor, pharmacist, or health care provider.  2018 Elsevier/Gold Standard (2016-05-11 12:58:03)  

## 2018-03-28 ENCOUNTER — Inpatient Hospital Stay: Payer: Medicare Other

## 2018-03-28 ENCOUNTER — Other Ambulatory Visit: Payer: Medicare Other

## 2018-03-28 ENCOUNTER — Telehealth: Payer: Self-pay | Admitting: Medical Oncology

## 2018-03-28 DIAGNOSIS — C787 Secondary malignant neoplasm of liver and intrahepatic bile duct: Secondary | ICD-10-CM | POA: Diagnosis not present

## 2018-03-28 DIAGNOSIS — C778 Secondary and unspecified malignant neoplasm of lymph nodes of multiple regions: Secondary | ICD-10-CM | POA: Diagnosis not present

## 2018-03-28 DIAGNOSIS — Z5111 Encounter for antineoplastic chemotherapy: Secondary | ICD-10-CM | POA: Diagnosis not present

## 2018-03-28 DIAGNOSIS — Z5112 Encounter for antineoplastic immunotherapy: Secondary | ICD-10-CM | POA: Diagnosis not present

## 2018-03-28 DIAGNOSIS — Z7689 Persons encountering health services in other specified circumstances: Secondary | ICD-10-CM | POA: Diagnosis not present

## 2018-03-28 DIAGNOSIS — C3492 Malignant neoplasm of unspecified part of left bronchus or lung: Secondary | ICD-10-CM

## 2018-03-28 DIAGNOSIS — C3412 Malignant neoplasm of upper lobe, left bronchus or lung: Secondary | ICD-10-CM | POA: Diagnosis not present

## 2018-03-28 LAB — CMP (CANCER CENTER ONLY)
ALK PHOS: 172 U/L — AB (ref 38–126)
ALT: 36 U/L (ref 0–44)
AST: 35 U/L (ref 15–41)
Albumin: 3.2 g/dL — ABNORMAL LOW (ref 3.5–5.0)
Anion gap: 10 (ref 5–15)
BILIRUBIN TOTAL: 1 mg/dL (ref 0.3–1.2)
BUN: 12 mg/dL (ref 8–23)
CALCIUM: 9.1 mg/dL (ref 8.9–10.3)
CO2: 28 mmol/L (ref 22–32)
CREATININE: 0.68 mg/dL (ref 0.44–1.00)
Chloride: 102 mmol/L (ref 98–111)
GFR, Estimated: >60 mL/min (ref >60)
Glucose, Bld: 107 mg/dL — ABNORMAL HIGH (ref 70–99)
Potassium: 3.2 mmol/L — ABNORMAL LOW (ref 3.5–5.1)
Sodium: 140 mmol/L (ref 135–145)
Total Protein: 7.2 g/dL (ref 6.5–8.1)

## 2018-03-28 LAB — CBC WITH DIFFERENTIAL (CANCER CENTER ONLY)
Abs Immature Granulocytes: 0 10*3/uL (ref 0.00–0.07)
BASOS ABS: 0 10*3/uL (ref 0.0–0.1)
Basophils Relative: 1 %
EOS ABS: 0 10*3/uL (ref 0.0–0.5)
EOS PCT: 4 %
HEMATOCRIT: 31.5 % — AB (ref 36.0–46.0)
HEMOGLOBIN: 10.8 g/dL — AB (ref 12.0–15.0)
Immature Granulocytes: 0 %
Lymphocytes Relative: 51 %
Lymphs Abs: 0.5 10*3/uL — ABNORMAL LOW (ref 0.7–4.0)
MCH: 31 pg (ref 26.0–34.0)
MCHC: 34.3 g/dL (ref 30.0–36.0)
MCV: 90.5 fL (ref 80.0–100.0)
Monocytes Absolute: 0.4 10*3/uL (ref 0.1–1.0)
Monocytes Relative: 38 %
NRBC: 0 % (ref 0.0–0.2)
Neutro Abs: 0.1 10*3/uL — CL (ref 1.7–7.7)
Neutrophils Relative %: 6 %
Platelet Count: 50 10*3/uL — ABNORMAL LOW (ref 150–400)
RBC: 3.48 MIL/uL — AB (ref 3.87–5.11)
RDW: 14.3 % (ref 11.5–15.5)
WBC: 1 10*3/uL — AB (ref 4.0–10.5)

## 2018-03-28 NOTE — Telephone Encounter (Signed)
LVM-Reviewed Neutropenic precautions and when to call.

## 2018-04-04 ENCOUNTER — Inpatient Hospital Stay: Payer: Medicare Other | Attending: Internal Medicine

## 2018-04-04 ENCOUNTER — Ambulatory Visit: Payer: Medicare Other | Admitting: Internal Medicine

## 2018-04-04 ENCOUNTER — Other Ambulatory Visit: Payer: Medicare Other

## 2018-04-04 ENCOUNTER — Ambulatory Visit: Payer: Medicare Other

## 2018-04-04 DIAGNOSIS — Z7901 Long term (current) use of anticoagulants: Secondary | ICD-10-CM | POA: Insufficient documentation

## 2018-04-04 DIAGNOSIS — M545 Low back pain: Secondary | ICD-10-CM | POA: Diagnosis not present

## 2018-04-04 DIAGNOSIS — R634 Abnormal weight loss: Secondary | ICD-10-CM | POA: Insufficient documentation

## 2018-04-04 DIAGNOSIS — I10 Essential (primary) hypertension: Secondary | ICD-10-CM | POA: Diagnosis not present

## 2018-04-04 DIAGNOSIS — Z5111 Encounter for antineoplastic chemotherapy: Secondary | ICD-10-CM | POA: Insufficient documentation

## 2018-04-04 DIAGNOSIS — C778 Secondary and unspecified malignant neoplasm of lymph nodes of multiple regions: Secondary | ICD-10-CM | POA: Insufficient documentation

## 2018-04-04 DIAGNOSIS — Z79899 Other long term (current) drug therapy: Secondary | ICD-10-CM | POA: Insufficient documentation

## 2018-04-04 DIAGNOSIS — C787 Secondary malignant neoplasm of liver and intrahepatic bile duct: Secondary | ICD-10-CM | POA: Insufficient documentation

## 2018-04-04 DIAGNOSIS — R5383 Other fatigue: Secondary | ICD-10-CM | POA: Insufficient documentation

## 2018-04-04 DIAGNOSIS — Z7689 Persons encountering health services in other specified circumstances: Secondary | ICD-10-CM | POA: Diagnosis not present

## 2018-04-04 DIAGNOSIS — C3412 Malignant neoplasm of upper lobe, left bronchus or lung: Secondary | ICD-10-CM | POA: Insufficient documentation

## 2018-04-04 DIAGNOSIS — R5382 Chronic fatigue, unspecified: Secondary | ICD-10-CM

## 2018-04-04 DIAGNOSIS — C3492 Malignant neoplasm of unspecified part of left bronchus or lung: Secondary | ICD-10-CM

## 2018-04-04 LAB — CMP (CANCER CENTER ONLY)
ALBUMIN: 3.2 g/dL — AB (ref 3.5–5.0)
ALT: 24 U/L (ref 0–44)
AST: 31 U/L (ref 15–41)
Alkaline Phosphatase: 179 U/L — ABNORMAL HIGH (ref 38–126)
Anion gap: 9 (ref 5–15)
BUN: 9 mg/dL (ref 8–23)
CHLORIDE: 106 mmol/L (ref 98–111)
CO2: 26 mmol/L (ref 22–32)
Calcium: 8.8 mg/dL — ABNORMAL LOW (ref 8.9–10.3)
Creatinine: 0.65 mg/dL (ref 0.44–1.00)
GFR, Est AFR Am: >60 mL/min (ref >60)
GFR, Estimated: >60 mL/min (ref >60)
GLUCOSE: 89 mg/dL (ref 70–99)
POTASSIUM: 3.4 mmol/L — AB (ref 3.5–5.1)
SODIUM: 141 mmol/L (ref 135–145)
Total Bilirubin: 0.5 mg/dL (ref 0.3–1.2)
Total Protein: 7 g/dL (ref 6.5–8.1)

## 2018-04-04 LAB — CBC WITH DIFFERENTIAL (CANCER CENTER ONLY)
ABS IMMATURE GRANULOCYTES: 0.09 10*3/uL — AB (ref 0.00–0.07)
Basophils Absolute: 0 10*3/uL (ref 0.0–0.1)
Basophils Relative: 0 %
Eosinophils Absolute: 0 10*3/uL (ref 0.0–0.5)
Eosinophils Relative: 1 %
HCT: 32.1 % — ABNORMAL LOW (ref 36.0–46.0)
HEMOGLOBIN: 10.6 g/dL — AB (ref 12.0–15.0)
IMMATURE GRANULOCYTES: 2 %
LYMPHS PCT: 21 %
Lymphs Abs: 1.2 10*3/uL (ref 0.7–4.0)
MCH: 31 pg (ref 26.0–34.0)
MCHC: 33 g/dL (ref 30.0–36.0)
MCV: 93.9 fL (ref 80.0–100.0)
MONO ABS: 0.9 10*3/uL (ref 0.1–1.0)
MONOS PCT: 16 %
NEUTROS ABS: 3.5 10*3/uL (ref 1.7–7.7)
NEUTROS PCT: 60 %
Platelet Count: 110 10*3/uL — ABNORMAL LOW (ref 150–400)
RBC: 3.42 MIL/uL — ABNORMAL LOW (ref 3.87–5.11)
RDW: 16.1 % — ABNORMAL HIGH (ref 11.5–15.5)
WBC Count: 5.7 10*3/uL (ref 4.0–10.5)
nRBC: 0 % (ref 0.0–0.2)

## 2018-04-04 LAB — TOTAL PROTEIN, URINE DIPSTICK: Protein, ur: NEGATIVE mg/dL

## 2018-04-04 LAB — TSH

## 2018-04-06 ENCOUNTER — Ambulatory Visit: Payer: Medicare Other

## 2018-04-10 ENCOUNTER — Other Ambulatory Visit: Payer: Self-pay | Admitting: Internal Medicine

## 2018-04-11 ENCOUNTER — Inpatient Hospital Stay: Payer: Medicare Other

## 2018-04-11 ENCOUNTER — Encounter: Payer: Self-pay | Admitting: Oncology

## 2018-04-11 ENCOUNTER — Telehealth: Payer: Self-pay | Admitting: Oncology

## 2018-04-11 ENCOUNTER — Other Ambulatory Visit: Payer: Medicare Other

## 2018-04-11 ENCOUNTER — Inpatient Hospital Stay (HOSPITAL_BASED_OUTPATIENT_CLINIC_OR_DEPARTMENT_OTHER): Payer: Medicare Other | Admitting: Oncology

## 2018-04-11 VITALS — BP 148/77 | HR 88

## 2018-04-11 VITALS — BP 142/79 | HR 106 | Temp 97.7°F | Resp 18 | Ht 66.0 in | Wt 205.9 lb

## 2018-04-11 DIAGNOSIS — Z7689 Persons encountering health services in other specified circumstances: Secondary | ICD-10-CM | POA: Diagnosis not present

## 2018-04-11 DIAGNOSIS — Z5111 Encounter for antineoplastic chemotherapy: Secondary | ICD-10-CM

## 2018-04-11 DIAGNOSIS — Z5112 Encounter for antineoplastic immunotherapy: Secondary | ICD-10-CM

## 2018-04-11 DIAGNOSIS — C3492 Malignant neoplasm of unspecified part of left bronchus or lung: Secondary | ICD-10-CM

## 2018-04-11 DIAGNOSIS — R5383 Other fatigue: Secondary | ICD-10-CM

## 2018-04-11 DIAGNOSIS — C3412 Malignant neoplasm of upper lobe, left bronchus or lung: Secondary | ICD-10-CM | POA: Diagnosis not present

## 2018-04-11 DIAGNOSIS — E46 Unspecified protein-calorie malnutrition: Secondary | ICD-10-CM

## 2018-04-11 DIAGNOSIS — C787 Secondary malignant neoplasm of liver and intrahepatic bile duct: Secondary | ICD-10-CM

## 2018-04-11 DIAGNOSIS — I10 Essential (primary) hypertension: Secondary | ICD-10-CM

## 2018-04-11 DIAGNOSIS — Z79899 Other long term (current) drug therapy: Secondary | ICD-10-CM

## 2018-04-11 DIAGNOSIS — Z7901 Long term (current) use of anticoagulants: Secondary | ICD-10-CM

## 2018-04-11 DIAGNOSIS — R634 Abnormal weight loss: Secondary | ICD-10-CM

## 2018-04-11 DIAGNOSIS — M545 Low back pain: Secondary | ICD-10-CM | POA: Diagnosis not present

## 2018-04-11 DIAGNOSIS — C778 Secondary and unspecified malignant neoplasm of lymph nodes of multiple regions: Secondary | ICD-10-CM | POA: Diagnosis not present

## 2018-04-11 LAB — CBC WITH DIFFERENTIAL (CANCER CENTER ONLY)
ABS IMMATURE GRANULOCYTES: 0.01 10*3/uL (ref 0.00–0.07)
BASOS ABS: 0 10*3/uL (ref 0.0–0.1)
BASOS PCT: 0 %
Eosinophils Absolute: 0 10*3/uL (ref 0.0–0.5)
Eosinophils Relative: 1 %
HCT: 30.8 % — ABNORMAL LOW (ref 36.0–46.0)
Hemoglobin: 10.4 g/dL — ABNORMAL LOW (ref 12.0–15.0)
IMMATURE GRANULOCYTES: 0 %
Lymphocytes Relative: 22 %
Lymphs Abs: 1 10*3/uL (ref 0.7–4.0)
MCH: 31.4 pg (ref 26.0–34.0)
MCHC: 33.8 g/dL (ref 30.0–36.0)
MCV: 93.1 fL (ref 80.0–100.0)
Monocytes Absolute: 0.9 10*3/uL (ref 0.1–1.0)
Monocytes Relative: 21 %
NEUTROS ABS: 2.5 10*3/uL (ref 1.7–7.7)
NEUTROS PCT: 56 %
NRBC: 0 % (ref 0.0–0.2)
PLATELETS: 185 10*3/uL (ref 150–400)
RBC: 3.31 MIL/uL — AB (ref 3.87–5.11)
RDW: 16 % — ABNORMAL HIGH (ref 11.5–15.5)
WBC: 4.5 10*3/uL (ref 4.0–10.5)

## 2018-04-11 LAB — CMP (CANCER CENTER ONLY)
ALBUMIN: 3.2 g/dL — AB (ref 3.5–5.0)
ALT: 26 U/L (ref 0–44)
ANION GAP: 10 (ref 5–15)
AST: 41 U/L (ref 15–41)
Alkaline Phosphatase: 161 U/L — ABNORMAL HIGH (ref 38–126)
BILIRUBIN TOTAL: 0.7 mg/dL (ref 0.3–1.2)
BUN: 11 mg/dL (ref 8–23)
CO2: 23 mmol/L (ref 22–32)
Calcium: 8.8 mg/dL — ABNORMAL LOW (ref 8.9–10.3)
Chloride: 107 mmol/L (ref 98–111)
Creatinine: 0.65 mg/dL (ref 0.44–1.00)
GFR, Est AFR Am: >60 mL/min (ref >60)
Glucose, Bld: 109 mg/dL — ABNORMAL HIGH (ref 70–99)
Potassium: 3.6 mmol/L (ref 3.5–5.1)
Sodium: 140 mmol/L (ref 135–145)
TOTAL PROTEIN: 7.1 g/dL (ref 6.5–8.1)

## 2018-04-11 MED ORDER — SODIUM CHLORIDE 0.9 % IV SOLN
1200.0000 mg | Freq: Once | INTRAVENOUS | Status: AC
  Administered 2018-04-11: 1200 mg via INTRAVENOUS
  Filled 2018-04-11: qty 20

## 2018-04-11 MED ORDER — PALONOSETRON HCL INJECTION 0.25 MG/5ML
INTRAVENOUS | Status: AC
  Filled 2018-04-11: qty 5

## 2018-04-11 MED ORDER — FAMOTIDINE IN NACL 20-0.9 MG/50ML-% IV SOLN
20.0000 mg | Freq: Once | INTRAVENOUS | Status: AC
  Administered 2018-04-11: 20 mg via INTRAVENOUS

## 2018-04-11 MED ORDER — SODIUM CHLORIDE 0.9 % IV SOLN
200.0000 mg/m2 | Freq: Once | INTRAVENOUS | Status: AC
  Administered 2018-04-11: 426 mg via INTRAVENOUS
  Filled 2018-04-11: qty 71

## 2018-04-11 MED ORDER — SODIUM CHLORIDE 0.9 % IV SOLN
1500.0000 mg | INTRAVENOUS | Status: DC
  Administered 2018-04-11: 1500 mg via INTRAVENOUS
  Filled 2018-04-11: qty 48

## 2018-04-11 MED ORDER — SODIUM CHLORIDE 0.9 % IV SOLN
Freq: Once | INTRAVENOUS | Status: AC
  Administered 2018-04-11: 11:00:00 via INTRAVENOUS
  Filled 2018-04-11: qty 250

## 2018-04-11 MED ORDER — DIPHENHYDRAMINE HCL 50 MG/ML IJ SOLN
INTRAMUSCULAR | Status: AC
  Filled 2018-04-11: qty 1

## 2018-04-11 MED ORDER — DIPHENHYDRAMINE HCL 50 MG/ML IJ SOLN
50.0000 mg | Freq: Once | INTRAMUSCULAR | Status: AC
  Administered 2018-04-11: 50 mg via INTRAVENOUS

## 2018-04-11 MED ORDER — FAMOTIDINE IN NACL 20-0.9 MG/50ML-% IV SOLN
INTRAVENOUS | Status: AC
  Filled 2018-04-11: qty 50

## 2018-04-11 MED ORDER — SODIUM CHLORIDE 0.9 % IV SOLN
660.0000 mg | Freq: Once | INTRAVENOUS | Status: AC
  Administered 2018-04-11: 660 mg via INTRAVENOUS
  Filled 2018-04-11: qty 66

## 2018-04-11 MED ORDER — SODIUM CHLORIDE 0.9 % IV SOLN
Freq: Once | INTRAVENOUS | Status: AC
  Administered 2018-04-11: 12:00:00 via INTRAVENOUS
  Filled 2018-04-11: qty 5

## 2018-04-11 MED ORDER — PALONOSETRON HCL INJECTION 0.25 MG/5ML
0.2500 mg | Freq: Once | INTRAVENOUS | Status: AC
  Administered 2018-04-11: 0.25 mg via INTRAVENOUS

## 2018-04-11 NOTE — Assessment & Plan Note (Addendum)
This is a very pleasant 67 year old white female with a stage IV non-small cell lung cancer, adenocarcinoma with positive EGFR mutation in exon 21 (L858R) diagnosed in May 2015 status post treatment with Tarceva for 13 months discontinued secondary to disease progression and development of T790M resistant mutation. The patient is currently undergoing treatment with Tagrisso 80 mg by mouth daily status post 34 months. I also referred the patient to Dr. Tammi Klippel from radiation oncology for consideration of palliative radiotherapy to the enlarging left upper lobe suprahilar mass to avoid further obstruction of her bronchial tree. Repeat CT scan of the chest, abdomen and pelvis showed findings for disease progression especially in the liver. The patient discontinued her treatment with Tagrisso and she was started on systemic chemotherapy with carboplatin for AUC of 6, paclitaxel 200 mg/M2, Avastin 15 mg/KG as well as Tecentriq (Atezolizumab) 1200 mg IV every 3 weeks status post 2 cycles.  She is tolerating treatment fairly well with the exception of fatigue, decreased appetite, and weight loss.  Labs been reviewed.  Recommend for her to proceed with cycle #3 of her treatment today as scheduled.  She will continue to have weekly labs.  She will have a restaging CT scan of the chest, abdomen, pelvis prior to her next visit.  She will follow-up in 3 weeks for evaluation prior to cycle #4 and to review her restaging CT scan results.  For the DVT and pulmonary embolism, she will continue on Eliquis.   For the decreased appetite and weight loss, I have recommended that she increase Ensure to twice a day.  I have also referred her to the dietitian.  She was advised to call immediately if she has any concerning symptoms in the interval. For the low back pain this is likely secondary to degenerative disc disease seen on the recently scan, I will start the patient on Percocet 5/325 mg p.o. every 6 hours as needed. The  patient voices understanding of current disease status and treatment options and is in agreement with the current care plan. All questions were answered. The patient knows to call the clinic with any problems, questions or concerns. We can certainly see the patient much sooner if necessary.

## 2018-04-11 NOTE — Telephone Encounter (Signed)
Scheduled appt per 11/14 los - pt to get an updated schedule next visit.

## 2018-04-11 NOTE — Progress Notes (Signed)
[facility] OFFICE PROGRESS NOTE  System, Pcp Not In No address on file  DIAGNOSIS: Stage IV (T1b, N3, M1b) non-small cell lung cancer, adenocarcinoma with positive EGFR mutation in exon 21 (L858R) and recently found to have T790M resistant mutation, presented with left upper lobe lung mass in addition to left hilar and bilateral mediastinal lymphadenopathy as well as liver metastasis diagnosed in May of 2015.  PRIOR THERAPY: 1) Tarceva 150 mg by mouth daily. Started 11/25/2013. Status post 5 months of treatment discontinued today secondary to intolerance with persistent paronychia. 2) Tarceva 100 mg by mouth daily. Started 06/03/2014. Status post 8 months of treatment.  3) stereotactic radiotherapy to liver lesions under the care of Dr. Tammi Klippel. Last dose 03/02/2017 4) Tagrisso 80 mg by mouth daily status post 34 months of treatment. First dose was given 03/04/2015.  This was discontinued secondary to disease progression.   CURRENT THERAPY: Systemic chemotherapy with carboplatin for AUC of 6, paclitaxel 200 mg/M2, Avastin 15 mg/KG and Tecentriq 1200 mg IV every 3 weeks with Neulasta support.  First dose 02/21/2018.  Status post 2 cycles.  INTERVAL HISTORY: Debra Hodges 67 y.o. female returns for routine follow-up visit accompanied by her husband.  The patient is feeling fine today and has no specific complaints except for ongoing fatigue.  She has her baseline shortness of breath with exertion which is unchanged.  Reports pain on the left side of her back particularly when she takes a deep breath in or coughs.  Uses Tylenol which she states is effective.  Has had oxycodone in the past but is out of medication.  Denies fevers and chills.  Denies chest pain, shortness of breath at rest, hemoptysis.  Denies nausea, vomiting, constipation, diarrhea.  She reports her appetite is decreased and she is drinking 1 can of Ensure per day.  Denies night sweats.  The patient is here for  evaluation prior to cycle #3 of treatment.  MEDICAL HISTORY: Past Medical History:  Diagnosis Date  . High blood pressure   . Liver lesion 06/26/2016  . Non-small cell carcinoma of lung, stage 4 ([place]) dx'd 10/2013    ALLERGIES:  is allergic to peanut-containing drug products and penicillins.  MEDICATIONS:  Current Outpatient Medications  Medication Sig Dispense Refill  . acetaminophen (TYLENOL) 325 MG tablet Take 650 mg by mouth every 6 (six) hours as needed. Reported on 06/15/2015    . apixaban (ELIQUIS) 5 MG TABS tablet Take 1 tablet (5 mg total) by mouth 2 (two) times daily. 60 tablet 0  . calcium carbonate (OS-CAL) 600 MG TABS tablet Take 600 mg by mouth daily.     Marland Kitchen CLIMARA 0.1 MG/24HR patch 1 patch once a week.    . levothyroxine (SYNTHROID, LEVOTHROID) 50 MCG tablet Take 50 mcg by mouth daily.    Marland Kitchen loperamide (IMODIUM) 2 MG capsule Take by mouth as needed for diarrhea or loose stools. Reported on 11/22/2015    . loratadine (CLARITIN) 10 MG tablet Take 10 mg by mouth daily.    . Multiple Vitamins-Calcium (ONE-A-DAY WOMENS PO) Take 1 tablet by mouth daily.    . potassium chloride SA (K-DUR,KLOR-CON) 20 MEQ tablet 1 tablet daily.     . prochlorperazine (COMPAZINE) 10 MG tablet TAKE 1 TABLET(10 MG) BY MOUTH EVERY 6 HOURS AS NEEDED FOR NAUSEA OR VOMITING 385 tablet 3  . Sodium Fluoride (CLINPRO 5000) 1.1 % PSTE Place 1 application onto teeth 3 (three) times daily.    Marland Kitchen spironolactone (ALDACTONE) 25 MG  tablet 1 tablet daily.    . vitamin C (ASCORBIC ACID) 500 MG tablet Take 500 mg by mouth daily.    Marland Kitchen apixaban (ELIQUIS) 5 MG TABS tablet Take 2 tablets (10 mg total) by mouth 2 (two) times daily. (Patient not taking: Reported on 04/11/2018) 24 tablet 0  . oxyCODONE-acetaminophen (PERCOCET/ROXICET) 5-325 MG tablet Take 1 tablet by mouth every 6 (six) hours as needed for severe pain. (Patient not taking: Reported on 04/11/2018) 30 tablet 0   No current facility-administered medications for  this visit.    Facility-Administered Medications Ordered in Other Visits  Medication Dose Route Frequency Provider Last Rate Last Dose  . bevacizumab (AVASTIN) 1,500 mg in sodium chloride 0.9 % 100 mL chemo infusion  1,500 mg Intravenous Q21 days Curt Bears, MD 320 mL/hr at 04/11/18 1323 1,500 mg at 04/11/18 1323  . CARBOplatin (PARAPLATIN) 660 mg in sodium chloride 0.9 % 250 mL chemo infusion  660 mg Intravenous Once Curt Bears, MD      . PACLitaxel (TAXOL) 426 mg in sodium chloride 0.9 % 500 mL chemo infusion (> 58m/m2)  200 mg/m2 (Treatment Plan Recorded) Intravenous Once MCurt Bears MD 190 mL/hr at 04/11/18 1400 426 mg at 04/11/18 1400    SURGICAL HISTORY:  Past Surgical History:  Procedure Laterality Date  . ABDOMINAL HYSTERECTOMY    . BREAST BIOPSY     Right breast  . VIDEO BRONCHOSCOPY Bilateral 10/27/2013   Procedure: VIDEO BRONCHOSCOPY WITH FLUORO;  Surgeon: RCollene Gobble MD;  Location: [facility];  Service: Cardiopulmonary;  Laterality: Bilateral;    REVIEW OF SYSTEMS:   Review of Systems  Constitutional: Negative for chills, fever.  Positive for fatigue, decreased appetite, and weight loss. HENT:   Negative for mouth sores, nosebleeds, sore throat and trouble swallowing.   Eyes: Negative for eye problems and icterus.  Respiratory: Negative for hemoptysis, shortness of breath at rest and wheezing.  Positive for shortness of breath with exertion. Cardiovascular: Negative for chest pain and leg swelling.  Gastrointestinal: Negative for abdominal pain, constipation, diarrhea, nausea and vomiting.  Genitourinary: Negative for bladder incontinence, difficulty urinating, dysuria, frequency and hematuria.   Musculoskeletal: Negative for neck pain and neck stiffness.  Positive for left-sided back pain when she takes a deep breath or coughs. Skin: Negative for itching and rash.  Neurological: Negative for dizziness, extremity weakness, headaches, light-headedness  and seizures.  Hematological: Negative for adenopathy. Does not bruise/bleed easily.  Psychiatric/Behavioral: Negative for confusion, depression and sleep disturbance. The patient is not nervous/anxious.     PHYSICAL EXAMINATION:  Blood pressure (!) 142/79, pulse (!) 106, temperature 97.7 F (36.5 C), temperature source Oral, resp. rate 18, height _0  (1.676 m), weight 205 lb 14.4 oz (93.4 kg), SpO2 99 %.  ECOG PERFORMANCE STATUS: 1 - Symptomatic but completely ambulatory  Physical Exam  Constitutional: Oriented to person, place, and time and well-developed, well-nourished, and in no distress. No distress.  HENT:  Head: Normocephalic and atraumatic.  Mouth/Throat: Oropharynx is clear and moist. No oropharyngeal exudate.  Eyes: Conjunctivae are normal. Right eye exhibits no discharge. Left eye exhibits no discharge. No scleral icterus.  Neck: Normal range of motion. Neck supple.  Cardiovascular: Normal rate, regular rhythm, normal heart sounds and intact distal pulses.   Pulmonary/Chest: Effort normal and breath sounds normal. No respiratory distress. No wheezes. No rales.  Abdominal: Soft. Bowel sounds are normal. Exhibits no distension and no mass. There is no tenderness.  Musculoskeletal: Normal range of motion. Exhibits no edema.  Lymphadenopathy:    No cervical adenopathy.  Neurological: Alert and oriented to person, place, and time. Exhibits normal muscle tone. Coordination normal.  Skin: Skin is warm and dry. No rash noted. Not diaphoretic. No erythema. No pallor.  Psychiatric: Mood, memory and judgment normal.  Vitals reviewed.  LABORATORY DATA: Lab Results  Component Value Date   WBC 4.5 04/11/2018   HGB 10.4 (L) 04/11/2018   HCT 30.8 (L) 04/11/2018   MCV 93.1 04/11/2018   PLT 185 04/11/2018      Chemistry      Component Value Date/Time   NA 140 04/11/2018 0945   NA 141 04/30/2017 1541   K 3.6 04/11/2018 0945   K 4.1 04/30/2017 1541   CL 107 04/11/2018 0945    CO2 23 04/11/2018 0945   CO2 26 04/30/2017 1541   BUN 11 04/11/2018 0945   BUN 14.1 04/30/2017 1541   CREATININE 0.65 04/11/2018 0945   CREATININE 0.8 04/30/2017 1541      Component Value Date/Time   CALCIUM 8.8 (L) 04/11/2018 0945   CALCIUM 9.6 04/30/2017 1541   ALKPHOS 161 (H) 04/11/2018 0945   ALKPHOS 105 04/30/2017 1541   AST 41 04/11/2018 0945   AST 40 (H) 04/30/2017 1541   ALT 26 04/11/2018 0945   ALT 27 04/30/2017 1541   BILITOT 0.7 04/11/2018 0945   BILITOT 0.55 04/30/2017 1541       RADIOGRAPHIC STUDIES:  No results found.   ASSESSMENT/PLAN:  Non-small cell carcinoma of lung, stage 4 ([facility]) This is a very pleasant 67 year old white female with a stage IV non-small cell lung cancer, adenocarcinoma with positive EGFR mutation in exon 21 (L858R) diagnosed in May 2015 status post treatment with Tarceva for 13 months discontinued secondary to disease progression and development of T790M resistant mutation. The patient is currently undergoing treatment with Tagrisso 80 mg by mouth daily status post 34 months. I also referred the patient to Dr. Tammi Klippel from radiation oncology for consideration of palliative radiotherapy to the enlarging left upper lobe suprahilar mass to avoid further obstruction of her bronchial tree. Repeat CT scan of the chest, abdomen and pelvis showed findings for disease progression especially in the liver. The patient discontinued her treatment with Tagrisso and she was started on systemic chemotherapy with carboplatin for AUC of 6, paclitaxel 200 mg/M2, Avastin 15 mg/KG as well as Tecentriq (Atezolizumab) 1200 mg IV every 3 weeks status post 2 cycles.  She is tolerating treatment fairly well with the exception of fatigue, decreased appetite, and weight loss.  Labs been reviewed.  Recommend for her to proceed with cycle #3 of her treatment today as scheduled.  She will continue to have weekly labs.  She will have a restaging CT scan of the chest, abdomen,  pelvis prior to her next visit.  She will follow-up in 3 weeks for evaluation prior to cycle #4 and to review her restaging CT scan results.  For the DVT and pulmonary embolism, she will continue on Eliquis.   For the decreased appetite and weight loss, I have recommended that she increase Ensure to twice a day.  I have also referred her to the dietitian.  She was advised to call immediately if she has any concerning symptoms in the interval. For the low back pain this is likely secondary to degenerative disc disease seen on the recently scan, I will start the patient on Percocet 5/325 mg p.o. every 6 hours as needed. The patient voices understanding of current disease  status and treatment options and is in agreement with the current care plan. All questions were answered. The patient knows to call the clinic with any problems, questions or concerns. We can certainly see the patient much sooner if necessary.   Orders Placed This Encounter  Procedures  . CT ABDOMEN PELVIS W CONTRAST    Standing Status:   Future    Standing Expiration Date:   04/12/2019    Order Specific Question:   If indicated for the ordered procedure, I authorize the administration of contrast media per Radiology protocol    Answer:   Yes    Order Specific Question:   Preferred imaging location?    Answer:   [facility]    Order Specific Question:   Radiology Contrast Protocol - do NOT remove file path    Answer:   \\[email or web address]    Order Specific Question:   ** REASON FOR EXAM (FREE TEXT)    Answer:   Lung cancer. Restaging.  . CT CHEST W CONTRAST    Standing Status:   Future    Standing Expiration Date:   04/12/2019    Order Specific Question:   If indicated for the ordered procedure, I authorize the administration of contrast media per Radiology protocol    Answer:   Yes    Order Specific Question:   Preferred imaging location?    Answer:   [facility]    Order  Specific Question:   Radiology Contrast Protocol - do NOT remove file path    Answer:   \\[email or web address]    Order Specific Question:   ** REASON FOR EXAM (FREE TEXT)    Answer:   Lung cancer. Restaging.  . Amb Referral to Nutrition and Diabetic E    Referral Priority:   Routine    Referral Type:   Consultation    Referral Reason:   Specialty Services Required    Number of Visits Requested:   [place], DNP, AGPCNP-BC, AOCNP 04/11/18

## 2018-04-11 NOTE — Patient Instructions (Signed)
[facility] Discharge Instructions for Patients Receiving Chemotherapy  Today you received the following chemotherapy agents: Avastin, Tecentriq, Paclitaxel, and Carboplatin  To help prevent nausea and vomiting after your treatment, we encourage you to take your nausea medication as prescribed.   If you develop nausea and vomiting that is not controlled by your nausea medication, call the clinic.   BELOW ARE SYMPTOMS THAT SHOULD BE REPORTED IMMEDIATELY:  *FEVER GREATER THAN 100.5 F  *CHILLS WITH OR WITHOUT FEVER  NAUSEA AND VOMITING THAT IS NOT CONTROLLED WITH YOUR NAUSEA MEDICATION  *UNUSUAL SHORTNESS OF BREATH  *UNUSUAL BRUISING OR BLEEDING  TENDERNESS IN MOUTH AND THROAT WITH OR WITHOUT PRESENCE OF ULCERS  *URINARY PROBLEMS  *BOWEL PROBLEMS  UNUSUAL RASH Items with * indicate a potential emergency and should be followed up as soon as possible.  Feel free to call the clinic should you have any questions or concerns. The clinic phone number is [phone].  Please show the [place] at check-in to the Emergency Department and triage nurse.

## 2018-04-13 ENCOUNTER — Inpatient Hospital Stay: Payer: Medicare Other

## 2018-04-13 VITALS — BP 150/73 | HR 99 | Temp 98.4°F | Resp 18

## 2018-04-13 DIAGNOSIS — Z7689 Persons encountering health services in other specified circumstances: Secondary | ICD-10-CM | POA: Diagnosis not present

## 2018-04-13 DIAGNOSIS — C3412 Malignant neoplasm of upper lobe, left bronchus or lung: Secondary | ICD-10-CM | POA: Diagnosis not present

## 2018-04-13 DIAGNOSIS — C787 Secondary malignant neoplasm of liver and intrahepatic bile duct: Secondary | ICD-10-CM | POA: Diagnosis not present

## 2018-04-13 DIAGNOSIS — C3492 Malignant neoplasm of unspecified part of left bronchus or lung: Secondary | ICD-10-CM

## 2018-04-13 DIAGNOSIS — C778 Secondary and unspecified malignant neoplasm of lymph nodes of multiple regions: Secondary | ICD-10-CM | POA: Diagnosis not present

## 2018-04-13 DIAGNOSIS — Z5111 Encounter for antineoplastic chemotherapy: Secondary | ICD-10-CM | POA: Diagnosis not present

## 2018-04-13 DIAGNOSIS — M545 Low back pain: Secondary | ICD-10-CM | POA: Diagnosis not present

## 2018-04-13 MED ORDER — PEGFILGRASTIM INJECTION 6 MG/0.6ML ~~LOC~~
PREFILLED_SYRINGE | SUBCUTANEOUS | Status: AC
  Filled 2018-04-13: qty 0.6

## 2018-04-13 MED ORDER — PEGFILGRASTIM INJECTION 6 MG/0.6ML ~~LOC~~
6.0000 mg | PREFILLED_SYRINGE | Freq: Once | SUBCUTANEOUS | Status: AC
  Administered 2018-04-13: 6 mg via SUBCUTANEOUS

## 2018-04-13 NOTE — Patient Instructions (Signed)
Pegfilgrastim injection What is this medicine? PEGFILGRASTIM (PEG fil gra stim) is a long-acting granulocyte colony-stimulating factor that stimulates the growth of neutrophils, a type of white blood cell important in the body's fight against infection. It is used to reduce the incidence of fever and infection in patients with certain types of cancer who are receiving chemotherapy that affects the bone marrow, and to increase survival after being exposed to high doses of radiation. This medicine may be used for other purposes; ask your health care provider or pharmacist if you have questions. COMMON BRAND NAME(S): Neulasta What should I tell my health care provider before I take this medicine? They need to know if you have any of these conditions: -kidney disease -latex allergy -ongoing radiation therapy -sickle cell disease -skin reactions to acrylic adhesives (On-Body Injector only) -an unusual or allergic reaction to pegfilgrastim, filgrastim, other medicines, foods, dyes, or preservatives -pregnant or trying to get pregnant -breast-feeding How should I use this medicine? This medicine is for injection under the skin. If you get this medicine at home, you will be taught how to prepare and give the pre-filled syringe or how to use the On-body Injector. Refer to the patient Instructions for Use for detailed instructions. Use exactly as directed. Tell your healthcare provider immediately if you suspect that the On-body Injector may not have performed as intended or if you suspect the use of the On-body Injector resulted in a missed or partial dose. It is important that you put your used needles and syringes in a special sharps container. Do not put them in a trash can. If you do not have a sharps container, call your pharmacist or healthcare provider to get one. Talk to your pediatrician regarding the use of this medicine in children. While this drug may be prescribed for selected conditions,  precautions do apply. Overdosage: If you think you have taken too much of this medicine contact a poison control center or emergency room at once. NOTE: This medicine is only for you. Do not share this medicine with others. What if I miss a dose? It is important not to miss your dose. Call your doctor or health care professional if you miss your dose. If you miss a dose due to an On-body Injector failure or leakage, a new dose should be administered as soon as possible using a single prefilled syringe for manual use. What may interact with this medicine? Interactions have not been studied. Give your health care provider a list of all the medicines, herbs, non-prescription drugs, or dietary supplements you use. Also tell them if you smoke, drink alcohol, or use illegal drugs. Some items may interact with your medicine. This list may not describe all possible interactions. Give your health care provider a list of all the medicines, herbs, non-prescription drugs, or dietary supplements you use. Also tell them if you smoke, drink alcohol, or use illegal drugs. Some items may interact with your medicine. What should I watch for while using this medicine? You may need blood work done while you are taking this medicine. If you are going to need a MRI, CT scan, or other procedure, tell your doctor that you are using this medicine (On-Body Injector only). What side effects may I notice from receiving this medicine? Side effects that you should report to your doctor or health care professional as soon as possible: -allergic reactions like skin rash, itching or hives, swelling of the face, lips, or tongue -dizziness -fever -pain, redness, or irritation at site   where injected -pinpoint red spots on the skin -red or dark-brown urine -shortness of breath or breathing problems -stomach or side pain, or pain at the shoulder -swelling -tiredness -trouble passing urine or change in the amount of urine Side  effects that usually do not require medical attention (report to your doctor or health care professional if they continue or are bothersome): -bone pain -muscle pain This list may not describe all possible side effects. Call your doctor for medical advice about side effects. You may report side effects to FDA at 1-800-FDA-1088. Where should I keep my medicine? Keep out of the reach of children. Store pre-filled syringes in a refrigerator between 2 and 8 degrees C (36 and 46 degrees F). Do not freeze. Keep in carton to protect from light. Throw away this medicine if it is left out of the refrigerator for more than 48 hours. Throw away any unused medicine after the expiration date. NOTE: This sheet is a summary. It may not cover all possible information. If you have questions about this medicine, talk to your doctor, pharmacist, or health care provider.  2018 Elsevier/Gold Standard (2016-05-11 12:58:03)  

## 2018-04-17 ENCOUNTER — Other Ambulatory Visit: Payer: Self-pay | Admitting: *Deleted

## 2018-04-17 MED ORDER — APIXABAN 5 MG PO TABS: 10.0000 mg | ORAL_TABLET | Freq: Two times a day (BID) | ORAL | 0 refills | Status: DC

## 2018-04-17 NOTE — Telephone Encounter (Signed)
Pt request for refill on Elliquis. Reviewed w/MD, rx refill sent to pt pharmacy.

## 2018-04-18 ENCOUNTER — Inpatient Hospital Stay (HOSPITAL_COMMUNITY)
Admission: EM | Admit: 2018-04-18 | Discharge: 2018-04-20 | DRG: 180 | Disposition: A | Discharge status: Home / Self Care | Payer: Medicare Other | Attending: Internal Medicine | Admitting: Internal Medicine

## 2018-04-18 ENCOUNTER — Other Ambulatory Visit: Payer: Self-pay

## 2018-04-18 ENCOUNTER — Telehealth: Payer: Self-pay | Admitting: Medical Oncology

## 2018-04-18 ENCOUNTER — Other Ambulatory Visit: Payer: Medicare Other

## 2018-04-18 ENCOUNTER — Emergency Department (HOSPITAL_COMMUNITY): Payer: Medicare Other

## 2018-04-18 ENCOUNTER — Inpatient Hospital Stay: Payer: Medicare Other

## 2018-04-18 ENCOUNTER — Encounter (HOSPITAL_COMMUNITY): Payer: Self-pay | Admitting: Emergency Medicine

## 2018-04-18 DIAGNOSIS — Z91010 Allergy to peanuts: Secondary | ICD-10-CM | POA: Diagnosis not present

## 2018-04-18 DIAGNOSIS — R112 Nausea with vomiting, unspecified: Secondary | ICD-10-CM | POA: Diagnosis present

## 2018-04-18 DIAGNOSIS — Z9221 Personal history of antineoplastic chemotherapy: Secondary | ICD-10-CM | POA: Diagnosis not present

## 2018-04-18 DIAGNOSIS — D701 Agranulocytosis secondary to cancer chemotherapy: Secondary | ICD-10-CM

## 2018-04-18 DIAGNOSIS — R110 Nausea: Secondary | ICD-10-CM

## 2018-04-18 DIAGNOSIS — I2699 Other pulmonary embolism without acute cor pulmonale: Secondary | ICD-10-CM | POA: Diagnosis present

## 2018-04-18 DIAGNOSIS — Z86718 Personal history of other venous thrombosis and embolism: Secondary | ICD-10-CM

## 2018-04-18 DIAGNOSIS — D702 Other drug-induced agranulocytosis: Secondary | ICD-10-CM

## 2018-04-18 DIAGNOSIS — C3412 Malignant neoplasm of upper lobe, left bronchus or lung: Secondary | ICD-10-CM | POA: Diagnosis present

## 2018-04-18 DIAGNOSIS — Z7901 Long term (current) use of anticoagulants: Secondary | ICD-10-CM

## 2018-04-18 DIAGNOSIS — D709 Neutropenia, unspecified: Secondary | ICD-10-CM

## 2018-04-18 DIAGNOSIS — T451X5A Adverse effect of antineoplastic and immunosuppressive drugs, initial encounter: Secondary | ICD-10-CM | POA: Diagnosis not present

## 2018-04-18 DIAGNOSIS — C3492 Malignant neoplasm of unspecified part of left bronchus or lung: Secondary | ICD-10-CM

## 2018-04-18 DIAGNOSIS — E039 Hypothyroidism, unspecified: Secondary | ICD-10-CM | POA: Diagnosis present

## 2018-04-18 DIAGNOSIS — R05 Cough: Secondary | ICD-10-CM | POA: Diagnosis not present

## 2018-04-18 DIAGNOSIS — Z79890 Hormone replacement therapy (postmenopausal): Secondary | ICD-10-CM | POA: Diagnosis not present

## 2018-04-18 DIAGNOSIS — Z79899 Other long term (current) drug therapy: Secondary | ICD-10-CM | POA: Diagnosis not present

## 2018-04-18 DIAGNOSIS — C3490 Malignant neoplasm of unspecified part of unspecified bronchus or lung: Secondary | ICD-10-CM | POA: Diagnosis present

## 2018-04-18 DIAGNOSIS — C787 Secondary malignant neoplasm of liver and intrahepatic bile duct: Secondary | ICD-10-CM | POA: Diagnosis present

## 2018-04-18 DIAGNOSIS — Z880 Allergy status to penicillin: Secondary | ICD-10-CM

## 2018-04-18 DIAGNOSIS — E860 Dehydration: Secondary | ICD-10-CM | POA: Diagnosis present

## 2018-04-18 DIAGNOSIS — Z86711 Personal history of pulmonary embolism: Secondary | ICD-10-CM

## 2018-04-18 DIAGNOSIS — R531 Weakness: Secondary | ICD-10-CM | POA: Diagnosis not present

## 2018-04-18 DIAGNOSIS — T451X5S Adverse effect of antineoplastic and immunosuppressive drugs, sequela: Secondary | ICD-10-CM | POA: Diagnosis not present

## 2018-04-18 LAB — CBC WITH DIFFERENTIAL (CANCER CENTER ONLY)
Abs Immature Granulocytes: 0 10*3/uL (ref 0.00–0.07)
Basophils Absolute: 0 10*3/uL (ref 0.0–0.1)
Basophils Relative: 1 %
Eosinophils Absolute: 0 10*3/uL (ref 0.0–0.5)
Eosinophils Relative: 0 %
HCT: 28.6 % — ABNORMAL LOW (ref 36.0–46.0)
Hemoglobin: 9.9 g/dL — ABNORMAL LOW (ref 12.0–15.0)
Lymphocytes Relative: 83 %
Lymphs Abs: 0.8 10*3/uL (ref 0.7–4.0)
MCH: 31.8 pg (ref 26.0–34.0)
MCHC: 34.6 g/dL (ref 30.0–36.0)
MCV: 92 fL (ref 80.0–100.0)
Monocytes Absolute: 0.1 10*3/uL (ref 0.1–1.0)
Monocytes Relative: 13 %
Neutro Abs: 0 10*3/uL — CL (ref 1.7–17.7)
Neutrophils Relative %: 3 %
Platelet Count: 50 10*3/uL — ABNORMAL LOW (ref 150–400)
RBC: 3.11 MIL/uL — ABNORMAL LOW (ref 3.87–5.11)
RDW: 15.1 % (ref 11.5–15.5)
WBC Count: 1 10*3/uL — ABNORMAL LOW (ref 4.0–10.5)
nRBC: 0 % (ref 0.0–0.2)

## 2018-04-18 LAB — I-STAT CG4 LACTIC ACID, ED
LACTIC ACID, VENOUS: 2.17 mmol/L — AB (ref 0.5–1.9)
Lactic Acid, Venous: 1.27 mmol/L (ref 0.5–1.9)

## 2018-04-18 LAB — CBC WITH DIFFERENTIAL/PLATELET
Abs Immature Granulocytes: 0.01 10*3/uL (ref 0.00–0.07)
BASOS ABS: 0 10*3/uL (ref 0.0–0.1)
Basophils Relative: 0 %
EOS ABS: 0 10*3/uL (ref 0.0–0.5)
EOS PCT: 1 %
HEMATOCRIT: 29.5 % — AB (ref 36.0–46.0)
HEMOGLOBIN: 9.9 g/dL — AB (ref 12.0–15.0)
Immature Granulocytes: 1 %
LYMPHS ABS: 0.5 10*3/uL — AB (ref 0.7–4.0)
Lymphocytes Relative: 59 %
MCH: 31.2 pg (ref 26.0–34.0)
MCHC: 33.6 g/dL (ref 30.0–36.0)
MCV: 93.1 fL (ref 80.0–100.0)
MONO ABS: 0.3 10*3/uL (ref 0.1–1.0)
Monocytes Relative: 37 %
NRBC: 0 % (ref 0.0–0.2)
Neutro Abs: 0 10*3/uL — ABNORMAL LOW (ref 1.7–7.7)
Neutrophils Relative %: 2 %
Platelets: 48 10*3/uL — ABNORMAL LOW (ref 150–400)
RBC: 3.17 MIL/uL — ABNORMAL LOW (ref 3.87–5.11)
RDW: 15.2 % (ref 11.5–15.5)
WBC: 0.8 10*3/uL — CL (ref 4.0–10.5)

## 2018-04-18 LAB — COMPREHENSIVE METABOLIC PANEL
ALK PHOS: 132 U/L — AB (ref 38–126)
ALT: 33 U/L (ref 0–44)
AST: 37 U/L (ref 15–41)
Albumin: 3.7 g/dL (ref 3.5–5.0)
Anion gap: 9 (ref 5–15)
BUN: 18 mg/dL (ref 8–23)
CALCIUM: 9.1 mg/dL (ref 8.9–10.3)
CO2: 25 mmol/L (ref 22–32)
CREATININE: 0.52 mg/dL (ref 0.44–1.00)
Chloride: 100 mmol/L (ref 98–111)
GFR calc Af Amer: >60 mL/min (ref >60)
Glucose, Bld: 127 mg/dL — ABNORMAL HIGH (ref 70–99)
Potassium: 3.2 mmol/L — ABNORMAL LOW (ref 3.5–5.1)
Sodium: 134 mmol/L — ABNORMAL LOW (ref 135–145)
Total Bilirubin: 1.1 mg/dL (ref 0.3–1.2)
Total Protein: 7.2 g/dL (ref 6.5–8.1)

## 2018-04-18 LAB — CMP (CANCER CENTER ONLY)
ALT: 34 U/L (ref 0–44)
AST: 31 U/L (ref 15–41)
Albumin: 3.3 g/dL — ABNORMAL LOW (ref 3.5–5.0)
Alkaline Phosphatase: 147 U/L — ABNORMAL HIGH (ref 38–126)
Anion gap: 11 (ref 5–15)
BUN: 14 mg/dL (ref 8–23)
CO2: 26 mmol/L (ref 22–32)
Calcium: 9.4 mg/dL (ref 8.9–10.3)
Chloride: 101 mmol/L (ref 98–111)
Creatinine: 0.64 mg/dL (ref 0.44–1.00)
GFR, Est AFR Am: >60 mL/min (ref >60)
GFR, Estimated: >60 mL/min (ref >60)
Glucose, Bld: 94 mg/dL (ref 70–99)
Potassium: 4 mmol/L (ref 3.5–5.1)
Sodium: 138 mmol/L (ref 135–145)
Total Bilirubin: 1 mg/dL (ref 0.3–1.2)
Total Protein: 7.1 g/dL (ref 6.5–8.1)

## 2018-04-18 LAB — URINALYSIS, ROUTINE W REFLEX MICROSCOPIC
BILIRUBIN URINE: NEGATIVE
Glucose, UA: NEGATIVE mg/dL
Hgb urine dipstick: NEGATIVE
KETONES UR: NEGATIVE mg/dL
Leukocytes, UA: NEGATIVE
NITRITE: NEGATIVE
PH: 7 (ref 5.0–8.0)
Protein, ur: NEGATIVE mg/dL
Specific Gravity, Urine: 1.01 (ref 1.005–1.030)

## 2018-04-18 MED ORDER — BISACODYL 5 MG PO TBEC: 5.0000 mg | DELAYED_RELEASE_TABLET | Freq: Every day | ORAL | Status: DC | PRN

## 2018-04-18 MED ORDER — APIXABAN 5 MG PO TABS
5.0000 mg | ORAL_TABLET | Freq: Two times a day (BID) | ORAL | Status: DC
  Administered 2018-04-18 – 2018-04-20 (×4): 5 mg via ORAL
  Filled 2018-04-18 (×4): qty 1

## 2018-04-18 MED ORDER — ESTRADIOL 0.1 MG/24HR TD PTWK
0.1000 mg | MEDICATED_PATCH | TRANSDERMAL | Status: DC
  Filled 2018-04-18: qty 1

## 2018-04-18 MED ORDER — OXYCODONE HCL 5 MG PO TABS
5.0000 mg | ORAL_TABLET | ORAL | Status: DC | PRN
  Filled 2018-04-18: qty 1

## 2018-04-18 MED ORDER — ONDANSETRON HCL 4 MG/2ML IJ SOLN
4.0000 mg | Freq: Once | INTRAMUSCULAR | Status: AC
  Administered 2018-04-18: 4 mg via INTRAVENOUS
  Filled 2018-04-18: qty 2

## 2018-04-18 MED ORDER — SODIUM CHLORIDE 0.9 % IV SOLN
INTRAVENOUS | Status: DC
  Administered 2018-04-18 – 2018-04-19 (×2): via INTRAVENOUS

## 2018-04-18 MED ORDER — LOPERAMIDE HCL 2 MG PO CAPS: 2.0000 mg | ORAL_CAPSULE | Freq: Every day | ORAL | Status: DC | PRN

## 2018-04-18 MED ORDER — LORATADINE 10 MG PO TABS
10.0000 mg | ORAL_TABLET | Freq: Every day | ORAL | Status: DC
  Administered 2018-04-18 – 2018-04-20 (×3): 10 mg via ORAL
  Filled 2018-04-18 (×3): qty 1

## 2018-04-18 MED ORDER — CIPROFLOXACIN HCL 500 MG PO TABS: 500.0000 mg | ORAL_TABLET | Freq: Once | ORAL | Status: DC

## 2018-04-18 MED ORDER — ACETAMINOPHEN 325 MG PO TABS
650.0000 mg | ORAL_TABLET | Freq: Four times a day (QID) | ORAL | Status: DC | PRN
  Administered 2018-04-19 – 2018-04-20 (×5): 650 mg via ORAL
  Filled 2018-04-18 (×5): qty 2

## 2018-04-18 MED ORDER — SODIUM CHLORIDE 0.9 % IV BOLUS
1000.0000 mL | Freq: Once | INTRAVENOUS | Status: AC
  Administered 2018-04-18: 1000 mL via INTRAVENOUS

## 2018-04-18 MED ORDER — SODIUM FLUORIDE 1.1 % DT PSTE: 1.0000 "application " | PASTE | Freq: Two times a day (BID) | DENTAL | Status: DC

## 2018-04-18 MED ORDER — CALCIUM CARBONATE 1250 (500 CA) MG PO TABS
625.0000 mg | ORAL_TABLET | Freq: Every day | ORAL | Status: DC
  Administered 2018-04-19 – 2018-04-20 (×2): 625 mg via ORAL
  Filled 2018-04-18 (×2): qty 1

## 2018-04-18 MED ORDER — LEVOTHYROXINE SODIUM 50 MCG PO TABS
50.0000 ug | ORAL_TABLET | Freq: Every day | ORAL | Status: DC
  Administered 2018-04-18 – 2018-04-19 (×2): 50 ug via ORAL
  Filled 2018-04-18 (×2): qty 1

## 2018-04-18 MED ORDER — VITAMIN C 500 MG PO TABS
500.0000 mg | ORAL_TABLET | Freq: Every day | ORAL | Status: DC
  Administered 2018-04-18 – 2018-04-20 (×3): 500 mg via ORAL
  Filled 2018-04-18 (×3): qty 1

## 2018-04-18 MED ORDER — ACETAMINOPHEN 650 MG RE SUPP: 650.0000 mg | Freq: Four times a day (QID) | RECTAL | Status: DC | PRN

## 2018-04-18 MED ORDER — SPIRONOLACTONE 25 MG PO TABS
25.0000 mg | ORAL_TABLET | Freq: Every day | ORAL | Status: DC
  Administered 2018-04-18 – 2018-04-20 (×3): 25 mg via ORAL
  Filled 2018-04-18 (×3): qty 1

## 2018-04-18 MED ORDER — POLYETHYLENE GLYCOL 3350 17 G PO PACK: 17.0000 g | PACK | Freq: Every day | ORAL | Status: DC | PRN

## 2018-04-18 MED ORDER — POTASSIUM CHLORIDE CRYS ER 20 MEQ PO TBCR
20.0000 meq | EXTENDED_RELEASE_TABLET | Freq: Every day | ORAL | Status: DC
  Administered 2018-04-18 – 2018-04-19 (×2): 20 meq via ORAL
  Filled 2018-04-18 (×2): qty 1

## 2018-04-18 NOTE — H&P (Signed)
History and Physical    Debra Hodges YOV:[id number] DOB: 07-14-1950 DOA: 04/18/2018  PCP: System, Pcp Not In Patient coming from: Home  Chief Complaint: Nausea vomiting  HPI: Debra Hodges is a 67 y.o. 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  Patient received her chemo last week and had been feeling nauseous throughout the weekend along with infrequent nonbloody nonbilious vomiting.  She went to her cancer center today where she had routine lab work which showed absolute neutropenia.  In signs of dehydration and absolute neutropenia she was sent to the hospital for admission. In the ER she was noted to be very dehydrated and tachycardic.  She was started on IV fluids which improved her heart rate from 130s to 110.  It was sinus tachycardia.  She was saturating well and hemodynamically stable.  Lactate was elevated.  Her CBC showed 0.8 WBC with 0 neutrophils.  Oncology was consulted who recommended oral Cipro.  Medical team was requested to admit the patient.  When I saw the patient she was slightly nauseous but did not have any complaints.  She did appear clinically dry.  She reported of dry mouth and decreased urine output.   Review of Systems: As per HPI otherwise 10 point review of systems negative.  Review of Systems Otherwise negative except as per HPI, including: General: Denies fever, chills, night sweats or unintended weight loss. Resp: Denies cough, wheezing, shortness of breath. Cardiac: Denies chest pain, palpitations, orthopnea, paroxysmal nocturnal dyspnea. GI: Denies abdominal pain, diarrhea or constipation GU: Denies dysuria, frequency, hesitancy or incontinence MS: Denies muscle aches, joint pain or swelling Neuro: Denies headache, neurologic deficits (focal  weakness, numbness, tingling), abnormal gait Psych: Denies anxiety, depression, SI/HI/AVH Skin: Denies new rashes or lesions ID: Denies sick contacts, exotic exposures, travel  Past Medical History:  Diagnosis Date  . High blood pressure   . Liver lesion 06/26/2016  . Non-small cell carcinoma of lung, stage 4 ([place]) dx'd 10/2013    Past Surgical History:  Procedure Laterality Date  . ABDOMINAL HYSTERECTOMY    . BREAST BIOPSY     Right breast  . VIDEO BRONCHOSCOPY Bilateral 10/27/2013   Procedure: VIDEO BRONCHOSCOPY WITH FLUORO;  Surgeon: Collene Gobble, MD;  Location: [facility];  Service: Cardiopulmonary;  Laterality: Bilateral;    SOCIAL HISTORY:  reports that she has never smoked. She has never used smokeless tobacco. She reports that she does not drink alcohol or use drugs.  Allergies  Allergen Reactions  . Peanut-Containing Drug Products     Excessive mucous  . Penicillins     Hives, Childhood Allergy Has patient had a PCN reaction causing immediate rash, facial/tongue/throat swelling, SOB or lightheadedness with hypotension: Yes Has patient had a PCN reaction causing severe rash involving mucus membranes or skin necrosis: No Has patient had a PCN reaction that required hospitalization: No Has patient had a PCN reaction occurring within the last 10 years: No If all of the above answers are "NO", then may proceed with Cephalosporin use.     FAMILY HISTORY: Family History  Problem Relation Age of Onset  . High blood pressure Mother   . Cancer Mother        skin  . Cancer Father        esophageal  . Cancer Maternal Grandfather        breast  Prior to Admission medications   Medication Sig Start Date End Date Taking? Authorizing Provider  acetaminophen (TYLENOL) 325 MG tablet Take 650 mg by mouth every 6 (six) hours as needed for mild pain. Reported on 06/15/2015   Yes [provider]  apixaban (ELIQUIS) 5 MG TABS tablet Take 2 tablets (10 mg total) by  mouth 2 (two) times daily. 04/17/18  Yes Curt Bears, MD  calcium carbonate (OS-CAL) 600 MG TABS tablet Take 600 mg by mouth daily.    Yes [provider]  CLIMARA 0.1 MG/24HR patch Place 1 patch onto the skin once a week. On Thursday 08/12/13  Yes [provider]  levothyroxine (SYNTHROID, LEVOTHROID) 50 MCG tablet Take 50 mcg by mouth at bedtime.  11/13/16  Yes [provider]  loperamide (IMODIUM) 2 MG capsule Take 2 mg by mouth daily as needed for diarrhea or loose stools. Reported on 11/22/2015   Yes [provider]  loratadine (CLARITIN) 10 MG tablet Take 10 mg by mouth daily.   Yes [provider]  Multiple Vitamins-Calcium (ONE-A-DAY WOMENS PO) Take 1 tablet by mouth daily.   Yes [provider]  potassium chloride SA (K-DUR,KLOR-CON) 20 MEQ tablet Take 20 mEq by mouth at bedtime.  07/22/13  Yes [provider]  prochlorperazine (COMPAZINE) 10 MG tablet TAKE 1 TABLET(10 MG) BY MOUTH EVERY 6 HOURS AS NEEDED FOR NAUSEA OR VOMITING Patient taking differently: Take 10 mg by mouth every 6 (six) hours as needed for nausea or vomiting.  02/11/18  Yes Curt Bears, MD  Sodium Fluoride (CLINPRO 5000) 1.1 % PSTE Place 1 application onto teeth 2 (two) times daily.    Yes [provider]  spironolactone (ALDACTONE) 25 MG tablet Take 25 mg by mouth daily.  10/02/13  Yes [provider]  vitamin C (ASCORBIC ACID) 500 MG tablet Take 500 mg by mouth daily.   Yes [provider]  oxyCODONE-acetaminophen (PERCOCET/ROXICET) 5-325 MG tablet Take 1 tablet by mouth every 6 (six) hours as needed for severe pain. Patient not taking: Reported on 04/18/2018 02/11/18   Curt Bears, MD    Physical Exam: Vitals:   04/18/18 1330 04/18/18 1500 04/18/18 1512 04/18/18 1554  BP: (!) 155/75 (!) 155/69 (!) 155/69 (!) 151/66  Pulse: (!) 104 (!) 106 (!) 110 (!) 104  Resp: 16 (!) 6 19 (!) 23  Temp:      TempSrc:      SpO2: 100%  98% 100% 100%  Weight:      Height:          Constitutional: NAD, calm, comfortable Eyes: PERRL, lids and conjunctivae normal ENMT: Mucous membranes are dry posterior pharynx clear of any exudate or lesions.Normal dentition.  Neck: normal, supple, no masses, no thyromegaly Respiratory: clear to auscultation bilaterally, no wheezing, no crackles. Normal respiratory effort. No accessory muscle use.  Cardiovascular: Regular rate and rhythm, no murmurs / rubs / gallops. No extremity edema. 2+ pedal pulses. No carotid bruits.  Abdomen: no tenderness, no masses palpated. No hepatosplenomegaly. Bowel sounds positive.  Musculoskeletal: no clubbing / cyanosis. No joint deformity upper and lower extremities. Good ROM, no contractures. Normal muscle tone.  Skin: no rashes, lesions, ulcers. No induration Neurologic: CN 2-12 grossly intact. Sensation intact, DTR normal. Strength 5/5 in all 4.  Psychiatric: Normal judgment and insight. Alert and oriented x 3. Normal mood.     Labs on Admission: I have personally reviewed following labs and imaging studies  CBC: Recent Labs  Lab 04/18/18  3267 04/18/18 1222  WBC 1.0* 0.8*  NEUTROABS 0.0* 0.0*  HGB 9.9* 9.9*  HCT 28.6* 29.5*  MCV 92.0 93.1  PLT 50* 48*   Basic Metabolic Panel: Recent Labs  Lab 04/18/18 0817 04/18/18 1222  NA 138 134*  K 4.0 3.2*  CL 101 100  CO2 26 25  GLUCOSE 94 127*  BUN 14 18  CREATININE 0.64 0.52  CALCIUM 9.4 9.1   GFR: Estimated Creatinine Clearance: 78.4 mL/min (by C-G formula based on SCr of 0.52 mg/dL). Liver Function Tests: Recent Labs  Lab 04/18/18 0817 04/18/18 1222  AST 31 37  ALT 34 33  ALKPHOS 147* 132*  BILITOT 1.0 1.1  PROT 7.1 7.2  ALBUMIN 3.3* 3.7   No results for input(s): LIPASE, AMYLASE in the last 168 hours. No results for input(s): AMMONIA in the last 168 hours. Coagulation Profile: No results for input(s): INR, PROTIME in the last 168 hours. Cardiac Enzymes: No results for  input(s): CKTOTAL, CKMB, CKMBINDEX, TROPONINI in the last 168 hours. BNP (last 3 results) No results for input(s): PROBNP in the last 8760 hours. HbA1C: No results for input(s): HGBA1C in the last 72 hours. CBG: No results for input(s): GLUCAP in the last 168 hours. Lipid Profile: No results for input(s): CHOL, HDL, LDLCALC, TRIG, CHOLHDL, LDLDIRECT in the last 72 hours. Thyroid Function Tests: No results for input(s): TSH, T4TOTAL, FREET4, T3FREE, THYROIDAB in the last 72 hours. Anemia Panel: No results for input(s): VITAMINB12, FOLATE, FERRITIN, TIBC, IRON, RETICCTPCT in the last 72 hours. Urine analysis:    Component Value Date/Time   COLORURINE YELLOW 04/18/2018 [place] 04/18/2018 1438   LABSPEC 1.010 04/18/2018 1438   PHURINE 7.0 04/18/2018 1438   GLUCOSEU NEGATIVE 04/18/2018 1438   HGBUR NEGATIVE 04/18/2018 1438   [place] 04/18/2018 1438   KETONESUR NEGATIVE 04/18/2018 1438   PROTEINUR NEGATIVE 04/18/2018 1438   NITRITE NEGATIVE 04/18/2018 1438   LEUKOCYTESUR NEGATIVE 04/18/2018 1438   Sepsis Labs: !!!!!!!!!!!!!!!!!!!!!!!!!!!!!!!!!!!!!!!!!!!! @LABRCNTIP (procalcitonin:4,lacticidven:4) )No results found for this or any previous visit (from the past 240 hour(s)).   Radiological Exams on Admission: Dg Chest 2 View  Result Date: 04/18/2018 CLINICAL DATA:  Low white blood count. Cough. History of lung cancer EXAM: CHEST - 2 VIEW COMPARISON:  03/06/2018 chest CT FINDINGS: Left perihilar opacity correlating with airspace and nodular densities on 03/06/2018 chest CT. The right lung is clear although low volume. Normal heart size and mediastinal contours. There is a sclerotic focus in the left proximal humeral metaphysis that is likely chondroid based on comparison chest CT. IMPRESSION: Left perihilar opacity without convincing change from 03/06/2018 chest CT-please reference that report. Superimposed pneumonia may be obscured. Electronically Signed    By: Monte Fantasia M.D.   On: 04/18/2018 14:07     All images have been reviewed by me personally.  EKG: Independently reviewed.  Sinus tachycardia  Assessment/Plan Active Problems:   Malignant neoplasm of upper lobe of left lung (HCC)   Non-small cell carcinoma of lung, stage 4 (HCC)   Metastases to the liver (HCC)   Acute pulmonary embolism (HCC)   Hypothyroidism   Nausea and vomiting   Neutropenia (HCC)    Nausea and vomiting with severe dehydrationAbsolute neutropenia -Admit the patient to the hospital.  We will draw cultures, trend lactate.  UA is negative.  Empirically start patient on IV Cipro per oncology recommendations.  IV Cipro while she is nauseous and has poor tolerance to p.o. medications. -Provide supportive care, monitor urine  output -Aggressive hydration -Pain control as needed. -Neutropenic precaution in place  History of pulmonary embolism and DVT -Continue Eliquis  Stage IV lung cancer with metastases to the liver -Follow-up outpatient with oncology.  Currently she is on chemotherapy.  Hypothyroidism -Continue Synthroid  DVT prophylaxis: Eliquis Code Status: Full code  Family Communication: Husband Disposition Plan: To be determined Consults called: Oncology, Dr. Alen Blew Admission status: Inpatient admission to telemetry   Time Spent: 65 minutes.  >50% of the time was devoted to discussing the patients care, assessment, plan and disposition with other care givers along with counseling the patient about the risks and benefits of treatment.    Trameka Dorough Arsenio Loader MD [facility] Pager [phone]  If 7PM-7AM, please contact night-coverage [email or web address] Password [facility]  04/18/2018, 4:33 PM

## 2018-04-18 NOTE — ED Provider Notes (Signed)
[place] DEPT Provider Note   CSN: [id number] Arrival date & time: 04/18/18  1131     History   Chief Complaint Chief Complaint  Patient presents with  . Nausea  . Nasal Congestion    HPI Debra Hodges is a 67 y.o. female.  Pt presents to the ED today with weakness and nausea.  Pt has a hx of stage IV lung cancer.  She had an infusion of Avastin, Tecentriq, Paclitaxel, and Carboplatin on 11/14.  She had an infusion of Neulasta on 11/16.  She has not been feeling well since then.  The nausea meds are not helping and she has been unable to eat or drink much.  Blood work done today at the cancer center showed an [place] of 0.0.  When Dr. Worthy Flank office called her to tell her about results; she told them she felt really terrible and was told to come to the ED.  She denies any specific pain, but hurts all over.  No fever at home.  She was diagnosed with a PE last month and has been taking her Eliquis.     Past Medical History:  Diagnosis Date  . High blood pressure   . Liver lesion 06/26/2016  . Non-small cell carcinoma of lung, stage 4 ([place]) dx'd 10/2013    Patient Active Problem List   Diagnosis Date Noted  . Acute pulmonary embolism ([place]) 03/07/2018  . Hypothyroidism 03/07/2018  . Non-small cell lung cancer with metastasis ([place])   . Goals of care, counseling/discussion 02/11/2018  . Encounter for antineoplastic immunotherapy 02/11/2018  . Metastases to the liver ([place]) 01/24/2017  . Liver lesion 06/26/2016  . Non-small cell carcinoma of lung, stage 4 ([place])   . Vaginal lesion 01/06/2015  . Encounter for antineoplastic chemotherapy 01/06/2015  . Malignant neoplasm of upper lobe of left lung ([place]) 11/13/2013  . Lung mass 10/16/2013    Past Surgical History:  Procedure Laterality Date  . ABDOMINAL HYSTERECTOMY    . BREAST BIOPSY     Right breast  . VIDEO BRONCHOSCOPY Bilateral 10/27/2013   Procedure: VIDEO BRONCHOSCOPY WITH FLUORO;   Surgeon: Collene Gobble, MD;  Location: [facility];  Service: Cardiopulmonary;  Laterality: Bilateral;     OB History   None      Home Medications    Prior to Admission medications   Medication Sig Start Date End Date Taking? Authorizing Provider  acetaminophen (TYLENOL) 325 MG tablet Take 650 mg by mouth every 6 (six) hours as needed for mild pain. Reported on 06/15/2015   Yes [provider]  apixaban (ELIQUIS) 5 MG TABS tablet Take 2 tablets (10 mg total) by mouth 2 (two) times daily. 04/17/18  Yes Curt Bears, MD  calcium carbonate (OS-CAL) 600 MG TABS tablet Take 600 mg by mouth daily.    Yes [provider]  CLIMARA 0.1 MG/24HR patch Place 1 patch onto the skin once a week. On Thursday 08/12/13  Yes [provider]  levothyroxine (SYNTHROID, LEVOTHROID) 50 MCG tablet Take 50 mcg by mouth at bedtime.  11/13/16  Yes [provider]  loperamide (IMODIUM) 2 MG capsule Take 2 mg by mouth daily as needed for diarrhea or loose stools. Reported on 11/22/2015   Yes [provider]  loratadine (CLARITIN) 10 MG tablet Take 10 mg by mouth daily.   Yes [provider]  Multiple Vitamins-Calcium (ONE-A-DAY WOMENS PO) Take 1 tablet by mouth daily.   Yes [provider]  potassium chloride SA (  K-DUR,KLOR-CON) 20 MEQ tablet Take 20 mEq by mouth at bedtime.  07/22/13  Yes [provider]  prochlorperazine (COMPAZINE) 10 MG tablet TAKE 1 TABLET(10 MG) BY MOUTH EVERY 6 HOURS AS NEEDED FOR NAUSEA OR VOMITING Patient taking differently: Take 10 mg by mouth every 6 (six) hours as needed for nausea or vomiting.  02/11/18  Yes Curt Bears, MD  Sodium Fluoride (CLINPRO 5000) 1.1 % PSTE Place 1 application onto teeth 2 (two) times daily.    Yes [provider]  spironolactone (ALDACTONE) 25 MG tablet Take 25 mg by mouth daily.  10/02/13  Yes [provider]  vitamin C (ASCORBIC ACID) 500 MG tablet Take 500 mg by mouth  daily.   Yes [provider]  oxyCODONE-acetaminophen (PERCOCET/ROXICET) 5-325 MG tablet Take 1 tablet by mouth every 6 (six) hours as needed for severe pain. Patient not taking: Reported on 04/18/2018 02/11/18   Curt Bears, MD    Family History Family History  Problem Relation Age of Onset  . High blood pressure Mother   . Cancer Mother        skin  . Cancer Father        esophageal  . Cancer Maternal Grandfather        breast    Social History Social History   Tobacco Use  . Smoking status: Never Smoker  . Smokeless tobacco: Never Used  Substance Use Topics  . Alcohol use: No    Comment: social  . Drug use: No     Allergies   Peanut-containing drug products and Penicillins   Review of Systems Review of Systems  Gastrointestinal: Positive for nausea.  Neurological: Positive for weakness.  All other systems reviewed and are negative.    Physical Exam Updated Vital Signs BP (!) 155/69   Pulse (!) 110   Temp 97.9 F (36.6 C) (Oral)   Resp 19   Ht 5\' 6"  (1.676 m)   Wt 93 kg   SpO2 100%   BMI 33.09 kg/m   Physical Exam  Constitutional: She is oriented to person, place, and time. She appears well-developed and well-nourished. She has a sickly appearance.  HENT:  Head: Normocephalic and atraumatic.  Right Ear: External ear normal.  Left Ear: External ear normal.  Nose: Nose normal.  Mouth/Throat: Mucous membranes are dry.  Eyes: Pupils are equal, round, and reactive to light. Conjunctivae and EOM are normal.  Neck: Normal range of motion. Neck supple.  Cardiovascular: Regular rhythm. Tachycardia present.  Pulmonary/Chest: Effort normal and breath sounds normal.  Abdominal: Soft. Bowel sounds are normal.  Musculoskeletal: Normal range of motion.  Neurological: She is alert and oriented to person, place, and time.  Skin: Skin is warm. Capillary refill takes less than 2 seconds.  Psychiatric: She has a normal mood and affect. Her behavior is  normal. Judgment and thought content normal.  Nursing note and vitals reviewed.    ED Treatments / Results  Labs (all labs ordered are listed, but only abnormal results are displayed) Labs Reviewed  COMPREHENSIVE METABOLIC PANEL - Abnormal; Notable for the following components:      Result Value   Sodium 134 (*)    Potassium 3.2 (*)    Glucose, Bld 127 (*)    Alkaline Phosphatase 132 (*)    All other components within normal limits  CBC WITH DIFFERENTIAL/PLATELET - Abnormal; Notable for the following components:   WBC 0.8 (*)    RBC 3.17 (*)    Hemoglobin 9.9 (*)  HCT 29.5 (*)    Platelets 48 (*)    Neutro Abs 0.0 (*)    Lymphs Abs 0.5 (*)    All other components within normal limits  I-STAT CG4 LACTIC ACID, ED - Abnormal; Notable for the following components:   Lactic Acid, Venous 2.17 (*)    All other components within normal limits  URINE CULTURE  CULTURE, BLOOD (ROUTINE X 2)  CULTURE, BLOOD (ROUTINE X 2)  URINALYSIS, ROUTINE W REFLEX MICROSCOPIC  I-STAT CG4 LACTIC ACID, ED    EKG EKG Interpretation  Date/Time:  Thursday April 18 2018 12:10:42 EST Ventricular Rate:  109 PR Interval:    QRS Duration: 94 QT Interval:  354 QTC Calculation: 477 R Axis:   -43 Text Interpretation:  Sinus tachycardia Ventricular premature complex Left anterior fascicular block Abnormal R-wave progression, early transition LVH by voltage Anterior Q waves, possibly due to LVH ST elevation, consider inferior injury No significant change since last tracing Confirmed by Isla Pence [phone]) on 04/18/2018 12:16:49 PM   Radiology Dg Chest 2 View  Result Date: 04/18/2018 CLINICAL DATA:  Low white blood count. Cough. History of lung cancer EXAM: CHEST - 2 VIEW COMPARISON:  03/06/2018 chest CT FINDINGS: Left perihilar opacity correlating with airspace and nodular densities on 03/06/2018 chest CT. The right lung is clear although low volume. Normal heart size and mediastinal contours.  There is a sclerotic focus in the left proximal humeral metaphysis that is likely chondroid based on comparison chest CT. IMPRESSION: Left perihilar opacity without convincing change from 03/06/2018 chest CT-please reference that report. Superimposed pneumonia may be obscured. Electronically Signed   By: Monte Fantasia M.D.   On: 04/18/2018 14:07    Procedures Procedures (including critical care time)  Medications Ordered in ED Medications  sodium chloride 0.9 % bolus 1,000 mL (1,000 mLs Intravenous Bolus 04/18/18 1509)  ciprofloxacin (CIPRO) tablet 500 mg (has no administration in time range)  sodium chloride 0.9 % bolus 1,000 mL (1,000 mLs Intravenous Bolus 04/18/18 1242)  ondansetron (ZOFRAN) injection 4 mg (4 mg Intravenous Given 04/18/18 1242)     Initial Impression / Assessment and Plan / ED Course  I have reviewed the triage vital signs and the nursing notes.  Pertinent labs & imaging results that were available during my care of the patient were reviewed by me and considered in my medical decision making (see chart for details).    Pt is feeling a little better after IVFs, but is still nauseous.   UA is pending at admission.   Pt d/w Dr. Alen Blew (oncology) who recommended oral cipro while in the hospital and IV hydration overnight.  Pt d/w Dr. Reesa Chew ([facility]) for admission.  Final Clinical Impressions(s) / ED Diagnoses   Final diagnoses:  Dehydration  Neutropenia, unspecified type (HCC)  Nausea  Non-small cell carcinoma of lung, stage 4, unspecified laterality [facility])    ED Discharge Orders    None       Isla Pence, MD 04/18/18 1550

## 2018-04-18 NOTE — ED Notes (Signed)
ED TO INPATIENT HANDOFF REPORT  Name/Age/Gender Debra Hodges 67 y.o. female  Code Status    Code Status Orders  (From admission, onward)         Start     Ordered   04/18/18 1553  Full code  Continuous     04/18/18 1555        Code Status History    Date Active Date Inactive Code Status Order ID Comments User Context   03/07/2018 0013 03/09/2018 1508 Full Code [id number]  Vianne Bulls, MD ED      Home/SNF/Other Home  Chief Complaint Chemo/Immunotherapy Card; Balance Issues  Level of Care/Admitting Diagnosis ED Disposition    ED Disposition Condition [facility] Area: [facility] [[id number]]  Level of Care: Telemetry [5]  Admit to tele based on following criteria: Other see comments  Comments: sinus tachycardia  Diagnosis: Nausea and vomiting [034742]  Admitting Physician: Gerlean Ren [facility] [[id number]]  Attending Physician: Gerlean Ren [facility] [[id number]]  Estimated length of stay: past midnight tomorrow  Certification:: I certify this patient will need inpatient services for at least 2 midnights  PT Class (Do Not Modify): Inpatient [101]  PT Acc Code (Do Not Modify): Private [1]       Medical History Past Medical History:  Diagnosis Date  . High blood pressure   . Liver lesion 06/26/2016  . Non-small cell carcinoma of lung, stage 4 ([place]) dx'd 10/2013    Allergies Allergies  Allergen Reactions  . Peanut-Containing Drug Products     Excessive mucous  . Penicillins     Hives, Childhood Allergy Has patient had a PCN reaction causing immediate rash, facial/tongue/throat swelling, SOB or lightheadedness with hypotension: Yes Has patient had a PCN reaction causing severe rash involving mucus membranes or skin necrosis: No Has patient had a PCN reaction that required hospitalization: No Has patient had a PCN reaction occurring within the last 10 years: No If all of the above answers are "NO", then may proceed with  Cephalosporin use.     IV Location/Drains/Wounds Patient Lines/Drains/Airways Status   Active Line/Drains/Airways    Name:   Placement date:   Placement time:   Site:   Days:   Peripheral IV 04/18/18 Left Forearm   04/18/18    1224    Forearm   less than 1          Labs/Imaging Results for orders placed or performed during the hospital encounter of 04/18/18 (from the past 48 hour(s))  Comprehensive metabolic panel     Status: Abnormal   Collection Time: 04/18/18 12:22 PM  Result Value Ref Range   Sodium 134 (L) 135 - 145 mmol/L   Potassium 3.2 (L) 3.5 - 5.1 mmol/L   Chloride 100 98 - 111 mmol/L   CO2 25 22 - 32 mmol/L   Glucose, Bld 127 (H) 70 - 99 mg/dL   BUN 18 8 - 23 mg/dL   Creatinine, Ser 0.52 0.44 - 1.00 mg/dL   Calcium 9.1 8.9 - 10.3 mg/dL   Total Protein 7.2 6.5 - 8.1 g/dL   Albumin 3.7 3.5 - 5.0 g/dL   AST 37 15 - 41 U/L   ALT 33 0 - 44 U/L   Alkaline Phosphatase 132 (H) 38 - 126 U/L   Total Bilirubin 1.1 0.3 - 1.2 mg/dL   GFR calc non Af Amer >60 >60 mL/min   GFR calc Af Amer >60 >60 mL/min    Comment: (NOTE) The eGFR  has been calculated using the CKD EPI equation. This calculation has not been validated in all clinical situations. eGFR's persistently <60 mL/min signify possible Chronic Kidney Disease.    Anion gap 9 5 - 15    Comment: Performed at [facility], [place] [address] [place], [place] [address]  CBC with Differential     Status: Abnormal   Collection Time: 04/18/18 12:22 PM  Result Value Ref Range   WBC 0.8 (LL) 4.0 - 10.5 K/uL    Comment: This critical result has verified and been called to EVANS, J. by POTEAT,SHANNON on 11 21 2019 at 1317, and has been read back. CRITICAL RESULT VERIFIED   RBC 3.17 (L) 3.87 - 5.11 MIL/uL   Hemoglobin 9.9 (L) 12.0 - 15.0 g/dL   HCT 29.5 (L) 36.0 - 46.0 %   MCV 93.1 80.0 - 100.0 fL   MCH 31.2 26.0 - 34.0 pg   MCHC 33.6 30.0 - 36.0 g/dL   RDW 15.2 11.5 - 15.5 %   Platelets 48 (L) 150 - 400  K/uL    Comment: Immature Platelet Fraction may be clinically indicated, consider ordering this additional test [id number]    nRBC 0.0 0.0 - 0.2 %   Neutrophils Relative % 2 %   Neutro Abs 0.0 (L) 1.7 - 7.7 K/uL   Lymphocytes Relative 59 %   Lymphs Abs 0.5 (L) 0.7 - 4.0 K/uL   Monocytes Relative 37 %   Monocytes Absolute 0.3 0.1 - 1.0 K/uL   Eosinophils Relative 1 %   Eosinophils Absolute 0.0 0.0 - 0.5 K/uL   Basophils Relative 0 %   Basophils Absolute 0.0 0.0 - 0.1 K/uL   Immature Granulocytes 1 %   Abs Immature Granulocytes 0.01 0.00 - 0.07 K/uL    Comment: Performed at [facility], [place] [address] [place], [place] [address]  I-Stat CG4 Lactic Acid, ED     Status: Abnormal   Collection Time: 04/18/18 12:34 PM  Result Value Ref Range   Lactic Acid, Venous 2.17 (HH) 0.5 - 1.9 mmol/L   Comment NOTIFIED PHYSICIAN   Urinalysis, Routine w reflex microscopic     Status: None   Collection Time: 04/18/18  2:38 PM  Result Value Ref Range   Color, Urine YELLOW YELLOW   APPearance CLEAR CLEAR   Specific Gravity, Urine 1.010 1.005 - 1.030   pH 7.0 5.0 - 8.0   Glucose, UA NEGATIVE NEGATIVE mg/dL   Hgb urine dipstick NEGATIVE NEGATIVE   Bilirubin Urine NEGATIVE NEGATIVE   Ketones, ur NEGATIVE NEGATIVE mg/dL   Protein, ur NEGATIVE NEGATIVE mg/dL   Nitrite NEGATIVE NEGATIVE   Leukocytes, UA NEGATIVE NEGATIVE    Comment: Performed at [place] [address], [place],  [address]  I-Stat CG4 Lactic Acid, ED     Status: None   Collection Time: 04/18/18  4:04 PM  Result Value Ref Range   Lactic Acid, Venous 1.27 0.5 - 1.9 mmol/L   Dg Chest 2 View  Result Date: 04/18/2018 CLINICAL DATA:  Low white blood count. Cough. History of lung cancer EXAM: CHEST - 2 VIEW COMPARISON:  03/06/2018 chest CT FINDINGS: Left perihilar opacity correlating with airspace and nodular densities on 03/06/2018 chest CT. The right lung is clear although low volume.  Normal heart size and mediastinal contours. There is a sclerotic focus in the left proximal humeral metaphysis that is likely chondroid based on comparison chest CT. IMPRESSION: Left perihilar opacity without convincing change from  03/06/2018 chest CT-please reference that report. Superimposed pneumonia may be obscured. Electronically Signed   By: Monte Fantasia M.D.   On: 04/18/2018 14:07   EKG Interpretation  Date/Time:  Thursday April 18 2018 12:10:42 EST Ventricular Rate:  109 PR Interval:    QRS Duration: 94 QT Interval:  354 QTC Calculation: 477 R Axis:   -43 Text Interpretation:  Sinus tachycardia Ventricular premature complex Left anterior fascicular block Abnormal R-wave progression, early transition LVH by voltage Anterior Q waves, possibly due to LVH ST elevation, consider inferior injury No significant change since last tracing Confirmed by Isla Pence [phone]) on 04/18/2018 12:16:49 PM   Pending Labs Unresulted Labs (From admission, onward)    Start     Ordered   04/19/18 0500  Magnesium  Tomorrow morning,   R     04/18/18 1555   04/19/18 0500  Comprehensive metabolic panel  Tomorrow morning,   R     04/18/18 1555   04/19/18 0500  CBC WITH DIFFERENTIAL  Tomorrow morning,   R     04/18/18 1555   04/18/18 1207  Urine culture  ONCE - STAT,   STAT     04/18/18 1207   04/18/18 1207  Culture, blood (routine x 2)  BLOOD CULTURE X 2,   STAT     04/18/18 1207          Vitals/Pain Today's Vitals   04/18/18 1512 04/18/18 1554 04/18/18 1600 04/18/18 1630  BP: (!) 155/69 (!) 151/66 (!) 151/65 (!) 152/64  Pulse: (!) 110 (!) 104 (!) 108 (!) 103  Resp: 19 (!) _0 Temp:      TempSrc:      SpO2: 100% 100% 98% 100%  Weight:      Height:      PainSc:        Isolation Precautions Protective Precautions  Medications Medications  spironolactone (ALDACTONE) tablet 25 mg (has no administration in time range)  estradiol (CLIMARA - Dosed in mg/24 hr) patch 0.1 mg  (has no administration in time range)  potassium chloride SA (K-DUR,KLOR-CON) CR tablet 20 mEq (has no administration in time range)  calcium carbonate (OS-CAL) tablet 600 mg (has no administration in time range)  vitamin C (ASCORBIC ACID) tablet 500 mg (has no administration in time range)  loperamide (IMODIUM) capsule 2 mg (has no administration in time range)  Sodium Fluoride 1.1 % PSTE 1 application (has no administration in time range)  levothyroxine (SYNTHROID, LEVOTHROID) tablet 50 mcg (has no administration in time range)  loratadine (CLARITIN) tablet 10 mg (has no administration in time range)  apixaban (ELIQUIS) tablet 10 mg (has no administration in time range)  0.9 %  sodium chloride infusion (has no administration in time range)  acetaminophen (TYLENOL) tablet 650 mg (has no administration in time range)    Or  acetaminophen (TYLENOL) suppository 650 mg (has no administration in time range)  oxyCODONE (Oxy IR/ROXICODONE) immediate release tablet 5 mg (has no administration in time range)  polyethylene glycol (MIRALAX / GLYCOLAX) packet 17 g (has no administration in time range)  bisacodyl (DULCOLAX) EC tablet 5 mg (has no administration in time range)  sodium chloride 0.9 % bolus 1,000 mL (1,000 mLs Intravenous Bolus 04/18/18 1242)  ondansetron (ZOFRAN) injection 4 mg (4 mg Intravenous Given 04/18/18 1242)  sodium chloride 0.9 % bolus 1,000 mL (1,000 mLs Intravenous Bolus 04/18/18 1509)    Mobility walks with device

## 2018-04-18 NOTE — Telephone Encounter (Signed)
Labs today -Neutropenia- LVM to call re low blood counts. ANC-0.0 and Dr Julien Nordmann ordered Cipro to cover for neutropenia. Pt update-Debra Hodges called back -She  feels "awful ,tripping over her feet , nauseated". Her voice is weak.   Per Dr Julien Nordmann I instructed pt to go directly to ED.   11/14-chemo /immunotherapy -Taxol ,carbo .avastin, Tecentriq. 11/16- Neulasta

## 2018-04-18 NOTE — ED Notes (Signed)
CRITICAL VALUE STICKER  CRITICAL VALUE: WBC  RECEIVER (on-site recipient of call): AEVANS,RN  DATE & TIME NOTIFIED: 04/18/18 1319  MESSENGER (representative from lab): NICOLE  MD NOTIFIEDGilford Raid MD  TIME OF NOTIFICATION: 04/18/18 1320  RESPONSE: SEE NEW ORDERS

## 2018-04-18 NOTE — ED Notes (Signed)
Asked phlebotomy to obtain Lactic due to unsuccessful attempt by Nurse and Tech.

## 2018-04-18 NOTE — ED Notes (Signed)
Attempted blood draw, unsuccessful x 1.

## 2018-04-18 NOTE — ED Notes (Signed)
MD AND RN NOTIFIED OF PATIENT'S LACTIC ACID LEVEL OF 2.17

## 2018-04-18 NOTE — ED Triage Notes (Signed)
Patient reports she was sent by Dr. Inda Merlin for low WBC after lab draw today. Reports nausea and nasal congestion. Hx lung cancer. Last chemo 11/13.

## 2018-04-19 DIAGNOSIS — E860 Dehydration: Secondary | ICD-10-CM

## 2018-04-19 LAB — COMPREHENSIVE METABOLIC PANEL
ALK PHOS: 107 U/L (ref 38–126)
ALT: 27 U/L (ref 0–44)
ANION GAP: 8 (ref 5–15)
AST: 28 U/L (ref 15–41)
Albumin: 3.2 g/dL — ABNORMAL LOW (ref 3.5–5.0)
BUN: 7 mg/dL — ABNORMAL LOW (ref 8–23)
CALCIUM: 7.8 mg/dL — AB (ref 8.9–10.3)
CO2: 23 mmol/L (ref 22–32)
Chloride: 103 mmol/L (ref 98–111)
Creatinine, Ser: 0.43 mg/dL — ABNORMAL LOW (ref 0.44–1.00)
GFR calc Af Amer: >60 mL/min (ref >60)
GFR calc non Af Amer: >60 mL/min (ref >60)
Glucose, Bld: 88 mg/dL (ref 70–99)
POTASSIUM: 3.2 mmol/L — AB (ref 3.5–5.1)
SODIUM: 134 mmol/L — AB (ref 135–145)
TOTAL PROTEIN: 6.1 g/dL — AB (ref 6.5–8.1)
Total Bilirubin: 1.1 mg/dL (ref 0.3–1.2)

## 2018-04-19 LAB — CBC WITH DIFFERENTIAL/PLATELET
Abs Immature Granulocytes: 0 10*3/uL (ref 0.00–0.07)
BASOS PCT: 0 %
Basophils Absolute: 0 10*3/uL (ref 0.0–0.1)
Eosinophils Absolute: 0 10*3/uL (ref 0.0–0.5)
Eosinophils Relative: 0 %
HCT: 25.2 % — ABNORMAL LOW (ref 36.0–46.0)
Hemoglobin: 8.5 g/dL — ABNORMAL LOW (ref 12.0–15.0)
LYMPHS ABS: 1.1 10*3/uL (ref 0.7–4.0)
Lymphocytes Relative: 64 %
MCH: 31.8 pg (ref 26.0–34.0)
MCHC: 33.7 g/dL (ref 30.0–36.0)
MCV: 94.4 fL (ref 80.0–100.0)
Monocytes Absolute: 0.4 10*3/uL (ref 0.1–1.0)
Monocytes Relative: 25 %
NEUTROS PCT: 11 %
Neutro Abs: 0.2 10*3/uL — ABNORMAL LOW (ref 1.7–7.7)
PLATELETS: 44 10*3/uL — AB (ref 150–400)
RBC: 2.67 MIL/uL — AB (ref 3.87–5.11)
RDW: 15.3 % (ref 11.5–15.5)
WBC: 1.7 10*3/uL — ABNORMAL LOW (ref 4.0–10.5)
nRBC: 0 % (ref 0.0–0.2)

## 2018-04-19 LAB — MAGNESIUM: Magnesium: 1.1 mg/dL — ABNORMAL LOW (ref 1.7–2.4)

## 2018-04-19 MED ORDER — POTASSIUM CHLORIDE IN NACL 40-0.9 MEQ/L-% IV SOLN
INTRAVENOUS | Status: DC
  Administered 2018-04-19 – 2018-04-20 (×3): 125 mL/h via INTRAVENOUS
  Filled 2018-04-19 (×4): qty 1000

## 2018-04-19 MED ORDER — SODIUM CHLORIDE 0.9 % IV BOLUS
500.0000 mL | Freq: Once | INTRAVENOUS | Status: AC
  Administered 2018-04-19: 500 mL via INTRAVENOUS

## 2018-04-19 MED ORDER — SODIUM CHLORIDE 0.9 % IV SOLN: INTRAVENOUS | Status: DC

## 2018-04-19 MED ORDER — OXYCODONE-ACETAMINOPHEN 5-325 MG PO TABS: 1.0000 | ORAL_TABLET | Freq: Four times a day (QID) | ORAL | Status: DC | PRN

## 2018-04-19 MED ORDER — MAGNESIUM OXIDE 400 (241.3 MG) MG PO TABS
400.0000 mg | ORAL_TABLET | Freq: Two times a day (BID) | ORAL | Status: AC
  Administered 2018-04-19 (×2): 400 mg via ORAL
  Filled 2018-04-19 (×2): qty 1

## 2018-04-19 NOTE — Progress Notes (Signed)
[facility] PROGRESS NOTE    Progress Note  Janaiah Vetrano  UXL:[id number] DOB: December 14, 1950 DOA: 04/18/2018 PCP: System, Pcp Not In     Brief Narrative:   Debra Hodges is an 67 y.o. 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 sent to the hospital for nausea vomiting and dehydration with abnormal lab work he is currently on light chemotherapy his last chemo treatment was last week and has been having nonbilious vomiting through the week by the cancer center today she had absolute neutropenia and signs of dehydration so she was sent to the hospital, she was found to be borderline low blood pressure with a heart rate of 130s  Assessment/Plan:   Severe nausea and vomiting with severe dehydration in the setting of absolute neutropenia: Culture data is pending, she was started empirically on IV Cipro, she has remained afebrile. He has been fluid resuscitated. We will continue supportive care, can discontinue cardiac monitoring. We will allow diet continue daily weight strict I's and O's. Check orthostatics  History of PE and DVT: Continue Eliquis.  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  Hypothyroidism: Continue Eliquis.     Estimated body mass index is 32.68 kg/m as calculated from the following:   Height as of this encounter: 5\' 6"  (1.676 m).   Weight as of this encounter: 91.9 kg.   DVT prophylaxis: lovenxo Family Communication:none Disposition Plan/Barrier to D/C: home in am Code Status:     Code Status Orders  (From admission, onward)         Start     Ordered   04/18/18 1553  Full code  Continuous     04/18/18 1555        Code Status History    Date Active Date Inactive Code Status Order ID Comments User Context   03/07/2018 0013 03/09/2018 1508 Full Code [id number]  Vianne Bulls, MD  ED        IV Access:    Peripheral IV   Procedures and diagnostic studies:   Dg Chest 2 View  Result Date: 04/18/2018 CLINICAL DATA:  Low white blood count. Cough. History of lung cancer EXAM: CHEST - 2 VIEW COMPARISON:  03/06/2018 chest CT FINDINGS: Left perihilar opacity correlating with airspace and nodular densities on 03/06/2018 chest CT. The right lung is clear although low volume. Normal heart size and mediastinal contours. There is a sclerotic focus in the left proximal humeral metaphysis that is likely chondroid based on comparison chest CT. IMPRESSION: Left perihilar opacity without convincing change from 03/06/2018 chest CT-please reference that report. Superimposed pneumonia may be obscured. Electronically Signed   By: Monte Fantasia M.D.   On: 04/18/2018 14:07     Medical Consultants:    None.  Anti-Infectives:   None  Subjective:    Amil Amen patient feels much better nausea has resolved, she would like to try a diet.  Objective:    Vitals:   04/18/18 1730 04/18/18 2242 04/19/18 0456 04/19/18 0538  BP: (!) 150/70 (!) 161/72 (!) 147/62   Pulse: (!) 106 (!) 116 (!) 104   Resp: 18 20 20    Temp: 98.9 F (37.2 C) 99.1 F (37.3 C) 99.2 F (37.3 C)   TempSrc: Oral Oral Oral   SpO2: 100% 98% 96%   Weight:    91.9 kg  Height:  Intake/Output Summary (Last 24 hours) at 04/19/2018 0747 Last data filed at 04/19/2018 0600 Gross per 24 hour  Intake 1495 ml  Output 1700 ml  Net -205 ml   Filed Weights   04/18/18 1138 04/19/18 0538  Weight: 93 kg 91.9 kg    Exam: General exam: In no acute distress. Respiratory system: Good air movement and clear to auscultation. Cardiovascular system: S1 & S2 heard, RRR. Gastrointestinal system: Abdomen is nondistended, soft and nontender.  Central nervous system: Alert and oriented. No focal neurological deficits. Extremities: No pedal edema. Skin: No rashes, lesions or ulcers Psychiatry: Judgement  and insight appear normal. Mood & affect appropriate.    Data Reviewed:    Labs: Basic Metabolic Panel: Recent Labs  Lab 04/18/18 0817 04/18/18 1222 04/19/18 0521  NA 138 134* 134*  K 4.0 3.2* 3.2*  CL 101 100 103  CO2 26 25 23   GLUCOSE 94 127* 88  BUN 14 18 7*  CREATININE 0.64 0.52 0.43*  CALCIUM 9.4 9.1 7.8*  MG  --   --  1.1*   GFR Estimated Creatinine Clearance: 77.9 mL/min (A) (by C-G formula based on SCr of 0.43 mg/dL (L)). Liver Function Tests: Recent Labs  Lab 04/18/18 0817 04/18/18 1222 04/19/18 0521  AST 31 37 28  ALT 34 33 27  ALKPHOS 147* 132* 107  BILITOT 1.0 1.1 1.1  PROT 7.1 7.2 6.1*  ALBUMIN 3.3* 3.7 3.2*   No results for input(s): LIPASE, AMYLASE in the last 168 hours. No results for input(s): AMMONIA in the last 168 hours. Coagulation profile No results for input(s): INR, PROTIME in the last 168 hours.  CBC: Recent Labs  Lab 04/18/18 0817 04/18/18 1222 04/19/18 0521  WBC 1.0* 0.8* 1.7*  NEUTROABS 0.0* 0.0* 0.2*  HGB 9.9* 9.9* 8.5*  HCT 28.6* 29.5* 25.2*  MCV 92.0 93.1 94.4  PLT 50* 48* 44*   Cardiac Enzymes: No results for input(s): CKTOTAL, CKMB, CKMBINDEX, TROPONINI in the last 168 hours. BNP (last 3 results) No results for input(s): PROBNP in the last 8760 hours. CBG: No results for input(s): GLUCAP in the last 168 hours. D-Dimer: No results for input(s): DDIMER in the last 72 hours. Hgb A1c: No results for input(s): HGBA1C in the last 72 hours. Lipid Profile: No results for input(s): CHOL, HDL, LDLCALC, TRIG, CHOLHDL, LDLDIRECT in the last 72 hours. Thyroid function studies: No results for input(s): TSH, T4TOTAL, T3FREE, THYROIDAB in the last 72 hours.  Invalid input(s): FREET3 Anemia work up: No results for input(s): VITAMINB12, FOLATE, FERRITIN, TIBC, IRON, RETICCTPCT in the last 72 hours. Sepsis Labs: Recent Labs  Lab 04/18/18 0817 04/18/18 1222 04/18/18 1234 04/18/18 1604 04/19/18 0521  WBC 1.0* 0.8*  --   --   1.7*  LATICACIDVEN  --   --  2.17* 1.27  --    Microbiology No results found for this or any previous visit (from the past 240 hour(s)).   Medications:   . apixaban  5 mg Oral BID  . calcium carbonate  625 mg Oral Q breakfast  . estradiol  0.1 mg Transdermal Weekly  . levothyroxine  50 mcg Oral QHS  . loratadine  10 mg Oral Daily  . potassium chloride SA  20 mEq Oral QHS  . Sodium Fluoride  1 application dental BID  . spironolactone  25 mg Oral Daily  . vitamin C  500 mg Oral Daily   Continuous Infusions: . 0.9 % NaCl with KCl 40 mEq / L  LOS: 1 day   Charlynne Cousins  [facility]   *Please refer to [email or web address], password [email or web address] to get updated schedule on who will round on this patient, as hospitalists switch teams weekly. If 7PM-7AM, please contact night-coverage at [email or web address], password [email or web address] for any overnight needs.  04/19/2018, 7:47 AM

## 2018-04-19 NOTE — Progress Notes (Signed)
CRITICAL VALUE ALERT  Critical Value:  Absolute Neutrophil 0.2  Date & Time Notied:  04/19/18 @ 5732  Provider Notified: Anibal Henderson. Amin  Orders Received/Actions taken: None at this time

## 2018-04-20 DIAGNOSIS — D702 Other drug-induced agranulocytosis: Secondary | ICD-10-CM

## 2018-04-20 DIAGNOSIS — T451X5S Adverse effect of antineoplastic and immunosuppressive drugs, sequela: Secondary | ICD-10-CM

## 2018-04-20 DIAGNOSIS — D709 Neutropenia, unspecified: Secondary | ICD-10-CM

## 2018-04-20 LAB — URINE CULTURE

## 2018-04-20 LAB — CBC
HCT: 25.9 % — ABNORMAL LOW (ref 36.0–46.0)
Hemoglobin: 8.3 g/dL — ABNORMAL LOW (ref 12.0–15.0)
MCH: 31.2 pg (ref 26.0–34.0)
MCHC: 32 g/dL (ref 30.0–36.0)
MCV: 97.4 fL (ref 80.0–100.0)
PLATELETS: 53 10*3/uL — AB (ref 150–400)
RBC: 2.66 MIL/uL — ABNORMAL LOW (ref 3.87–5.11)
RDW: 15.9 % — AB (ref 11.5–15.5)
WBC: 3.8 10*3/uL — ABNORMAL LOW (ref 4.0–10.5)
nRBC: 0.5 % — ABNORMAL HIGH (ref 0.0–0.2)

## 2018-04-20 NOTE — Progress Notes (Signed)
5mg  of oxy IR wasted with Margorie John, Rn.

## 2018-04-20 NOTE — Progress Notes (Signed)
Went over discharge papers with patient and family. All questions answered.

## 2018-04-20 NOTE — Discharge Summary (Addendum)
Physician Discharge Summary  Debra Hodges XBD:[id number] DOB: 08/05/1950 DOA: 04/18/2018  PCP: System, Pcp Not In  Admit date: 04/18/2018 Discharge date: 04/20/2018  Admitted From: home Disposition:  Home  Recommendations for Outpatient Follow-up:  1. Follow up with Oncology in 1-2 weeks   Home Health:No Equipment/Devices:none  Discharge Condition:stable CODE STATUS:full Diet recommendation: Heart Healthy   Brief/Interim Summary: 67 y.o. 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 sent to the hospital for nausea vomiting and dehydration with abnormal lab work he is currently on light chemotherapy his last chemo treatment was last week and has been having nonbilious vomiting through the week by the cancer center today she had absolute neutropenia and signs of dehydration so she was sent to the hospital, she was found to be borderline low blood pressure with a heart rate of 130s  Discharge Diagnoses:  Active Problems:   Malignant neoplasm of upper lobe of left lung (HCC)   Non-small cell carcinoma of lung, stage 4 (HCC)   Metastases to the liver ([facility])   Acute pulmonary embolism (HCC)   Hypothyroidism   Nausea and vomiting   Neutropenia (HCC)   Neutropenia due to and not concurrent with chemotherapy (HCC)  Severe nausea vomiting with severe dehydration in the setting of absolute neutropenia due to chemotherapy: Culture data was negative, she was treated conservatively with IV fluids her leukocytosis improved her thrombocytosis improved. Orthostatics were negative. Is having regular bowel movements. This could have been all likely due to chemotherapy.  History of PE and DVT: No changes were made to her medication.  Stage IV malignant neoplasm of the upper lung with liver metastases: Follow-up with oncology as an outpatient.  Discharge Instructions  Discharge Instructions    Diet - low sodium  heart healthy   Complete by:  As directed    Increase activity slowly   Complete by:  As directed      Allergies as of 04/20/2018      Reactions   Peanut-containing Drug Products    Excessive mucous   Penicillins    Hives, Childhood Allergy Has patient had a PCN reaction causing immediate rash, facial/tongue/throat swelling, SOB or lightheadedness with hypotension: Yes Has patient had a PCN reaction causing severe rash involving mucus membranes or skin necrosis: No Has patient had a PCN reaction that required hospitalization: No Has patient had a PCN reaction occurring within the last 10 years: No If all of the above answers are "NO", then may proceed with Cephalosporin use.      Medication List    TAKE these medications   acetaminophen 325 MG tablet Commonly known as:  TYLENOL Take 650 mg by mouth every 6 (six) hours as needed for mild pain. Reported on 06/15/2015   apixaban 5 MG Tabs tablet Commonly known as:  ELIQUIS Take 2 tablets (10 mg total) by mouth 2 (two) times daily. What changed:  how much to take   calcium carbonate 600 MG Tabs tablet Commonly known as:  OS-CAL Take 600 mg by mouth daily.   CLIMARA 0.1 mg/24hr patch Generic drug:  estradiol Place 1 patch onto the skin once a week. On Thursday   CLINPRO 5000 1.1 % Pste Generic drug:  Sodium Fluoride Place 1 application onto teeth 2 (two) times daily.   levothyroxine 50 MCG tablet Commonly known as:  SYNTHROID, LEVOTHROID Take 50 mcg by mouth at bedtime.   loperamide 2 MG capsule Commonly known as:  IMODIUM Take 2 mg by mouth daily as needed for diarrhea or loose stools. Reported on 11/22/2015   loratadine 10 MG tablet Commonly known as:  CLARITIN Take 10 mg by mouth daily.   ONE-A-DAY WOMENS PO Take 1 tablet by mouth daily.   oxyCODONE-acetaminophen 5-325 MG tablet Commonly known as:  PERCOCET/ROXICET Take 1 tablet by mouth every 6 (six) hours as needed for severe pain.   potassium chloride SA  20 MEQ tablet Commonly known as:  K-DUR,KLOR-CON Take 20 mEq by mouth at bedtime.   prochlorperazine 10 MG tablet Commonly known as:  COMPAZINE TAKE 1 TABLET(10 MG) BY MOUTH EVERY 6 HOURS AS NEEDED FOR NAUSEA OR VOMITING What changed:  See the new instructions.   spironolactone 25 MG tablet Commonly known as:  ALDACTONE Take 25 mg by mouth daily.   vitamin C 500 MG tablet Commonly known as:  ASCORBIC ACID Take 500 mg by mouth daily.       Allergies  Allergen Reactions  . Peanut-Containing Drug Products     Excessive mucous  . Penicillins     Hives, Childhood Allergy Has patient had a PCN reaction causing immediate rash, facial/tongue/throat swelling, SOB or lightheadedness with hypotension: Yes Has patient had a PCN reaction causing severe rash involving mucus membranes or skin necrosis: No Has patient had a PCN reaction that required hospitalization: No Has patient had a PCN reaction occurring within the last 10 years: No If all of the above answers are "NO", then may proceed with Cephalosporin use.     Consultations:  none   Procedures/Studies: Dg Chest 2 View  Result Date: 04/18/2018 CLINICAL DATA:  Low white blood count. Cough. History of lung cancer EXAM: CHEST - 2 VIEW COMPARISON:  03/06/2018 chest CT FINDINGS: Left perihilar opacity correlating with airspace and nodular densities on 03/06/2018 chest CT. The right lung is clear although low volume. Normal heart size and mediastinal contours. There is a sclerotic focus in the left proximal humeral metaphysis that is likely chondroid based on comparison chest CT. IMPRESSION: Left perihilar opacity without convincing change from 03/06/2018 chest CT-please reference that report. Superimposed pneumonia may be obscured. Electronically Signed   By: Monte Fantasia M.D.   On: 04/18/2018 14:07     Subjective: No complaints feels great tolerating her diet.  Had a bowel movement this morning.  Discharge Exam: Vitals:    04/19/18 2028 04/20/18 0439  BP: (!) 152/81 133/75  Pulse: (!) 117 98  Resp: 17 19  Temp: 99.4 F (37.4 C) 98.7 F (37.1 C)  SpO2: 99% 99%   Vitals:   04/19/18 1409 04/19/18 2028 04/20/18 0439 04/20/18 0536  BP: (!) 157/76 (!) 152/81 133/75   Pulse: (!) 103 (!) 117 98   Resp: 20 17 19    Temp:  99.4 F (37.4 C) 98.7 F (37.1 C)   TempSrc:  Oral Oral   SpO2: 99% 99% 99%   Weight:    93.7 kg  Height:        General: Pt is alert, awake, not in acute distress Cardiovascular: RRR, S1/S2 +, no rubs, no gallops Respiratory: CTA bilaterally, no wheezing, no rhonchi Abdominal: Soft, NT, ND, bowel sounds + Extremities: no edema, no cyanosis    The results of significant diagnostics from this hospitalization (including imaging, microbiology, ancillary and laboratory) are listed below for reference.     Microbiology: Recent Results (from the past 240 hour(s))  Culture, blood (routine x 2)     Status: None (Preliminary result)  Collection Time: 04/18/18 12:23 PM  Result Value Ref Range Status   Specimen Description   Final    BLOOD BLOOD LEFT FOREARM Performed at [facility] [address] [place], [place] [address]    Special Requests   Final    BOTTLES DRAWN AEROBIC AND ANAEROBIC Blood Culture adequate volume Performed at [facility] [address] [place], [place] [address]    Culture   Final    NO GROWTH 1 DAY Performed at [facility], [place] [address] [place], [place] [address]    Report Status PENDING  Incomplete  Culture, blood (routine x 2)     Status: None (Preliminary result)   Collection Time: 04/18/18 12:23 PM  Result Value Ref Range Status   Specimen Description   Final    BLOOD BLOOD RIGHT FOREARM Performed at [facility] [address] [place], [place] [address]    Special Requests   Final    BOTTLES DRAWN AEROBIC AND ANAEROBIC Blood Culture adequate volume Performed at [facility] [address], [place], [place] [address]    Culture   Final    NO GROWTH 1 DAY Performed at [facility], [place] [address] [place], [place] [address]    Report Status PENDING  Incomplete  Urine culture     Status: Abnormal   Collection Time: 04/18/18  2:38 PM  Result Value Ref Range Status   Specimen Description   Final    URINE, RANDOM Performed at [facility] [address], [place], [place] [address]    Special Requests   Final    NONE Performed at [facility], [place] [address], [place],  [address]    Culture (A)  Final    30,000 COLONIES/mL MULTIPLE SPECIES PRESENT, SUGGEST RECOLLECTION   Report Status 04/20/2018 FINAL  Final     Labs: BNP (last 3 results) Recent Labs    03/06/18 2209  BNP 00.1   Basic Metabolic Panel: Recent Labs  Lab 04/18/18 0817 04/18/18 1222 04/19/18 0521  NA 138 134* 134*  K 4.0 3.2* 3.2*  CL 101 100 103  CO2 26 25 23   GLUCOSE 94 127* 88  BUN 14 18 7*  CREATININE 0.64 0.52 0.43*  CALCIUM 9.4 9.1 7.8*  MG  --   --  1.1*   Liver Function Tests: Recent Labs  Lab 04/18/18 0817 04/18/18 1222 04/19/18 0521  AST 31 37 28  ALT 34 33 27  ALKPHOS 147* 132* 107  BILITOT 1.0 1.1 1.1  PROT 7.1 7.2 6.1*  ALBUMIN 3.3* 3.7 3.2*   No results for input(s): LIPASE, AMYLASE in the last 168 hours. No results for input(s): AMMONIA in the last 168 hours. CBC: Recent Labs  Lab 04/18/18 0817 04/18/18 1222 04/19/18 0521 04/20/18 0457  WBC 1.0* 0.8* 1.7* 3.8*  NEUTROABS 0.0* 0.0* 0.2*  --   HGB 9.9* 9.9* 8.5* 8.3*  HCT 28.6* 29.5* 25.2* 25.9*  MCV 92.0 93.1 94.4 97.4  PLT 50* 48* 44* 53*   Cardiac Enzymes: No results for input(s): CKTOTAL, CKMB, CKMBINDEX, TROPONINI in the last 168 hours. BNP: Invalid input(s): POCBNP CBG: No results for input(s): GLUCAP in the last 168 hours. D-Dimer No results for input(s): DDIMER in the last 72 hours. Hgb A1c No results for  input(s): HGBA1C in the last 72 hours. Lipid Profile No results for input(s): CHOL, HDL, LDLCALC, TRIG, CHOLHDL, LDLDIRECT in the last  72 hours. Thyroid function studies No results for input(s): TSH, T4TOTAL, T3FREE, THYROIDAB in the last 72 hours.  Invalid input(s): FREET3 Anemia work up No results for input(s): VITAMINB12, FOLATE, FERRITIN, TIBC, IRON, RETICCTPCT in the last 72 hours. Urinalysis    Component Value Date/Time   COLORURINE YELLOW 04/18/2018 [place] 04/18/2018 1438   LABSPEC 1.010 04/18/2018 1438   PHURINE 7.0 04/18/2018 1438   GLUCOSEU NEGATIVE 04/18/2018 1438   HGBUR NEGATIVE 04/18/2018 1438   BILIRUBINUR NEGATIVE 04/18/2018 1438   KETONESUR NEGATIVE 04/18/2018 1438   PROTEINUR NEGATIVE 04/18/2018 1438   NITRITE NEGATIVE 04/18/2018 1438   LEUKOCYTESUR NEGATIVE 04/18/2018 1438   Sepsis Labs Invalid input(s): PROCALCITONIN,  WBC,  LACTICIDVEN Microbiology Recent Results (from the past 240 hour(s))  Culture, blood (routine x 2)     Status: None (Preliminary result)   Collection Time: 04/18/18 12:23 PM  Result Value Ref Range Status   Specimen Description   Final    BLOOD BLOOD LEFT FOREARM Performed at [facility], [place] [address] [place], [place] [address]    Special Requests   Final    BOTTLES DRAWN AEROBIC AND ANAEROBIC Blood Culture adequate volume Performed at [facility] [address], [place], [place] [address]    Culture   Final    NO GROWTH 1 DAY Performed at [facility], [place] [address], [place], [place] [address]    Report Status PENDING  Incomplete  Culture, blood (routine x 2)     Status: None (Preliminary result)   Collection Time: 04/18/18 12:23 PM  Result Value Ref Range Status   Specimen Description   Final    BLOOD BLOOD RIGHT FOREARM Performed at [facility] [address], [place], [place] [address]    Special Requests   Final    BOTTLES DRAWN  AEROBIC AND ANAEROBIC Blood Culture adequate volume Performed at [facility] [address], [place], [place] [address]    Culture   Final    NO GROWTH 1 DAY Performed at [facility], [place] [address] [place], [place] [address]    Report Status PENDING  Incomplete  Urine culture     Status: Abnormal   Collection Time: 04/18/18  2:38 PM  Result Value Ref Range Status   Specimen Description   Final    URINE, RANDOM Performed at [facility] [address] [place], [place] [address]    Special Requests   Final    NONE Performed at [facility], [place] [address] [place], [place] [address]    Culture (A)  Final    30,000 COLONIES/mL MULTIPLE SPECIES PRESENT, SUGGEST RECOLLECTION   Report Status 04/20/2018 FINAL  Final     Time coordinating discharge: 40 minutes  SIGNED:   Charlynne Cousins, MD  [facility] 04/20/2018, 9:38 AM Pager   If 7PM-7AM, please contact night-coverage [email or web address] Password [email or web address]

## 2018-04-23 LAB — CULTURE, BLOOD (ROUTINE X 2)
Culture: NO GROWTH
Culture: NO GROWTH
Special Requests: ADEQUATE
Special Requests: ADEQUATE

## 2018-04-24 ENCOUNTER — Other Ambulatory Visit: Payer: Medicare Other

## 2018-04-24 ENCOUNTER — Ambulatory Visit: Payer: Medicare Other

## 2018-04-24 ENCOUNTER — Inpatient Hospital Stay: Payer: Medicare Other

## 2018-04-24 ENCOUNTER — Ambulatory Visit: Payer: Medicare Other | Admitting: Internal Medicine

## 2018-04-24 DIAGNOSIS — R634 Abnormal weight loss: Secondary | ICD-10-CM | POA: Diagnosis not present

## 2018-04-24 DIAGNOSIS — C778 Secondary and unspecified malignant neoplasm of lymph nodes of multiple regions: Secondary | ICD-10-CM | POA: Diagnosis not present

## 2018-04-24 DIAGNOSIS — R5382 Chronic fatigue, unspecified: Secondary | ICD-10-CM

## 2018-04-24 DIAGNOSIS — Z5111 Encounter for antineoplastic chemotherapy: Secondary | ICD-10-CM | POA: Diagnosis not present

## 2018-04-24 DIAGNOSIS — C3412 Malignant neoplasm of upper lobe, left bronchus or lung: Secondary | ICD-10-CM | POA: Diagnosis not present

## 2018-04-24 DIAGNOSIS — M545 Low back pain: Secondary | ICD-10-CM | POA: Diagnosis not present

## 2018-04-24 DIAGNOSIS — I10 Essential (primary) hypertension: Secondary | ICD-10-CM | POA: Diagnosis not present

## 2018-04-24 DIAGNOSIS — C787 Secondary malignant neoplasm of liver and intrahepatic bile duct: Secondary | ICD-10-CM | POA: Diagnosis not present

## 2018-04-24 DIAGNOSIS — Z7901 Long term (current) use of anticoagulants: Secondary | ICD-10-CM | POA: Diagnosis not present

## 2018-04-24 DIAGNOSIS — Z79899 Other long term (current) drug therapy: Secondary | ICD-10-CM | POA: Diagnosis not present

## 2018-04-24 DIAGNOSIS — R5383 Other fatigue: Secondary | ICD-10-CM | POA: Diagnosis not present

## 2018-04-24 DIAGNOSIS — Z7689 Persons encountering health services in other specified circumstances: Secondary | ICD-10-CM | POA: Diagnosis not present

## 2018-04-24 DIAGNOSIS — C3492 Malignant neoplasm of unspecified part of left bronchus or lung: Secondary | ICD-10-CM

## 2018-04-24 LAB — CMP (CANCER CENTER ONLY)
ALK PHOS: 183 U/L — AB (ref 38–126)
ALT: 24 U/L (ref 0–44)
AST: 32 U/L (ref 15–41)
Albumin: 3.2 g/dL — ABNORMAL LOW (ref 3.5–5.0)
Anion gap: 11 (ref 5–15)
BUN: 9 mg/dL (ref 8–23)
CALCIUM: 8.9 mg/dL (ref 8.9–10.3)
CHLORIDE: 107 mmol/L (ref 98–111)
CO2: 23 mmol/L (ref 22–32)
CREATININE: 0.66 mg/dL (ref 0.44–1.00)
GFR, Estimated: >60 mL/min (ref >60)
Glucose, Bld: 107 mg/dL — ABNORMAL HIGH (ref 70–99)
Potassium: 4.2 mmol/L (ref 3.5–5.1)
SODIUM: 141 mmol/L (ref 135–145)
Total Bilirubin: 0.4 mg/dL (ref 0.3–1.2)
Total Protein: 7 g/dL (ref 6.5–8.1)

## 2018-04-24 LAB — CBC WITH DIFFERENTIAL (CANCER CENTER ONLY)
ABS IMMATURE GRANULOCYTES: 0.45 10*3/uL — AB (ref 0.00–0.07)
BASOS ABS: 0 10*3/uL (ref 0.0–0.1)
Basophils Relative: 0 %
EOS ABS: 0 10*3/uL (ref 0.0–0.5)
EOS PCT: 0 %
HEMATOCRIT: 28.9 % — AB (ref 36.0–46.0)
HEMOGLOBIN: 9.5 g/dL — AB (ref 12.0–15.0)
Immature Granulocytes: 7 %
LYMPHS ABS: 1.3 10*3/uL (ref 0.7–4.0)
LYMPHS PCT: 21 %
MCH: 31.7 pg (ref 26.0–34.0)
MCHC: 32.9 g/dL (ref 30.0–36.0)
MCV: 96.3 fL (ref 80.0–100.0)
MONO ABS: 0.9 10*3/uL (ref 0.1–1.0)
MONOS PCT: 14 %
NRBC: 0.6 % — AB (ref 0.0–0.2)
Neutro Abs: 3.6 10*3/uL (ref 1.7–7.7)
Neutrophils Relative %: 58 %
Platelet Count: 108 10*3/uL — ABNORMAL LOW (ref 150–400)
RBC: 3 MIL/uL — ABNORMAL LOW (ref 3.87–5.11)
RDW: 17.2 % — AB (ref 11.5–15.5)
WBC Count: 6.3 10*3/uL (ref 4.0–10.5)

## 2018-04-24 LAB — TSH

## 2018-04-26 ENCOUNTER — Ambulatory Visit: Payer: Medicare Other

## 2018-04-29 ENCOUNTER — Ambulatory Visit (HOSPITAL_COMMUNITY)
Admission: RE | Admit: 2018-04-29 | Discharge: 2018-04-29 | Disposition: A | Discharge status: Home / Self Care | Payer: Medicare Other | Source: Ambulatory Visit | Attending: Oncology | Admitting: Oncology

## 2018-04-29 DIAGNOSIS — C3492 Malignant neoplasm of unspecified part of left bronchus or lung: Secondary | ICD-10-CM | POA: Diagnosis not present

## 2018-04-29 DIAGNOSIS — C787 Secondary malignant neoplasm of liver and intrahepatic bile duct: Secondary | ICD-10-CM | POA: Diagnosis not present

## 2018-04-29 DIAGNOSIS — C3490 Malignant neoplasm of unspecified part of unspecified bronchus or lung: Secondary | ICD-10-CM | POA: Diagnosis not present

## 2018-04-29 MED ORDER — IOHEXOL 300 MG/ML  SOLN
100.0000 mL | Freq: Once | INTRAMUSCULAR | Status: AC | PRN
  Administered 2018-04-29: 100 mL via INTRAVENOUS

## 2018-04-29 MED ORDER — SODIUM CHLORIDE (PF) 0.9 % IJ SOLN
INTRAMUSCULAR | Status: AC
  Filled 2018-04-29: qty 50

## 2018-05-02 ENCOUNTER — Inpatient Hospital Stay (HOSPITAL_BASED_OUTPATIENT_CLINIC_OR_DEPARTMENT_OTHER): Payer: Medicare Other | Admitting: Internal Medicine

## 2018-05-02 ENCOUNTER — Inpatient Hospital Stay: Payer: Medicare Other | Admitting: Nutrition

## 2018-05-02 ENCOUNTER — Other Ambulatory Visit: Payer: Self-pay

## 2018-05-02 ENCOUNTER — Inpatient Hospital Stay: Payer: Medicare Other

## 2018-05-02 ENCOUNTER — Encounter: Payer: Self-pay | Admitting: Internal Medicine

## 2018-05-02 ENCOUNTER — Inpatient Hospital Stay: Payer: Medicare Other | Attending: Internal Medicine

## 2018-05-02 VITALS — BP 133/73 | HR 95 | Temp 97.6°F | Resp 17 | Ht 66.0 in | Wt 197.9 lb

## 2018-05-02 DIAGNOSIS — Z79899 Other long term (current) drug therapy: Secondary | ICD-10-CM

## 2018-05-02 DIAGNOSIS — N200 Calculus of kidney: Secondary | ICD-10-CM

## 2018-05-02 DIAGNOSIS — C3412 Malignant neoplasm of upper lobe, left bronchus or lung: Secondary | ICD-10-CM

## 2018-05-02 DIAGNOSIS — D702 Other drug-induced agranulocytosis: Secondary | ICD-10-CM

## 2018-05-02 DIAGNOSIS — Z7689 Persons encountering health services in other specified circumstances: Secondary | ICD-10-CM | POA: Diagnosis not present

## 2018-05-02 DIAGNOSIS — Z7901 Long term (current) use of anticoagulants: Secondary | ICD-10-CM

## 2018-05-02 DIAGNOSIS — Z5111 Encounter for antineoplastic chemotherapy: Secondary | ICD-10-CM

## 2018-05-02 DIAGNOSIS — R5383 Other fatigue: Secondary | ICD-10-CM | POA: Insufficient documentation

## 2018-05-02 DIAGNOSIS — M4316 Spondylolisthesis, lumbar region: Secondary | ICD-10-CM | POA: Insufficient documentation

## 2018-05-02 DIAGNOSIS — G629 Polyneuropathy, unspecified: Secondary | ICD-10-CM | POA: Diagnosis not present

## 2018-05-02 DIAGNOSIS — D1771 Benign lipomatous neoplasm of kidney: Secondary | ICD-10-CM | POA: Insufficient documentation

## 2018-05-02 DIAGNOSIS — C778 Secondary and unspecified malignant neoplasm of lymph nodes of multiple regions: Secondary | ICD-10-CM | POA: Insufficient documentation

## 2018-05-02 DIAGNOSIS — C787 Secondary malignant neoplasm of liver and intrahepatic bile duct: Secondary | ICD-10-CM | POA: Insufficient documentation

## 2018-05-02 DIAGNOSIS — D61818 Other pancytopenia: Secondary | ICD-10-CM

## 2018-05-02 DIAGNOSIS — M5136 Other intervertebral disc degeneration, lumbar region: Secondary | ICD-10-CM

## 2018-05-02 DIAGNOSIS — I10 Essential (primary) hypertension: Secondary | ICD-10-CM | POA: Insufficient documentation

## 2018-05-02 DIAGNOSIS — C3492 Malignant neoplasm of unspecified part of left bronchus or lung: Secondary | ICD-10-CM

## 2018-05-02 DIAGNOSIS — Z5112 Encounter for antineoplastic immunotherapy: Secondary | ICD-10-CM

## 2018-05-02 DIAGNOSIS — Z86711 Personal history of pulmonary embolism: Secondary | ICD-10-CM | POA: Diagnosis not present

## 2018-05-02 DIAGNOSIS — T451X5S Adverse effect of antineoplastic and immunosuppressive drugs, sequela: Secondary | ICD-10-CM

## 2018-05-02 DIAGNOSIS — C3490 Malignant neoplasm of unspecified part of unspecified bronchus or lung: Secondary | ICD-10-CM

## 2018-05-02 DIAGNOSIS — D701 Agranulocytosis secondary to cancer chemotherapy: Secondary | ICD-10-CM

## 2018-05-02 LAB — CMP (CANCER CENTER ONLY)
ALBUMIN: 3.3 g/dL — AB (ref 3.5–5.0)
ALT: 27 U/L (ref 0–44)
AST: 35 U/L (ref 15–41)
Alkaline Phosphatase: 169 U/L — ABNORMAL HIGH (ref 38–126)
Anion gap: 10 (ref 5–15)
BUN: 16 mg/dL (ref 8–23)
CO2: 20 mmol/L — ABNORMAL LOW (ref 22–32)
Calcium: 9 mg/dL (ref 8.9–10.3)
Chloride: 107 mmol/L (ref 98–111)
Creatinine: 0.66 mg/dL (ref 0.44–1.00)
GFR, Est AFR Am: >60 mL/min (ref >60)
GFR, Estimated: >60 mL/min (ref >60)
Glucose, Bld: 116 mg/dL — ABNORMAL HIGH (ref 70–99)
Potassium: 3.8 mmol/L (ref 3.5–5.1)
Sodium: 137 mmol/L (ref 135–145)
Total Bilirubin: 0.6 mg/dL (ref 0.3–1.2)
Total Protein: 7.2 g/dL (ref 6.5–8.1)

## 2018-05-02 LAB — CBC WITH DIFFERENTIAL (CANCER CENTER ONLY)
Abs Immature Granulocytes: 0.01 10*3/uL (ref 0.00–0.07)
Basophils Absolute: 0 10*3/uL (ref 0.0–0.1)
Basophils Relative: 0 %
Eosinophils Absolute: 0 10*3/uL (ref 0.0–0.5)
Eosinophils Relative: 0 %
HCT: 29.6 % — ABNORMAL LOW (ref 36.0–46.0)
Hemoglobin: 10 g/dL — ABNORMAL LOW (ref 12.0–15.0)
IMMATURE GRANULOCYTES: 0 %
Lymphocytes Relative: 24 %
Lymphs Abs: 1.3 10*3/uL (ref 0.7–4.0)
MCH: 32.1 pg (ref 26.0–34.0)
MCHC: 33.8 g/dL (ref 30.0–36.0)
MCV: 94.9 fL (ref 80.0–100.0)
Monocytes Absolute: 0.9 10*3/uL (ref 0.1–1.0)
Monocytes Relative: 17 %
NEUTROS PCT: 59 %
Neutro Abs: 3 10*3/uL (ref 1.7–7.7)
Platelet Count: 182 10*3/uL (ref 150–400)
RBC: 3.12 MIL/uL — ABNORMAL LOW (ref 3.87–5.11)
RDW: 18 % — ABNORMAL HIGH (ref 11.5–15.5)
WBC Count: 5.2 10*3/uL (ref 4.0–10.5)
nRBC: 0 % (ref 0.0–0.2)

## 2018-05-02 MED ORDER — SODIUM CHLORIDE 0.9 % IV SOLN
Freq: Once | INTRAVENOUS | Status: AC
  Administered 2018-05-02: 13:00:00 via INTRAVENOUS
  Filled 2018-05-02: qty 5

## 2018-05-02 MED ORDER — SODIUM CHLORIDE 0.9 % IV SOLN
Freq: Once | INTRAVENOUS | Status: AC
  Administered 2018-05-02: 12:00:00 via INTRAVENOUS
  Filled 2018-05-02: qty 250

## 2018-05-02 MED ORDER — FAMOTIDINE IN NACL 20-0.9 MG/50ML-% IV SOLN
INTRAVENOUS | Status: AC
  Filled 2018-05-02: qty 50

## 2018-05-02 MED ORDER — PALONOSETRON HCL INJECTION 0.25 MG/5ML
0.2500 mg | Freq: Once | INTRAVENOUS | Status: AC
  Administered 2018-05-02: 0.25 mg via INTRAVENOUS

## 2018-05-02 MED ORDER — PALONOSETRON HCL INJECTION 0.25 MG/5ML
INTRAVENOUS | Status: AC
  Filled 2018-05-02: qty 5

## 2018-05-02 MED ORDER — SODIUM CHLORIDE 0.9 % IV SOLN
150.0000 mg/m2 | Freq: Once | INTRAVENOUS | Status: AC
  Administered 2018-05-02: 318 mg via INTRAVENOUS
  Filled 2018-05-02: qty 53

## 2018-05-02 MED ORDER — SODIUM CHLORIDE 0.9 % IV SOLN
1200.0000 mg | Freq: Once | INTRAVENOUS | Status: AC
  Administered 2018-05-02: 1200 mg via INTRAVENOUS
  Filled 2018-05-02: qty 20

## 2018-05-02 MED ORDER — GABAPENTIN 100 MG PO CAPS: 300.0000 mg | ORAL_CAPSULE | Freq: Three times a day (TID) | ORAL | 1 refills | Status: DC

## 2018-05-02 MED ORDER — APIXABAN 5 MG PO TABS: 5.0000 mg | ORAL_TABLET | Freq: Two times a day (BID) | ORAL | 2 refills | Status: DC

## 2018-05-02 MED ORDER — SODIUM CHLORIDE 0.9 % IV SOLN
14.3000 mg/kg | INTRAVENOUS | Status: DC
  Administered 2018-05-02: 1400 mg via INTRAVENOUS
  Filled 2018-05-02: qty 48

## 2018-05-02 MED ORDER — DIPHENHYDRAMINE HCL 50 MG/ML IJ SOLN
INTRAMUSCULAR | Status: AC
  Filled 2018-05-02: qty 1

## 2018-05-02 MED ORDER — DIPHENHYDRAMINE HCL 50 MG/ML IJ SOLN
50.0000 mg | Freq: Once | INTRAMUSCULAR | Status: AC
  Administered 2018-05-02: 50 mg via INTRAVENOUS

## 2018-05-02 MED ORDER — OXYCODONE-ACETAMINOPHEN 5-325 MG PO TABS: 1.0000 | ORAL_TABLET | Freq: Four times a day (QID) | ORAL | 0 refills | Status: DC | PRN

## 2018-05-02 MED ORDER — FAMOTIDINE IN NACL 20-0.9 MG/50ML-% IV SOLN
20.0000 mg | Freq: Once | INTRAVENOUS | Status: AC
  Administered 2018-05-02: 20 mg via INTRAVENOUS

## 2018-05-02 MED ORDER — SODIUM CHLORIDE 0.9 % IV SOLN
540.0000 mg | Freq: Once | INTRAVENOUS | Status: AC
  Administered 2018-05-02: 540 mg via INTRAVENOUS
  Filled 2018-05-02: qty 54

## 2018-05-02 NOTE — Progress Notes (Signed)
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   She is a patient of Dr. Julien Nordmann.  Past medical history includes hypertension.  Medications include Synthroid, multivitamin, Compazine, vitamin C, and Imodium.  Labs include glucose 116 and albumin 3.3 on December 5.  Height: 5 feet 6 inches. Weight: 197.9 pounds December 5. Usual body weight: 215 pounds in September 2019. BMI: 31.94.  Patient reports she has poor appetite and endorses weight loss but describes it as "not so much." She does have fatigue. She occasionally drinks Ensure original. Patient is not interested in discussing nutrition today.  Nutrition diagnosis:  Unintended weight loss related to metastatic lung cancer as evidenced by 8% weight loss in 3 months.  Intervention: Brief education provided on improving poor appetite and concentrating on high-calorie, high-protein foods. Recommended patient try Ensure Enlive and provided coupons. Teach back method was used and patient was provided with my contact information.  Monitoring, evaluation, goals: Patient will increase calories and protein to minimize weight loss.  Next visit: Thursday, January 16 during infusion.  **Disclaimer: This note was dictated with voice recognition software. Similar sounding words can inadvertently be transcribed and this note may contain transcription errors which may not have been corrected upon publication of note.**

## 2018-05-02 NOTE — Patient Instructions (Signed)
[facility] Discharge Instructions for Patients Receiving Chemotherapy  Today you received the following chemotherapy agents: Avastin, Tecentriq, Paclitaxel, and Carboplatin.  To help prevent nausea and vomiting after your treatment, we encourage you to take your nausea medication as prescribed.   If you develop nausea and vomiting that is not controlled by your nausea medication, call the clinic.   BELOW ARE SYMPTOMS THAT SHOULD BE REPORTED IMMEDIATELY:  *FEVER GREATER THAN 100.5 F  *CHILLS WITH OR WITHOUT FEVER  NAUSEA AND VOMITING THAT IS NOT CONTROLLED WITH YOUR NAUSEA MEDICATION  *UNUSUAL SHORTNESS OF BREATH  *UNUSUAL BRUISING OR BLEEDING  TENDERNESS IN MOUTH AND THROAT WITH OR WITHOUT PRESENCE OF ULCERS  *URINARY PROBLEMS  *BOWEL PROBLEMS  UNUSUAL RASH Items with * indicate a potential emergency and should be followed up as soon as possible.  Feel free to call the clinic should you have any questions or concerns. The clinic phone number is [phone].  Please show the [facility] at check-in to the Emergency Department and triage nurse.

## 2018-05-02 NOTE — Progress Notes (Signed)
[place] [phone]   [phone]  OFFICE PROGRESS NOTE  System, Pcp Not In No address on file  DIAGNOSIS: Stage IV (T1b, N3, M1b) non-small cell lung cancer, adenocarcinoma with positive EGFR mutation in exon 21 (L858R) and recently found to have T790M resistant mutation, presented with left upper lobe lung mass in addition to left hilar and bilateral mediastinal lymphadenopathy as well as liver metastasis diagnosed in May of 2015.  PRIOR THERAPY: 1) Tarceva 150 mg by mouth daily. Started 11/25/2013. Status post 5 months of treatment discontinued today secondary to intolerance with persistent paronychia. 2) Tarceva 100 mg by mouth daily. Started 06/03/2014. Status post 8 months of treatment.  3) stereotactic radiotherapy to liver lesions under the care of Dr. Tammi Klippel. Last dose 03/02/2017 4) Tagrisso 80 mg by mouth daily status post 34 months of treatment. First dose was given 03/04/2015.  This was discontinued secondary to disease progression.   CURRENT THERAPY: Systemic chemotherapy with carboplatin for AUC of 6, paclitaxel 200 mg/M2, Avastin 15 mg/KG and Tecentriq 1200 mg IV every 3 weeks with Neulasta support.  First dose 02/21/2018.  Status post 3 cycles.  Starting from cycle #4 the dose of carboplatin will be for AUC of 5 and paclitaxel 150 mg/M2.  INTERVAL HISTORY: Debra Hodges 67 y.o. female returns to the clinic today for follow-up visit accompanied by her husband.  The patient is feeling much better today except for the persistent peripheral neuropathy in the fingers and toes.  She denied having any significant chest pain, shortness of breath, cough or hemoptysis.  She continues to complain of low back pain.  She denied having any nausea, vomiting, diarrhea or constipation.  She has no headache or visual changes.  She continues to tolerate her systemic chemotherapy fairly well except for the pancytopenia as well as the peripheral  neuropathy.  She was admitted to the hospital after cycle #3 because of her neutropenia.  The patient had repeat CT scan of the chest, abdomen and pelvis performed recently and she is here for evaluation and discussion of her risk her results.   MEDICAL HISTORY: Past Medical History:  Diagnosis Date  . High blood pressure   . Liver lesion 06/26/2016  . Non-small cell carcinoma of lung, stage 4 ([place]) dx'd 10/2013    ALLERGIES:  is allergic to peanut-containing drug products and penicillins.  MEDICATIONS:  Current Outpatient Medications  Medication Sig Dispense Refill  . acetaminophen (TYLENOL) 325 MG tablet Take 650 mg by mouth every 6 (six) hours as needed for mild pain. Reported on 06/15/2015    . apixaban (ELIQUIS) 5 MG TABS tablet Take 2 tablets (10 mg total) by mouth 2 (two) times daily. (Patient taking differently: Take 5 mg by mouth 2 (two) times daily. ) 24 tablet 0  . calcium carbonate (OS-CAL) 600 MG TABS tablet Take 600 mg by mouth daily.     Marland Kitchen CLIMARA 0.1 MG/24HR patch Place 1 patch onto the skin once a week. On Thursday    . levothyroxine (SYNTHROID, LEVOTHROID) 50 MCG tablet Take 50 mcg by mouth at bedtime.     Marland Kitchen loperamide (IMODIUM) 2 MG capsule Take 2 mg by mouth daily as needed for diarrhea or loose stools. Reported on 11/22/2015    . loratadine (CLARITIN) 10 MG tablet Take 10 mg by mouth daily.    . Multiple Vitamins-Calcium (ONE-A-DAY WOMENS PO) Take 1 tablet by mouth daily.    Marland Kitchen oxyCODONE-acetaminophen (PERCOCET/ROXICET) 5-325 MG tablet  Take 1 tablet by mouth every 6 (six) hours as needed for severe pain. (Patient not taking: Reported on 04/18/2018) 30 tablet 0  . potassium chloride SA (K-DUR,KLOR-CON) 20 MEQ tablet Take 20 mEq by mouth at bedtime.     . prochlorperazine (COMPAZINE) 10 MG tablet TAKE 1 TABLET(10 MG) BY MOUTH EVERY 6 HOURS AS NEEDED FOR NAUSEA OR VOMITING (Patient taking differently: Take 10 mg by mouth every 6 (six) hours as needed for nausea or vomiting. )  385 tablet 3  . Sodium Fluoride (CLINPRO 5000) 1.1 % PSTE Place 1 application onto teeth 2 (two) times daily.     Marland Kitchen spironolactone (ALDACTONE) 25 MG tablet Take 25 mg by mouth daily.     . vitamin C (ASCORBIC ACID) 500 MG tablet Take 500 mg by mouth daily.     No current facility-administered medications for this visit.     SURGICAL HISTORY:  Past Surgical History:  Procedure Laterality Date  . ABDOMINAL HYSTERECTOMY    . BREAST BIOPSY     Right breast  . VIDEO BRONCHOSCOPY Bilateral 10/27/2013   Procedure: VIDEO BRONCHOSCOPY WITH FLUORO;  Surgeon: Collene Gobble, MD;  Location: [facility];  Service: Cardiopulmonary;  Laterality: Bilateral;    REVIEW OF SYSTEMS:  Constitutional: positive for fatigue Eyes: negative Ears, nose, mouth, throat, and face: negative Respiratory: positive for cough Cardiovascular: negative Gastrointestinal: negative Genitourinary:negative Integument/breast: negative Hematologic/lymphatic: positive for easy bruising Musculoskeletal:positive for back pain Neurological: positive for paresthesia Behavioral/Psych: negative Endocrine: negative Allergic/Immunologic: negative   PHYSICAL EXAMINATION: General appearance: alert, cooperative, fatigued and no distress Head: Normocephalic, without obvious abnormality, atraumatic Neck: no adenopathy, no JVD, supple, symmetrical, trachea midline and thyroid not enlarged, symmetric, no tenderness/mass/nodules Lymph nodes: Cervical, supraclavicular, and axillary nodes normal. Resp: clear to auscultation bilaterally Back: symmetric, no curvature. ROM normal. No CVA tenderness. Cardio: regular rate and rhythm, S1, S2 normal, no murmur, click, rub or gallop GI: soft, non-tender; bowel sounds normal; no masses,  no organomegaly Extremities: extremities normal, atraumatic, no cyanosis or edema Neurologic: Alert and oriented X 3, normal strength and tone. Normal symmetric reflexes. Normal coordination and gait  ECOG  PERFORMANCE STATUS: 1 - Symptomatic but completely ambulatory  Blood pressure 133/73, pulse 95, temperature 97.6 F (36.4 C), temperature source Oral, resp. rate 17, height _0  (1.676 m), weight 197 lb 14.4 oz (89.8 kg), SpO2 99 %.  LABORATORY DATA: Lab Results  Component Value Date   WBC 5.2 05/02/2018   HGB 10.0 (L) 05/02/2018   HCT 29.6 (L) 05/02/2018   MCV 94.9 05/02/2018   PLT 182 05/02/2018      Chemistry      Component Value Date/Time   NA 141 04/24/2018 0808   NA 141 04/30/2017 1541   K 4.2 04/24/2018 0808   K 4.1 04/30/2017 1541   CL 107 04/24/2018 0808   CO2 23 04/24/2018 0808   CO2 26 04/30/2017 1541   BUN 9 04/24/2018 0808   BUN 14.1 04/30/2017 1541   CREATININE 0.66 04/24/2018 0808   CREATININE 0.8 04/30/2017 1541      Component Value Date/Time   CALCIUM 8.9 04/24/2018 0808   CALCIUM 9.6 04/30/2017 1541   ALKPHOS 183 (H) 04/24/2018 0808   ALKPHOS 105 04/30/2017 1541   AST 32 04/24/2018 0808   AST 40 (H) 04/30/2017 1541   ALT 24 04/24/2018 0808   ALT 27 04/30/2017 1541   BILITOT 0.4 04/24/2018 0808   BILITOT 0.55 04/30/2017 1541  RADIOGRAPHIC STUDIES: Dg Chest 2 View  Result Date: 04/18/2018 CLINICAL DATA:  Low white blood count. Cough. History of lung cancer EXAM: CHEST - 2 VIEW COMPARISON:  03/06/2018 chest CT FINDINGS: Left perihilar opacity correlating with airspace and nodular densities on 03/06/2018 chest CT. The right lung is clear although low volume. Normal heart size and mediastinal contours. There is a sclerotic focus in the left proximal humeral metaphysis that is likely chondroid based on comparison chest CT. IMPRESSION: Left perihilar opacity without convincing change from 03/06/2018 chest CT-please reference that report. Superimposed pneumonia may be obscured. Electronically Signed   By: Monte Fantasia M.D.   On: 04/18/2018 14:07   Ct Chest W Contrast  Result Date: 04/29/2018 CLINICAL DATA:  Metastatic non-small cell lung cancer  restaging EXAM: CT CHEST, ABDOMEN, AND PELVIS WITH CONTRAST TECHNIQUE: Multidetector CT imaging of the chest, abdomen and pelvis was performed following the standard protocol during bolus administration of intravenous contrast. CONTRAST:  169m OMNIPAQUE IOHEXOL 300 MG/ML  SOLN COMPARISON:  Multiple exams, including 03/06/2018 and 02/08/2018 FINDINGS: CT CHEST FINDINGS Cardiovascular: Ascending aortic ectasia, 3.7 cm diameter. Atherosclerotic calcification of the aortic arch and branch vessels. The previous right pulmonary embolus shown on 03/06/2018 is no longer well seen, although today's exam has significantly reduced sensitivity for pulmonary embolus as it was not performed as a CT angiogram. Mediastinum/Nodes: Stable calcifications anteriorly in the right thyroid lobe. No pathologic thoracic adenopathy identified. Lungs/Pleura: Stable scarring anteriorly in the right upper lobe. Mild scarring anteriorly in the right lower lobe. Bandlike density and confluent nodularity in the left upper lobe and superior segment left lower lobe observed with truncation of lingular airway branches due to a lingular masslike structure measuring approximately 1.8 cm in thickness, previously 2.0 cm in thickness. The bandlike density in the left upper lobe has a greater degree of associated consolidation, with more confluent airspace opacity in the superior segment left lower lobe on image 45/6 compared to the more patchy alveolar opacities shown previously. This confluent airspace opacity measures about 4.6 by 2.7 cm in the superior segment left lower lobe on image 45/6. However, the confluent left suprahilar density shown on the prior exam measuring about 2.0 cm transverse now measures only 1.1 cm transverse. Musculoskeletal: Sclerotic and centrally lucent lesion in the left proximal humerus measuring 1.6 cm in diameter on image 1/6, roughly similar to the prior exam. T7 hemangioma. CT ABDOMEN PELVIS FINDINGS Hepatobiliary: In  general the scattered hypodense hepatic metastatic lesions are reduced in size compared to the 02/08/2018. For example a right hepatic lobe lesion on image 43/2 measures 2.1 by 2.4 cm, formerly 2.6 by 2.7 cm. A lesion in segment 2 of the liver measures 2.7 by 2.7 cm, formerly by my measurements 3.3 by 3.2 cm. The other lesions are commensurate leg reduced in size and no new lesions are identified. Gallbladder unremarkable.  No biliary dilatation. Pancreas: Unremarkable Spleen: Fluid density 9 mm splenic cystic lesion is identified on image 50/2 and appears stable. No splenomegaly. 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 0.6 cm nonobstructive left mid kidney calculus. 2 mm right kidney lower pole nonobstructive renal calculus, image 64/2. No ureteral calculus, hydronephrosis, or hydroureter. Adrenal glands unremarkable. 3 mm linear calculus in the left kidney lower pole. Stomach/Bowel: Unremarkable Vascular/Lymphatic: Aortoiliac atherosclerotic vascular disease. No pathologic adenopathy identified. Reproductive: Uterus absent.  Adnexa unremarkable. Other: No supplemental non-categorized findings. Musculoskeletal: Degenerative subcortical cyst formation along the left acetabulum, stable. Grade 1 degenerative anterolisthesis at  L4-5 with associated degenerative facet arthropathy, stable. IMPRESSION: 1. Reduced size of the hepatic metastatic lesions compared to 02/08/2018. 2. There is more confluent density in a bandlike configuration in the left upper lobe and superior segment left lower lobe, although the more focal left upper lobe suprahilar density is reduced in thickness, at 1.1 cm and previously 2.0 cm. Much of this could be from radiation pneumonitis. Strictly speaking, tumor involvement in the superior segment left lower lobe is not readily excluded. 3. Minimal reduction in size of the lingular nodule. 4. Other imaging findings of potential clinical significance: Aortic  Atherosclerosis (ICD10-I70.0). Ascending aortic ectasia. Reduce conspicuity of prior right pulmonary embolus. Bilateral nonobstructive nephrolithiasis. Stable sclerotic lesion in the left proximal humerus. Electronically Signed   By: Van Clines M.D.   On: 04/29/2018 15:22   Ct Abdomen Pelvis W Contrast  Result Date: 04/29/2018 CLINICAL DATA:  Metastatic non-small cell lung cancer restaging EXAM: CT CHEST, ABDOMEN, AND PELVIS WITH CONTRAST TECHNIQUE: Multidetector CT imaging of the chest, abdomen and pelvis was performed following the standard protocol during bolus administration of intravenous contrast. CONTRAST:  126m OMNIPAQUE IOHEXOL 300 MG/ML  SOLN COMPARISON:  Multiple exams, including 03/06/2018 and 02/08/2018 FINDINGS: CT CHEST FINDINGS Cardiovascular: Ascending aortic ectasia, 3.7 cm diameter. Atherosclerotic calcification of the aortic arch and branch vessels. The previous right pulmonary embolus shown on 03/06/2018 is no longer well seen, although today's exam has significantly reduced sensitivity for pulmonary embolus as it was not performed as a CT angiogram. Mediastinum/Nodes: Stable calcifications anteriorly in the right thyroid lobe. No pathologic thoracic adenopathy identified. Lungs/Pleura: Stable scarring anteriorly in the right upper lobe. Mild scarring anteriorly in the right lower lobe. Bandlike density and confluent nodularity in the left upper lobe and superior segment left lower lobe observed with truncation of lingular airway branches due to a lingular masslike structure measuring approximately 1.8 cm in thickness, previously 2.0 cm in thickness. The bandlike density in the left upper lobe has a greater degree of associated consolidation, with more confluent airspace opacity in the superior segment left lower lobe on image 45/6 compared to the more patchy alveolar opacities shown previously. This confluent airspace opacity measures about 4.6 by 2.7 cm in the superior segment  left lower lobe on image 45/6. However, the confluent left suprahilar density shown on the prior exam measuring about 2.0 cm transverse now measures only 1.1 cm transverse. Musculoskeletal: Sclerotic and centrally lucent lesion in the left proximal humerus measuring 1.6 cm in diameter on image 1/6, roughly similar to the prior exam. T7 hemangioma. CT ABDOMEN PELVIS FINDINGS Hepatobiliary: In general the scattered hypodense hepatic metastatic lesions are reduced in size compared to the 02/08/2018. For example a right hepatic lobe lesion on image 43/2 measures 2.1 by 2.4 cm, formerly 2.6 by 2.7 cm. A lesion in segment 2 of the liver measures 2.7 by 2.7 cm, formerly by my measurements 3.3 by 3.2 cm. The other lesions are commensurate leg reduced in size and no new lesions are identified. Gallbladder unremarkable.  No biliary dilatation. Pancreas: Unremarkable Spleen: Fluid density 9 mm splenic cystic lesion is identified on image 50/2 and appears stable. No splenomegaly. 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 0.6 cm nonobstructive left mid kidney calculus. 2 mm right kidney lower pole nonobstructive renal calculus, image 64/2. No ureteral calculus, hydronephrosis, or hydroureter. Adrenal glands unremarkable. 3 mm linear calculus in the left kidney lower pole. Stomach/Bowel: Unremarkable Vascular/Lymphatic: Aortoiliac atherosclerotic vascular disease. No pathologic adenopathy identified.  Reproductive: Uterus absent.  Adnexa unremarkable. Other: No supplemental non-categorized findings. Musculoskeletal: Degenerative subcortical cyst formation along the left acetabulum, stable. Grade 1 degenerative anterolisthesis at L4-5 with associated degenerative facet arthropathy, stable. IMPRESSION: 1. Reduced size of the hepatic metastatic lesions compared to 02/08/2018. 2. There is more confluent density in a bandlike configuration in the left upper lobe and superior segment left lower lobe,  although the more focal left upper lobe suprahilar density is reduced in thickness, at 1.1 cm and previously 2.0 cm. Much of this could be from radiation pneumonitis. Strictly speaking, tumor involvement in the superior segment left lower lobe is not readily excluded. 3. Minimal reduction in size of the lingular nodule. 4. Other imaging findings of potential clinical significance: Aortic Atherosclerosis (ICD10-I70.0). Ascending aortic ectasia. Reduce conspicuity of prior right pulmonary embolus. Bilateral nonobstructive nephrolithiasis. Stable sclerotic lesion in the left proximal humerus. Electronically Signed   By: Van Clines M.D.   On: 04/29/2018 15:22    ASSESSMENT AND PLAN:  This is a very pleasant 67 years old white female with a stage IV non-small cell lung cancer, adenocarcinoma with positive EGFR mutation in exon 21 (L858R) diagnosed in May 2015 status post treatment with Tarceva for 13 months discontinued secondary to disease progression and development of T790M resistant mutation. The patient is currently undergoing treatment with Tagrisso 80 mg by mouth daily status post 34 months. I also referred the patient to Dr. Tammi Klippel from radiation oncology for consideration of palliative radiotherapy to the enlarging left upper lobe suprahilar mass to avoid further obstruction of her bronchial tree. Repeat CT scan of the chest, abdomen and pelvis showed findings for disease progression especially in the liver. The patient discontinued her treatment with Tagrisso and she was started on systemic chemotherapy with carboplatin for AUC of 6, paclitaxel 200 mg/M2, Avastin 15 mg/KG as well as Tecentriq (Atezolizumab) 1200 mg IV every 3 weeks status post 3 cycles.  She tolerated the chemotherapy fairly well except for the aching pain and fatigue after the Neulasta injection. The patient continues to tolerate this treatment well except for the significant peripheral neuropathy as well as fatigue and  pancytopenia after treatment with some recent hospitalization. She had repeat CT scan of the chest, abdomen and pelvis performed recently.  I personally and independently reviewed the scan images and discussed the results with the patient and her husband.  Her scan showed improvement of her disease with decrease in the size of the lung mass as well as the liver lesions. I recommended for the patient to continue her current treatment but I will reduce the dose of carboplatin to AUC of 5 and paclitaxel to 150 mg/M2 starting from cycle #4. For the peripheral neuropathy, in addition to reducing the dose of paclitaxel, I will start the patient on Neurontin 100 mg p.o. 3 times daily. For the back pain, I gave her a refill of Percocet. For the history of deep venous thrombosis and pulmonary embolus.  She is currently on treatment with Eliquis.  I gave her a refill of this medication. The patient will come back for follow-up visit in 3 weeks for evaluation with the start of cycle #5. She was advised to call immediately if she has any concerning symptoms in the interval. The patient voices understanding of current disease status and treatment options and is in agreement with the current care plan. All questions were answered. The patient knows to call the clinic with any problems, questions or concerns. We can certainly see the  patient much sooner if necessary.  Disclaimer: This note was dictated with voice recognition software. Similar sounding words can inadvertently be transcribed and may not be corrected upon review.

## 2018-05-02 NOTE — Progress Notes (Signed)
Placed IV team consult at [phone] and followed up with a phone call and page with no response; twice between 1000 and 1100. Placed new consult order as STAT for additional follow up.

## 2018-05-03 ENCOUNTER — Telehealth: Payer: Self-pay | Admitting: Internal Medicine

## 2018-05-03 NOTE — Telephone Encounter (Signed)
Scheduled appt per 12/5 los - and 12/5 sch message -   sch message stated that treatment needs to be lengthen to 6 hours instead of 5 - treatment from 12/26 moved to 12/27 due to change in length of treatment - left message for patient with appt date and time.

## 2018-05-04 ENCOUNTER — Inpatient Hospital Stay: Payer: Medicare Other

## 2018-05-04 VITALS — BP 138/75 | HR 99 | Temp 97.8°F | Resp 17

## 2018-05-04 DIAGNOSIS — C3412 Malignant neoplasm of upper lobe, left bronchus or lung: Secondary | ICD-10-CM | POA: Diagnosis not present

## 2018-05-04 DIAGNOSIS — C778 Secondary and unspecified malignant neoplasm of lymph nodes of multiple regions: Secondary | ICD-10-CM | POA: Diagnosis not present

## 2018-05-04 DIAGNOSIS — Z5111 Encounter for antineoplastic chemotherapy: Secondary | ICD-10-CM | POA: Diagnosis not present

## 2018-05-04 DIAGNOSIS — Z7689 Persons encountering health services in other specified circumstances: Secondary | ICD-10-CM | POA: Diagnosis not present

## 2018-05-04 DIAGNOSIS — G629 Polyneuropathy, unspecified: Secondary | ICD-10-CM | POA: Diagnosis not present

## 2018-05-04 DIAGNOSIS — C787 Secondary malignant neoplasm of liver and intrahepatic bile duct: Secondary | ICD-10-CM | POA: Diagnosis not present

## 2018-05-04 DIAGNOSIS — C3492 Malignant neoplasm of unspecified part of left bronchus or lung: Secondary | ICD-10-CM

## 2018-05-04 MED ORDER — PEGFILGRASTIM INJECTION 6 MG/0.6ML ~~LOC~~
6.0000 mg | PREFILLED_SYRINGE | Freq: Once | SUBCUTANEOUS | Status: AC
  Administered 2018-05-04: 6 mg via SUBCUTANEOUS

## 2018-05-04 MED ORDER — PEGFILGRASTIM INJECTION 6 MG/0.6ML ~~LOC~~
PREFILLED_SYRINGE | SUBCUTANEOUS | Status: AC
  Filled 2018-05-04: qty 0.6

## 2018-05-09 ENCOUNTER — Inpatient Hospital Stay: Payer: Medicare Other

## 2018-05-09 DIAGNOSIS — C778 Secondary and unspecified malignant neoplasm of lymph nodes of multiple regions: Secondary | ICD-10-CM | POA: Diagnosis not present

## 2018-05-09 DIAGNOSIS — C3412 Malignant neoplasm of upper lobe, left bronchus or lung: Secondary | ICD-10-CM | POA: Diagnosis not present

## 2018-05-09 DIAGNOSIS — C3492 Malignant neoplasm of unspecified part of left bronchus or lung: Secondary | ICD-10-CM

## 2018-05-09 DIAGNOSIS — G629 Polyneuropathy, unspecified: Secondary | ICD-10-CM | POA: Diagnosis not present

## 2018-05-09 DIAGNOSIS — Z5111 Encounter for antineoplastic chemotherapy: Secondary | ICD-10-CM | POA: Diagnosis not present

## 2018-05-09 DIAGNOSIS — Z7689 Persons encountering health services in other specified circumstances: Secondary | ICD-10-CM | POA: Diagnosis not present

## 2018-05-09 DIAGNOSIS — C787 Secondary malignant neoplasm of liver and intrahepatic bile duct: Secondary | ICD-10-CM | POA: Diagnosis not present

## 2018-05-09 LAB — CMP (CANCER CENTER ONLY)
ALT: 40 U/L (ref 0–44)
AST: 32 U/L (ref 15–41)
Albumin: 3.3 g/dL — ABNORMAL LOW (ref 3.5–5.0)
Alkaline Phosphatase: 163 U/L — ABNORMAL HIGH (ref 38–126)
Anion gap: 10 (ref 5–15)
BUN: 13 mg/dL (ref 8–23)
CO2: 25 mmol/L (ref 22–32)
Calcium: 9.1 mg/dL (ref 8.9–10.3)
Chloride: 103 mmol/L (ref 98–111)
Creatinine: 0.65 mg/dL (ref 0.44–1.00)
GFR, Est AFR Am: >60 mL/min (ref >60)
Glucose, Bld: 111 mg/dL — ABNORMAL HIGH (ref 70–99)
Potassium: 3.9 mmol/L (ref 3.5–5.1)
Sodium: 138 mmol/L (ref 135–145)
Total Bilirubin: 0.9 mg/dL (ref 0.3–1.2)
Total Protein: 7 g/dL (ref 6.5–8.1)

## 2018-05-09 LAB — CBC WITH DIFFERENTIAL (CANCER CENTER ONLY)
Abs Immature Granulocytes: 0.04 10*3/uL (ref 0.00–0.07)
Basophils Absolute: 0 10*3/uL (ref 0.0–0.1)
Basophils Relative: 0 %
Eosinophils Absolute: 0 10*3/uL (ref 0.0–0.5)
Eosinophils Relative: 0 %
HCT: 29.1 % — ABNORMAL LOW (ref 36.0–46.0)
HEMOGLOBIN: 9.7 g/dL — AB (ref 12.0–15.0)
Immature Granulocytes: 3 %
Lymphocytes Relative: 55 %
Lymphs Abs: 0.7 10*3/uL (ref 0.7–4.0)
MCH: 32.2 pg (ref 26.0–34.0)
MCHC: 33.3 g/dL (ref 30.0–36.0)
MCV: 96.7 fL (ref 80.0–100.0)
MONO ABS: 0.3 10*3/uL (ref 0.1–1.0)
Monocytes Relative: 23 %
NRBC: 0 % (ref 0.0–0.2)
Neutro Abs: 0.2 10*3/uL — CL (ref 1.7–7.7)
Neutrophils Relative %: 19 %
Platelet Count: 54 10*3/uL — ABNORMAL LOW (ref 150–400)
RBC: 3.01 MIL/uL — ABNORMAL LOW (ref 3.87–5.11)
RDW: 16.9 % — ABNORMAL HIGH (ref 11.5–15.5)
WBC Count: 1.3 10*3/uL — ABNORMAL LOW (ref 4.0–10.5)

## 2018-05-16 ENCOUNTER — Inpatient Hospital Stay: Payer: Medicare Other

## 2018-05-16 DIAGNOSIS — R5382 Chronic fatigue, unspecified: Secondary | ICD-10-CM

## 2018-05-16 DIAGNOSIS — Z5111 Encounter for antineoplastic chemotherapy: Secondary | ICD-10-CM | POA: Diagnosis not present

## 2018-05-16 DIAGNOSIS — C778 Secondary and unspecified malignant neoplasm of lymph nodes of multiple regions: Secondary | ICD-10-CM | POA: Diagnosis not present

## 2018-05-16 DIAGNOSIS — C3492 Malignant neoplasm of unspecified part of left bronchus or lung: Secondary | ICD-10-CM

## 2018-05-16 DIAGNOSIS — C787 Secondary malignant neoplasm of liver and intrahepatic bile duct: Secondary | ICD-10-CM | POA: Diagnosis not present

## 2018-05-16 DIAGNOSIS — C3412 Malignant neoplasm of upper lobe, left bronchus or lung: Secondary | ICD-10-CM | POA: Diagnosis not present

## 2018-05-16 DIAGNOSIS — Z7689 Persons encountering health services in other specified circumstances: Secondary | ICD-10-CM | POA: Diagnosis not present

## 2018-05-16 DIAGNOSIS — G629 Polyneuropathy, unspecified: Secondary | ICD-10-CM | POA: Diagnosis not present

## 2018-05-16 LAB — CBC WITH DIFFERENTIAL (CANCER CENTER ONLY)
Abs Immature Granulocytes: 0.09 10*3/uL — ABNORMAL HIGH (ref 0.00–0.07)
Basophils Absolute: 0 10*3/uL (ref 0.0–0.1)
Basophils Relative: 0 %
EOS PCT: 1 %
Eosinophils Absolute: 0 10*3/uL (ref 0.0–0.5)
HCT: 30.8 % — ABNORMAL LOW (ref 36.0–46.0)
HEMOGLOBIN: 10.1 g/dL — AB (ref 12.0–15.0)
Immature Granulocytes: 2 %
Lymphocytes Relative: 30 %
Lymphs Abs: 1.6 10*3/uL (ref 0.7–4.0)
MCH: 31.9 pg (ref 26.0–34.0)
MCHC: 32.8 g/dL (ref 30.0–36.0)
MCV: 97.2 fL (ref 80.0–100.0)
MONOS PCT: 13 %
Monocytes Absolute: 0.7 10*3/uL (ref 0.1–1.0)
Neutro Abs: 3 10*3/uL (ref 1.7–7.7)
Neutrophils Relative %: 54 %
Platelet Count: 124 10*3/uL — ABNORMAL LOW (ref 150–400)
RBC: 3.17 MIL/uL — ABNORMAL LOW (ref 3.87–5.11)
RDW: 18.2 % — ABNORMAL HIGH (ref 11.5–15.5)
WBC Count: 5.5 10*3/uL (ref 4.0–10.5)
nRBC: 0 % (ref 0.0–0.2)

## 2018-05-16 LAB — CMP (CANCER CENTER ONLY)
ALBUMIN: 3.3 g/dL — AB (ref 3.5–5.0)
ALT: 23 U/L (ref 0–44)
AST: 30 U/L (ref 15–41)
Alkaline Phosphatase: 178 U/L — ABNORMAL HIGH (ref 38–126)
Anion gap: 9 (ref 5–15)
BUN: 11 mg/dL (ref 8–23)
CO2: 23 mmol/L (ref 22–32)
CREATININE: 0.65 mg/dL (ref 0.44–1.00)
Calcium: 8.7 mg/dL — ABNORMAL LOW (ref 8.9–10.3)
Chloride: 109 mmol/L (ref 98–111)
GFR, Est AFR Am: >60 mL/min (ref >60)
GFR, Estimated: >60 mL/min (ref >60)
Glucose, Bld: 103 mg/dL — ABNORMAL HIGH (ref 70–99)
Potassium: 3.7 mmol/L (ref 3.5–5.1)
Sodium: 141 mmol/L (ref 135–145)
Total Bilirubin: 0.6 mg/dL (ref 0.3–1.2)
Total Protein: 7 g/dL (ref 6.5–8.1)

## 2018-05-16 LAB — TSH: TSH: <0.08 u[IU]/mL — ABNORMAL LOW (ref 0.308–3.960)

## 2018-05-23 ENCOUNTER — Telehealth: Payer: Self-pay

## 2018-05-23 ENCOUNTER — Inpatient Hospital Stay: Payer: Medicare Other

## 2018-05-23 ENCOUNTER — Inpatient Hospital Stay (HOSPITAL_BASED_OUTPATIENT_CLINIC_OR_DEPARTMENT_OTHER): Payer: Medicare Other | Admitting: Internal Medicine

## 2018-05-23 ENCOUNTER — Encounter: Payer: Self-pay | Admitting: Internal Medicine

## 2018-05-23 ENCOUNTER — Other Ambulatory Visit: Payer: Self-pay | Admitting: Medical Oncology

## 2018-05-23 ENCOUNTER — Ambulatory Visit: Payer: Medicare Other

## 2018-05-23 ENCOUNTER — Telehealth: Payer: Self-pay | Admitting: Internal Medicine

## 2018-05-23 VITALS — BP 147/76 | HR 100 | Temp 98.1°F | Resp 20 | Ht 66.0 in | Wt 196.0 lb

## 2018-05-23 DIAGNOSIS — G629 Polyneuropathy, unspecified: Secondary | ICD-10-CM

## 2018-05-23 DIAGNOSIS — Z5111 Encounter for antineoplastic chemotherapy: Secondary | ICD-10-CM | POA: Diagnosis not present

## 2018-05-23 DIAGNOSIS — M5136 Other intervertebral disc degeneration, lumbar region: Secondary | ICD-10-CM

## 2018-05-23 DIAGNOSIS — Z79899 Other long term (current) drug therapy: Secondary | ICD-10-CM

## 2018-05-23 DIAGNOSIS — C787 Secondary malignant neoplasm of liver and intrahepatic bile duct: Secondary | ICD-10-CM

## 2018-05-23 DIAGNOSIS — I10 Essential (primary) hypertension: Secondary | ICD-10-CM

## 2018-05-23 DIAGNOSIS — C778 Secondary and unspecified malignant neoplasm of lymph nodes of multiple regions: Secondary | ICD-10-CM | POA: Diagnosis not present

## 2018-05-23 DIAGNOSIS — M4316 Spondylolisthesis, lumbar region: Secondary | ICD-10-CM

## 2018-05-23 DIAGNOSIS — D1771 Benign lipomatous neoplasm of kidney: Secondary | ICD-10-CM | POA: Diagnosis not present

## 2018-05-23 DIAGNOSIS — N200 Calculus of kidney: Secondary | ICD-10-CM | POA: Diagnosis not present

## 2018-05-23 DIAGNOSIS — Z7689 Persons encountering health services in other specified circumstances: Secondary | ICD-10-CM | POA: Diagnosis not present

## 2018-05-23 DIAGNOSIS — Z5112 Encounter for antineoplastic immunotherapy: Secondary | ICD-10-CM

## 2018-05-23 DIAGNOSIS — E039 Hypothyroidism, unspecified: Secondary | ICD-10-CM

## 2018-05-23 DIAGNOSIS — R5383 Other fatigue: Secondary | ICD-10-CM

## 2018-05-23 DIAGNOSIS — C3492 Malignant neoplasm of unspecified part of left bronchus or lung: Secondary | ICD-10-CM

## 2018-05-23 DIAGNOSIS — Z86711 Personal history of pulmonary embolism: Secondary | ICD-10-CM

## 2018-05-23 DIAGNOSIS — Z7901 Long term (current) use of anticoagulants: Secondary | ICD-10-CM

## 2018-05-23 DIAGNOSIS — D61818 Other pancytopenia: Secondary | ICD-10-CM | POA: Diagnosis not present

## 2018-05-23 DIAGNOSIS — C3412 Malignant neoplasm of upper lobe, left bronchus or lung: Secondary | ICD-10-CM

## 2018-05-23 LAB — CBC WITH DIFFERENTIAL (CANCER CENTER ONLY)
Abs Immature Granulocytes: 0.01 10*3/uL (ref 0.00–0.07)
Basophils Absolute: 0 10*3/uL (ref 0.0–0.1)
Basophils Relative: 0 %
EOS ABS: 0 10*3/uL (ref 0.0–0.5)
EOS PCT: 0 %
HCT: 28.9 % — ABNORMAL LOW (ref 36.0–46.0)
Hemoglobin: 9.6 g/dL — ABNORMAL LOW (ref 12.0–15.0)
Immature Granulocytes: 0 %
Lymphocytes Relative: 16 %
Lymphs Abs: 0.9 10*3/uL (ref 0.7–4.0)
MCH: 32.7 pg (ref 26.0–34.0)
MCHC: 33.2 g/dL (ref 30.0–36.0)
MCV: 98.3 fL (ref 80.0–100.0)
Monocytes Absolute: 0.9 10*3/uL (ref 0.1–1.0)
Monocytes Relative: 17 %
Neutro Abs: 3.7 10*3/uL (ref 1.7–7.7)
Neutrophils Relative %: 67 %
Platelet Count: 135 10*3/uL — ABNORMAL LOW (ref 150–400)
RBC: 2.94 MIL/uL — ABNORMAL LOW (ref 3.87–5.11)
RDW: 17.9 % — ABNORMAL HIGH (ref 11.5–15.5)
WBC Count: 5.5 10*3/uL (ref 4.0–10.5)
nRBC: 0 % (ref 0.0–0.2)

## 2018-05-23 LAB — CMP (CANCER CENTER ONLY)
ALK PHOS: 174 U/L — AB (ref 38–126)
ALT: 31 U/L (ref 0–44)
AST: 43 U/L — ABNORMAL HIGH (ref 15–41)
Albumin: 3.2 g/dL — ABNORMAL LOW (ref 3.5–5.0)
Anion gap: 7 (ref 5–15)
BUN: 13 mg/dL (ref 8–23)
CALCIUM: 8.9 mg/dL (ref 8.9–10.3)
CO2: 26 mmol/L (ref 22–32)
CREATININE: 0.65 mg/dL (ref 0.44–1.00)
Chloride: 105 mmol/L (ref 98–111)
GFR, Est AFR Am: >60 mL/min (ref >60)
GFR, Estimated: >60 mL/min (ref >60)
Glucose, Bld: 101 mg/dL — ABNORMAL HIGH (ref 70–99)
Potassium: 3.9 mmol/L (ref 3.5–5.1)
Sodium: 138 mmol/L (ref 135–145)
Total Bilirubin: 0.8 mg/dL (ref 0.3–1.2)
Total Protein: 7 g/dL (ref 6.5–8.1)

## 2018-05-23 NOTE — Telephone Encounter (Signed)
Unable to schedule appts per 12/26 los due to capped day - logged - will call patient when appt is scheduled

## 2018-05-23 NOTE — Telephone Encounter (Signed)
Treatment for next week logged in Chemo request Book. Per 12/26 los

## 2018-05-23 NOTE — Progress Notes (Signed)
[facility] [phone]   [phone]) [phone]  OFFICE PROGRESS NOTE  System, Pcp Not In No address on file  DIAGNOSIS: Stage IV (T1b, N3, M1b) non-small cell lung cancer, adenocarcinoma with positive EGFR mutation in exon 21 (L858R) and recently found to have T790M resistant mutation, presented with left upper lobe lung mass in addition to left hilar and bilateral mediastinal lymphadenopathy as well as liver metastasis diagnosed in May of 2015.  PRIOR THERAPY: 1) Tarceva 150 mg by mouth daily. Started 11/25/2013. Status post 5 months of treatment discontinued today secondary to intolerance with persistent paronychia. 2) Tarceva 100 mg by mouth daily. Started 06/03/2014. Status post 8 months of treatment.  3) stereotactic radiotherapy to liver lesions under the care of Dr. Tammi Klippel. Last dose 03/02/2017 4) Tagrisso 80 mg by mouth daily status post 34 months of treatment. First dose was given 03/04/2015.  This was discontinued secondary to disease progression. 5) Systemic chemotherapy with carboplatin for AUC of 6, paclitaxel 200 mg/M2, Avastin 15 mg/KG and Tecentriq 1200 mg IV every 3 weeks with Neulasta support.  First dose 02/21/2018.  Status post 4 cycles.  Starting from cycle #4 the dose of carboplatin will be for AUC of 5 and paclitaxel 150 mg/M2.   CURRENT THERAPY: Maintenance treatment with Avastin 15 mg/KG and Tecentriq 1200 mg IV every 3 weeks.  First cycle May 30, 2018  INTERVAL HISTORY: Debra Hodges 67 y.o. female returns to the clinic today for follow-up visit accompanied by her husband.  The patient has not feeling well today and has sore throat as well as early flulike symptoms.  She received her flu vaccine few months ago.  She denied having any current chest pain, shortness of breath, cough or hemoptysis.  She has no current nausea, vomiting, diarrhea or constipation.  She has no headache or visual changes.  She has no fever or chills.  She has  been tolerating her treatment with chemotherapy fairly well except for fatigue.  The patient also mentions that her brother has a terminal condition and expected to pass in the next few days and she may need to travel to [place] to see him.  She is supposed to start cycle #5 of her treatment today.   MEDICAL HISTORY: Past Medical History:  Diagnosis Date  . High blood pressure   . Liver lesion 06/26/2016  . Non-small cell carcinoma of lung, stage 4 ([facility]) dx'd 10/2013    ALLERGIES:  is allergic to peanut-containing drug products and penicillins.  MEDICATIONS:  Current Outpatient Medications  Medication Sig Dispense Refill  . acetaminophen (TYLENOL) 325 MG tablet Take 650 mg by mouth every 6 (six) hours as needed for mild pain. Reported on 06/15/2015    . apixaban (ELIQUIS) 5 MG TABS tablet Take 1 tablet (5 mg total) by mouth 2 (two) times daily. 60 tablet 2  . calcium carbonate (OS-CAL) 600 MG TABS tablet Take 600 mg by mouth daily.     Marland Kitchen CLIMARA 0.1 MG/24HR patch Place 1 patch onto the skin once a week. On Thursday    . gabapentin (NEURONTIN) 100 MG capsule Take 3 capsules (300 mg total) by mouth 3 (three) times daily. 90 capsule 1  . levothyroxine (SYNTHROID, LEVOTHROID) 50 MCG tablet Take 50 mcg by mouth at bedtime.     Marland Kitchen loperamide (IMODIUM) 2 MG capsule Take 2 mg by mouth daily as needed for diarrhea or loose stools. Reported on 11/22/2015    . loratadine (CLARITIN)  10 MG tablet Take 10 mg by mouth daily.    . Multiple Vitamins-Calcium (ONE-A-DAY WOMENS PO) Take 1 tablet by mouth daily.    Marland Kitchen oxyCODONE-acetaminophen (PERCOCET/ROXICET) 5-325 MG tablet Take 1 tablet by mouth every 6 (six) hours as needed for severe pain. 60 tablet 0  . potassium chloride SA (K-DUR,KLOR-CON) 20 MEQ tablet Take 20 mEq by mouth at bedtime.     . prochlorperazine (COMPAZINE) 10 MG tablet TAKE 1 TABLET(10 MG) BY MOUTH EVERY 6 HOURS AS NEEDED FOR NAUSEA OR VOMITING (Patient taking differently: Take 10 mg by  mouth every 6 (six) hours as needed for nausea or vomiting. ) 385 tablet 3  . Sodium Fluoride (CLINPRO 5000) 1.1 % PSTE Place 1 application onto teeth 2 (two) times daily.     Marland Kitchen spironolactone (ALDACTONE) 25 MG tablet Take 25 mg by mouth daily.     . vitamin C (ASCORBIC ACID) 500 MG tablet Take 500 mg by mouth daily.     No current facility-administered medications for this visit.     SURGICAL HISTORY:  Past Surgical History:  Procedure Laterality Date  . ABDOMINAL HYSTERECTOMY    . BREAST BIOPSY     Right breast  . VIDEO BRONCHOSCOPY Bilateral 10/27/2013   Procedure: VIDEO BRONCHOSCOPY WITH FLUORO;  Surgeon: Collene Gobble, MD;  Location: [facility];  Service: Cardiopulmonary;  Laterality: Bilateral;    REVIEW OF SYSTEMS:  A comprehensive review of systems was negative except for: Constitutional: positive for fatigue Ears, nose, mouth, throat, and face: positive for nasal congestion and sore throat   PHYSICAL EXAMINATION: General appearance: alert, cooperative, fatigued and no distress Head: Normocephalic, without obvious abnormality, atraumatic Neck: no adenopathy, no JVD, supple, symmetrical, trachea midline and thyroid not enlarged, symmetric, no tenderness/mass/nodules Lymph nodes: Cervical, supraclavicular, and axillary nodes normal. Resp: clear to auscultation bilaterally Back: symmetric, no curvature. ROM normal. No CVA tenderness. Cardio: regular rate and rhythm, S1, S2 normal, no murmur, click, rub or gallop GI: soft, non-tender; bowel sounds normal; no masses,  no organomegaly Extremities: extremities normal, atraumatic, no cyanosis or edema  ECOG PERFORMANCE STATUS: 1 - Symptomatic but completely ambulatory  Blood pressure (!) 147/76, pulse 100, temperature 98.1 F (36.7 C), temperature source Oral, resp. rate 20, height _0  (1.676 m), weight 196 lb (88.9 kg), SpO2 98 %.  LABORATORY DATA: Lab Results  Component Value Date   WBC 5.5 05/23/2018   HGB 9.6 (L)  05/23/2018   HCT 28.9 (L) 05/23/2018   MCV 98.3 05/23/2018   PLT 135 (L) 05/23/2018      Chemistry      Component Value Date/Time   NA 138 05/23/2018 1121   NA 141 04/30/2017 1541   K 3.9 05/23/2018 1121   K 4.1 04/30/2017 1541   CL 105 05/23/2018 1121   CO2 26 05/23/2018 1121   CO2 26 04/30/2017 1541   BUN 13 05/23/2018 1121   BUN 14.1 04/30/2017 1541   CREATININE 0.65 05/23/2018 1121   CREATININE 0.8 04/30/2017 1541      Component Value Date/Time   CALCIUM 8.9 05/23/2018 1121   CALCIUM 9.6 04/30/2017 1541   ALKPHOS 174 (H) 05/23/2018 1121   ALKPHOS 105 04/30/2017 1541   AST 43 (H) 05/23/2018 1121   AST 40 (H) 04/30/2017 1541   ALT 31 05/23/2018 1121   ALT 27 04/30/2017 1541   BILITOT 0.8 05/23/2018 1121   BILITOT 0.55 04/30/2017 1541       RADIOGRAPHIC STUDIES: Ct Chest W Contrast  Result Date: 04/29/2018 CLINICAL DATA:  Metastatic non-small cell lung cancer restaging EXAM: CT CHEST, ABDOMEN, AND PELVIS WITH CONTRAST TECHNIQUE: Multidetector CT imaging of the chest, abdomen and pelvis was performed following the standard protocol during bolus administration of intravenous contrast. CONTRAST:  13m OMNIPAQUE IOHEXOL 300 MG/ML  SOLN COMPARISON:  Multiple exams, including 03/06/2018 and 02/08/2018 FINDINGS: CT CHEST FINDINGS Cardiovascular: Ascending aortic ectasia, 3.7 cm diameter. Atherosclerotic calcification of the aortic arch and branch vessels. The previous right pulmonary embolus shown on 03/06/2018 is no longer well seen, although today's exam has significantly reduced sensitivity for pulmonary embolus as it was not performed as a CT angiogram. Mediastinum/Nodes: Stable calcifications anteriorly in the right thyroid lobe. No pathologic thoracic adenopathy identified. Lungs/Pleura: Stable scarring anteriorly in the right upper lobe. Mild scarring anteriorly in the right lower lobe. Bandlike density and confluent nodularity in the left upper lobe and superior segment left  lower lobe observed with truncation of lingular airway branches due to a lingular masslike structure measuring approximately 1.8 cm in thickness, previously 2.0 cm in thickness. The bandlike density in the left upper lobe has a greater degree of associated consolidation, with more confluent airspace opacity in the superior segment left lower lobe on image 45/6 compared to the more patchy alveolar opacities shown previously. This confluent airspace opacity measures about 4.6 by 2.7 cm in the superior segment left lower lobe on image 45/6. However, the confluent left suprahilar density shown on the prior exam measuring about 2.0 cm transverse now measures only 1.1 cm transverse. Musculoskeletal: Sclerotic and centrally lucent lesion in the left proximal humerus measuring 1.6 cm in diameter on image 1/6, roughly similar to the prior exam. T7 hemangioma. CT ABDOMEN PELVIS FINDINGS Hepatobiliary: In general the scattered hypodense hepatic metastatic lesions are reduced in size compared to the 02/08/2018. For example a right hepatic lobe lesion on image 43/2 measures 2.1 by 2.4 cm, formerly 2.6 by 2.7 cm. A lesion in segment 2 of the liver measures 2.7 by 2.7 cm, formerly by my measurements 3.3 by 3.2 cm. The other lesions are commensurate leg reduced in size and no new lesions are identified. Gallbladder unremarkable.  No biliary dilatation. Pancreas: Unremarkable Spleen: Fluid density 9 mm splenic cystic lesion is identified on image 50/2 and appears stable. No splenomegaly. 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 0.6 cm nonobstructive left mid kidney calculus. 2 mm right kidney lower pole nonobstructive renal calculus, image 64/2. No ureteral calculus, hydronephrosis, or hydroureter. Adrenal glands unremarkable. 3 mm linear calculus in the left kidney lower pole. Stomach/Bowel: Unremarkable Vascular/Lymphatic: Aortoiliac atherosclerotic vascular disease. No pathologic adenopathy  identified. Reproductive: Uterus absent.  Adnexa unremarkable. Other: No supplemental non-categorized findings. Musculoskeletal: Degenerative subcortical cyst formation along the left acetabulum, stable. Grade 1 degenerative anterolisthesis at L4-5 with associated degenerative facet arthropathy, stable. IMPRESSION: 1. Reduced size of the hepatic metastatic lesions compared to 02/08/2018. 2. There is more confluent density in a bandlike configuration in the left upper lobe and superior segment left lower lobe, although the more focal left upper lobe suprahilar density is reduced in thickness, at 1.1 cm and previously 2.0 cm. Much of this could be from radiation pneumonitis. Strictly speaking, tumor involvement in the superior segment left lower lobe is not readily excluded. 3. Minimal reduction in size of the lingular nodule. 4. Other imaging findings of potential clinical significance: Aortic Atherosclerosis (ICD10-I70.0). Ascending aortic ectasia. Reduce conspicuity of prior right pulmonary embolus. Bilateral nonobstructive nephrolithiasis. Stable sclerotic lesion in  the left proximal humerus. Electronically Signed   By: Van Clines M.D.   On: 04/29/2018 15:22   Ct Abdomen Pelvis W Contrast  Result Date: 04/29/2018 CLINICAL DATA:  Metastatic non-small cell lung cancer restaging EXAM: CT CHEST, ABDOMEN, AND PELVIS WITH CONTRAST TECHNIQUE: Multidetector CT imaging of the chest, abdomen and pelvis was performed following the standard protocol during bolus administration of intravenous contrast. CONTRAST:  159m OMNIPAQUE IOHEXOL 300 MG/ML  SOLN COMPARISON:  Multiple exams, including 03/06/2018 and 02/08/2018 FINDINGS: CT CHEST FINDINGS Cardiovascular: Ascending aortic ectasia, 3.7 cm diameter. Atherosclerotic calcification of the aortic arch and branch vessels. The previous right pulmonary embolus shown on 03/06/2018 is no longer well seen, although today's exam has significantly reduced sensitivity for  pulmonary embolus as it was not performed as a CT angiogram. Mediastinum/Nodes: Stable calcifications anteriorly in the right thyroid lobe. No pathologic thoracic adenopathy identified. Lungs/Pleura: Stable scarring anteriorly in the right upper lobe. Mild scarring anteriorly in the right lower lobe. Bandlike density and confluent nodularity in the left upper lobe and superior segment left lower lobe observed with truncation of lingular airway branches due to a lingular masslike structure measuring approximately 1.8 cm in thickness, previously 2.0 cm in thickness. The bandlike density in the left upper lobe has a greater degree of associated consolidation, with more confluent airspace opacity in the superior segment left lower lobe on image 45/6 compared to the more patchy alveolar opacities shown previously. This confluent airspace opacity measures about 4.6 by 2.7 cm in the superior segment left lower lobe on image 45/6. However, the confluent left suprahilar density shown on the prior exam measuring about 2.0 cm transverse now measures only 1.1 cm transverse. Musculoskeletal: Sclerotic and centrally lucent lesion in the left proximal humerus measuring 1.6 cm in diameter on image 1/6, roughly similar to the prior exam. T7 hemangioma. CT ABDOMEN PELVIS FINDINGS Hepatobiliary: In general the scattered hypodense hepatic metastatic lesions are reduced in size compared to the 02/08/2018. For example a right hepatic lobe lesion on image 43/2 measures 2.1 by 2.4 cm, formerly 2.6 by 2.7 cm. A lesion in segment 2 of the liver measures 2.7 by 2.7 cm, formerly by my measurements 3.3 by 3.2 cm. The other lesions are commensurate leg reduced in size and no new lesions are identified. Gallbladder unremarkable.  No biliary dilatation. Pancreas: Unremarkable Spleen: Fluid density 9 mm splenic cystic lesion is identified on image 50/2 and appears stable. No splenomegaly. Adrenals/Urinary Tract: 0.9 by 1.1 cm right kidney lower  pole angiomyolipoma. 0.6 cm nonobstructive left mid kidney calculus. 2 mm right kidney lower pole nonobstructive renal calculus, image 64/2. No ureteral calculus, hydronephrosis, or hydroureter. Adrenal glands unremarkable. 3 mm linear calculus in the left kidney lower pole. Stomach/Bowel: Unremarkable Vascular/Lymphatic: Aortoiliac atherosclerotic vascular disease. No pathologic adenopathy identified. Reproductive: Uterus absent.  Adnexa unremarkable. Other: No supplemental non-categorized findings. Musculoskeletal: Degenerative subcortical cyst formation along the left acetabulum, stable. Grade 1 degenerative anterolisthesis at L4-5 with associated degenerative facet arthropathy, stable. IMPRESSION: 1. Reduced size of the hepatic metastatic lesions compared to 02/08/2018. 2. There is more confluent density in a bandlike configuration in the left upper lobe and superior segment left lower lobe, although the more focal left upper lobe suprahilar density is reduced in thickness, at 1.1 cm and previously 2.0 cm. Much of this could be from radiation pneumonitis. Strictly speaking, tumor involvement in the superior segment left lower lobe is not readily excluded. 3. Minimal reduction in size of the lingular nodule. 4. Other  imaging findings of potential clinical significance: Aortic Atherosclerosis (ICD10-I70.0). Ascending aortic ectasia. Reduce conspicuity of prior right pulmonary embolus. Bilateral nonobstructive nephrolithiasis. Stable sclerotic lesion in the left proximal humerus. Electronically Signed   By: Van Clines M.D.   On: 04/29/2018 15:22    ASSESSMENT AND PLAN:  This is a very pleasant 67 years old white female with a stage IV non-small cell lung cancer, adenocarcinoma with positive EGFR mutation in exon 21 (L858R) diagnosed in May 2015 status post treatment with Tarceva for 13 months discontinued secondary to disease progression and development of T790M resistant mutation. The patient is  currently undergoing treatment with Tagrisso 80 mg by mouth daily status post 34 months. I also referred the patient to Dr. Tammi Klippel from radiation oncology for consideration of palliative radiotherapy to the enlarging left upper lobe suprahilar mass to avoid further obstruction of her bronchial tree. Repeat CT scan of the chest, abdomen and pelvis showed findings for disease progression especially in the liver. The patient discontinued her treatment with Tagrisso and she was started on systemic chemotherapy with carboplatin for AUC of 6, paclitaxel 200 mg/M2, Avastin 15 mg/KG as well as Tecentriq (Atezolizumab) 1200 mg IV every 3 weeks status post 4 cycles.  She had reduced dose carboplatin and paclitaxel during cycle #4. The patient is not feeling well today and has sore throat and early flulike symptoms and she also has plan to travel to [place] to see her brother. I recommended for her to delay her treatment by 1 week until she feels better and also complete her trip to [place]. Starting from cycle #5 the patient will be on maintenance treatment with a Avastin 15 mg/KG as well as Tecentriq 1200 mg IV every 3 weeks.  First dose will be on May 30, 2018. I will see the patient back for follow-up visit in 4 weeks with the start of cycle #2. For the peripheral neuropathy, the patient on Neurontin 100 mg p.o. 3 times daily. For the back pain, I gave her a refill of Percocet. For the history of deep venous thrombosis and pulmonary embolus.  She is currently on treatment with Eliquis.  The patient was advised to call immediately if she has any concerning symptoms in the interval. The patient voices understanding of current disease status and treatment options and is in agreement with the current care plan. All questions were answered. The patient knows to call the clinic with any problems, questions or concerns. We can certainly see the patient much sooner if necessary.  Disclaimer: This note was  dictated with voice recognition software. Similar sounding words can inadvertently be transcribed and may not be corrected upon review.

## 2018-05-24 ENCOUNTER — Inpatient Hospital Stay: Payer: Medicare Other

## 2018-05-25 ENCOUNTER — Inpatient Hospital Stay: Payer: Medicare Other

## 2018-05-27 NOTE — Telephone Encounter (Signed)
AR out on PAL - following up.

## 2018-05-27 NOTE — Telephone Encounter (Signed)
Treatment added for 1/4 - 1/2 at capacity. Lab added for 1/3 - lab should be done with each tx. Left message for patient with my direct number re appointments for 1/3 and 1/4. Patient asked to call me back to confirm appointments. Patient will get an updated schedule at 1/3 visit.

## 2018-05-28 NOTE — Telephone Encounter (Signed)
Patient returned call this morning confirming appointments, however expressed concern re the need to repeat lab and make two trips. Patient informed I would check with MM and call her back.  Spoke with MM and per MM ok to cancel lab - patient can proceed with treatment 1/4. Left message for patient re cancellation of 1/3 lab appointment and confirming 1/4 infusion appointment. Patient provided my information to call back to confirm or if she has any questions.

## 2018-05-30 ENCOUNTER — Other Ambulatory Visit: Payer: Medicare Other

## 2018-05-31 ENCOUNTER — Telehealth: Payer: Self-pay | Admitting: Medical Oncology

## 2018-05-31 ENCOUNTER — Other Ambulatory Visit: Payer: Medicare Other

## 2018-05-31 NOTE — Telephone Encounter (Signed)
LVM on dtrs phone for pt to keep appt tomorrow and per [facility] labs from dec 26,2019 and urine from November are okay to use for her treatment tomorrow.

## 2018-05-31 NOTE — Telephone Encounter (Signed)
OK to treat-Per Dr Julien Nordmann it is okay to treat pt tomorrow 06/01/18, with Avastin and Tecentriq and labs from 12/26 and urine protein from 04/18/18.

## 2018-05-31 NOTE — Telephone Encounter (Signed)
Pt aware of appt tomorrow. 

## 2018-06-01 ENCOUNTER — Inpatient Hospital Stay: Payer: Medicare Other | Attending: Internal Medicine

## 2018-06-01 VITALS — BP 128/53 | HR 73 | Temp 98.2°F | Resp 18

## 2018-06-01 DIAGNOSIS — C787 Secondary malignant neoplasm of liver and intrahepatic bile duct: Secondary | ICD-10-CM | POA: Insufficient documentation

## 2018-06-01 DIAGNOSIS — Z800 Family history of malignant neoplasm of digestive organs: Secondary | ICD-10-CM | POA: Insufficient documentation

## 2018-06-01 DIAGNOSIS — C3492 Malignant neoplasm of unspecified part of left bronchus or lung: Secondary | ICD-10-CM

## 2018-06-01 DIAGNOSIS — Z5111 Encounter for antineoplastic chemotherapy: Secondary | ICD-10-CM | POA: Insufficient documentation

## 2018-06-01 DIAGNOSIS — Z808 Family history of malignant neoplasm of other organs or systems: Secondary | ICD-10-CM | POA: Insufficient documentation

## 2018-06-01 DIAGNOSIS — Z803 Family history of malignant neoplasm of breast: Secondary | ICD-10-CM | POA: Insufficient documentation

## 2018-06-01 DIAGNOSIS — R262 Difficulty in walking, not elsewhere classified: Secondary | ICD-10-CM | POA: Insufficient documentation

## 2018-06-01 DIAGNOSIS — R5383 Other fatigue: Secondary | ICD-10-CM | POA: Diagnosis not present

## 2018-06-01 MED ORDER — SODIUM CHLORIDE 0.9 % IV SOLN
Freq: Once | INTRAVENOUS | Status: AC
  Administered 2018-06-01: 08:00:00 via INTRAVENOUS
  Filled 2018-06-01: qty 250

## 2018-06-01 MED ORDER — SODIUM CHLORIDE 0.9 % IV SOLN
1200.0000 mg | Freq: Once | INTRAVENOUS | Status: AC
  Administered 2018-06-01: 1200 mg via INTRAVENOUS
  Filled 2018-06-01: qty 20

## 2018-06-01 MED ORDER — SODIUM CHLORIDE 0.9 % IV SOLN
14.3000 mg/kg | INTRAVENOUS | Status: DC
  Administered 2018-06-01: 1400 mg via INTRAVENOUS
  Filled 2018-06-01: qty 48

## 2018-06-01 NOTE — Patient Instructions (Signed)
[facility] Discharge Instructions for Patients Receiving Chemotherapy  Today you received the following chemotherapy agents :  Tecentriq, Avastin.  To help prevent nausea and vomiting after your treatment, we encourage you to take your nausea medication as prescribed.   If you develop nausea and vomiting that is not controlled by your nausea medication, call the clinic.   BELOW ARE SYMPTOMS THAT SHOULD BE REPORTED IMMEDIATELY:  *FEVER GREATER THAN 100.5 F  *CHILLS WITH OR WITHOUT FEVER  NAUSEA AND VOMITING THAT IS NOT CONTROLLED WITH YOUR NAUSEA MEDICATION  *UNUSUAL SHORTNESS OF BREATH  *UNUSUAL BRUISING OR BLEEDING  TENDERNESS IN MOUTH AND THROAT WITH OR WITHOUT PRESENCE OF ULCERS  *URINARY PROBLEMS  *BOWEL PROBLEMS  UNUSUAL RASH Items with * indicate a potential emergency and should be followed up as soon as possible.  Feel free to call the clinic should you have any questions or concerns. The clinic phone number is [phone].  Please show the [facility] at check-in to the Emergency Department and triage nurse.

## 2018-06-06 ENCOUNTER — Other Ambulatory Visit: Payer: Medicare Other

## 2018-06-13 ENCOUNTER — Encounter: Payer: Medicare Other | Admitting: Nutrition

## 2018-06-13 ENCOUNTER — Ambulatory Visit: Payer: Medicare Other

## 2018-06-13 ENCOUNTER — Other Ambulatory Visit: Payer: Medicare Other

## 2018-06-13 ENCOUNTER — Ambulatory Visit: Payer: Medicare Other | Admitting: Oncology

## 2018-06-15 ENCOUNTER — Ambulatory Visit: Payer: Medicare Other

## 2018-06-19 ENCOUNTER — Other Ambulatory Visit: Payer: Self-pay | Admitting: Internal Medicine

## 2018-06-20 ENCOUNTER — Inpatient Hospital Stay: Payer: Medicare Other

## 2018-06-20 ENCOUNTER — Encounter: Payer: Medicare Other | Admitting: Nutrition

## 2018-06-20 ENCOUNTER — Inpatient Hospital Stay: Payer: Medicare Other | Admitting: Nutrition

## 2018-06-20 ENCOUNTER — Inpatient Hospital Stay (HOSPITAL_BASED_OUTPATIENT_CLINIC_OR_DEPARTMENT_OTHER): Payer: Medicare Other | Admitting: Medical

## 2018-06-20 ENCOUNTER — Other Ambulatory Visit: Payer: Self-pay | Admitting: Emergency Medicine

## 2018-06-20 ENCOUNTER — Other Ambulatory Visit: Payer: Self-pay | Admitting: *Deleted

## 2018-06-20 ENCOUNTER — Other Ambulatory Visit: Payer: Medicare Other

## 2018-06-20 VITALS — BP 136/53 | HR 88 | Temp 97.6°F | Resp 16 | Ht 66.0 in | Wt 193.4 lb

## 2018-06-20 VITALS — BP 125/44

## 2018-06-20 DIAGNOSIS — Z800 Family history of malignant neoplasm of digestive organs: Secondary | ICD-10-CM | POA: Diagnosis not present

## 2018-06-20 DIAGNOSIS — C3492 Malignant neoplasm of unspecified part of left bronchus or lung: Secondary | ICD-10-CM

## 2018-06-20 DIAGNOSIS — Z803 Family history of malignant neoplasm of breast: Secondary | ICD-10-CM | POA: Diagnosis not present

## 2018-06-20 DIAGNOSIS — Z808 Family history of malignant neoplasm of other organs or systems: Secondary | ICD-10-CM | POA: Diagnosis not present

## 2018-06-20 DIAGNOSIS — C787 Secondary malignant neoplasm of liver and intrahepatic bile duct: Secondary | ICD-10-CM | POA: Diagnosis not present

## 2018-06-20 DIAGNOSIS — Z5111 Encounter for antineoplastic chemotherapy: Secondary | ICD-10-CM

## 2018-06-20 DIAGNOSIS — R262 Difficulty in walking, not elsewhere classified: Secondary | ICD-10-CM | POA: Diagnosis not present

## 2018-06-20 DIAGNOSIS — R5383 Other fatigue: Secondary | ICD-10-CM

## 2018-06-20 DIAGNOSIS — C3412 Malignant neoplasm of upper lobe, left bronchus or lung: Secondary | ICD-10-CM

## 2018-06-20 LAB — CBC WITH DIFFERENTIAL (CANCER CENTER ONLY)
Abs Immature Granulocytes: 0 10*3/uL (ref 0.00–0.07)
Basophils Absolute: 0 10*3/uL (ref 0.0–0.1)
Basophils Relative: 0 %
Eosinophils Absolute: 0.1 10*3/uL (ref 0.0–0.5)
Eosinophils Relative: 2 %
HCT: 32.8 % — ABNORMAL LOW (ref 36.0–46.0)
Hemoglobin: 10.9 g/dL — ABNORMAL LOW (ref 12.0–15.0)
Immature Granulocytes: 0 %
Lymphocytes Relative: 24 %
Lymphs Abs: 1.2 10*3/uL (ref 0.7–4.0)
MCH: 32.4 pg (ref 26.0–34.0)
MCHC: 33.2 g/dL (ref 30.0–36.0)
MCV: 97.6 fL (ref 80.0–100.0)
Monocytes Absolute: 0.6 10*3/uL (ref 0.1–1.0)
Monocytes Relative: 13 %
Neutro Abs: 2.9 10*3/uL (ref 1.7–7.7)
Neutrophils Relative %: 61 %
Platelet Count: 144 10*3/uL — ABNORMAL LOW (ref 150–400)
RBC: 3.36 MIL/uL — ABNORMAL LOW (ref 3.87–5.11)
RDW: 13.7 % (ref 11.5–15.5)
WBC Count: 4.8 10*3/uL (ref 4.0–10.5)
nRBC: 0 % (ref 0.0–0.2)

## 2018-06-20 LAB — CMP (CANCER CENTER ONLY)
ALBUMIN: 3.2 g/dL — AB (ref 3.5–5.0)
ALT: 26 U/L (ref 0–44)
AST: 34 U/L (ref 15–41)
Alkaline Phosphatase: 144 U/L — ABNORMAL HIGH (ref 38–126)
Anion gap: 8 (ref 5–15)
BUN: 11 mg/dL (ref 8–23)
CO2: 26 mmol/L (ref 22–32)
Calcium: 9.2 mg/dL (ref 8.9–10.3)
Chloride: 107 mmol/L (ref 98–111)
Creatinine: 0.63 mg/dL (ref 0.44–1.00)
GFR, Est AFR Am: >60 mL/min (ref >60)
GFR, Estimated: >60 mL/min (ref >60)
GLUCOSE: 103 mg/dL — AB (ref 70–99)
Potassium: 4 mmol/L (ref 3.5–5.1)
Sodium: 141 mmol/L (ref 135–145)
Total Bilirubin: 0.7 mg/dL (ref 0.3–1.2)
Total Protein: 7.1 g/dL (ref 6.5–8.1)

## 2018-06-20 LAB — TOTAL PROTEIN, URINE DIPSTICK: Protein, ur: NEGATIVE mg/dL

## 2018-06-20 MED ORDER — SODIUM CHLORIDE 0.9 % IV SOLN
Freq: Once | INTRAVENOUS | Status: AC
  Administered 2018-06-20: 10:00:00 via INTRAVENOUS
  Filled 2018-06-20: qty 250

## 2018-06-20 MED ORDER — SODIUM CHLORIDE 0.9 % IV SOLN
14.4000 mg/kg | INTRAVENOUS | Status: DC
  Administered 2018-06-20: 1400 mg via INTRAVENOUS
  Filled 2018-06-20: qty 8

## 2018-06-20 MED ORDER — SODIUM CHLORIDE 0.9 % IV SOLN
1200.0000 mg | Freq: Once | INTRAVENOUS | Status: AC
  Administered 2018-06-20: 1200 mg via INTRAVENOUS
  Filled 2018-06-20: qty 20

## 2018-06-20 NOTE — Progress Notes (Signed)
OK to treat.  Alora Gorey, MHS, PA-C 

## 2018-06-20 NOTE — Patient Instructions (Signed)
[facility] Discharge Instructions for Patients Receiving Chemotherapy  Today you received the following chemotherapy agents :  Tecentriq, Avastin.  To help prevent nausea and vomiting after your treatment, we encourage you to take your nausea medication as prescribed.   If you develop nausea and vomiting that is not controlled by your nausea medication, call the clinic.   BELOW ARE SYMPTOMS THAT SHOULD BE REPORTED IMMEDIATELY:  *FEVER GREATER THAN 100.5 F  *CHILLS WITH OR WITHOUT FEVER  NAUSEA AND VOMITING THAT IS NOT CONTROLLED WITH YOUR NAUSEA MEDICATION  *UNUSUAL SHORTNESS OF BREATH  *UNUSUAL BRUISING OR BLEEDING  TENDERNESS IN MOUTH AND THROAT WITH OR WITHOUT PRESENCE OF ULCERS  *URINARY PROBLEMS  *BOWEL PROBLEMS  UNUSUAL RASH Items with * indicate a potential emergency and should be followed up as soon as possible.  Feel free to call the clinic should you have any questions or concerns. The clinic phone number is [phone].  Please show the [facility] at check-in to the Emergency Department and triage nurse.

## 2018-06-20 NOTE — Progress Notes (Signed)
[facility] Progress Note   Debra Hodges [id number] 12/02/1950 68 y.o.  Debra Hodges is managed by Dr. Curt Bears  Actively treated with chemotherapy/immunotherapy/hormonal therapy: Yes  Current Therapy: Tecentriq and Avastin  Last Treated: 06/01/2018 (Cycle 5 Day 1)  Assessment: Plan:    Non-small cell carcinoma of left lung, stage 4 ([place]) - Plan: CT Chest W Contrast  Metastases to the liver [facility]) - Plan: CT Abdomen Pelvis W Contrast   1) Stage IV non small cell lung cancer: The patient presents for Cycle 6 Day 1 of Tecentriq and Avastin.  We will proceed with her treatment today.  She will return to see Dr. Julien Nordmann on 07/11/2018 and will have restaging CT scans completed prior to her return.   Please see After Visit Summary for patient specific instructions.  Future Appointments  Date Time Provider [place]  06/20/2018 12:00 PM Dalisa, Forrer, RD [facility] None  07/11/2018  9:00 AM [facility] LAB 4 [facility] None  07/11/2018  9:45 AM Curt Bears, MD [facility] None  07/11/2018 10:45 AM [facility] INFUSION [facility] None    Orders Placed This Encounter  Procedures  . CT Abdomen Pelvis W Contrast  . CT Chest W Contrast       Subjective:   Patient ID:  Debra Hodges is a 68 y.o. (DOB [birth year]) female.  Chief Complaint: No chief complaint on file.   HPI Debra Hodges is a 68 y.o. female with a stage IV NSC Lung cancer who is followed by Dr. Julien Nordmann and presents for cycle 6 Day 1 of Tecentriq and Avastin today.  She continues to have difficulty walking and has decreased energy.  She has occasional vomiting when she has excessive coughing.  Her appetite and back pain are both better.  Medications: I have reviewed the patient's current medications.  Allergies:  Allergies  Allergen Reactions  . Peanut-Containing Drug Products     Excessive mucous  . Penicillins     Hives, Childhood Allergy Has patient had a PCN  reaction causing immediate rash, facial/tongue/throat swelling, SOB or lightheadedness with hypotension: Yes Has patient had a PCN reaction causing severe rash involving mucus membranes or skin necrosis: No Has patient had a PCN reaction that required hospitalization: No Has patient had a PCN reaction occurring within the last 10 years: No If all of the above answers are "NO", then may proceed with Cephalosporin use.     Past Medical History:  Diagnosis Date  . High blood pressure   . Liver lesion 06/26/2016  . Non-small cell carcinoma of lung, stage 4 ([place]) dx'd 10/2013    Past Surgical History:  Procedure Laterality Date  . ABDOMINAL HYSTERECTOMY    . BREAST BIOPSY     Right breast  . VIDEO BRONCHOSCOPY Bilateral 10/27/2013   Procedure: VIDEO BRONCHOSCOPY WITH FLUORO;  Surgeon: Collene Gobble, MD;  Location: [facility];  Service: Cardiopulmonary;  Laterality: Bilateral;    Family History  Problem Relation Age of Onset  . High blood pressure Mother   . Cancer Mother        skin  . Cancer Father        esophageal  . Cancer Maternal Grandfather        breast    Social History   Socioeconomic History  . Marital status: Married    Spouse name: Not on file  . Number of children: Not on file  . Years of education: Not on file  . Highest education level: Not on file  Occupational History  . Occupation: Optometrist  Social Needs  . Financial resource strain: Not on file  . Food insecurity:    Worry: Not on file    Inability: Not on file  . Transportation needs:    Medical: Not on file    Non-medical: Not on file  Tobacco Use  . Smoking status: Never Smoker  . Smokeless tobacco: Never Used  Substance and Sexual Activity  . Alcohol use: No    Comment: social  . Drug use: No  . Sexual activity: Not Currently  Lifestyle  . Physical activity:    Days per week: Not on file    Minutes per session: Not on file  . Stress: Not on file  Relationships  . Social  connections:    Talks on phone: Not on file    Gets together: Not on file    Attends religious service: Not on file    Active member of club or organization: Not on file    Attends meetings of clubs or organizations: Not on file    Relationship status: Not on file  . Intimate partner violence:    Fear of current or ex partner: No    Emotionally abused: No    Physically abused: No    Forced sexual activity: No  Other Topics Concern  . Not on file  Social History Narrative  . Not on file    Past Medical History, Surgical history, Social history, and Family history were reviewed and updated as appropriate.   Please see review of systems for further details on the patient's review from today.   Review of Systems:  Review of Systems  Constitutional: Positive for fatigue. Negative for chills, diaphoresis and fever.  HENT: Negative for trouble swallowing and voice change.   Respiratory: Positive for cough. Negative for choking, chest tightness, shortness of breath, wheezing and stridor.   Cardiovascular: Negative for chest pain and palpitations.  Gastrointestinal: Positive for vomiting. Negative for abdominal pain, constipation, diarrhea and nausea.  Musculoskeletal: Positive for back pain and gait problem. Negative for myalgias.  Neurological: Negative for dizziness, light-headedness and headaches.    Objective:   Physical Exam:  BP (!) 136/53 (BP Location: Left Arm, Patient Position: Sitting) Comment: Notified Nurse of Bp  Pulse 88   Temp 97.6 F (36.4 C) (Oral)   Resp 16   Ht 5\' 6"  (1.676 m)   Wt 193 lb 6.4 oz (87.7 kg)   SpO2 99%   BMI 31.22 kg/m  ECOG: 1  Physical Exam Constitutional:      General: She is not in acute distress.    Appearance: She is not diaphoretic.  HENT:     Head: Normocephalic and atraumatic.  Cardiovascular:     Rate and Rhythm: Normal rate and regular rhythm.     Heart sounds: Normal heart sounds. No murmur. No friction rub. No gallop.     Pulmonary:     Effort: Pulmonary effort is normal. No respiratory distress.     Breath sounds: Normal breath sounds. No wheezing or rales.  Skin:    General: Skin is warm and dry.     Findings: No erythema or rash.  Neurological:     Mental Status: She is alert.     Gait: Gait abnormal (The patient is ambulating with the use of a wheelchair.).  Psychiatric:        Mood and Affect: Mood normal.        Behavior: Behavior normal.  Thought Content: Thought content normal.        Judgment: Judgment normal.     Lab Review:     Component Value Date/Time   NA 141 06/20/2018 0856   NA 141 04/30/2017 1541   K 4.0 06/20/2018 0856   K 4.1 04/30/2017 1541   CL 107 06/20/2018 0856   CO2 26 06/20/2018 0856   CO2 26 04/30/2017 1541   GLUCOSE 103 (H) 06/20/2018 0856   GLUCOSE 84 04/30/2017 1541   BUN 11 06/20/2018 0856   BUN 14.1 04/30/2017 1541   CREATININE 0.63 06/20/2018 0856   CREATININE 0.8 04/30/2017 1541   CALCIUM 9.2 06/20/2018 0856   CALCIUM 9.6 04/30/2017 1541   PROT 7.1 06/20/2018 0856   PROT 8.1 04/30/2017 1541   ALBUMIN 3.2 (L) 06/20/2018 0856   ALBUMIN 3.8 04/30/2017 1541   AST 34 06/20/2018 0856   AST 40 (H) 04/30/2017 1541   ALT 26 06/20/2018 0856   ALT 27 04/30/2017 1541   ALKPHOS 144 (H) 06/20/2018 0856   ALKPHOS 105 04/30/2017 1541   BILITOT 0.7 06/20/2018 0856   BILITOT 0.55 04/30/2017 1541   GFRNONAA >60 06/20/2018 0856   GFRAA >60 06/20/2018 0856       Component Value Date/Time   WBC 4.8 06/20/2018 0856   WBC 3.8 (L) 04/20/2018 0457   RBC 3.36 (L) 06/20/2018 0856   HGB 10.9 (L) 06/20/2018 0856   HGB 12.7 04/30/2017 1541   HCT 32.8 (L) 06/20/2018 0856   HCT 38.4 04/30/2017 1541   PLT 144 (L) 06/20/2018 0856   PLT 123 (L) 04/30/2017 1541   MCV 97.6 06/20/2018 0856   MCV 89.9 04/30/2017 1541   MCH 32.4 06/20/2018 0856   MCHC 33.2 06/20/2018 0856   RDW 13.7 06/20/2018 0856   RDW 14.7 (H) 04/30/2017 1541   LYMPHSABS 1.2 06/20/2018 0856    LYMPHSABS 1.0 04/30/2017 1541   MONOABS 0.6 06/20/2018 0856   MONOABS 0.6 04/30/2017 1541   EOSABS 0.1 06/20/2018 0856   EOSABS 0.1 04/30/2017 1541   BASOSABS 0.0 06/20/2018 0856   BASOSABS 0.0 04/30/2017 1541   -------------------------------  Imaging from last 24 hours (if applicable):  Radiology interpretation: No results found.      This case was discussed with Dr. Julien Nordmann. He expressed agreement with my management of this patient.

## 2018-06-20 NOTE — Progress Notes (Signed)
Pt seen by Pa Lucianne Lei only, no RN assessment at this time. PA aware.

## 2018-06-20 NOTE — Progress Notes (Signed)
Nutrition follow-up completed with patient during infusion for non-small cell lung cancer with liver metastases. Weight continues to trend down and was documented as 193.4 pounds January 23. Patient states she knows she is eating better.   She is drinking 1 Ensure original every day. She has no nutrition impact symptoms.  Nutrition diagnosis: Unintended weight loss continues.  Intervention: Recommended patient continue strategies for increasing calorie and protein intake.   Provided coupons for Ensure and reminded patient to drink 1-2 bottles daily.  Monitoring, evaluation, goals: Patient will continue to work to increase calories and protein to minimize weight loss.  Next visit: To be scheduled as needed.  **Disclaimer: This note was dictated with voice recognition software. Similar sounding words can inadvertently be transcribed and this note may contain transcription errors which may not have been corrected upon publication of note.**

## 2018-06-27 ENCOUNTER — Other Ambulatory Visit: Payer: Medicare Other

## 2018-07-04 ENCOUNTER — Other Ambulatory Visit: Payer: Medicare Other

## 2018-07-04 ENCOUNTER — Ambulatory Visit: Payer: Medicare Other | Admitting: Internal Medicine

## 2018-07-04 ENCOUNTER — Ambulatory Visit: Payer: Medicare Other

## 2018-07-08 ENCOUNTER — Encounter (HOSPITAL_COMMUNITY): Payer: Self-pay

## 2018-07-08 ENCOUNTER — Ambulatory Visit (HOSPITAL_COMMUNITY)
Admission: RE | Admit: 2018-07-08 | Discharge: 2018-07-08 | Disposition: A | Discharge status: Home / Self Care | Payer: Medicare Other | Source: Ambulatory Visit | Attending: Medical | Admitting: Medical

## 2018-07-08 DIAGNOSIS — C3492 Malignant neoplasm of unspecified part of left bronchus or lung: Secondary | ICD-10-CM | POA: Insufficient documentation

## 2018-07-08 DIAGNOSIS — C3490 Malignant neoplasm of unspecified part of unspecified bronchus or lung: Secondary | ICD-10-CM | POA: Diagnosis not present

## 2018-07-08 DIAGNOSIS — C787 Secondary malignant neoplasm of liver and intrahepatic bile duct: Secondary | ICD-10-CM | POA: Diagnosis not present

## 2018-07-08 MED ORDER — IOHEXOL 300 MG/ML  SOLN
100.0000 mL | Freq: Once | INTRAMUSCULAR | Status: AC | PRN
  Administered 2018-07-08: 100 mL via INTRAVENOUS

## 2018-07-08 MED ORDER — SODIUM CHLORIDE (PF) 0.9 % IJ SOLN
INTRAMUSCULAR | Status: AC
  Filled 2018-07-08: qty 50

## 2018-07-10 ENCOUNTER — Other Ambulatory Visit: Payer: Self-pay | Admitting: Medical Oncology

## 2018-07-10 DIAGNOSIS — C3492 Malignant neoplasm of unspecified part of left bronchus or lung: Secondary | ICD-10-CM

## 2018-07-11 ENCOUNTER — Inpatient Hospital Stay (HOSPITAL_BASED_OUTPATIENT_CLINIC_OR_DEPARTMENT_OTHER): Payer: Medicare Other | Admitting: Internal Medicine

## 2018-07-11 ENCOUNTER — Inpatient Hospital Stay: Payer: Medicare Other | Attending: Internal Medicine

## 2018-07-11 ENCOUNTER — Telehealth: Payer: Self-pay

## 2018-07-11 ENCOUNTER — Inpatient Hospital Stay: Payer: Medicare Other

## 2018-07-11 VITALS — BP 132/71 | HR 91 | Temp 97.9°F | Resp 18 | Ht 66.0 in | Wt 189.9 lb

## 2018-07-11 DIAGNOSIS — N200 Calculus of kidney: Secondary | ICD-10-CM | POA: Diagnosis not present

## 2018-07-11 DIAGNOSIS — D7389 Other diseases of spleen: Secondary | ICD-10-CM | POA: Insufficient documentation

## 2018-07-11 DIAGNOSIS — I700 Atherosclerosis of aorta: Secondary | ICD-10-CM

## 2018-07-11 DIAGNOSIS — C3412 Malignant neoplasm of upper lobe, left bronchus or lung: Secondary | ICD-10-CM | POA: Insufficient documentation

## 2018-07-11 DIAGNOSIS — Z79899 Other long term (current) drug therapy: Secondary | ICD-10-CM | POA: Insufficient documentation

## 2018-07-11 DIAGNOSIS — Z86711 Personal history of pulmonary embolism: Secondary | ICD-10-CM | POA: Diagnosis not present

## 2018-07-11 DIAGNOSIS — Z7901 Long term (current) use of anticoagulants: Secondary | ICD-10-CM | POA: Insufficient documentation

## 2018-07-11 DIAGNOSIS — E039 Hypothyroidism, unspecified: Secondary | ICD-10-CM

## 2018-07-11 DIAGNOSIS — K769 Liver disease, unspecified: Secondary | ICD-10-CM

## 2018-07-11 DIAGNOSIS — C3492 Malignant neoplasm of unspecified part of left bronchus or lung: Secondary | ICD-10-CM

## 2018-07-11 DIAGNOSIS — I10 Essential (primary) hypertension: Secondary | ICD-10-CM | POA: Insufficient documentation

## 2018-07-11 DIAGNOSIS — G629 Polyneuropathy, unspecified: Secondary | ICD-10-CM | POA: Diagnosis not present

## 2018-07-11 DIAGNOSIS — Z5112 Encounter for antineoplastic immunotherapy: Secondary | ICD-10-CM | POA: Insufficient documentation

## 2018-07-11 DIAGNOSIS — C787 Secondary malignant neoplasm of liver and intrahepatic bile duct: Secondary | ICD-10-CM | POA: Diagnosis not present

## 2018-07-11 DIAGNOSIS — J029 Acute pharyngitis, unspecified: Secondary | ICD-10-CM | POA: Insufficient documentation

## 2018-07-11 DIAGNOSIS — R5382 Chronic fatigue, unspecified: Secondary | ICD-10-CM

## 2018-07-11 DIAGNOSIS — I2699 Other pulmonary embolism without acute cor pulmonale: Secondary | ICD-10-CM

## 2018-07-11 DIAGNOSIS — Z5111 Encounter for antineoplastic chemotherapy: Secondary | ICD-10-CM

## 2018-07-11 LAB — CMP (CANCER CENTER ONLY)
ALBUMIN: 3.4 g/dL — AB (ref 3.5–5.0)
ALT: 21 U/L (ref 0–44)
AST: 31 U/L (ref 15–41)
Alkaline Phosphatase: 131 U/L — ABNORMAL HIGH (ref 38–126)
Anion gap: 9 (ref 5–15)
BILIRUBIN TOTAL: 0.8 mg/dL (ref 0.3–1.2)
BUN: 11 mg/dL (ref 8–23)
CO2: 23 mmol/L (ref 22–32)
Calcium: 9.2 mg/dL (ref 8.9–10.3)
Chloride: 109 mmol/L (ref 98–111)
Creatinine: 0.65 mg/dL (ref 0.44–1.00)
GFR, Est AFR Am: >60 mL/min (ref >60)
GFR, Estimated: >60 mL/min (ref >60)
GLUCOSE: 101 mg/dL — AB (ref 70–99)
Potassium: 3.5 mmol/L (ref 3.5–5.1)
Sodium: 141 mmol/L (ref 135–145)
Total Protein: 7.1 g/dL (ref 6.5–8.1)

## 2018-07-11 LAB — CBC WITH DIFFERENTIAL (CANCER CENTER ONLY)
ABS IMMATURE GRANULOCYTES: 0 10*3/uL (ref 0.00–0.07)
Basophils Absolute: 0 10*3/uL (ref 0.0–0.1)
Basophils Relative: 0 %
Eosinophils Absolute: 0.1 10*3/uL (ref 0.0–0.5)
Eosinophils Relative: 3 %
HCT: 37.4 % (ref 36.0–46.0)
Hemoglobin: 12 g/dL (ref 12.0–15.0)
Immature Granulocytes: 0 %
Lymphocytes Relative: 31 %
Lymphs Abs: 1.4 10*3/uL (ref 0.7–4.0)
MCH: 31.7 pg (ref 26.0–34.0)
MCHC: 32.1 g/dL (ref 30.0–36.0)
MCV: 98.9 fL (ref 80.0–100.0)
Monocytes Absolute: 0.6 10*3/uL (ref 0.1–1.0)
Monocytes Relative: 13 %
Neutro Abs: 2.3 10*3/uL (ref 1.7–7.7)
Neutrophils Relative %: 53 %
Platelet Count: 133 10*3/uL — ABNORMAL LOW (ref 150–400)
RBC: 3.78 MIL/uL — ABNORMAL LOW (ref 3.87–5.11)
RDW: 13.3 % (ref 11.5–15.5)
WBC Count: 4.4 10*3/uL (ref 4.0–10.5)
nRBC: 0 % (ref 0.0–0.2)

## 2018-07-11 LAB — TSH: TSH: <0.08 u[IU]/mL — ABNORMAL LOW (ref 0.308–3.960)

## 2018-07-11 MED ORDER — SODIUM CHLORIDE 0.9 % IV SOLN
14.3000 mg/kg | INTRAVENOUS | Status: DC
  Administered 2018-07-11: 1400 mg via INTRAVENOUS
  Filled 2018-07-11: qty 48

## 2018-07-11 MED ORDER — SODIUM CHLORIDE 0.9 % IV SOLN
1200.0000 mg | Freq: Once | INTRAVENOUS | Status: AC
  Administered 2018-07-11: 1200 mg via INTRAVENOUS
  Filled 2018-07-11: qty 20

## 2018-07-11 MED ORDER — SODIUM CHLORIDE 0.9 % IV SOLN
Freq: Once | INTRAVENOUS | Status: AC
  Administered 2018-07-11: 11:00:00 via INTRAVENOUS
  Filled 2018-07-11: qty 250

## 2018-07-11 NOTE — Telephone Encounter (Signed)
Printed avs and calender of upcoming appointment. Per 2/13 los 

## 2018-07-11 NOTE — Progress Notes (Signed)
[place] [phone]   [phone]  OFFICE PROGRESS NOTE  System, Pcp Not In No address on file  DIAGNOSIS: Stage IV (T1b, N3, M1b) non-small cell lung cancer, adenocarcinoma with positive EGFR mutation in exon 21 (L858R) and recently found to have T790M resistant mutation, presented with left upper lobe lung mass in addition to left hilar and bilateral mediastinal lymphadenopathy as well as liver metastasis diagnosed in May of 2015.  PRIOR THERAPY: 1) Tarceva 150 mg by mouth daily. Started 11/25/2013. Status post 5 months of treatment discontinued today secondary to intolerance with persistent paronychia. 2) Tarceva 100 mg by mouth daily. Started 06/03/2014. Status post 8 months of treatment.  3) stereotactic radiotherapy to liver lesions under the care of Dr. Tammi Klippel. Last dose 03/02/2017 4) Tagrisso 80 mg by mouth daily status post 34 months of treatment. First dose was given 03/04/2015.  This was discontinued secondary to disease progression. 5) Systemic chemotherapy with carboplatin for AUC of 6, paclitaxel 200 mg/M2, Avastin 15 mg/KG and Tecentriq 1200 mg IV every 3 weeks with Neulasta support.  First dose 02/21/2018.  Status post 4 cycles.  Starting from cycle #4 the dose of carboplatin will be for AUC of 5 and paclitaxel 150 mg/M2.   CURRENT THERAPY: Maintenance treatment with Avastin 15 mg/KG and Tecentriq 1200 mg IV every 3 weeks.  First cycle May 30, 2018.  Status post 2 cycles.  INTERVAL HISTORY: Debra Hodges 68 y.o. female returns to the clinic today for follow-up visit accompanied by her husband.  The patient is feeling fine today with no concerning complaints except for the persistent peripheral neuropathy from the previous chemotherapy as well as occasional nausea.  She denied having any current chest pain, shortness of breath, cough or hemoptysis.  She denied having any fever or chills.  She has no nausea, vomiting, diarrhea or  constipation.  She denied having any headache or visual changes.  She has been tolerating her maintenance treatment with Avastin and Tecentriq fairly well.  The patient had repeat CT scan of the chest, abdomen and pelvis performed recently and she is here for evaluation and discussion of her scan results.   MEDICAL HISTORY: Past Medical History:  Diagnosis Date  . High blood pressure   . Liver lesion 06/26/2016  . Non-small cell carcinoma of lung, stage 4 ([place]) dx'd 10/2013    ALLERGIES:  is allergic to peanut-containing drug products and penicillins.  MEDICATIONS:  Current Outpatient Medications  Medication Sig Dispense Refill  . acetaminophen (TYLENOL) 325 MG tablet Take 650 mg by mouth every 6 (six) hours as needed for mild pain. Reported on 06/15/2015    . apixaban (ELIQUIS) 5 MG TABS tablet Take 1 tablet (5 mg total) by mouth 2 (two) times daily. 60 tablet 2  . calcium carbonate (OS-CAL) 600 MG TABS tablet Take 600 mg by mouth daily.     Marland Kitchen CLIMARA 0.1 MG/24HR patch Place 1 patch onto the skin once a week. On Thursday    . gabapentin (NEURONTIN) 100 MG capsule TAKE 3 CAPSULES(300 MG) BY MOUTH THREE TIMES DAILY 90 capsule 1  . levothyroxine (SYNTHROID, LEVOTHROID) 50 MCG tablet Take 50 mcg by mouth at bedtime.     Marland Kitchen loperamide (IMODIUM) 2 MG capsule Take 2 mg by mouth daily as needed for diarrhea or loose stools. Reported on 11/22/2015    . loratadine (CLARITIN) 10 MG tablet Take 10 mg by mouth daily.    . Multiple Vitamins-Calcium (ONE-A-DAY  WOMENS PO) Take 1 tablet by mouth daily.    Marland Kitchen oxyCODONE-acetaminophen (PERCOCET/ROXICET) 5-325 MG tablet Take 1 tablet by mouth every 6 (six) hours as needed for severe pain. 60 tablet 0  . potassium chloride SA (K-DUR,KLOR-CON) 20 MEQ tablet Take 20 mEq by mouth at bedtime.     . prochlorperazine (COMPAZINE) 10 MG tablet TAKE 1 TABLET(10 MG) BY MOUTH EVERY 6 HOURS AS NEEDED FOR NAUSEA OR VOMITING (Patient taking differently: Take 10 mg by mouth every  6 (six) hours as needed for nausea or vomiting. ) 385 tablet 3  . Sodium Fluoride (CLINPRO 5000) 1.1 % PSTE Place 1 application onto teeth 2 (two) times daily.     Marland Kitchen spironolactone (ALDACTONE) 25 MG tablet Take 25 mg by mouth daily.     . vitamin C (ASCORBIC ACID) 500 MG tablet Take 500 mg by mouth daily.     No current facility-administered medications for this visit.     SURGICAL HISTORY:  Past Surgical History:  Procedure Laterality Date  . ABDOMINAL HYSTERECTOMY    . BREAST BIOPSY     Right breast  . VIDEO BRONCHOSCOPY Bilateral 10/27/2013   Procedure: VIDEO BRONCHOSCOPY WITH FLUORO;  Surgeon: Collene Gobble, MD;  Location: [facility];  Service: Cardiopulmonary;  Laterality: Bilateral;    REVIEW OF SYSTEMS:  Constitutional: positive for fatigue Eyes: negative Ears, nose, mouth, throat, and face: negative Respiratory: negative Cardiovascular: negative Gastrointestinal: negative Genitourinary:negative Integument/breast: negative Hematologic/lymphatic: negative Musculoskeletal:positive for arthralgias Neurological: positive for paresthesia Behavioral/Psych: negative Endocrine: negative Allergic/Immunologic: negative   PHYSICAL EXAMINATION: General appearance: alert, cooperative, fatigued and no distress Head: Normocephalic, without obvious abnormality, atraumatic Neck: no adenopathy, no JVD, supple, symmetrical, trachea midline and thyroid not enlarged, symmetric, no tenderness/mass/nodules Lymph nodes: Cervical, supraclavicular, and axillary nodes normal. Resp: clear to auscultation bilaterally Back: symmetric, no curvature. ROM normal. No CVA tenderness. Cardio: regular rate and rhythm, S1, S2 normal, no murmur, click, rub or gallop GI: soft, non-tender; bowel sounds normal; no masses,  no organomegaly Extremities: extremities normal, atraumatic, no cyanosis or edema Neurologic: Alert and oriented X 3, normal strength and tone. Normal symmetric reflexes. Normal  coordination and gait  ECOG PERFORMANCE STATUS: 1 - Symptomatic but completely ambulatory  Blood pressure 132/71, pulse 91, temperature 97.9 F (36.6 C), temperature source Oral, resp. rate 18, height '5\' 6"'$  (1.676 m), weight 189 lb 14.4 oz (86.1 kg), SpO2 98 %.  LABORATORY DATA: Lab Results  Component Value Date   WBC 4.4 07/11/2018   HGB 12.0 07/11/2018   HCT 37.4 07/11/2018   MCV 98.9 07/11/2018   PLT 133 (L) 07/11/2018      Chemistry      Component Value Date/Time   NA 141 07/11/2018 0845   NA 141 04/30/2017 1541   K 3.5 07/11/2018 0845   K 4.1 04/30/2017 1541   CL 109 07/11/2018 0845   CO2 23 07/11/2018 0845   CO2 26 04/30/2017 1541   BUN 11 07/11/2018 0845   BUN 14.1 04/30/2017 1541   CREATININE 0.65 07/11/2018 0845   CREATININE 0.8 04/30/2017 1541      Component Value Date/Time   CALCIUM 9.2 07/11/2018 0845   CALCIUM 9.6 04/30/2017 1541   ALKPHOS 131 (H) 07/11/2018 0845   ALKPHOS 105 04/30/2017 1541   AST 31 07/11/2018 0845   AST 40 (H) 04/30/2017 1541   ALT 21 07/11/2018 0845   ALT 27 04/30/2017 1541   BILITOT 0.8 07/11/2018 0845   BILITOT 0.55 04/30/2017 1541  RADIOGRAPHIC STUDIES: Ct Chest W Contrast  Result Date: 07/08/2018 CLINICAL DATA:  Non-small-cell lung cancer with liver metastasis. Ongoing oral chemotherapy. Radiation therapy to lung and liver. Short of breath on exertion. Nausea and vomiting. EXAM: CT CHEST, ABDOMEN, AND PELVIS WITH CONTRAST TECHNIQUE: Multidetector CT imaging of the chest, abdomen and pelvis was performed following the standard protocol during bolus administration of intravenous contrast. CONTRAST:  118m OMNIPAQUE IOHEXOL 300 MG/ML  SOLN COMPARISON:  04/29/2018 FINDINGS: CT CHEST FINDINGS Cardiovascular: Aortic atherosclerosis. Normal heart size, without pericardial effusion. No central pulmonary embolism, on this non-dedicated study. Mediastinum/Nodes: Bilateral small thyroid nodules are nonspecific. No supraclavicular  adenopathy. No mediastinal or hilar adenopathy. Lungs/Pleura: No pleural fluid. Medial left upper and superior segment left lower lobe areas of consolidation and air bronchograms are relatively similar, without well-defined dominant locally recurrent disease. Musculoskeletal: No acute osseous abnormality. A left humeral head sclerotic lesion is most likely an enchondroma and is similar to on the prior at 1.1 cm. CT ABDOMEN PELVIS FINDINGS Hepatobiliary: Bilateral hepatic metastasis. Index high right hepatic lobe lesion measures 1.5 x 1.4 cm on image 49/2 versus 2.1 x 2.4 cm previously. Segment 2 lesion measures 1.7 x 1.6 cm on image 47/2 versus 2.7 x 2.7 cm on the prior exam. No enlarging lesions. Normal gallbladder, without biliary ductal dilatation. Pancreas: Normal, without mass or ductal dilatation. Spleen: A subcapsular hypoattenuating 9 mm splenic lesion is similar and of doubtful clinical significance. Adrenals/Urinary Tract: Normal adrenal glands. Left renal collecting system calculi of up to 5 mm. 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 Bilateral too small to characterize renal lesions. Normal urinary bladder. Stomach/Bowel: Normal stomach, without wall thickening. Normal colon and terminal ileum. Normal small bowel. Vascular/Lymphatic: Aortic and branch vessel atherosclerosis. No abdominopelvic adenopathy. Reproductive: Hysterectomy.  No adnexal mass. Other: No significant free fluid. Mild pelvic floor laxity. No evidence of omental or peritoneal disease. Musculoskeletal: No acute osseous abnormality. IMPRESSION: 1. Improvement in hepatic metastasis. No new or progressive disease. 2. Similar areas of consolidation in the left upper and superior segment left lower lobe, likely radiation induced. 3.  Aortic Atherosclerosis (ICD10-I70.0). 4. Left nephrolithiasis. Electronically Signed   By: KAbigail MiyamotoM.D.   On: 07/08/2018 20:53   Ct Abdomen Pelvis W Contrast  Result Date: 07/08/2018 CLINICAL DATA:   Non-small-cell lung cancer with liver metastasis. Ongoing oral chemotherapy. Radiation therapy to lung and liver. Short of breath on exertion. Nausea and vomiting. EXAM: CT CHEST, ABDOMEN, AND PELVIS WITH CONTRAST TECHNIQUE: Multidetector CT imaging of the chest, abdomen and pelvis was performed following the standard protocol during bolus administration of intravenous contrast. CONTRAST:  1021mOMNIPAQUE IOHEXOL 300 MG/ML  SOLN COMPARISON:  04/29/2018 FINDINGS: CT CHEST FINDINGS Cardiovascular: Aortic atherosclerosis. Normal heart size, without pericardial effusion. No central pulmonary embolism, on this non-dedicated study. Mediastinum/Nodes: Bilateral small thyroid nodules are nonspecific. No supraclavicular adenopathy. No mediastinal or hilar adenopathy. Lungs/Pleura: No pleural fluid. Medial left upper and superior segment left lower lobe areas of consolidation and air bronchograms are relatively similar, without well-defined dominant locally recurrent disease. Musculoskeletal: No acute osseous abnormality. A left humeral head sclerotic lesion is most likely an enchondroma and is similar to on the prior at 1.1 cm. CT ABDOMEN PELVIS FINDINGS Hepatobiliary: Bilateral hepatic metastasis. Index high right hepatic lobe lesion measures 1.5 x 1.4 cm on image 49/2 versus 2.1 x 2.4 cm previously. Segment 2 lesion measures 1.7 x 1.6 cm on image 47/2 versus 2.7 x 2.7 cm on the prior exam.  No enlarging lesions. Normal gallbladder, without biliary ductal dilatation. Pancreas: Normal, without mass or ductal dilatation. Spleen: A subcapsular hypoattenuating 9 mm splenic lesion is similar and of doubtful clinical significance. Adrenals/Urinary Tract: Normal adrenal glands. Left renal collecting system calculi of up to 5 mm. 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 Bilateral too small to characterize renal lesions. Normal urinary bladder. Stomach/Bowel: Normal stomach, without wall thickening. Normal colon and terminal  ileum. Normal small bowel. Vascular/Lymphatic: Aortic and branch vessel atherosclerosis. No abdominopelvic adenopathy. Reproductive: Hysterectomy.  No adnexal mass. Other: No significant free fluid. Mild pelvic floor laxity. No evidence of omental or peritoneal disease. Musculoskeletal: No acute osseous abnormality. IMPRESSION: 1. Improvement in hepatic metastasis. No new or progressive disease. 2. Similar areas of consolidation in the left upper and superior segment left lower lobe, likely radiation induced. 3.  Aortic Atherosclerosis (ICD10-I70.0). 4. Left nephrolithiasis. Electronically Signed   By: Abigail Miyamoto M.D.   On: 07/08/2018 20:53    ASSESSMENT AND PLAN:  This is a very pleasant 68 years old white female with a stage IV non-small cell lung cancer, adenocarcinoma with positive EGFR mutation in exon 21 (L858R) diagnosed in May 2015 status post treatment with Tarceva for 13 months discontinued secondary to disease progression and development of T790M resistant mutation. The patient is currently undergoing treatment with Tagrisso 80 mg by mouth daily status post 34 months. I also referred the patient to Dr. Tammi Klippel from radiation oncology for consideration of palliative radiotherapy to the enlarging left upper lobe suprahilar mass to avoid further obstruction of her bronchial tree. Repeat CT scan of the chest, abdomen and pelvis showed findings for disease progression especially in the liver. The patient discontinued her treatment with Tagrisso and she was started on systemic chemotherapy with carboplatin for AUC of 6, paclitaxel 200 mg/M2, Avastin 15 mg/KG as well as Tecentriq (Atezolizumab) 1200 mg IV every 3 weeks status post 4 cycles.  She had reduced dose carboplatin and paclitaxel during cycle #4. The patient is not feeling well today and has sore throat and early flulike symptoms and she also has plan to travel to [place] to see her brother. I recommended for her to delay her treatment by  1 week until she feels better and also complete her trip to [place]. Starting from cycle #5 the patient will be on maintenance treatment with a Avastin 15 mg/KG as well as Tecentriq 1200 mg IV every 3 weeks.  First dose will be on May 30, 2018.  She is status post 2 cycles of the maintenance phase of her treatment. The patient continues to tolerate this treatment well. She had repeat CT scan of the chest, abdomen and pelvis performed recently.  I personally and independently reviewed the scans and discussed the results with the patient and her husband. Her scan showed improvement of the liver metastasis with no concerning areas of progression. I recommended for her to proceed with cycle #3 today as scheduled. I will see her back for follow-up visit in 3 weeks for evaluation before the next cycle of her treatment. For the peripheral neuropathy, the patient on Neurontin 100 mg p.o. 3 times daily. For the history of pulmonary embolism, she will continue on her current treatment with Eliquis.  The patient has some financial issues with the copayment for this medication and she has to pay $500 monthly.  I asked her to check with the manufacturer for copayment coupons or switch to Xarelto if it is a cheaper for  the patient. She was advised to call immediately if she has any concerning symptoms in the interval. The patient voices understanding of current disease status and treatment options and is in agreement with the current care plan. All questions were answered. The patient knows to call the clinic with any problems, questions or concerns. We can certainly see the patient much sooner if necessary.  Disclaimer: This note was dictated with voice recognition software. Similar sounding words can inadvertently be transcribed and may not be corrected upon review.

## 2018-07-16 ENCOUNTER — Telehealth: Payer: Self-pay | Admitting: Internal Medicine

## 2018-07-16 NOTE — Telephone Encounter (Signed)
Scheduled appt 3/4 log - pt is aware of apt added.

## 2018-07-30 ENCOUNTER — Other Ambulatory Visit: Payer: Self-pay | Admitting: Medical Oncology

## 2018-07-30 DIAGNOSIS — C3492 Malignant neoplasm of unspecified part of left bronchus or lung: Secondary | ICD-10-CM

## 2018-07-31 ENCOUNTER — Other Ambulatory Visit: Payer: Self-pay

## 2018-07-31 ENCOUNTER — Telehealth: Payer: Self-pay | Admitting: Physician Assistant

## 2018-07-31 ENCOUNTER — Inpatient Hospital Stay: Payer: Medicare Other

## 2018-07-31 ENCOUNTER — Encounter: Payer: Self-pay | Admitting: Physician Assistant

## 2018-07-31 ENCOUNTER — Inpatient Hospital Stay: Payer: Medicare Other | Attending: Internal Medicine | Admitting: Physician Assistant

## 2018-07-31 VITALS — BP 161/69 | HR 72

## 2018-07-31 VITALS — BP 141/60 | HR 71 | Temp 98.6°F | Resp 18 | Ht 66.0 in | Wt 192.4 lb

## 2018-07-31 DIAGNOSIS — C3412 Malignant neoplasm of upper lobe, left bronchus or lung: Secondary | ICD-10-CM | POA: Diagnosis not present

## 2018-07-31 DIAGNOSIS — C787 Secondary malignant neoplasm of liver and intrahepatic bile duct: Secondary | ICD-10-CM | POA: Insufficient documentation

## 2018-07-31 DIAGNOSIS — E042 Nontoxic multinodular goiter: Secondary | ICD-10-CM | POA: Insufficient documentation

## 2018-07-31 DIAGNOSIS — Z7901 Long term (current) use of anticoagulants: Secondary | ICD-10-CM | POA: Diagnosis not present

## 2018-07-31 DIAGNOSIS — R5382 Chronic fatigue, unspecified: Secondary | ICD-10-CM

## 2018-07-31 DIAGNOSIS — C3492 Malignant neoplasm of unspecified part of left bronchus or lung: Secondary | ICD-10-CM

## 2018-07-31 DIAGNOSIS — Z923 Personal history of irradiation: Secondary | ICD-10-CM | POA: Insufficient documentation

## 2018-07-31 DIAGNOSIS — G629 Polyneuropathy, unspecified: Secondary | ICD-10-CM | POA: Insufficient documentation

## 2018-07-31 DIAGNOSIS — Z9221 Personal history of antineoplastic chemotherapy: Secondary | ICD-10-CM | POA: Insufficient documentation

## 2018-07-31 DIAGNOSIS — Z86711 Personal history of pulmonary embolism: Secondary | ICD-10-CM | POA: Insufficient documentation

## 2018-07-31 DIAGNOSIS — C778 Secondary and unspecified malignant neoplasm of lymph nodes of multiple regions: Secondary | ICD-10-CM | POA: Diagnosis not present

## 2018-07-31 DIAGNOSIS — Z79899 Other long term (current) drug therapy: Secondary | ICD-10-CM | POA: Diagnosis not present

## 2018-07-31 DIAGNOSIS — I700 Atherosclerosis of aorta: Secondary | ICD-10-CM | POA: Diagnosis not present

## 2018-07-31 DIAGNOSIS — Z5112 Encounter for antineoplastic immunotherapy: Secondary | ICD-10-CM | POA: Diagnosis not present

## 2018-07-31 DIAGNOSIS — I10 Essential (primary) hypertension: Secondary | ICD-10-CM | POA: Insufficient documentation

## 2018-07-31 LAB — CBC WITH DIFFERENTIAL (CANCER CENTER ONLY)
Abs Immature Granulocytes: 0.01 10*3/uL (ref 0.00–0.07)
Basophils Absolute: 0 10*3/uL (ref 0.0–0.1)
Basophils Relative: 0 %
Eosinophils Absolute: 0.2 10*3/uL (ref 0.0–0.5)
Eosinophils Relative: 3 %
HCT: 35.3 % — ABNORMAL LOW (ref 36.0–46.0)
Hemoglobin: 11.7 g/dL — ABNORMAL LOW (ref 12.0–15.0)
Immature Granulocytes: 0 %
LYMPHS PCT: 24 %
Lymphs Abs: 1.3 10*3/uL (ref 0.7–4.0)
MCH: 31.1 pg (ref 26.0–34.0)
MCHC: 33.1 g/dL (ref 30.0–36.0)
MCV: 93.9 fL (ref 80.0–100.0)
Monocytes Absolute: 0.6 10*3/uL (ref 0.1–1.0)
Monocytes Relative: 12 %
Neutro Abs: 3.2 10*3/uL (ref 1.7–7.7)
Neutrophils Relative %: 61 %
Platelet Count: 136 10*3/uL — ABNORMAL LOW (ref 150–400)
RBC: 3.76 MIL/uL — ABNORMAL LOW (ref 3.87–5.11)
RDW: 13.7 % (ref 11.5–15.5)
WBC Count: 5.3 10*3/uL (ref 4.0–10.5)
nRBC: 0 % (ref 0.0–0.2)

## 2018-07-31 LAB — CMP (CANCER CENTER ONLY)
ALT: 15 U/L (ref 0–44)
AST: 27 U/L (ref 15–41)
Albumin: 3.1 g/dL — ABNORMAL LOW (ref 3.5–5.0)
Alkaline Phosphatase: 145 U/L — ABNORMAL HIGH (ref 38–126)
Anion gap: 8 (ref 5–15)
BUN: 10 mg/dL (ref 8–23)
CHLORIDE: 107 mmol/L (ref 98–111)
CO2: 24 mmol/L (ref 22–32)
Calcium: 8.8 mg/dL — ABNORMAL LOW (ref 8.9–10.3)
Creatinine: 0.61 mg/dL (ref 0.44–1.00)
GFR, Est AFR Am: >60 mL/min (ref >60)
Glucose, Bld: 97 mg/dL (ref 70–99)
Potassium: 3.5 mmol/L (ref 3.5–5.1)
Sodium: 139 mmol/L (ref 135–145)
Total Bilirubin: 0.8 mg/dL (ref 0.3–1.2)
Total Protein: 6.8 g/dL (ref 6.5–8.1)

## 2018-07-31 LAB — TOTAL PROTEIN, URINE DIPSTICK: Protein, ur: <30 mg/dL — AB

## 2018-07-31 LAB — TSH: TSH: <0.08 u[IU]/mL — ABNORMAL LOW (ref 0.308–3.960)

## 2018-07-31 MED ORDER — SODIUM CHLORIDE 0.9 % IV SOLN
14.3000 mg/kg | INTRAVENOUS | Status: DC
  Administered 2018-07-31: 1400 mg via INTRAVENOUS
  Filled 2018-07-31: qty 48

## 2018-07-31 MED ORDER — SODIUM CHLORIDE 0.9 % IV SOLN
1200.0000 mg | Freq: Once | INTRAVENOUS | Status: AC
  Administered 2018-07-31: 1200 mg via INTRAVENOUS
  Filled 2018-07-31: qty 20

## 2018-07-31 MED ORDER — SODIUM CHLORIDE 0.9 % IV SOLN
Freq: Once | INTRAVENOUS | Status: AC
  Administered 2018-07-31: 09:00:00 via INTRAVENOUS
  Filled 2018-07-31: qty 250

## 2018-07-31 NOTE — Progress Notes (Signed)
[facility] OFFICE PROGRESS NOTE  System, Pcp Not In No address on file  DIAGNOSIS: Stage IV (T1b, N3, M1b) non-small cell lung cancer, adenocarcinoma with positive EGFR mutation in exon 21 (L858R) and recently found to have T790M resistant mutation, presented with left upper lobe lung mass in addition to left hilar and bilateral mediastinal lymphadenopathy as well as liver metastasis diagnosed in May of 2015.  PRIOR THERAPY: 1) Tarceva 150 mg by mouth daily. Started 11/25/2013. Status post 5 months of treatment discontinued today secondary to intolerance with persistent paronychia. 2) Tarceva 100 mg by mouth daily. Started 06/03/2014. Status post 8 months of treatment.  3) stereotactic radiotherapy to liver lesions under the care of Dr. Tammi Klippel. Last dose 03/02/2017 4) Tagrisso 80 mg by mouth daily status post 34 months of treatment. First dose was given 03/04/2015.  This was discontinued secondary to disease progression. 5) Systemic chemotherapy with carboplatin for AUC of 6, paclitaxel 200 mg/M2, Avastin 15 mg/KG and Tecentriq 1200 mg IV every 3 weeks with Neulasta support.  First dose 02/21/2018.  Status post 4 cycles.  Starting from cycle #4 the dose of carboplatin will be for AUC of 5 and paclitaxel 150 mg/M2.  CURRENT THERAPY: Maintenance treatment with Avastin 15 mg/KG and Tecentriq 1200 mg IV every 3 weeks.  First cycle June 01, 2018.  Status post 3 cycles.  INTERVAL HISTORY: Debra Hodges 68 y.o. female returns clinic today accompanied by her husband.  She has completed 3 cycles of maintenance therapy and tolerated it well without any concerning complaints.  She is feeling well today but still continues to endorse peripheral neuropathy for which she is currently taking 100 mg T.I.D. of Neurontin.  She denies any fevers, chills, night sweats, or weight loss.  She denies any chest pain, shortness of breath, cough, or hemoptysis.  She denies any nausea, vomiting, diarrhea,  or constipation.  She denies any headache but reports some worsening blurry vision. She is overdue for an eye exam. Her last eye exam was approximately 1 year ago.  She denies any rashes or skin changes. 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 She will be refilling her prescription today. She denies any bleeding or bruising such as melena, hematochezia, epistaxis, or hematuria.  She is here today for evaluation prior to starting cycle #4 today as scheduled.   MEDICAL HISTORY: Past Medical History:  Diagnosis Date  . High blood pressure   . Liver lesion 06/26/2016  . Non-small cell carcinoma of lung, stage 4 ([place]) dx'd 10/2013    ALLERGIES:  is allergic to peanut-containing drug products and penicillins.  MEDICATIONS:  Current Outpatient Medications  Medication Sig Dispense Refill  . acetaminophen (TYLENOL) 325 MG tablet Take 650 mg by mouth every 6 (six) hours as needed for mild pain. Reported on 06/15/2015    . apixaban (ELIQUIS) 5 MG TABS tablet Take 1 tablet (5 mg total) by mouth 2 (two) times daily. 60 tablet 2  . calcium carbonate (OS-CAL) 600 MG TABS tablet Take 600 mg by mouth daily.     Marland Kitchen CLIMARA 0.1 MG/24HR patch Place 1 patch onto the skin once a week. On Thursday    . gabapentin (NEURONTIN) 100 MG capsule TAKE 3 CAPSULES(300 MG) BY MOUTH THREE TIMES DAILY 90 capsule 1  . levothyroxine (SYNTHROID, LEVOTHROID) 50 MCG tablet Take 50 mcg by mouth at bedtime.     Marland Kitchen loperamide (IMODIUM) 2 MG capsule Take 2 mg by mouth daily as needed  for diarrhea or loose stools. Reported on 11/22/2015    . loratadine (CLARITIN) 10 MG tablet Take 10 mg by mouth daily.    . Multiple Vitamins-Calcium (ONE-A-DAY WOMENS PO) Take 1 tablet by mouth daily.    Marland Kitchen oxyCODONE-acetaminophen (PERCOCET/ROXICET) 5-325 MG tablet Take 1 tablet by mouth every 6 (six) hours as needed for severe pain. 60 tablet 0  . potassium chloride SA (K-DUR,KLOR-CON) 20 MEQ tablet Take 20 mEq by mouth at bedtime.     .  prochlorperazine (COMPAZINE) 10 MG tablet TAKE 1 TABLET(10 MG) BY MOUTH EVERY 6 HOURS AS NEEDED FOR NAUSEA OR VOMITING (Patient taking differently: Take 10 mg by mouth every 6 (six) hours as needed for nausea or vomiting. ) 385 tablet 3  . Sodium Fluoride (CLINPRO 5000) 1.1 % PSTE Place 1 application onto teeth 2 (two) times daily.     Marland Kitchen spironolactone (ALDACTONE) 25 MG tablet Take 25 mg by mouth daily.     . vitamin C (ASCORBIC ACID) 500 MG tablet Take 500 mg by mouth daily.     No current facility-administered medications for this visit.    Facility-Administered Medications Ordered in Other Visits  Medication Dose Route Frequency Provider Last Rate Last Dose  . atezolizumab (TECENTRIQ) 1,200 mg in sodium chloride 0.9 % 250 mL chemo infusion  1,200 mg Intravenous Once Curt Bears, MD      . bevacizumab (AVASTIN) 1,475 mg in sodium chloride 0.9 % 100 mL chemo infusion  15 mg/kg (Treatment Plan Recorded) Intravenous Q21 days Curt Bears, MD        SURGICAL HISTORY:  Past Surgical History:  Procedure Laterality Date  . ABDOMINAL HYSTERECTOMY    . BREAST BIOPSY     Right breast  . VIDEO BRONCHOSCOPY Bilateral 10/27/2013   Procedure: VIDEO BRONCHOSCOPY WITH FLUORO;  Surgeon: Collene Gobble, MD;  Location: [facility];  Service: Cardiopulmonary;  Laterality: Bilateral;    REVIEW OF SYSTEMS:   Review of Systems  Constitutional: Negative for appetite change, chills, fatigue, fever and unexpected weight change.  HENT:   Negative for mouth sores, nosebleeds, sore throat and trouble swallowing.   Eyes: Positive for worsening vision. Negative for icterus.  Respiratory: Negative for cough, hemoptysis, shortness of breath and wheezing.   Cardiovascular: Negative for chest pain and leg swelling.  Gastrointestinal: Negative for abdominal pain, constipation, diarrhea, nausea and vomiting.  Genitourinary: Negative for bladder incontinence, difficulty urinating, dysuria, frequency and  hematuria.   Musculoskeletal: Negative for back pain, gait problem, neck pain and neck stiffness.  Skin: Negative for itching and rash.  Neurological: Negative for dizziness, extremity weakness, gait problem, headaches, light-headedness and seizures.  Hematological: Negative for adenopathy. Does not bruise/bleed easily.  Psychiatric/Behavioral: Negative for confusion, depression and sleep disturbance. The patient is not nervous/anxious.     PHYSICAL EXAMINATION:  Blood pressure (!) 141/60, pulse 71, temperature 98.6 F (37 C), temperature source Oral, resp. rate 18, height '5\' 6"'  (1.676 m), weight 192 lb 6.4 oz (87.3 kg), SpO2 99 %.  ECOG PERFORMANCE STATUS: 1 - Symptomatic but completely ambulatory  Physical Exam   Constitutional: Oriented to person, place, and time and well-developed, well-nourished, and in no distress. No distress.  HENT:  Head: Normocephalic and atraumatic.  Mouth/Throat: Oropharynx is clear and moist. No oropharyngeal exudate.  Eyes: Conjunctivae are normal. Right eye exhibits no discharge. Left eye exhibits no discharge. No scleral icterus.  Neck: Normal range of motion. Neck supple.  Cardiovascular: Normal rate, regular rhythm, normal heart sounds and intact  distal pulses.   Pulmonary/Chest: Effort normal and breath sounds normal. No respiratory distress. No wheezes. No rales.  Abdominal: Soft. Bowel sounds are normal. Exhibits no distension and no mass. There is no tenderness.  Musculoskeletal: Normal range of motion. Exhibits no edema.  Lymphadenopathy:    No cervical adenopathy.  Neurological: Alert and oriented to person, place, and time. Exhibits normal muscle tone. Gait normal. Coordination normal.  Skin: Skin is warm and dry. No rash noted. Not diaphoretic. No erythema. No pallor.  Psychiatric: Mood, memory and judgment normal.  Vitals reviewed.  LABORATORY DATA: Lab Results  Component Value Date   WBC 5.3 07/31/2018   HGB 11.7 (L) 07/31/2018   HCT  35.3 (L) 07/31/2018   MCV 93.9 07/31/2018   PLT 136 (L) 07/31/2018      Chemistry      Component Value Date/Time   NA 139 07/31/2018 0809   NA 141 04/30/2017 1541   K 3.5 07/31/2018 0809   K 4.1 04/30/2017 1541   CL 107 07/31/2018 0809   CO2 24 07/31/2018 0809   CO2 26 04/30/2017 1541   BUN 10 07/31/2018 0809   BUN 14.1 04/30/2017 1541   CREATININE 0.61 07/31/2018 0809   CREATININE 0.8 04/30/2017 1541      Component Value Date/Time   CALCIUM 8.8 (L) 07/31/2018 0809   CALCIUM 9.6 04/30/2017 1541   ALKPHOS 145 (H) 07/31/2018 0809   ALKPHOS 105 04/30/2017 1541   AST 27 07/31/2018 0809   AST 40 (H) 04/30/2017 1541   ALT 15 07/31/2018 0809   ALT 27 04/30/2017 1541   BILITOT 0.8 07/31/2018 0809   BILITOT 0.55 04/30/2017 1541       RADIOGRAPHIC STUDIES:  Ct Chest W Contrast  Result Date: 07/08/2018 CLINICAL DATA:  Non-small-cell lung cancer with liver metastasis. Ongoing oral chemotherapy. Radiation therapy to lung and liver. Short of breath on exertion. Nausea and vomiting. EXAM: CT CHEST, ABDOMEN, AND PELVIS WITH CONTRAST TECHNIQUE: Multidetector CT imaging of the chest, abdomen and pelvis was performed following the standard protocol during bolus administration of intravenous contrast. CONTRAST:  133m OMNIPAQUE IOHEXOL 300 MG/ML  SOLN COMPARISON:  04/29/2018 FINDINGS: CT CHEST FINDINGS Cardiovascular: Aortic atherosclerosis. Normal heart size, without pericardial effusion. No central pulmonary embolism, on this non-dedicated study. Mediastinum/Nodes: Bilateral small thyroid nodules are nonspecific. No supraclavicular adenopathy. No mediastinal or hilar adenopathy. Lungs/Pleura: No pleural fluid. Medial left upper and superior segment left lower lobe areas of consolidation and air bronchograms are relatively similar, without well-defined dominant locally recurrent disease. Musculoskeletal: No acute osseous abnormality. A left humeral head sclerotic lesion is most likely an  enchondroma and is similar to on the prior at 1.1 cm. CT ABDOMEN PELVIS FINDINGS Hepatobiliary: Bilateral hepatic metastasis. Index high right hepatic lobe lesion measures 1.5 x 1.4 cm on image 49/2 versus 2.1 x 2.4 cm previously. Segment 2 lesion measures 1.7 x 1.6 cm on image 47/2 versus 2.7 x 2.7 cm on the prior exam. No enlarging lesions. Normal gallbladder, without biliary ductal dilatation. Pancreas: Normal, without mass or ductal dilatation. Spleen: A subcapsular hypoattenuating 9 mm splenic lesion is similar and of doubtful clinical significance. Adrenals/Urinary Tract: Normal adrenal glands. Left renal collecting system calculi of up to 5 mm. 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 Bilateral too small to characterize renal lesions. Normal urinary bladder. Stomach/Bowel: Normal stomach, without wall thickening. Normal colon and terminal ileum. Normal small bowel. Vascular/Lymphatic: Aortic and branch vessel atherosclerosis. No abdominopelvic adenopathy. Reproductive: Hysterectomy.  No  adnexal mass. Other: No significant free fluid. Mild pelvic floor laxity. No evidence of omental or peritoneal disease. Musculoskeletal: No acute osseous abnormality. IMPRESSION: 1. Improvement in hepatic metastasis. No new or progressive disease. 2. Similar areas of consolidation in the left upper and superior segment left lower lobe, likely radiation induced. 3.  Aortic Atherosclerosis (ICD10-I70.0). 4. Left nephrolithiasis. Electronically Signed   By: Abigail Miyamoto M.D.   On: 07/08/2018 20:53   Ct Abdomen Pelvis W Contrast  Result Date: 07/08/2018 CLINICAL DATA:  Non-small-cell lung cancer with liver metastasis. Ongoing oral chemotherapy. Radiation therapy to lung and liver. Short of breath on exertion. Nausea and vomiting. EXAM: CT CHEST, ABDOMEN, AND PELVIS WITH CONTRAST TECHNIQUE: Multidetector CT imaging of the chest, abdomen and pelvis was performed following the standard protocol during bolus administration of  intravenous contrast. CONTRAST:  142m OMNIPAQUE IOHEXOL 300 MG/ML  SOLN COMPARISON:  04/29/2018 FINDINGS: CT CHEST FINDINGS Cardiovascular: Aortic atherosclerosis. Normal heart size, without pericardial effusion. No central pulmonary embolism, on this non-dedicated study. Mediastinum/Nodes: Bilateral small thyroid nodules are nonspecific. No supraclavicular adenopathy. No mediastinal or hilar adenopathy. Lungs/Pleura: No pleural fluid. Medial left upper and superior segment left lower lobe areas of consolidation and air bronchograms are relatively similar, without well-defined dominant locally recurrent disease. Musculoskeletal: No acute osseous abnormality. A left humeral head sclerotic lesion is most likely an enchondroma and is similar to on the prior at 1.1 cm. CT ABDOMEN PELVIS FINDINGS Hepatobiliary: Bilateral hepatic metastasis. Index high right hepatic lobe lesion measures 1.5 x 1.4 cm on image 49/2 versus 2.1 x 2.4 cm previously. Segment 2 lesion measures 1.7 x 1.6 cm on image 47/2 versus 2.7 x 2.7 cm on the prior exam. No enlarging lesions. Normal gallbladder, without biliary ductal dilatation. Pancreas: Normal, without mass or ductal dilatation. Spleen: A subcapsular hypoattenuating 9 mm splenic lesion is similar and of doubtful clinical significance. Adrenals/Urinary Tract: Normal adrenal glands. Left renal collecting system calculi of up to 5 mm. 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 Bilateral too small to characterize renal lesions. Normal urinary bladder. Stomach/Bowel: Normal stomach, without wall thickening. Normal colon and terminal ileum. Normal small bowel. Vascular/Lymphatic: Aortic and branch vessel atherosclerosis. No abdominopelvic adenopathy. Reproductive: Hysterectomy.  No adnexal mass. Other: No significant free fluid. Mild pelvic floor laxity. No evidence of omental or peritoneal disease. Musculoskeletal: No acute osseous abnormality. IMPRESSION: 1. Improvement in hepatic  metastasis. No new or progressive disease. 2. Similar areas of consolidation in the left upper and superior segment left lower lobe, likely radiation induced. 3.  Aortic Atherosclerosis (ICD10-I70.0). 4. Left nephrolithiasis. Electronically Signed   By: KAbigail MiyamotoM.D.   On: 07/08/2018 20:53     ASSESSMENT/PLAN:  This is a very pleasant 68year old white female with a stage IV non-small cell lung cancer, adenocarcinoma with positive EGFR mutation in exon 21 (L858R) diagnosed in May 2015 status post treatment with Tarceva for 13 months discontinued secondary to disease progression and development of T790M resistant mutation. The patient then underwent treatment with Tagrisso 80 mg by mouth daily status post 34 months. The patient was referred to Dr. MTammi Klippelfrom radiation oncology for consideration of palliative radiotherapy to the enlarging left upper lobe suprahilar mass to avoid further obstruction of her bronchial tree. Repeat CT scan of the chest, abdomen and pelvis showed findings for disease progression especially in the liver. The patient discontinued her treatment with Tagrisso and she was started on systemic chemotherapy with carboplatin for AUC of 6, paclitaxel  200 mg/M2, Avastin 15 mg/KG as well as Tecentriq (Atezolizumab) 1200 mg IV every 3 weeks status post 4 cycles. She had reduced dose carboplatin and paclitaxel during cycle #4. Starting from cycle #5 , the patient has been on maintenance treatment with a Avastin 15 mg/KG as well as Tecentriq 1200 mg IV every 3 weeks.  First dose was on January 4th, 2020.  She is status post 3 cycles of the maintenance phase of her treatment. She was seen with Dr. Julien Nordmann today. The patient continues to tolerate this treatment well without any concerning adverse effects. Recommend she proceed with cycle #4 today as scheduled.  For peripheral neuropathy, continue on Neurontin 100 mg 3 times daily. For her history of PE, she is currently taking Elliquis.  The patient had been having financial difficulties obtaining Elliquis which has been resolved. Her last dose was yesterday. She will be refilling her prescription later today. She is tolerating treatment well without any adverse effects such as bleeding/bruising. The patient's blood pressure was slightly elevated at 141/60 today. The patient states she has been out of her spironolactone for hypertension for several weeks. This is managed by her PCP and the patient is overdue for an annual appointment. She was advised to follow up with her PCP for evaluation and a refill of her medication.  The patient inquired about whether it was permitted to get her eyes checked by her optometrist. She was advised that she may proceed with an annual eye exam. She just needs to get permission from our office prior to any procedures.  Mild proteinuria noted on urine dipstick today. No intervention/changes needed at this time. Will continue to monitor at subsequent visit. The patient was advised to call immediately if she has any concerning symptoms in the interval. The patient voices understanding of current disease status and treatment options and is in agreement with the current care plan. All questions were answered. The patient knows to call the clinic with any problems, questions or concerns. We can certainly see the patient much sooner if necessary  No orders of the defined types were placed in this encounter.    Cassandra L Heilingoetter, PA-C 07/31/18  ADDENDUM: Hematology/Oncology Attending: I had a face-to-face encounter with the patient today.  I recommended her care plan.  This is a very pleasant 68 years old white female with metastatic non-small cell lung cancer, adenocarcinoma diagnosed in May 2015 status post several regimen of treatment with targeted therapy as well as chemotherapy. She is currently on maintenance treatment with Avastin and Tecentriq status post 3 cycles of this regimen.  She has  been tolerating this treatment well with no concerning adverse effects.  She has less fatigue and weakness.  She denied having any nausea or vomiting. I recommended for the patient to proceed with cycle #4 today as scheduled. She will come back for follow-up visit in 3 weeks for evaluation before the next cycle of her treatment. The patient was advised to call immediately if she has any concerning symptoms in the interval.  Disclaimer: This note was dictated with voice recognition software. Similar sounding words can inadvertently be transcribed and may be missed upon review. Eilleen Kempf, MD 07/31/18

## 2018-07-31 NOTE — Telephone Encounter (Signed)
Scheduled appt per 3/4 los - pt to get an updated schedule next visit

## 2018-07-31 NOTE — Patient Instructions (Signed)
[facility] Discharge Instructions for Patients Receiving Chemotherapy  Today you received the following chemotherapy agents :  Tecentriq, Avastin.  To help prevent nausea and vomiting after your treatment, we encourage you to take your nausea medication as prescribed.   If you develop nausea and vomiting that is not controlled by your nausea medication, call the clinic.   BELOW ARE SYMPTOMS THAT SHOULD BE REPORTED IMMEDIATELY:  *FEVER GREATER THAN 100.5 F  *CHILLS WITH OR WITHOUT FEVER  NAUSEA AND VOMITING THAT IS NOT CONTROLLED WITH YOUR NAUSEA MEDICATION  *UNUSUAL SHORTNESS OF BREATH  *UNUSUAL BRUISING OR BLEEDING  TENDERNESS IN MOUTH AND THROAT WITH OR WITHOUT PRESENCE OF ULCERS  *URINARY PROBLEMS  *BOWEL PROBLEMS  UNUSUAL RASH Items with * indicate a potential emergency and should be followed up as soon as possible.  Feel free to call the clinic should you have any questions or concerns. The clinic phone number is [phone].  Please show the [facility] at check-in to the Emergency Department and triage nurse.

## 2018-08-21 ENCOUNTER — Encounter: Payer: Self-pay | Admitting: Internal Medicine

## 2018-08-21 ENCOUNTER — Telehealth: Payer: Self-pay | Admitting: Internal Medicine

## 2018-08-21 ENCOUNTER — Inpatient Hospital Stay: Payer: Medicare Other

## 2018-08-21 ENCOUNTER — Other Ambulatory Visit: Payer: Self-pay

## 2018-08-21 ENCOUNTER — Inpatient Hospital Stay (HOSPITAL_BASED_OUTPATIENT_CLINIC_OR_DEPARTMENT_OTHER): Payer: Medicare Other | Admitting: Internal Medicine

## 2018-08-21 VITALS — BP 152/66 | HR 76 | Temp 98.0°F | Resp 18 | Ht 66.0 in

## 2018-08-21 DIAGNOSIS — Z86711 Personal history of pulmonary embolism: Secondary | ICD-10-CM | POA: Diagnosis not present

## 2018-08-21 DIAGNOSIS — I10 Essential (primary) hypertension: Secondary | ICD-10-CM

## 2018-08-21 DIAGNOSIS — G629 Polyneuropathy, unspecified: Secondary | ICD-10-CM

## 2018-08-21 DIAGNOSIS — C3492 Malignant neoplasm of unspecified part of left bronchus or lung: Secondary | ICD-10-CM

## 2018-08-21 DIAGNOSIS — Z923 Personal history of irradiation: Secondary | ICD-10-CM

## 2018-08-21 DIAGNOSIS — Z79899 Other long term (current) drug therapy: Secondary | ICD-10-CM

## 2018-08-21 DIAGNOSIS — Z5112 Encounter for antineoplastic immunotherapy: Secondary | ICD-10-CM

## 2018-08-21 DIAGNOSIS — C3412 Malignant neoplasm of upper lobe, left bronchus or lung: Secondary | ICD-10-CM | POA: Diagnosis not present

## 2018-08-21 DIAGNOSIS — R5382 Chronic fatigue, unspecified: Secondary | ICD-10-CM

## 2018-08-21 DIAGNOSIS — E042 Nontoxic multinodular goiter: Secondary | ICD-10-CM | POA: Diagnosis not present

## 2018-08-21 DIAGNOSIS — C778 Secondary and unspecified malignant neoplasm of lymph nodes of multiple regions: Secondary | ICD-10-CM

## 2018-08-21 DIAGNOSIS — C3490 Malignant neoplasm of unspecified part of unspecified bronchus or lung: Secondary | ICD-10-CM

## 2018-08-21 DIAGNOSIS — C787 Secondary malignant neoplasm of liver and intrahepatic bile duct: Secondary | ICD-10-CM | POA: Diagnosis not present

## 2018-08-21 DIAGNOSIS — Z7901 Long term (current) use of anticoagulants: Secondary | ICD-10-CM

## 2018-08-21 DIAGNOSIS — Z5111 Encounter for antineoplastic chemotherapy: Secondary | ICD-10-CM

## 2018-08-21 DIAGNOSIS — Z9221 Personal history of antineoplastic chemotherapy: Secondary | ICD-10-CM | POA: Diagnosis not present

## 2018-08-21 DIAGNOSIS — I2699 Other pulmonary embolism without acute cor pulmonale: Secondary | ICD-10-CM

## 2018-08-21 LAB — CBC WITH DIFFERENTIAL (CANCER CENTER ONLY)
Abs Immature Granulocytes: 0.01 10*3/uL (ref 0.00–0.07)
Basophils Absolute: 0 10*3/uL (ref 0.0–0.1)
Basophils Relative: 0 %
Eosinophils Absolute: 0.2 10*3/uL (ref 0.0–0.5)
Eosinophils Relative: 3 %
HEMATOCRIT: 37.2 % (ref 36.0–46.0)
Hemoglobin: 12.4 g/dL (ref 12.0–15.0)
Immature Granulocytes: 0 %
Lymphocytes Relative: 27 %
Lymphs Abs: 1.7 10*3/uL (ref 0.7–4.0)
MCH: 30.9 pg (ref 26.0–34.0)
MCHC: 33.3 g/dL (ref 30.0–36.0)
MCV: 92.8 fL (ref 80.0–100.0)
Monocytes Absolute: 0.8 10*3/uL (ref 0.1–1.0)
Monocytes Relative: 13 %
NEUTROS ABS: 3.6 10*3/uL (ref 1.7–7.7)
Neutrophils Relative %: 57 %
Platelet Count: 138 10*3/uL — ABNORMAL LOW (ref 150–400)
RBC: 4.01 MIL/uL (ref 3.87–5.11)
RDW: 14.5 % (ref 11.5–15.5)
WBC Count: 6.3 10*3/uL (ref 4.0–10.5)
nRBC: 0 % (ref 0.0–0.2)

## 2018-08-21 LAB — CMP (CANCER CENTER ONLY)
ALT: 19 U/L (ref 0–44)
AST: 28 U/L (ref 15–41)
Albumin: 3.3 g/dL — ABNORMAL LOW (ref 3.5–5.0)
Alkaline Phosphatase: 130 U/L — ABNORMAL HIGH (ref 38–126)
Anion gap: 10 (ref 5–15)
BUN: 12 mg/dL (ref 8–23)
CO2: 24 mmol/L (ref 22–32)
Calcium: 8.8 mg/dL — ABNORMAL LOW (ref 8.9–10.3)
Chloride: 107 mmol/L (ref 98–111)
Creatinine: 0.71 mg/dL (ref 0.44–1.00)
GFR, Est AFR Am: >60 mL/min
GFR, Estimated: >60 mL/min
Glucose, Bld: 85 mg/dL (ref 70–99)
Potassium: 3.4 mmol/L — ABNORMAL LOW (ref 3.5–5.1)
Sodium: 141 mmol/L (ref 135–145)
Total Bilirubin: 0.8 mg/dL (ref 0.3–1.2)
Total Protein: 7.1 g/dL (ref 6.5–8.1)

## 2018-08-21 LAB — TOTAL PROTEIN, URINE DIPSTICK: Protein, ur: NEGATIVE mg/dL

## 2018-08-21 LAB — TSH: TSH: <0.08 u[IU]/mL — ABNORMAL LOW (ref 0.308–3.960)

## 2018-08-21 MED ORDER — SODIUM CHLORIDE 0.9 % IV SOLN
1200.0000 mg | Freq: Once | INTRAVENOUS | Status: AC
  Administered 2018-08-21: 1200 mg via INTRAVENOUS
  Filled 2018-08-21: qty 20

## 2018-08-21 MED ORDER — SODIUM CHLORIDE 0.9% FLUSH
10.0000 mL | INTRAVENOUS | Status: DC | PRN
  Filled 2018-08-21: qty 10

## 2018-08-21 MED ORDER — SODIUM CHLORIDE 0.9 % IV SOLN
14.3000 mg/kg | Freq: Once | INTRAVENOUS | Status: AC
  Administered 2018-08-21: 1400 mg via INTRAVENOUS
  Filled 2018-08-21: qty 56

## 2018-08-21 MED ORDER — SODIUM CHLORIDE 0.9 % IV SOLN
Freq: Once | INTRAVENOUS | Status: AC
  Administered 2018-08-21: 13:00:00 via INTRAVENOUS
  Filled 2018-08-21: qty 250

## 2018-08-21 MED ORDER — HEPARIN SOD (PORK) LOCK FLUSH 100 UNIT/ML IV SOLN
500.0000 [IU] | Freq: Once | INTRAVENOUS | Status: DC | PRN
  Filled 2018-08-21: qty 5

## 2018-08-21 NOTE — Patient Instructions (Signed)
[facility] [place] Discharge Instructions for Patients Receiving Chemotherapy  Today you received the following chemotherapy agents :  Tecentriq, Avastin.  To help prevent nausea and vomiting after your treatment, we encourage you to take your nausea medication as prescribed.   If you develop nausea and vomiting that is not controlled by your nausea medication, call the clinic.   BELOW ARE SYMPTOMS THAT SHOULD BE REPORTED IMMEDIATELY:  *FEVER GREATER THAN 100.5 F  *CHILLS WITH OR WITHOUT FEVER  NAUSEA AND VOMITING THAT IS NOT CONTROLLED WITH YOUR NAUSEA MEDICATION  *UNUSUAL SHORTNESS OF BREATH  *UNUSUAL BRUISING OR BLEEDING  TENDERNESS IN MOUTH AND THROAT WITH OR WITHOUT PRESENCE OF ULCERS  *URINARY PROBLEMS  *BOWEL PROBLEMS  UNUSUAL RASH Items with * indicate a potential emergency and should be followed up as soon as possible.  Feel free to call the clinic should you have any questions or concerns. The clinic phone number is [phone].  Please show the [facility] at check-in to the Emergency Department and triage nurse.

## 2018-08-21 NOTE — Telephone Encounter (Signed)
Gave avs and calendar ° °

## 2018-08-21 NOTE — Progress Notes (Signed)
[place] [phone]   [phone]  OFFICE PROGRESS NOTE  System, Pcp Not In No address on file  DIAGNOSIS: Stage IV (T1b, N3, M1b) non-small cell lung cancer, adenocarcinoma with positive EGFR mutation in exon 21 (L858R) and recently found to have T790M resistant mutation, presented with left upper lobe lung mass in addition to left hilar and bilateral mediastinal lymphadenopathy as well as liver metastasis diagnosed in May of 2015.  PRIOR THERAPY: 1) Tarceva 150 mg by mouth daily. Started 11/25/2013. Status post 5 months of treatment discontinued today secondary to intolerance with persistent paronychia. 2) Tarceva 100 mg by mouth daily. Started 06/03/2014. Status post 8 months of treatment.  3) stereotactic radiotherapy to liver lesions under the care of Dr. Tammi Klippel. Last dose 03/02/2017 4) Tagrisso 80 mg by mouth daily status post 34 months of treatment. First dose was given 03/04/2015.  This was discontinued secondary to disease progression. 5) Systemic chemotherapy with carboplatin for AUC of 6, paclitaxel 200 mg/M2, Avastin 15 mg/KG and Tecentriq 1200 mg IV every 3 weeks with Neulasta support.  First dose 02/21/2018.  Status post 4 cycles.  Starting from cycle #4 the dose of carboplatin will be for AUC of 5 and paclitaxel 150 mg/M2.   CURRENT THERAPY: Maintenance treatment with Avastin 15 mg/KG and Tecentriq 1200 mg IV every 3 weeks.  First cycle May 30, 2018.  Status post 2 cycles.  INTERVAL HISTORY: Debra Hodges 68 y.o. female returns to the clinic today for follow-up visit.  The patient is feeling fine today with no concerning complaints.  She has been tolerating the maintenance therapy very well.  She denied having any chest pain, shortness of breath, cough or hemoptysis.  She denied having any nausea, vomiting, diarrhea or constipation.  She has no headache or visual changes.  She is here today for evaluation before starting cycle #3.  MEDICAL HISTORY: Past Medical History:  Diagnosis Date  . High blood pressure   . Liver lesion 06/26/2016  . Non-small cell carcinoma of lung, stage 4 ([place]) dx'd 10/2013    ALLERGIES:  is allergic to peanut-containing drug products and penicillins.  MEDICATIONS:  Current Outpatient Medications  Medication Sig Dispense Refill  . acetaminophen (TYLENOL) 325 MG tablet Take 650 mg by mouth every 6 (six) hours as needed for mild pain. Reported on 06/15/2015    . apixaban (ELIQUIS) 5 MG TABS tablet Take 1 tablet (5 mg total) by mouth 2 (two) times daily. 60 tablet 2  . calcium carbonate (OS-CAL) 600 MG TABS tablet Take 600 mg by mouth daily.     Marland Kitchen CLIMARA 0.1 MG/24HR patch Place 1 patch onto the skin once a week. On Thursday    . gabapentin (NEURONTIN) 100 MG capsule TAKE 3 CAPSULES(300 MG) BY MOUTH THREE TIMES DAILY 90 capsule 1  . levothyroxine (SYNTHROID, LEVOTHROID) 50 MCG tablet Take 50 mcg by mouth at bedtime.     Marland Kitchen loperamide (IMODIUM) 2 MG capsule Take 2 mg by mouth daily as needed for diarrhea or loose stools. Reported on 11/22/2015    . loratadine (CLARITIN) 10 MG tablet Take 10 mg by mouth daily.    . Multiple Vitamins-Calcium (ONE-A-DAY WOMENS PO) Take 1 tablet by mouth daily.    Marland Kitchen oxyCODONE-acetaminophen (PERCOCET/ROXICET) 5-325 MG tablet Take 1 tablet by mouth every 6 (six) hours as needed for severe pain. 60 tablet 0  . potassium chloride SA (K-DUR,KLOR-CON) 20 MEQ tablet Take 20 mEq by mouth at bedtime.     Marland Kitchen  prochlorperazine (COMPAZINE) 10 MG tablet TAKE 1 TABLET(10 MG) BY MOUTH EVERY 6 HOURS AS NEEDED FOR NAUSEA OR VOMITING (Patient taking differently: Take 10 mg by mouth every 6 (six) hours as needed for nausea or vomiting. ) 385 tablet 3  . Sodium Fluoride (CLINPRO 5000) 1.1 % PSTE Place 1 application onto teeth 2 (two) times daily.     Marland Kitchen spironolactone (ALDACTONE) 25 MG tablet Take 25 mg by mouth daily.     . vitamin C (ASCORBIC ACID) 500 MG tablet Take 500 mg by mouth daily.      No current facility-administered medications for this visit.     SURGICAL HISTORY:  Past Surgical History:  Procedure Laterality Date  . ABDOMINAL HYSTERECTOMY    . BREAST BIOPSY     Right breast  . VIDEO BRONCHOSCOPY Bilateral 10/27/2013   Procedure: VIDEO BRONCHOSCOPY WITH FLUORO;  Surgeon: Collene Gobble, MD;  Location: [facility];  Service: Cardiopulmonary;  Laterality: Bilateral;    REVIEW OF SYSTEMS:  A comprehensive review of systems was negative except for: Constitutional: positive for fatigue Respiratory: positive for dyspnea on exertion   PHYSICAL EXAMINATION: General appearance: alert, cooperative, fatigued and no distress Head: Normocephalic, without obvious abnormality, atraumatic Neck: no adenopathy, no JVD, supple, symmetrical, trachea midline and thyroid not enlarged, symmetric, no tenderness/mass/nodules Lymph nodes: Cervical, supraclavicular, and axillary nodes normal. Resp: clear to auscultation bilaterally Back: symmetric, no curvature. ROM normal. No CVA tenderness. Cardio: regular rate and rhythm, S1, S2 normal, no murmur, click, rub or gallop GI: soft, non-tender; bowel sounds normal; no masses,  no organomegaly Extremities: extremities normal, atraumatic, no cyanosis or edema  ECOG PERFORMANCE STATUS: 1 - Symptomatic but completely ambulatory  Blood pressure (!) 152/66, pulse 76, temperature 98 F (36.7 C), temperature source Oral, resp. rate 18, height '5\' 6"'$  (1.676 m), SpO2 100 %.  LABORATORY DATA: Lab Results  Component Value Date   WBC 6.3 08/21/2018   HGB 12.4 08/21/2018   HCT 37.2 08/21/2018   MCV 92.8 08/21/2018   PLT 138 (L) 08/21/2018      Chemistry      Component Value Date/Time   NA 139 07/31/2018 0809   NA 141 04/30/2017 1541   K 3.5 07/31/2018 0809   K 4.1 04/30/2017 1541   CL 107 07/31/2018 0809   CO2 24 07/31/2018 0809   CO2 26 04/30/2017 1541   BUN 10 07/31/2018 0809   BUN 14.1 04/30/2017 1541   CREATININE 0.61 07/31/2018  0809   CREATININE 0.8 04/30/2017 1541      Component Value Date/Time   CALCIUM 8.8 (L) 07/31/2018 0809   CALCIUM 9.6 04/30/2017 1541   ALKPHOS 145 (H) 07/31/2018 0809   ALKPHOS 105 04/30/2017 1541   AST 27 07/31/2018 0809   AST 40 (H) 04/30/2017 1541   ALT 15 07/31/2018 0809   ALT 27 04/30/2017 1541   BILITOT 0.8 07/31/2018 0809   BILITOT 0.55 04/30/2017 1541       RADIOGRAPHIC STUDIES: No results found.  ASSESSMENT AND PLAN:  This is a very pleasant 68 years old white female with a stage IV non-small cell lung cancer, adenocarcinoma with positive EGFR mutation in exon 21 (L858R) diagnosed in May 2015 status post treatment with Tarceva for 13 months discontinued secondary to disease progression and development of T790M resistant mutation. The patient is currently undergoing treatment with Tagrisso 80 mg by mouth daily status post 34 months. I also referred the patient to Dr. Tammi Klippel from radiation oncology for consideration of  palliative radiotherapy to the enlarging left upper lobe suprahilar mass to avoid further obstruction of her bronchial tree. Repeat CT scan of the chest, abdomen and pelvis showed findings for disease progression especially in the liver. The patient discontinued her treatment with Tagrisso and she was started on systemic chemotherapy with carboplatin for AUC of 6, paclitaxel 200 mg/M2, Avastin 15 mg/KG as well as Tecentriq (Atezolizumab) 1200 mg IV every 3 weeks status post 4 cycles.   Starting from cycle #5 the patient will be on maintenance treatment with a Avastin 15 mg/KG as well as Tecentriq 1200 mg IV every 3 weeks.  First dose will be on May 30, 2018.  She is status post 4 cycles of the maintenance phase of her treatment. The patient has been tolerating this treatment well with no concerning adverse effect except for mild fatigue. I recommended for her to proceed with cycle #5 today. For the peripheral neuropathy, the patient on Neurontin 100 mg p.o. 3  times daily. For the history of pulmonary embolism, she is currently on Xarelto. I will see the patient back for follow-up visit in 3 weeks for evaluation after repeating CT scan of the chest, abdomen and pelvis for restaging of her disease. The patient was advised to call immediately if she has any concerning symptoms in the interval. The patient voices understanding of current disease status and treatment options and is in agreement with the current care plan. All questions were answered. The patient knows to call the clinic with any problems, questions or concerns. We can certainly see the patient much sooner if necessary.  Disclaimer: This note was dictated with voice recognition software. Similar sounding words can inadvertently be transcribed and may not be corrected upon review.

## 2018-09-09 ENCOUNTER — Ambulatory Visit (HOSPITAL_COMMUNITY): Admission: RE | Admit: 2018-09-09 | Payer: Medicare Other | Source: Ambulatory Visit

## 2018-09-09 ENCOUNTER — Ambulatory Visit (HOSPITAL_COMMUNITY)
Admission: RE | Admit: 2018-09-09 | Discharge: 2018-09-09 | Disposition: A | Discharge status: Home / Self Care | Payer: Medicare Other | Source: Ambulatory Visit | Attending: Internal Medicine | Admitting: Internal Medicine

## 2018-09-09 ENCOUNTER — Other Ambulatory Visit: Payer: Self-pay

## 2018-09-09 DIAGNOSIS — C3490 Malignant neoplasm of unspecified part of unspecified bronchus or lung: Secondary | ICD-10-CM | POA: Diagnosis not present

## 2018-09-09 DIAGNOSIS — C3492 Malignant neoplasm of unspecified part of left bronchus or lung: Secondary | ICD-10-CM | POA: Diagnosis not present

## 2018-09-09 MED ORDER — SODIUM CHLORIDE (PF) 0.9 % IJ SOLN
INTRAMUSCULAR | Status: AC
  Filled 2018-09-09: qty 50

## 2018-09-09 MED ORDER — IOHEXOL 300 MG/ML  SOLN
100.0000 mL | Freq: Once | INTRAMUSCULAR | Status: AC | PRN
  Administered 2018-09-09: 100 mL via INTRAVENOUS

## 2018-09-10 ENCOUNTER — Other Ambulatory Visit: Payer: Self-pay | Admitting: Medical Oncology

## 2018-09-10 DIAGNOSIS — E039 Hypothyroidism, unspecified: Secondary | ICD-10-CM

## 2018-09-10 DIAGNOSIS — C3492 Malignant neoplasm of unspecified part of left bronchus or lung: Secondary | ICD-10-CM

## 2018-09-11 ENCOUNTER — Inpatient Hospital Stay: Payer: Medicare Other

## 2018-09-11 ENCOUNTER — Encounter: Payer: Self-pay | Admitting: Physician Assistant

## 2018-09-11 ENCOUNTER — Inpatient Hospital Stay: Payer: Medicare Other | Attending: Internal Medicine

## 2018-09-11 ENCOUNTER — Other Ambulatory Visit: Payer: Self-pay

## 2018-09-11 ENCOUNTER — Inpatient Hospital Stay (HOSPITAL_BASED_OUTPATIENT_CLINIC_OR_DEPARTMENT_OTHER): Payer: Medicare Other | Admitting: Physician Assistant

## 2018-09-11 VITALS — BP 159/71 | HR 73

## 2018-09-11 VITALS — BP 160/69 | HR 88 | Temp 98.2°F | Resp 20 | Ht 66.0 in | Wt 192.1 lb

## 2018-09-11 DIAGNOSIS — Z86711 Personal history of pulmonary embolism: Secondary | ICD-10-CM

## 2018-09-11 DIAGNOSIS — E039 Hypothyroidism, unspecified: Secondary | ICD-10-CM

## 2018-09-11 DIAGNOSIS — C3492 Malignant neoplasm of unspecified part of left bronchus or lung: Secondary | ICD-10-CM

## 2018-09-11 DIAGNOSIS — G629 Polyneuropathy, unspecified: Secondary | ICD-10-CM | POA: Insufficient documentation

## 2018-09-11 DIAGNOSIS — Z90710 Acquired absence of both cervix and uterus: Secondary | ICD-10-CM | POA: Diagnosis not present

## 2018-09-11 DIAGNOSIS — C778 Secondary and unspecified malignant neoplasm of lymph nodes of multiple regions: Secondary | ICD-10-CM

## 2018-09-11 DIAGNOSIS — Z5112 Encounter for antineoplastic immunotherapy: Secondary | ICD-10-CM

## 2018-09-11 DIAGNOSIS — C787 Secondary malignant neoplasm of liver and intrahepatic bile duct: Secondary | ICD-10-CM

## 2018-09-11 DIAGNOSIS — C3412 Malignant neoplasm of upper lobe, left bronchus or lung: Secondary | ICD-10-CM | POA: Insufficient documentation

## 2018-09-11 DIAGNOSIS — I10 Essential (primary) hypertension: Secondary | ICD-10-CM

## 2018-09-11 DIAGNOSIS — Z79899 Other long term (current) drug therapy: Secondary | ICD-10-CM | POA: Insufficient documentation

## 2018-09-11 DIAGNOSIS — R5383 Other fatigue: Secondary | ICD-10-CM | POA: Insufficient documentation

## 2018-09-11 DIAGNOSIS — Z7901 Long term (current) use of anticoagulants: Secondary | ICD-10-CM

## 2018-09-11 DIAGNOSIS — Z5111 Encounter for antineoplastic chemotherapy: Secondary | ICD-10-CM

## 2018-09-11 DIAGNOSIS — E876 Hypokalemia: Secondary | ICD-10-CM

## 2018-09-11 LAB — CMP (CANCER CENTER ONLY)
ALT: 22 U/L (ref 0–44)
AST: 35 U/L (ref 15–41)
Albumin: 3.1 g/dL — ABNORMAL LOW (ref 3.5–5.0)
Alkaline Phosphatase: 124 U/L (ref 38–126)
Anion gap: 11 (ref 5–15)
BUN: 10 mg/dL (ref 8–23)
CO2: 20 mmol/L — ABNORMAL LOW (ref 22–32)
Calcium: 8.3 mg/dL — ABNORMAL LOW (ref 8.9–10.3)
Chloride: 108 mmol/L (ref 98–111)
Creatinine: 0.59 mg/dL (ref 0.44–1.00)
GFR, Est AFR Am: >60 mL/min (ref >60)
GFR, Estimated: >60 mL/min (ref >60)
Glucose, Bld: 98 mg/dL (ref 70–99)
Potassium: 3.3 mmol/L — ABNORMAL LOW (ref 3.5–5.1)
Sodium: 139 mmol/L (ref 135–145)
Total Bilirubin: 0.7 mg/dL (ref 0.3–1.2)
Total Protein: 6.6 g/dL (ref 6.5–8.1)

## 2018-09-11 LAB — CBC WITH DIFFERENTIAL (CANCER CENTER ONLY)
Abs Immature Granulocytes: 0.01 10*3/uL (ref 0.00–0.07)
Basophils Absolute: 0 10*3/uL (ref 0.0–0.1)
Basophils Relative: 0 %
Eosinophils Absolute: 0.2 10*3/uL (ref 0.0–0.5)
Eosinophils Relative: 4 %
HCT: 36 % (ref 36.0–46.0)
Hemoglobin: 11.9 g/dL — ABNORMAL LOW (ref 12.0–15.0)
Immature Granulocytes: 0 %
Lymphocytes Relative: 38 %
Lymphs Abs: 2 10*3/uL (ref 0.7–4.0)
MCH: 30.8 pg (ref 26.0–34.0)
MCHC: 33.1 g/dL (ref 30.0–36.0)
MCV: 93.3 fL (ref 80.0–100.0)
Monocytes Absolute: 0.7 10*3/uL (ref 0.1–1.0)
Monocytes Relative: 13 %
Neutro Abs: 2.5 10*3/uL (ref 1.7–7.7)
Neutrophils Relative %: 45 %
Platelet Count: 129 10*3/uL — ABNORMAL LOW (ref 150–400)
RBC: 3.86 MIL/uL — ABNORMAL LOW (ref 3.87–5.11)
RDW: 15.1 % (ref 11.5–15.5)
WBC Count: 5.4 10*3/uL (ref 4.0–10.5)
nRBC: 0 % (ref 0.0–0.2)

## 2018-09-11 LAB — TSH: TSH: <0.08 u[IU]/mL — ABNORMAL LOW (ref 0.308–3.960)

## 2018-09-11 LAB — TOTAL PROTEIN, URINE DIPSTICK: Protein, ur: 30 mg/dL — AB

## 2018-09-11 MED ORDER — SODIUM CHLORIDE 0.9 % IV SOLN
1200.0000 mg | Freq: Once | INTRAVENOUS | Status: AC
  Administered 2018-09-11: 11:00:00 1200 mg via INTRAVENOUS
  Filled 2018-09-11: qty 20

## 2018-09-11 MED ORDER — POTASSIUM CHLORIDE CRYS ER 20 MEQ PO TBCR: 20.0000 meq | EXTENDED_RELEASE_TABLET | Freq: Every day | ORAL | 0 refills | Status: DC

## 2018-09-11 MED ORDER — SPIRONOLACTONE 25 MG PO TABS: 25.0000 mg | ORAL_TABLET | Freq: Every day | ORAL | 0 refills | Status: DC

## 2018-09-11 MED ORDER — SODIUM CHLORIDE 0.9 % IV SOLN
14.4000 mg/kg | INTRAVENOUS | Status: DC
  Administered 2018-09-11: 11:00:00 1400 mg via INTRAVENOUS
  Filled 2018-09-11: qty 48

## 2018-09-11 MED ORDER — SODIUM CHLORIDE 0.9 % IV SOLN
Freq: Once | INTRAVENOUS | Status: AC
  Administered 2018-09-11: 10:00:00 via INTRAVENOUS
  Filled 2018-09-11: qty 250

## 2018-09-11 NOTE — Patient Instructions (Addendum)
[facility] Discharge Instructions for Patients Receiving Chemotherapy  Today you received the following chemotherapy agents:  atezolizumab (Tecentriq), bevacizumab (Avastin)  To help prevent nausea and vomiting after your treatment, we encourage you to take your nausea medication as prescribed.   If you develop nausea and vomiting that is not controlled by your nausea medication, call the clinic.   BELOW ARE SYMPTOMS THAT SHOULD BE REPORTED IMMEDIATELY:  *FEVER GREATER THAN 100.5 F  *CHILLS WITH OR WITHOUT FEVER  NAUSEA AND VOMITING THAT IS NOT CONTROLLED WITH YOUR NAUSEA MEDICATION  *UNUSUAL SHORTNESS OF BREATH  *UNUSUAL BRUISING OR BLEEDING  TENDERNESS IN MOUTH AND THROAT WITH OR WITHOUT PRESENCE OF ULCERS  *URINARY PROBLEMS  *BOWEL PROBLEMS  UNUSUAL RASH Items with * indicate a potential emergency and should be followed up as soon as possible.  Feel free to call the clinic should you have any questions or concerns. The clinic phone number is [phone].  Please show the [facility] at check-in to the Emergency Department and triage nurse.    Coronavirus (COVID-19) Are you at risk?  Are you at risk for the Coronavirus (COVID-19)?  To be considered HIGH RISK for Coronavirus (COVID-19), you have to meet the following criteria:  . Traveled to [place], [place], [place], [place] or [place]; or in the [place] to [place], [place], [place], or [place]; and have fever, cough, and shortness of breath within the last 2 weeks of travel OR . Been in close contact with a person diagnosed with COVID-19 within the last 2 weeks and have fever, cough, and shortness of breath . IF YOU DO NOT MEET THESE CRITERIA, YOU ARE CONSIDERED LOW RISK FOR COVID-19.  What to do if you are HIGH RISK for COVID-19?  Marland Kitchen If you are having a medical emergency, call 911. . Seek medical care right away. Before you go to a doctor's office, urgent care or emergency  department, call ahead and tell them about your recent travel, contact with someone diagnosed with COVID-19, and your symptoms. You should receive instructions from your physician's office regarding next steps of care.  . When you arrive at healthcare provider, tell the healthcare staff immediately you have returned from visiting [place], [place], [place], [place] or [place]; or traveled in the [place] to [place], [place], [place], or [place]; in the last two weeks or you have been in close contact with a person diagnosed with COVID-19 in the last 2 weeks.   . Tell the health care staff about your symptoms: fever, cough and shortness of breath. . After you have been seen by a medical provider, you will be either: o Tested for (COVID-19) and discharged home on quarantine except to seek medical care if symptoms worsen, and asked to  - Stay home and avoid contact with others until you get your results (4-5 days)  - Avoid travel on public transportation if possible (such as bus, train, or airplane) or o Sent to the Emergency Department by EMS for evaluation, COVID-19 testing, and possible admission depending on your condition and test results.  What to do if you are LOW RISK for COVID-19?  Reduce your risk of any infection by using the same precautions used for avoiding the common cold or flu:  Marland Kitchen Wash your hands often with soap and warm water for at least 20 seconds.  If soap and water are not readily available, use an alcohol-based hand sanitizer with at least 60% alcohol.  Marland Kitchen  If coughing or sneezing, cover your mouth and nose by coughing or sneezing into the elbow areas of your shirt or coat, into a tissue or into your sleeve (not your hands). . Avoid shaking hands with others and consider head nods or verbal greetings only. . Avoid touching your eyes, nose, or mouth with unwashed hands.  . Avoid close contact with people who are sick. . Avoid places or events with large numbers of people  in one location, like concerts or sporting events. . Carefully consider travel plans you have or are making. . If you are planning any travel outside or inside the [place], visit the CDC's Travelers' Health webpage for the latest health notices. . If you have some symptoms but not all symptoms, continue to monitor at home and seek medical attention if your symptoms worsen. . If you are having a medical emergency, call 911.   [place] / e-Visit: [email or web address]         [facility]: [phone]  [facility]: [phone]                   [facility]: [phone]   Hypokalemia Hypokalemia means that the amount of potassium in the blood is lower than normal.Potassium is a chemical that helps regulate the amount of fluid in the body (electrolyte). It also stimulates muscle tightening (contraction) and helps nerves work properly.Normally, most of the body's potassium is inside of cells, and only a very small amount is in the blood. Because the amount in the blood is so small, minor changes to potassium levels in the blood can be life-threatening. What are the causes? This condition may be caused by:  Antibiotic medicine.  Diarrhea or vomiting. Taking too much of a medicine that helps you have a bowel movement (laxative) can cause diarrhea and lead to hypokalemia.  Chronic kidney disease (CKD).  Medicines that help the body get rid of excess fluid (diuretics).  Eating disorders, such as bulimia.  Low magnesium levels in the body.  Sweating a lot. What are the signs or symptoms? Symptoms of this condition include:  Weakness.  Constipation.  Fatigue.  Muscle cramps.  Mental confusion.  Skipped heartbeats or irregular heartbeat (palpitations).  Tingling or numbness. How is this diagnosed? This condition is diagnosed with a blood  test. How is this treated? Hypokalemia can be treated by taking potassium supplements by mouth or adjusting the medicines that you take. Treatment may also include eating more foods that contain a lot of potassium. If your potassium level is very low, you may need to get potassium through an IV tube in one of your veins and be monitored in the hospital. Follow these instructions at home:   Take over-the-counter and prescription medicines only as told by your health care provider. This includes vitamins and supplements.  Eat a healthy diet. A healthy diet includes fresh fruits and vegetables, whole grains, healthy fats, and lean proteins.  If instructed, eat more foods that contain a lot of potassium, such as: ? Nuts, such as peanuts and pistachios. ? Seeds, such as sunflower seeds and pumpkin seeds. ? Peas, lentils, and lima beans. ? Whole grain and bran cereals and breads. ? Fresh fruits and vegetables, such as apricots, avocado, bananas, cantaloupe, kiwi, oranges, tomatoes, asparagus, and potatoes. ? Orange juice. ? Tomato juice. ? Red meats. ? Yogurt.  Keep all follow-up visits as told by your health care provider. This  is important. Contact a health care provider if:  You have weakness that gets worse.  You feel your heart pounding or racing.  You vomit.  You have diarrhea.  You have diabetes (diabetes mellitus) and you have trouble keeping your blood sugar (glucose) in your target range. Get help right away if:  You have chest pain.  You have shortness of breath.  You have vomiting or diarrhea that lasts for more than 2 days.  You faint. This information is not intended to replace advice given to you by your health care provider. Make sure you discuss any questions you have with your health care provider. Document Released: 05/15/2005 Document Revised: 01/01/2016 Document Reviewed: 01/01/2016 Elsevier Interactive Patient Education  2019 [employer].

## 2018-09-11 NOTE — Progress Notes (Signed)
[facility] OFFICE PROGRESS NOTE  System, Pcp Not In No address on file  DIAGNOSIS: Stage IV (T1b, N3, M1b) non-small cell lung cancer, adenocarcinoma with positive EGFR mutation in exon 21 ([id number]) and recently found to have T790M resistant mutation, presented with left upper lobe lung mass in addition to left hilar and bilateral mediastinal lymphadenopathy as well as liver metastasis diagnosed in May of 2015.  PRIOR THERAPY:  1) Tarceva 150 mg by mouth daily. Started 11/25/2013. Status post 5 months of treatment discontinued today secondary to intolerance with persistent paronychia. 2) Tarceva 100 mg by mouth daily. Started 06/03/2014. Status post 8 months of treatment.  3) stereotactic radiotherapy to liver lesions under the care of Dr. Tammi Klippel. Last dose 03/02/2017 4) Tagrisso 80 mg by mouth daily status post 34 months of treatment. First dose was given 03/04/2015. This was discontinued secondary to disease progression. 5) Systemic chemotherapy with carboplatin for AUC of 6, paclitaxel 200 mg/M2, Avastin 15 mg/KG and Tecentriq 1200 mg IV every 3 weeks with Neulasta support. First dose 02/21/2018. Status post 4 cycles. Starting from cycle #4 the dose of carboplatin will be for AUC of 5 and paclitaxel 150 mg/M2.  CURRENT THERAPY: Maintenance treatment with Avastin 15 mg/KG and Tecentriq 1200 mg IV every 3 weeks. First cycle June 01, 2018.Status post 3 cycles.  INTERVAL HISTORY: Debra Hodges 68 y.o. female returns to the clinic for a follow-up visit.  The patient is feeling fairly well today but continues to experience peripheral neuropathy and shortness of breath with exertion. She denies any fever, chills, night sweats, or weight loss.  She denies any chest pain, cough, or hemoptysis.  She denies any nausea, vomiting, diarrhea, or constipation.  She denies any headache or visual changes.  She denies any rashes or skin changes.  She is currently on Eliquis for her history  of a pulmonary embolism.  She denies any significant bleeding or bruising including melena, hematochezia, epistaxis, or hematuria. The patient did mention that her PCP is located in [place] and she has not been able to follow up with them regarding getting a refill of her spironolactone for her blood pressure and management of her history of hypokalemia. She continues to tolerate her treatment well except for some fatigue.  She recently had a restaging CT scan performed.  She is here for evaluation and to review her scan results.   MEDICAL HISTORY: Past Medical History:  Diagnosis Date  . High blood pressure   . Liver lesion 06/26/2016  . Non-small cell carcinoma of lung, stage 4 ([place]) dx'd 10/2013    ALLERGIES:  is allergic to peanut-containing drug products and penicillins.  MEDICATIONS:  Current Outpatient Medications  Medication Sig Dispense Refill  . acetaminophen (TYLENOL) 325 MG tablet Take 650 mg by mouth every 6 (six) hours as needed for mild pain. Reported on 06/15/2015    . apixaban (ELIQUIS) 5 MG TABS tablet Take 1 tablet (5 mg total) by mouth 2 (two) times daily. 60 tablet 2  . calcium carbonate (OS-CAL) 600 MG TABS tablet Take 600 mg by mouth daily.     Marland Kitchen CLIMARA 0.1 MG/24HR patch Place 1 patch onto the skin once a week. On Thursday    . gabapentin (NEURONTIN) 100 MG capsule TAKE 3 CAPSULES(300 MG) BY MOUTH THREE TIMES DAILY 90 capsule 1  . levothyroxine (SYNTHROID, LEVOTHROID) 50 MCG tablet Take 50 mcg by mouth at bedtime.     Marland Kitchen loperamide (IMODIUM) 2 MG capsule Take 2 mg  by mouth daily as needed for diarrhea or loose stools. Reported on 11/22/2015    . loratadine (CLARITIN) 10 MG tablet Take 10 mg by mouth daily.    . Multiple Vitamins-Calcium (ONE-A-DAY WOMENS PO) Take 1 tablet by mouth daily.    Marland Kitchen oxyCODONE-acetaminophen (PERCOCET/ROXICET) 5-325 MG tablet Take 1 tablet by mouth every 6 (six) hours as needed for severe pain. 60 tablet 0  . potassium chloride SA  (K-DUR,KLOR-CON) 20 MEQ tablet Take 20 mEq by mouth at bedtime.     . prochlorperazine (COMPAZINE) 10 MG tablet TAKE 1 TABLET(10 MG) BY MOUTH EVERY 6 HOURS AS NEEDED FOR NAUSEA OR VOMITING (Patient taking differently: Take 10 mg by mouth every 6 (six) hours as needed for nausea or vomiting. ) 385 tablet 3  . Sodium Fluoride (CLINPRO 5000) 1.1 % PSTE Place 1 application onto teeth 2 (two) times daily.     Marland Kitchen spironolactone (ALDACTONE) 25 MG tablet Take 1 tablet (25 mg total) by mouth daily. 30 tablet 0  . vitamin C (ASCORBIC ACID) 500 MG tablet Take 500 mg by mouth daily.     No current facility-administered medications for this visit.     SURGICAL HISTORY:  Past Surgical History:  Procedure Laterality Date  . ABDOMINAL HYSTERECTOMY    . BREAST BIOPSY     Right breast  . VIDEO BRONCHOSCOPY Bilateral 10/27/2013   Procedure: VIDEO BRONCHOSCOPY WITH FLUORO;  Surgeon: Collene Gobble, MD;  Location: [facility];  Service: Cardiopulmonary;  Laterality: Bilateral;    REVIEW OF SYSTEMS:   Review of Systems  Constitutional: Positive for fatigue. Negative for appetite change, chills, fever and unexpected weight change.  HENT:   Negative for mouth sores, nosebleeds, sore throat and trouble swallowing.   Eyes: Negative for eye problems and icterus.  Respiratory: Positive for baseline shortness of breath with exertion. Negative for cough, hemoptysis, and wheezing.  Cardiovascular: Positive for bilateral leg swelling. Negative for chest pain.  Gastrointestinal: Negative for abdominal pain, constipation, diarrhea, nausea and vomiting.  Genitourinary: Negative for bladder incontinence, difficulty urinating, dysuria, frequency and hematuria.   Musculoskeletal: Negative for back pain, gait problem, neck pain and neck stiffness.  Skin: Negative for itching and rash.  Neurological: Negative for dizziness, extremity weakness, gait problem, headaches, light-headedness and seizures.  Hematological: Negative for  adenopathy. Does not bruise/bleed easily.  Psychiatric/Behavioral: Negative for confusion, depression and sleep disturbance. The patient is not nervous/anxious.     PHYSICAL EXAMINATION:  Blood pressure (!) 160/69, pulse 88, temperature 98.2 F (36.8 C), temperature source Oral, resp. rate 20, height '5\' 6"'$  (1.676 m), weight 192 lb 1.6 oz (87.1 kg), SpO2 99 %.  ECOG PERFORMANCE STATUS: 1 - Symptomatic but completely ambulatory  Physical Exam  Constitutional: Oriented to person, place, and time and well-developed, well-nourished, and in no distress. No distress.  HENT:  Head: Normocephalic and atraumatic.  Mouth/Throat: Oropharynx is clear and moist. No oropharyngeal exudate.  Eyes: Conjunctivae are normal. Right eye exhibits no discharge. Left eye exhibits no discharge. No scleral icterus.  Neck: Normal range of motion. Neck supple.  Cardiovascular: Normal rate, regular rhythm, normal heart sounds and intact distal pulses.   Pulmonary/Chest: Effort normal and breath sounds normal. No respiratory distress. No wheezes. No rales.  Abdominal: Soft. Bowel sounds are normal. Exhibits no distension and no mass. There is no tenderness.  Musculoskeletal: Positive for bilateral lower extremity edema. Normal range of motion.  Lymphadenopathy:    No cervical adenopathy.  Neurological: Alert and oriented to person,  place, and time. Exhibits normal muscle tone. Gait normal. Coordination normal.  Skin: Skin is warm and dry. No rash noted. Not diaphoretic. No erythema. No pallor.  Psychiatric: Mood, memory and judgment normal.  Vitals reviewed.  LABORATORY DATA: Lab Results  Component Value Date   WBC 5.4 09/11/2018   HGB 11.9 (L) 09/11/2018   HCT 36.0 09/11/2018   MCV 93.3 09/11/2018   PLT 129 (L) 09/11/2018      Chemistry      Component Value Date/Time   NA 139 09/11/2018 0751   NA 141 04/30/2017 1541   K 3.3 (L) 09/11/2018 0751   K 4.1 04/30/2017 1541   CL 108 09/11/2018 0751   CO2 20  (L) 09/11/2018 0751   CO2 26 04/30/2017 1541   BUN 10 09/11/2018 0751   BUN 14.1 04/30/2017 1541   CREATININE 0.59 09/11/2018 0751   CREATININE 0.8 04/30/2017 1541      Component Value Date/Time   CALCIUM 8.3 (L) 09/11/2018 0751   CALCIUM 9.6 04/30/2017 1541   ALKPHOS 124 09/11/2018 0751   ALKPHOS 105 04/30/2017 1541   AST 35 09/11/2018 0751   AST 40 (H) 04/30/2017 1541   ALT 22 09/11/2018 0751   ALT 27 04/30/2017 1541   BILITOT 0.7 09/11/2018 0751   BILITOT 0.55 04/30/2017 1541       RADIOGRAPHIC STUDIES:  Ct Chest W Contrast  Result Date: 09/10/2018 CLINICAL DATA:  Restaging metastatic lung cancer EXAM: CT CHEST, ABDOMEN, AND PELVIS WITH CONTRAST TECHNIQUE: Multidetector CT imaging of the chest, abdomen and pelvis was performed following the standard protocol during bolus administration of intravenous contrast. CONTRAST:  123m OMNIPAQUE IOHEXOL 300 MG/ML SOLN, additional oral enteric contrast COMPARISON:  07/08/2018, 04/29/2018, 02/08/2018 FINDINGS: CT CHEST FINDINGS Cardiovascular: No significant vascular findings. Normal heart size. No pericardial effusion. Mediastinum/Nodes: No enlarged mediastinal, hilar, or axillary lymph nodes. Unchanged soft tissue about the left hilum. Calcified nodule of the right lobe of thyroid. Trachea, and esophagus demonstrate no significant findings. Lungs/Pleura: No significant change in soft tissue about the left hilum and spiculated masses/post treatment change of the suprahilar left lung. Unchanged small nodules of the left upper lobe, nonspecific, for example a 3 mm nodule (series 4, image 37). No pleural effusion or pneumothorax. Musculoskeletal: No chest wall mass or suspicious bone lesions identified. CT ABDOMEN PELVIS FINDINGS Hepatobiliary: No significant change in multiple low-attenuation lesions of the liver, an index lesion of the left lobe measuring 1.7 cm, unchanged prior (series 2, image 4). No gallstones, gallbladder wall thickening, or  biliary dilatation. Pancreas: Unremarkable. No pancreatic ductal dilatation or surrounding inflammatory changes. Spleen: Normal in size without focal abnormality. Adrenals/Urinary Tract: Adrenal glands are unremarkable. Nonobstructive left renal calculus. Bladder is unremarkable. Stomach/Bowel: Stomach is within normal limits. No evidence of bowel wall thickening, distention, or inflammatory changes. Vascular/Lymphatic: Calcific atherosclerosis the abdominal aorta. No enlarged abdominal or pelvic lymph nodes. Reproductive: No mass or other abnormality. Status post hysterectomy. Other: No abdominal wall hernia or abnormality. No abdominopelvic ascites. Musculoskeletal: No acute or significant osseous findings. IMPRESSION: 1. No significant change in soft tissue about the left hilum and spiculated masses/post treatment change of the suprahilar left lung. Unchanged small nodules of the left upper lobe, nonspecific, for example a 3 mm nodule (series 4, image 37). 2. No significant change in multiple low-attenuation metastatic lesions of the liver, an index lesion of the left lobe measuring 1.7 cm, unchanged prior (series 2, image 4). These lesions are however significantly decreased in size  in comparison to multiple prior examinations. 3. No evidence of new metastatic disease in the chest, abdomen, or pelvis. Electronically Signed   By: Eddie Candle M.D.   On: 09/10/2018 09:10   Ct Abdomen Pelvis W Contrast  Result Date: 09/10/2018 CLINICAL DATA:  Restaging metastatic lung cancer EXAM: CT CHEST, ABDOMEN, AND PELVIS WITH CONTRAST TECHNIQUE: Multidetector CT imaging of the chest, abdomen and pelvis was performed following the standard protocol during bolus administration of intravenous contrast. CONTRAST:  11m OMNIPAQUE IOHEXOL 300 MG/ML SOLN, additional oral enteric contrast COMPARISON:  07/08/2018, 04/29/2018, 02/08/2018 FINDINGS: CT CHEST FINDINGS Cardiovascular: No significant vascular findings. Normal heart  size. No pericardial effusion. Mediastinum/Nodes: No enlarged mediastinal, hilar, or axillary lymph nodes. Unchanged soft tissue about the left hilum. Calcified nodule of the right lobe of thyroid. Trachea, and esophagus demonstrate no significant findings. Lungs/Pleura: No significant change in soft tissue about the left hilum and spiculated masses/post treatment change of the suprahilar left lung. Unchanged small nodules of the left upper lobe, nonspecific, for example a 3 mm nodule (series 4, image 37). No pleural effusion or pneumothorax. Musculoskeletal: No chest wall mass or suspicious bone lesions identified. CT ABDOMEN PELVIS FINDINGS Hepatobiliary: No significant change in multiple low-attenuation lesions of the liver, an index lesion of the left lobe measuring 1.7 cm, unchanged prior (series 2, image 4). No gallstones, gallbladder wall thickening, or biliary dilatation. Pancreas: Unremarkable. No pancreatic ductal dilatation or surrounding inflammatory changes. Spleen: Normal in size without focal abnormality. Adrenals/Urinary Tract: Adrenal glands are unremarkable. Nonobstructive left renal calculus. Bladder is unremarkable. Stomach/Bowel: Stomach is within normal limits. No evidence of bowel wall thickening, distention, or inflammatory changes. Vascular/Lymphatic: Calcific atherosclerosis the abdominal aorta. No enlarged abdominal or pelvic lymph nodes. Reproductive: No mass or other abnormality. Status post hysterectomy. Other: No abdominal wall hernia or abnormality. No abdominopelvic ascites. Musculoskeletal: No acute or significant osseous findings. IMPRESSION: 1. No significant change in soft tissue about the left hilum and spiculated masses/post treatment change of the suprahilar left lung. Unchanged small nodules of the left upper lobe, nonspecific, for example a 3 mm nodule (series 4, image 37). 2. No significant change in multiple low-attenuation metastatic lesions of the liver, an index lesion  of the left lobe measuring 1.7 cm, unchanged prior (series 2, image 4). These lesions are however significantly decreased in size in comparison to multiple prior examinations. 3. No evidence of new metastatic disease in the chest, abdomen, or pelvis. Electronically Signed   By: AEddie CandleM.D.   On: 09/10/2018 09:10     ASSESSMENT/PLAN:  This is a very pleasant 68year old Caucasian female with stage IV non-small cell lung cancer, adenocarcinoma with a positive EGFR mutation in exon 20 (([id number] diagnosed in May 2015.  She is status post treatment with Tarceva for 13 months.  This was discontinued secondary to disease progression and development of T790M resistant mutation. The patient underwent treatment with Tagrisso 80 mg p.o. daily. She is status post 34 months of treatment.  This was discontinued due to progressive disease, especially in the liver. The patient was referred to Dr. MTammi Klippelfrom radiation oncology for consideration of palliative radiotherapy to the enlarging left upper lobe suprahilar mass to avoid further obstruction of her bronchial tree.  She was then started on systemic chemotherapy with carboplatin for an AUC of 6, paclitaxel 200 mg/m IV, Avastin 15 mg/kg as well as Tecentriq during 1200 mg IV every 3 weeks status post 4 cycles. She had to reduce her dose of  carboplatin and paclitaxel during cycle #4.  Starting from cycle #5 the patient had been on maintenance treatment with Avastin 15 mg/kg as well as Tecentriq 1200 milligrams IV every 3 weeks.  First dose was on June 21, 2018.  She is status post 5 cycles of maintenance therapy.  The patient was evaluated with Dr. Julien Nordmann today. Patient is feeling fair today continues to experience peripheral neuropathy for which she takes Neurontin and fatigue secondary to her treatment.  Recommend she proceed with cycle #6 of maintenance as scheduled.   Her blood pressure was found to be elevated at 160/69.  The patient states this is  secondary to being out of her blood pressure medication and being anxious.  The patient takes Spironolactone for hypertension.  Her primary care provider is in [place] and she has been unable to see him during the current COVID pandemic.  We will recheck her blood pressure today.  Additionally, I will send a month supply of her blood pressure medication to her pharmacy until she can be evaluated by her primary care provider.  She was advised to start taking her blood pressure medicine as prescribed before her future appointments with [place].  The patient currently takes 20 mEq of potassium once a day which is managed by her primary care provider. The patient's potassium was found to be 3.3 today.  She is almost out of her current potassium prescription and only has about 3 or 4 more pills left. I will send a short refill of potassium to the patient's pharmacy until she can be seen by her primary care provider or establish care with a local PCP. I suspect that restarting her spironolactone may improve her hypokalemia. She agreed to contact her PCP regarding optimal management and for their recommendations. We will recheck CMP on her routine labs at her next visit.   She will continue taking Neurontin 100 mg p.o. 3 times daily for her peripheral neuropathy.  She will continue taking Elliquis for her history of a pulmonary embolism.   The patient was advised to call immediately if she has any concerning symptoms in the interval. The patient voices understanding of current disease status and treatment options and is in agreement with the current care plan. All questions were answered. The patient knows to call the clinic with any problems, questions or concerns. We can certainly see the patient much sooner if necessary   No problem-specific Assessment & Plan notes found for this encounter.   No orders of the defined types were placed in this encounter.     L ,  PA-C 09/11/18  ADDENDUM: Hematology/Oncology Attending: I had a face-to-face encounter with the patient today.  I recommended her care plan.  This is a very pleasant 68 years old white female with metastatic non-small cell lung cancer, adenocarcinoma diagnosed in May 2019 with EGFR mutation in exon 21 status post several targeted therapy regimens but unfortunately she had disease progression after several years of treatment. The patient was started on systemic chemotherapy with carboplatin, paclitaxel, Avastin and Tecentriq for 4 cycles with partial response.  She is currently undergoing maintenance treatment with Tecentriq and a Avastin status post 5 cycles. She has been tolerating this treatment well with no concerning adverse effect except for the residual peripheral neuropathy from her previous chemotherapy. She had repeat CT scan of the chest, abdomen and pelvis performed recently.  I personally and independently reviewed the scans and discussed the results with the patient today. Her scan showed no  concerning findings for disease progression. I recommended for the patient to continue her current maintenance treatment with Avastin and Tecentriq she will proceed with cycle #6 today. For the history of pulmonary embolism, the patient will continue on Eliquis. For the hypertension, will give the patient a refill of her blood pressure medication until she is able to reach out to her primary care physician for further evaluation and refill of her medication. The patient will come back for follow-up visit in 3 weeks for evaluation before the next cycle of her treatment. She was advised to call immediately if she has any concerning symptoms in the interval.  Disclaimer: This note was dictated with voice recognition software. Similar sounding words can inadvertently be transcribed and may be missed upon review. Eilleen Kempf, MD 09/11/18

## 2018-09-20 ENCOUNTER — Telehealth: Payer: Self-pay | Admitting: Medical Oncology

## 2018-09-20 DIAGNOSIS — S199XXA Unspecified injury of neck, initial encounter: Secondary | ICD-10-CM | POA: Diagnosis not present

## 2018-09-20 DIAGNOSIS — C787 Secondary malignant neoplasm of liver and intrahepatic bile duct: Secondary | ICD-10-CM | POA: Diagnosis present

## 2018-09-20 DIAGNOSIS — R4182 Altered mental status, unspecified: Secondary | ICD-10-CM | POA: Diagnosis not present

## 2018-09-20 DIAGNOSIS — T451X5S Adverse effect of antineoplastic and immunosuppressive drugs, sequela: Secondary | ICD-10-CM | POA: Diagnosis not present

## 2018-09-20 DIAGNOSIS — C3491 Malignant neoplasm of unspecified part of right bronchus or lung: Secondary | ICD-10-CM | POA: Diagnosis present

## 2018-09-20 DIAGNOSIS — Z86711 Personal history of pulmonary embolism: Secondary | ICD-10-CM | POA: Diagnosis not present

## 2018-09-20 DIAGNOSIS — C3492 Malignant neoplasm of unspecified part of left bronchus or lung: Secondary | ICD-10-CM | POA: Diagnosis not present

## 2018-09-20 DIAGNOSIS — G620 Drug-induced polyneuropathy: Secondary | ICD-10-CM | POA: Diagnosis present

## 2018-09-20 DIAGNOSIS — Z20828 Contact with and (suspected) exposure to other viral communicable diseases: Secondary | ICD-10-CM | POA: Diagnosis present

## 2018-09-20 DIAGNOSIS — I358 Other nonrheumatic aortic valve disorders: Secondary | ICD-10-CM | POA: Diagnosis not present

## 2018-09-20 DIAGNOSIS — E860 Dehydration: Secondary | ICD-10-CM | POA: Diagnosis not present

## 2018-09-20 DIAGNOSIS — Z923 Personal history of irradiation: Secondary | ICD-10-CM | POA: Diagnosis not present

## 2018-09-20 DIAGNOSIS — R4020 Unspecified coma: Secondary | ICD-10-CM | POA: Diagnosis not present

## 2018-09-20 DIAGNOSIS — E0590 Thyrotoxicosis, unspecified without thyrotoxic crisis or storm: Secondary | ICD-10-CM | POA: Diagnosis present

## 2018-09-20 DIAGNOSIS — R404 Transient alteration of awareness: Secondary | ICD-10-CM | POA: Diagnosis not present

## 2018-09-20 DIAGNOSIS — C7932 Secondary malignant neoplasm of cerebral meninges: Secondary | ICD-10-CM | POA: Diagnosis present

## 2018-09-20 DIAGNOSIS — E039 Hypothyroidism, unspecified: Secondary | ICD-10-CM | POA: Diagnosis not present

## 2018-09-20 DIAGNOSIS — Z880 Allergy status to penicillin: Secondary | ICD-10-CM | POA: Diagnosis not present

## 2018-09-20 DIAGNOSIS — R0689 Other abnormalities of breathing: Secondary | ICD-10-CM | POA: Diagnosis not present

## 2018-09-20 DIAGNOSIS — I10 Essential (primary) hypertension: Secondary | ICD-10-CM | POA: Diagnosis present

## 2018-09-20 DIAGNOSIS — R55 Syncope and collapse: Secondary | ICD-10-CM | POA: Diagnosis not present

## 2018-09-20 DIAGNOSIS — Z9221 Personal history of antineoplastic chemotherapy: Secondary | ICD-10-CM | POA: Diagnosis not present

## 2018-09-20 DIAGNOSIS — Z7901 Long term (current) use of anticoagulants: Secondary | ICD-10-CM | POA: Diagnosis not present

## 2018-09-20 DIAGNOSIS — E872 Acidosis: Secondary | ICD-10-CM | POA: Diagnosis present

## 2018-09-20 DIAGNOSIS — R52 Pain, unspecified: Secondary | ICD-10-CM | POA: Diagnosis not present

## 2018-09-20 DIAGNOSIS — E876 Hypokalemia: Secondary | ICD-10-CM | POA: Diagnosis present

## 2018-09-20 NOTE — Telephone Encounter (Signed)
Husband confirmed that pt is admitted to [facility] last night, found  unresponsive ,dtr started CPR and EMS  transported to [facility]. She is admitted but is "not in intensive care now".

## 2018-09-21 ENCOUNTER — Other Ambulatory Visit: Payer: Self-pay | Admitting: Oncology

## 2018-09-23 ENCOUNTER — Other Ambulatory Visit: Payer: Self-pay | Admitting: Internal Medicine

## 2018-09-23 ENCOUNTER — Telehealth: Payer: Self-pay | Admitting: Medical Oncology

## 2018-09-23 DIAGNOSIS — C3412 Malignant neoplasm of upper lobe, left bronchus or lung: Secondary | ICD-10-CM

## 2018-09-23 MED ORDER — ONDANSETRON HCL 4 MG/2ML IJ SOLN
4.00 | INTRAMUSCULAR | Status: DC
Start: ? — End: 2018-09-23

## 2018-09-23 MED ORDER — OXYCODONE-ACETAMINOPHEN 5-325 MG PO TABS
1.00 | ORAL_TABLET | ORAL | Status: DC
Start: ? — End: 2018-09-23

## 2018-09-23 MED ORDER — GABAPENTIN 300 MG PO CAPS
300.00 | ORAL_CAPSULE | ORAL | Status: DC
Start: 2018-09-21 — End: 2018-09-23

## 2018-09-23 MED ORDER — ACETAMINOPHEN 325 MG PO TABS
650.00 | ORAL_TABLET | ORAL | Status: DC
Start: ? — End: 2018-09-23

## 2018-09-23 MED ORDER — POTASSIUM CHLORIDE CRYS ER 20 MEQ PO TBCR
20.00 | EXTENDED_RELEASE_TABLET | ORAL | Status: DC
Start: 2018-09-22 — End: 2018-09-23

## 2018-09-23 MED ORDER — FAMOTIDINE 20 MG PO TABS
20.00 | ORAL_TABLET | ORAL | Status: DC
Start: 2018-09-21 — End: 2018-09-23

## 2018-09-23 MED ORDER — SODIUM CHLORIDE 0.9 % IV SOLN
125.00 | INTRAVENOUS | Status: DC
Start: ? — End: 2018-09-23

## 2018-09-23 MED ORDER — WEIGHT LOSS DAILY MULTI PO TABS
5.00 | ORAL_TABLET | ORAL | Status: DC
Start: 2018-09-21 — End: 2018-09-23

## 2018-09-23 NOTE — Telephone Encounter (Signed)
New medications- Dexamethasone 4 mg bid x 14 days and Famotidine 20 mg q day. XRT  appt may 5th. I told pt to keep her appt with [facility] for next week.

## 2018-09-23 NOTE — Telephone Encounter (Signed)
LM to return to ask if pt is on decadron and to tell her referral made to XRT.

## 2018-09-25 DIAGNOSIS — C787 Secondary malignant neoplasm of liver and intrahepatic bile duct: Secondary | ICD-10-CM | POA: Diagnosis not present

## 2018-09-25 DIAGNOSIS — C7931 Secondary malignant neoplasm of brain: Secondary | ICD-10-CM | POA: Diagnosis not present

## 2018-09-25 DIAGNOSIS — I10 Essential (primary) hypertension: Secondary | ICD-10-CM | POA: Diagnosis not present

## 2018-09-25 DIAGNOSIS — C3490 Malignant neoplasm of unspecified part of unspecified bronchus or lung: Secondary | ICD-10-CM | POA: Diagnosis not present

## 2018-09-25 DIAGNOSIS — R590 Localized enlarged lymph nodes: Secondary | ICD-10-CM | POA: Diagnosis not present

## 2018-09-25 DIAGNOSIS — Z9181 History of falling: Secondary | ICD-10-CM | POA: Diagnosis not present

## 2018-09-25 DIAGNOSIS — Z86711 Personal history of pulmonary embolism: Secondary | ICD-10-CM | POA: Diagnosis not present

## 2018-09-26 NOTE — Progress Notes (Signed)
Location/Histology of Brain Tumor: MRI findings significant for new intracranial metastases and diffuse leptomeningeal spread  Patient presented with symptoms of:  altered mental status and loss of consciousness.   Past or anticipated interventions, if any, per neurosurgery: None at this time.  Past or anticipated interventions, if any, per medical oncology: PRIOR THERAPY:  1) Tarceva 150 mg by mouth daily. Started 11/25/2013. Status post 5 months of treatment discontinued today secondary to intolerance with persistent paronychia. 2) Tarceva 100 mg by mouth daily. Started 06/03/2014. Status post 8 months of treatment.  3) stereotactic radiotherapy to liver lesions under the care of Dr. Tammi Klippel. Last dose 03/02/2017 4) Tagrisso 80 mg by mouth daily status post 34 months of treatment. First dose was given 03/04/2015. This was discontinued secondary to disease progression. 5) Systemic chemotherapy with carboplatin for AUC of 6, paclitaxel 200 mg/M2, Avastin 15 mg/KG and Tecentriq 1200 mg IV every 3 weeks with Neulasta support. First dose 02/21/2018. Status post 4 cycles. Starting from cycle #4 the dose of carboplatin will be for AUC of 5 and paclitaxel 150 mg/M2.  CURRENT THERAPY: Maintenance treatment with Avastin 15 mg/KG and Tecentriq 1200 mg IV every 3 weeks. First cycle January4, 2020.Status post3cycles.  Dose of Decadron, if applicable: dexAMETHasone 4 MG tablet Commonly known as: DECADRON Take 1 tablet (4 mg total) by mouth 2 (two) times a day with meals for 14 days.    Recent neurologic symptoms, if any:   Seizures: no  Headaches: yes, occasionally.  Nausea: yes, occasionally. Mostly associated with headaches.  Dizziness/ataxia: no, pt is very cautious with ambulating but she feels "uneasy"  Difficulty with hand coordination: yes, only when picking up pills (to take medications)  Focal numbness/weakness: pt has peripheral neuropathy from previous  chemotherapy.  Visual deficits/changes: yes, occasional blurry vision  Confusion/Memory deficits: no  Painful bone metastases at present, if any: N/A  SAFETY ISSUES: Prior radiation? Radiation treatment dates:   02/21/2017 - 03/02/2017  Site/dose:   The liver was treated to 50 Gy in 5 fractions of 10 Gy.  Beams/energy:   SBRT/SRT-VMAT // 10X-FFF Photon  The treatment was gated with patient respiration using implanted fiducial markers.  Radiation treatment dates:   11/19/2017-708/2019  Site/dose:   The primary LUL lesion and involved mediastinal adenopathy were treated to 30 Gy in 10 fractions of 3 Gy.  Beams/energy:   A five field 3D conformal treatment arrangement was used delivering 6 and 10 MV photons.  Daily image-guidance CT was used to align the treatment with the targeted volume   Pacemaker/ICD? No  Possible current pregnancy? No  Is the patient on methotrexate? No  Additional Complaints / other details: Pt presents today for initial consult with Dr. Sondra Come for Radiation Oncology. Pt is unaccompanied.   BP (!) 154/82 (BP Location: Left Arm, Patient Position: Sitting)   Pulse 79   Temp 98.1 F (36.7 C) (Oral)   Resp 20   SpO2 99%   Wt Readings from Last 3 Encounters:  09/11/18 192 lb 1.6 oz (87.1 kg)  07/31/18 192 lb 6.4 oz (87.3 kg)  07/11/18 189 lb 14.4 oz (86.1 kg)   Loma Sousa, RN BSN

## 2018-09-30 ENCOUNTER — Encounter: Payer: Self-pay | Admitting: Radiation Oncology

## 2018-09-30 ENCOUNTER — Ambulatory Visit
Admission: RE | Admit: 2018-09-30 | Discharge: 2018-09-30 | Disposition: A | Discharge status: Home / Self Care | Payer: Medicare Other | Source: Ambulatory Visit | Attending: Radiation Oncology | Admitting: Radiation Oncology

## 2018-09-30 ENCOUNTER — Other Ambulatory Visit: Payer: Self-pay

## 2018-09-30 DIAGNOSIS — Z79899 Other long term (current) drug therapy: Secondary | ICD-10-CM | POA: Diagnosis not present

## 2018-09-30 DIAGNOSIS — C787 Secondary malignant neoplasm of liver and intrahepatic bile duct: Secondary | ICD-10-CM | POA: Diagnosis not present

## 2018-09-30 DIAGNOSIS — C3490 Malignant neoplasm of unspecified part of unspecified bronchus or lung: Secondary | ICD-10-CM

## 2018-09-30 DIAGNOSIS — C7949 Secondary malignant neoplasm of other parts of nervous system: Secondary | ICD-10-CM | POA: Insufficient documentation

## 2018-09-30 DIAGNOSIS — Z7901 Long term (current) use of anticoagulants: Secondary | ICD-10-CM | POA: Diagnosis not present

## 2018-09-30 DIAGNOSIS — Z923 Personal history of irradiation: Secondary | ICD-10-CM | POA: Insufficient documentation

## 2018-09-30 DIAGNOSIS — C3412 Malignant neoplasm of upper lobe, left bronchus or lung: Secondary | ICD-10-CM | POA: Insufficient documentation

## 2018-09-30 NOTE — Patient Instructions (Signed)
Coronavirus (COVID-19) Are you at risk?  Are you at risk for the Coronavirus (COVID-19)?  To be considered HIGH RISK for Coronavirus (COVID-19), you have to meet the following criteria:  . Traveled to [place], [place], [place], [place] or [place]; or in the [place] to [place], [place], [place], or [place]; and have fever, cough, and shortness of breath within the last 2 weeks of travel OR . Been in close contact with a person diagnosed with COVID-19 within the last 2 weeks and have fever, cough, and shortness of breath . IF YOU DO NOT MEET THESE CRITERIA, YOU ARE CONSIDERED LOW RISK FOR COVID-19.  What to do if you are HIGH RISK for COVID-19?  . If you are having a medical emergency, call 911. . Seek medical care right away. Before you go to a doctor's office, urgent care or emergency department, call ahead and tell them about your recent travel, contact with someone diagnosed with COVID-19, and your symptoms. You should receive instructions from your physician's office regarding next steps of care.  . When you arrive at healthcare provider, tell the healthcare staff immediately you have returned from visiting [place], [place], [place], [place] or [place]; or traveled in the [place] to [place], [place], [place], or [place]; in the last two weeks or you have been in close contact with a person diagnosed with COVID-19 in the last 2 weeks.   . Tell the health care staff about your symptoms: fever, cough and shortness of breath. . After you have been seen by a medical provider, you will be either: o Tested for (COVID-19) and discharged home on quarantine except to seek medical care if symptoms worsen, and asked to  - Stay home and avoid contact with others until you get your results (4-5 days)  - Avoid travel on public transportation if possible (such as bus, train, or airplane) or o Sent to the Emergency Department by EMS for evaluation, COVID-19 testing, and possible  admission depending on your condition and test results.  What to do if you are LOW RISK for COVID-19?  Reduce your risk of any infection by using the same precautions used for avoiding the common cold or flu:  . Wash your hands often with soap and warm water for at least 20 seconds.  If soap and water are not readily available, use an alcohol-based hand sanitizer with at least 60% alcohol.  . If coughing or sneezing, cover your mouth and nose by coughing or sneezing into the elbow areas of your shirt or coat, into a tissue or into your sleeve (not your hands). . Avoid shaking hands with others and consider head nods or verbal greetings only. . Avoid touching your eyes, nose, or mouth with unwashed hands.  . Avoid close contact with people who are sick. . Avoid places or events with large numbers of people in one location, like concerts or sporting events. . Carefully consider travel plans you have or are making. . If you are planning any travel outside or inside the [place], visit the CDC's Travelers' Health webpage for the latest health notices. . If you have some symptoms but not all symptoms, continue to monitor at home and seek medical attention if your symptoms worsen. . If you are having a medical emergency, call 911.   ADDITIONAL HEALTHCARE OPTIONS FOR PATIENTS  [facility] / e-Visit: [email or web address]         [facility]: [phone]  [facility]: [phone]                   [facility]: [phone]   

## 2018-09-30 NOTE — Progress Notes (Signed)
Radiation Oncology         [phone] ________________________________  Name: Debra Hodges MRN: [id number]  Date: 09/30/2018  DOB: 01-03-1951  Reevaluation Visit Note  CC: System, Pcp Not In  Curt Bears, MD    ICD-10-CM   1. 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   Dr. Tammi Klippel treated her twice preiously; in 2018 for radiation of her LUL lesion, and again in 2019 for radiation to her two liver metastases. She was recently admitted to [facility] on 09/20/18 for a syncopic episode (discharged 4/25). She had a brian MRI performed at [facility] during her stay which revealed widespread leptomeningeal spread of tumor, throughout the cerebral and cerebellar hemispheres. Some of the tumor deposits may be intraparenchymal. No midline shift. Atrophy and small vessel disease. Also during her hospital admission she underwent a CT scan of her head and cervical spine without contrast. This revealed no acute abnormalities. She is currently undergoing chemotherapy with Dr. Julien Nordmann: maintenance treatment with Avastin 15 mg/KG and Tecentriq 1200 mg IV every 3 weeks. First cycle January4, 2020.Status post3cycles.  Since they were last seen in the office, she had a CT with contrast of the chest/abdomen/pelvis on 09/09/18 which showed no significant change in soft tissue about the left hilum and spiculated masses/post treatment change of the suprahilar left lung. Unchanged small nodules of the left upper lobe, nonspecific, for example a 3 mm nodule (series 4, image 37).  No significant change in multiple low-attenuation metastatic lesions of the liver, an index lesion of the left lobe measuring 1.7 cm, unchanged prior (series 2, image 4). These lesions are  however significantly decreased in size in comparison to multiple prior examinations. No evidence of new metastatic disease in the chest, abdomen, or pelvis.                 On review of systems, she reports occasional headaches, blurry vision, and dizziness. she denies any other symptoms. Pertinent positives are listed and detailed within the above HPI.  Previous radiation treatment dates; site/dose:   11/19/2017-12/03/2017; The primary LUL lesion and involved mediastinal adenopathy were treated to 30 Gy in 10 fractions of 3 Gy.  02/21/2017 - 03/02/2017; The liver was treated to 50 Gy in 5 fractions of 10 Gy.                 ALLERGIES:  is allergic to peanut-containing drug products and penicillins.  Meds: Current Outpatient Medications  Medication Sig Dispense Refill   acetaminophen (TYLENOL) 325 MG tablet Take 650 mg by mouth every 6 (six) hours as needed for mild pain. Reported on 06/15/2015     apixaban (ELIQUIS) 5 MG TABS tablet Take 1 tablet (5 mg total) by mouth 2 (two) times daily. 60 tablet 2   calcium carbonate (OS-CAL) 600 MG TABS tablet Take 600 mg by mouth daily.      dexamethasone (DECADRON) 4 MG tablet Take by mouth.     famotidine (PEPCID) 20 MG tablet Take by mouth.     gabapentin (NEURONTIN) 100 MG capsule TAKE 3 CAPSULES(300 MG) BY MOUTH THREE TIMES DAILY 90 capsule 1   levothyroxine (SYNTHROID, LEVOTHROID) 50 MCG tablet Take 50 mcg by mouth at bedtime.      loperamide (IMODIUM) 2 MG capsule Take 2 mg by  mouth daily as needed for diarrhea or loose stools. Reported on 11/22/2015     Multiple Vitamins-Calcium (ONE-A-DAY WOMENS PO) Take 1 tablet by mouth daily.     potassium chloride SA (K-DUR,KLOR-CON) 20 MEQ tablet Take 1 tablet (20 mEq total) by mouth at bedtime. 10 tablet 0   prochlorperazine (COMPAZINE) 10 MG tablet TAKE 1 TABLET(10 MG) BY MOUTH EVERY 6 HOURS AS NEEDED FOR NAUSEA OR VOMITING (Patient taking differently: Take 10 mg by mouth every 6 (six) hours as  needed for nausea or vomiting. ) 385 tablet 3   Sodium Fluoride (CLINPRO 5000) 1.1 % PSTE Place 1 application onto teeth 2 (two) times daily.      spironolactone (ALDACTONE) 25 MG tablet Take 1 tablet (25 mg total) by mouth daily. 30 tablet 0   vitamin C (ASCORBIC ACID) 500 MG tablet Take 500 mg by mouth daily.     CLIMARA 0.1 MG/24HR patch Place 1 patch onto the skin once a week. On Thursday     loratadine (CLARITIN) 10 MG tablet Take 10 mg by mouth daily.     oxyCODONE-acetaminophen (PERCOCET/ROXICET) 5-325 MG tablet Take 1 tablet by mouth every 6 (six) hours as needed for severe pain. (Patient not taking: Reported on 09/30/2018) 60 tablet 0   No current facility-administered medications for this encounter.     Physical Findings: The patient is in no acute distress. Patient is alert and oriented.  oral temperature is 98.1 F (36.7 C). Her blood pressure is 154/82 (abnormal) and her pulse is 79. Her respiration is 20 and oxygen saturation is 99%. .  No significant changes. Lungs are clear to auscultation bilaterally. Heart has regular rate and rhythm. No palpable cervical, supraclavicular, or axillary adenopathy. Abdomen soft, non-tender, normal bowel sounds. Pupils are equal, ocular and reactive to light. Extraocular movements are intact. Tongue is midline. No secondary infection in the oral cavity. Motor strength is 5/5 in the proximal and distal muscle groups. Patient is sitting in a wheelchair during evaluation. She has some dizziness and instability when walking.   Lab Findings: Lab Results  Component Value Date   WBC 5.4 09/11/2018   HGB 11.9 (L) 09/11/2018   HCT 36.0 09/11/2018   MCV 93.3 09/11/2018   PLT 129 (L) 09/11/2018    Radiographic Findings: Ct Chest W Contrast  Result Date: 09/10/2018 CLINICAL DATA:  Restaging metastatic lung cancer EXAM: CT CHEST, ABDOMEN, AND PELVIS WITH CONTRAST TECHNIQUE: Multidetector CT imaging of the chest, abdomen and pelvis was performed  following the standard protocol during bolus administration of intravenous contrast. CONTRAST:  167m OMNIPAQUE IOHEXOL 300 MG/ML SOLN, additional oral enteric contrast COMPARISON:  07/08/2018, 04/29/2018, 02/08/2018 FINDINGS: CT CHEST FINDINGS Cardiovascular: No significant vascular findings. Normal heart size. No pericardial effusion. Mediastinum/Nodes: No enlarged mediastinal, hilar, or axillary lymph nodes. Unchanged soft tissue about the left hilum. Calcified nodule of the right lobe of thyroid. Trachea, and esophagus demonstrate no significant findings. Lungs/Pleura: No significant change in soft tissue about the left hilum and spiculated masses/post treatment change of the suprahilar left lung. Unchanged small nodules of the left upper lobe, nonspecific, for example a 3 mm nodule (series 4, image 37). No pleural effusion or pneumothorax. Musculoskeletal: No chest wall mass or suspicious bone lesions identified. CT ABDOMEN PELVIS FINDINGS Hepatobiliary: No significant change in multiple low-attenuation lesions of the liver, an index lesion of the left lobe measuring 1.7 cm, unchanged prior (series 2, image 4). No gallstones, gallbladder wall thickening, or biliary dilatation. Pancreas: Unremarkable. No pancreatic  ductal dilatation or surrounding inflammatory changes. Spleen: Normal in size without focal abnormality. Adrenals/Urinary Tract: Adrenal glands are unremarkable. Nonobstructive left renal calculus. Bladder is unremarkable. Stomach/Bowel: Stomach is within normal limits. No evidence of bowel wall thickening, distention, or inflammatory changes. Vascular/Lymphatic: Calcific atherosclerosis the abdominal aorta. No enlarged abdominal or pelvic lymph nodes. Reproductive: No mass or other abnormality. Status post hysterectomy. Other: No abdominal wall hernia or abnormality. No abdominopelvic ascites. Musculoskeletal: No acute or significant osseous findings. IMPRESSION: 1. No significant change in soft  tissue about the left hilum and spiculated masses/post treatment change of the suprahilar left lung. Unchanged small nodules of the left upper lobe, nonspecific, for example a 3 mm nodule (series 4, image 37). 2. No significant change in multiple low-attenuation metastatic lesions of the liver, an index lesion of the left lobe measuring 1.7 cm, unchanged prior (series 2, image 4). These lesions are however significantly decreased in size in comparison to multiple prior examinations. 3. No evidence of new metastatic disease in the chest, abdomen, or pelvis. Electronically Signed   By: Eddie Candle M.D.   On: 09/10/2018 09:10   Ct Abdomen Pelvis W Contrast  Result Date: 09/10/2018 CLINICAL DATA:  Restaging metastatic lung cancer EXAM: CT CHEST, ABDOMEN, AND PELVIS WITH CONTRAST TECHNIQUE: Multidetector CT imaging of the chest, abdomen and pelvis was performed following the standard protocol during bolus administration of intravenous contrast. CONTRAST:  174m OMNIPAQUE IOHEXOL 300 MG/ML SOLN, additional oral enteric contrast COMPARISON:  07/08/2018, 04/29/2018, 02/08/2018 FINDINGS: CT CHEST FINDINGS Cardiovascular: No significant vascular findings. Normal heart size. No pericardial effusion. Mediastinum/Nodes: No enlarged mediastinal, hilar, or axillary lymph nodes. Unchanged soft tissue about the left hilum. Calcified nodule of the right lobe of thyroid. Trachea, and esophagus demonstrate no significant findings. Lungs/Pleura: No significant change in soft tissue about the left hilum and spiculated masses/post treatment change of the suprahilar left lung. Unchanged small nodules of the left upper lobe, nonspecific, for example a 3 mm nodule (series 4, image 37). No pleural effusion or pneumothorax. Musculoskeletal: No chest wall mass or suspicious bone lesions identified. CT ABDOMEN PELVIS FINDINGS Hepatobiliary: No significant change in multiple low-attenuation lesions of the liver, an index lesion of the left  lobe measuring 1.7 cm, unchanged prior (series 2, image 4). No gallstones, gallbladder wall thickening, or biliary dilatation. Pancreas: Unremarkable. No pancreatic ductal dilatation or surrounding inflammatory changes. Spleen: Normal in size without focal abnormality. Adrenals/Urinary Tract: Adrenal glands are unremarkable. Nonobstructive left renal calculus. Bladder is unremarkable. Stomach/Bowel: Stomach is within normal limits. No evidence of bowel wall thickening, distention, or inflammatory changes. Vascular/Lymphatic: Calcific atherosclerosis the abdominal aorta. No enlarged abdominal or pelvic lymph nodes. Reproductive: No mass or other abnormality. Status post hysterectomy. Other: No abdominal wall hernia or abnormality. No abdominopelvic ascites. Musculoskeletal: No acute or significant osseous findings. IMPRESSION: 1. No significant change in soft tissue about the left hilum and spiculated masses/post treatment change of the suprahilar left lung. Unchanged small nodules of the left upper lobe, nonspecific, for example a 3 mm nodule (series 4, image 37). 2. No significant change in multiple low-attenuation metastatic lesions of the liver, an index lesion of the left lobe measuring 1.7 cm, unchanged prior (series 2, image 4). These lesions are however significantly decreased in size in comparison to multiple prior examinations. 3. No evidence of new metastatic disease in the chest, abdomen, or pelvis. Electronically Signed   By: AEddie CandleM.D.   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 Pt would be a good candidate for palliative radiation therapy directed at the brain/intracranial meninges. The pt has already been started on Decadron, with some improvement in her symptoms.   Today, I talked to the patient  about the findings and work-up thus far.   We discussed the natural history of her condition and general treatment, highlighting the role of radiotherapy in the management.  We discussed the available radiation techniques, and focused on the details of logistics and delivery.  We reviewed the anticipated acute and late sequelae associated with radiation in this setting.  The patient was encouraged to ask questions that I answered to the best of my ability.  A patient consent form was discussed and signed.  We retained a copy for our records.  The patient would like to proceed with radiation and will be scheduled for CT simulation.   Plan:  CT simulation May 5th at 9:30 AM. Anticipate between 10-14 treatments directed at the brain/leptomeninges.   ____________________________________   Blair Promise, PhD, MD    This document serves as a record of services personally performed by Gery Pray, MD. It was created on his behalf by Mary-Margaret Loma Messing, a trained medical scribe. The creation of this record is based on the scribe's personal observations and the provider's statements to them. This document has been checked and approved by the attending provider.

## 2018-10-01 ENCOUNTER — Ambulatory Visit
Admission: RE | Admit: 2018-10-01 | Discharge: 2018-10-01 | Disposition: A | Discharge status: Home / Self Care | Payer: Medicare Other | Source: Ambulatory Visit | Attending: Radiation Oncology | Admitting: Radiation Oncology

## 2018-10-01 ENCOUNTER — Other Ambulatory Visit: Payer: Self-pay

## 2018-10-01 DIAGNOSIS — C7931 Secondary malignant neoplasm of brain: Secondary | ICD-10-CM | POA: Insufficient documentation

## 2018-10-01 DIAGNOSIS — C7949 Secondary malignant neoplasm of other parts of nervous system: Secondary | ICD-10-CM | POA: Diagnosis not present

## 2018-10-01 DIAGNOSIS — Z510 Encounter for antineoplastic radiation therapy: Secondary | ICD-10-CM | POA: Diagnosis not present

## 2018-10-01 DIAGNOSIS — C3412 Malignant neoplasm of upper lobe, left bronchus or lung: Secondary | ICD-10-CM | POA: Insufficient documentation

## 2018-10-01 NOTE — Progress Notes (Signed)
  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  Identity was confirmed.  All relevant records and images related to the planned course of therapy were reviewed.  The patient freely provided informed written consent to proceed with treatment after reviewing the details related to the planned course of therapy. The consent form was witnessed and verified by the simulation staff.  Then, the patient was set-up in a stable reproducible  supine position for radiation therapy.  CT images were obtained.  Surface markings were placed.  The CT images were loaded into the planning software.  Then the target and avoidance structures were contoured.  Treatment planning then occurred.  The radiation prescription was entered and confirmed.  Then, I designed and supervised the construction of a total of 5 medically necessary complex treatment devices.  I have requested : Isodose Plan.  I have ordered:CBC  PLAN:  The patient will receive 30 Gy in 10 fractions directed at the brain/leptomeninges.  -----------------------------------  Blair Promise, PhD, MD

## 2018-10-02 ENCOUNTER — Other Ambulatory Visit: Payer: Self-pay

## 2018-10-02 ENCOUNTER — Telehealth: Payer: Self-pay | Admitting: Internal Medicine

## 2018-10-02 ENCOUNTER — Inpatient Hospital Stay: Payer: Medicare Other | Attending: Internal Medicine | Admitting: Internal Medicine

## 2018-10-02 ENCOUNTER — Telehealth: Payer: Self-pay

## 2018-10-02 ENCOUNTER — Inpatient Hospital Stay: Payer: Medicare Other

## 2018-10-02 ENCOUNTER — Encounter: Payer: Self-pay | Admitting: Internal Medicine

## 2018-10-02 ENCOUNTER — Telehealth: Payer: Self-pay | Admitting: Pharmacist

## 2018-10-02 VITALS — BP 162/84 | HR 106 | Temp 98.1°F | Resp 20 | Ht 66.0 in | Wt 184.7 lb

## 2018-10-02 DIAGNOSIS — C3412 Malignant neoplasm of upper lobe, left bronchus or lung: Secondary | ICD-10-CM

## 2018-10-02 DIAGNOSIS — I10 Essential (primary) hypertension: Secondary | ICD-10-CM | POA: Diagnosis not present

## 2018-10-02 DIAGNOSIS — C778 Secondary and unspecified malignant neoplasm of lymph nodes of multiple regions: Secondary | ICD-10-CM | POA: Insufficient documentation

## 2018-10-02 DIAGNOSIS — D696 Thrombocytopenia, unspecified: Secondary | ICD-10-CM | POA: Insufficient documentation

## 2018-10-02 DIAGNOSIS — Z510 Encounter for antineoplastic radiation therapy: Secondary | ICD-10-CM | POA: Diagnosis not present

## 2018-10-02 DIAGNOSIS — C3492 Malignant neoplasm of unspecified part of left bronchus or lung: Secondary | ICD-10-CM

## 2018-10-02 DIAGNOSIS — Z9221 Personal history of antineoplastic chemotherapy: Secondary | ICD-10-CM

## 2018-10-02 DIAGNOSIS — Z79899 Other long term (current) drug therapy: Secondary | ICD-10-CM | POA: Diagnosis not present

## 2018-10-02 DIAGNOSIS — R5383 Other fatigue: Secondary | ICD-10-CM | POA: Diagnosis not present

## 2018-10-02 DIAGNOSIS — Z5111 Encounter for antineoplastic chemotherapy: Secondary | ICD-10-CM

## 2018-10-02 DIAGNOSIS — N200 Calculus of kidney: Secondary | ICD-10-CM | POA: Diagnosis not present

## 2018-10-02 DIAGNOSIS — Z7901 Long term (current) use of anticoagulants: Secondary | ICD-10-CM | POA: Insufficient documentation

## 2018-10-02 DIAGNOSIS — E039 Hypothyroidism, unspecified: Secondary | ICD-10-CM

## 2018-10-02 DIAGNOSIS — C787 Secondary malignant neoplasm of liver and intrahepatic bile duct: Secondary | ICD-10-CM | POA: Insufficient documentation

## 2018-10-02 DIAGNOSIS — R200 Anesthesia of skin: Secondary | ICD-10-CM

## 2018-10-02 DIAGNOSIS — I2699 Other pulmonary embolism without acute cor pulmonale: Secondary | ICD-10-CM

## 2018-10-02 DIAGNOSIS — C7949 Secondary malignant neoplasm of other parts of nervous system: Secondary | ICD-10-CM | POA: Insufficient documentation

## 2018-10-02 DIAGNOSIS — Z7189 Other specified counseling: Secondary | ICD-10-CM

## 2018-10-02 DIAGNOSIS — C7931 Secondary malignant neoplasm of brain: Secondary | ICD-10-CM | POA: Diagnosis not present

## 2018-10-02 LAB — CMP (CANCER CENTER ONLY)
ALT: 80 U/L — ABNORMAL HIGH (ref 0–44)
AST: 42 U/L — ABNORMAL HIGH (ref 15–41)
Albumin: 3 g/dL — ABNORMAL LOW (ref 3.5–5.0)
Alkaline Phosphatase: 131 U/L — ABNORMAL HIGH (ref 38–126)
Anion gap: 10 (ref 5–15)
BUN: 31 mg/dL — ABNORMAL HIGH (ref 8–23)
CO2: 20 mmol/L — ABNORMAL LOW (ref 22–32)
Calcium: 7.9 mg/dL — ABNORMAL LOW (ref 8.9–10.3)
Chloride: 107 mmol/L (ref 98–111)
Creatinine: 0.68 mg/dL (ref 0.44–1.00)
GFR, Est AFR Am: >60 mL/min (ref >60)
GFR, Estimated: >60 mL/min (ref >60)
Glucose, Bld: 146 mg/dL — ABNORMAL HIGH (ref 70–99)
Potassium: 4.3 mmol/L (ref 3.5–5.1)
Sodium: 137 mmol/L (ref 135–145)
Total Bilirubin: 1 mg/dL (ref 0.3–1.2)
Total Protein: 6.6 g/dL (ref 6.5–8.1)

## 2018-10-02 LAB — CBC WITH DIFFERENTIAL (CANCER CENTER ONLY)
Abs Immature Granulocytes: 0.12 10*3/uL — ABNORMAL HIGH (ref 0.00–0.07)
Basophils Absolute: 0 10*3/uL (ref 0.0–0.1)
Basophils Relative: 0 %
Eosinophils Absolute: 0 10*3/uL (ref 0.0–0.5)
Eosinophils Relative: 0 %
HCT: 42.9 % (ref 36.0–46.0)
Hemoglobin: 14.6 g/dL (ref 12.0–15.0)
Immature Granulocytes: 1 %
Lymphocytes Relative: 6 %
Lymphs Abs: 1.1 10*3/uL (ref 0.7–4.0)
MCH: 31.1 pg (ref 26.0–34.0)
MCHC: 34 g/dL (ref 30.0–36.0)
MCV: 91.5 fL (ref 80.0–100.0)
Monocytes Absolute: 1.4 10*3/uL — ABNORMAL HIGH (ref 0.1–1.0)
Monocytes Relative: 9 %
Neutro Abs: 13.9 10*3/uL — ABNORMAL HIGH (ref 1.7–7.7)
Neutrophils Relative %: 84 %
Platelet Count: 196 10*3/uL (ref 150–400)
RBC: 4.69 MIL/uL (ref 3.87–5.11)
RDW: 15.3 % (ref 11.5–15.5)
WBC Count: 16.5 10*3/uL — ABNORMAL HIGH (ref 4.0–10.5)
nRBC: 0 % (ref 0.0–0.2)

## 2018-10-02 LAB — TSH: TSH: <0.08 u[IU]/mL — ABNORMAL LOW (ref 0.308–3.960)

## 2018-10-02 LAB — TOTAL PROTEIN, URINE DIPSTICK: Protein, ur: 30 mg/dL — AB

## 2018-10-02 MED ORDER — OSIMERTINIB MESYLATE 80 MG PO TABS: 160.0000 mg | ORAL_TABLET | Freq: Every day | ORAL | 2 refills | Status: DC

## 2018-10-02 NOTE — Telephone Encounter (Signed)
Oral Oncology Patient Advocate Encounter  Debra Hodges copay is [phone], this is not affordable to the patient.   There are no grants open right now but I am able to help the patient apply for manufacturer assistance through [employer].   I called the patient and explained this to her. She agreed to proceed with the application process. I will fill the application out for her. [employer] is not requiring patient signatures or income documents during the COVID-19 pandemic.  I will complete the application and fax to [employer].  This encounter will be updated until final determination.  [place] Patient [name] Phone [phone] Fax [phone] 10/02/2018   3:44 PM

## 2018-10-02 NOTE — Telephone Encounter (Signed)
Oral Oncology Patient Advocate Encounter  Prior Authorization for [employer] has been approved.    PA# [id number] Effective dates: 05/29/18 through 05/29/19  Oral Oncology Clinic will continue to follow.   [place] Patient [place] Phone [phone] Fax [phone] 10/02/2018    2:25 PM

## 2018-10-02 NOTE — Telephone Encounter (Signed)
Scheduled appt per 5/6 los - pt aware of appt date and time

## 2018-10-02 NOTE — Telephone Encounter (Signed)
Oral Oncology Patient Advocate Encounter  Received notification from [facility] that prior authorization for Tagrisso is required.  PA submitted on CoverMyMeds Key [id number] Status is pending  Oral Oncology Clinic will continue to follow.  [place] Patient [place] Phone [phone] Fax [phone] 10/02/2018    1:25 PM

## 2018-10-02 NOTE — Progress Notes (Signed)
[place] [phone]   [phone]) [phone]  OFFICE PROGRESS NOTE  System, Pcp Not In No address on file  DIAGNOSIS: Stage IV (T1b, N3, M1b) non-small cell lung cancer, adenocarcinoma with positive EGFR mutation in exon 21 (L858R) and recently found to have T790M resistant mutation, presented with left upper lobe lung mass in addition to left hilar and bilateral mediastinal lymphadenopathy as well as liver metastasis diagnosed in May of 2015.  PRIOR THERAPY: 1) Tarceva 150 mg by mouth daily. Started 11/25/2013. Status post 5 months of treatment discontinued today secondary to intolerance with persistent paronychia. 2) Tarceva 100 mg by mouth daily. Started 06/03/2014. Status post 8 months of treatment.  3) stereotactic radiotherapy to liver lesions under the care of Dr. Tammi Klippel. Last dose 03/02/2017 4) Tagrisso 80 mg by mouth daily status post 34 months of treatment. First dose was given 03/04/2015.  This was discontinued secondary to disease progression. 5) Systemic chemotherapy with carboplatin for AUC of 6, paclitaxel 200 mg/M2, Avastin 15 mg/KG and Tecentriq 1200 mg IV every 3 weeks with Neulasta support.  First dose 02/21/2018.  Status post 4 cycles.  Starting from cycle #4 the dose of carboplatin will be for AUC of 5 and paclitaxel 150 mg/M2. 6) Maintenance treatment with Avastin 15 mg/KG and Tecentriq 1200 mg IV every 3 weeks.  First cycle May 30, 2018.  Status post 4 cycles.  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 7) whole brain irradiation under the care of Dr. Sondra Come   CURRENT THERAPY: Tagrisso 160 mg p.o. daily for leptomeningeal disease and patient with EGFR mutation according to the Bloom study.  This is expected to start in the next few days.  INTERVAL HISTORY: Debra Hodges 68 y.o. female returns to the clinic today for follow-up visit.  The patient had an episode of unconsciousness with  significant fatigue and weakness 10 days ago.  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  Some of the tumor deposits may be intraparenchymal with no midline shift.  The patient was started on Decadron 4 mg p.o. twice daily.  I referred her to Dr. Sondra Come and she is expected to start brain radiation soon.  She denied having any current chest pain but has shortness of breath with exertion with no cough or hemoptysis.  She denied having any fever or chills.  She has no nausea, vomiting, diarrhea or constipation.  She is here today for evaluation and recommendation regarding her condition.  MEDICAL HISTORY: Past Medical History:  Diagnosis Date   High blood pressure    Liver lesion 06/26/2016   Non-small cell carcinoma of lung, stage 4 ([place]) dx'd 10/2013    ALLERGIES:  is allergic to peanut-containing drug products and penicillins.  MEDICATIONS:  Current Outpatient Medications  Medication Sig Dispense Refill   acetaminophen (TYLENOL) 325 MG tablet Take 650 mg by mouth every 6 (six) hours as needed for mild pain. Reported on 06/15/2015     apixaban (ELIQUIS) 5 MG TABS tablet Take 1 tablet (5 mg total) by mouth 2 (two) times daily. 60 tablet 2   calcium carbonate (OS-CAL) 600 MG TABS tablet Take 600 mg by mouth daily.      CLIMARA 0.1 MG/24HR patch Place 1 patch onto the skin once a week. On Thursday  dexamethasone (DECADRON) 4 MG tablet Take by mouth.     famotidine (PEPCID) 20 MG tablet Take by mouth.     gabapentin (NEURONTIN) 100 MG capsule TAKE 3 CAPSULES(300 MG) BY MOUTH THREE TIMES DAILY 90 capsule 1   levothyroxine (SYNTHROID, LEVOTHROID) 50 MCG tablet Take 50 mcg by mouth at bedtime.      loperamide (IMODIUM) 2 MG capsule Take 2 mg by mouth daily as needed for diarrhea or loose stools. Reported on 11/22/2015     loratadine  (CLARITIN) 10 MG tablet Take 10 mg by mouth daily.     Multiple Vitamins-Calcium (ONE-A-DAY WOMENS PO) Take 1 tablet by mouth daily.     oxyCODONE-acetaminophen (PERCOCET/ROXICET) 5-325 MG tablet Take 1 tablet by mouth every 6 (six) hours as needed for severe pain. (Patient not taking: Reported on 09/30/2018) 60 tablet 0   potassium chloride SA (K-DUR,KLOR-CON) 20 MEQ tablet Take 1 tablet (20 mEq total) by mouth at bedtime. 10 tablet 0   prochlorperazine (COMPAZINE) 10 MG tablet TAKE 1 TABLET(10 MG) BY MOUTH EVERY 6 HOURS AS NEEDED FOR NAUSEA OR VOMITING (Patient taking differently: Take 10 mg by mouth every 6 (six) hours as needed for nausea or vomiting. ) 385 tablet 3   Sodium Fluoride (CLINPRO 5000) 1.1 % PSTE Place 1 application onto teeth 2 (two) times daily.      spironolactone (ALDACTONE) 25 MG tablet Take 1 tablet (25 mg total) by mouth daily. 30 tablet 0   vitamin C (ASCORBIC ACID) 500 MG tablet Take 500 mg by mouth daily.     No current facility-administered medications for this visit.     SURGICAL HISTORY:  Past Surgical History:  Procedure Laterality Date   ABDOMINAL HYSTERECTOMY     BREAST BIOPSY     Right breast   VIDEO BRONCHOSCOPY Bilateral 10/27/2013   Procedure: VIDEO BRONCHOSCOPY WITH FLUORO;  Surgeon: Collene Gobble, MD;  Location: [facility];  Service: Cardiopulmonary;  Laterality: Bilateral;    REVIEW OF SYSTEMS:  Constitutional: positive for fatigue Eyes: negative Ears, nose, mouth, throat, and face: negative Respiratory: positive for dyspnea on exertion Cardiovascular: negative Gastrointestinal: negative Genitourinary:negative Integument/breast: negative Hematologic/lymphatic: negative Musculoskeletal:positive for muscle weakness Neurological: positive for coordination problems Behavioral/Psych: negative Endocrine: negative Allergic/Immunologic: negative   PHYSICAL EXAMINATION: General appearance: alert, cooperative, fatigued and no distress Head:  Normocephalic, without obvious abnormality, atraumatic Neck: no adenopathy, no JVD, supple, symmetrical, trachea midline and thyroid not enlarged, symmetric, no tenderness/mass/nodules Lymph nodes: Cervical, supraclavicular, and axillary nodes normal. Resp: clear to auscultation bilaterally Back: symmetric, no curvature. ROM normal. No CVA tenderness. Cardio: regular rate and rhythm, S1, S2 normal, no murmur, click, rub or gallop GI: soft, non-tender; bowel sounds normal; no masses,  no organomegaly Extremities: extremities normal, atraumatic, no cyanosis or edema Neurologic: Alert and oriented X 3, normal strength and tone. Normal symmetric reflexes. Normal coordination and gait  ECOG PERFORMANCE STATUS: 1 - Symptomatic but completely ambulatory  Blood pressure (!) 162/84, pulse (!) 106, temperature 98.1 F (36.7 C), temperature source Oral, resp. rate 20, height '5\' 6"'  (1.676 m), weight 184 lb 11.2 oz (83.8 kg), SpO2 97 %.  LABORATORY DATA: Lab Results  Component Value Date   WBC 16.5 (H) 10/02/2018   HGB 14.6 10/02/2018   HCT 42.9 10/02/2018   MCV 91.5 10/02/2018   PLT 196 10/02/2018      Chemistry      Component Value Date/Time   NA 139 09/11/2018 0751   NA 141 04/30/2017 1541  K 3.3 (L) 09/11/2018 0751   K 4.1 04/30/2017 1541   CL 108 09/11/2018 0751   CO2 20 (L) 09/11/2018 0751   CO2 26 04/30/2017 1541   BUN 10 09/11/2018 0751   BUN 14.1 04/30/2017 1541   CREATININE 0.59 09/11/2018 0751   CREATININE 0.8 04/30/2017 1541      Component Value Date/Time   CALCIUM 8.3 (L) 09/11/2018 0751   CALCIUM 9.6 04/30/2017 1541   ALKPHOS 124 09/11/2018 0751   ALKPHOS 105 04/30/2017 1541   AST 35 09/11/2018 0751   AST 40 (H) 04/30/2017 1541   ALT 22 09/11/2018 0751   ALT 27 04/30/2017 1541   BILITOT 0.7 09/11/2018 0751   BILITOT 0.55 04/30/2017 1541       RADIOGRAPHIC STUDIES: Ct Chest W Contrast  Result Date: 09/10/2018 CLINICAL DATA:  Restaging metastatic lung cancer  EXAM: CT CHEST, ABDOMEN, AND PELVIS WITH CONTRAST TECHNIQUE: Multidetector CT imaging of the chest, abdomen and pelvis was performed following the standard protocol during bolus administration of intravenous contrast. CONTRAST:  151m OMNIPAQUE IOHEXOL 300 MG/ML SOLN, additional oral enteric contrast COMPARISON:  07/08/2018, 04/29/2018, 02/08/2018 FINDINGS: CT CHEST FINDINGS Cardiovascular: No significant vascular findings. Normal heart size. No pericardial effusion. Mediastinum/Nodes: No enlarged mediastinal, hilar, or axillary lymph nodes. Unchanged soft tissue about the left hilum. Calcified nodule of the right lobe of thyroid. Trachea, and esophagus demonstrate no significant findings. Lungs/Pleura: No significant change in soft tissue about the left hilum and spiculated masses/post treatment change of the suprahilar left lung. Unchanged small nodules of the left upper lobe, nonspecific, for example a 3 mm nodule (series 4, image 37). No pleural effusion or pneumothorax. Musculoskeletal: No chest wall mass or suspicious bone lesions identified. CT ABDOMEN PELVIS FINDINGS Hepatobiliary: No significant change in multiple low-attenuation lesions of the liver, an index lesion of the left lobe measuring 1.7 cm, unchanged prior (series 2, image 4). No gallstones, gallbladder wall thickening, or biliary dilatation. Pancreas: Unremarkable. No pancreatic ductal dilatation or surrounding inflammatory changes. Spleen: Normal in size without focal abnormality. Adrenals/Urinary Tract: Adrenal glands are unremarkable. Nonobstructive left renal calculus. Bladder is unremarkable. Stomach/Bowel: Stomach is within normal limits. No evidence of bowel wall thickening, distention, or inflammatory changes. Vascular/Lymphatic: Calcific atherosclerosis the abdominal aorta. No enlarged abdominal or pelvic lymph nodes. Reproductive: No mass or other abnormality. Status post hysterectomy. Other: No abdominal wall hernia or abnormality. No  abdominopelvic ascites. Musculoskeletal: No acute or significant osseous findings. IMPRESSION: 1. No significant change in soft tissue about the left hilum and spiculated masses/post treatment change of the suprahilar left lung. Unchanged small nodules of the left upper lobe, nonspecific, for example a 3 mm nodule (series 4, image 37). 2. No significant change in multiple low-attenuation metastatic lesions of the liver, an index lesion of the left lobe measuring 1.7 cm, unchanged prior (series 2, image 4). These lesions are however significantly decreased in size in comparison to multiple prior examinations. 3. No evidence of new metastatic disease in the chest, abdomen, or pelvis. Electronically Signed   By: AEddie CandleM.D.   On: 09/10/2018 09:10   Ct Abdomen Pelvis W Contrast  Result Date: 09/10/2018 CLINICAL DATA:  Restaging metastatic lung cancer EXAM: CT CHEST, ABDOMEN, AND PELVIS WITH CONTRAST TECHNIQUE: Multidetector CT imaging of the chest, abdomen and pelvis was performed following the standard protocol during bolus administration of intravenous contrast. CONTRAST:  1060mOMNIPAQUE IOHEXOL 300 MG/ML SOLN, additional oral enteric contrast COMPARISON:  07/08/2018, 04/29/2018, 02/08/2018 FINDINGS: CT CHEST FINDINGS  Cardiovascular: No significant vascular findings. Normal heart size. No pericardial effusion. Mediastinum/Nodes: No enlarged mediastinal, hilar, or axillary lymph nodes. Unchanged soft tissue about the left hilum. Calcified nodule of the right lobe of thyroid. Trachea, and esophagus demonstrate no significant findings. Lungs/Pleura: No significant change in soft tissue about the left hilum and spiculated masses/post treatment change of the suprahilar left lung. Unchanged small nodules of the left upper lobe, nonspecific, for example a 3 mm nodule (series 4, image 37). No pleural effusion or pneumothorax. Musculoskeletal: No chest wall mass or suspicious bone lesions identified. CT ABDOMEN  PELVIS FINDINGS Hepatobiliary: No significant change in multiple low-attenuation lesions of the liver, an index lesion of the left lobe measuring 1.7 cm, unchanged prior (series 2, image 4). No gallstones, gallbladder wall thickening, or biliary dilatation. Pancreas: Unremarkable. No pancreatic ductal dilatation or surrounding inflammatory changes. Spleen: Normal in size without focal abnormality. Adrenals/Urinary Tract: Adrenal glands are unremarkable. Nonobstructive left renal calculus. Bladder is unremarkable. Stomach/Bowel: Stomach is within normal limits. No evidence of bowel wall thickening, distention, or inflammatory changes. Vascular/Lymphatic: Calcific atherosclerosis the abdominal aorta. No enlarged abdominal or pelvic lymph nodes. Reproductive: No mass or other abnormality. Status post hysterectomy. Other: No abdominal wall hernia or abnormality. No abdominopelvic ascites. Musculoskeletal: No acute or significant osseous findings. IMPRESSION: 1. No significant change in soft tissue about the left hilum and spiculated masses/post treatment change of the suprahilar left lung. Unchanged small nodules of the left upper lobe, nonspecific, for example a 3 mm nodule (series 4, image 37). 2. No significant change in multiple low-attenuation metastatic lesions of the liver, an index lesion of the left lobe measuring 1.7 cm, unchanged prior (series 2, image 4). These lesions are however significantly decreased in size in comparison to multiple prior examinations. 3. No evidence of new metastatic disease in the chest, abdomen, or pelvis. Electronically Signed   By: Eddie Candle M.D.   On: 09/10/2018 09:10    ASSESSMENT AND PLAN:  This is a very pleasant 68 years old white female with a stage IV non-small cell lung cancer, adenocarcinoma with positive EGFR mutation in exon 21 (L858R) diagnosed in May 2015 status post treatment with Tarceva for 13 months discontinued secondary to disease progression and  development of T790M resistant mutation. The patient is currently undergoing treatment with Tagrisso 80 mg by mouth daily status post 34 months. I also referred the patient to Dr. Tammi Klippel from radiation oncology for consideration of palliative radiotherapy to the enlarging left upper lobe suprahilar mass to avoid further obstruction of her bronchial tree. Repeat CT scan of the chest, abdomen and pelvis showed findings for disease progression especially in the liver. The patient discontinued her treatment with Tagrisso and she was started on systemic chemotherapy with carboplatin for AUC of 6, paclitaxel 200 mg/M2, Avastin 15 mg/KG as well as Tecentriq (Atezolizumab) 1200 mg IV every 3 weeks status post 4 cycles.   Starting from cycle #5 the patient will be on maintenance treatment with a Avastin 15 mg/KG as well as Tecentriq 1200 mg IV every 3 weeks.  First dose will be on May 30, 2018.  She is status post 6 cycles of the maintenance phase of her treatment. 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  She is currently on Decadron 4 mg p.o. twice daily.  She is expected to start whole brain irradiation soon. I had a lengthy discussion with the patient today  about her current condition and treatment options. I recommended for the patient to discontinue her current treatment with the maintenance Avastin and Tecentriq. I discussed with the patient the option of treatment with Tagrisso 160 mg p.o. daily according to the [place] study for patient with metastatic non-small cell lung cancer with positive EGFR mutation and leptomeningeal disease.  The patient is interested in this treatment.  I will send her prescription to [facility] long outpatient pharmacy for refill once we receive her insurance approval. I will see the patient back for follow-up visit in 2 weeks for reevaluation and management of any adverse effect of her  treatment. For the peripheral neuropathy, the patient on Neurontin 100 mg p.o. 3 times daily. For the history of pulmonary embolism, she is currently on Xarelto. She was advised to call immediately if she has any concerning symptoms in the interval. The patient voices understanding of current disease status and treatment options and is in agreement with the current care plan. All questions were answered. The patient knows to call the clinic with any problems, questions or concerns. We can certainly see the patient much sooner if necessary.  Disclaimer: This note was dictated with voice recognition software. Similar sounding words can inadvertently be transcribed and may not be corrected upon review.

## 2018-10-02 NOTE — Telephone Encounter (Addendum)
Oral Oncology Pharmacist Encounter  Received new prescription for Tagrisso (osimertinib) for the treatment of previously treated, metastatic non-small cell lung cancer, EGFR mutation positive (originally exon 21 L858R, then T790M), now with documented leptomeningeal spread, planned duration until disease progression or unacceptable tocicity.  Due to leptomeningeal spread, Tagrisso is planned to be administered at 117m once daily based on the Bloom study.  Labs from 10/02/2018 assessed, OK for treatment initiation. Noted slight elevations in LFTs, no dose adjustment per manufacturer for mild-moderate hepatic impairment 09/22/18 EKG shows QTc at 446 msec, safe for Tagrisso initiation  Current medication list in Epic reviewed, no significant DDIs with Tagrisso identified.  [facility].  Oral Oncology Clinic will continue to follow for insurance authorization, copayment issues, initial counseling and start date.  JJohny Drilling PharmD, BCPS, BCOP  10/02/2018 10:40 AM Oral Oncology Clinic [phone]

## 2018-10-03 ENCOUNTER — Ambulatory Visit
Admission: RE | Admit: 2018-10-03 | Discharge: 2018-10-03 | Disposition: A | Discharge status: Home / Self Care | Payer: Medicare Other | Source: Ambulatory Visit | Attending: Radiation Oncology | Admitting: Radiation Oncology

## 2018-10-03 ENCOUNTER — Other Ambulatory Visit: Payer: Self-pay

## 2018-10-03 DIAGNOSIS — C7949 Secondary malignant neoplasm of other parts of nervous system: Secondary | ICD-10-CM

## 2018-10-03 DIAGNOSIS — Z510 Encounter for antineoplastic radiation therapy: Secondary | ICD-10-CM | POA: Diagnosis not present

## 2018-10-03 DIAGNOSIS — C7931 Secondary malignant neoplasm of brain: Secondary | ICD-10-CM | POA: Diagnosis not present

## 2018-10-03 DIAGNOSIS — C3412 Malignant neoplasm of upper lobe, left bronchus or lung: Secondary | ICD-10-CM | POA: Diagnosis not present

## 2018-10-03 NOTE — Telephone Encounter (Signed)
Oral Oncology Patient Advocate Encounter  I faxed the completed application for Tagrisso today.  [employer] follow up phone number is [phone].  This encounter will be updated until final determination.  [place] Patient [place] Phone [phone] Fax [phone] 10/03/2018   8:31 AM

## 2018-10-03 NOTE — Progress Notes (Signed)
  Radiation Oncology         [phone] ________________________________  Name: Debra Hodges MRN: [id number]  Date: 10/03/2018  DOB: 05-16-1951  Simulation Verification Note    ICD-10-CM   1. 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 The clinical setup was verified. Then port films were obtained and uploaded to the radiation oncology medical record software.  The treatment beams were carefully compared against the planned radiation fields. The position location and shape of the radiation fields was reviewed. They targeted volume of tissue appears to be appropriately covered by the radiation beams. Organs at risk appear to be excluded as planned.  Based on my personal review, I approved the simulation verification. The patient's treatment will proceed as planned.  -----------------------------------  Blair Promise, PhD, MD  This document serves as a record of services personally performed by Gery Pray, MD. It was created on his behalf by Rae Lips, a trained medical scribe. The creation of this record is based on the scribe's personal observations and the provider's statements to them. This document has been checked and approved by the attending provider.

## 2018-10-04 ENCOUNTER — Telehealth: Payer: Self-pay | Admitting: *Deleted

## 2018-10-04 ENCOUNTER — Other Ambulatory Visit: Payer: Self-pay

## 2018-10-04 ENCOUNTER — Other Ambulatory Visit: Payer: Self-pay | Admitting: Radiation Oncology

## 2018-10-04 ENCOUNTER — Ambulatory Visit
Admission: RE | Admit: 2018-10-04 | Discharge: 2018-10-04 | Disposition: A | Discharge status: Home / Self Care | Payer: Medicare Other | Source: Ambulatory Visit | Attending: Radiation Oncology | Admitting: Radiation Oncology

## 2018-10-04 DIAGNOSIS — C7931 Secondary malignant neoplasm of brain: Secondary | ICD-10-CM | POA: Diagnosis not present

## 2018-10-04 DIAGNOSIS — C7949 Secondary malignant neoplasm of other parts of nervous system: Secondary | ICD-10-CM

## 2018-10-04 DIAGNOSIS — C3412 Malignant neoplasm of upper lobe, left bronchus or lung: Secondary | ICD-10-CM | POA: Diagnosis not present

## 2018-10-04 DIAGNOSIS — Z510 Encounter for antineoplastic radiation therapy: Secondary | ICD-10-CM | POA: Diagnosis not present

## 2018-10-04 MED ORDER — OSIMERTINIB MESYLATE 80 MG PO TABS: 160.0000 mg | ORAL_TABLET | Freq: Every day | ORAL | 2 refills | Status: DC

## 2018-10-04 MED ORDER — DEXAMETHASONE 4 MG PO TABS: ORAL_TABLET | ORAL | 0 refills | Status: DC

## 2018-10-04 NOTE — Telephone Encounter (Signed)
Called patient to inform that script has been refilled, spoke with patient and she is aware of this.

## 2018-10-04 NOTE — Telephone Encounter (Signed)
Oral Oncology Patient Advocate Encounter  Received notification from [employer] Patient Assistance program that patient has been successfully enrolled into their program to receive Tagrisso from the manufacturer at $0 out of pocket until 05/29/19.    I called and spoke with patient.  She knows we will have to re-apply.   Patient knows to call the office with questions or concerns.   Oral Oncology Clinic will continue to follow.  [place] Patient [place] Phone [phone] Fax [phone] 10/04/2018    1:22 PM

## 2018-10-04 NOTE — Telephone Encounter (Signed)
Oral Chemotherapy Pharmacist Encounter   I spoke with patient for overview of: Tagrisso (osimertinib) for the treatment of previously treated, metastatic non-small cell lung cancer, EGFR mutation positive (originally exon 21 L858R, then T790M), now with documented leptomeningeal spread, planned duration until disease progression or unacceptable toxicity.   Counseled patient on administration, dosing, side effects, monitoring, drug-food interactions, safe handling, storage, and disposal.  Patient will take Tagrisso 80 tablets, 2 tablets ('160mg'$ ) by mouth once daily, without regard to food.  Tagrisso start date: TBD, pending medication acquisition  Adverse effects include but are not limited to: diarrhea, mouth sores, decreased appetitie, fatigue, dry skin, rash, nail changes, altered cardiac conduction, and decreased blood counts or electrolytes.  Patient will obtain anti diarrheal and alert the office of 4 or more loose stools above baseline.   Patient states she doesn't really remember her previous experience with Tagrisso, but does remember having less issues than when she was on Tarceva.  Reviewed with patient importance of keeping a medication schedule and plan for any missed doses.  Medication reconciliation performed and medication/allergy list updated.  Patient has now been enrolled with [employer] compassionate use program to receive her Tagrisso at $0 out of pocket cost. She will alert the office if she has not heard from them by early next week to schedule 1st shipment of Tagrisso.  All questions answered.  Ms. Lavery voiced understanding and appreciation.   Patient knows to call the office with questions or concerns.  [name], PharmD, BCPS, BCOP  10/04/2018   1:55 PM Oral Oncology Clinic [phone]

## 2018-10-07 ENCOUNTER — Ambulatory Visit
Admission: RE | Admit: 2018-10-07 | Discharge: 2018-10-07 | Disposition: A | Discharge status: Home / Self Care | Payer: Medicare Other | Source: Ambulatory Visit | Attending: Radiation Oncology | Admitting: Radiation Oncology

## 2018-10-07 ENCOUNTER — Other Ambulatory Visit: Payer: Self-pay

## 2018-10-07 DIAGNOSIS — C3412 Malignant neoplasm of upper lobe, left bronchus or lung: Secondary | ICD-10-CM | POA: Diagnosis not present

## 2018-10-07 DIAGNOSIS — Z510 Encounter for antineoplastic radiation therapy: Secondary | ICD-10-CM | POA: Diagnosis not present

## 2018-10-07 DIAGNOSIS — C7931 Secondary malignant neoplasm of brain: Secondary | ICD-10-CM | POA: Diagnosis not present

## 2018-10-08 ENCOUNTER — Ambulatory Visit
Admission: RE | Admit: 2018-10-08 | Discharge: 2018-10-08 | Disposition: A | Discharge status: Home / Self Care | Payer: Medicare Other | Source: Ambulatory Visit | Attending: Radiation Oncology | Admitting: Radiation Oncology

## 2018-10-08 DIAGNOSIS — Z510 Encounter for antineoplastic radiation therapy: Secondary | ICD-10-CM | POA: Diagnosis not present

## 2018-10-08 DIAGNOSIS — C3412 Malignant neoplasm of upper lobe, left bronchus or lung: Secondary | ICD-10-CM | POA: Diagnosis not present

## 2018-10-08 DIAGNOSIS — C7931 Secondary malignant neoplasm of brain: Secondary | ICD-10-CM | POA: Diagnosis not present

## 2018-10-08 NOTE — Telephone Encounter (Signed)
Oral Oncology Patient Advocate Encounter  The patient called to let me know that her Tagrisso had been delivered today. She stated that she was told to start taking the medicine when she received it, she will start tonight, 10/08/18.  I told her I would pass this information along. She verbalized great appreciation for the help she has received from everyone.  [place] Patient [place] Phone [phone] Fax [phone] 10/08/2018   11:58 AM

## 2018-10-09 ENCOUNTER — Ambulatory Visit
Admission: RE | Admit: 2018-10-09 | Discharge: 2018-10-09 | Disposition: A | Discharge status: Home / Self Care | Payer: Medicare Other | Source: Ambulatory Visit | Attending: Radiation Oncology | Admitting: Radiation Oncology

## 2018-10-09 ENCOUNTER — Other Ambulatory Visit: Payer: Self-pay

## 2018-10-09 ENCOUNTER — Other Ambulatory Visit: Payer: Self-pay | Admitting: Radiation Oncology

## 2018-10-09 DIAGNOSIS — C7931 Secondary malignant neoplasm of brain: Secondary | ICD-10-CM | POA: Diagnosis not present

## 2018-10-09 DIAGNOSIS — Z510 Encounter for antineoplastic radiation therapy: Secondary | ICD-10-CM | POA: Diagnosis not present

## 2018-10-09 DIAGNOSIS — C3412 Malignant neoplasm of upper lobe, left bronchus or lung: Secondary | ICD-10-CM | POA: Diagnosis not present

## 2018-10-09 DIAGNOSIS — C7949 Secondary malignant neoplasm of other parts of nervous system: Secondary | ICD-10-CM | POA: Diagnosis not present

## 2018-10-09 MED ORDER — FLUCONAZOLE 100 MG PO TABS: 100.0000 mg | ORAL_TABLET | Freq: Every day | ORAL | 0 refills | Status: DC

## 2018-10-10 ENCOUNTER — Ambulatory Visit
Admission: RE | Admit: 2018-10-10 | Discharge: 2018-10-10 | Disposition: A | Discharge status: Home / Self Care | Payer: Medicare Other | Source: Ambulatory Visit | Attending: Radiation Oncology | Admitting: Radiation Oncology

## 2018-10-10 ENCOUNTER — Other Ambulatory Visit: Payer: Self-pay

## 2018-10-10 DIAGNOSIS — C3412 Malignant neoplasm of upper lobe, left bronchus or lung: Secondary | ICD-10-CM | POA: Diagnosis not present

## 2018-10-10 DIAGNOSIS — C7931 Secondary malignant neoplasm of brain: Secondary | ICD-10-CM | POA: Diagnosis not present

## 2018-10-10 DIAGNOSIS — Z510 Encounter for antineoplastic radiation therapy: Secondary | ICD-10-CM | POA: Diagnosis not present

## 2018-10-11 ENCOUNTER — Ambulatory Visit
Admission: RE | Admit: 2018-10-11 | Discharge: 2018-10-11 | Disposition: A | Discharge status: Home / Self Care | Payer: Medicare Other | Source: Ambulatory Visit | Attending: Radiation Oncology | Admitting: Radiation Oncology

## 2018-10-11 ENCOUNTER — Other Ambulatory Visit: Payer: Self-pay

## 2018-10-11 DIAGNOSIS — Z510 Encounter for antineoplastic radiation therapy: Secondary | ICD-10-CM | POA: Diagnosis not present

## 2018-10-11 DIAGNOSIS — C7931 Secondary malignant neoplasm of brain: Secondary | ICD-10-CM | POA: Diagnosis not present

## 2018-10-11 DIAGNOSIS — C3412 Malignant neoplasm of upper lobe, left bronchus or lung: Secondary | ICD-10-CM | POA: Diagnosis not present

## 2018-10-14 ENCOUNTER — Other Ambulatory Visit: Payer: Self-pay

## 2018-10-14 ENCOUNTER — Ambulatory Visit
Admission: RE | Admit: 2018-10-14 | Discharge: 2018-10-14 | Disposition: A | Discharge status: Home / Self Care | Payer: Medicare Other | Source: Ambulatory Visit | Attending: Radiation Oncology | Admitting: Radiation Oncology

## 2018-10-14 DIAGNOSIS — C7931 Secondary malignant neoplasm of brain: Secondary | ICD-10-CM | POA: Diagnosis not present

## 2018-10-14 DIAGNOSIS — Z510 Encounter for antineoplastic radiation therapy: Secondary | ICD-10-CM | POA: Diagnosis not present

## 2018-10-14 DIAGNOSIS — C3412 Malignant neoplasm of upper lobe, left bronchus or lung: Secondary | ICD-10-CM | POA: Diagnosis not present

## 2018-10-15 ENCOUNTER — Inpatient Hospital Stay (HOSPITAL_BASED_OUTPATIENT_CLINIC_OR_DEPARTMENT_OTHER): Payer: Medicare Other | Admitting: Internal Medicine

## 2018-10-15 ENCOUNTER — Encounter: Payer: Self-pay | Admitting: Internal Medicine

## 2018-10-15 ENCOUNTER — Inpatient Hospital Stay: Payer: Medicare Other

## 2018-10-15 ENCOUNTER — Ambulatory Visit
Admission: RE | Admit: 2018-10-15 | Discharge: 2018-10-15 | Disposition: A | Discharge status: Home / Self Care | Payer: Medicare Other | Source: Ambulatory Visit | Attending: Radiation Oncology | Admitting: Radiation Oncology

## 2018-10-15 ENCOUNTER — Other Ambulatory Visit: Payer: Self-pay

## 2018-10-15 VITALS — BP 144/77 | HR 85 | Temp 98.5°F | Resp 20 | Ht 66.0 in | Wt 179.6 lb

## 2018-10-15 DIAGNOSIS — C778 Secondary and unspecified malignant neoplasm of lymph nodes of multiple regions: Secondary | ICD-10-CM | POA: Diagnosis not present

## 2018-10-15 DIAGNOSIS — Z510 Encounter for antineoplastic radiation therapy: Secondary | ICD-10-CM | POA: Diagnosis not present

## 2018-10-15 DIAGNOSIS — C7949 Secondary malignant neoplasm of other parts of nervous system: Secondary | ICD-10-CM | POA: Diagnosis not present

## 2018-10-15 DIAGNOSIS — D696 Thrombocytopenia, unspecified: Secondary | ICD-10-CM | POA: Diagnosis not present

## 2018-10-15 DIAGNOSIS — Z79899 Other long term (current) drug therapy: Secondary | ICD-10-CM

## 2018-10-15 DIAGNOSIS — Z9221 Personal history of antineoplastic chemotherapy: Secondary | ICD-10-CM | POA: Diagnosis not present

## 2018-10-15 DIAGNOSIS — R5383 Other fatigue: Secondary | ICD-10-CM | POA: Diagnosis not present

## 2018-10-15 DIAGNOSIS — I10 Essential (primary) hypertension: Secondary | ICD-10-CM | POA: Diagnosis not present

## 2018-10-15 DIAGNOSIS — N200 Calculus of kidney: Secondary | ICD-10-CM | POA: Diagnosis not present

## 2018-10-15 DIAGNOSIS — C3492 Malignant neoplasm of unspecified part of left bronchus or lung: Secondary | ICD-10-CM

## 2018-10-15 DIAGNOSIS — C3412 Malignant neoplasm of upper lobe, left bronchus or lung: Secondary | ICD-10-CM

## 2018-10-15 DIAGNOSIS — Z5111 Encounter for antineoplastic chemotherapy: Secondary | ICD-10-CM

## 2018-10-15 DIAGNOSIS — C787 Secondary malignant neoplasm of liver and intrahepatic bile duct: Secondary | ICD-10-CM

## 2018-10-15 DIAGNOSIS — Z7901 Long term (current) use of anticoagulants: Secondary | ICD-10-CM | POA: Diagnosis not present

## 2018-10-15 DIAGNOSIS — C7931 Secondary malignant neoplasm of brain: Secondary | ICD-10-CM | POA: Diagnosis not present

## 2018-10-15 LAB — CBC WITH DIFFERENTIAL (CANCER CENTER ONLY)
Abs Immature Granulocytes: 0.05 10*3/uL (ref 0.00–0.07)
Basophils Absolute: 0 10*3/uL (ref 0.0–0.1)
Basophils Relative: 0 %
Eosinophils Absolute: 0 10*3/uL (ref 0.0–0.5)
Eosinophils Relative: 0 %
HCT: 42.6 % (ref 36.0–46.0)
Hemoglobin: 14.3 g/dL (ref 12.0–15.0)
Immature Granulocytes: 0 %
Lymphocytes Relative: 5 %
Lymphs Abs: 0.7 10*3/uL (ref 0.7–4.0)
MCH: 31.3 pg (ref 26.0–34.0)
MCHC: 33.6 g/dL (ref 30.0–36.0)
MCV: 93.2 fL (ref 80.0–100.0)
Monocytes Absolute: 0.8 10*3/uL (ref 0.1–1.0)
Monocytes Relative: 6 %
Neutro Abs: 11.6 10*3/uL — ABNORMAL HIGH (ref 1.7–7.7)
Neutrophils Relative %: 89 %
Platelet Count: 63 10*3/uL — ABNORMAL LOW (ref 150–400)
RBC: 4.57 MIL/uL (ref 3.87–5.11)
RDW: 15 % (ref 11.5–15.5)
WBC Count: 13.2 10*3/uL — ABNORMAL HIGH (ref 4.0–10.5)
nRBC: 0 % (ref 0.0–0.2)

## 2018-10-15 LAB — CMP (CANCER CENTER ONLY)
ALT: 113 U/L — ABNORMAL HIGH (ref 0–44)
AST: 35 U/L (ref 15–41)
Albumin: 2.9 g/dL — ABNORMAL LOW (ref 3.5–5.0)
Alkaline Phosphatase: 108 U/L (ref 38–126)
Anion gap: 7 (ref 5–15)
BUN: 22 mg/dL (ref 8–23)
CO2: 24 mmol/L (ref 22–32)
Calcium: 8.5 mg/dL — ABNORMAL LOW (ref 8.9–10.3)
Chloride: 102 mmol/L (ref 98–111)
Creatinine: 0.76 mg/dL (ref 0.44–1.00)
GFR, Est AFR Am: >60 mL/min (ref >60)
GFR, Estimated: >60 mL/min (ref >60)
Glucose, Bld: 95 mg/dL (ref 70–99)
Potassium: 5.4 mmol/L — ABNORMAL HIGH (ref 3.5–5.1)
Sodium: 133 mmol/L — ABNORMAL LOW (ref 135–145)
Total Bilirubin: 1.3 mg/dL — ABNORMAL HIGH (ref 0.3–1.2)
Total Protein: 5.9 g/dL — ABNORMAL LOW (ref 6.5–8.1)

## 2018-10-15 NOTE — Progress Notes (Signed)
[facility] [phone]   Fax:(336) [place] [address]  DIAGNOSIS: Stage IV (T1b, N3, M1b) non-small cell lung cancer, adenocarcinoma with positive EGFR mutation in exon 21 (L858R) and recently found to have T790M resistant mutation, presented with left upper lobe lung mass in addition to left hilar and bilateral mediastinal lymphadenopathy as well as liver metastasis diagnosed in May of 2015.  PRIOR THERAPY: 1) Tarceva 150 mg by mouth daily. Started 11/25/2013. Status post 5 months of treatment discontinued today secondary to intolerance with persistent paronychia. 2) Tarceva 100 mg by mouth daily. Started 06/03/2014. Status post 8 months of treatment.  3) stereotactic radiotherapy to liver lesions under the care of Dr. Tammi Klippel. Last dose 03/02/2017 4) Tagrisso 80 mg by mouth daily status post 34 months of treatment. First dose was given 03/04/2015.  This was discontinued secondary to disease progression. 5) Systemic chemotherapy with carboplatin for AUC of 6, paclitaxel 200 mg/M2, Avastin 15 mg/KG and Tecentriq 1200 mg IV every 3 weeks with Neulasta support.  First dose 02/21/2018.  Status post 4 cycles.  Starting from cycle #4 the dose of carboplatin will be for AUC of 5 and paclitaxel 150 mg/M2. 6) Maintenance treatment with Avastin 15 mg/KG and Tecentriq 1200 mg IV every 3 weeks.  First cycle May 30, 2018.  Status post 4 cycles.  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 7) whole brain irradiation under the care of Dr. Sondra Come   CURRENT THERAPY: Tagrisso 160 mg p.o. daily for leptomeningeal disease and patient with EGFR mutation according to the Bloom study.  First dose was on Oct 08, 2018.  INTERVAL HISTORY: Debra Hodges 68 y.o. female returns to the clinic today for follow-up visit.  The patient is feeling well  except for the generalized fatigue that has been going on after her diagnosis with the leptomeningeal disease.  She denied having any current chest pain, shortness breath, cough or hemoptysis.  She denied having any nausea, vomiting, diarrhea or constipation.  She started the first dose of Tagrisso 160 mg p.o. daily from 01/08/2019.  She is tolerating it well with no concerning adverse effects.  She is here today for evaluation with repeat blood work.   MEDICAL HISTORY: Past Medical History:  Diagnosis Date  . High blood pressure   . Liver lesion 06/26/2016  . Non-small cell carcinoma of lung, stage 4 ([place]) dx'd 10/2013    ALLERGIES:  is allergic to peanut-containing drug products and penicillins.  MEDICATIONS:  Current Outpatient Medications  Medication Sig Dispense Refill  . acetaminophen (TYLENOL) 325 MG tablet Take 650 mg by mouth every 6 (six) hours as needed for mild pain. Reported on 06/15/2015    . apixaban (ELIQUIS) 5 MG TABS tablet Take 1 tablet (5 mg total) by mouth 2 (two) times daily. 60 tablet 2  . calcium carbonate (OS-CAL) 600 MG TABS tablet Take 600 mg by mouth daily.     Marland Kitchen CLIMARA 0.1 MG/24HR patch Place 1 patch onto the skin once a week. On Thursday    . dexamethasone (DECADRON) 4 MG tablet Take as directed. 30 tablet 0  . famotidine (PEPCID) 20 MG tablet Take by mouth.    . fluconazole (DIFLUCAN) 100 MG tablet Take 1 tablet (100 mg total) by mouth daily. 7 tablet 0  . gabapentin (NEURONTIN) 100 MG capsule TAKE 3 CAPSULES(300 MG)  BY MOUTH THREE TIMES DAILY 90 capsule 1  . levothyroxine (SYNTHROID, LEVOTHROID) 50 MCG tablet Take 50 mcg by mouth at bedtime.     Marland Kitchen loperamide (IMODIUM) 2 MG capsule Take 2 mg by mouth daily as needed for diarrhea or loose stools. Reported on 11/22/2015    . loratadine (CLARITIN) 10 MG tablet Take 10 mg by mouth daily.    . Multiple Vitamins-Calcium (ONE-A-DAY WOMENS PO) Take 1 tablet by mouth daily.    Marland Kitchen osimertinib mesylate (TAGRISSO) 80 MG  tablet Take 2 tablets (160 mg total) by mouth daily. 60 tablet 2  . oxyCODONE-acetaminophen (PERCOCET/ROXICET) 5-325 MG tablet Take 1 tablet by mouth every 6 (six) hours as needed for severe pain. (Patient not taking: Reported on 09/30/2018) 60 tablet 0  . potassium chloride SA (K-DUR,KLOR-CON) 20 MEQ tablet Take 1 tablet (20 mEq total) by mouth at bedtime. 10 tablet 0  . prochlorperazine (COMPAZINE) 10 MG tablet TAKE 1 TABLET(10 MG) BY MOUTH EVERY 6 HOURS AS NEEDED FOR NAUSEA OR VOMITING (Patient taking differently: Take 10 mg by mouth every 6 (six) hours as needed for nausea or vomiting. ) 385 tablet 3  . Sodium Fluoride (CLINPRO 5000) 1.1 % PSTE Place 1 application onto teeth 2 (two) times daily.     Marland Kitchen spironolactone (ALDACTONE) 25 MG tablet Take 1 tablet (25 mg total) by mouth daily. 30 tablet 0  . vitamin C (ASCORBIC ACID) 500 MG tablet Take 500 mg by mouth daily.     No current facility-administered medications for this visit.     SURGICAL HISTORY:  Past Surgical History:  Procedure Laterality Date  . ABDOMINAL HYSTERECTOMY    . BREAST BIOPSY     Right breast  . VIDEO BRONCHOSCOPY Bilateral 10/27/2013   Procedure: VIDEO BRONCHOSCOPY WITH FLUORO;  Surgeon: Collene Gobble, MD;  Location: [facility];  Service: Cardiopulmonary;  Laterality: Bilateral;    REVIEW OF SYSTEMS:  A comprehensive review of systems was negative except for: Constitutional: positive for fatigue Musculoskeletal: positive for muscle weakness   PHYSICAL EXAMINATION: General appearance: alert, cooperative, fatigued and no distress Head: Normocephalic, without obvious abnormality, atraumatic Neck: no adenopathy, no JVD, supple, symmetrical, trachea midline and thyroid not enlarged, symmetric, no tenderness/mass/nodules Lymph nodes: Cervical, supraclavicular, and axillary nodes normal. Resp: clear to auscultation bilaterally Back: symmetric, no curvature. ROM normal. No CVA tenderness. Cardio: regular rate and rhythm,  S1, S2 normal, no murmur, click, rub or gallop GI: soft, non-tender; bowel sounds normal; no masses,  no organomegaly Extremities: extremities normal, atraumatic, no cyanosis or edema  ECOG PERFORMANCE STATUS: 1 - Symptomatic but completely ambulatory  Blood pressure (!) 144/77, pulse 85, temperature 98.5 F (36.9 C), temperature source Oral, resp. rate 20, height '5\' 6"'$  (1.676 m), weight 179 lb 9.6 oz (81.5 kg), SpO2 100 %.  LABORATORY DATA: Lab Results  Component Value Date   WBC 13.2 (H) 10/15/2018   HGB 14.3 10/15/2018   HCT 42.6 10/15/2018   MCV 93.2 10/15/2018   PLT 63 (L) 10/15/2018      Chemistry      Component Value Date/Time   NA 137 10/02/2018 0932   NA 141 04/30/2017 1541   K 4.3 10/02/2018 0932   K 4.1 04/30/2017 1541   CL 107 10/02/2018 0932   CO2 20 (L) 10/02/2018 0932   CO2 26 04/30/2017 1541   BUN 31 (H) 10/02/2018 0932   BUN 14.1 04/30/2017 1541   CREATININE 0.68 10/02/2018 0932   CREATININE 0.8 04/30/2017 1541  Component Value Date/Time   CALCIUM 7.9 (L) 10/02/2018 0932   CALCIUM 9.6 04/30/2017 1541   ALKPHOS 131 (H) 10/02/2018 0932   ALKPHOS 105 04/30/2017 1541   AST 42 (H) 10/02/2018 0932   AST 40 (H) 04/30/2017 1541   ALT 80 (H) 10/02/2018 0932   ALT 27 04/30/2017 1541   BILITOT 1.0 10/02/2018 0932   BILITOT 0.55 04/30/2017 1541       RADIOGRAPHIC STUDIES: No results found.  ASSESSMENT AND PLAN:  This is a very pleasant 68 years old white female with a stage IV non-small cell lung cancer, adenocarcinoma with positive EGFR mutation in exon 21 (L858R) diagnosed in May 2015 status post treatment with Tarceva for 13 months discontinued secondary to disease progression and development of T790M resistant mutation. The patient is currently undergoing treatment with Tagrisso 80 mg by mouth daily status post 34 months. I also referred the patient to Dr. Tammi Klippel from radiation oncology for consideration of palliative radiotherapy to the enlarging  left upper lobe suprahilar mass to avoid further obstruction of her bronchial tree. Repeat CT scan of the chest, abdomen and pelvis showed findings for disease progression especially in the liver. The patient discontinued her treatment with Tagrisso and she was started on systemic chemotherapy with carboplatin for AUC of 6, paclitaxel 200 mg/M2, Avastin 15 mg/KG as well as Tecentriq (Atezolizumab) 1200 mg IV every 3 weeks status post 4 cycles.   Starting from cycle #5 the patient will be on maintenance treatment with a Avastin 15 mg/KG as well as Tecentriq 1200 mg IV every 3 weeks.  First dose will be on May 30, 2018.  She is status post 6 cycles of the maintenance phase of her treatment. 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  She is currently on Decadron 4 mg p.o. twice daily.  She is expected to start whole brain irradiation soon. The patient was started on treatment with Tagrisso 160 mg p.o. daily status post 1 week of treatment and tolerated the first week of her treatment fairly well.  CBC today showed thrombocytopenia which is an expected adverse effect of Tagrisso. I recommended for the patient to continue her current treatment with the same dose for now.  I will see her back for follow-up visit in 2 weeks for evaluation and repeat blood work. For the peripheral neuropathy, the patient on Neurontin 100 mg p.o. 3 times daily. For the history of pulmonary embolism, she is currently on Xarelto. She was advised to call immediately if she has any concerning symptoms in the interval. The patient voices understanding of current disease status and treatment options and is in agreement with the current care plan. All questions were answered. The patient knows to call the clinic with any problems, questions or concerns. We can certainly see the patient much sooner if necessary.  Disclaimer: This note was  dictated with voice recognition software. Similar sounding words can inadvertently be transcribed and may not be corrected upon review.

## 2018-10-16 ENCOUNTER — Ambulatory Visit
Admission: RE | Admit: 2018-10-16 | Discharge: 2018-10-16 | Disposition: A | Discharge status: Home / Self Care | Payer: Medicare Other | Source: Ambulatory Visit | Attending: Radiation Oncology | Admitting: Radiation Oncology

## 2018-10-16 ENCOUNTER — Encounter: Payer: Self-pay | Admitting: Radiation Oncology

## 2018-10-16 ENCOUNTER — Telehealth: Payer: Self-pay | Admitting: Internal Medicine

## 2018-10-16 ENCOUNTER — Other Ambulatory Visit: Payer: Self-pay

## 2018-10-16 ENCOUNTER — Other Ambulatory Visit: Payer: Self-pay | Admitting: Internal Medicine

## 2018-10-16 DIAGNOSIS — Z510 Encounter for antineoplastic radiation therapy: Secondary | ICD-10-CM | POA: Diagnosis not present

## 2018-10-16 DIAGNOSIS — C7931 Secondary malignant neoplasm of brain: Secondary | ICD-10-CM | POA: Diagnosis not present

## 2018-10-16 DIAGNOSIS — C7949 Secondary malignant neoplasm of other parts of nervous system: Secondary | ICD-10-CM | POA: Diagnosis not present

## 2018-10-16 DIAGNOSIS — C3412 Malignant neoplasm of upper lobe, left bronchus or lung: Secondary | ICD-10-CM | POA: Diagnosis not present

## 2018-10-16 NOTE — Progress Notes (Signed)
  Radiation Oncology         [phone]) [phone] ________________________________  Name: Debra Hodges MRN: [id number]  Date: 10/16/2018  DOB: 1950-06-14  End of Treatment Note  Diagnosis:   Stage IV(T1b, N3, M1b)EGFR+adenocarcinomaof the left upper lungwithliver metastases, now withintracranial metastases and diffuse leptomeningeal spread     Indication for treatment:  Palliative       Radiation treatment dates:   10/03/18-10/16/18  Site/dose:   Brain; 10 fractions of 3 Gy for a total of 30 Gy  Beams/energy:   Photon 3D; 6X  Narrative: The patient tolerated radiation treatment relatively well.     At the beginning of treatment, pt reported moderate fatigue and rare blurred vision. Pt denied headaches, hearing changes, and N/V throughout treatments. Towards the end of treatment, pt reported increased fatigue. Overall the pt was without complaints.   Plan: The patient has completed radiation treatment. The patient will return to radiation oncology clinic for routine followup in one month. I advised them to call or return sooner if they have any questions or concerns related to their recovery or treatment.  -----------------------------------  Blair Promise, PhD, MD  This document serves as a record of services personally performed by Gery Pray, MD. It was created on his behalf by Mary-Margaret Loma Messing, a trained medical scribe. The creation of this record is based on the scribe's personal observations and the provider's statements to them. This document has been checked and approved by the attending provider.

## 2018-10-16 NOTE — Telephone Encounter (Signed)
Scheduled appt per 5/19 los - unable to reach patient . Left message with appt date and time

## 2018-10-23 ENCOUNTER — Other Ambulatory Visit: Payer: Medicare Other

## 2018-10-23 ENCOUNTER — Ambulatory Visit: Payer: Medicare Other | Admitting: Internal Medicine

## 2018-10-23 ENCOUNTER — Ambulatory Visit: Payer: Medicare Other

## 2018-10-29 ENCOUNTER — Inpatient Hospital Stay (HOSPITAL_BASED_OUTPATIENT_CLINIC_OR_DEPARTMENT_OTHER): Payer: Medicare Other | Admitting: Internal Medicine

## 2018-10-29 ENCOUNTER — Inpatient Hospital Stay: Payer: Medicare Other | Attending: Internal Medicine

## 2018-10-29 ENCOUNTER — Encounter: Payer: Self-pay | Admitting: Internal Medicine

## 2018-10-29 ENCOUNTER — Other Ambulatory Visit: Payer: Self-pay

## 2018-10-29 VITALS — BP 130/53 | HR 95 | Temp 98.5°F | Resp 18 | Ht 66.0 in

## 2018-10-29 DIAGNOSIS — Z7901 Long term (current) use of anticoagulants: Secondary | ICD-10-CM | POA: Diagnosis not present

## 2018-10-29 DIAGNOSIS — C7949 Secondary malignant neoplasm of other parts of nervous system: Secondary | ICD-10-CM | POA: Diagnosis not present

## 2018-10-29 DIAGNOSIS — C3412 Malignant neoplasm of upper lobe, left bronchus or lung: Secondary | ICD-10-CM

## 2018-10-29 DIAGNOSIS — I10 Essential (primary) hypertension: Secondary | ICD-10-CM | POA: Insufficient documentation

## 2018-10-29 DIAGNOSIS — R5383 Other fatigue: Secondary | ICD-10-CM

## 2018-10-29 DIAGNOSIS — Z79899 Other long term (current) drug therapy: Secondary | ICD-10-CM | POA: Diagnosis not present

## 2018-10-29 DIAGNOSIS — C3492 Malignant neoplasm of unspecified part of left bronchus or lung: Secondary | ICD-10-CM

## 2018-10-29 DIAGNOSIS — R21 Rash and other nonspecific skin eruption: Secondary | ICD-10-CM

## 2018-10-29 DIAGNOSIS — C787 Secondary malignant neoplasm of liver and intrahepatic bile duct: Secondary | ICD-10-CM

## 2018-10-29 DIAGNOSIS — R531 Weakness: Secondary | ICD-10-CM | POA: Insufficient documentation

## 2018-10-29 DIAGNOSIS — Z5111 Encounter for antineoplastic chemotherapy: Secondary | ICD-10-CM

## 2018-10-29 DIAGNOSIS — E039 Hypothyroidism, unspecified: Secondary | ICD-10-CM

## 2018-10-29 DIAGNOSIS — C3490 Malignant neoplasm of unspecified part of unspecified bronchus or lung: Secondary | ICD-10-CM

## 2018-10-29 LAB — CMP (CANCER CENTER ONLY)
ALT: 44 U/L (ref 0–44)
AST: 30 U/L (ref 15–41)
Albumin: 2.6 g/dL — ABNORMAL LOW (ref 3.5–5.0)
Alkaline Phosphatase: 112 U/L (ref 38–126)
Anion gap: 6 (ref 5–15)
BUN: 18 mg/dL (ref 8–23)
CO2: 24 mmol/L (ref 22–32)
Calcium: 8.3 mg/dL — ABNORMAL LOW (ref 8.9–10.3)
Chloride: 103 mmol/L (ref 98–111)
Creatinine: 0.75 mg/dL (ref 0.44–1.00)
GFR, Est AFR Am: >60 mL/min (ref >60)
GFR, Estimated: >60 mL/min (ref >60)
Glucose, Bld: 92 mg/dL (ref 70–99)
Potassium: 4.5 mmol/L (ref 3.5–5.1)
Sodium: 133 mmol/L — ABNORMAL LOW (ref 135–145)
Total Bilirubin: 0.7 mg/dL (ref 0.3–1.2)
Total Protein: 5.9 g/dL — ABNORMAL LOW (ref 6.5–8.1)

## 2018-10-29 LAB — CBC WITH DIFFERENTIAL (CANCER CENTER ONLY)
Abs Immature Granulocytes: 0.03 10*3/uL (ref 0.00–0.07)
Basophils Absolute: 0 10*3/uL (ref 0.0–0.1)
Basophils Relative: 0 %
Eosinophils Absolute: 0 10*3/uL (ref 0.0–0.5)
Eosinophils Relative: 1 %
HCT: 36.8 % (ref 36.0–46.0)
Hemoglobin: 12.5 g/dL (ref 12.0–15.0)
Immature Granulocytes: 1 %
Lymphocytes Relative: 30 %
Lymphs Abs: 1.1 10*3/uL (ref 0.7–4.0)
MCH: 31.7 pg (ref 26.0–34.0)
MCHC: 34 g/dL (ref 30.0–36.0)
MCV: 93.4 fL (ref 80.0–100.0)
Monocytes Absolute: 0.4 10*3/uL (ref 0.1–1.0)
Monocytes Relative: 12 %
Neutro Abs: 2.1 10*3/uL (ref 1.7–7.7)
Neutrophils Relative %: 56 %
Platelet Count: 67 10*3/uL — ABNORMAL LOW (ref 150–400)
RBC: 3.94 MIL/uL (ref 3.87–5.11)
RDW: 15.5 % (ref 11.5–15.5)
WBC Count: 3.6 10*3/uL — ABNORMAL LOW (ref 4.0–10.5)
nRBC: 0 % (ref 0.0–0.2)

## 2018-10-29 LAB — TSH: TSH: <0.08 u[IU]/mL — ABNORMAL LOW (ref 0.308–3.960)

## 2018-10-29 NOTE — Progress Notes (Signed)
Skin alteration - Peeling , itching ,bumpy skin on forehead and all over scalp started 1 week ago which was was 2 week after completing xrt.. Desquamation from XRT? Message to XRT.

## 2018-10-29 NOTE — Addendum Note (Signed)
Addended by: Ardeen Garland on: 10/29/2018 11:50 AM   Modules accepted: Orders

## 2018-10-29 NOTE — Progress Notes (Signed)
[place] [phone]   Fax:(336) [place] [phone]  DIAGNOSIS: Stage IV (T1b, N3, M1b) non-small cell lung cancer, adenocarcinoma with positive EGFR mutation in exon 21 (L858R) and recently found to have T790M resistant mutation, presented with left upper lobe lung mass in addition to left hilar and bilateral mediastinal lymphadenopathy as well as liver metastasis diagnosed in May of 2015.  PRIOR THERAPY: 1) Tarceva 150 mg by mouth daily. Started 11/25/2013. Status post 5 months of treatment discontinued today secondary to intolerance with persistent paronychia. 2) Tarceva 100 mg by mouth daily. Started 06/03/2014. Status post 8 months of treatment.  3) stereotactic radiotherapy to liver lesions under the care of Dr. Tammi Klippel. Last dose 03/02/2017 4) Tagrisso 80 mg by mouth daily status post 34 months of treatment. First dose was given 03/04/2015.  This was discontinued secondary to disease progression. 5) Systemic chemotherapy with carboplatin for AUC of 6, paclitaxel 200 mg/M2, Avastin 15 mg/KG and Tecentriq 1200 mg IV every 3 weeks with Neulasta support.  First dose 02/21/2018.  Status post 4 cycles.  Starting from cycle #4 the dose of carboplatin will be for AUC of 5 and paclitaxel 150 mg/M2. 6) Maintenance treatment with Avastin 15 mg/KG and Tecentriq 1200 mg IV every 3 weeks.  First cycle May 30, 2018.  Status post 4 cycles.  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 7) whole brain irradiation under the care of Dr. Sondra Come   CURRENT THERAPY: Tagrisso 160 mg p.o. daily for leptomeningeal disease and patient with EGFR mutation according to the Bloom study.  First dose was on Oct 08, 2018.  INTERVAL HISTORY: Debra Hodges 68 y.o. female returns to the clinic today for follow-up visit.  The patient continues to  complain of increasing fatigue and weakness.  She also developed a rash on her front head and scalp a week ago after completion of the brain radiation.  She did not apply any creams or lotion to the area.  It is itching at times.  She denied having any current chest pain, shortness of breath except with exertion with no cough or hemoptysis.  She denied having any fever or chills.  She has no recent weight loss or night sweats.  She continues to tolerate her treatment with Tagrisso fairly well.  She is here today for evaluation and repeat blood work.  MEDICAL HISTORY: Past Medical History:  Diagnosis Date  . High blood pressure   . Liver lesion 06/26/2016  . Non-small cell carcinoma of lung, stage 4 ([place]) dx'd 10/2013    ALLERGIES:  is allergic to peanut-containing drug products and penicillins.  MEDICATIONS:  Current Outpatient Medications  Medication Sig Dispense Refill  . acetaminophen (TYLENOL) 325 MG tablet Take 650 mg by mouth every 6 (six) hours as needed for mild pain. Reported on 06/15/2015    . calcium carbonate (OS-CAL) 600 MG TABS tablet Take 600 mg by mouth daily.     Marland Kitchen CLIMARA 0.1 MG/24HR patch Place 1 patch onto the skin once a week. On Thursday    . dexamethasone (DECADRON) 4 MG tablet Take as directed. 30 tablet 0  . ELIQUIS 5 MG TABS tablet TAKE 1 TABLET(5 MG) BY MOUTH TWICE DAILY 60 tablet 2  . fluconazole (DIFLUCAN) 100 MG tablet Take 1 tablet (100 mg total) by mouth daily. 7 tablet 0  .  gabapentin (NEURONTIN) 100 MG capsule TAKE 3 CAPSULES(300 MG) BY MOUTH THREE TIMES DAILY 90 capsule 1  . levothyroxine (SYNTHROID, LEVOTHROID) 50 MCG tablet Take 50 mcg by mouth at bedtime.     Marland Kitchen loperamide (IMODIUM) 2 MG capsule Take 2 mg by mouth daily as needed for diarrhea or loose stools. Reported on 11/22/2015    . loratadine (CLARITIN) 10 MG tablet Take 10 mg by mouth daily.    . Multiple Vitamins-Calcium (ONE-A-DAY WOMENS PO) Take 1 tablet by mouth daily.    Marland Kitchen osimertinib mesylate  (TAGRISSO) 80 MG tablet Take 2 tablets (160 mg total) by mouth daily. 60 tablet 2  . oxyCODONE-acetaminophen (PERCOCET/ROXICET) 5-325 MG tablet Take 1 tablet by mouth every 6 (six) hours as needed for severe pain. (Patient not taking: Reported on 09/30/2018) 60 tablet 0  . potassium chloride SA (K-DUR,KLOR-CON) 20 MEQ tablet Take 1 tablet (20 mEq total) by mouth at bedtime. 10 tablet 0  . prochlorperazine (COMPAZINE) 10 MG tablet TAKE 1 TABLET(10 MG) BY MOUTH EVERY 6 HOURS AS NEEDED FOR NAUSEA OR VOMITING (Patient taking differently: Take 10 mg by mouth every 6 (six) hours as needed for nausea or vomiting. ) 385 tablet 3  . Sodium Fluoride (CLINPRO 5000) 1.1 % PSTE Place 1 application onto teeth 2 (two) times daily.     Marland Kitchen spironolactone (ALDACTONE) 25 MG tablet Take 1 tablet (25 mg total) by mouth daily. 30 tablet 0  . vitamin C (ASCORBIC ACID) 500 MG tablet Take 500 mg by mouth daily.     No current facility-administered medications for this visit.     SURGICAL HISTORY:  Past Surgical History:  Procedure Laterality Date  . ABDOMINAL HYSTERECTOMY    . BREAST BIOPSY     Right breast  . VIDEO BRONCHOSCOPY Bilateral 10/27/2013   Procedure: VIDEO BRONCHOSCOPY WITH FLUORO;  Surgeon: Collene Gobble, MD;  Location: [facility];  Service: Cardiopulmonary;  Laterality: Bilateral;    REVIEW OF SYSTEMS:  A comprehensive review of systems was negative except for: Constitutional: positive for fatigue Respiratory: positive for dyspnea on exertion Integument/breast: positive for rash Musculoskeletal: positive for muscle weakness   PHYSICAL EXAMINATION: General appearance: alert, cooperative, fatigued and no distress Head: Normocephalic, without obvious abnormality, atraumatic Neck: no adenopathy, no JVD, supple, symmetrical, trachea midline and thyroid not enlarged, symmetric, no tenderness/mass/nodules Lymph nodes: Cervical, supraclavicular, and axillary nodes normal. Resp: clear to auscultation  bilaterally Back: symmetric, no curvature. ROM normal. No CVA tenderness. Cardio: regular rate and rhythm, S1, S2 normal, no murmur, click, rub or gallop GI: soft, non-tender; bowel sounds normal; no masses,  no organomegaly Extremities: extremities normal, atraumatic, no cyanosis or edema  ECOG PERFORMANCE STATUS: 1 - Symptomatic but completely ambulatory  Blood pressure (!) 130/53, pulse 95, temperature 98.5 F (36.9 C), temperature source Oral, resp. rate 18, height '5\' 6"'$  (1.676 m), SpO2 99 %.  LABORATORY DATA: Lab Results  Component Value Date   WBC 3.6 (L) 10/29/2018   HGB 12.5 10/29/2018   HCT 36.8 10/29/2018   MCV 93.4 10/29/2018   PLT 67 (L) 10/29/2018      Chemistry      Component Value Date/Time   NA 133 (L) 10/15/2018 1037   NA 141 04/30/2017 1541   K 5.4 (H) 10/15/2018 1037   K 4.1 04/30/2017 1541   CL 102 10/15/2018 1037   CO2 24 10/15/2018 1037   CO2 26 04/30/2017 1541   BUN 22 10/15/2018 1037   BUN 14.1 04/30/2017 1541  CREATININE 0.76 10/15/2018 1037   CREATININE 0.8 04/30/2017 1541      Component Value Date/Time   CALCIUM 8.5 (L) 10/15/2018 1037   CALCIUM 9.6 04/30/2017 1541   ALKPHOS 108 10/15/2018 1037   ALKPHOS 105 04/30/2017 1541   AST 35 10/15/2018 1037   AST 40 (H) 04/30/2017 1541   ALT 113 (H) 10/15/2018 1037   ALT 27 04/30/2017 1541   BILITOT 1.3 (H) 10/15/2018 1037   BILITOT 0.55 04/30/2017 1541       RADIOGRAPHIC STUDIES: No results found.  ASSESSMENT AND PLAN:  This is a very pleasant 68 years old white female with a stage IV non-small cell lung cancer, adenocarcinoma with positive EGFR mutation in exon 21 (L858R) diagnosed in May 2015 status post treatment with Tarceva for 13 months discontinued secondary to disease progression and development of T790M resistant mutation. The patient is currently undergoing treatment with Tagrisso 80 mg by mouth daily status post 34 months. I also referred the patient to Dr. Tammi Klippel from radiation  oncology for consideration of palliative radiotherapy to the enlarging left upper lobe suprahilar mass to avoid further obstruction of her bronchial tree. Repeat CT scan of the chest, abdomen and pelvis showed findings for disease progression especially in the liver. The patient discontinued her treatment with Tagrisso and she was started on systemic chemotherapy with carboplatin for AUC of 6, paclitaxel 200 mg/M2, Avastin 15 mg/KG as well as Tecentriq (Atezolizumab) 1200 mg IV every 3 weeks status post 4 cycles.   Starting from cycle #5 the patient will be on maintenance treatment with a Avastin 15 mg/KG as well as Tecentriq 1200 mg IV every 3 weeks.  First dose will be on May 30, 2018.  She is status post 6 cycles of the maintenance phase of her treatment. 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  She is currently on Decadron 4 mg p.o. twice daily.  She is expected to start whole brain irradiation soon. The patient was started on treatment with Tagrisso 160 mg p.o. daily status post 3 weeks of treatment.  She is tolerating this treatment well with no concerning adverse effects. I recommended for the patient to continue her current treatment with Tagrisso with the same dose. I will see her back for follow-up visit in around 5 weeks with repeat CT scan of the chest, abdomen and pelvis as well as MRI of the brain for restaging of her disease. For the peripheral neuropathy, the patient on Neurontin 100 mg p.o. 3 times daily. For the history of pulmonary embolism, she is currently on Xarelto. For the skin rash, will discuss with radiation oncology for recommendation regarding the radiation-induced rash. The patient was advised to call immediately if she has any concerning symptoms in the interval. The patient voices understanding of current disease status and treatment options and is in agreement with the current  care plan. All questions were answered. The patient knows to call the clinic with any problems, questions or concerns. We can certainly see the patient much sooner if necessary.  Disclaimer: This note was dictated with voice recognition software. Similar sounding words can inadvertently be transcribed and may not be corrected upon review.

## 2018-10-30 ENCOUNTER — Telehealth: Payer: Self-pay | Admitting: Internal Medicine

## 2018-10-30 NOTE — Telephone Encounter (Signed)
Scheduled appt per 6/2 los - mailed letter with appt date and time  [facility] to contact patient with ct scan

## 2018-11-01 ENCOUNTER — Other Ambulatory Visit: Payer: Self-pay | Admitting: Internal Medicine

## 2018-11-17 ENCOUNTER — Encounter (HOSPITAL_COMMUNITY): Payer: Self-pay | Admitting: Emergency Medicine

## 2018-11-17 ENCOUNTER — Other Ambulatory Visit: Payer: Self-pay

## 2018-11-17 ENCOUNTER — Inpatient Hospital Stay (HOSPITAL_COMMUNITY)
Admission: EM | Admit: 2018-11-17 | Discharge: 2018-11-20 | DRG: 391 | Disposition: A | Discharge status: Home / Self Care | Payer: Medicare Other | Attending: Internal Medicine | Admitting: Internal Medicine

## 2018-11-17 DIAGNOSIS — R112 Nausea with vomiting, unspecified: Secondary | ICD-10-CM | POA: Diagnosis not present

## 2018-11-17 DIAGNOSIS — C787 Secondary malignant neoplasm of liver and intrahepatic bile duct: Secondary | ICD-10-CM | POA: Diagnosis not present

## 2018-11-17 DIAGNOSIS — Z79890 Hormone replacement therapy (postmenopausal): Secondary | ICD-10-CM

## 2018-11-17 DIAGNOSIS — R531 Weakness: Secondary | ICD-10-CM

## 2018-11-17 DIAGNOSIS — Z1159 Encounter for screening for other viral diseases: Secondary | ICD-10-CM | POA: Diagnosis not present

## 2018-11-17 DIAGNOSIS — T451X5A Adverse effect of antineoplastic and immunosuppressive drugs, initial encounter: Secondary | ICD-10-CM | POA: Diagnosis present

## 2018-11-17 DIAGNOSIS — Z79899 Other long term (current) drug therapy: Secondary | ICD-10-CM

## 2018-11-17 DIAGNOSIS — G629 Polyneuropathy, unspecified: Secondary | ICD-10-CM | POA: Diagnosis present

## 2018-11-17 DIAGNOSIS — Z90710 Acquired absence of both cervix and uterus: Secondary | ICD-10-CM

## 2018-11-17 DIAGNOSIS — C3490 Malignant neoplasm of unspecified part of unspecified bronchus or lung: Secondary | ICD-10-CM

## 2018-11-17 DIAGNOSIS — B372 Candidiasis of skin and nail: Secondary | ICD-10-CM | POA: Diagnosis present

## 2018-11-17 DIAGNOSIS — I10 Essential (primary) hypertension: Secondary | ICD-10-CM | POA: Diagnosis present

## 2018-11-17 DIAGNOSIS — L304 Erythema intertrigo: Secondary | ICD-10-CM | POA: Diagnosis present

## 2018-11-17 DIAGNOSIS — R197 Diarrhea, unspecified: Secondary | ICD-10-CM | POA: Diagnosis not present

## 2018-11-17 DIAGNOSIS — E039 Hypothyroidism, unspecified: Secondary | ICD-10-CM | POA: Diagnosis present

## 2018-11-17 DIAGNOSIS — D6959 Other secondary thrombocytopenia: Secondary | ICD-10-CM | POA: Diagnosis present

## 2018-11-17 DIAGNOSIS — D61810 Antineoplastic chemotherapy induced pancytopenia: Secondary | ICD-10-CM | POA: Diagnosis present

## 2018-11-17 DIAGNOSIS — C7931 Secondary malignant neoplasm of brain: Secondary | ICD-10-CM | POA: Diagnosis not present

## 2018-11-17 DIAGNOSIS — C7949 Secondary malignant neoplasm of other parts of nervous system: Secondary | ICD-10-CM

## 2018-11-17 DIAGNOSIS — Z79891 Long term (current) use of opiate analgesic: Secondary | ICD-10-CM

## 2018-11-17 DIAGNOSIS — Z880 Allergy status to penicillin: Secondary | ICD-10-CM

## 2018-11-17 DIAGNOSIS — Z8249 Family history of ischemic heart disease and other diseases of the circulatory system: Secondary | ICD-10-CM

## 2018-11-17 DIAGNOSIS — K529 Noninfective gastroenteritis and colitis, unspecified: Secondary | ICD-10-CM | POA: Diagnosis present

## 2018-11-17 DIAGNOSIS — Z7901 Long term (current) use of anticoagulants: Secondary | ICD-10-CM

## 2018-11-17 DIAGNOSIS — B370 Candidal stomatitis: Secondary | ICD-10-CM | POA: Diagnosis present

## 2018-11-17 DIAGNOSIS — L659 Nonscarring hair loss, unspecified: Secondary | ICD-10-CM | POA: Diagnosis present

## 2018-11-17 DIAGNOSIS — Z91010 Allergy to peanuts: Secondary | ICD-10-CM

## 2018-11-17 DIAGNOSIS — Z808 Family history of malignant neoplasm of other organs or systems: Secondary | ICD-10-CM

## 2018-11-17 DIAGNOSIS — Z800 Family history of malignant neoplasm of digestive organs: Secondary | ICD-10-CM

## 2018-11-17 DIAGNOSIS — Z86711 Personal history of pulmonary embolism: Secondary | ICD-10-CM

## 2018-11-17 DIAGNOSIS — C3412 Malignant neoplasm of upper lobe, left bronchus or lung: Secondary | ICD-10-CM | POA: Diagnosis not present

## 2018-11-17 DIAGNOSIS — Z803 Family history of malignant neoplasm of breast: Secondary | ICD-10-CM

## 2018-11-17 DIAGNOSIS — Y929 Unspecified place or not applicable: Secondary | ICD-10-CM

## 2018-11-17 LAB — COMPREHENSIVE METABOLIC PANEL
ALT: 22 U/L (ref 0–44)
AST: 37 U/L (ref 15–41)
Albumin: 3.4 g/dL — ABNORMAL LOW (ref 3.5–5.0)
Alkaline Phosphatase: 79 U/L (ref 38–126)
Anion gap: 14 (ref 5–15)
BUN: 25 mg/dL — ABNORMAL HIGH (ref 8–23)
CO2: 23 mmol/L (ref 22–32)
Calcium: 8.8 mg/dL — ABNORMAL LOW (ref 8.9–10.3)
Chloride: 101 mmol/L (ref 98–111)
Creatinine, Ser: 0.91 mg/dL (ref 0.44–1.00)
GFR calc Af Amer: >60 mL/min (ref >60)
GFR calc non Af Amer: >60 mL/min (ref >60)
Glucose, Bld: 101 mg/dL — ABNORMAL HIGH (ref 70–99)
Potassium: 3.9 mmol/L (ref 3.5–5.1)
Sodium: 138 mmol/L (ref 135–145)
Total Bilirubin: 1.5 mg/dL — ABNORMAL HIGH (ref 0.3–1.2)
Total Protein: 7.2 g/dL (ref 6.5–8.1)

## 2018-11-17 LAB — CBC
HCT: 35 % — ABNORMAL LOW (ref 36.0–46.0)
Hemoglobin: 11.5 g/dL — ABNORMAL LOW (ref 12.0–15.0)
MCH: 30.7 pg (ref 26.0–34.0)
MCHC: 32.9 g/dL (ref 30.0–36.0)
MCV: 93.3 fL (ref 80.0–100.0)
Platelets: 96 10*3/uL — ABNORMAL LOW (ref 150–400)
RBC: 3.75 MIL/uL — ABNORMAL LOW (ref 3.87–5.11)
RDW: 15.3 % (ref 11.5–15.5)
WBC: 4.8 10*3/uL (ref 4.0–10.5)
nRBC: 0 % (ref 0.0–0.2)

## 2018-11-17 LAB — SARS CORONAVIRUS 2 BY RT PCR (HOSPITAL ORDER, PERFORMED IN ~~LOC~~ HOSPITAL LAB): SARS Coronavirus 2: NEGATIVE

## 2018-11-17 LAB — LIPASE, BLOOD: Lipase: 30 U/L (ref 11–51)

## 2018-11-17 MED ORDER — ONDANSETRON 4 MG PO TBDP: 4.0000 mg | ORAL_TABLET | Freq: Once | ORAL | Status: DC | PRN

## 2018-11-17 MED ORDER — ONDANSETRON HCL 4 MG/2ML IJ SOLN
4.0000 mg | Freq: Once | INTRAMUSCULAR | Status: AC
  Administered 2018-11-17: 4 mg via INTRAVENOUS
  Filled 2018-11-17: qty 2

## 2018-11-17 MED ORDER — SODIUM CHLORIDE 0.9% FLUSH: 3.0000 mL | Freq: Once | INTRAVENOUS | Status: DC

## 2018-11-17 MED ORDER — SODIUM CHLORIDE 0.9 % IV BOLUS
1000.0000 mL | Freq: Once | INTRAVENOUS | Status: AC
  Administered 2018-11-17: 1000 mL via INTRAVENOUS

## 2018-11-17 NOTE — H&P (Signed)
History and Physical   [facility] - [place] @ [place] Admission History and Physical [employer], D.O.    Patient Name: Debra Hodges MR#: [id number] Date of Birth: 01-29-1951 Date of Admission: 11/17/2018  Referring MD/NP/PA: Dr. Melina Copa Primary Care Physician: [place].  Chief Complaint:  Chief Complaint  Patient presents with  . Emesis    HPI: Debra Hodges is a 67 y.o. 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 Earlie Server presents to the emergency department for evaluation of vomiting.  Patient was in a usual state of health until 3 to 4 days ago when she developed progressively worsening nausea, vomiting, loss of taste, dysphagia, intermittent diarrhea.  Her symptoms seem to be related to her chemotherapy however have been refractory to her Compazine.  She also complains of a painful red rash under her breasts and in her groin.   Patient denies fevers/chills, weakness, dizziness, chest pain, shortness of breath, N/V/C/D, abdominal pain, dysuria/frequency, changes in mental status.    Otherwise there has been no change in status. Patient has been taking medication as prescribed and there has been no recent change in medication or diet.  No recent antibiotics.  There has been no recent illness, hospitalizations, travel or sick contacts.    EMS/ED Course: Patient received Zofran, normal saline. Medical admission has been requested for further management of tractable nausea and vomiting secondary to chemotherapy.  Review of Systems:  CONSTITUTIONAL: No fever/chills, fatigue, weakness, weight gain/loss, headache. EYES: No blurry or double vision. ENT: No tinnitus, postnasal drip, redness or soreness of the oropharynx. RESPIRATORY: No cough, dyspnea, wheeze.  No hemoptysis.  CARDIOVASCULAR: No chest  pain, palpitations, syncope, orthopnea. No lower extremity edema.  GASTROINTESTINAL: Positive dysgeusia. positive nausea, vomiting, abdominal pain, diarrhea, negative constipation.  No hematemesis, melena or hematochezia. GENITOURINARY: No dysuria, frequency, hematuria. ENDOCRINE: No polyuria or nocturia. No heat or cold intolerance. HEMATOLOGY: No anemia, bruising, bleeding. INTEGUMENTARY: No rashes, ulcers, lesions. MUSCULOSKELETAL: No arthritis, gout.Marland Kitchen NEUROLOGIC: No numbness, tingling, ataxia, seizure-type activity, weakness. PSYCHIATRIC: No anxiety, depression, insomnia.   Past Medical History:  Diagnosis Date  . High blood pressure   . Liver lesion 06/26/2016  . Non-small cell carcinoma of lung, stage 4 ([place]) dx'd 10/2013    Past Surgical History:  Procedure Laterality Date  . ABDOMINAL HYSTERECTOMY    . BREAST BIOPSY     Right breast  . VIDEO BRONCHOSCOPY Bilateral 10/27/2013   Procedure: VIDEO BRONCHOSCOPY WITH FLUORO;  Surgeon: Collene Gobble, MD;  Location: [facility];  Service: Cardiopulmonary;  Laterality: Bilateral;     reports that she has never smoked. She has never used smokeless tobacco. She reports that she does not drink alcohol or use drugs.  Allergies  Allergen Reactions  . Peanut-Containing Drug Products     Excessive mucous  . Penicillins     Hives, Childhood Allergy Has patient had a PCN reaction causing immediate rash, facial/tongue/throat swelling, SOB or lightheadedness with hypotension: Yes Has patient had a PCN reaction causing severe rash involving mucus membranes or skin necrosis: No Has patient had a PCN reaction that required hospitalization: No Has patient had a PCN reaction occurring within the last 10 years: No If all of the above answers are "NO", then may proceed with Cephalosporin use.     Family History  Problem Relation Age of Onset  . High blood  pressure Mother   . Cancer Mother        skin  . Cancer Father        esophageal  .  Cancer Maternal Grandfather        breast    Prior to Admission medications   Medication Sig Start Date End Date Taking? Authorizing Provider  acetaminophen (TYLENOL) 325 MG tablet Take 650 mg by mouth every 6 (six) hours as needed for mild pain. Reported on 06/15/2015    [provider]  calcium carbonate (OS-CAL) 600 MG TABS tablet Take 600 mg by mouth daily.     [provider]  CLIMARA 0.1 MG/24HR patch Place 1 patch onto the skin once a week. On Thursday 08/12/13   [provider]  ELIQUIS 5 MG TABS tablet TAKE 1 TABLET(5 MG) BY MOUTH TWICE DAILY 10/16/18   Curt Bears, MD  gabapentin (NEURONTIN) 100 MG capsule TAKE 3 CAPSULES(300 MG) BY MOUTH THREE TIMES DAILY 11/01/18   Curt Bears, MD  levothyroxine (SYNTHROID, LEVOTHROID) 50 MCG tablet Take 50 mcg by mouth at bedtime.  11/13/16   [provider]  loperamide (IMODIUM) 2 MG capsule Take 2 mg by mouth daily as needed for diarrhea or loose stools. Reported on 11/22/2015    [provider]  Multiple Vitamins-Calcium (ONE-A-DAY WOMENS PO) Take 1 tablet by mouth daily.    [provider]  osimertinib mesylate (TAGRISSO) 80 MG tablet Take 2 tablets (160 mg total) by mouth daily. 10/04/18   Curt Bears, MD  oxyCODONE-acetaminophen (PERCOCET/ROXICET) 5-325 MG tablet Take 1 tablet by mouth every 6 (six) hours as needed for severe pain. Patient not taking: Reported on 09/30/2018 05/02/18   Curt Bears, MD  potassium chloride SA (K-DUR,KLOR-CON) 20 MEQ tablet Take 1 tablet (20 mEq total) by mouth at bedtime. 09/11/18   Heilingoetter, Cassandra L, PA-C  prochlorperazine (COMPAZINE) 10 MG tablet TAKE 1 TABLET(10 MG) BY MOUTH EVERY 6 HOURS AS NEEDED FOR NAUSEA OR VOMITING Patient taking differently: Take 10 mg by mouth every 6 (six) hours as needed for nausea or vomiting.  02/11/18   Curt Bears, MD  Sodium Fluoride (CLINPRO 5000) 1.1 % PSTE Place 1 application onto teeth 2 (two) times  daily.     [provider]  spironolactone (ALDACTONE) 25 MG tablet Take 1 tablet (25 mg total) by mouth daily. 09/11/18   Heilingoetter, Cassandra L, PA-C  vitamin C (ASCORBIC ACID) 500 MG tablet Take 500 mg by mouth daily.    [provider]    Physical Exam: Vitals:   11/17/18 2001 11/17/18 2145 11/17/18 2200 11/17/18 2234  BP: (!) 146/68 (!) 130/59 (!) 126/57 121/60  Pulse: (!) 120 88 88 85  Resp: 14 20 20 18   Temp: 98.3 F (36.8 C)     TempSrc: Oral     SpO2: 95% 100% 100% 97%  Weight: 81.6 kg     Height: 5\' 6"  (1.676 m)       GENERAL: 68 y.o.-year-old female patient, well-developed, well-nourished lying in the bed in no acute distress.  Pleasant and cooperative.   HEENT: Head atraumatic, normocephalic. Pupils equal. Mucus membranes moist. NECK: Supple. No JVD. CHEST: Normal breath sounds bilaterally. No wheezing, rales, rhonchi or crackles. No use of accessory muscles of respiration.  No reproducible chest wall tenderness.  CARDIOVASCULAR: S1, S2 normal. No murmurs, rubs, or gallops. Cap refill <2 seconds. Pulses intact distally.  ABDOMEN: Soft, nondistended, nontender. No rebound, guarding, rigidity. Normoactive bowel sounds present in all four  quadrants.  EXTREMITIES: No pedal edema, cyanosis, or clubbing. No calf tenderness or Homan's sign.  NEUROLOGIC: The patient is alert and oriented x 3. Cranial nerves II through XII are grossly intact with no focal sensorimotor deficit. PSYCHIATRIC:  Normal affect, mood, thought content. SKIN: Significant alopecia.  Moist red rash under both breasts, around umbilicus, in groin.     Labs on Admission:  CBC: Recent Labs  Lab 11/17/18 2136  WBC 4.8  HGB 11.5*  HCT 35.0*  MCV 93.3  PLT 96*   Basic Metabolic Panel: Recent Labs  Lab 11/17/18 2136  NA 138  K 3.9  CL 101  CO2 23  GLUCOSE 101*  BUN 25*  CREATININE 0.91  CALCIUM 8.8*   GFR: Estimated Creatinine Clearance: 63.7 mL/min (by C-G formula based  on SCr of 0.91 mg/dL). Liver Function Tests: Recent Labs  Lab 11/17/18 2136  AST 37  ALT 22  ALKPHOS 79  BILITOT 1.5*  PROT 7.2  ALBUMIN 3.4*   Recent Labs  Lab 11/17/18 2136  LIPASE 30   No results for input(s): AMMONIA in the last 168 hours. Coagulation Profile: No results for input(s): INR, PROTIME in the last 168 hours. Cardiac Enzymes: No results for input(s): CKTOTAL, CKMB, CKMBINDEX, TROPONINI in the last 168 hours. BNP (last 3 results) No results for input(s): PROBNP in the last 8760 hours. HbA1C: No results for input(s): HGBA1C in the last 72 hours. CBG: No results for input(s): GLUCAP in the last 168 hours. Lipid Profile: No results for input(s): CHOL, HDL, LDLCALC, TRIG, CHOLHDL, LDLDIRECT in the last 72 hours. Thyroid Function Tests: No results for input(s): TSH, T4TOTAL, FREET4, T3FREE, THYROIDAB in the last 72 hours. Anemia Panel: No results for input(s): VITAMINB12, FOLATE, FERRITIN, TIBC, IRON, RETICCTPCT in the last 72 hours. Urine analysis:    Component Value Date/Time   COLORURINE YELLOW 04/18/2018 [place] 04/18/2018 1438   LABSPEC 1.010 04/18/2018 1438   PHURINE 7.0 04/18/2018 1438   GLUCOSEU NEGATIVE 04/18/2018 1438   HGBUR NEGATIVE 04/18/2018 1438   [place] 04/18/2018 1438   KETONESUR NEGATIVE 04/18/2018 1438   PROTEINUR 30 (A) 10/02/2018 0939   NITRITE NEGATIVE 04/18/2018 1438   LEUKOCYTESUR NEGATIVE 04/18/2018 1438   Sepsis Labs: @LABRCNTIP (procalcitonin:4,lacticidven:4) )No results found for this or any previous visit (from the past 240 hour(s)).   Radiological Exams on Admission: No results found.  EKG: Normal sinus rhythm at 90 bpm with normal axis and nonspecific ST-T wave changes.   Assessment/Plan  This is a 68 y.o. 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  Intractable nausea and vomiting 2/2  chemotherapy -Admit to observation - Antiemetics: compazine,phenergan. -Aspiration precautions -Gentle IV fluid hydration -Consider oncology consult -Consider palliative care consult  # Generalized weakness 2/2 above - PT consult  #. Intertriginous candidiasis - Topical Nystatin to affected areas.  #.  History of non-small cell lung cancer - Continue Tagrisso  #.  Hypertension -Continue spironolactone  #. History of hypothyroidism - Continue Synthroid  #. History of chronic diarrhea - Continue Imodium  #. History of PE - Continue Eliquis  #. History of neuropathy - Continue gabapentin  Admission status: Obs IV Fluids: Normal saline Diet/Nutrition: N.p.o. advance as tolerated to heart healthy Consults called: Consider oncology and palliative DVT Px: Eliquis and early ambulation. Code Status: Full Code  Disposition Plan: To home in less than 1 day  All the records are reviewed and case discussed with ED provider. Management plans discussed with the patient and/or family who express understanding and agree with plan of care.  [employer] D.O. on 11/17/2018 at 10:58 PM CC: Primary care physician; [place].   11/17/2018, 10:58 PM

## 2018-11-17 NOTE — ED Provider Notes (Signed)
[facility] DEPT Provider Note   CSN: [id number] Arrival date & time: 11/17/18  1945     History   Chief Complaint Chief Complaint  Patient presents with  . Emesis    HPI Debra Hodges is a 68 y.o. female.  She has a history of non-small cell lung cancer that is been metastasized to liver and brain.  She is undergoing chemotherapy treatment with Dr. Earlie Server.  She is complaining of 3 to 4 days of nausea associated with vomiting.  She said nothing tastes good and she is having difficulty swallowing any food.  She denies any fever, no chest pain or shortness of breath no abdominal pain.  She is bowels go from loose to normal but that is been away for a while.  No urinary symptoms.  She was trying to hold out until tomorrow to go to the clinic but when she called today they recommended she come in department because her Compazine was not helping her.     The history is provided by the patient.  Emesis Severity:  Moderate Timing:  Intermittent Quality:  Bilious material Progression:  Unchanged Chronicity:  New Relieved by:  Nothing Worsened by:  Nothing Ineffective treatments:  Antiemetics Associated symptoms: no abdominal pain, no chills, no cough, no fever, no headaches and no sore throat     Past Medical History:  Diagnosis Date  . High blood pressure   . Liver lesion 06/26/2016  . Non-small cell carcinoma of lung, stage 4 ([place]) dx'd 10/2013    Patient Active Problem List   Diagnosis Date Noted  . 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 Neutropenia due to and not concurrent with chemotherapy ([place]) 04/20/2018  . Nausea and vomiting 04/18/2018  . Neutropenia ([place]) 04/18/2018  . Acute pulmonary embolism ([place]) 03/07/2018  . Hypothyroidism 03/07/2018  . Non-small cell lung cancer with metastasis ([place])   . Goals of care, counseling/discussion 02/11/2018  . Encounter for antineoplastic immunotherapy 02/11/2018  . Metastases to the liver  ([place]) 01/24/2017  . Liver lesion 06/26/2016  . Non-small cell carcinoma of lung, stage 4 ([place])   . Vaginal lesion 01/06/2015  . Encounter for antineoplastic chemotherapy 01/06/2015  . Malignant neoplasm of upper lobe of left lung ([place]) 11/13/2013  . Lung mass 10/16/2013    Past Surgical History:  Procedure Laterality Date  . ABDOMINAL HYSTERECTOMY    . BREAST BIOPSY     Right breast  . VIDEO BRONCHOSCOPY Bilateral 10/27/2013   Procedure: VIDEO BRONCHOSCOPY WITH FLUORO;  Surgeon: Collene Gobble, MD;  Location: [facility];  Service: Cardiopulmonary;  Laterality: Bilateral;     OB History   No obstetric history on file.      Home Medications    Prior to Admission medications   Medication Sig Start Date End Date Taking? Authorizing Provider  acetaminophen (TYLENOL) 325 MG tablet Take 650 mg by mouth every 6 (six) hours as needed for mild pain. Reported on 06/15/2015    [provider]  calcium carbonate (OS-CAL) 600 MG TABS tablet Take 600 mg by mouth daily.     [provider]  CLIMARA 0.1 MG/24HR patch Place 1 patch onto the skin once a week. On Thursday 08/12/13   [provider]  ELIQUIS 5 MG TABS tablet TAKE 1 TABLET(5 MG) BY MOUTH TWICE DAILY 10/16/18   Curt Bears, MD  gabapentin (NEURONTIN) 100 MG capsule TAKE 3 CAPSULES(300 MG) BY MOUTH THREE TIMES DAILY 11/01/18   Curt Bears, MD  levothyroxine (SYNTHROID,  LEVOTHROID) 50 MCG tablet Take 50 mcg by mouth at bedtime.  11/13/16   [provider]  loperamide (IMODIUM) 2 MG capsule Take 2 mg by mouth daily as needed for diarrhea or loose stools. Reported on 11/22/2015    [provider]  Multiple Vitamins-Calcium (ONE-A-DAY WOMENS PO) Take 1 tablet by mouth daily.    [provider]  osimertinib mesylate (TAGRISSO) 80 MG tablet Take 2 tablets (160 mg total) by mouth daily. 10/04/18   Curt Bears, MD  oxyCODONE-acetaminophen (PERCOCET/ROXICET) 5-325 MG tablet Take 1  tablet by mouth every 6 (six) hours as needed for severe pain. Patient not taking: Reported on 09/30/2018 05/02/18   Curt Bears, MD  potassium chloride SA (K-DUR,KLOR-CON) 20 MEQ tablet Take 1 tablet (20 mEq total) by mouth at bedtime. 09/11/18   Heilingoetter, Cassandra L, PA-C  prochlorperazine (COMPAZINE) 10 MG tablet TAKE 1 TABLET(10 MG) BY MOUTH EVERY 6 HOURS AS NEEDED FOR NAUSEA OR VOMITING Patient taking differently: Take 10 mg by mouth every 6 (six) hours as needed for nausea or vomiting.  02/11/18   Curt Bears, MD  Sodium Fluoride (CLINPRO 5000) 1.1 % PSTE Place 1 application onto teeth 2 (two) times daily.     [provider]  spironolactone (ALDACTONE) 25 MG tablet Take 1 tablet (25 mg total) by mouth daily. 09/11/18   Heilingoetter, Cassandra L, PA-C  vitamin C (ASCORBIC ACID) 500 MG tablet Take 500 mg by mouth daily.    [provider]    Family History Family History  Problem Relation Age of Onset  . High blood pressure Mother   . Cancer Mother        skin  . Cancer Father        esophageal  . Cancer Maternal Grandfather        breast    Social History Social History   Tobacco Use  . Smoking status: Never Smoker  . Smokeless tobacco: Never Used  Substance Use Topics  . Alcohol use: No    Comment: social  . Drug use: No     Allergies   Peanut-containing drug products and Penicillins   Review of Systems Review of Systems  Constitutional: Negative for chills and fever.  HENT: Negative for sore throat.   Eyes: Negative for visual disturbance.  Respiratory: Negative for cough and shortness of breath.   Cardiovascular: Negative for chest pain.  Gastrointestinal: Positive for nausea and vomiting. Negative for abdominal pain.  Genitourinary: Negative for dysuria.  Musculoskeletal: Negative for back pain.  Skin: Negative for rash.  Neurological: Negative for headaches.     Physical Exam Updated Vital Signs BP (!) 146/68 (BP  Location: Right Arm)   Pulse (!) 120   Temp 98.3 F (36.8 C) (Oral)   Resp 14   Ht 5\' 6"  (1.676 m)   Wt 81.6 kg   SpO2 95%   BMI 29.05 kg/m   Physical Exam Vitals signs and nursing note reviewed.  Constitutional:      General: She is not in acute distress.    Appearance: She is well-developed.  HENT:     Head: Normocephalic and atraumatic.     Comments: Alopecia on head. Eyes:     Conjunctiva/sclera: Conjunctivae normal.  Neck:     Musculoskeletal: Neck supple.  Cardiovascular:     Rate and Rhythm: Regular rhythm. Tachycardia present.     Heart sounds: No murmur.  Pulmonary:     Effort: Pulmonary effort is normal. No respiratory distress.  Breath sounds: Normal breath sounds.  Abdominal:     Palpations: Abdomen is soft.     Tenderness: There is no abdominal tenderness. There is no guarding or rebound.  Musculoskeletal: Normal range of motion.        General: No signs of injury.     Right lower leg: No edema.     Left lower leg: No edema.  Skin:    General: Skin is warm and dry.     Capillary Refill: Capillary refill takes less than 2 seconds.  Neurological:     General: No focal deficit present.     Mental Status: She is alert and oriented to person, place, and time.      ED Treatments / Results  Labs (all labs ordered are listed, but only abnormal results are displayed) Labs Reviewed  COMPREHENSIVE METABOLIC PANEL - Abnormal; Notable for the following components:      Result Value   Glucose, Bld 101 (*)    BUN 25 (*)    Calcium 8.8 (*)    Albumin 3.4 (*)    Total Bilirubin 1.5 (*)    All other components within normal limits  CBC - Abnormal; Notable for the following components:   RBC 3.75 (*)    Hemoglobin 11.5 (*)    HCT 35.0 (*)    Platelets 96 (*)    All other components within normal limits  URINALYSIS, ROUTINE W REFLEX MICROSCOPIC - Abnormal; Notable for the following components:   Hgb urine dipstick SMALL (*)    Ketones, ur 80 (*)     Leukocytes,Ua TRACE (*)    Bacteria, UA RARE (*)    All other components within normal limits  BASIC METABOLIC PANEL - Abnormal; Notable for the following components:   Calcium 8.3 (*)    All other components within normal limits  CBC - Abnormal; Notable for the following components:   RBC 3.60 (*)    Hemoglobin 10.8 (*)    HCT 34.8 (*)    Platelets 85 (*)    All other components within normal limits  SARS CORONAVIRUS 2 (HOSPITAL ORDER, [place] LAB)  LIPASE, BLOOD  T4, FREE    EKG EKG Interpretation  Date/Time:  Sunday November 17 2018 21:40:55 EDT Ventricular Rate:  90 PR Interval:    QRS Duration: 90 QT Interval:  394 QTC Calculation: 483 R Axis:   -22 Text Interpretation:  sinus Abnormal R-wave progression, early transition Left ventricular hypertrophy Anterior Q waves, possibly due to LVH slower rate since prior 11/19 Confirmed by Aletta Edouard [phone]) on 11/17/2018 9:49:56 PM   Radiology No results found.  Procedures Procedures (including critical care time)  Medications Ordered in ED Medications  sodium chloride flush (NS) 0.9 % injection 3 mL (has no administration in time range)  sodium chloride 0.9 % bolus 1,000 mL (has no administration in time range)  ondansetron (ZOFRAN) injection 4 mg (has no administration in time range)     Initial Impression / Assessment and Plan / ED Course  I have reviewed the triage vital signs and the nursing notes.  Pertinent labs & imaging results that were available during my care of the patient were reviewed by me and considered in my medical decision making (see chart for details).  Clinical Course as of Nov 18 839  Sun Nov 17, 4615  550 68 year old female on oral chemo here with decreased appetite nausea and vomiting.  She is afebrile here but tachycardic.  Abdomen is  soft nontender.  She is getting some IV fluids and nausea medication and screening labs.   [MB]  2251 Discussed with Dr. Ara Kussmaul  from the hospitalist service who will evaluate the patient for admission.  Patient states she felt a little bit better after the IV fluids but she does not feel comfortable going home and still feels fairly weak.  Labs do not show any significant metabolic derangement.  Her Covid testing is still pending.   [MB]    Clinical Course User Index [MB] Hayden Rasmussen, MD       Final Clinical Impressions(s) / ED Diagnoses   Final diagnoses:  Primary malignant neoplasm of lung metastatic to other site, unspecified laterality ([place])  Nausea and vomiting, intractability of vomiting not specified, unspecified vomiting type  Weakness    ED Discharge Orders    None       Hayden Rasmussen, MD 11/18/18 [phone]

## 2018-11-17 NOTE — ED Triage Notes (Signed)
Pt reports having vomiting and dizziness and is under chemo for lung cancer. Pt states that vomiting has been ongoing for 3 days.

## 2018-11-18 ENCOUNTER — Observation Stay (HOSPITAL_COMMUNITY): Payer: Medicare Other

## 2018-11-18 DIAGNOSIS — B372 Candidiasis of skin and nail: Secondary | ICD-10-CM | POA: Diagnosis present

## 2018-11-18 DIAGNOSIS — Z79899 Other long term (current) drug therapy: Secondary | ICD-10-CM | POA: Diagnosis not present

## 2018-11-18 DIAGNOSIS — Z803 Family history of malignant neoplasm of breast: Secondary | ICD-10-CM | POA: Diagnosis not present

## 2018-11-18 DIAGNOSIS — C3490 Malignant neoplasm of unspecified part of unspecified bronchus or lung: Secondary | ICD-10-CM | POA: Diagnosis not present

## 2018-11-18 DIAGNOSIS — B370 Candidal stomatitis: Secondary | ICD-10-CM | POA: Diagnosis present

## 2018-11-18 DIAGNOSIS — Z91010 Allergy to peanuts: Secondary | ICD-10-CM | POA: Diagnosis not present

## 2018-11-18 DIAGNOSIS — Z7901 Long term (current) use of anticoagulants: Secondary | ICD-10-CM | POA: Diagnosis not present

## 2018-11-18 DIAGNOSIS — R531 Weakness: Secondary | ICD-10-CM | POA: Diagnosis not present

## 2018-11-18 DIAGNOSIS — C7949 Secondary malignant neoplasm of other parts of nervous system: Secondary | ICD-10-CM

## 2018-11-18 DIAGNOSIS — C7931 Secondary malignant neoplasm of brain: Secondary | ICD-10-CM | POA: Diagnosis present

## 2018-11-18 DIAGNOSIS — Z79890 Hormone replacement therapy (postmenopausal): Secondary | ICD-10-CM | POA: Diagnosis not present

## 2018-11-18 DIAGNOSIS — Z880 Allergy status to penicillin: Secondary | ICD-10-CM | POA: Diagnosis not present

## 2018-11-18 DIAGNOSIS — R112 Nausea with vomiting, unspecified: Secondary | ICD-10-CM | POA: Diagnosis present

## 2018-11-18 DIAGNOSIS — Z79891 Long term (current) use of opiate analgesic: Secondary | ICD-10-CM | POA: Diagnosis not present

## 2018-11-18 DIAGNOSIS — I10 Essential (primary) hypertension: Secondary | ICD-10-CM | POA: Diagnosis present

## 2018-11-18 DIAGNOSIS — D61810 Antineoplastic chemotherapy induced pancytopenia: Secondary | ICD-10-CM | POA: Diagnosis present

## 2018-11-18 DIAGNOSIS — C3412 Malignant neoplasm of upper lobe, left bronchus or lung: Secondary | ICD-10-CM | POA: Diagnosis present

## 2018-11-18 DIAGNOSIS — Z808 Family history of malignant neoplasm of other organs or systems: Secondary | ICD-10-CM | POA: Diagnosis not present

## 2018-11-18 DIAGNOSIS — T451X5A Adverse effect of antineoplastic and immunosuppressive drugs, initial encounter: Secondary | ICD-10-CM | POA: Diagnosis present

## 2018-11-18 DIAGNOSIS — Z8249 Family history of ischemic heart disease and other diseases of the circulatory system: Secondary | ICD-10-CM | POA: Diagnosis not present

## 2018-11-18 DIAGNOSIS — Z86711 Personal history of pulmonary embolism: Secondary | ICD-10-CM | POA: Diagnosis not present

## 2018-11-18 DIAGNOSIS — Y929 Unspecified place or not applicable: Secondary | ICD-10-CM | POA: Diagnosis not present

## 2018-11-18 DIAGNOSIS — C787 Secondary malignant neoplasm of liver and intrahepatic bile duct: Secondary | ICD-10-CM | POA: Diagnosis present

## 2018-11-18 DIAGNOSIS — G629 Polyneuropathy, unspecified: Secondary | ICD-10-CM | POA: Diagnosis present

## 2018-11-18 DIAGNOSIS — Z1159 Encounter for screening for other viral diseases: Secondary | ICD-10-CM | POA: Diagnosis not present

## 2018-11-18 DIAGNOSIS — Z800 Family history of malignant neoplasm of digestive organs: Secondary | ICD-10-CM | POA: Diagnosis not present

## 2018-11-18 DIAGNOSIS — Z90710 Acquired absence of both cervix and uterus: Secondary | ICD-10-CM | POA: Diagnosis not present

## 2018-11-18 DIAGNOSIS — E039 Hypothyroidism, unspecified: Secondary | ICD-10-CM | POA: Diagnosis present

## 2018-11-18 LAB — URINALYSIS, ROUTINE W REFLEX MICROSCOPIC
Bilirubin Urine: NEGATIVE
Glucose, UA: NEGATIVE mg/dL
Ketones, ur: 80 mg/dL — AB
Nitrite: NEGATIVE
Protein, ur: NEGATIVE mg/dL
Specific Gravity, Urine: 1.023 (ref 1.005–1.030)
pH: 5 (ref 5.0–8.0)

## 2018-11-18 LAB — BASIC METABOLIC PANEL
Anion gap: 13 (ref 5–15)
BUN: 19 mg/dL (ref 8–23)
CO2: 23 mmol/L (ref 22–32)
Calcium: 8.3 mg/dL — ABNORMAL LOW (ref 8.9–10.3)
Chloride: 104 mmol/L (ref 98–111)
Creatinine, Ser: 0.59 mg/dL (ref 0.44–1.00)
GFR calc Af Amer: >60 mL/min (ref >60)
GFR calc non Af Amer: >60 mL/min (ref >60)
Glucose, Bld: 83 mg/dL (ref 70–99)
Potassium: 3.8 mmol/L (ref 3.5–5.1)
Sodium: 140 mmol/L (ref 135–145)

## 2018-11-18 LAB — CBC
HCT: 34.8 % — ABNORMAL LOW (ref 36.0–46.0)
Hemoglobin: 10.8 g/dL — ABNORMAL LOW (ref 12.0–15.0)
MCH: 30 pg (ref 26.0–34.0)
MCHC: 31 g/dL (ref 30.0–36.0)
MCV: 96.7 fL (ref 80.0–100.0)
Platelets: 85 10*3/uL — ABNORMAL LOW (ref 150–400)
RBC: 3.6 MIL/uL — ABNORMAL LOW (ref 3.87–5.11)
RDW: 15.2 % (ref 11.5–15.5)
WBC: 4.5 10*3/uL (ref 4.0–10.5)
nRBC: 0 % (ref 0.0–0.2)

## 2018-11-18 LAB — T4, FREE: Free T4: 2.12 ng/dL — ABNORMAL HIGH (ref 0.61–1.12)

## 2018-11-18 MED ORDER — APIXABAN 5 MG PO TABS
5.0000 mg | ORAL_TABLET | Freq: Two times a day (BID) | ORAL | Status: DC
  Administered 2018-11-18 – 2018-11-20 (×5): 5 mg via ORAL
  Filled 2018-11-18 (×5): qty 1

## 2018-11-18 MED ORDER — OXYCODONE HCL 5 MG PO TABS: 5.0000 mg | ORAL_TABLET | ORAL | Status: DC | PRN

## 2018-11-18 MED ORDER — BISACODYL 5 MG PO TBEC: 5.0000 mg | DELAYED_RELEASE_TABLET | Freq: Every day | ORAL | Status: DC | PRN

## 2018-11-18 MED ORDER — PROMETHAZINE HCL 25 MG/ML IJ SOLN
12.5000 mg | Freq: Four times a day (QID) | INTRAMUSCULAR | Status: DC | PRN
  Administered 2018-11-18: 12.5 mg via INTRAVENOUS
  Filled 2018-11-18: qty 1

## 2018-11-18 MED ORDER — ALBUTEROL SULFATE (2.5 MG/3ML) 0.083% IN NEBU: 2.5000 mg | INHALATION_SOLUTION | Freq: Four times a day (QID) | RESPIRATORY_TRACT | Status: DC | PRN

## 2018-11-18 MED ORDER — OSIMERTINIB MESYLATE 80 MG PO TABS
160.0000 mg | ORAL_TABLET | Freq: Every day | ORAL | Status: DC
  Administered 2018-11-18 – 2018-11-19 (×2): 160 mg via ORAL

## 2018-11-18 MED ORDER — CALCIUM CARBONATE 600 MG PO TABS: 600.0000 mg | ORAL_TABLET | Freq: Every day | ORAL | Status: DC

## 2018-11-18 MED ORDER — SODIUM CHLORIDE 0.9 % IV SOLN
INTRAVENOUS | Status: DC
  Administered 2018-11-18: 02:00:00 via INTRAVENOUS

## 2018-11-18 MED ORDER — PROCHLORPERAZINE EDISYLATE 10 MG/2ML IJ SOLN: 10.0000 mg | Freq: Four times a day (QID) | INTRAMUSCULAR | Status: DC | PRN

## 2018-11-18 MED ORDER — ACETAMINOPHEN 325 MG PO TABS: 650.0000 mg | ORAL_TABLET | Freq: Four times a day (QID) | ORAL | Status: DC | PRN

## 2018-11-18 MED ORDER — SENNOSIDES-DOCUSATE SODIUM 8.6-50 MG PO TABS: 1.0000 | ORAL_TABLET | Freq: Every evening | ORAL | Status: DC | PRN

## 2018-11-18 MED ORDER — SPIRONOLACTONE 25 MG PO TABS
25.0000 mg | ORAL_TABLET | Freq: Every day | ORAL | Status: DC
  Administered 2018-11-18 – 2018-11-20 (×3): 25 mg via ORAL
  Filled 2018-11-18 (×3): qty 1

## 2018-11-18 MED ORDER — POTASSIUM CHLORIDE CRYS ER 20 MEQ PO TBCR
20.0000 meq | EXTENDED_RELEASE_TABLET | Freq: Every day | ORAL | Status: DC
  Administered 2018-11-18: 20 meq via ORAL
  Filled 2018-11-18: qty 1

## 2018-11-18 MED ORDER — ONDANSETRON HCL 4 MG PO TABS
4.0000 mg | ORAL_TABLET | Freq: Four times a day (QID) | ORAL | Status: DC | PRN
  Administered 2018-11-19: 4 mg via ORAL
  Filled 2018-11-18: qty 1

## 2018-11-18 MED ORDER — FLUCONAZOLE 100 MG PO TABS
100.0000 mg | ORAL_TABLET | Freq: Every day | ORAL | Status: DC
  Administered 2018-11-19 – 2018-11-20 (×2): 100 mg via ORAL
  Filled 2018-11-18 (×4): qty 1

## 2018-11-18 MED ORDER — NYSTATIN 100000 UNIT/GM EX POWD
Freq: Three times a day (TID) | CUTANEOUS | Status: DC
  Administered 2018-11-18 – 2018-11-20 (×6): via TOPICAL
  Filled 2018-11-18: qty 15

## 2018-11-18 MED ORDER — GADOBUTROL 1 MMOL/ML IV SOLN
7.0000 mL | Freq: Once | INTRAVENOUS | Status: AC | PRN
  Administered 2018-11-18: 7 mL via INTRAVENOUS

## 2018-11-18 MED ORDER — ONDANSETRON HCL 4 MG/2ML IJ SOLN: 4.0000 mg | Freq: Four times a day (QID) | INTRAMUSCULAR | Status: DC | PRN

## 2018-11-18 MED ORDER — GABAPENTIN 300 MG PO CAPS
300.0000 mg | ORAL_CAPSULE | Freq: Three times a day (TID) | ORAL | Status: DC
  Administered 2018-11-18 – 2018-11-20 (×6): 300 mg via ORAL
  Filled 2018-11-18 (×6): qty 1

## 2018-11-18 MED ORDER — CALCIUM CARBONATE 1250 (500 CA) MG PO TABS
1.0000 | ORAL_TABLET | Freq: Every day | ORAL | Status: DC
  Administered 2018-11-18 – 2018-11-20 (×3): 500 mg via ORAL
  Filled 2018-11-18 (×3): qty 1

## 2018-11-18 MED ORDER — MAGNESIUM CITRATE PO SOLN: 1.0000 | Freq: Once | ORAL | Status: DC | PRN

## 2018-11-18 MED ORDER — SODIUM FLUORIDE 1.1 % DT PSTE: 1.0000 "application " | PASTE | Freq: Two times a day (BID) | DENTAL | Status: DC

## 2018-11-18 MED ORDER — LOPERAMIDE HCL 2 MG PO CAPS
2.0000 mg | ORAL_CAPSULE | Freq: Every day | ORAL | Status: DC | PRN
  Administered 2018-11-19: 2 mg via ORAL
  Filled 2018-11-18: qty 1

## 2018-11-18 MED ORDER — IPRATROPIUM BROMIDE 0.02 % IN SOLN: 0.5000 mg | Freq: Four times a day (QID) | RESPIRATORY_TRACT | Status: DC | PRN

## 2018-11-18 MED ORDER — LEVOTHYROXINE SODIUM 50 MCG PO TABS
50.0000 ug | ORAL_TABLET | Freq: Every day | ORAL | Status: DC
  Administered 2018-11-18: 50 ug via ORAL
  Filled 2018-11-18: qty 1

## 2018-11-18 MED ORDER — VITAMIN C 500 MG PO TABS
500.0000 mg | ORAL_TABLET | Freq: Every day | ORAL | Status: DC
  Administered 2018-11-18 – 2018-11-20 (×3): 500 mg via ORAL
  Filled 2018-11-18 (×3): qty 1

## 2018-11-18 MED ORDER — ACETAMINOPHEN 650 MG RE SUPP: 650.0000 mg | Freq: Four times a day (QID) | RECTAL | Status: DC | PRN

## 2018-11-18 NOTE — Progress Notes (Signed)
PROGRESS NOTE    Debra Hodges  MPN:[id number] DOB: 1950-10-31 DOA: 11/17/2018 PCP: [facility].     Brief Narrative:  Debra Hodges is a 68 y.o. 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 Earlie Server presents to the emergency department for evaluation of vomiting.  Patient was in a usual state of health until 3 to 4 days ago when she developed progressively worsening nausea, vomiting, loss of taste, dysphagia, intermittent diarrhea.  Her symptoms seem to be related to her chemotherapy however have been refractory to her Compazine.  New events last 24 hours / Subjective: Feeling better since admission, but not quite back to baseline.  Has only trialed clear liquids so far.  Assessment & Plan:   Active Problems:   Intractable vomiting with nausea   Primary malignant neoplasm of lung metastatic to other site (HCC)  Nausea and vomiting secondary to chemotherapy -Slowly advance diet to full liquid diet -Appreciate oncology consultation -Continue IV fluids until p.o. intake adequate  Generalized weakness -PT consult  Intertriginous candidiasis -Topical nystatin -Started on fluconazole  Stage IV non-small cell lung cancer -Follows with Dr. Julien Nordmann -Continue Tagrisso -MRI brain ordered by oncology team  Essential hypertension -Continue spironolactone  Hypothyroidism -TSH low <0.08, free T4 elevated 2.12.  Stop Synthroid.  Repeat lab work as an outpatient and resume at a lower dose as indicated  History of PE -Continue Eliquis  Peripheral neuropathy -Continue gabapentin    DVT prophylaxis: Eliquis Code Status: Full code Family Communication: None Disposition Plan: Improvement in p.o. intake.  Slowly advance diet today, continue IV fluids.  Patient not back to baseline yet.  Change status to inpatient as patient  requires continued monitoring and treatment for her intractable nausea and vomiting.   Consultants:   Oncology  Procedures:   None  Antimicrobials:  Anti-infectives (From admission, onward)   Start     Dose/Rate Route Frequency Ordered Stop   11/18/18 1100  fluconazole (DIFLUCAN) tablet 100 mg     100 mg Oral Daily 11/18/18 0929 11/25/18 0959        Objective: Vitals:   11/18/18 0030 11/18/18 0150 11/18/18 0227 11/18/18 0516  BP: 121/61  138/69 121/62  Pulse: 85  86 88  Resp: 20  15 16   Temp:   98.1 F (36.7 C) 98.3 F (36.8 C)  TempSrc:   Oral Oral  SpO2: 99%  100% 100%  Weight:  75.7 kg    Height:        Intake/Output Summary (Last 24 hours) at 11/18/2018 1315 Last data filed at 11/18/2018 1000 Gross per 24 hour  Intake 1675.07 ml  Output 250 ml  Net 1425.07 ml   Filed Weights   11/17/18 2001 11/18/18 0150  Weight: 81.6 kg 75.7 kg    Examination:  General exam: Appears calm and comfortable  Respiratory system: Clear to auscultation. Respiratory effort normal. Cardiovascular system: S1 & S2 heard, RRR. No JVD, murmurs, rubs, gallops or clicks. No pedal edema. Gastrointestinal system: Abdomen is nondistended, soft and nontender. No organomegaly or masses felt. Normal bowel sounds heard. Central nervous system: Alert and oriented. No focal neurological deficits. Extremities: Symmetric 5 x 5 power. Skin: No rashes, lesions or ulcers Psychiatry: Judgement and insight appear normal. Mood & affect appropriate.   Data Reviewed: I have personally reviewed following labs and imaging studies  CBC: Recent Labs  Lab  11/17/18 2136 11/18/18 0324  WBC 4.8 4.5  HGB 11.5* 10.8*  HCT 35.0* 34.8*  MCV 93.3 96.7  PLT 96* 85*   Basic Metabolic Panel: Recent Labs  Lab 11/17/18 2136 11/18/18 0324  NA 138 140  K 3.9 3.8  CL 101 104  CO2 23 23  GLUCOSE 101* 83  BUN 25* 19  CREATININE 0.91 0.59  CALCIUM 8.8* 8.3*   GFR: Estimated Creatinine Clearance: 70  mL/min (by C-G formula based on SCr of 0.59 mg/dL). Liver Function Tests: Recent Labs  Lab 11/17/18 2136  AST 37  ALT 22  ALKPHOS 79  BILITOT 1.5*  PROT 7.2  ALBUMIN 3.4*   Recent Labs  Lab 11/17/18 2136  LIPASE 30   No results for input(s): AMMONIA in the last 168 hours. Coagulation Profile: No results for input(s): INR, PROTIME in the last 168 hours. Cardiac Enzymes: No results for input(s): CKTOTAL, CKMB, CKMBINDEX, TROPONINI in the last 168 hours. BNP (last 3 results) No results for input(s): PROBNP in the last 8760 hours. HbA1C: No results for input(s): HGBA1C in the last 72 hours. CBG: No results for input(s): GLUCAP in the last 168 hours. Lipid Profile: No results for input(s): CHOL, HDL, LDLCALC, TRIG, CHOLHDL, LDLDIRECT in the last 72 hours. Thyroid Function Tests: Recent Labs    11/18/18 0820  FREET4 2.12*   Anemia Panel: No results for input(s): VITAMINB12, FOLATE, FERRITIN, TIBC, IRON, RETICCTPCT in the last 72 hours. Sepsis Labs: No results for input(s): PROCALCITON, LATICACIDVEN in the last 168 hours.  Recent Results (from the past 240 hour(s))  SARS Coronavirus 2 (CEPHEID - Performed in [place] hospital lab), Hosp Order     Status: None   Collection Time: 11/17/18  9:31 PM   Specimen: Nasopharyngeal Swab  Result Value Ref Range Status   SARS Coronavirus 2 NEGATIVE NEGATIVE Final    Comment: (NOTE) If result is NEGATIVE SARS-CoV-2 target nucleic acids are NOT DETECTED. The SARS-CoV-2 RNA is generally detectable in upper and lower  respiratory specimens during the acute phase of infection. The lowest  concentration of SARS-CoV-2 viral copies this assay can detect is 250  copies / mL. A negative result does not preclude SARS-CoV-2 infection  and should not be used as the sole basis for treatment or other  patient management decisions.  A negative result may occur with  improper specimen collection / handling, submission of specimen other  than  nasopharyngeal swab, presence of viral mutation(s) within the  areas targeted by this assay, and inadequate number of viral copies  (<250 copies / mL). A negative result must be combined with clinical  observations, patient history, and epidemiological information. If result is POSITIVE SARS-CoV-2 target nucleic acids are DETECTED. The SARS-CoV-2 RNA is generally detectable in upper and lower  respiratory specimens dur ing the acute phase of infection.  Positive  results are indicative of active infection with SARS-CoV-2.  Clinical  correlation with patient history and other diagnostic information is  necessary to determine patient infection status.  Positive results do  not rule out bacterial infection or co-infection with other viruses. If result is PRESUMPTIVE POSTIVE SARS-CoV-2 nucleic acids MAY BE PRESENT.   A presumptive positive result was obtained on the submitted specimen  and confirmed on repeat testing.  While 2019 novel coronavirus  (SARS-CoV-2) nucleic acids may be present in the submitted sample  additional confirmatory testing may be necessary for epidemiological  and / or clinical management purposes  to differentiate between  SARS-CoV-2 and  other Sarbecovirus currently known to infect humans.  If clinically indicated additional testing with an alternate test  methodology [phone]) is advised. The SARS-CoV-2 RNA is generally  detectable in upper and lower respiratory sp ecimens during the acute  phase of infection. The expected result is Negative. Fact Sheet for Patients:  StrictlyIdeas.no Fact Sheet for Healthcare Providers: BankingDealers.co.za This test is not yet approved or cleared by the [place] FDA and has been authorized for detection and/or diagnosis of SARS-CoV-2 by FDA under an Emergency Use Authorization (EUA).  This EUA will remain in effect (meaning this test can be used) for the duration of the  COVID-19 declaration under Section 564(b)(1) of the Act, 21 U.S.C. section 360bbb-3(b)(1), unless the authorization is terminated or revoked sooner. Performed at [facility], [place] [address] [place], [place] [address]        Radiology Studies: No results found.    Scheduled Meds: . apixaban  5 mg Oral BID  . calcium carbonate  1 tablet Oral Q breakfast  . fluconazole  100 mg Oral Daily  . gabapentin  300 mg Oral TID  . nystatin   Topical TID  . osimertinib mesylate  160 mg Oral Daily  . potassium chloride SA  20 mEq Oral QHS  . sodium chloride flush  3 mL Intravenous Once  . spironolactone  25 mg Oral Daily  . vitamin C  500 mg Oral Daily   Continuous Infusions: . sodium chloride 75 mL/hr at 11/18/18 0619     LOS: 0 days    Time spent: 35 minutes   Dessa Phi, DO [facility] [email or web address] 11/18/2018, 1:15 PM

## 2018-11-18 NOTE — Evaluation (Signed)
Physical Therapy Evaluation Patient Details Name: Debra Hodges MRN: [id number] DOB: [birth year] Today's Date: 11/18/2018   History of Present Illness  68 yo female admitted with intractable vomitng and nausea 2* chemo. Hx of PE, NSCLC with mets, neuropathy.  Clinical Impression  On eval, pt required Min assist for mobility. She was able to take a few steps in the room with a RW. Pt presents with general weakness, decreased activity tolerance, and impaired gait and balance. She fatigues easily with minimal activity. Will follow and progress activity as tolerated.     Follow Up Recommendations Home health PT;Supervision/Assistance - 24 hour    Equipment Recommendations  None recommended by PT    Recommendations for Other Services OT consult     Precautions / Restrictions Precautions Precautions: Fall Restrictions Weight Bearing Restrictions: No      Mobility  Bed Mobility Overal bed mobility: Needs Assistance Bed Mobility: Sit to Supine;Sidelying to Sit   Sidelying to sit: Min assist   Sit to supine: Min assist   General bed mobility comments: Pt c/o painful bottom 2* pressure sores. Encouraged pt to logroll/sidelying>sit. Cues for technique.  Transfers Overall transfer level: Needs assistance Equipment used: Rolling walker (2 wheeled) Transfers: Sit to/from Stand Sit to Stand: Min assist;From elevated surface         General transfer comment: Assist to rise, stabilize, control descent. VCs safety, technique, hand placement.  Ambulation/Gait Ambulation/Gait assistance: Min assist Gait Distance (Feet): 3 Feet Assistive device: Rolling walker (2 wheeled) Gait Pattern/deviations: Step-through pattern     General Gait Details: Pt took a few steps forwards then backwards. Fatigues easily. Assist to stabilize and manage RW.  Stairs            Wheelchair Mobility    Modified Rankin (Stroke Patients Only)       Balance Overall balance assessment:  History of Falls;Needs assistance         Standing balance support: Bilateral upper extremity supported Standing balance-Leahy Scale: Poor                               Pertinent Vitals/Pain Pain Assessment: No/denies pain    Home Living Family/patient expects to be discharged to:: Private residence Living Arrangements: Spouse/significant other Available Help at Discharge: Family Type of Home: House Home Access: Ramped entrance     Home Layout: One level Home Equipment: Environmental consultant - 2 wheels;Wheelchair - manual      Prior Function Level of Independence: Needs assistance               Hand Dominance        Extremity/Trunk Assessment   Upper Extremity Assessment Upper Extremity Assessment: Generalized weakness    Lower Extremity Assessment Lower Extremity Assessment: Generalized weakness    Cervical / Trunk Assessment Cervical / Trunk Assessment: Normal  Communication   Communication: No difficulties  Cognition Arousal/Alertness: Awake/alert Behavior During Therapy: WFL for tasks assessed/performed Overall Cognitive Status: Within Functional Limits for tasks assessed                                        General Comments      Exercises     Assessment/Plan    PT Assessment Patient needs continued PT services  PT Problem List Decreased strength;Decreased mobility;Decreased activity tolerance;Decreased balance;Decreased knowledge of use of DME;Pain  PT Treatment Interventions DME instruction;Gait training;Therapeutic exercise;Therapeutic activities;Patient/family education;Balance training;Functional mobility training    PT Goals (Current goals can be found in the Care Plan section)  Acute Rehab PT Goals Patient Stated Goal: to get stronger PT Goal Formulation: With patient Time For Goal Achievement: 12/02/18 Potential to Achieve Goals: Fair    Frequency Min 3X/week   Barriers to discharge         Co-evaluation               AM-PAC PT "6 Clicks" Mobility  Outcome Measure Help needed turning from your back to your side while in a flat bed without using bedrails?: A Little Help needed moving from lying on your back to sitting on the side of a flat bed without using bedrails?: A Little Help needed moving to and from a bed to a chair (including a wheelchair)?: A Little Help needed standing up from a chair using your arms (e.g., wheelchair or bedside chair)?: A Little Help needed to walk in hospital room?: A Lot Help needed climbing 3-5 steps with a railing? : A Lot 6 Click Score: 16    End of Session Equipment Utilized During Treatment: Gait belt Activity Tolerance: Patient limited by fatigue Patient left: in bed;with bed alarm set;with call bell/phone within reach   PT Visit Diagnosis: Muscle weakness (generalized) (M62.81);History of falling (Z91.81);Difficulty in walking, not elsewhere classified (R26.2)    Time: 1287-8676 PT Time Calculation (min) (ACUTE ONLY): 13 min   Charges:   PT Evaluation $PT Eval Moderate Complexity: Oval, PT Acute Rehabilitation Services Pager: [phone] Office: [phone]

## 2018-11-18 NOTE — Progress Notes (Signed)
PT Cancellation Note  Patient Details Name: Debra Hodges MRN: [id number] DOB: 11/12/1950   Cancelled Treatment:    Reason Eval/Treat Not Completed: Patient at procedure or test/unavailable   Weston Anna, PT Acute Rehabilitation Services Pager: [phone] Office: [phone]

## 2018-11-18 NOTE — ED Notes (Signed)
[facility] hospice called related to referral that may have been sent to them in error. Belinda directed/given contact information to pt primary nurse on 5W [phone] regarding pt information/care. Pt is currently admitted to 5W.  2876 11/18/18 M. Arieliz Latino Therapist, sports

## 2018-11-18 NOTE — ED Notes (Signed)
Dr. Ara Kussmaul is wanting to see pt before she goes up to 534.

## 2018-11-18 NOTE — ED Notes (Signed)
ED TO INPATIENT HANDOFF REPORT  Name/Age/Gender Debra Hodges 68 y.o. female  Code Status Code Status History    Date Active Date Inactive Code Status Order ID Comments User Context   04/18/2018 1556 04/20/2018 1531 Full Code [id number]  Damita Lack, MD ED   03/07/2018 0013 03/09/2018 1508 Full Code [id number]  Vianne Bulls, MD ED   Advance Care Planning Activity      Home/SNF/Other Home  Chief Complaint Cancer Patient / Vomiting / Nausea   Level of Care/Admitting Diagnosis ED Disposition    ED Disposition Condition Comment   [place] [facility] Area: [facility] [[id number]]  Level of Care: Med-Surg [16]  Covid Evaluation: Screening Protocol (No Symptoms)  Diagnosis: Intractable vomiting with nausea [2353614]  Admitting Physician: Harvie Bridge [[id number]]  Attending Physician: Harvie Bridge [[id number]]  PT Class (Do Not Modify): Observation [104]  PT Acc Code (Do Not Modify): Observation [10022]       Medical History Past Medical History:  Diagnosis Date  . High blood pressure   . Liver lesion 06/26/2016  . Non-small cell carcinoma of lung, stage 4 ([place]) dx'd 10/2013    Allergies Allergies  Allergen Reactions  . Peanut-Containing Drug Products     Excessive mucous  . Penicillins     Hives, Childhood Allergy Has patient had a PCN reaction causing immediate rash, facial/tongue/throat swelling, SOB or lightheadedness with hypotension: Yes Has patient had a PCN reaction causing severe rash involving mucus membranes or skin necrosis: No Has patient had a PCN reaction that required hospitalization: No Has patient had a PCN reaction occurring within the last 10 years: No If all of the above answers are "NO", then may proceed with Cephalosporin use.     IV Location/Drains/Wounds Patient Lines/Drains/Airways Status   Active Line/Drains/Airways    Name:   Placement date:   Placement time:   Site:   Days:   Peripheral IV 11/17/18 Left  Antecubital   11/17/18    2136    Antecubital   1          Labs/Imaging Results for orders placed or performed during the hospital encounter of 11/17/18 (from the past 48 hour(s))  SARS Coronavirus 2 (CEPHEID - Performed in [place] hospital lab), Hosp Order     Status: None   Collection Time: 11/17/18  9:31 PM   Specimen: Nasopharyngeal Swab  Result Value Ref Range   SARS Coronavirus 2 NEGATIVE NEGATIVE    Comment: (NOTE) If result is NEGATIVE SARS-CoV-2 target nucleic acids are NOT DETECTED. The SARS-CoV-2 RNA is generally detectable in upper and lower  respiratory specimens during the acute phase of infection. The lowest  concentration of SARS-CoV-2 viral copies this assay can detect is 250  copies / mL. A negative result does not preclude SARS-CoV-2 infection  and should not be used as the sole basis for treatment or other  patient management decisions.  A negative result may occur with  improper specimen collection / handling, submission of specimen other  than nasopharyngeal swab, presence of viral mutation(s) within the  areas targeted by this assay, and inadequate number of viral copies  (<250 copies / mL). A negative result must be combined with clinical  observations, patient history, and epidemiological information. If result is POSITIVE SARS-CoV-2 target nucleic acids are DETECTED. The SARS-CoV-2 RNA is generally detectable in upper and lower  respiratory specimens dur ing the acute phase of infection.  Positive  results are indicative of active infection with SARS-CoV-2.  Clinical  correlation with patient history and other diagnostic information is  necessary to determine patient infection status.  Positive results do  not rule out bacterial infection or co-infection with other viruses. If result is PRESUMPTIVE POSTIVE SARS-CoV-2 nucleic acids MAY BE PRESENT.   A presumptive positive result was obtained on the submitted specimen  and confirmed on repeat  testing.  While 2019 novel coronavirus  (SARS-CoV-2) nucleic acids may be present in the submitted sample  additional confirmatory testing may be necessary for epidemiological  and / or clinical management purposes  to differentiate between  SARS-CoV-2 and other Sarbecovirus currently known to infect humans.  If clinically indicated additional testing with an alternate test  methodology [phone]) is advised. The SARS-CoV-2 RNA is generally  detectable in upper and lower respiratory sp ecimens during the acute  phase of infection. The expected result is Negative. Fact Sheet for Patients:  StrictlyIdeas.no Fact Sheet for Healthcare Providers: BankingDealers.co.za This test is not yet approved or cleared by the [place] FDA and has been authorized for detection and/or diagnosis of SARS-CoV-2 by FDA under an Emergency Use Authorization (EUA).  This EUA will remain in effect (meaning this test can be used) for the duration of the COVID-19 declaration under Section 564(b)(1) of the Act, 21 U.S.C. section 360bbb-3(b)(1), unless the authorization is terminated or revoked sooner. Performed at [facility], [name] [address], [place], [place] [address]   Lipase, blood     Status: None   Collection Time: 11/17/18  9:36 PM  Result Value Ref Range   Lipase 30 11 - 51 U/L    Comment: Performed at [facility], [place] [address], [place], [place] [address]  Comprehensive metabolic panel     Status: Abnormal   Collection Time: 11/17/18  9:36 PM  Result Value Ref Range   Sodium 138 135 - 145 mmol/L   Potassium 3.9 3.5 - 5.1 mmol/L   Chloride 101 98 - 111 mmol/L   CO2 23 22 - 32 mmol/L   Glucose, Bld 101 (H) 70 - 99 mg/dL   BUN 25 (H) 8 - 23 mg/dL   Creatinine, Ser 0.91 0.44 - 1.00 mg/dL   Calcium 8.8 (L) 8.9 - 10.3 mg/dL   Total Protein 7.2 6.5 - 8.1 g/dL   Albumin 3.4 (L) 3.5 - 5.0 g/dL   AST 37 15 - 41 U/L    ALT 22 0 - 44 U/L   Alkaline Phosphatase 79 38 - 126 U/L   Total Bilirubin 1.5 (H) 0.3 - 1.2 mg/dL   GFR calc non Af Amer >60 >60 mL/min   GFR calc Af Amer >60 >60 mL/min   Anion gap 14 5 - 15    Comment: Performed at [facility], [place] [address], [place], [place] [address]  CBC     Status: Abnormal   Collection Time: 11/17/18  9:36 PM  Result Value Ref Range   WBC 4.8 4.0 - 10.5 K/uL   RBC 3.75 (L) 3.87 - 5.11 MIL/uL   Hemoglobin 11.5 (L) 12.0 - 15.0 g/dL   HCT 35.0 (L) 36.0 - 46.0 %   MCV 93.3 80.0 - 100.0 fL   MCH 30.7 26.0 - 34.0 pg   MCHC 32.9 30.0 - 36.0 g/dL   RDW 15.3 11.5 - 15.5 %   Platelets 96 (L) 150 - 400 K/uL    Comment: REPEATED TO VERIFY PLATELET COUNT CONFIRMED BY SMEAR SPECIMEN CHECKED FOR CLOTS Immature Platelet Fraction may be  clinically indicated, consider ordering this additional test [id number]    nRBC 0.0 0.0 - 0.2 %    Comment: Performed at [facility], [place] [address], [place], [place] [address]   No results found.  Pending Labs Unresulted Labs (From admission, onward)    Start     Ordered   11/17/18 2047  Urinalysis, Routine w reflex microscopic  ONCE - STAT,   STAT     11/17/18 2046   Signed and Held  Creatinine, serum  (enoxaparin (LOVENOX)    CrCl >/= 30 ml/min)  Weekly,   R    Comments: while on enoxaparin therapy    Signed and Held   Signed and Held  Basic metabolic panel  Tomorrow morning,   R     Signed and Held   Signed and Held  CBC  Tomorrow morning,   R     Signed and Held          Vitals/Pain Today's Vitals   11/17/18 2300 11/17/18 2330 11/18/18 0000 11/18/18 0030  BP: 118/75 124/85 130/60 121/61  Pulse: 85 96 88 85  Resp: 20 20 18 20   Temp:      TempSrc:      SpO2: 98% 98% 98% 99%  Weight:      Height:        Isolation Precautions No active isolations  Medications Medications  sodium chloride flush (NS) 0.9 % injection 3 mL (0 mLs Intravenous Hold 11/17/18 2257)  sodium  chloride 0.9 % bolus 1,000 mL (0 mLs Intravenous Stopped 11/17/18 2321)  ondansetron (ZOFRAN) injection 4 mg (4 mg Intravenous Given 11/17/18 2144)    Mobility walks

## 2018-11-18 NOTE — Progress Notes (Signed)
HEMATOLOGY-ONCOLOGY PROGRESS NOTE  SUBJECTIVE: The patient had at least 7 episodes of vomiting at home that was not controlled with antiemetics.  She reports that she is feeling better this morning.  Vomiting seems to have resolved.  She has some mild nausea.  Denies headaches and visual changes.  Reports a rash under her breasts.  Denies chest discomfort, shortness of breath, cough, hemoptysis.  She has no other complaints this morning.  Oncology History Overview Note  Patient presented with sycopal episode to ED.  Work up showed LUL mass.  Malignant neoplasm of upper lobe of left lung   Primary site: Lung (Left)   Staging method: AJCC 7th Edition   Clinical: Stage IV (T1b, N3, M1b) signed by Curt Bears, MD on 11/15/2013 12:39 PM   Summary: Stage IV (T1b, N3, M1b)     Malignant neoplasm of upper lobe of left lung ([place])  10/27/2013 Pathology Results   Transbronchial biopsy LUL lung adenocarcinoma   10/27/2013 Surgery   Bronchoscopy   11/06/2013 Imaging   MRI Brain with and without contrast impression no acute or metastatic intracrainial abnoramlity.     11/13/2013 Initial Diagnosis   Malignant neoplasm of upper lobe of left lung   11/24/2013 Tumor Marker   Foundation One Genomic Alterations Identified EGFR amplification, [id number], [id number]   Non-small cell carcinoma of lung, stage 4 (HCC)  04/07/2015 Initial Diagnosis   Non-small cell carcinoma of lung, stage 4 ([place])   02/21/2018 -  Chemotherapy   The patient had palonosetron (ALOXI) injection 0.25 mg, 0.25 mg, Intravenous,  Once, 4 of 4 cycles Administration: 0.25 mg (02/21/2018), 0.25 mg (03/21/2018), 0.25 mg (04/11/2018), 0.25 mg (05/02/2018) pegfilgrastim (NEULASTA) injection 6 mg, 6 mg, Subcutaneous, Once, 4 of 4 cycles Administration: 6 mg (02/23/2018), 6 mg (03/23/2018), 6 mg (04/13/2018), 6 mg (05/04/2018) bevacizumab (AVASTIN) 1,500 mg in sodium chloride 0.9 % 100 mL chemo infusion, 15.3 mg/kg = 1,475 mg (100 % of original dose  15 mg/kg), Intravenous, Every 21 days, 10 of 14 cycles Dose modification: 15 mg/kg (original dose 15 mg/kg, Cycle 1) Administration: 1,500 mg (02/21/2018), 1,500 mg (03/21/2018), 1,500 mg (04/11/2018), 1,400 mg (05/02/2018), 1,400 mg (06/01/2018), 1,400 mg (06/20/2018), 1,400 mg (07/11/2018), 1,400 mg (07/31/2018), 1,400 mg (08/21/2018), 1,400 mg (09/11/2018) CARBOplatin (PARAPLATIN) 660 mg in sodium chloride 0.9 % 250 mL chemo infusion, 660 mg (100 % of original dose 655.2 mg), Intravenous,  Once, 4 of 4 cycles Dose modification: 655.2 mg (original dose 655.2 mg, Cycle 1), 655.2 mg (original dose 655.2 mg, Cycle 2), 655.2 mg (original dose 655.2 mg, Cycle 3), 540 mg (original dose 540 mg, Cycle 4) Administration: 660 mg (02/21/2018), 660 mg (03/21/2018), 660 mg (04/11/2018), 540 mg (05/02/2018) PACLitaxel (TAXOL) 426 mg in sodium chloride 0.9 % 500 mL chemo infusion (> '80mg'$ /m2), 200 mg/m2 = 426 mg, Intravenous,  Once, 4 of 4 cycles Dose modification: 150 mg/m2 (original dose 200 mg/m2, Cycle 4, Reason: Dose not tolerated) Administration: 426 mg (02/21/2018), 426 mg (03/21/2018), 426 mg (04/11/2018), 318 mg (05/02/2018) fosaprepitant (EMEND) 150 mg, dexamethasone (DECADRON) 12 mg in sodium chloride 0.9 % 145 mL IVPB, , Intravenous,  Once, 4 of 4 cycles Administration:  (02/21/2018),  (03/21/2018),  (04/11/2018),  (05/02/2018) atezolizumab (TECENTRIQ) 1,200 mg in sodium chloride 0.9 % 250 mL chemo infusion, 1,200 mg, Intravenous, Once, 10 of 14 cycles Administration: 1,200 mg (02/21/2018), 1,200 mg (03/21/2018), 1,200 mg (04/11/2018), 1,200 mg (05/02/2018), 1,200 mg (06/01/2018), 1,200 mg (06/20/2018), 1,200 mg (07/11/2018), 1,200 mg (07/31/2018), 1,200 mg (08/21/2018), 1,200 mg (  09/11/2018)  for chemotherapy treatment.       REVIEW OF SYSTEMS:   Constitutional: Denies fevers, chills or abnormal weight loss Eyes: Denies blurriness of vision Ears, nose, mouth, throat, and face: Denies mucositis or sore throat Respiratory:  Denies cough, dyspnea or wheezes Cardiovascular: Denies palpitation, chest discomfort Gastrointestinal: Has nausea.  No vomiting this morning.  Had some diarrhea prior to admission. Skin: Denies abnormal skin rashes Lymphatics: Denies new lymphadenopathy or easy bruising Neurological:Denies numbness, tingling or new weaknesses Behavioral/Psych: Mood is stable, no new changes  Extremities: No lower extremity edema All other systems were reviewed with the patient and are negative.  I have reviewed the past medical history, past surgical history, social history and family history with the patient and they are unchanged from previous note.   PHYSICAL EXAMINATION: ECOG PERFORMANCE STATUS: 1 - Symptomatic but completely ambulatory  Vitals:   11/18/18 0227 11/18/18 0516  BP: 138/69 121/62  Pulse: 86 88  Resp: 15 16  Temp: 98.1 F (36.7 C) 98.3 F (36.8 C)  SpO2: 100% 100%   Filed Weights   11/17/18 2001 11/18/18 0150  Weight: 180 lb (81.6 kg) 166 lb 14.2 oz (75.7 kg)    Intake/Output from previous day: 06/21 0701 - 06/22 0700 In: 1398.9 [P.O.:60; I.V.:338.9; IV Piggyback:1000] Out: 250 [Urine:250]  GENERAL:alert, no distress and comfortable SKIN: skin color, texture, turgor are normal, no rashes or significant lesions EYES: normal, Conjunctiva are pink and non-injected, sclera clear OROPHARYNX: Thrush noted on her tongue. NECK: supple, thyroid normal size, non-tender, without nodularity LYMPH:  no palpable lymphadenopathy in the cervical, axillary or inguinal LUNGS: clear to auscultation and percussion with normal breathing effort HEART: regular rate & rhythm and no murmurs and no lower extremity edema ABDOMEN:abdomen soft, non-tender and normal bowel sounds Musculoskeletal:no cyanosis of digits and no clubbing  NEURO: alert & oriented x 3 with fluent speech, no focal motor/sensory deficits SKIN: Candidiasis noted under her bilateral breasts.  LABORATORY DATA:  I have  reviewed the data as listed CMP Latest Ref Rng & Units 11/18/2018 11/17/2018 10/29/2018  Glucose 70 - 99 mg/dL 83 101(H) 92  BUN 8 - 23 mg/dL 19 25(H) 18  Creatinine 0.44 - 1.00 mg/dL 0.59 0.91 0.75  Sodium 135 - 145 mmol/L 140 138 133(L)  Potassium 3.5 - 5.1 mmol/L 3.8 3.9 4.5  Chloride 98 - 111 mmol/L 104 101 103  CO2 22 - 32 mmol/L '23 23 24  '$ Calcium 8.9 - 10.3 mg/dL 8.3(L) 8.8(L) 8.3(L)  Total Protein 6.5 - 8.1 g/dL - 7.2 5.9(L)  Total Bilirubin 0.3 - 1.2 mg/dL - 1.5(H) 0.7  Alkaline Phos 38 - 126 U/L - 79 112  AST 15 - 41 U/L - 37 30  ALT 0 - 44 U/L - 22 44    Lab Results  Component Value Date   WBC 4.5 11/18/2018   HGB 10.8 (L) 11/18/2018   HCT 34.8 (L) 11/18/2018   MCV 96.7 11/18/2018   PLT 85 (L) 11/18/2018   NEUTROABS 2.1 10/29/2018    No results found.  ASSESSMENT AND PLAN: 1.  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  Nausea and vomiting 4.  Oral candidiasis and intertrigo under breasts 5.  History of pulmonary embolism 6.  Mild anemia and thrombocytopenia secondary to treatment for her lung cancer.  -Recommend the  patient continue her Tagrisso 160 mg daily.  She will need to bring this medication from home and she has notified her husband to bring this medication to her. -We will obtain MRI of the brain with and without contrast for restaging of her disease given that she has developed nausea and vomiting. -Continue antiemetics as needed. -We will start the patient on fluconazole 100 mg daily x7 days.  Nystatin powder as needed under her breast. -Continue Eliquis. -Monitor daily CBC.  Transfuse packed red blood cells for hemoglobin less than 7.0 or active bleeding.  Transfuse platelets for a platelet count less than 20,000 or active bleeding.   We will continue to  follow this patient with you.   LOS: 0 days   Mikey Bussing, DNP, AGPCNP-BC, AOCNP 11/18/18

## 2018-11-19 LAB — BASIC METABOLIC PANEL
Anion gap: 10 (ref 5–15)
BUN: 11 mg/dL (ref 8–23)
CO2: 24 mmol/L (ref 22–32)
Calcium: 8.2 mg/dL — ABNORMAL LOW (ref 8.9–10.3)
Chloride: 102 mmol/L (ref 98–111)
Creatinine, Ser: 0.7 mg/dL (ref 0.44–1.00)
GFR calc Af Amer: >60 mL/min (ref >60)
GFR calc non Af Amer: >60 mL/min (ref >60)
Glucose, Bld: 94 mg/dL (ref 70–99)
Potassium: 3.5 mmol/L (ref 3.5–5.1)
Sodium: 136 mmol/L (ref 135–145)

## 2018-11-19 LAB — CBC
HCT: 35.4 % — ABNORMAL LOW (ref 36.0–46.0)
Hemoglobin: 11.3 g/dL — ABNORMAL LOW (ref 12.0–15.0)
MCH: 30.4 pg (ref 26.0–34.0)
MCHC: 31.9 g/dL (ref 30.0–36.0)
MCV: 95.2 fL (ref 80.0–100.0)
Platelets: 115 10*3/uL — ABNORMAL LOW (ref 150–400)
RBC: 3.72 MIL/uL — ABNORMAL LOW (ref 3.87–5.11)
RDW: 15.5 % (ref 11.5–15.5)
WBC: 5.5 10*3/uL (ref 4.0–10.5)
nRBC: 0 % (ref 0.0–0.2)

## 2018-11-19 MED ORDER — IPRATROPIUM BROMIDE 0.02 % IN SOLN: 0.5000 mg | Freq: Four times a day (QID) | RESPIRATORY_TRACT | Status: DC | PRN

## 2018-11-19 NOTE — Evaluation (Signed)
Occupational Therapy Evaluation Patient Details Name: Debra Hodges MRN: [id number] DOB: 01-18-1951 Today's Date: 11/19/2018    History of Present Illness 68 yo female admitted with intractable vomitng and nausea 2* chemo. Hx of PE, NSCLC with mets, neuropathy.   Clinical Impression   Pt admitted with N/V. Pt currently with functional limitations due to the deficits listed below (see OT Problem List).  Pt will benefit from skilled OT to increase their safety and independence with ADL and functional mobility for ADL to facilitate discharge to venue listed below.      Follow Up Recommendations  SNF;Home health OT;Supervision/Assistance - 24 hour    Equipment Recommendations  None recommended by OT    Recommendations for Other Services       Precautions / Restrictions Precautions Precautions: Fall Restrictions Weight Bearing Restrictions: No      Mobility Bed Mobility Overal bed mobility: Needs Assistance Bed Mobility: Supine to Sit   Sidelying to sit: Min assist Supine to sit: Min assist        Transfers Overall transfer level: Needs assistance Equipment used: 1 person hand held assist Transfers: Sit to/from [facility] Sit to Stand: Min assist;Mod assist Stand pivot transfers: Min assist;Mod assist       General transfer comment: Assist to rise, stabilize, control descent. VCs safety, technique, hand placement.    Balance Overall balance assessment: History of Falls;Needs assistance Sitting-balance support: Single extremity supported Sitting balance-Leahy Scale: Poor     Standing balance support: Bilateral upper extremity supported Standing balance-Leahy Scale: Poor                             ADL either performed or assessed with clinical judgement   ADL Overall ADL's : Needs assistance/impaired Eating/Feeding: Set up;Sitting   Grooming: Set up;Sitting   Upper Body Bathing: Set up;Sitting   Lower Body Bathing: Sit  to/from stand;Cueing for safety;Maximal assistance;Cueing for sequencing   Upper Body Dressing : Set up;Sitting   Lower Body Dressing: Maximal assistance;Sit to/from stand;Sitting/lateral leans   Toilet Transfer: Minimal assistance;BSC;Stand-pivot;Cueing for sequencing;Cueing for safety   Toileting- Clothing Manipulation and Hygiene: Sit to/from stand;Cueing for sequencing;Cueing for compensatory techniques;Cueing for safety;Moderate assistance         General ADL Comments: Pt agreeable to OOB with OT.  Pt did use BSC. Pt sat VERY quickly even with VC. Pt feels she may need ST SNF prior to Dc home     Vision Patient Visual Report: No change from baseline              Pertinent Vitals/Pain Pain Assessment: No/denies pain        Extremity/Trunk Assessment         Cervical / Trunk Assessment Cervical / Trunk Assessment: Normal   Communication Communication Communication: No difficulties   Cognition Arousal/Alertness: Awake/alert Behavior During Therapy: WFL for tasks assessed/performed Overall Cognitive Status: Within Functional Limits for tasks assessed                                                Home Living Family/patient expects to be discharged to:: Private residence Living Arrangements: Spouse/significant other Available Help at Discharge: Family Type of Home: House Home Access: Ramped entrance     Home Layout: One level     Bathroom Shower/Tub: Tub/shower unit  Home Equipment: [place] - 2 wheels;Wheelchair - manual          Prior Functioning/Environment Level of Independence: Needs assistance                 OT Problem List: Decreased strength;Decreased activity tolerance;Impaired balance (sitting and/or standing);Decreased safety awareness;Decreased knowledge of use of DME or AE;Decreased knowledge of precautions;Obesity      OT Treatment/Interventions: Self-care/ADL training;Patient/family  education;Therapeutic activities;DME and/or AE instruction    OT Goals(Current goals can be found in the care plan section) Acute Rehab OT Goals Patient Stated Goal: to get stronger OT Goal Formulation: With patient Time For Goal Achievement: 11/26/18 Potential to Achieve Goals: Good  OT Frequency: Min 2X/week   Barriers to D/C:               AM-PAC OT "6 Clicks" Daily Activity     Outcome Measure Help from another person eating meals?: None Help from another person taking care of personal grooming?: A Little Help from another person toileting, which includes using toliet, bedpan, or urinal?: A Little Help from another person bathing (including washing, rinsing, drying)?: A Little Help from another person to put on and taking off regular upper body clothing?: A Little Help from another person to put on and taking off regular lower body clothing?: A Lot 6 Click Score: 18   End of Session Equipment Utilized During Treatment: Gait belt Nurse Communication: Mobility status  Activity Tolerance: Patient tolerated treatment well Patient left: in chair;with call bell/phone within reach  OT Visit Diagnosis: Unsteadiness on feet (R26.81);Other abnormalities of gait and mobility (R26.89);Muscle weakness (generalized) (M62.81);Repeated falls (R29.6);History of falling (Z91.81)                Time: 1419-1440 OT Time Calculation (min): 21 min Charges:  OT General Charges $OT Visit: 1 Visit OT Evaluation $OT Eval Moderate Complexity: 1 Mod  Kari Baars, OT Acute Rehabilitation Services [phone] Office- [phone], Edwena Felty D 11/19/2018, 2:58 PM

## 2018-11-19 NOTE — Progress Notes (Signed)
HEMATOLOGY-ONCOLOGY PROGRESS NOTE  SUBJECTIVE: The patient reports that she is feeling better.  She has not had any recurrent nausea or vomiting.  She is still having some loose stools.  Her husband brought her Tagrisso from home and she has resumed this.  Denies headaches and dizziness.  Denies chest discomfort and shortness of breath.  She is afebrile and other vital signs are stable.  The patient had an MRI of the brain performed yesterday.  Oncology History Overview Note  Patient presented with sycopal episode to ED.  Work up showed LUL mass.  Malignant neoplasm of upper lobe of left lung   Primary site: Lung (Left)   Staging method: AJCC 7th Edition   Clinical: Stage IV (T1b, N3, M1b) signed by Curt Bears, MD on 11/15/2013 12:39 PM   Summary: Stage IV (T1b, N3, M1b)     Malignant neoplasm of upper lobe of left lung ([place])  10/27/2013 Pathology Results   Transbronchial biopsy LUL lung adenocarcinoma   10/27/2013 Surgery   Bronchoscopy   11/06/2013 Imaging   MRI Brain with and without contrast impression no acute or metastatic intracrainial abnoramlity.     11/13/2013 Initial Diagnosis   Malignant neoplasm of upper lobe of left lung   11/24/2013 Tumor Marker   Foundation One Genomic Alterations Identified EGFR amplification, L858R, [id number]   Non-small cell carcinoma of lung, stage 4 (HCC)  04/07/2015 Initial Diagnosis   Non-small cell carcinoma of lung, stage 4 ([place])   02/21/2018 -  Chemotherapy   The patient had palonosetron (ALOXI) injection 0.25 mg, 0.25 mg, Intravenous,  Once, 4 of 4 cycles Administration: 0.25 mg (02/21/2018), 0.25 mg (03/21/2018), 0.25 mg (04/11/2018), 0.25 mg (05/02/2018) pegfilgrastim (NEULASTA) injection 6 mg, 6 mg, Subcutaneous, Once, 4 of 4 cycles Administration: 6 mg (02/23/2018), 6 mg (03/23/2018), 6 mg (04/13/2018), 6 mg (05/04/2018) bevacizumab (AVASTIN) 1,500 mg in sodium chloride 0.9 % 100 mL chemo infusion, 15.3 mg/kg = 1,475 mg (100 % of original  dose 15 mg/kg), Intravenous, Every 21 days, 10 of 14 cycles Dose modification: 15 mg/kg (original dose 15 mg/kg, Cycle 1) Administration: 1,500 mg (02/21/2018), 1,500 mg (03/21/2018), 1,500 mg (04/11/2018), 1,400 mg (05/02/2018), 1,400 mg (06/01/2018), 1,400 mg (06/20/2018), 1,400 mg (07/11/2018), 1,400 mg (07/31/2018), 1,400 mg (08/21/2018), 1,400 mg (09/11/2018) CARBOplatin (PARAPLATIN) 660 mg in sodium chloride 0.9 % 250 mL chemo infusion, 660 mg (100 % of original dose 655.2 mg), Intravenous,  Once, 4 of 4 cycles Dose modification: 655.2 mg (original dose 655.2 mg, Cycle 1), 655.2 mg (original dose 655.2 mg, Cycle 2), 655.2 mg (original dose 655.2 mg, Cycle 3), 540 mg (original dose 540 mg, Cycle 4) Administration: 660 mg (02/21/2018), 660 mg (03/21/2018), 660 mg (04/11/2018), 540 mg (05/02/2018) PACLitaxel (TAXOL) 426 mg in sodium chloride 0.9 % 500 mL chemo infusion (> 60m/m2), 200 mg/m2 = 426 mg, Intravenous,  Once, 4 of 4 cycles Dose modification: 150 mg/m2 (original dose 200 mg/m2, Cycle 4, Reason: Dose not tolerated) Administration: 426 mg (02/21/2018), 426 mg (03/21/2018), 426 mg (04/11/2018), 318 mg (05/02/2018) fosaprepitant (EMEND) 150 mg, dexamethasone (DECADRON) 12 mg in sodium chloride 0.9 % 145 mL IVPB, , Intravenous,  Once, 4 of 4 cycles Administration:  (02/21/2018),  (03/21/2018),  (04/11/2018),  (05/02/2018) atezolizumab (TECENTRIQ) 1,200 mg in sodium chloride 0.9 % 250 mL chemo infusion, 1,200 mg, Intravenous, Once, 10 of 14 cycles Administration: 1,200 mg (02/21/2018), 1,200 mg (03/21/2018), 1,200 mg (04/11/2018), 1,200 mg (05/02/2018), 1,200 mg (06/01/2018), 1,200 mg (06/20/2018), 1,200 mg (07/11/2018), 1,200 mg (07/31/2018), 1,200  mg (08/21/2018), 1,200 mg (09/11/2018)  for chemotherapy treatment.       REVIEW OF SYSTEMS:   Constitutional: Denies fevers, chills  Eyes: Denies blurriness of vision Ears, nose, mouth, throat, and face: Denies mucositis or sore throat Respiratory: Denies cough,  dyspnea or wheezes Cardiovascular: Denies palpitation, chest discomfort Gastrointestinal: Nausea and vomiting have resolved.  Reports diarrhea. Skin: Denies abnormal skin rashes Lymphatics: Denies new lymphadenopathy or easy bruising Neurological:Denies numbness, tingling or new weaknesses Behavioral/Psych: Mood is stable, no new changes  Extremities: No lower extremity edema All other systems were reviewed with the patient and are negative.  I have reviewed the past medical history, past surgical history, social history and family history with the patient and they are unchanged from previous note.   PHYSICAL EXAMINATION: ECOG PERFORMANCE STATUS: 1 - Symptomatic but completely ambulatory  Vitals:   11/19/18 0445 11/19/18 0658  BP: 135/75   Pulse: (!) 119 98  Resp: 17   Temp: 98 F (36.7 C)   SpO2: 99% 99%   Filed Weights   11/17/18 2001 11/18/18 0150  Weight: 180 lb (81.6 kg) 166 lb 14.2 oz (75.7 kg)    Intake/Output from previous day: 06/22 0701 - 06/23 0700 In: 1506.6 [P.O.:120; I.V.:1386.6] Out: 450 [Urine:450]  GENERAL:alert, no distress and comfortable SKIN: skin color, texture, turgor are normal, no rashes or significant lesions EYES: normal, Conjunctiva are pink and non-injected, sclera clear OROPHARYNX: Thrush improved. NECK: supple, thyroid normal size, non-tender, without nodularity LYMPH:  no palpable lymphadenopathy in the cervical, axillary or inguinal LUNGS: clear to auscultation and percussion with normal breathing effort HEART: regular rate & rhythm and no murmurs and no lower extremity edema ABDOMEN:abdomen soft, non-tender and normal bowel sounds Musculoskeletal:no cyanosis of digits and no clubbing  NEURO: alert & oriented x 3 with fluent speech, no focal motor/sensory deficits SKIN: Candidiasis noted under her bilateral breasts; improving  LABORATORY DATA:  I have reviewed the data as listed CMP Latest Ref Rng & Units 11/19/2018 11/18/2018 11/17/2018   Glucose 70 - 99 mg/dL 94 83 101(H)  BUN 8 - 23 mg/dL 11 19 25(H)  Creatinine 0.44 - 1.00 mg/dL 0.70 0.59 0.91  Sodium 135 - 145 mmol/L 136 140 138  Potassium 3.5 - 5.1 mmol/L 3.5 3.8 3.9  Chloride 98 - 111 mmol/L 102 104 101  CO2 22 - 32 mmol/L '24 23 23  ' Calcium 8.9 - 10.3 mg/dL 8.2(L) 8.3(L) 8.8(L)  Total Protein 6.5 - 8.1 g/dL - - 7.2  Total Bilirubin 0.3 - 1.2 mg/dL - - 1.5(H)  Alkaline Phos 38 - 126 U/L - - 79  AST 15 - 41 U/L - - 37  ALT 0 - 44 U/L - - 22    Lab Results  Component Value Date   WBC 5.5 11/19/2018   HGB 11.3 (L) 11/19/2018   HCT 35.4 (L) 11/19/2018   MCV 95.2 11/19/2018   PLT 115 (L) 11/19/2018   NEUTROABS 2.1 10/29/2018    Mr Brain W Wo Contrast  Result Date: 11/18/2018 CLINICAL DATA:  68 year old female with non-small cell lung cancer, evidence of widespread leptomeningeal tumor on brain MRI in April. Status post whole brain radiation in May. EXAM: MRI HEAD WITHOUT AND WITH CONTRAST TECHNIQUE: Multiplanar, multiecho pulse sequences of the brain and surrounding structures were obtained without and with intravenous contrast. CONTRAST:  7 milliliters Gadavist COMPARISON:  Chat in hospital brain MRI 09/20/2018. FINDINGS: Brain: Stable cerebral volume. No restricted diffusion to suggest acute infarction. No midline shift, mass  effect, ventriculomegaly, extra-axial collection or acute intracranial hemorrhage. Cervicomedullary junction and pituitary are within normal limits. Regression of scattered abnormal T2 and FLAIR hyperintensity which was associated with the predominantly left 0 meningeal appearing enhancing metastatic disease in April. Accordingly, virtually all of the areas of enhancing metastatic disease demonstrated on 09/21/2018 have regressed. There are 1 or 2 punctate areas of enhancement in the cerebellum which appear stable (such as in the left hemisphere on series 11, image 17). No new brain metastasis is identified. Vascular: Major intracranial vascular  flow voids are stable. The major dural venous sinuses are enhancing and appear to be patent. Skull and upper cervical spine: Negative visible cervical spine and spinal cord. Stable bone marrow signal, within normal limits. Sinuses/Orbits: Negative orbits. Paranasal sinuses and mastoids are stable and well pneumatized. Other: Visible internal auditory structures appear normal. Scalp and face soft tissues appear negative. IMPRESSION: 1. Regression of metastatic disease to the brain following whole brain radiation. 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 2. No new intracranial abnormality. Electronically Signed   By: Genevie Ann M.D.   On: 11/18/2018 13:21    ASSESSMENT AND PLAN: 1.  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  Nausea and vomiting 4.  Oral candidiasis and intertrigo under breasts 5.  History of pulmonary embolism 6.  Mild anemia and thrombocytopenia secondary to treatment for her lung cancer.  -Continue Tagrisso 160 mg daily.   -MRI of the brain results were reviewed with the patient.  She has had regression of her metastatic disease.  She will keep outpatient follow-up with medical and radiation oncology. -Continue antiemetics as needed. -Advance diet as tolerated. -Continue fluconazole 100 mg daily for 7 days.  Continue nystatin powder. -Continue Eliquis. -Monitor daily CBC.  Hemoglobin and platelets are improved today.  Transfuse packed red blood cells for hemoglobin less than 7.0 or active bleeding.  Transfuse platelets for a platelet count less than 20,000 or active bleeding. -The patient may be discharged to home from our standpoint when she is tolerating her diet well without any nausea or vomiting. -She will keep her  outpatient follow-up for her CT of the chest, abdomen, pelvis on July 6.  We can cancel the MRI of the brain since that has already been performed.  She will keep her follow-up with Dr. Sondra Come on 6/25 and with Dr. Julien Nordmann on 7/9.   LOS: 1 day   Mikey Bussing, DNP, AGPCNP-BC, AOCNP 11/19/18

## 2018-11-19 NOTE — Progress Notes (Signed)
PROGRESS NOTE    Debra Hodges  GHW:[id number] DOB: 1951-02-28 DOA: 11/17/2018 PCP: [facility].     Brief Narrative:  Debra Hodges is a 68 y.o. 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 Earlie Server presents to the emergency department for evaluation of vomiting.  Patient was in a usual state of health until 3 to 4 days ago when she developed progressively worsening nausea, vomiting, loss of taste, dysphagia, intermittent diarrhea.  Her symptoms seem to be related to her chemotherapy however have been refractory to her Compazine.  New events last 24 hours / Subjective: Tolerating full liquid diet, however does not feel back to her normal yet. Having some diarrhea asking for imodium.   Assessment & Plan:   Active Problems:   Intractable vomiting with nausea   Primary malignant neoplasm of lung metastatic to other site [facility])   Intractable nausea and vomiting   Nausea and vomiting secondary to chemotherapy -Appreciate oncology consultation -As her IV has infiltrated, will stop IV fluids.  If patient's oral intake is not adequate, will need to replace her IV  -Slowly advance diet to soft diet today  Generalized weakness -PT recommending home health  Intertriginous candidiasis -Topical nystatin -Started on fluconazole for 7 days  Stage IV non-small cell lung cancer -Follows with Dr. Julien Nordmann -Continue Tagrisso -MRI brain shows regression of her metastatic disease -Patient to follow-up outpatient with CT chest abdomen pelvis as scheduled on 7/6.  Follow-up with Dr. Sondra Come on 6/25 and with Dr. Julien Nordmann on 7/9  Essential hypertension -Continue spironolactone  Hypothyroidism -TSH low <0.08, free T4 elevated 2.12.  Stop Synthroid.  Repeat lab work as an outpatient and resume at a lower dose as indicated  History of  PE -Continue Eliquis  Peripheral neuropathy -Continue gabapentin    DVT prophylaxis: Eliquis Code Status: Full code Family Communication: None Disposition Plan: Improvement in p.o. intake.  Slowly advance diet today to soft diet    Consultants:   Oncology  Procedures:   None  Antimicrobials:  Anti-infectives (From admission, onward)   Start     Dose/Rate Route Frequency Ordered Stop   11/18/18 1100  fluconazole (DIFLUCAN) tablet 100 mg     100 mg Oral Daily 11/18/18 0929 11/25/18 0959       Objective: Vitals:   11/18/18 1421 11/18/18 2024 11/19/18 0445 11/19/18 0658  BP: 133/72 131/63 135/75   Pulse: (!) 105 89 (!) 119 98  Resp: 18 17 17    Temp: 98.6 F (37 C) 98.7 F (37.1 C) 98 F (36.7 C)   TempSrc: Oral Oral Oral   SpO2: 97% 98% 99% 99%  Weight:      Height:        Intake/Output Summary (Last 24 hours) at 11/19/2018 1104 Last data filed at 11/19/2018 1000 Gross per 24 hour  Intake 1665.97 ml  Output 450 ml  Net 1215.97 ml   Filed Weights   11/17/18 2001 11/18/18 0150  Weight: 81.6 kg 75.7 kg    Examination: General exam: Appears calm and comfortable  Respiratory system: Clear to auscultation. Respiratory effort normal. Cardiovascular system: S1 & S2 heard, RRR. No JVD, murmurs, rubs, gallops or clicks. No pedal edema. Gastrointestinal system: Abdomen is nondistended, soft and nontender. No organomegaly or masses felt. Normal bowel sounds heard. Central nervous system: Alert and oriented. No focal neurological deficits. Extremities: Symmetric 5 x  5 power. Skin: No rashes, lesions or ulcers Psychiatry: Judgement and insight appear normal. Mood & affect appropriate.   Data Reviewed: I have personally reviewed following labs and imaging studies  CBC: Recent Labs  Lab 11/17/18 2136 11/18/18 0324 11/19/18 0447  WBC 4.8 4.5 5.5  HGB 11.5* 10.8* 11.3*  HCT 35.0* 34.8* 35.4*  MCV 93.3 96.7 95.2  PLT 96* 85* 782*   Basic Metabolic  Panel: Recent Labs  Lab 11/17/18 2136 11/18/18 0324 11/19/18 0447  NA 138 140 136  K 3.9 3.8 3.5  CL 101 104 102  CO2 23 23 24   GLUCOSE 101* 83 94  BUN 25* 19 11  CREATININE 0.91 0.59 0.70  CALCIUM 8.8* 8.3* 8.2*   GFR: Estimated Creatinine Clearance: 70 mL/min (by C-G formula based on SCr of 0.7 mg/dL). Liver Function Tests: Recent Labs  Lab 11/17/18 2136  AST 37  ALT 22  ALKPHOS 79  BILITOT 1.5*  PROT 7.2  ALBUMIN 3.4*   Recent Labs  Lab 11/17/18 2136  LIPASE 30   No results for input(s): AMMONIA in the last 168 hours. Coagulation Profile: No results for input(s): INR, PROTIME in the last 168 hours. Cardiac Enzymes: No results for input(s): CKTOTAL, CKMB, CKMBINDEX, TROPONINI in the last 168 hours. BNP (last 3 results) No results for input(s): PROBNP in the last 8760 hours. HbA1C: No results for input(s): HGBA1C in the last 72 hours. CBG: No results for input(s): GLUCAP in the last 168 hours. Lipid Profile: No results for input(s): CHOL, HDL, LDLCALC, TRIG, CHOLHDL, LDLDIRECT in the last 72 hours. Thyroid Function Tests: Recent Labs    11/18/18 0820  FREET4 2.12*   Anemia Panel: No results for input(s): VITAMINB12, FOLATE, FERRITIN, TIBC, IRON, RETICCTPCT in the last 72 hours. Sepsis Labs: No results for input(s): PROCALCITON, LATICACIDVEN in the last 168 hours.  Recent Results (from the past 240 hour(s))  SARS Coronavirus 2 (CEPHEID - Performed in [place] hospital lab), Hosp Order     Status: None   Collection Time: 11/17/18  9:31 PM   Specimen: Nasopharyngeal Swab  Result Value Ref Range Status   SARS Coronavirus 2 NEGATIVE NEGATIVE Final    Comment: (NOTE) If result is NEGATIVE SARS-CoV-2 target nucleic acids are NOT DETECTED. The SARS-CoV-2 RNA is generally detectable in upper and lower  respiratory specimens during the acute phase of infection. The lowest  concentration of SARS-CoV-2 viral copies this assay can detect is 250  copies /  mL. A negative result does not preclude SARS-CoV-2 infection  and should not be used as the sole basis for treatment or other  patient management decisions.  A negative result may occur with  improper specimen collection / handling, submission of specimen other  than nasopharyngeal swab, presence of viral mutation(s) within the  areas targeted by this assay, and inadequate number of viral copies  (<250 copies / mL). A negative result must be combined with clinical  observations, patient history, and epidemiological information. If result is POSITIVE SARS-CoV-2 target nucleic acids are DETECTED. The SARS-CoV-2 RNA is generally detectable in upper and lower  respiratory specimens dur ing the acute phase of infection.  Positive  results are indicative of active infection with SARS-CoV-2.  Clinical  correlation with patient history and other diagnostic information is  necessary to determine patient infection status.  Positive results do  not rule out bacterial infection or co-infection with other viruses. If result is PRESUMPTIVE POSTIVE SARS-CoV-2 nucleic acids MAY BE PRESENT.   A  presumptive positive result was obtained on the submitted specimen  and confirmed on repeat testing.  While 2019 novel coronavirus  (SARS-CoV-2) nucleic acids may be present in the submitted sample  additional confirmatory testing may be necessary for epidemiological  and / or clinical management purposes  to differentiate between  SARS-CoV-2 and other Sarbecovirus currently known to infect humans.  If clinically indicated additional testing with an alternate test  methodology [phone]) is advised. The SARS-CoV-2 RNA is generally  detectable in upper and lower respiratory sp ecimens during the acute  phase of infection. The expected result is Negative. Fact Sheet for Patients:  StrictlyIdeas.no Fact Sheet for Healthcare Providers: BankingDealers.co.za This test is  not yet approved or cleared by the [place] FDA and has been authorized for detection and/or diagnosis of SARS-CoV-2 by FDA under an Emergency Use Authorization (EUA).  This EUA will remain in effect (meaning this test can be used) for the duration of the COVID-19 declaration under Section 564(b)(1) of the Act, 21 U.S.C. section 360bbb-3(b)(1), unless the authorization is terminated or revoked sooner. 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 Status post whole brain radiation in May. EXAM: MRI HEAD WITHOUT AND WITH CONTRAST TECHNIQUE: Multiplanar, multiecho pulse sequences of the brain and surrounding structures were obtained without and with intravenous contrast. CONTRAST:  7 milliliters Gadavist COMPARISON:  Chat in hospital brain MRI 09/20/2018. FINDINGS: Brain: Stable cerebral volume. No restricted diffusion to suggest acute infarction. No midline shift, mass effect, ventriculomegaly, extra-axial collection or acute intracranial hemorrhage. Cervicomedullary junction and pituitary are within normal limits. Regression of scattered abnormal T2 and FLAIR hyperintensity which was associated with the predominantly left 0 meningeal appearing enhancing metastatic disease in April. Accordingly, virtually all of the areas of enhancing metastatic disease demonstrated on 09/21/2018 have regressed. There are 1 or 2 punctate areas of enhancement in the cerebellum which appear stable (such as in the left hemisphere on series 11, image 17). No new brain metastasis is identified. Vascular: Major intracranial vascular flow voids are stable. The major dural venous sinuses are enhancing and appear to be patent. Skull and upper cervical spine: Negative visible  cervical spine and spinal cord. Stable bone marrow signal, within normal limits. Sinuses/Orbits: Negative orbits. Paranasal sinuses and mastoids are stable and well pneumatized. Other: Visible internal auditory structures appear normal. Scalp and face soft tissues appear negative. IMPRESSION: 1. Regression of metastatic disease to the brain following whole brain radiation. 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 2. No new intracranial abnormality. Electronically Signed   By: Genevie Ann M.D.   On: 11/18/2018 13:21      Scheduled Meds:  apixaban  5 mg Oral BID   calcium carbonate  1 tablet Oral Q breakfast   fluconazole  100 mg Oral Daily   gabapentin  300 mg Oral TID   nystatin   Topical TID   osimertinib mesylate  160 mg Oral Daily   sodium chloride flush  3 mL Intravenous Once   spironolactone  25 mg Oral Daily   vitamin C  500 mg Oral Daily   Continuous Infusions:    LOS: 1 day    Time spent: 20 minutes   Dessa Phi, DO [facility]  [email or web address] 11/19/2018, 11:04 AM

## 2018-11-20 DIAGNOSIS — I10 Essential (primary) hypertension: Secondary | ICD-10-CM

## 2018-11-20 DIAGNOSIS — G629 Polyneuropathy, unspecified: Secondary | ICD-10-CM

## 2018-11-20 LAB — BASIC METABOLIC PANEL
Anion gap: 7 (ref 5–15)
BUN: 6 mg/dL — ABNORMAL LOW (ref 8–23)
CO2: 26 mmol/L (ref 22–32)
Calcium: 8 mg/dL — ABNORMAL LOW (ref 8.9–10.3)
Chloride: 105 mmol/L (ref 98–111)
Creatinine, Ser: 0.53 mg/dL (ref 0.44–1.00)
GFR calc Af Amer: >60 mL/min (ref >60)
GFR calc non Af Amer: >60 mL/min (ref >60)
Glucose, Bld: 93 mg/dL (ref 70–99)
Potassium: 3.8 mmol/L (ref 3.5–5.1)
Sodium: 138 mmol/L (ref 135–145)

## 2018-11-20 LAB — CBC
HCT: 34.3 % — ABNORMAL LOW (ref 36.0–46.0)
Hemoglobin: 11.1 g/dL — ABNORMAL LOW (ref 12.0–15.0)
MCH: 29.8 pg (ref 26.0–34.0)
MCHC: 32.4 g/dL (ref 30.0–36.0)
MCV: 92.2 fL (ref 80.0–100.0)
Platelets: 81 10*3/uL — ABNORMAL LOW (ref 150–400)
RBC: 3.72 MIL/uL — ABNORMAL LOW (ref 3.87–5.11)
RDW: 15.3 % (ref 11.5–15.5)
WBC: 3.2 10*3/uL — ABNORMAL LOW (ref 4.0–10.5)
nRBC: 0 % (ref 0.0–0.2)

## 2018-11-20 MED ORDER — FLUCONAZOLE 100 MG PO TABS: 100.0000 mg | ORAL_TABLET | Freq: Every day | ORAL | 0 refills | Status: AC

## 2018-11-20 NOTE — Discharge Summary (Signed)
Physician Discharge Summary  Debra Hodges AOZ:[id number] DOB: 1950-11-14 DOA: 11/17/2018  PCP: [place] date: 11/17/2018 Discharge date: 11/20/2018  Admitted From: Inpatient Disposition: home  Recommendations for Outpatient Follow-up:  1. Follow up with Dr Sondra Come 6/25 2. 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 Caroll Rancher to the emergency department for evaluation of vomiting. Patient was in a usual state of health until 3 to 4 days ago when she developed progressively worsening nausea, vomiting, loss of taste, dysphagia, intermittent diarrhea. Her symptoms seem to be related to her chemotherapy however have been refractory to her Compazine.   Hospital course:   Nausea and vomiting secondary to chemotherapy -Appreciate oncology consultation, patient is improving she will follow-up with Dr. Earlie Server as an outpatient. -Patient tolerated diet will continue with soft diet at home advance as tolerated.  Generalized weakness -PT recommending home health, unfortunately patient is declined home health as she has her husband and daughter at home.  Intertriginous candidiasis -On the hospital patient received topical nystatin.  To continue and complete a 7-day course of fluconazole as an outpatient.  Prescription sent electronically to her pharmacy  Stage IV non-small cell lung cancer -Follows with Dr. Julien Nordmann next appointment 7/9 -Continue Tagrisso -MRI brain shows regression of her metastatic disease -Patient to follow-up outpatient with CT chest abdomen pelvis  as scheduled on 7/6.  Follow-up with Dr. Valaria Good 6/25 and with Dr. Julien Nordmann on 7/9  Essential hypertension -Continue spironolactone on discharge without changes.  Hypothyroidism -Patient will continue her home medications.  Will have close monitoring of her outpatient provider before discontinuing Synthroid.  Discussed this with the patient who did not want to discontinue her Synthroid.  History of PE -Continue Eliquis outpatient PCP to follow  Peripheral neuropathy -Continue gabapentin without changes  Discharge Diagnoses:  Active Problems:   Intractable vomiting with nausea   Primary malignant neoplasm of lung metastatic to other site [facility])   Intractable nausea and vomiting    Discharge Instructions  Discharge Instructions    Call MD for:   Complete by: As directed    Any acute change in medical condition   Diet - low sodium heart healthy   Complete by: As directed    Increase activity slowly   Complete by: As directed      Allergies as of 11/20/2018      Reactions   Peanut-containing Drug Products    Excessive mucous   Penicillins    Hives, Childhood Allergy Has patient had a PCN reaction causing immediate rash, facial/tongue/throat swelling, SOB or lightheadedness with hypotension: Yes Has patient had a PCN reaction causing severe rash involving mucus membranes or skin necrosis: No Has patient had a PCN reaction that required hospitalization: No Has patient had a PCN reaction occurring within the last 10 years: No If all of the above answers are "NO", then may proceed with Cephalosporin use.      Medication List    TAKE these medications   acetaminophen 325 MG tablet Commonly known as: TYLENOL Take 650 mg by mouth every 6 (six) hours as needed for mild pain. Reported on 06/15/2015   calcium carbonate 600 MG Tabs tablet Commonly known as: OS-CAL Take 600 mg by mouth  daily.   Climara 0.1 mg/24hr patch Generic drug: estradiol Place 1 patch onto the skin  once a week. On Thursday   Clinpro 5000 1.1 % Pste Generic drug: Sodium Fluoride Place 1 application onto teeth 2 (two) times daily.   Eliquis 5 MG Tabs tablet Generic drug: apixaban TAKE 1 TABLET(5 MG) BY MOUTH TWICE DAILY What changed: See the new instructions.   fluconazole 100 MG tablet Commonly known as: DIFLUCAN Take 1 tablet (100 mg total) by mouth daily for 7 days. Start taking on: November 21, 2018   gabapentin 100 MG capsule Commonly known as: NEURONTIN TAKE 3 CAPSULES(300 MG) BY MOUTH THREE TIMES DAILY What changed: See the new instructions.   levothyroxine 50 MCG tablet Commonly known as: SYNTHROID Take 50 mcg by mouth at bedtime.   loperamide 2 MG capsule Commonly known as: IMODIUM Take 2 mg by mouth daily as needed for diarrhea or loose stools. Reported on 11/22/2015   ONE-A-DAY WOMENS PO Take 1 tablet by mouth daily.   osimertinib mesylate 80 MG tablet Commonly known as: Tagrisso Take 2 tablets (160 mg total) by mouth daily.   oxyCODONE-acetaminophen 5-325 MG tablet Commonly known as: PERCOCET/ROXICET Take 1 tablet by mouth every 6 (six) hours as needed for severe pain.   potassium chloride SA 20 MEQ tablet Commonly known as: K-DUR Take 1 tablet (20 mEq total) by mouth at bedtime.   prochlorperazine 10 MG tablet Commonly known as: COMPAZINE TAKE 1 TABLET(10 MG) BY MOUTH EVERY 6 HOURS AS NEEDED FOR NAUSEA OR VOMITING What changed: See the new instructions.   spironolactone 25 MG tablet Commonly known as: ALDACTONE Take 1 tablet (25 mg total) by mouth daily.   vitamin C 500 MG tablet Commonly known as: ASCORBIC ACID Take 500 mg by mouth daily.       Allergies  Allergen Reactions  . Peanut-Containing Drug Products     Excessive mucous  . Penicillins     Hives, Childhood Allergy Has patient had a PCN reaction causing immediate rash, facial/tongue/throat swelling, SOB or lightheadedness with hypotension: Yes Has patient had a PCN reaction  causing severe rash involving mucus membranes or skin necrosis: No Has patient had a PCN reaction that required hospitalization: No Has patient had a PCN reaction occurring within the last 10 years: No If all of the above answers are "NO", then may proceed with Cephalosporin use.     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 Status post whole brain radiation in May. EXAM: MRI HEAD WITHOUT AND WITH CONTRAST TECHNIQUE: Multiplanar, multiecho pulse sequences of the brain and surrounding structures were obtained without and with intravenous contrast. CONTRAST:  7 milliliters Gadavist COMPARISON:  Chat in hospital brain MRI 09/20/2018. FINDINGS: Brain: Stable cerebral volume. No restricted diffusion to suggest acute infarction. No midline shift, mass effect, ventriculomegaly, extra-axial collection or acute intracranial hemorrhage. Cervicomedullary junction and pituitary are within normal limits. Regression of scattered abnormal T2 and FLAIR hyperintensity which was associated with the predominantly left 0 meningeal appearing enhancing metastatic disease in April. Accordingly, virtually all of the areas of enhancing metastatic disease demonstrated on 09/21/2018 have regressed. There are 1 or 2 punctate areas of enhancement in the cerebellum which appear stable (such as in the left hemisphere on series 11, image 17). No new brain metastasis is identified. Vascular: Major intracranial vascular flow  voids are stable. The major dural venous sinuses are enhancing and appear to be patent. Skull and upper cervical spine: Negative visible cervical spine and spinal cord. Stable bone marrow signal, within normal limits. Sinuses/Orbits: Negative orbits. Paranasal sinuses and mastoids are stable and well pneumatized. Other: Visible  internal auditory structures appear normal. Scalp and face soft tissues appear negative. IMPRESSION: 1. Regression of metastatic disease to the brain following whole brain radiation. 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 2. No new intracranial abnormality. Electronically Signed   By: Genevie Ann M.D.   On: 11/18/2018 13:21       Subjective: Patient improving.  Still reports lower extremity weakness which is been persistent for well over a month. [facility] benefits of home health patient has declined.  Discharge Exam: Vitals:   11/20/18 0504 11/20/18 0945  BP: 130/68 131/65  Pulse: 87 98  Resp: 17 18  Temp: 98.7 F (37.1 C) 97.8 F (36.6 C)  SpO2: 99% 99%   Vitals:   11/19/18 1343 11/19/18 2057 11/20/18 0504 11/20/18 0945  BP: 129/75 135/71 130/68 131/65  Pulse: 90 95 87 98  Resp: 18 19 17 18   Temp: 98.3 F (36.8 C) 98.9 F (37.2 C) 98.7 F (37.1 C) 97.8 F (36.6 C)  TempSrc: Oral Oral Oral Oral  SpO2: 99% 100% 99% 99%  Weight:      Height:        General: Pt is alert, awake, not in acute distress Cardiovascular: RRR, S1/S2 +, no rubs, no gallops Respiratory: CTA bilaterally, no wheezing, no rhonchi Abdominal: Soft, NT, ND, bowel sounds + Extremities: no edema, no cyanosis, mild lower extremity weakness    The results of significant diagnostics from this hospitalization (including imaging, microbiology, ancillary and laboratory) are listed below for reference.     Microbiology: Recent Results (from the past 240 hour(s))  SARS Coronavirus 2 (CEPHEID - Performed in [place] hospital lab), Hosp Order     Status: None   Collection Time: 11/17/18  9:31 PM   Specimen: Nasopharyngeal Swab  Result Value Ref Range Status   SARS Coronavirus 2 NEGATIVE NEGATIVE Final    Comment: (NOTE) If result is NEGATIVE SARS-CoV-2 target nucleic acids are NOT DETECTED. The SARS-CoV-2 RNA is generally detectable in upper and  lower  respiratory specimens during the acute phase of infection. The lowest  concentration of SARS-CoV-2 viral copies this assay can detect is 250  copies / mL. A negative result does not preclude SARS-CoV-2 infection  and should not be used as the sole basis for treatment or other  patient management decisions.  A negative result may occur with  improper specimen collection / handling, submission of specimen other  than nasopharyngeal swab, presence of viral mutation(s) within the  areas targeted by this assay, and inadequate number of viral copies  (<250 copies / mL). A negative result must be combined with clinical  observations, patient history, and epidemiological information. If result is POSITIVE SARS-CoV-2 target nucleic acids are DETECTED. The SARS-CoV-2 RNA is generally detectable in upper and lower  respiratory specimens dur ing the acute phase of infection.  Positive  results are indicative of active infection with SARS-CoV-2.  Clinical  correlation with patient history and other diagnostic information is  necessary to determine patient infection status.  Positive results do  not rule out bacterial infection or co-infection with other viruses. If result is PRESUMPTIVE POSTIVE SARS-CoV-2 nucleic acids MAY BE PRESENT.   A presumptive  positive result was obtained on the submitted specimen  and confirmed on repeat testing.  While 2019 novel coronavirus  (SARS-CoV-2) nucleic acids may be present in the submitted sample  additional confirmatory testing may be necessary for epidemiological  and / or clinical management purposes  to differentiate between  SARS-CoV-2 and other Sarbecovirus currently known to infect humans.  If clinically indicated additional testing with an alternate test  methodology [phone]) is advised. The SARS-CoV-2 RNA is generally  detectable in upper and lower respiratory sp ecimens during the acute  phase of infection. The expected result is  Negative. Fact Sheet for Patients:  StrictlyIdeas.no Fact Sheet for Healthcare Providers: BankingDealers.co.za This test is not yet approved or cleared by the [place] FDA and has been authorized for detection and/or diagnosis of SARS-CoV-2 by FDA under an Emergency Use Authorization (EUA).  This EUA will remain in effect (meaning this test can be used) for the duration of the COVID-19 declaration under Section 564(b)(1) of the Act, 21 U.S.C. section 360bbb-3(b)(1), unless the authorization is terminated or revoked sooner. Performed at [facility], [place] [address] [place], [place] [address]      Labs: BNP (last 3 results) Recent Labs    03/06/18 2209  BNP 12.4   Basic Metabolic Panel: Recent Labs  Lab 11/17/18 2136 11/18/18 0324 11/19/18 0447 11/20/18 0342  NA 138 140 136 138  K 3.9 3.8 3.5 3.8  CL 101 104 102 105  CO2 23 23 24 26   GLUCOSE 101* 83 94 93  BUN 25* 19 11 6*  CREATININE 0.91 0.59 0.70 0.53  CALCIUM 8.8* 8.3* 8.2* 8.0*   Liver Function Tests: Recent Labs  Lab 11/17/18 2136  AST 37  ALT 22  ALKPHOS 79  BILITOT 1.5*  PROT 7.2  ALBUMIN 3.4*   Recent Labs  Lab 11/17/18 2136  LIPASE 30   No results for input(s): AMMONIA in the last 168 hours. CBC: Recent Labs  Lab 11/17/18 2136 11/18/18 0324 11/19/18 0447 11/20/18 0342  WBC 4.8 4.5 5.5 3.2*  HGB 11.5* 10.8* 11.3* 11.1*  HCT 35.0* 34.8* 35.4* 34.3*  MCV 93.3 96.7 95.2 92.2  PLT 96* 85* 115* 81*   Cardiac Enzymes: No results for input(s): CKTOTAL, CKMB, CKMBINDEX, TROPONINI in the last 168 hours. BNP: Invalid input(s): POCBNP CBG: No results for input(s): GLUCAP in the last 168 hours. D-Dimer No results for input(s): DDIMER in the last 72 hours. Hgb A1c No results for input(s): HGBA1C in the last 72 hours. Lipid Profile No results for input(s): CHOL, HDL, LDLCALC, TRIG, CHOLHDL, LDLDIRECT in the last 72  hours. Thyroid function studies No results for input(s): TSH, T4TOTAL, T3FREE, THYROIDAB in the last 72 hours.  Invalid input(s): FREET3 Anemia work up No results for input(s): VITAMINB12, FOLATE, FERRITIN, TIBC, IRON, RETICCTPCT in the last 72 hours. Urinalysis    Component Value Date/Time   COLORURINE YELLOW 11/17/2018 2047   APPEARANCEUR CLEAR 11/17/2018 2047   LABSPEC 1.023 11/17/2018 2047   PHURINE 5.0 11/17/2018 2047   GLUCOSEU NEGATIVE 11/17/2018 2047   HGBUR SMALL (A) 11/17/2018 2047   BILIRUBINUR NEGATIVE 11/17/2018 2047   KETONESUR 80 (A) 11/17/2018 2047   PROTEINUR NEGATIVE 11/17/2018 2047   NITRITE NEGATIVE 11/17/2018 2047   LEUKOCYTESUR TRACE (A) 11/17/2018 2047   Sepsis Labs Invalid input(s): PROCALCITONIN,  WBC,  LACTICIDVEN Microbiology Recent Results (from the past 240 hour(s))  SARS Coronavirus 2 (CEPHEID - Performed in [place] hospital lab), [facility]  Status: None   Collection Time: 11/17/18  9:31 PM   Specimen: Nasopharyngeal Swab  Result Value Ref Range Status   SARS Coronavirus 2 NEGATIVE NEGATIVE Final    Comment: (NOTE) If result is NEGATIVE SARS-CoV-2 target nucleic acids are NOT DETECTED. The SARS-CoV-2 RNA is generally detectable in upper and lower  respiratory specimens during the acute phase of infection. The lowest  concentration of SARS-CoV-2 viral copies this assay can detect is 250  copies / mL. A negative result does not preclude SARS-CoV-2 infection  and should not be used as the sole basis for treatment or other  patient management decisions.  A negative result may occur with  improper specimen collection / handling, submission of specimen other  than nasopharyngeal swab, presence of viral mutation(s) within the  areas targeted by this assay, and inadequate number of viral copies  (<250 copies / mL). A negative result must be combined with clinical  observations, patient history, and epidemiological information. If result is  POSITIVE SARS-CoV-2 target nucleic acids are DETECTED. The SARS-CoV-2 RNA is generally detectable in upper and lower  respiratory specimens dur ing the acute phase of infection.  Positive  results are indicative of active infection with SARS-CoV-2.  Clinical  correlation with patient history and other diagnostic information is  necessary to determine patient infection status.  Positive results do  not rule out bacterial infection or co-infection with other viruses. If result is PRESUMPTIVE POSTIVE SARS-CoV-2 nucleic acids MAY BE PRESENT.   A presumptive positive result was obtained on the submitted specimen  and confirmed on repeat testing.  While 2019 novel coronavirus  (SARS-CoV-2) nucleic acids may be present in the submitted sample  additional confirmatory testing may be necessary for epidemiological  and / or clinical management purposes  to differentiate between  SARS-CoV-2 and other Sarbecovirus currently known to infect humans.  If clinically indicated additional testing with an alternate test  methodology [phone]) is advised. The SARS-CoV-2 RNA is generally  detectable in upper and lower respiratory sp ecimens during the acute  phase of infection. The expected result is Negative. Fact Sheet for Patients:  StrictlyIdeas.no Fact Sheet for Healthcare Providers: BankingDealers.co.za This test is not yet approved or cleared by the [place] FDA and has been authorized for detection and/or diagnosis of SARS-CoV-2 by FDA under an Emergency Use Authorization (EUA).  This EUA will remain in effect (meaning this test can be used) for the duration of the COVID-19 declaration under Section 564(b)(1) of the Act, 21 U.S.C. section 360bbb-3(b)(1), unless the authorization is terminated or revoked sooner. Performed at [facility], [place] [address] [place], [place] [address]      Time coordinating discharge: Over 30  minutes  SIGNED:   Nicolette Bang, MD  [facility] 11/20/2018, 10:57 AM Pager   If 7PM-7AM, please contact night-coverage [email or web address] Password [email or web address]

## 2018-11-20 NOTE — Progress Notes (Signed)
Pt alert and oriented, tolerating diet.  Pt told MD she did not want home health.  Pt was given d/c instructions and d/cd home.

## 2018-11-20 NOTE — Progress Notes (Signed)
HEMATOLOGY-ONCOLOGY PROGRESS NOTE  SUBJECTIVE: Feels better overall.  Tolerating soft diet without any difficulty.  Reports mild nausea but no vomiting.  Diarrhea has resolved.  Her only complaint is of lower extremity weakness.  She is afebrile and other vital signs are stable.  Oncology History Overview Note  Patient presented with sycopal episode to ED.  Work up showed LUL mass.  Malignant neoplasm of upper lobe of left lung   Primary site: Lung (Left)   Staging method: AJCC 7th Edition   Clinical: Stage IV (T1b, N3, M1b) signed by Curt Bears, MD on 11/15/2013 12:39 PM   Summary: Stage IV (T1b, N3, M1b)     Malignant neoplasm of upper lobe of left lung ([facility])  10/27/2013 Pathology Results   Transbronchial biopsy LUL lung adenocarcinoma   10/27/2013 Surgery   Bronchoscopy   11/06/2013 Imaging   MRI Brain with and without contrast impression no acute or metastatic intracrainial abnoramlity.     11/13/2013 Initial Diagnosis   Malignant neoplasm of upper lobe of left lung   11/24/2013 Tumor Marker   [employer] Genomic Alterations Identified EGFR amplification, [id number], [id number]   Non-small cell carcinoma of lung, stage 4 ([facility])  04/07/2015 Initial Diagnosis   Non-small cell carcinoma of lung, stage 4 ([place])   02/21/2018 -  Chemotherapy   The patient had palonosetron (ALOXI) injection 0.25 mg, 0.25 mg, Intravenous,  Once, 4 of 4 cycles Administration: 0.25 mg (02/21/2018), 0.25 mg (03/21/2018), 0.25 mg (04/11/2018), 0.25 mg (05/02/2018) pegfilgrastim (NEULASTA) injection 6 mg, 6 mg, Subcutaneous, Once, 4 of 4 cycles Administration: 6 mg (02/23/2018), 6 mg (03/23/2018), 6 mg (04/13/2018), 6 mg (05/04/2018) bevacizumab (AVASTIN) 1,500 mg in sodium chloride 0.9 % 100 mL chemo infusion, 15.3 mg/kg = 1,475 mg (100 % of original dose 15 mg/kg), Intravenous, Every 21 days, 10 of 14 cycles Dose modification: 15 mg/kg (original dose 15 mg/kg, Cycle 1) Administration: 1,500 mg (02/21/2018), 1,500  mg (03/21/2018), 1,500 mg (04/11/2018), 1,400 mg (05/02/2018), 1,400 mg (06/01/2018), 1,400 mg (06/20/2018), 1,400 mg (07/11/2018), 1,400 mg (07/31/2018), 1,400 mg (08/21/2018), 1,400 mg (09/11/2018) CARBOplatin (PARAPLATIN) 660 mg in sodium chloride 0.9 % 250 mL chemo infusion, 660 mg (100 % of original dose 655.2 mg), Intravenous,  Once, 4 of 4 cycles Dose modification: 655.2 mg (original dose 655.2 mg, Cycle 1), 655.2 mg (original dose 655.2 mg, Cycle 2), 655.2 mg (original dose 655.2 mg, Cycle 3), 540 mg (original dose 540 mg, Cycle 4) Administration: 660 mg (02/21/2018), 660 mg (03/21/2018), 660 mg (04/11/2018), 540 mg (05/02/2018) PACLitaxel (TAXOL) 426 mg in sodium chloride 0.9 % 500 mL chemo infusion (> 43m/m2), 200 mg/m2 = 426 mg, Intravenous,  Once, 4 of 4 cycles Dose modification: 150 mg/m2 (original dose 200 mg/m2, Cycle 4, Reason: Dose not tolerated) Administration: 426 mg (02/21/2018), 426 mg (03/21/2018), 426 mg (04/11/2018), 318 mg (05/02/2018) fosaprepitant (EMEND) 150 mg, dexamethasone (DECADRON) 12 mg in sodium chloride 0.9 % 145 mL IVPB, , Intravenous,  Once, 4 of 4 cycles Administration:  (02/21/2018),  (03/21/2018),  (04/11/2018),  (05/02/2018) atezolizumab (TECENTRIQ) 1,200 mg in sodium chloride 0.9 % 250 mL chemo infusion, 1,200 mg, Intravenous, Once, 10 of 14 cycles Administration: 1,200 mg (02/21/2018), 1,200 mg (03/21/2018), 1,200 mg (04/11/2018), 1,200 mg (05/02/2018), 1,200 mg (06/01/2018), 1,200 mg (06/20/2018), 1,200 mg (07/11/2018), 1,200 mg (07/31/2018), 1,200 mg (08/21/2018), 1,200 mg (09/11/2018)  for chemotherapy treatment.       REVIEW OF SYSTEMS:   Constitutional: Denies fevers, chills  Eyes: Denies blurriness of vision Ears, nose, mouth,  throat, and face: Denies mucositis or sore throat Respiratory: Denies cough, dyspnea or wheezes Cardiovascular: Denies palpitation, chest discomfort Gastrointestinal: Mild nausea without vomiting.  Diarrhea has resolved. Skin: Denies abnormal  skin rashes Lymphatics: Denies new lymphadenopathy or easy bruising Neurological:Denies numbness, tingling or new weaknesses Behavioral/Psych: Mood is stable, no new changes  Extremities: No lower extremity edema All other systems were reviewed with the patient and are negative.  I have reviewed the past medical history, past surgical history, social history and family history with the patient and they are unchanged from previous note.   PHYSICAL EXAMINATION: ECOG PERFORMANCE STATUS: 1 - Symptomatic but completely ambulatory  Vitals:   11/20/18 0504 11/20/18 0945  BP: 130/68 131/65  Pulse: 87 98  Resp: 17 18  Temp: 98.7 F (37.1 C) 97.8 F (36.6 C)  SpO2: 99% 99%   Filed Weights   11/17/18 2001 11/18/18 0150  Weight: 180 lb (81.6 kg) 166 lb 14.2 oz (75.7 kg)    Intake/Output from previous day: 06/23 0701 - 06/24 0700 In: 1195.6 [P.O.:1000; I.V.:195.6] Out: 600 [Urine:600]  GENERAL:alert, no distress and comfortable SKIN: skin color, texture, turgor are normal, no rashes or significant lesions EYES: normal, Conjunctiva are pink and non-injected, sclera clear OROPHARYNX: Thrush improved. NECK: supple, thyroid normal size, non-tender, without nodularity LYMPH:  no palpable lymphadenopathy in the cervical, axillary or inguinal LUNGS: clear to auscultation and percussion with normal breathing effort HEART: regular rate & rhythm and no murmurs and no lower extremity edema ABDOMEN:abdomen soft, non-tender and normal bowel sounds Musculoskeletal:no cyanosis of digits and no clubbing  NEURO: alert & oriented x 3 with fluent speech, no focal motor/sensory deficits SKIN: Candidiasis noted under her bilateral breasts; improving  LABORATORY DATA:  I have reviewed the data as listed CMP Latest Ref Rng & Units 11/20/2018 11/19/2018 11/18/2018  Glucose 70 - 99 mg/dL 93 94 83  BUN 8 - 23 mg/dL 6(L) 11 19  Creatinine 0.44 - 1.00 mg/dL 0.53 0.70 0.59  Sodium 135 - 145 mmol/L 138 136 140   Potassium 3.5 - 5.1 mmol/L 3.8 3.5 3.8  Chloride 98 - 111 mmol/L 105 102 104  CO2 22 - 32 mmol/L _0 Calcium 8.9 - 10.3 mg/dL 8.0(L) 8.2(L) 8.3(L)  Total Protein 6.5 - 8.1 g/dL - - -  Total Bilirubin 0.3 - 1.2 mg/dL - - -  Alkaline Phos 38 - 126 U/L - - -  AST 15 - 41 U/L - - -  ALT 0 - 44 U/L - - -    Lab Results  Component Value Date   WBC 3.2 (L) 11/20/2018   HGB 11.1 (L) 11/20/2018   HCT 34.3 (L) 11/20/2018   MCV 92.2 11/20/2018   PLT 81 (L) 11/20/2018   NEUTROABS 2.1 10/29/2018    Mr Brain W Wo Contrast  Result Date: 11/18/2018 CLINICAL DATA:  68 year old female with non-small cell lung cancer, evidence of widespread leptomeningeal tumor on brain MRI in April. Status post whole brain radiation in May. EXAM: MRI HEAD WITHOUT AND WITH CONTRAST TECHNIQUE: Multiplanar, multiecho pulse sequences of the brain and surrounding structures were obtained without and with intravenous contrast. CONTRAST:  7 milliliters Gadavist COMPARISON:  Chat in hospital brain MRI 09/20/2018. FINDINGS: Brain: Stable cerebral volume. No restricted diffusion to suggest acute infarction. No midline shift, mass effect, ventriculomegaly, extra-axial collection or acute intracranial hemorrhage. Cervicomedullary junction and pituitary are within normal limits. Regression of scattered abnormal T2 and FLAIR hyperintensity which was associated with the predominantly  left 0 meningeal appearing enhancing metastatic disease in April. Accordingly, virtually all of the areas of enhancing metastatic disease demonstrated on 09/21/2018 have regressed. There are 1 or 2 punctate areas of enhancement in the cerebellum which appear stable (such as in the left hemisphere on series 11, image 17). No new brain metastasis is identified. Vascular: Major intracranial vascular flow voids are stable. The major dural venous sinuses are enhancing and appear to be patent. Skull and upper cervical spine: Negative visible cervical spine and  spinal cord. Stable bone marrow signal, within normal limits. Sinuses/Orbits: Negative orbits. Paranasal sinuses and mastoids are stable and well pneumatized. Other: Visible internal auditory structures appear normal. Scalp and face soft tissues appear negative. IMPRESSION: 1. Regression of metastatic disease to the brain following whole brain radiation. 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 2. No new intracranial abnormality. Electronically Signed   By: Genevie Ann M.D.   On: 11/18/2018 13:21    ASSESSMENT AND PLAN: 1.  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  Nausea and vomiting 4.  Oral candidiasis and intertrigo under breasts 5.  History of pulmonary embolism 6.  Mild anemia and thrombocytopenia secondary to treatment for her lung cancer.  -Continue Tagrisso 160 mg daily.   -MRI of the brain results were reviewed with the patient.  She has had regression of her metastatic disease.  She will keep outpatient follow-up with medical and radiation oncology. -Continue antiemetics as needed. -Advance diet as tolerated. -Continue fluconazole 100 mg daily for total of 7 days.  Continue nystatin powder. -Continue Eliquis. -Monitor daily CBC.  This remains overall stable.  She has mild pancytopenia likely due to her chemotherapy.  No transfusions are indicated at this time. -The patient is stable for discharge from our standpoint. -She will keep her outpatient follow-up for her CT of the chest, abdomen, pelvis on July 6.  She will keep her follow-up with Dr. Julien Nordmann on 7/9.   LOS: 2 days   Mikey Bussing, DNP, AGPCNP-BC, AOCNP 11/20/18

## 2018-11-20 NOTE — Progress Notes (Signed)
Physical Therapy Treatment Patient Details Name: Debra Hodges MRN: [id number] DOB: 1950-08-08 Today's Date: 11/20/2018    History of Present Illness 68 yo female admitted with intractable vomitng and nausea 2* chemo. Hx of PE, NSCLC with mets, neuropathy.    PT Comments    Pt reports she has been up ambulating within room today and very fatigued.  Pt not agreeable to mobilize at this time due to fear of falling from LEs buckling.  Pt was agreeable to perform exercises however fatigued quickly.  Pt performed LE exercises in supine.  Recommend assist available at home since pt reports knees buckling with mobility.   Follow Up Recommendations  Home health PT;Supervision/Assistance - 24 hour     Equipment Recommendations  None recommended by PT    Recommendations for Other Services       Precautions / Restrictions Precautions Precautions: Fall Restrictions Weight Bearing Restrictions: No    Mobility  Bed Mobility                  Transfers                    Ambulation/Gait                 Stairs             Wheelchair Mobility    Modified Rankin (Stroke Patients Only)       Balance                                            Cognition Arousal/Alertness: Awake/alert Behavior During Therapy: WFL for tasks assessed/performed Overall Cognitive Status: Within Functional Limits for tasks assessed                                        Exercises General Exercises - Lower Extremity Ankle Circles/Pumps: AROM;20 reps;Both Quad Sets: AROM;15 reps;Both Heel Slides: AROM;10 reps;Both Hip ABduction/ADduction: AROM;Both;10 reps Straight Leg Raises: AROM;Both;10 reps    General Comments        Pertinent Vitals/Pain Pain Assessment: No/denies pain    Home Living                      Prior Function            PT Goals (current goals can now be found in the care plan section) Progress  towards PT goals: Progressing toward goals    Frequency    Min 3X/week      PT Plan Current plan remains appropriate    Co-evaluation              AM-PAC PT "6 Clicks" Mobility   Outcome Measure  Help needed turning from your back to your side while in a flat bed without using bedrails?: A Little Help needed moving from lying on your back to sitting on the side of a flat bed without using bedrails?: A Little Help needed moving to and from a bed to a chair (including a wheelchair)?: A Little Help needed standing up from a chair using your arms (e.g., wheelchair or bedside chair)?: A Lot Help needed to walk in hospital room?: A Lot Help needed climbing 3-5 steps with a railing? : A Lot 6 Click Score: 15  End of Session   Activity Tolerance: Patient limited by fatigue Patient left: in bed;with call bell/phone within reach   PT Visit Diagnosis: Muscle weakness (generalized) (M62.81)     Time: 6060-0459 PT Time Calculation (min) (ACUTE ONLY): 9 min  Charges:  $Therapeutic Exercise: 8-22 mins                    Carmelia Bake, PT, DPT Acute Rehabilitation Services Office: [phone] Pager: [phone]  Trena Platt 11/20/2018, 1:11 PM

## 2018-11-21 ENCOUNTER — Ambulatory Visit: Payer: Medicare Other | Admitting: Radiation Oncology

## 2018-12-02 ENCOUNTER — Ambulatory Visit (HOSPITAL_COMMUNITY): Admission: RE | Admit: 2018-12-02 | Payer: Medicare Other | Source: Ambulatory Visit

## 2018-12-02 ENCOUNTER — Other Ambulatory Visit (HOSPITAL_COMMUNITY): Payer: Medicare Other

## 2018-12-02 ENCOUNTER — Inpatient Hospital Stay: Payer: Medicare Other

## 2018-12-02 ENCOUNTER — Telehealth: Payer: Self-pay | Admitting: Internal Medicine

## 2018-12-02 NOTE — Telephone Encounter (Signed)
R/s appt per 7/6 sch message. Left message with appt date and time

## 2018-12-03 ENCOUNTER — Other Ambulatory Visit: Payer: Medicare Other

## 2018-12-05 ENCOUNTER — Inpatient Hospital Stay: Payer: Medicare Other | Admitting: Internal Medicine

## 2018-12-09 ENCOUNTER — Telehealth: Payer: Self-pay | Admitting: *Deleted

## 2018-12-09 NOTE — Telephone Encounter (Signed)
Spoke to pt .She cannot drink barium and prefers to do scan without it. I told her that was fine. She wants to wait until next week to try scan.  I instructed her to call back  this week if symptoms progress or do not get any better. Date on Ct and labs moved out to next week.

## 2018-12-09 NOTE — Telephone Encounter (Signed)
Pt husband called, pt unable to get out of the bed as she is very tired, no appetite,  eating or drinking except applesauce. Pt is able to get pills down without difficulty, has periods of dry heaving at night.  Husband is asking for MD's recommendations as pt is too weak to have CT scan and get out of bed. Message forward to MD for review.

## 2018-12-09 NOTE — Telephone Encounter (Signed)
We can delay the scan and visit until she feels better.  If she continues to get worse, we will may consider hospice care.

## 2018-12-10 ENCOUNTER — Ambulatory Visit (HOSPITAL_COMMUNITY): Payer: Medicare Other

## 2018-12-10 ENCOUNTER — Inpatient Hospital Stay: Payer: Medicare Other | Attending: Internal Medicine

## 2018-12-11 ENCOUNTER — Inpatient Hospital Stay (HOSPITAL_COMMUNITY)
Admission: EM | Admit: 2018-12-11 | Discharge: 2018-12-17 | DRG: 312 | Disposition: A | Discharge status: Skilled Nursing Facility | Payer: Medicare Other | Attending: Internal Medicine | Admitting: Internal Medicine

## 2018-12-11 ENCOUNTER — Telehealth: Payer: Self-pay | Admitting: *Deleted

## 2018-12-11 ENCOUNTER — Encounter (HOSPITAL_COMMUNITY): Payer: Self-pay

## 2018-12-11 ENCOUNTER — Other Ambulatory Visit: Payer: Self-pay

## 2018-12-11 DIAGNOSIS — I10 Essential (primary) hypertension: Secondary | ICD-10-CM | POA: Diagnosis present

## 2018-12-11 DIAGNOSIS — N179 Acute kidney failure, unspecified: Secondary | ICD-10-CM | POA: Diagnosis not present

## 2018-12-11 DIAGNOSIS — Z79899 Other long term (current) drug therapy: Secondary | ICD-10-CM

## 2018-12-11 DIAGNOSIS — N200 Calculus of kidney: Secondary | ICD-10-CM | POA: Diagnosis present

## 2018-12-11 DIAGNOSIS — Z1159 Encounter for screening for other viral diseases: Secondary | ICD-10-CM

## 2018-12-11 DIAGNOSIS — C7949 Secondary malignant neoplasm of other parts of nervous system: Secondary | ICD-10-CM | POA: Diagnosis present

## 2018-12-11 DIAGNOSIS — R627 Adult failure to thrive: Secondary | ICD-10-CM | POA: Diagnosis present

## 2018-12-11 DIAGNOSIS — C787 Secondary malignant neoplasm of liver and intrahepatic bile duct: Secondary | ICD-10-CM | POA: Diagnosis not present

## 2018-12-11 DIAGNOSIS — Z923 Personal history of irradiation: Secondary | ICD-10-CM

## 2018-12-11 DIAGNOSIS — R54 Age-related physical debility: Secondary | ICD-10-CM | POA: Diagnosis present

## 2018-12-11 DIAGNOSIS — Z7901 Long term (current) use of anticoagulants: Secondary | ICD-10-CM

## 2018-12-11 DIAGNOSIS — E041 Nontoxic single thyroid nodule: Secondary | ICD-10-CM | POA: Diagnosis present

## 2018-12-11 DIAGNOSIS — Z880 Allergy status to penicillin: Secondary | ICD-10-CM

## 2018-12-11 DIAGNOSIS — C3490 Malignant neoplasm of unspecified part of unspecified bronchus or lung: Secondary | ICD-10-CM | POA: Diagnosis not present

## 2018-12-11 DIAGNOSIS — E860 Dehydration: Secondary | ICD-10-CM | POA: Diagnosis present

## 2018-12-11 DIAGNOSIS — E039 Hypothyroidism, unspecified: Secondary | ICD-10-CM | POA: Diagnosis not present

## 2018-12-11 DIAGNOSIS — M7989 Other specified soft tissue disorders: Secondary | ICD-10-CM | POA: Diagnosis not present

## 2018-12-11 DIAGNOSIS — E440 Moderate protein-calorie malnutrition: Secondary | ICD-10-CM | POA: Diagnosis present

## 2018-12-11 DIAGNOSIS — Z03818 Encounter for observation for suspected exposure to other biological agents ruled out: Secondary | ICD-10-CM | POA: Diagnosis not present

## 2018-12-11 DIAGNOSIS — Z90710 Acquired absence of both cervix and uterus: Secondary | ICD-10-CM

## 2018-12-11 DIAGNOSIS — C7931 Secondary malignant neoplasm of brain: Secondary | ICD-10-CM | POA: Diagnosis present

## 2018-12-11 DIAGNOSIS — D6959 Other secondary thrombocytopenia: Secondary | ICD-10-CM | POA: Diagnosis present

## 2018-12-11 DIAGNOSIS — G629 Polyneuropathy, unspecified: Secondary | ICD-10-CM | POA: Diagnosis present

## 2018-12-11 DIAGNOSIS — E876 Hypokalemia: Secondary | ICD-10-CM | POA: Diagnosis present

## 2018-12-11 DIAGNOSIS — T451X5A Adverse effect of antineoplastic and immunosuppressive drugs, initial encounter: Secondary | ICD-10-CM | POA: Diagnosis present

## 2018-12-11 DIAGNOSIS — Z86711 Personal history of pulmonary embolism: Secondary | ICD-10-CM

## 2018-12-11 DIAGNOSIS — C3412 Malignant neoplasm of upper lobe, left bronchus or lung: Secondary | ICD-10-CM | POA: Diagnosis not present

## 2018-12-11 DIAGNOSIS — Z79890 Hormone replacement therapy (postmenopausal): Secondary | ICD-10-CM

## 2018-12-11 DIAGNOSIS — Z993 Dependence on wheelchair: Secondary | ICD-10-CM

## 2018-12-11 DIAGNOSIS — R55 Syncope and collapse: Principal | ICD-10-CM | POA: Diagnosis present

## 2018-12-11 DIAGNOSIS — Z91010 Allergy to peanuts: Secondary | ICD-10-CM

## 2018-12-11 DIAGNOSIS — D6481 Anemia due to antineoplastic chemotherapy: Secondary | ICD-10-CM | POA: Diagnosis present

## 2018-12-11 DIAGNOSIS — R112 Nausea with vomiting, unspecified: Secondary | ICD-10-CM | POA: Diagnosis not present

## 2018-12-11 DIAGNOSIS — Z9221 Personal history of antineoplastic chemotherapy: Secondary | ICD-10-CM

## 2018-12-11 DIAGNOSIS — Z86718 Personal history of other venous thrombosis and embolism: Secondary | ICD-10-CM

## 2018-12-11 DIAGNOSIS — Z6829 Body mass index (BMI) 29.0-29.9, adult: Secondary | ICD-10-CM

## 2018-12-11 DIAGNOSIS — Z809 Family history of malignant neoplasm, unspecified: Secondary | ICD-10-CM

## 2018-12-11 LAB — CBC
HCT: 35.8 % — ABNORMAL LOW (ref 36.0–46.0)
Hemoglobin: 11.6 g/dL — ABNORMAL LOW (ref 12.0–15.0)
MCH: 30 pg (ref 26.0–34.0)
MCHC: 32.4 g/dL (ref 30.0–36.0)
MCV: 92.5 fL (ref 80.0–100.0)
Platelets: 98 10*3/uL — ABNORMAL LOW (ref 150–400)
RBC: 3.87 MIL/uL (ref 3.87–5.11)
RDW: 15.5 % (ref 11.5–15.5)
WBC: 5.5 10*3/uL (ref 4.0–10.5)
nRBC: 0 % (ref 0.0–0.2)

## 2018-12-11 LAB — BASIC METABOLIC PANEL
Anion gap: 14 (ref 5–15)
BUN: 33 mg/dL — ABNORMAL HIGH (ref 8–23)
CO2: 30 mmol/L (ref 22–32)
Calcium: 8.6 mg/dL — ABNORMAL LOW (ref 8.9–10.3)
Chloride: 95 mmol/L — ABNORMAL LOW (ref 98–111)
Creatinine, Ser: 1.33 mg/dL — ABNORMAL HIGH (ref 0.44–1.00)
GFR calc Af Amer: 47 mL/min — ABNORMAL LOW (ref >60)
GFR calc non Af Amer: 41 mL/min — ABNORMAL LOW (ref >60)
Glucose, Bld: 154 mg/dL — ABNORMAL HIGH (ref 70–99)
Potassium: 2.8 mmol/L — ABNORMAL LOW (ref 3.5–5.1)
Sodium: 139 mmol/L (ref 135–145)

## 2018-12-11 LAB — PHOSPHORUS: Phosphorus: 3.7 mg/dL (ref 2.5–4.6)

## 2018-12-11 LAB — TROPONIN I (HIGH SENSITIVITY): Troponin I (High Sensitivity): 7 ng/L (ref <18)

## 2018-12-11 LAB — CBG MONITORING, ED: Glucose-Capillary: 150 mg/dL — ABNORMAL HIGH (ref 70–99)

## 2018-12-11 LAB — SARS CORONAVIRUS 2 BY RT PCR (HOSPITAL ORDER, PERFORMED IN ~~LOC~~ HOSPITAL LAB): SARS Coronavirus 2: NEGATIVE

## 2018-12-11 LAB — MAGNESIUM: Magnesium: 1.9 mg/dL (ref 1.7–2.4)

## 2018-12-11 MED ORDER — POTASSIUM CHLORIDE 10 MEQ/100ML IV SOLN
10.0000 meq | Freq: Once | INTRAVENOUS | Status: AC
  Administered 2018-12-11: 10 meq via INTRAVENOUS
  Filled 2018-12-11: qty 100

## 2018-12-11 MED ORDER — METOCLOPRAMIDE HCL 5 MG/ML IJ SOLN
5.0000 mg | Freq: Once | INTRAMUSCULAR | Status: AC
  Administered 2018-12-11: 5 mg via INTRAVENOUS
  Filled 2018-12-11: qty 2

## 2018-12-11 MED ORDER — SODIUM CHLORIDE 0.9% FLUSH
3.0000 mL | Freq: Once | INTRAVENOUS | Status: AC
  Administered 2018-12-11: 3 mL via INTRAVENOUS

## 2018-12-11 MED ORDER — ONDANSETRON HCL 4 MG/2ML IJ SOLN
4.0000 mg | Freq: Four times a day (QID) | INTRAMUSCULAR | Status: DC | PRN
  Administered 2018-12-17: 4 mg via INTRAVENOUS
  Filled 2018-12-11 (×4): qty 2

## 2018-12-11 MED ORDER — GABAPENTIN 300 MG PO CAPS
300.0000 mg | ORAL_CAPSULE | Freq: Three times a day (TID) | ORAL | Status: DC
  Administered 2018-12-12 – 2018-12-17 (×17): 300 mg via ORAL
  Filled 2018-12-11 (×18): qty 1

## 2018-12-11 MED ORDER — SODIUM CHLORIDE 0.9 % IV SOLN
INTRAVENOUS | Status: DC
  Administered 2018-12-11 – 2018-12-14 (×6): via INTRAVENOUS

## 2018-12-11 MED ORDER — POTASSIUM CHLORIDE CRYS ER 20 MEQ PO TBCR
20.0000 meq | EXTENDED_RELEASE_TABLET | Freq: Every day | ORAL | Status: DC
  Administered 2018-12-11 – 2018-12-16 (×6): 20 meq via ORAL
  Filled 2018-12-11 (×6): qty 1

## 2018-12-11 MED ORDER — OSIMERTINIB MESYLATE 80 MG PO TABS
160.0000 mg | ORAL_TABLET | Freq: Every day | ORAL | Status: DC
  Administered 2018-12-12 – 2018-12-16 (×5): 160 mg via ORAL

## 2018-12-11 MED ORDER — ACETAMINOPHEN 325 MG PO TABS: 650.0000 mg | ORAL_TABLET | Freq: Four times a day (QID) | ORAL | Status: DC | PRN

## 2018-12-11 MED ORDER — ACETAMINOPHEN 650 MG RE SUPP: 650.0000 mg | Freq: Four times a day (QID) | RECTAL | Status: DC | PRN

## 2018-12-11 MED ORDER — ONDANSETRON HCL 4 MG PO TABS
4.0000 mg | ORAL_TABLET | Freq: Four times a day (QID) | ORAL | Status: DC | PRN
  Administered 2018-12-16: 4 mg via ORAL
  Filled 2018-12-11: qty 1

## 2018-12-11 MED ORDER — ONDANSETRON HCL 4 MG/2ML IJ SOLN
4.0000 mg | Freq: Four times a day (QID) | INTRAMUSCULAR | Status: DC | PRN
  Administered 2018-12-12 – 2018-12-15 (×4): 4 mg via INTRAVENOUS
  Filled 2018-12-11: qty 2

## 2018-12-11 MED ORDER — LEVOTHYROXINE SODIUM 50 MCG PO TABS: 50.0000 ug | ORAL_TABLET | Freq: Every day | ORAL | Status: DC

## 2018-12-11 MED ORDER — APIXABAN 5 MG PO TABS
5.0000 mg | ORAL_TABLET | Freq: Two times a day (BID) | ORAL | Status: DC
  Administered 2018-12-12 – 2018-12-17 (×12): 5 mg via ORAL
  Filled 2018-12-11 (×12): qty 1

## 2018-12-11 MED ORDER — SODIUM CHLORIDE 0.9 % IV BOLUS
1000.0000 mL | Freq: Once | INTRAVENOUS | Status: AC
  Administered 2018-12-11: 1000 mL via INTRAVENOUS

## 2018-12-11 MED ORDER — POTASSIUM CHLORIDE 10 MEQ/100ML IV SOLN
10.0000 meq | INTRAVENOUS | Status: AC
  Administered 2018-12-11 – 2018-12-12 (×4): 10 meq via INTRAVENOUS
  Filled 2018-12-11 (×4): qty 100

## 2018-12-11 NOTE — Telephone Encounter (Signed)
Called pt to check on her status. Ppt advised she is blacking out when she moves, she is able to eat pudding ("sometimes"). Pt takes compazine and it seems to help some.  Pt advised she is very weak, not able to keep anything down, family does not allow her to get up from chair without assistance as she can't stand. Pt advised she too weak to try and come in for any IVF, she is too weak to go to the scan that is to be rescheduled.   Instructed pt to go to the Emergency Room for further evaluation, multiple blackouts are not normal.  Pt verbalized and confirmed she will have husband take her after she get off the phone.  Message forward to provider

## 2018-12-11 NOTE — H&P (Signed)
Debra Hodges XKG:[id number] DOB: [birth year] DOA: 12/11/2018     PCP: [facility].   Outpatient Specialists:     Oncology Dr. Renella Cunas Dr. Sondra Come Patient arrived to ER on 12/11/18 at [facility]  Patient coming from: home Lives   With family    Chief Complaint:  Chief Complaint  Patient presents with  . Near Syncope    HPI: Debra Hodges is a 68 y.o. 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 on  oral chemotherapy, hypertension, DVT since 2019 on eliquis    Presented with near syncope when she tries to stand up associated with worsening nausea and vomiting no associated fevers or chills no chest pain or shortness of breath usually gets very lightheaded when she tries to stand up from her chair has to sit back down very quickly  She have had syncope in the past and was diagnosed with mets to the brain now finished radiation therapy  Reports 3 weeks of intermittent vomiting of brown fluid denies any frank blood in vomitus no black stools no bright red blood per rectum She have had hard time tolerating her medications secondary to persistent nausea and vomiting She have had a lot  of nausea, and vomiting  Infectious risk factors:  Reports  N/V    In  ER RAPID COVID TEST  in house testing   Pending     Regarding pertinent Chronic problems:   History of peripheral neuropathy patient is on Neurontin     HTN on spironolactone   Hypothyroidism:  Lab Results  Component Value Date   TSH <0.080 (L) 10/29/2018   on synthroid     While in ER:  The following Work up has been ordered so far:  Orders Placed This Encounter  Procedures  . SARS Coronavirus 2 (CEPHEID - Performed in [place] hospital lab), [facility]  . Basic metabolic panel  . CBC  . Urinalysis, Routine w reflex microscopic  . Magnesium  . Phosphorus  . Creatinine, urine, random  . Sodium, urine,  random  . Cardiac monitoring  . Saline Lock IV, Maintain IV access  . Cardiac monitoring  . Orthostatic vital signs  . Consult to hospitalist  . Pulse oximetry, continuous  . CBG monitoring, ED  . ED EKG  . EKG 12-Lead  . Place in observation (patient's expected length of stay will be less than 2 midnights)    Following Medications were ordered in ER: Medications  sodium chloride flush (NS) 0.9 % injection 3 mL (has no administration in time range)  0.9 %  sodium chloride infusion (has no administration in time range)  potassium chloride 10 mEq in 100 mL IVPB (10 mEq Intravenous New Bag/Given 12/11/18 2154)  ondansetron (ZOFRAN) injection 4 mg (has no administration in time range)  potassium chloride 10 mEq in 100 mL IVPB (has no administration in time range)  sodium chloride 0.9 % bolus 1,000 mL (1,000 mLs Intravenous New Bag/Given 12/11/18 2153)  metoCLOPramide (REGLAN) injection 5 mg (5 mg Intravenous Given 12/11/18 2146)        Consult Orders  (From admission, onward)         Start     Ordered   12/11/18 2112  Consult to hospitalist  Once    Provider:  (Not yet assigned)  Question Answer Comment  Place call to: [facility]   Reason for Consult Admit  12/11/18 2111          Significant initial  Findings: Abnormal Labs Reviewed  BASIC METABOLIC PANEL - Abnormal; Notable for the following components:      Result Value   Potassium 2.8 (*)    Chloride 95 (*)    Glucose, Bld 154 (*)    BUN 33 (*)    Creatinine, Ser 1.33 (*)    Calcium 8.6 (*)    GFR calc non Af Amer 41 (*)    GFR calc Af Amer 47 (*)    All other components within normal limits  CBC - Abnormal; Notable for the following components:   Hemoglobin 11.6 (*)    HCT 35.8 (*)    Platelets 98 (*)    All other components within normal limits  CBG MONITORING, ED - Abnormal; Notable for the following components:   Glucose-Capillary 150 (*)    All other components within normal limits      Otherwise labs showing:    Recent Labs  Lab 12/11/18 1907  NA 139  K 2.8*  CO2 30  GLUCOSE 154*  BUN 33*  CREATININE 1.33*  CALCIUM 8.6*    Cr  * stable,  Up from baseline see below Lab Results  Component Value Date   CREATININE 1.33 (H) 12/11/2018   CREATININE 0.53 11/20/2018   CREATININE 0.70 11/19/2018    No results for input(s): AST, ALT, ALKPHOS, BILITOT, PROT, ALBUMIN in the last 168 hours. Lab Results  Component Value Date   CALCIUM 8.6 (L) 12/11/2018        WBC   *     Component Value Date/Time   WBC 5.5 12/11/2018 1907   ANC    Component Value Date/Time   NEUTROABS 2.1 10/29/2018 1001   NEUTROABS 2.5 04/30/2017 1541   ALC No results found for: LYMPHOABS    Plt: Lab Results  Component Value Date   PLT 98 (L) 12/11/2018     Lactic Acid, Venous    Component Value Date/Time   LATICACIDVEN 1.27 04/18/2018 1604       COVID-19 Labs   Lab Results  Component Value Date   SARSCOV2NAA NEGATIVE 11/17/2018    HG/HCT   stable,      Component Value Date/Time   HGB 11.6 (L) 12/11/2018 1907   HGB 12.5 10/29/2018 1001   HGB 12.7 04/30/2017 1541   HCT 35.8 (L) 12/11/2018 1907   HCT 38.4 04/30/2017 1541      BNP (last 3 results) Recent Labs    03/06/18 2209  BNP 51.0    ProBNP (last 3 results) No results for input(s): PROBNP in the last 8760 hours.  DM  labs:  HbA1C: No results for input(s): HGBA1C in the last 8760 hours.     CBG (last 3)  Recent Labs    12/11/18 1920  GLUCAP 150*     UA  ordered     None recent  ECG:  Personally reviewed by me showing: HR :132 Rhythm: Sinus tachycardia    , no evidence of ischemic changes QTC 489      ED Triage Vitals [12/11/18 1905]  Enc Vitals Group     BP 102/86     Pulse Rate (!) 131     Resp 14     Temp 98.4 F (36.9 C)     Temp Source Oral     SpO2 98 %     Weight 180 lb (81.6 kg)     Height 5\' 6"  (  1.676 m)     Head Circumference      Peak Flow      Pain Score       Pain Loc      Pain Edu?      Excl. in [place]?   WGNF(62)@       Latest  Blood pressure 123/72, pulse 99, temperature 98.4 F (36.9 C), temperature source Oral, resp. rate (!) 21, height 5\' 6"  (1.676 m), weight 81.6 kg, SpO2 98 %.     Hospitalist was called for admission for dehydration and AKI in the setting of intractable nausea and vomiting setting of chemotherapy resulting in hypokalemia episode of syncope associated with orthostasis   Review of Systems:    Pertinent positives include: nausea, vomiting,   Constitutional:  No weight loss, night sweats, Fevers, chills, fatigue, weight loss  HEENT:  No headaches, Difficulty swallowing,Tooth/dental problems,Sore throat,  No sneezing, itching, ear ache, nasal congestion, post nasal drip,  Cardio-vascular:  No chest pain, Orthopnea, PND, anasarca, dizziness, palpitations.no Bilateral lower extremity swelling  GI:  No heartburn, indigestion, abdominal pain, diarrhea, change in bowel habits, loss of appetite, melena, blood in stool, hematemesis Resp:  no shortness of breath at rest. No dyspnea on exertion, No excess mucus, no productive cough, No non-productive cough, No coughing up of blood.No change in color of mucus.No wheezing. Skin:  no rash or lesions. No jaundice GU:  no dysuria, change in color of urine, no urgency or frequency. No straining to urinate.  No flank pain.  Musculoskeletal:  No joint pain or no joint swelling. No decreased range of motion. No back pain.  Psych:  No change in mood or affect. No depression or anxiety. No memory loss.  Neuro: no localizing neurological complaints, no tingling, no weakness, no double vision, no gait abnormality, no slurred speech, no confusion  All systems reviewed and apart from [place] all are negative  Past Medical History:   Past Medical History:  Diagnosis Date  . High blood pressure   . Liver lesion 06/26/2016  . Non-small cell carcinoma of lung, stage 4 ([place]) dx'd 10/2013       Past Surgical History:  Procedure Laterality Date  . ABDOMINAL HYSTERECTOMY    . BREAST BIOPSY     Right breast  . VIDEO BRONCHOSCOPY Bilateral 10/27/2013   Procedure: VIDEO BRONCHOSCOPY WITH FLUORO;  Surgeon: Collene Gobble, MD;  Location: [facility];  Service: Cardiopulmonary;  Laterality: Bilateral;    Social History:  Ambulatory wheelchair bound,      reports that she has never smoked. She has never used smokeless tobacco. She reports that she does not drink alcohol or use drugs.     Family History:  Family History  Problem Relation Age of Onset  . High blood pressure Mother   . Cancer Mother        skin  . Cancer Father        esophageal  . Cancer Maternal Grandfather        breast    Allergies: Allergies  Allergen Reactions  . Peanut-Containing Drug Products     Excessive mucous  . Penicillins     Hives, Childhood Allergy Has patient had a PCN reaction causing immediate rash, facial/tongue/throat swelling, SOB or lightheadedness with hypotension: Yes Has patient had a PCN reaction causing severe rash involving mucus membranes or skin necrosis: No Has patient had a PCN reaction that required hospitalization: No Has patient had a PCN reaction occurring within the last 10 years:  No If all of the above answers are "NO", then may proceed with Cephalosporin use.      Prior to Admission medications   Medication Sig Start Date End Date Taking? Authorizing Provider  acetaminophen (TYLENOL) 325 MG tablet Take 650 mg by mouth every 6 (six) hours as needed for mild pain. Reported on 06/15/2015    [provider]  calcium carbonate (OS-CAL) 600 MG TABS tablet Take 600 mg by mouth daily.     [provider]  CLIMARA 0.1 MG/24HR patch Place 1 patch onto the skin once a week. On Thursday 08/12/13   [provider]  ELIQUIS 5 MG TABS tablet TAKE 1 TABLET(5 MG) BY MOUTH TWICE DAILY Patient taking differently: Take 5 mg by mouth 2 (two) times  daily.  10/16/18   Curt Bears, MD  gabapentin (NEURONTIN) 100 MG capsule TAKE 3 CAPSULES(300 MG) BY MOUTH THREE TIMES DAILY Patient taking differently: Take 300 mg by mouth 3 (three) times daily.  11/01/18   Curt Bears, MD  levothyroxine (SYNTHROID, LEVOTHROID) 50 MCG tablet Take 50 mcg by mouth at bedtime.  11/13/16   [provider]  loperamide (IMODIUM) 2 MG capsule Take 2 mg by mouth daily as needed for diarrhea or loose stools. Reported on 11/22/2015    [provider]  Multiple Vitamins-Calcium (ONE-A-DAY WOMENS PO) Take 1 tablet by mouth daily.    [provider]  osimertinib mesylate (TAGRISSO) 80 MG tablet Take 2 tablets (160 mg total) by mouth daily. 10/04/18   Curt Bears, MD  oxyCODONE-acetaminophen (PERCOCET/ROXICET) 5-325 MG tablet Take 1 tablet by mouth every 6 (six) hours as needed for severe pain. Patient not taking: Reported on 09/30/2018 05/02/18   Curt Bears, MD  potassium chloride SA (K-DUR,KLOR-CON) 20 MEQ tablet Take 1 tablet (20 mEq total) by mouth at bedtime. 09/11/18   Heilingoetter, Cassandra L, PA-C  prochlorperazine (COMPAZINE) 10 MG tablet TAKE 1 TABLET(10 MG) BY MOUTH EVERY 6 HOURS AS NEEDED FOR NAUSEA OR VOMITING Patient taking differently: Take 10 mg by mouth every 6 (six) hours as needed for nausea or vomiting.  02/11/18   Curt Bears, MD  Sodium Fluoride (CLINPRO 5000) 1.1 % PSTE Place 1 application onto teeth 2 (two) times daily.     [provider]  spironolactone (ALDACTONE) 25 MG tablet Take 1 tablet (25 mg total) by mouth daily. 09/11/18   Heilingoetter, Cassandra L, PA-C  vitamin C (ASCORBIC ACID) 500 MG tablet Take 500 mg by mouth daily.    [provider]   Physical Exam: Blood pressure 123/72, pulse 99, temperature 98.4 F (36.9 C), temperature source Oral, resp. rate (!) 21, height 5\' 6"  (1.676 m), weight 81.6 kg, SpO2 98 %. 1. General:  in No  Acute distress   Chronically ill  -appearing  actively  vomiting   2. Psychological: Alert and  Oriented 3. Head/ENT:  Dry Mucous Membranes                          Head Non traumatic, neck supple                           Poor Dentition 4. SKIN:   decreased Skin turgor,  Skin clean Dry and intact no rash 5. Heart: Regular rate and rhythm no Murmur, no Rub or gallop 6. Lungs:  no wheezes or crackles   7. Abdomen: Soft,  non-tender, Non distended  bowel  sounds present 8. Lower extremities: no clubbing, cyanosis, no  edema 9. Neurologically Grossly intact, moving all 4 extremities equally  10. MSK: Normal range of motion   All other LABS:     Recent Labs  Lab 12/11/18 1907  WBC 5.5  HGB 11.6*  HCT 35.8*  MCV 92.5  PLT 98*     Recent Labs  Lab 12/11/18 1907  NA 139  K 2.8*  CL 95*  CO2 30  GLUCOSE 154*  BUN 33*  CREATININE 1.33*  CALCIUM 8.6*     No results for input(s): AST, ALT, ALKPHOS, BILITOT, PROT, ALBUMIN in the last 168 hours.     Cultures:    Component Value Date/Time   SDES  04/18/2018 1438    URINE, RANDOM Performed at [facility], [place] [address], [place], [place] [address]    SPECREQUEST  04/18/2018 1438    NONE Performed at [facility], [place] [address] [place], [place] [address]    CULT (A) 04/18/2018 1438    30,000 COLONIES/mL MULTIPLE SPECIES PRESENT, SUGGEST RECOLLECTION   REPTSTATUS 04/20/2018 FINAL 04/18/2018 1438     Radiological Exams on Admission: No results found.  Chart has been reviewed    Assessment/Plan   69 y.o. 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 Syncope -discussion for most likely secondary to orthostasis secondary to dehydration we will monitor on telemetry obtain troponin For  completion check echogram Rehydrate and follow fluid status check orthostatics tomorrow to see if patient is still orthostatic prior to discharge  . AKI (acute kidney injury) ([place]) -rehydrate follow urine electrolytes . Dehydration-rehydrate and follow orthostatics . Non-small cell lung cancer with metastasis ([place]) chronic continue home medications email oncology patient has been admitted . Hypothyroidism- - Check TSH continue home medications at current dose  . Intractable nausea and vomiting - supportive management likely related to chemotherapy.  Defer to oncology if they think repeated brain imaging would be warranted.  Patient with known brain metastases status post radiation therapy. Vomitus is somewhat dark in color but patient denies any melena has been persistent for the past 3 weeks with stable hemoglobin Check Hemoccult stools . Hypokalemia - - will replace and repeat in AM,  check magnesium level and replace as needed  History of DVT will continue Eliquis for now Anemia chronic currently stable  Other plan as per orders.  DVT prophylaxis:  On eliquis     Code Status:  FULL CODE as per patient  I had personally discussed CODE STATUS with patient    Family Communication:   Family not at  Bedside    Disposition Plan:       To home once workup is complete and patient is stable    Would benefit from PT/OT eval prior to DC  Ordered                    Consults called: emailed Oncology   Admission status:  ED Disposition    ED Disposition Condition [place]: [place] [100102]  Level of Care: Telemetry [5]  Admit to tele based on following criteria: Eval of Syncope  Covid Evaluation: Asymptomatic Screening Protocol (No Symptoms)  Diagnosis: Syncope [938101]  Admitting Physician: Toy Baker [[id number]]  Attending Physician: Toy Baker [[id number]]  PT Class (Do Not Modify): Observation [104]  PT Acc Code (Do Not Modify):  Observation [10022]         Obs    Level of care  tele  For 12H  Precautions: Droplet, until COVID test is back   PPE: Used by the provider:   P100  eye Goggles,  Gloves     Senta Kantor 12/11/2018, 10:33 PM    [facility]     after 2 AM please page floor coverage PA If 7AM-7PM, please contact the day team taking care of the patient using [email or web address]

## 2018-12-11 NOTE — ED Notes (Signed)
Called to give report. Nurse unavailable at this time.

## 2018-12-11 NOTE — ED Triage Notes (Addendum)
Pt has hx of brain CA which she is undergoing tx for. Pt states that she has been blacking in and out since March, but has gotten worse the last 3 weeks. Pt states she hasn't fallen, but states she will feel herself start to fall in a chair. Pt states she cannot get up to go to the bathroom.

## 2018-12-11 NOTE — ED Provider Notes (Signed)
[facility] DEPT Provider Note   CSN: [id number] Arrival date & time: 12/11/18  1849     History   Chief Complaint Chief Complaint  Patient presents with  . Near Syncope    HPI Debra Hodges is a 68 y.o. female.     68 year old female with history of metastatic lung cancer presents with near syncope which is worse when she stands up.  Patient states that she is currently receiving oral chemotherapy and has had increased amounts of vomiting.  Denies any diarrhea.  No fever or chills.  No chest pain or shortness of breath prior to episodes.  No abdominal discomfort.  States that when she tries to go from her chair to standing she gets lightheaded and has passed out back into her chair.  Denies any injury from this.  No recent history of blood loss.     Past Medical History:  Diagnosis Date  . High blood pressure   . Liver lesion 06/26/2016  . Non-small cell carcinoma of lung, stage 4 ([place]) dx'd 10/2013    Patient Active Problem List   Diagnosis Date Noted  . Intractable nausea and vomiting 11/18/2018  . Primary malignant neoplasm of lung metastatic to other site [facility])   . Intractable vomiting with nausea 11/17/2018  . 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 Neutropenia due to and not concurrent with chemotherapy ([place]) 04/20/2018  . Nausea and vomiting 04/18/2018  . Neutropenia ([place]) 04/18/2018  . Acute pulmonary embolism ([place]) 03/07/2018  . Hypothyroidism 03/07/2018  . Non-small cell lung cancer with metastasis ([place])   . Goals of care, counseling/discussion 02/11/2018  . Encounter for antineoplastic immunotherapy 02/11/2018  . Metastases to the liver ([place]) 01/24/2017  . Liver lesion 06/26/2016  . Non-small cell carcinoma of lung, stage 4 ([place])   . Vaginal lesion 01/06/2015  . Encounter for antineoplastic chemotherapy 01/06/2015  . Malignant neoplasm of upper lobe of left lung ([place]) 11/13/2013  . Lung mass 10/16/2013    Past  Surgical History:  Procedure Laterality Date  . ABDOMINAL HYSTERECTOMY    . BREAST BIOPSY     Right breast  . VIDEO BRONCHOSCOPY Bilateral 10/27/2013   Procedure: VIDEO BRONCHOSCOPY WITH FLUORO;  Surgeon: Collene Gobble, MD;  Location: [facility];  Service: Cardiopulmonary;  Laterality: Bilateral;     OB History   No obstetric history on file.      Home Medications    Prior to Admission medications   Medication Sig Start Date End Date Taking? Authorizing Provider  acetaminophen (TYLENOL) 325 MG tablet Take 650 mg by mouth every 6 (six) hours as needed for mild pain. Reported on 06/15/2015    [provider]  calcium carbonate (OS-CAL) 600 MG TABS tablet Take 600 mg by mouth daily.     [provider]  CLIMARA 0.1 MG/24HR patch Place 1 patch onto the skin once a week. On Thursday 08/12/13   [provider]  ELIQUIS 5 MG TABS tablet TAKE 1 TABLET(5 MG) BY MOUTH TWICE DAILY Patient taking differently: Take 5 mg by mouth 2 (two) times daily.  10/16/18   Curt Bears, MD  gabapentin (NEURONTIN) 100 MG capsule TAKE 3 CAPSULES(300 MG) BY MOUTH THREE TIMES DAILY Patient taking differently: Take 300 mg by mouth 3 (three) times daily.  11/01/18   Curt Bears, MD  levothyroxine (SYNTHROID, LEVOTHROID) 50 MCG tablet Take 50 mcg by mouth at bedtime.  11/13/16   [provider]  loperamide (IMODIUM) 2 MG capsule Take 2  mg by mouth daily as needed for diarrhea or loose stools. Reported on 11/22/2015    [provider]  Multiple Vitamins-Calcium (ONE-A-DAY WOMENS PO) Take 1 tablet by mouth daily.    [provider]  osimertinib mesylate (TAGRISSO) 80 MG tablet Take 2 tablets (160 mg total) by mouth daily. 10/04/18   Curt Bears, MD  oxyCODONE-acetaminophen (PERCOCET/ROXICET) 5-325 MG tablet Take 1 tablet by mouth every 6 (six) hours as needed for severe pain. Patient not taking: Reported on 09/30/2018 05/02/18   Curt Bears, MD  potassium  chloride SA (K-DUR,KLOR-CON) 20 MEQ tablet Take 1 tablet (20 mEq total) by mouth at bedtime. 09/11/18   Heilingoetter, Cassandra L, PA-C  prochlorperazine (COMPAZINE) 10 MG tablet TAKE 1 TABLET(10 MG) BY MOUTH EVERY 6 HOURS AS NEEDED FOR NAUSEA OR VOMITING Patient taking differently: Take 10 mg by mouth every 6 (six) hours as needed for nausea or vomiting.  02/11/18   Curt Bears, MD  Sodium Fluoride (CLINPRO 5000) 1.1 % PSTE Place 1 application onto teeth 2 (two) times daily.     [provider]  spironolactone (ALDACTONE) 25 MG tablet Take 1 tablet (25 mg total) by mouth daily. 09/11/18   Heilingoetter, Cassandra L, PA-C  vitamin C (ASCORBIC ACID) 500 MG tablet Take 500 mg by mouth daily.    [provider]    Family History Family History  Problem Relation Age of Onset  . High blood pressure Mother   . Cancer Mother        skin  . Cancer Father        esophageal  . Cancer Maternal Grandfather        breast    Social History Social History   Tobacco Use  . Smoking status: Never Smoker  . Smokeless tobacco: Never Used  Substance Use Topics  . Alcohol use: No    Comment: social  . Drug use: No     Allergies   Peanut-containing drug products and Penicillins   Review of Systems Review of Systems  All other systems reviewed and are negative.    Physical Exam Updated Vital Signs BP 123/72   Pulse 99   Temp 98.4 F (36.9 C) (Oral)   Resp (!) 21   Ht 1.676 m (5\' 6" )   Wt 81.6 kg   SpO2 98%   BMI 29.05 kg/m   Physical Exam Vitals signs and nursing note reviewed.  Constitutional:      General: She is not in acute distress.    Appearance: Normal appearance. She is well-developed. She is not toxic-appearing.  HENT:     Head: Normocephalic and atraumatic.  Eyes:     General: Lids are normal.     Conjunctiva/sclera: Conjunctivae normal.     Pupils: Pupils are equal, round, and reactive to light.  Neck:     Musculoskeletal: Normal range of  motion and neck supple.     Thyroid: No thyroid mass.     Trachea: No tracheal deviation.  Cardiovascular:     Rate and Rhythm: Normal rate and regular rhythm.     Heart sounds: Normal heart sounds. No murmur. No gallop.   Pulmonary:     Effort: Pulmonary effort is normal. No respiratory distress.     Breath sounds: Normal breath sounds. No stridor. No decreased breath sounds, wheezing, rhonchi or rales.  Abdominal:     General: Bowel sounds are normal. There is no distension.     Palpations: Abdomen is soft.  Tenderness: There is no abdominal tenderness. There is no rebound.  Musculoskeletal: Normal range of motion.        General: No tenderness.  Skin:    General: Skin is warm and dry.     Findings: No abrasion or rash.  Neurological:     Mental Status: She is alert and oriented to person, place, and time.     GCS: GCS eye subscore is 4. GCS verbal subscore is 5. GCS motor subscore is 6.     Cranial Nerves: No cranial nerve deficit.     Sensory: No sensory deficit.  Psychiatric:        Speech: Speech normal.        Behavior: Behavior normal.      ED Treatments / Results  Labs (all labs ordered are listed, but only abnormal results are displayed) Labs Reviewed  BASIC METABOLIC PANEL - Abnormal; Notable for the following components:      Result Value   Potassium 2.8 (*)    Chloride 95 (*)    Glucose, Bld 154 (*)    BUN 33 (*)    Creatinine, Ser 1.33 (*)    Calcium 8.6 (*)    GFR calc non Af Amer 41 (*)    GFR calc Af Amer 47 (*)    All other components within normal limits  CBC - Abnormal; Notable for the following components:   Hemoglobin 11.6 (*)    HCT 35.8 (*)    Platelets 98 (*)    All other components within normal limits  CBG MONITORING, ED - Abnormal; Notable for the following components:   Glucose-Capillary 150 (*)    All other components within normal limits  URINALYSIS, ROUTINE W REFLEX MICROSCOPIC    EKG None  Radiology No results found.   Procedures Procedures (including critical care time)  Medications Ordered in ED Medications  sodium chloride flush (NS) 0.9 % injection 3 mL (has no administration in time range)  sodium chloride 0.9 % bolus 1,000 mL (has no administration in time range)  0.9 %  sodium chloride infusion (has no administration in time range)  metoCLOPramide (REGLAN) injection 5 mg (has no administration in time range)     Initial Impression / Assessment and Plan / ED Course  I have reviewed the triage vital signs and the nursing notes.  Pertinent labs & imaging results that were available during my care of the patient were reviewed by me and considered in my medical decision making (see chart for details).        Patient given IV fluids here as well as IV potassium for her dehydration.  Will admit to the hospitalist service Final Clinical Impressions(s) / ED Diagnoses   Final diagnoses:  None    ED Discharge Orders    None       Lacretia Leigh, MD 12/11/18 2113

## 2018-12-11 NOTE — Telephone Encounter (Signed)
-----   Message from Ardeen Garland, RN sent at 12/11/2018 12:09 PM EDT ----- Regarding: FW: CT scan She may need a call ----- Message ----- From: Lenore Cordia Sent: 12/09/2018   3:56 PM EDT To: Ardeen Gar[place], RN Subject: RE: CT scan                                    Husband called back, Verdis Frederickson the scheduler said , husband stated patient is sick, vomiting and passing out.  Patient does not wish to schedule an exam at this time..... Scheduler Verdis Frederickson advised husband to call Dr Julien Nordmann.  ----- Message ----- From: Ardeen Gar[place], RN Sent: 12/09/2018  12:02 PM EDT To: Lenore Cordia Subject: CT scan                                        I moved out her scan dates to next monday

## 2018-12-12 ENCOUNTER — Other Ambulatory Visit: Payer: Self-pay

## 2018-12-12 ENCOUNTER — Inpatient Hospital Stay (HOSPITAL_COMMUNITY): Payer: Medicare Other

## 2018-12-12 DIAGNOSIS — I361 Nonrheumatic tricuspid (valve) insufficiency: Secondary | ICD-10-CM | POA: Diagnosis not present

## 2018-12-12 DIAGNOSIS — D6959 Other secondary thrombocytopenia: Secondary | ICD-10-CM | POA: Diagnosis present

## 2018-12-12 DIAGNOSIS — J984 Other disorders of lung: Secondary | ICD-10-CM | POA: Diagnosis not present

## 2018-12-12 DIAGNOSIS — E039 Hypothyroidism, unspecified: Secondary | ICD-10-CM | POA: Diagnosis not present

## 2018-12-12 DIAGNOSIS — E041 Nontoxic single thyroid nodule: Secondary | ICD-10-CM | POA: Diagnosis present

## 2018-12-12 DIAGNOSIS — G9009 Other idiopathic peripheral autonomic neuropathy: Secondary | ICD-10-CM | POA: Diagnosis not present

## 2018-12-12 DIAGNOSIS — Z9221 Personal history of antineoplastic chemotherapy: Secondary | ICD-10-CM | POA: Diagnosis not present

## 2018-12-12 DIAGNOSIS — R112 Nausea with vomiting, unspecified: Secondary | ICD-10-CM | POA: Diagnosis present

## 2018-12-12 DIAGNOSIS — E876 Hypokalemia: Secondary | ICD-10-CM | POA: Diagnosis not present

## 2018-12-12 DIAGNOSIS — N200 Calculus of kidney: Secondary | ICD-10-CM | POA: Diagnosis not present

## 2018-12-12 DIAGNOSIS — D6481 Anemia due to antineoplastic chemotherapy: Secondary | ICD-10-CM | POA: Diagnosis present

## 2018-12-12 DIAGNOSIS — Z86711 Personal history of pulmonary embolism: Secondary | ICD-10-CM | POA: Diagnosis not present

## 2018-12-12 DIAGNOSIS — R54 Age-related physical debility: Secondary | ICD-10-CM | POA: Diagnosis present

## 2018-12-12 DIAGNOSIS — Z7401 Bed confinement status: Secondary | ICD-10-CM | POA: Diagnosis not present

## 2018-12-12 DIAGNOSIS — N179 Acute kidney failure, unspecified: Secondary | ICD-10-CM | POA: Diagnosis not present

## 2018-12-12 DIAGNOSIS — R52 Pain, unspecified: Secondary | ICD-10-CM | POA: Diagnosis not present

## 2018-12-12 DIAGNOSIS — C787 Secondary malignant neoplasm of liver and intrahepatic bile duct: Secondary | ICD-10-CM | POA: Diagnosis present

## 2018-12-12 DIAGNOSIS — Z6829 Body mass index (BMI) 29.0-29.9, adult: Secondary | ICD-10-CM | POA: Diagnosis not present

## 2018-12-12 DIAGNOSIS — G629 Polyneuropathy, unspecified: Secondary | ICD-10-CM | POA: Diagnosis present

## 2018-12-12 DIAGNOSIS — C3412 Malignant neoplasm of upper lobe, left bronchus or lung: Secondary | ICD-10-CM | POA: Diagnosis present

## 2018-12-12 DIAGNOSIS — Z86718 Personal history of other venous thrombosis and embolism: Secondary | ICD-10-CM | POA: Diagnosis not present

## 2018-12-12 DIAGNOSIS — R55 Syncope and collapse: Secondary | ICD-10-CM | POA: Diagnosis present

## 2018-12-12 DIAGNOSIS — M6281 Muscle weakness (generalized): Secondary | ICD-10-CM | POA: Diagnosis not present

## 2018-12-12 DIAGNOSIS — Z1159 Encounter for screening for other viral diseases: Secondary | ICD-10-CM | POA: Diagnosis not present

## 2018-12-12 DIAGNOSIS — C7949 Secondary malignant neoplasm of other parts of nervous system: Secondary | ICD-10-CM | POA: Diagnosis present

## 2018-12-12 DIAGNOSIS — C7931 Secondary malignant neoplasm of brain: Secondary | ICD-10-CM | POA: Diagnosis present

## 2018-12-12 DIAGNOSIS — C3490 Malignant neoplasm of unspecified part of unspecified bronchus or lung: Secondary | ICD-10-CM | POA: Diagnosis not present

## 2018-12-12 DIAGNOSIS — E860 Dehydration: Secondary | ICD-10-CM | POA: Diagnosis present

## 2018-12-12 DIAGNOSIS — E440 Moderate protein-calorie malnutrition: Secondary | ICD-10-CM | POA: Diagnosis present

## 2018-12-12 DIAGNOSIS — I10 Essential (primary) hypertension: Secondary | ICD-10-CM | POA: Diagnosis present

## 2018-12-12 DIAGNOSIS — R627 Adult failure to thrive: Secondary | ICD-10-CM | POA: Diagnosis present

## 2018-12-12 DIAGNOSIS — R911 Solitary pulmonary nodule: Secondary | ICD-10-CM | POA: Diagnosis not present

## 2018-12-12 DIAGNOSIS — M2550 Pain in unspecified joint: Secondary | ICD-10-CM | POA: Diagnosis not present

## 2018-12-12 DIAGNOSIS — M7989 Other specified soft tissue disorders: Secondary | ICD-10-CM | POA: Diagnosis not present

## 2018-12-12 LAB — COMPREHENSIVE METABOLIC PANEL
ALT: 11 U/L (ref 0–44)
AST: 24 U/L (ref 15–41)
Albumin: 2.5 g/dL — ABNORMAL LOW (ref 3.5–5.0)
Alkaline Phosphatase: 79 U/L (ref 38–126)
Anion gap: 6 (ref 5–15)
BUN: 27 mg/dL — ABNORMAL HIGH (ref 8–23)
CO2: 29 mmol/L (ref 22–32)
Calcium: 7.6 mg/dL — ABNORMAL LOW (ref 8.9–10.3)
Chloride: 101 mmol/L (ref 98–111)
Creatinine, Ser: 0.77 mg/dL (ref 0.44–1.00)
GFR calc Af Amer: >60 mL/min (ref >60)
GFR calc non Af Amer: >60 mL/min (ref >60)
Glucose, Bld: 102 mg/dL — ABNORMAL HIGH (ref 70–99)
Potassium: 3.8 mmol/L (ref 3.5–5.1)
Sodium: 136 mmol/L (ref 135–145)
Total Bilirubin: 1.1 mg/dL (ref 0.3–1.2)
Total Protein: 5.4 g/dL — ABNORMAL LOW (ref 6.5–8.1)

## 2018-12-12 LAB — URINALYSIS, ROUTINE W REFLEX MICROSCOPIC
Bilirubin Urine: NEGATIVE
Glucose, UA: NEGATIVE mg/dL
Ketones, ur: NEGATIVE mg/dL
Nitrite: NEGATIVE
Protein, ur: 30 mg/dL — AB
RBC / HPF: >50 RBC/hpf — ABNORMAL HIGH (ref 0–5)
Specific Gravity, Urine: 1.017 (ref 1.005–1.030)
WBC, UA: >50 WBC/hpf — ABNORMAL HIGH (ref 0–5)
pH: 6 (ref 5.0–8.0)

## 2018-12-12 LAB — CBC
HCT: 31.7 % — ABNORMAL LOW (ref 36.0–46.0)
Hemoglobin: 10 g/dL — ABNORMAL LOW (ref 12.0–15.0)
MCH: 29.6 pg (ref 26.0–34.0)
MCHC: 31.5 g/dL (ref 30.0–36.0)
MCV: 93.8 fL (ref 80.0–100.0)
Platelets: 69 10*3/uL — ABNORMAL LOW (ref 150–400)
RBC: 3.38 MIL/uL — ABNORMAL LOW (ref 3.87–5.11)
RDW: 15.5 % (ref 11.5–15.5)
WBC: 4.4 10*3/uL (ref 4.0–10.5)
nRBC: 0 % (ref 0.0–0.2)

## 2018-12-12 LAB — PHOSPHORUS: Phosphorus: 2.6 mg/dL (ref 2.5–4.6)

## 2018-12-12 LAB — CREATININE, URINE, RANDOM: Creatinine, Urine: 200.29 mg/dL

## 2018-12-12 LAB — ECHOCARDIOGRAM COMPLETE
Height: 66 in
Weight: 2880 oz

## 2018-12-12 LAB — TSH: TSH: <0.01 u[IU]/mL — ABNORMAL LOW (ref 0.350–4.500)

## 2018-12-12 LAB — MAGNESIUM: Magnesium: 1.9 mg/dL (ref 1.7–2.4)

## 2018-12-12 LAB — TROPONIN I (HIGH SENSITIVITY): Troponin I (High Sensitivity): 8 ng/L (ref <18)

## 2018-12-12 LAB — T4, FREE: Free T4: 2.36 ng/dL — ABNORMAL HIGH (ref 0.61–1.12)

## 2018-12-12 LAB — SODIUM, URINE, RANDOM: Sodium, Ur: 57 mmol/L

## 2018-12-12 MED ORDER — SODIUM CHLORIDE (PF) 0.9 % IJ SOLN
INTRAMUSCULAR | Status: AC
  Filled 2018-12-12: qty 50

## 2018-12-12 MED ORDER — BOOST / RESOURCE BREEZE PO LIQD CUSTOM: 1.0000 | Freq: Three times a day (TID) | ORAL | Status: DC

## 2018-12-12 MED ORDER — ENSURE ENLIVE PO LIQD
237.0000 mL | Freq: Two times a day (BID) | ORAL | Status: DC
  Administered 2018-12-12 – 2018-12-17 (×10): 237 mL via ORAL

## 2018-12-12 MED ORDER — IOHEXOL 300 MG/ML  SOLN
100.0000 mL | Freq: Once | INTRAMUSCULAR | Status: AC | PRN
  Administered 2018-12-12: 100 mL via INTRAVENOUS

## 2018-12-12 MED ORDER — IOHEXOL 300 MG/ML  SOLN: 15.0000 mL | Freq: Once | INTRAMUSCULAR | Status: DC | PRN

## 2018-12-12 MED ORDER — LEVOTHYROXINE SODIUM 25 MCG PO TABS
25.0000 ug | ORAL_TABLET | Freq: Every day | ORAL | Status: DC
  Administered 2018-12-12 – 2018-12-16 (×4): 25 ug via ORAL
  Filled 2018-12-12 (×5): qty 1

## 2018-12-12 NOTE — Progress Notes (Signed)
HEMATOLOGY-ONCOLOGY PROGRESS NOTE  SUBJECTIVE: The patient is readmitted with syncopal episodes, nausea, and vomiting.  States that she becomes very lightheaded when she stands up.  Feels as though she is going to "blackout".  States that she never really felt fully better after her last hospitalization.  She remains on Tagrisso 160 mg daily.  Denies skin rash and diarrhea.  Denies chest discomfort or shortness of breath.  The patient was due for restaging CT scan of the chest, abdomen, pelvis earlier this week but missed her appointment.  Oncology History Overview Note  Patient presented with sycopal episode to ED.  Work up showed LUL mass.  Malignant neoplasm of upper lobe of left lung   Primary site: Lung (Left)   Staging method: AJCC 7th Edition   Clinical: Stage IV (T1b, N3, M1b) signed by Curt Bears, MD on 11/15/2013 12:39 PM   Summary: Stage IV (T1b, N3, M1b)     Malignant neoplasm of upper lobe of left lung ([place])  10/27/2013 Pathology Results   Transbronchial biopsy LUL lung adenocarcinoma   10/27/2013 Surgery   Bronchoscopy   11/06/2013 Imaging   MRI Brain with and without contrast impression no acute or metastatic intracrainial abnoramlity.     11/13/2013 Initial Diagnosis   Malignant neoplasm of upper lobe of left lung   11/24/2013 Tumor Marker   Foundation One Genomic Alterations Identified EGFR amplification, [id number], [id number]*   Non-small cell carcinoma of lung, stage 4 ([facility])  04/07/2015 Initial Diagnosis   Non-small cell carcinoma of lung, stage 4 ([place])   02/21/2018 -  Chemotherapy   The patient had palonosetron (ALOXI) injection 0.25 mg, 0.25 mg, Intravenous,  Once, 4 of 4 cycles Administration: 0.25 mg (02/21/2018), 0.25 mg (03/21/2018), 0.25 mg (04/11/2018), 0.25 mg (05/02/2018) pegfilgrastim (NEULASTA) injection 6 mg, 6 mg, Subcutaneous, Once, 4 of 4 cycles Administration: 6 mg (02/23/2018), 6 mg (03/23/2018), 6 mg (04/13/2018), 6 mg (05/04/2018) bevacizumab (AVASTIN)  1,500 mg in sodium chloride 0.9 % 100 mL chemo infusion, 15.3 mg/kg = 1,475 mg (100 % of original dose 15 mg/kg), Intravenous, Every 21 days, 10 of 14 cycles Dose modification: 15 mg/kg (original dose 15 mg/kg, Cycle 1) Administration: 1,500 mg (02/21/2018), 1,500 mg (03/21/2018), 1,500 mg (04/11/2018), 1,400 mg (05/02/2018), 1,400 mg (06/01/2018), 1,400 mg (06/20/2018), 1,400 mg (07/11/2018), 1,400 mg (07/31/2018), 1,400 mg (08/21/2018), 1,400 mg (09/11/2018) CARBOplatin (PARAPLATIN) 660 mg in sodium chloride 0.9 % 250 mL chemo infusion, 660 mg (100 % of original dose 655.2 mg), Intravenous,  Once, 4 of 4 cycles Dose modification: 655.2 mg (original dose 655.2 mg, Cycle 1), 655.2 mg (original dose 655.2 mg, Cycle 2), 655.2 mg (original dose 655.2 mg, Cycle 3), 540 mg (original dose 540 mg, Cycle 4) Administration: 660 mg (02/21/2018), 660 mg (03/21/2018), 660 mg (04/11/2018), 540 mg (05/02/2018) PACLitaxel (TAXOL) 426 mg in sodium chloride 0.9 % 500 mL chemo infusion (> '80mg'$ /m2), 200 mg/m2 = 426 mg, Intravenous,  Once, 4 of 4 cycles Dose modification: 150 mg/m2 (original dose 200 mg/m2, Cycle 4, Reason: Dose not tolerated) Administration: 426 mg (02/21/2018), 426 mg (03/21/2018), 426 mg (04/11/2018), 318 mg (05/02/2018) fosaprepitant (EMEND) 150 mg, dexamethasone (DECADRON) 12 mg in sodium chloride 0.9 % 145 mL IVPB, , Intravenous,  Once, 4 of 4 cycles Administration:  (02/21/2018),  (03/21/2018),  (04/11/2018),  (05/02/2018) atezolizumab (TECENTRIQ) 1,200 mg in sodium chloride 0.9 % 250 mL chemo infusion, 1,200 mg, Intravenous, Once, 10 of 14 cycles Administration: 1,200 mg (02/21/2018), 1,200 mg (03/21/2018), 1,200 mg (04/11/2018), 1,200 mg (05/02/2018),  1,200 mg (06/01/2018), 1,200 mg (06/20/2018), 1,200 mg (07/11/2018), 1,200 mg (07/31/2018), 1,200 mg (08/21/2018), 1,200 mg (09/11/2018)  for chemotherapy treatment.     REVIEW OF SYSTEMS:   Constitutional: Denies fevers, chills  Respiratory: Denies cough, dyspnea or  wheezes Cardiovascular: Denies palpitation, chest discomfort Gastrointestinal: Reports intermittent nausea and vomiting.  Denies diarrhea. Skin: Denies abnormal skin rashes Lymphatics: Denies new lymphadenopathy or easy bruising Neurological: Reports generalized weakness and syncopal episodes. Behavioral/Psych: Mood is stable, no new changes  Extremities: No lower extremity edema All other systems were reviewed with the patient and are negative.  I have reviewed the past medical history, past surgical history, social history and family history with the patient and they are unchanged from previous note.   PHYSICAL EXAMINATION: ECOG PERFORMANCE STATUS: 2 - Symptomatic, <50% confined to bed  Vitals:   12/11/18 2327 12/12/18 0403  BP: 112/61 129/70  Pulse: (!) 108 92  Resp: 18 18  Temp: 97.9 F (36.6 C) 97.9 F (36.6 C)  SpO2: 100% 100%   Filed Weights   12/11/18 1905  Weight: 180 lb (81.6 kg)    Intake/Output from previous day: 07/15 0701 - 07/16 0700 In: 1281.4 [I.V.:782.9; IV Piggyback:498.4] Out: 400 [Urine:400]  GENERAL:alert, no distress.  Chronically ill-appearing. NECK: supple, thyroid normal size, non-tender, without nodularity LYMPH:  no palpable lymphadenopathy in the cervical, axillary or inguinal LUNGS: clear to auscultation and percussion with normal breathing effort HEART: regular rate & rhythm and no murmurs and no lower extremity edema ABDOMEN:abdomen soft, non-tender and normal bowel sounds Musculoskeletal:no cyanosis of digits and no clubbing  NEURO: alert & oriented x 3 with fluent speech, no focal motor/sensory deficits  LABORATORY DATA:  I have reviewed the data as listed CMP Latest Ref Rng & Units 12/12/2018 12/11/2018 11/20/2018  Glucose 70 - 99 mg/dL 102(H) 154(H) 93  BUN 8 - 23 mg/dL 27(H) 33(H) 6(L)  Creatinine 0.44 - 1.00 mg/dL 0.77 1.33(H) 0.53  Sodium 135 - 145 mmol/L 136 139 138  Potassium 3.5 - 5.1 mmol/L 3.8 2.8(L) 3.8  Chloride 98 - 111  mmol/L 101 95(L) 105  CO2 22 - 32 mmol/L '29 30 26  '$ Calcium 8.9 - 10.3 mg/dL 7.6(L) 8.6(L) 8.0(L)  Total Protein 6.5 - 8.1 g/dL 5.4(L) - -  Total Bilirubin 0.3 - 1.2 mg/dL 1.1 - -  Alkaline Phos 38 - 126 U/L 79 - -  AST 15 - 41 U/L 24 - -  ALT 0 - 44 U/L 11 - -    Lab Results  Component Value Date   WBC 4.4 12/12/2018   HGB 10.0 (L) 12/12/2018   HCT 31.7 (L) 12/12/2018   MCV 93.8 12/12/2018   PLT 69 (L) 12/12/2018   NEUTROABS 2.1 10/29/2018    Mr Brain W Wo Contrast  Result Date: 11/18/2018 CLINICAL DATA:  68 year old female with non-small cell lung cancer, evidence of widespread leptomeningeal tumor on brain MRI in April. Status post whole brain radiation in May. EXAM: MRI HEAD WITHOUT AND WITH CONTRAST TECHNIQUE: Multiplanar, multiecho pulse sequences of the brain and surrounding structures were obtained without and with intravenous contrast. CONTRAST:  7 milliliters Gadavist COMPARISON:  Chat in hospital brain MRI 09/20/2018. FINDINGS: Brain: Stable cerebral volume. No restricted diffusion to suggest acute infarction. No midline shift, mass effect, ventriculomegaly, extra-axial collection or acute intracranial hemorrhage. Cervicomedullary junction and pituitary are within normal limits. Regression of scattered abnormal T2 and FLAIR hyperintensity which was associated with the predominantly left 0 meningeal appearing enhancing metastatic disease in  April. Accordingly, virtually all of the areas of enhancing metastatic disease demonstrated on 09/21/2018 have regressed. There are 1 or 2 punctate areas of enhancement in the cerebellum which appear stable (such as in the left hemisphere on series 11, image 17). No new brain metastasis is identified. Vascular: Major intracranial vascular flow voids are stable. The major dural venous sinuses are enhancing and appear to be patent. Skull and upper cervical spine: Negative visible cervical spine and spinal cord. Stable bone marrow signal, within normal  limits. Sinuses/Orbits: Negative orbits. Paranasal sinuses and mastoids are stable and well pneumatized. Other: Visible internal auditory structures appear normal. Scalp and face soft tissues appear negative. IMPRESSION: 1. Regression of metastatic disease to the brain following whole brain radiation. 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 2. No new intracranial abnormality. Electronically Signed   By: Genevie Ann M.D.   On: 11/18/2018 13:21    ASSESSMENT AND PLAN: 1. 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 Syncope 4. Nausea and vomiting 5. Dehydration 6. History of DVT 7.  Anemia and thrombocytopenia secondary to chemotherapy  -Continue Tagrisso 160 mg daily. -We will obtain a restaging CT scan of the chest, abdomen, pelvis during this admission. -Recent MRI of the brain performed on 11/18/2018 showed regression of her metastatic disease to the brain following whole brain radiation.  We will hold off on a repeat MRI of the brain at this time unless she develops new or worsening neurological symptoms. -IV fluids and antiemetics per hospitalist. -Continue Eliquis.  -Recommend daily CBC. Transfuse PRBC for hemoglobin <7.0 or platelets <20,000 or active bleeding. No transfusion indicated today.    LOS: 0 days   Mikey Bussing, DNP, AGPCNP-BC, AOCNP 12/12/18

## 2018-12-12 NOTE — Plan of Care (Signed)
Pt denies pain and nausea at this time.

## 2018-12-12 NOTE — Progress Notes (Signed)
MD ordered orthostatic vital signs. RN discussed with the Patient and Pt states "I don't think I can do that I have felt very dizzy and weak at home. When I did get in my wheelchair recently at home I felt like I was passing out" MD made aware

## 2018-12-12 NOTE — Progress Notes (Signed)
[facility]                                                                              Patient Demographics  Debra Hodges, is a 68 y.o. female, DOB - April 10, 1951, JJH:[id number]  Admit date - 12/11/2018   Admitting Physician Toy Baker, MD  Outpatient Primary MD for the patient is [place] [facility]:   LOS - 0  days   Medical records reviewed and are as summarized below:    Chief Complaint  Patient presents with   Near Syncope       Brief summary   Patient is a 68 year old female with history of metastatic non-small cell lung cancer, metastatic to liver, brain on oral chemotherapy, hypertension, DVT since 2019, on Eliquis presented with near syncope, dizziness and lightheadedness when she tries to stand up.  The episodes are associated with worsening nausea and vomiting.  No fevers or chills, chest pain or shortness of breath.  Patient states she gets very lightheaded when she tries to stand up from her chair and has to sit back down very quickly.  She has history of syncope in the past and was diagnosed with mets to the brain now finished radiation therapy. Patient reported 3 weeks of intermittent vomiting of brown fluid, denies any frank blood, no hematochezia or melena.  Reported hard time tolerating her medications secondary to persistent nausea and vomiting.  COVID-19 test negative   Assessment & Plan    Principal Problem: Near syncope: -Likely secondary to orthostasis, dehydration, intractable nausea and vomiting. -Continue IV fluids, still feels very dizzy and lightheaded on sitting up - will continue IV fluids, decrease rate to 75 cc an hour  Active Problems:  Intractable nausea and vomiting -Feels nausea slightly better, wants to try full liquid diet. 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  Follow CT scans, possible DC home in a.m. if improving.  Time Spent in minutes  32mins   Procedures:  None  Consultants:   Oncology  Antimicrobials:   Anti-infectives (From admission, onward)   None         Medications  Scheduled Meds:  apixaban  5 mg Oral BID   feeding supplement  1  Container Oral TID BM   feeding supplement (ENSURE ENLIVE)  237 mL Oral BID BM   gabapentin  300 mg Oral TID   levothyroxine  25 mcg Oral QHS   osimertinib mesylate  160 mg Oral Daily   potassium chloride SA  20 mEq Oral QHS   Continuous Infusions:  sodium chloride 125 mL/hr at 12/12/18 0731   PRN Meds:.acetaminophen **OR** acetaminophen, iohexol, ondansetron (ZOFRAN) IV, ondansetron **OR** ondansetron (ZOFRAN) IV      Subjective:   Debra Hodges was seen and examined today.  Feels dizzy and lightheaded on sitting up, no vomiting episodes this morning, wants to try full liquids.  Patient denies chest pain, shortness of breath, abdominal pain.  No fevers  Objective:   Vitals:   12/11/18  2200 12/11/18 2230 12/11/18 2327 12/12/18 0403  BP: 130/62 126/71 112/61 129/70  Pulse: (!) 110 99 (!) 108 92  Resp: (!) 22 17 18 18   Temp:   97.9 F (36.6 C) 97.9 F (36.6 C)  TempSrc:   Oral Oral  SpO2: 98% 99% 100% 100%  Weight:      Height:        Intake/Output Summary (Last 24 hours) at 12/12/2018 1206 Last data filed at 12/12/2018 1200 Gross per 24 hour  Intake 1281.38 ml  Output 600 ml  Net 681.38 ml     Wt Readings from Last 3 Encounters:  12/11/18 81.6 kg  11/18/18 75.7 kg  10/15/18 81.5 kg     Exam  General: Alert and oriented x 3, NAD, frail and ill-appearing  Eyes:   HEENT:  Atraumatic, normocephalic, normal oropharynx  Cardiovascular: S1 S2 auscultated,  Regular rate and rhythm.  Respiratory: Clear to auscultation bilaterally, no wheezing, rales or rhonchi  Gastrointestinal: Soft, nontender, nondistended, + bowel sounds  Ext: no pedal edema bilaterally  Neuro: No new deficits  Musculoskeletal: No digital cyanosis, clubbing  Skin: No rashes  Psych: Normal affect and demeanor, alert and oriented x3    Data Reviewed:  I have personally reviewed following labs and imaging studies  Micro Results Recent Results (from the past 240 hour(s))  SARS Coronavirus 2 (CEPHEID - Performed in [place] hospital lab), Hosp Order     Status: None   Collection Time: 12/11/18  9:55 PM   Specimen: Nasopharyngeal Swab  Result Value Ref Range Status   SARS Coronavirus 2 NEGATIVE NEGATIVE Final    Comment: (NOTE) If result is NEGATIVE SARS-CoV-2 target nucleic acids are NOT DETECTED. The SARS-CoV-2 RNA is generally detectable in upper and lower  respiratory specimens during the acute phase of infection. The lowest  concentration of SARS-CoV-2 viral copies this assay can detect is 250  copies / mL. A negative result does not preclude SARS-CoV-2 infection  and should not be used as the sole basis for treatment or other  patient management decisions.  A  negative result may occur with  improper specimen collection / handling, submission of specimen other  than nasopharyngeal swab, presence of viral mutation(s) within the  areas targeted by this assay, and inadequate number of viral copies  (<250 copies / mL). A negative result must be combined with clinical  observations, patient history, and epidemiological information. If result is POSITIVE SARS-CoV-2 target nucleic acids are DETECTED. The SARS-CoV-2 RNA is generally detectable in upper and lower  respiratory specimens dur ing the acute phase of infection.  Positive  results are indicative of active infection with SARS-CoV-2.  Clinical  correlation with patient history and other diagnostic  information is  necessary to determine patient infection status.  Positive results do  not rule out bacterial infection or co-infection with other viruses. If result is PRESUMPTIVE POSTIVE SARS-CoV-2 nucleic acids MAY BE PRESENT.   A presumptive positive result was obtained on the submitted specimen  and confirmed on repeat testing.  While 2019 novel coronavirus  (SARS-CoV-2) nucleic acids may be present in the submitted sample  additional confirmatory testing may be necessary for epidemiological  and / or clinical management purposes  to differentiate between  SARS-CoV-2 and other Sarbecovirus currently known to infect humans.  If clinically indicated additional testing with an alternate test  methodology [phone]) is advised. The SARS-CoV-2 RNA is generally  detectable in upper and lower respiratory sp ecimens during the acute  phase of infection. The expected result is Negative. Fact Sheet for Patients:  StrictlyIdeas.no Fact Sheet for Healthcare Providers: BankingDealers.co.za This test is not yet approved or cleared by the [place] FDA and has been authorized for detection and/or diagnosis of SARS-CoV-2 by FDA under an Emergency Use  Authorization (EUA).  This EUA will remain in effect (meaning this test can be used) for the duration of the COVID-19 declaration under Section 564(b)(1) of the Act, 21 U.S.C. section 360bbb-3(b)(1), unless the authorization is terminated or revoked sooner. 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 Status post whole brain radiation in May. EXAM: MRI HEAD WITHOUT AND WITH CONTRAST TECHNIQUE: Multiplanar, multiecho pulse sequences of the brain and surrounding structures were obtained without and with intravenous contrast. CONTRAST:  7 milliliters Gadavist COMPARISON:  Chat in hospital brain MRI 09/20/2018. FINDINGS: Brain: Stable cerebral volume. No restricted diffusion to suggest acute infarction. No midline shift, mass effect, ventriculomegaly, extra-axial collection or acute intracranial hemorrhage. Cervicomedullary junction and pituitary are within normal limits. Regression of scattered abnormal T2 and FLAIR hyperintensity which was associated with the predominantly left 0 meningeal appearing enhancing metastatic disease in April. Accordingly, virtually all of the areas of enhancing metastatic disease demonstrated on 09/21/2018 have regressed. There are 1 or 2 punctate areas of enhancement in the cerebellum which appear stable (such as in the left hemisphere on series 11, image 17). No new brain metastasis is identified. Vascular: Major intracranial vascular flow voids are stable. The major dural venous sinuses are enhancing and appear to be patent. Skull and upper cervical spine: Negative visible cervical spine and spinal cord. Stable bone marrow signal, within normal limits. Sinuses/Orbits: Negative orbits. Paranasal sinuses and mastoids are stable and  well pneumatized. Other: Visible internal auditory structures appear normal. Scalp and face soft tissues appear negative. IMPRESSION: 1. Regression of metastatic disease to the brain following whole brain radiation. 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 2. No new intracranial abnormality. Electronically Signed   By: Genevie Ann M.D.   On: 11/18/2018 13:21    Lab Data:  CBC: Recent Labs  Lab 12/11/18 1907 12/12/18 0353  WBC 5.5 4.4  HGB 11.6* 10.0*  HCT 35.8* 31.7*  MCV 92.5 93.8  PLT 98* 69*   Basic Metabolic Panel: Recent Labs  Lab 12/11/18 1907 12/11/18 2146 12/12/18 0353  NA 139  --  136  K 2.8*  --  3.8  CL 95*  --  101  CO2 30  --  29  GLUCOSE 154*  --  102*  BUN 33*  --  27*  CREATININE 1.33*  --  0.77  CALCIUM 8.6*  --  7.6*  MG  --  1.9 1.9  PHOS  --  3.7 2.6   GFR: Estimated Creatinine Clearance: 72.5 mL/min (by C-G formula based on SCr of 0.77 mg/dL). Liver Function Tests: Recent Labs  Lab 12/12/18 0353  AST 24  ALT 11  ALKPHOS 79  BILITOT 1.1  PROT 5.4*  ALBUMIN 2.5*   No results for input(s): LIPASE, AMYLASE in the last 168 hours. No results for input(s): AMMONIA in the last 168 hours. Coagulation Profile: No results for input(s): INR, PROTIME in the last 168 hours. Cardiac Enzymes: No results for input(s): CKTOTAL, CKMB, CKMBINDEX, TROPONINI in the last 168 hours. BNP (last 3 results) No results for input(s): PROBNP in the last 8760 hours. HbA1C: No results for input(s): HGBA1C in the last 72 hours. CBG: Recent Labs  Lab 12/11/18 1920  GLUCAP 150*   Lipid Profile: No results for input(s): CHOL, HDL, LDLCALC, TRIG, CHOLHDL, LDLDIRECT in the last 72 hours. Thyroid Function Tests: Recent Labs    12/12/18 0353  TSH <0.010*  FREET4 2.36*   Anemia Panel: No results for input(s): VITAMINB12, FOLATE, FERRITIN, TIBC, IRON, RETICCTPCT in the last 72 hours. Urine analysis:      Component Value Date/Time   COLORURINE AMBER (A) 12/12/2018 0414   APPEARANCEUR HAZY (A) 12/12/2018 0414   LABSPEC 1.017 12/12/2018 0414   PHURINE 6.0 12/12/2018 0414   GLUCOSEU NEGATIVE 12/12/2018 0414   HGBUR LARGE (A) 12/12/2018 0414   BILIRUBINUR NEGATIVE 12/12/2018 [place] 12/12/2018 0414   PROTEINUR 30 (A) 12/12/2018 0414   NITRITE NEGATIVE 12/12/2018 0414   LEUKOCYTESUR MODERATE (A) 12/12/2018 0414     Sava Proby M.D. [facility] 12/12/2018, 12:06 PM  Pager: [phone] Between 7am to 7pm - call Pager - [phone]  After 7pm go to [email or web address] - password [email or web address]  Call night coverage person covering after 7pm

## 2018-12-13 LAB — BASIC METABOLIC PANEL
Anion gap: 7 (ref 5–15)
BUN: 10 mg/dL (ref 8–23)
CO2: 24 mmol/L (ref 22–32)
Calcium: 7.8 mg/dL — ABNORMAL LOW (ref 8.9–10.3)
Chloride: 109 mmol/L (ref 98–111)
Creatinine, Ser: 0.52 mg/dL (ref 0.44–1.00)
GFR calc Af Amer: >60 mL/min (ref >60)
GFR calc non Af Amer: >60 mL/min (ref >60)
Glucose, Bld: 85 mg/dL (ref 70–99)
Potassium: 3.8 mmol/L (ref 3.5–5.1)
Sodium: 140 mmol/L (ref 135–145)

## 2018-12-13 LAB — GLUCOSE, CAPILLARY: Glucose-Capillary: 98 mg/dL (ref 70–99)

## 2018-12-13 LAB — T3: T3, Total: 143 ng/dL (ref 71–180)

## 2018-12-13 NOTE — Progress Notes (Signed)
HEMATOLOGY-ONCOLOGY PROGRESS NOTE  SUBJECTIVE: Feels better today. Vomited several times yesterday. None today. Hoping to discharge over the weekend. She is concerned about her overall generalized weakness.   Oncology History Overview Note  Patient presented with sycopal episode to ED.  Work up showed LUL mass.  Malignant neoplasm of upper lobe of left lung   Primary site: Lung (Left)   Staging method: AJCC 7th Edition   Clinical: Stage IV (T1b, N3, M1b) signed by Curt Bears, MD on 11/15/2013 12:39 PM   Summary: Stage IV (T1b, N3, M1b)     Malignant neoplasm of upper lobe of left lung ([place])  10/27/2013 Pathology Results   Transbronchial biopsy LUL lung adenocarcinoma   10/27/2013 Surgery   Bronchoscopy   11/06/2013 Imaging   MRI Brain with and without contrast impression no acute or metastatic intracrainial abnoramlity.     11/13/2013 Initial Diagnosis   Malignant neoplasm of upper lobe of left lung   11/24/2013 Tumor Marker   [employer] Genomic Alterations Identified EGFR amplification, [id number], [id number]   Non-small cell carcinoma of lung, stage 4 (HCC)  04/07/2015 Initial Diagnosis   Non-small cell carcinoma of lung, stage 4 ([place])   02/21/2018 -  Chemotherapy   The patient had palonosetron (ALOXI) injection 0.25 mg, 0.25 mg, Intravenous,  Once, 4 of 4 cycles Administration: 0.25 mg (02/21/2018), 0.25 mg (03/21/2018), 0.25 mg (04/11/2018), 0.25 mg (05/02/2018) pegfilgrastim (NEULASTA) injection 6 mg, 6 mg, Subcutaneous, Once, 4 of 4 cycles Administration: 6 mg (02/23/2018), 6 mg (03/23/2018), 6 mg (04/13/2018), 6 mg (05/04/2018) bevacizumab (AVASTIN) 1,500 mg in sodium chloride 0.9 % 100 mL chemo infusion, 15.3 mg/kg = 1,475 mg (100 % of original dose 15 mg/kg), Intravenous, Every 21 days, 10 of 14 cycles Dose modification: 15 mg/kg (original dose 15 mg/kg, Cycle 1) Administration: 1,500 mg (02/21/2018), 1,500 mg (03/21/2018), 1,500 mg (04/11/2018), 1,400 mg (05/02/2018), 1,400 mg  (06/01/2018), 1,400 mg (06/20/2018), 1,400 mg (07/11/2018), 1,400 mg (07/31/2018), 1,400 mg (08/21/2018), 1,400 mg (09/11/2018) CARBOplatin (PARAPLATIN) 660 mg in sodium chloride 0.9 % 250 mL chemo infusion, 660 mg (100 % of original dose 655.2 mg), Intravenous,  Once, 4 of 4 cycles Dose modification: 655.2 mg (original dose 655.2 mg, Cycle 1), 655.2 mg (original dose 655.2 mg, Cycle 2), 655.2 mg (original dose 655.2 mg, Cycle 3), 540 mg (original dose 540 mg, Cycle 4) Administration: 660 mg (02/21/2018), 660 mg (03/21/2018), 660 mg (04/11/2018), 540 mg (05/02/2018) PACLitaxel (TAXOL) 426 mg in sodium chloride 0.9 % 500 mL chemo infusion (> 29m/m2), 200 mg/m2 = 426 mg, Intravenous,  Once, 4 of 4 cycles Dose modification: 150 mg/m2 (original dose 200 mg/m2, Cycle 4, Reason: Dose not tolerated) Administration: 426 mg (02/21/2018), 426 mg (03/21/2018), 426 mg (04/11/2018), 318 mg (05/02/2018) fosaprepitant (EMEND) 150 mg, dexamethasone (DECADRON) 12 mg in sodium chloride 0.9 % 145 mL IVPB, , Intravenous,  Once, 4 of 4 cycles Administration:  (02/21/2018),  (03/21/2018),  (04/11/2018),  (05/02/2018) atezolizumab (TECENTRIQ) 1,200 mg in sodium chloride 0.9 % 250 mL chemo infusion, 1,200 mg, Intravenous, Once, 10 of 14 cycles Administration: 1,200 mg (02/21/2018), 1,200 mg (03/21/2018), 1,200 mg (04/11/2018), 1,200 mg (05/02/2018), 1,200 mg (06/01/2018), 1,200 mg (06/20/2018), 1,200 mg (07/11/2018), 1,200 mg (07/31/2018), 1,200 mg (08/21/2018), 1,200 mg (09/11/2018)  for chemotherapy treatment.     REVIEW OF SYSTEMS:   Constitutional: Denies fevers, chills. Has generalized weakness.   Respiratory: Denies cough, dyspnea or wheezes Cardiovascular: Denies palpitation, chest discomfort Gastrointestinal: Reports intermittent nausea and vomiting.  Denies diarrhea. Skin: Denies  abnormal skin rashes Lymphatics: Denies new lymphadenopathy or easy bruising Neurological: Reports generalized weakness and syncopal  episodes. Behavioral/Psych: Mood is stable, no new changes  Extremities: No lower extremity edema All other systems were reviewed with the patient and are negative.  I have reviewed the past medical history, past surgical history, social history and family history with the patient and they are unchanged from previous note.   PHYSICAL EXAMINATION: ECOG PERFORMANCE STATUS: 2 - Symptomatic, <50% confined to bed  Vitals:   12/13/18 0459 12/13/18 1132  BP: 128/61 116/86  Pulse: 89 100  Resp: 20 16  Temp: 98.6 F (37 C) 98.5 F (36.9 C)  SpO2: 98% 98%   Filed Weights   12/11/18 1905  Weight: 180 lb (81.6 kg)    Intake/Output from previous day: 07/16 0701 - 07/17 0700 In: 2626.4 [P.O.:520; I.V.:2106.4] Out: 200 [Urine:200]  GENERAL:alert, no distress.  Chronically ill-appearing.  LABORATORY DATA:  I have reviewed the data as listed CMP Latest Ref Rng & Units 12/13/2018 12/12/2018 12/11/2018  Glucose 70 - 99 mg/dL 85 102(H) 154(H)  BUN 8 - 23 mg/dL 10 27(H) 33(H)  Creatinine 0.44 - 1.00 mg/dL 0.52 0.77 1.33(H)  Sodium 135 - 145 mmol/L 140 136 139  Potassium 3.5 - 5.1 mmol/L 3.8 3.8 2.8(L)  Chloride 98 - 111 mmol/L 109 101 95(L)  CO2 22 - 32 mmol/L _0 Calcium 8.9 - 10.3 mg/dL 7.8(L) 7.6(L) 8.6(L)  Total Protein 6.5 - 8.1 g/dL - 5.4(L) -  Total Bilirubin 0.3 - 1.2 mg/dL - 1.1 -  Alkaline Phos 38 - 126 U/L - 79 -  AST 15 - 41 U/L - 24 -  ALT 0 - 44 U/L - 11 -    Lab Results  Component Value Date   WBC 4.4 12/12/2018   HGB 10.0 (L) 12/12/2018   HCT 31.7 (L) 12/12/2018   MCV 93.8 12/12/2018   PLT 69 (L) 12/12/2018   NEUTROABS 2.1 10/29/2018    Ct Chest W Contrast  Result Date: 12/12/2018 CLINICAL DATA:  Metastatic non-small cell lung cancer restaging. Syncope. Intermittent vomiting. EXAM: CT CHEST, ABDOMEN, AND PELVIS WITH CONTRAST TECHNIQUE: Multidetector CT imaging of the chest, abdomen and pelvis was performed following the standard protocol during bolus  administration of intravenous contrast. CONTRAST:  167m OMNIPAQUE IOHEXOL 300 MG/ML  SOLN COMPARISON:  Multiple exams, including 09/09/2018 FINDINGS: CT CHEST FINDINGS Cardiovascular: Atherosclerotic calcification of the aortic arch branch vessels. Mediastinum/Nodes: Calcified right anterior thyroid nodule, stable. Lungs/Pleura: Biapical pleuroparenchymal scarring. Similar morphology of left perihilar radiation therapy related findings including consolidation in the left upper lobe and left lower lobe perihilar regions as well as volume loss. Left suprahilar consolidation/nodularity is stable at 2.1 cm transverse on image 48/7. Stable 3 mm left upper lobe nodule on image 36/7. Musculoskeletal: Thoracic spondylosis. CT ABDOMEN PELVIS FINDINGS Hepatobiliary: Segment 2 hypodense lesion 1.4 by 1.1 cm, formerly 1.8 by 1.5 cm. Hypodense right hepatic lobe lesion on image 40/2 measures 1.1 by 1.1 cm, formerly 1.4 by 1.3 cm. In segment 4 of the liver, a 2.0 by 1.7 cm lesion on image 40/2 was previously indistinct but less striking, probably measuring about 1.8 by 1.5 cm. Additional indistinct hypodense lesion in segment 3 is roughly stable. Gallbladder unremarkable.  No biliary dilatation. Pancreas: Unremarkable Spleen: Stable fluid density lesion of the splenic hilum on image 50/2, most likely benign. Adrenals/Urinary Tract: Both adrenal glands appear normal. 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 Small hypodense  lesion of the right mid kidney 2 mm right mid kidney nonobstructive renal calculus. 5 mm left mid to lower kidney nonobstructive renal calculus. Stomach/Bowel: Air-fluid levels in the distal colon suggesting diarrheal process. Otherwise unremarkable. Vascular/Lymphatic: Aortoiliac atherosclerotic vascular disease. Reproductive: Uterus absent.  Adnexa unremarkable. Other: 2 metal clips are noted along the porta hepatis, possibly fiducials, correlate with patient history. Musculoskeletal: Grade  1 degenerative anterolisthesis at L4-5 with associated degenerative facet arthropathy. IMPRESSION: 1. Mixed appearance of the liver metastatic lesions, with 2 of the hypodense lesions appearing smaller, but 1 of the hypodense lesions appearing slightly larger. 2. Stable appearance of primarily therapy related findings around the left perihilar region. Any underlying tumor hidden by the surrounding post therapy related findings has not progressed in the interim. 3. Air fluid levels in the colon suggests diarrheal process. 4. Other imaging findings of potential clinical significance: Aortic Atherosclerosis (ICD10-I70.0). Stable right thyroid nodule. 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 Bilateral nonobstructive nephrolithiasis. Electronically Signed   By: Van Clines M.D.   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 Status post whole brain radiation in May. EXAM: MRI HEAD WITHOUT AND WITH CONTRAST TECHNIQUE: Multiplanar, multiecho pulse sequences of the brain and surrounding structures were obtained without and with intravenous contrast. CONTRAST:  7 milliliters Gadavist COMPARISON:  Chat in hospital brain MRI 09/20/2018. FINDINGS: Brain: Stable cerebral volume. No restricted diffusion to suggest acute infarction. No midline shift, mass effect, ventriculomegaly, extra-axial collection or acute intracranial hemorrhage. Cervicomedullary junction and pituitary are within normal limits. Regression of scattered abnormal T2 and FLAIR hyperintensity which was associated with the predominantly left 0 meningeal appearing enhancing metastatic disease in April. Accordingly, virtually all of the areas of enhancing metastatic disease demonstrated on 09/21/2018 have regressed. There are 1 or 2 punctate areas of enhancement in the cerebellum which appear  stable (such as in the left hemisphere on series 11, image 17). No new brain metastasis is identified. Vascular: Major intracranial vascular flow voids are stable. The major dural venous sinuses are enhancing and appear to be patent. Skull and upper cervical spine: Negative visible cervical spine and spinal cord. Stable bone marrow signal, within normal limits. Sinuses/Orbits: Negative orbits. Paranasal sinuses and mastoids are stable and well pneumatized. Other: Visible internal auditory structures appear normal. Scalp and face soft tissues appear negative. IMPRESSION: 1. Regression of metastatic disease to the brain following whole brain radiation. 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 2. No new intracranial abnormality. Electronically Signed   By: Genevie Ann M.D.   On: 11/18/2018 13:21   Ct Abdomen Pelvis W Contrast  Result Date: 12/12/2018 : Please see accession [id number] for the complete report of CT chest, abdomen, and pelvis with contrast. Electronically Signed   By: Van Clines M.D.   On: 12/12/2018 15:27    ASSESSMENT AND PLAN: 1. 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 Syncope 4. Nausea and vomiting 5. Dehydration 6. History of DVT 7. Anemia and thrombocytopenia secondary to chemotherapy  -Restaging CT scan of the chest, abdomen, pelvis obtained. CT scan results reviewed. Overall, the scans show stable to improved disease. One liver lesion is only slightly  bigger, but not enough to warrant a treatment change. She is scheduled to see Dr. Julien Nordmann on 12/19/2018 and will have a more detailed discussion about her scan results at that visit.  -Continue Tagrisso 160 mg daily. -Recent MRI of the brain performed on 11/18/2018 showed  regression of her metastatic disease to the brain following whole brain radiation.  No need for a repeat MRI of the brain at this time unless she develops new or worsening neurological symptoms. -IV fluids and antiemetics per hospitalist. -Continue Eliquis.  -Recommend daily CBC. Transfuse PRBC for hemoglobin <7.0 or platelets <20,000 or active bleeding. CBC not repeated this morning.     LOS: 1 day   Mikey Bussing, DNP, AGPCNP-BC, AOCNP 12/13/18

## 2018-12-13 NOTE — Plan of Care (Signed)
Pt still weak and unable to ambulate. Pt had a poor appetite this shift.

## 2018-12-13 NOTE — Progress Notes (Addendum)
Received message from Benjamin from [facility], [phone] stating they cannot accept referral. Message forwarded to [facility] CSW, Caryl Pina following patient.   Jonnie Finner RN CCM Case Mgmt phone [phone]

## 2018-12-13 NOTE — Progress Notes (Signed)
Initial Nutrition Assessment  INTERVENTION:   -Continue Ensure Enlive po BID, each supplement provides 350 kcal and 20 grams of protein -D/c Boost Breeze per pt preference  NUTRITION DIAGNOSIS:   Increased nutrient needs related to cancer and cancer related treatments as evidenced by estimated needs.  GOAL:   Patient will meet greater than or equal to 90% of their needs  MONITOR:   PO intake, Supplement acceptance, Labs, Weight trends, I & O's  REASON FOR ASSESSMENT:   Malnutrition Screening Tool, Consult Assessment of nutrition requirement/status  ASSESSMENT:   68 year old female with history of metastatic non-small cell lung cancer, metastatic to liver, brain on oral chemotherapy, hypertension, DVT since 2019, on Eliquis presented with near syncope, dizziness and lightheadedness when she tries to stand up.  The episodes are associated with worsening nausea and vomiting.  **RD working remotely**  Patient reports having N/V for 3 weeks PTA. Pt currently with no nausea and diet has been advanced from full liquids to soft. Pt tolerated full liquids well. Had grits, ice cream, juice, milk and jello this morning. Will monitor for tolerance of solid diet. Pt was ordered both Boost Breeze and Ensure supplements, pt prefers Ensure. Will d/c order for Boost Breeze.   UBW is 215 lbs (last weighed in September 2019). Pt has lost 35 lbs since then (16% wt loss x 10 months, insignificant for time frame).   Labs reviewed. Medications: K-DUR tablet daily  NUTRITION - FOCUSED PHYSICAL EXAM:  Unable to perform -working remotely.  Diet Order:   Diet Order            DIET SOFT Room service appropriate? Yes; Fluid consistency: Thin  Diet effective now              EDUCATION NEEDS:   No education needs have been identified at this time  Skin:  Skin Assessment: Reviewed RN Assessment  Last BM:  7/17  Height:   Ht Readings from Last 1 Encounters:  12/11/18 5\' 6"  (1.676 m)     Weight:   Wt Readings from Last 1 Encounters:  12/11/18 81.6 kg    Ideal Body Weight:  59.1 kg  BMI:  Body mass index is 29.05 kg/m.  Estimated Nutritional Needs:   Kcal:  1800-2000  Protein:  90-100g  Fluid:  2L/day   Clayton Bibles, MS, RD, LDN [place] Dietitian Pager: [phone] After Hours Pager: [phone]

## 2018-12-13 NOTE — Progress Notes (Addendum)
[facility]                                                                              Patient Demographics  Debra Hodges, is a 68 y.o. female, DOB - June 08, 1950, QMV:[id number]  Admit date - 12/11/2018   Admitting Physician Toy [place], MD  Outpatient Primary MD for the patient is No primary care provider on file.  Outpatient specialists:   LOS - 1  days   Medical records reviewed and are as summarized below:    Chief Complaint  Patient presents with   Near Syncope       Brief summary   Patient is a 68 year old female with history of metastatic non-small cell lung cancer, metastatic to liver, brain on oral chemotherapy, hypertension, DVT since 2019, on Eliquis presented with near syncope, dizziness and lightheadedness when she tries to stand up.  The episodes are associated with worsening nausea and vomiting.  No fevers or chills, chest pain or shortness of breath.  Patient states she gets very lightheaded when she tries to stand up from her chair and has to sit back down very quickly.  She has history of syncope in the past and was diagnosed with mets to the brain now finished radiation therapy. Patient reported 3 weeks of intermittent vomiting of brown fluid, denies any frank blood, no hematochezia or melena.  Reported hard time tolerating her medications secondary to persistent nausea and vomiting.  COVID-19 test negative   Assessment & Plan    Principal Problem: Near syncope: -Likely secondary to orthostasis, dehydration, intractable nausea and vomiting. -States nausea and vomiting better, no vomiting today. -Will advance diet to soft solids, tolerating full liquid diet, continue gentle hydration and antiemetics.  Active Problems:  Intractable nausea and vomiting -Nausea improving, advance diet to soft solids -Continue antiemetics as needed  Acute kidney injury -pre-renal secondary to nausea vomiting and dehydration -Baseline  creatinine 0.53 in 10/2018, presented with creatinine of 1.3 -Progressively improving, creatinine normalized to 0.5    Hypothyroidism -TSH very suppressed to less than 0.010, T4 elevated 2.36 -Decrease Synthroid dose to 25 MCG daily    Non-small cell lung cancer with metastasis to liver and brain ([place]), stage IV adenocarcinoma, diagnosed in 2015 -Hold repeating MRI of the brain, last on 11/18/2018 had shown regression of the metastatic disease to the brain following whole brain XRT -Oncology following, repeat staging CT chest abdomen and pelvis done, liver metastatic lesions, stable appearance of primarily therapy related findings in left perihilar region, air-fluid levels in the colon suggesting diarrheal process.  Stable right thyroid nodule.  Bilateral nonobstructive nephrolithiasis.   History of DVT -Continue Eliquis  Hypokalemia -Resolved  Failure to thrive, generalized debility -PT OT consult,nutrition consult   Code Status: Full CODE STATUS DVT Prophylaxis: Apixaban Family Communication: Discussed in detail with the patient, all imaging results, lab results explained to the patient.  Called patient's husband, did not pick up the phone, left voicemail with detailed update on daughter's phone.   Disposition Plan: Per patient nausea vomiting improving however still feels dizzy, has not ambulated, hence does not feel safe to go home.  Will start PT  evaluation today  Time Spent in minutes  28mins   Procedures:  None  Consultants:   Oncology  Antimicrobials:   Anti-infectives (From admission, onward)   None         Medications  Scheduled Meds:  apixaban  5 mg Oral BID   feeding supplement  1 Container Oral TID BM   feeding supplement (ENSURE ENLIVE)  237 mL Oral BID BM   gabapentin  300 mg Oral TID   levothyroxine  25 mcg Oral QHS   osimertinib mesylate  160 mg Oral Daily   potassium chloride SA  20 mEq Oral QHS   Continuous Infusions:  sodium  chloride 75 mL/hr at 12/13/18 0540   PRN Meds:.acetaminophen **OR** acetaminophen, iohexol, ondansetron (ZOFRAN) IV, ondansetron **OR** ondansetron (ZOFRAN) IV      Subjective:   Debra Hodges was seen and examined today.  Appears to be improving in better spirits.  Tolerating full liquid diet, no vomiting episodes today.  Wants to try soft solids.  No fevers.  However still feels somewhat dizzy and has not ambulated.    Objective:   Vitals:   12/12/18 2057 12/13/18 0019 12/13/18 0459 12/13/18 1132  BP: 120/72 117/67 128/61 116/86  Pulse: 99 93 89 100  Resp: 20 20 20 16   Temp: 98.3 F (36.8 C) 98 F (36.7 C) 98.6 F (37 C) 98.5 F (36.9 C)  TempSrc: Oral   Oral  SpO2: 99% 100% 98% 98%  Weight:      Height:        Intake/Output Summary (Last 24 hours) at 12/13/2018 1313 Last data filed at 12/13/2018 0931 Gross per 24 hour  Intake 2926.35 ml  Output --  Net 2926.35 ml     Wt Readings from Last 3 Encounters:  12/11/18 81.6 kg  11/18/18 75.7 kg  10/15/18 81.5 kg   Physical Exam  General: Alert and oriented x 3, NAD  Eyes:   HEENT:  Atraumatic, normocephalic  Cardiovascular: S1 S2 clear, no murmurs, RRR. No pedal edema b/l  Respiratory: CTAB, no wheezing, rales or rhonchi  Gastrointestinal: Soft, nontender, nondistended, NBS  Ext: no pedal edema bilaterally  Neuro: no new deficits  Musculoskeletal: No cyanosis, clubbing  Skin: No rashes  Psych: Normal affect and demeanor, alert and oriented x3    Data Reviewed:  I have personally reviewed following labs and imaging studies  Micro Results Recent Results (from the past 240 hour(s))  SARS Coronavirus 2 (CEPHEID - Performed in [place] hospital lab), Hosp Order     Status: None   Collection Time: 12/11/18  9:55 PM   Specimen: Nasopharyngeal Swab  Result Value Ref Range Status   SARS Coronavirus 2 NEGATIVE NEGATIVE Final    Comment: (NOTE) If result is NEGATIVE SARS-CoV-2 target nucleic acids  are NOT DETECTED. The SARS-CoV-2 RNA is generally detectable in upper and lower  respiratory specimens during the acute phase of infection. The lowest  concentration of SARS-CoV-2 viral copies this assay can detect is 250  copies / mL. A negative result does not preclude SARS-CoV-2 infection  and should not be used as the sole basis for treatment or other  patient management decisions.  A negative result may occur with  improper specimen collection / handling, submission of specimen other  than nasopharyngeal swab, presence of viral mutation(s) within the  areas targeted by this assay, and inadequate number of viral copies  (<250 copies / mL). A negative result must be combined with clinical  observations, patient history,  and epidemiological information. If result is POSITIVE SARS-CoV-2 target nucleic acids are DETECTED. The SARS-CoV-2 RNA is generally detectable in upper and lower  respiratory specimens dur ing the acute phase of infection.  Positive  results are indicative of active infection with SARS-CoV-2.  Clinical  correlation with patient history and other diagnostic information is  necessary to determine patient infection status.  Positive results do  not rule out bacterial infection or co-infection with other viruses. If result is PRESUMPTIVE POSTIVE SARS-CoV-2 nucleic acids MAY BE PRESENT.   A presumptive positive result was obtained on the submitted specimen  and confirmed on repeat testing.  While 2019 novel coronavirus  (SARS-CoV-2) nucleic acids may be present in the submitted sample  additional confirmatory testing may be necessary for epidemiological  and / or clinical management purposes  to differentiate between  SARS-CoV-2 and other Sarbecovirus currently known to infect humans.  If clinically indicated additional testing with an alternate test  methodology [phone]) is advised. The SARS-CoV-2 RNA is generally  detectable in upper and lower respiratory sp ecimens  during the acute  phase of infection. The expected result is Negative. Fact Sheet for Patients:  StrictlyIdeas.no Fact Sheet for Healthcare Providers: BankingDealers.co.za This test is not yet approved or cleared by the [place] FDA and has been authorized for detection and/or diagnosis of SARS-CoV-2 by FDA under an Emergency Use Authorization (EUA).  This EUA will remain in effect (meaning this test can be used) for the duration of the COVID-19 declaration under Section 564(b)(1) of the Act, 21 U.S.C. section 360bbb-3(b)(1), unless the authorization is terminated or revoked sooner. Performed at [facility], [place] [address] [place], [place] [address]     Radiology Reports Ct Chest W Contrast  Result Date: 12/12/2018 CLINICAL DATA:  Metastatic non-small cell lung cancer restaging. Syncope. Intermittent vomiting. EXAM: CT CHEST, ABDOMEN, AND PELVIS WITH CONTRAST TECHNIQUE: Multidetector CT imaging of the chest, abdomen and pelvis was performed following the standard protocol during bolus administration of intravenous contrast. CONTRAST:  122mL OMNIPAQUE IOHEXOL 300 MG/ML  SOLN COMPARISON:  Multiple exams, including 09/09/2018 FINDINGS: CT CHEST FINDINGS Cardiovascular: Atherosclerotic calcification of the aortic arch branch vessels. Mediastinum/Nodes: Calcified right anterior thyroid nodule, stable. Lungs/Pleura: Biapical pleuroparenchymal scarring. Similar morphology of left perihilar radiation therapy related findings including consolidation in the left upper lobe and left lower lobe perihilar regions as well as volume loss. Left suprahilar consolidation/nodularity is stable at 2.1 cm transverse on image 48/7. Stable 3 mm left upper lobe nodule on image 36/7. Musculoskeletal: Thoracic spondylosis. CT ABDOMEN PELVIS FINDINGS Hepatobiliary: Segment 2 hypodense lesion 1.4 by 1.1 cm, formerly 1.8 by 1.5 cm. Hypodense right  hepatic lobe lesion on image 40/2 measures 1.1 by 1.1 cm, formerly 1.4 by 1.3 cm. In segment 4 of the liver, a 2.0 by 1.7 cm lesion on image 40/2 was previously indistinct but less striking, probably measuring about 1.8 by 1.5 cm. Additional indistinct hypodense lesion in segment 3 is roughly stable. Gallbladder unremarkable.  No biliary dilatation. Pancreas: Unremarkable Spleen: Stable fluid density lesion of the splenic hilum on image 50/2, most likely benign. Adrenals/Urinary Tract: Both adrenal glands appear normal. 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 Small hypodense lesion of the right mid kidney 2 mm right mid kidney nonobstructive renal calculus. 5 mm left mid to lower kidney nonobstructive renal calculus. Stomach/Bowel: Air-fluid levels in the distal colon suggesting diarrheal process. Otherwise unremarkable. Vascular/Lymphatic: Aortoiliac atherosclerotic vascular disease. Reproductive: Uterus absent.  Adnexa  unremarkable. Other: 2 metal clips are noted along the porta hepatis, possibly fiducials, correlate with patient history. Musculoskeletal: Grade 1 degenerative anterolisthesis at L4-5 with associated degenerative facet arthropathy. IMPRESSION: 1. Mixed appearance of the liver metastatic lesions, with 2 of the hypodense lesions appearing smaller, but 1 of the hypodense lesions appearing slightly larger. 2. Stable appearance of primarily therapy related findings around the left perihilar region. Any underlying tumor hidden by the surrounding post therapy related findings has not progressed in the interim. 3. Air fluid levels in the colon suggests diarrheal process. 4. Other imaging findings of potential clinical significance: Aortic Atherosclerosis (ICD10-I70.0). Stable right thyroid nodule. 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 Bilateral nonobstructive nephrolithiasis. Electronically Signed   By: Van Clines M.D.   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 Status post whole brain radiation in May. EXAM: MRI HEAD WITHOUT AND WITH CONTRAST TECHNIQUE: Multiplanar, multiecho pulse sequences of the brain and surrounding structures were obtained without and with intravenous contrast. CONTRAST:  7 milliliters Gadavist COMPARISON:  Chat in hospital brain MRI 09/20/2018. FINDINGS: Brain: Stable cerebral volume. No restricted diffusion to suggest acute infarction. No midline shift, mass effect, ventriculomegaly, extra-axial collection or acute intracranial hemorrhage. Cervicomedullary junction and pituitary are within normal limits. Regression of scattered abnormal T2 and FLAIR hyperintensity which was associated with the predominantly left 0 meningeal appearing enhancing metastatic disease in April. Accordingly, virtually all of the areas of enhancing metastatic disease demonstrated on 09/21/2018 have regressed. There are 1 or 2 punctate areas of enhancement in the cerebellum which appear stable (such as in the left hemisphere on series 11, image 17). No new brain metastasis is identified. Vascular: Major intracranial vascular flow voids are stable. The major dural venous sinuses are enhancing and appear to be patent. Skull and upper cervical spine: Negative visible cervical spine and spinal cord. Stable bone marrow signal, within normal limits. Sinuses/Orbits: Negative orbits. Paranasal sinuses and mastoids are stable and well pneumatized. Other: Visible internal auditory structures appear normal. Scalp and face soft tissues appear negative. IMPRESSION: 1. Regression of metastatic disease to the brain following whole brain radiation. 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 2. No new intracranial abnormality. Electronically Signed   By: Genevie Ann  M.D.   On: 11/18/2018 13:21   Ct Abdomen Pelvis W Contrast  Result Date: 12/12/2018 : Please see accession [id number] for the complete report of CT chest, abdomen, and pelvis with contrast. Electronically Signed   By: Van Clines M.D.   On: 12/12/2018 15:27    Lab Data:  CBC: Recent Labs  Lab 12/11/18 1907 12/12/18 0353  WBC 5.5 4.4  HGB 11.6* 10.0*  HCT 35.8* 31.7*  MCV 92.5 93.8  PLT 98* 69*   Basic Metabolic Panel: Recent Labs  Lab 12/11/18 1907 12/11/18 2146 12/12/18 0353 12/13/18 0439  NA 139  --  136 140  K 2.8*  --  3.8 3.8  CL 95*  --  101 109  CO2 30  --  29 24  GLUCOSE 154*  --  102* 85  BUN 33*  --  27* 10  CREATININE 1.33*  --  0.77 0.52  CALCIUM 8.6*  --  7.6* 7.8*  MG  --  1.9 1.9  --   PHOS  --  3.7 2.6  --  GFR: Estimated Creatinine Clearance: 72.5 mL/min (by C-G formula based on SCr of 0.52 mg/dL). Liver Function Tests: Recent Labs  Lab 12/12/18 0353  AST 24  ALT 11  ALKPHOS 79  BILITOT 1.1  PROT 5.4*  ALBUMIN 2.5*   No results for input(s): LIPASE, AMYLASE in the last 168 hours. No results for input(s): AMMONIA in the last 168 hours. Coagulation Profile: No results for input(s): INR, PROTIME in the last 168 hours. Cardiac Enzymes: No results for input(s): CKTOTAL, CKMB, CKMBINDEX, TROPONINI in the last 168 hours. BNP (last 3 results) No results for input(s): PROBNP in the last 8760 hours. HbA1C: No results for input(s): HGBA1C in the last 72 hours. CBG: Recent Labs  Lab 12/11/18 1920  GLUCAP 150*   Lipid Profile: No results for input(s): CHOL, HDL, LDLCALC, TRIG, CHOLHDL, LDLDIRECT in the last 72 hours. Thyroid Function Tests: Recent Labs    12/12/18 0353  TSH <0.010*  FREET4 2.36*   Anemia Panel: No results for input(s): VITAMINB12, FOLATE, FERRITIN, TIBC, IRON, RETICCTPCT in the last 72 hours. Urine analysis:    Component Value Date/Time   COLORURINE AMBER (A) 12/12/2018 0414   APPEARANCEUR HAZY (A)  12/12/2018 0414   LABSPEC 1.017 12/12/2018 0414   PHURINE 6.0 12/12/2018 0414   GLUCOSEU NEGATIVE 12/12/2018 0414   HGBUR LARGE (A) 12/12/2018 0414   BILIRUBINUR NEGATIVE 12/12/2018 [place] 12/12/2018 0414   PROTEINUR 30 (A) 12/12/2018 0414   NITRITE NEGATIVE 12/12/2018 0414   LEUKOCYTESUR MODERATE (A) 12/12/2018 0414     Jackilyn Umphlett M.D. [facility] 12/13/2018, 1:13 PM  Pager: [phone] Between 7am to 7pm - call Pager - [phone]  After 7pm go to [email or web address] - password [email or web address]  Call night coverage person covering after 7pm

## 2018-12-13 NOTE — Evaluation (Signed)
Occupational Therapy Evaluation Patient Details Name: Debra Hodges MRN: [id number] DOB: 09-24-1950 Today's Date: 12/13/2018    History of Present Illness This 68 y.o. female admitted with near syncope, dizziness, and lightheadedness when she tries to stand up. Dx: likely due to orthostasis, dehydration, intractable nausea and vomiting; AKI.  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     Clinical Impression   Pt admitted with above. She demonstrates the below listed deficits and will benefit from continued OT to maximize safety and independence with BADLs.  Pt presents to OT with significant deconditioning.  She was only able to tolerate EOB sitting x 5 mins.   She c/o increased nausea upon sitting.  BP supine 119/70; sitting 116/69; HR 121.   She was unable to stand.  She did one scoot up the EOB with mod A.  She requires mod A for UB ADLs and max - total A for LB ADLs.  She reports she lives with her spouse and daughter and has 24 hour assist at home.  She currently does not feel family can meet her needs.  Discussed option of SNF level rehab with her - she wants to think about it.  IF she goes home recommend HHOT, hoyer lift, tilt in space w/c.        Follow Up Recommendations  SNF;Supervision/Assistance - 24 hour    Equipment Recommendations  Other (comment)(hoyer lift and tilt in space w/c )    Recommendations for Other Services       Precautions / Restrictions Precautions Precautions: Fall      Mobility Bed Mobility Overal bed mobility: Needs Assistance Bed Mobility: Supine to Sit;Sit to Supine     Supine to sit: Mod assist Sit to supine: Mod assist   General bed mobility comments: assist to lift and lower trunk and to lift LEs onto bed   Transfers                 General transfer comment: Pt unable to attempt     Balance Overall balance assessment: Needs assistance Sitting-balance support: Single extremity  supported Sitting balance-Leahy Scale: Poor Sitting balance - Comments: pt initially required mod A to maintain EOB sitting, but progressed to min guard assist.  Pt c/o increased nausea upon sitting                                    ADL either performed or assessed with clinical judgement   ADL Overall ADL's : Needs assistance/impaired Eating/Feeding: Set up;Sitting   Grooming: Wash/dry hands;Wash/dry face;Oral care;Brushing hair;Minimal assistance;Bed level   Upper Body Bathing: Moderate assistance;Bed level   Lower Body Bathing: Maximal assistance;Bed level   Upper Body Dressing : Maximal assistance;Bed level   Lower Body Dressing: Total assistance;Bed level Lower Body Dressing Details (indicate cue type and reason): unable to access her feet  Toilet Transfer: Total assistance Toilet Transfer Details (indicate cue type and reason): unable  Toileting- Clothing Manipulation and Hygiene: Total assistance;Bed level       Functional mobility during ADLs: Moderate assistance(bed mobility only )       Vision Baseline Vision/History: Wears glasses Wears Glasses: At all times Patient Visual Report: No change from baseline Additional Comments: ptosis noted Rt eye      Perception     Praxis      Pertinent Vitals/Pain Pain Assessment: Faces Faces Pain Scale: Hurts little more  Pain Location: skin when pressure applied to assist with transfers  Pain Descriptors / Indicators: Grimacing;Guarding Pain Intervention(s): Monitored during session;Repositioned;Limited activity within patient's tolerance     Hand Dominance Right   Extremity/Trunk Assessment Upper Extremity Assessment Upper Extremity Assessment: Generalized weakness;RUE deficits/detail;LUE deficits/detail RUE Deficits / Details: bil. UEs tremulous  RUE Coordination: decreased gross motor;decreased fine motor LUE Deficits / Details: bil. UEs tremulous  LUE Coordination: decreased fine motor;decreased  gross motor   Lower Extremity Assessment Lower Extremity Assessment: Defer to PT evaluation   Cervical / Trunk Assessment Cervical / Trunk Assessment: Other exceptions(trunk weakness noted )   Communication Communication Communication: No difficulties   Cognition Arousal/Alertness: Awake/alert Behavior During Therapy: WFL for tasks assessed/performed Overall Cognitive Status: No family/caregiver present to determine baseline cognitive functioning                                 General Comments: h/o whole brain radiation    General Comments  BP supine 119/70; sitting 116/69 HR 121     Exercises Exercises: Other exercises Other Exercises Other Exercises: Pt instructed to perform bil. UE shoulder flexion 2-4 reps hourly    Shoulder Instructions      Home Living Family/patient expects to be discharged to:: Private residence Living Arrangements: Spouse/significant other;Children Available Help at Discharge: Family Type of Home: House Home Access: Ramped entrance     Wise: One level     Bathroom Shower/Tub: Tub/shower unit         Home Equipment: Environmental consultant - 2 wheels;Wheelchair - manual(transport w/c; adjustable bed )   Additional Comments: Pt lives with her spouse and adult daughter, infant grand daughter       Prior Functioning/Environment Level of Independence: Needs assistance  Gait / Transfers Assistance Needed: Pt reports she does not walk.  She transfers from the bed to w/c 2x/day to go to bathroom  ADL's / Homemaking Assistance Needed: Pt requires assist for all aspects             OT Problem List: Decreased strength;Decreased activity tolerance;Impaired balance (sitting and/or standing);Decreased cognition;Decreased safety awareness;Impaired UE functional use;Pain      OT Treatment/Interventions: Self-care/ADL training;Therapeutic exercise;Neuromuscular education;DME and/or AE instruction;Therapeutic activities;Cognitive  remediation/compensation;Patient/family education;Balance training    OT Goals(Current goals can be found in the care plan section) Acute Rehab OT Goals Patient Stated Goal: to feel better  OT Goal Formulation: With patient Time For Goal Achievement: 12/27/18 Potential to Achieve Goals: Good ADL Goals Pt Will Perform Upper Body Bathing: (P) with set-up;with supervision;sitting Pt Will Perform Lower Body Bathing: (P) sit to/from stand;with adaptive equipment;with mod assist Pt Will Transfer to Toilet: (P) with mod assist;stand pivot transfer;bedside commode Pt Will Perform Toileting - Clothing Manipulation and hygiene: (P) with mod assist;sit to/from stand  OT Frequency: Min 2X/week   Barriers to D/C: Decreased caregiver support  Pt reports she doesn't feel her family can provide the level of assist she needs at discharge        Co-evaluation              AM-PAC OT "6 Clicks" Daily Activity     Outcome Measure Help from another person eating meals?: None Help from another person taking care of personal grooming?: A Little Help from another person toileting, which includes using toliet, bedpan, or urinal?: Total Help from another person bathing (including washing, rinsing, drying)?: A Lot Help from another person to put on  and taking off regular upper body clothing?: A Lot Help from another person to put on and taking off regular lower body clothing?: Total 6 Click Score: 13   End of Session Nurse Communication: Mobility status  Activity Tolerance: Patient limited by fatigue Patient left: in bed;with call bell/phone within reach  OT Visit Diagnosis: Unsteadiness on feet (R26.81);Muscle weakness (generalized) (M62.81)                Time: 8921-1941 OT Time Calculation (min): 26 min Charges:  OT General Charges $OT Visit: 1 Visit OT Evaluation $OT Eval Moderate Complexity: 1 Mod OT Treatments $Therapeutic Activity: 8-22 mins  Lucille Passy, OTR/L Acute Rehabilitation  Services Pager [phone] Office [phone]   Lucille Passy M 12/13/2018, 2:35 PM

## 2018-12-14 LAB — BASIC METABOLIC PANEL
Anion gap: 6 (ref 5–15)
BUN: 9 mg/dL (ref 8–23)
CO2: 26 mmol/L (ref 22–32)
Calcium: 7.9 mg/dL — ABNORMAL LOW (ref 8.9–10.3)
Chloride: 108 mmol/L (ref 98–111)
Creatinine, Ser: 0.51 mg/dL (ref 0.44–1.00)
GFR calc Af Amer: >60 mL/min (ref >60)
GFR calc non Af Amer: >60 mL/min (ref >60)
Glucose, Bld: 91 mg/dL (ref 70–99)
Potassium: 3.9 mmol/L (ref 3.5–5.1)
Sodium: 140 mmol/L (ref 135–145)

## 2018-12-14 LAB — CBC
HCT: 30.2 % — ABNORMAL LOW (ref 36.0–46.0)
Hemoglobin: 9.2 g/dL — ABNORMAL LOW (ref 12.0–15.0)
MCH: 29 pg (ref 26.0–34.0)
MCHC: 30.5 g/dL (ref 30.0–36.0)
MCV: 95.3 fL (ref 80.0–100.0)
Platelets: 64 10*3/uL — ABNORMAL LOW (ref 150–400)
RBC: 3.17 MIL/uL — ABNORMAL LOW (ref 3.87–5.11)
RDW: 15.9 % — ABNORMAL HIGH (ref 11.5–15.5)
WBC: 2.9 10*3/uL — ABNORMAL LOW (ref 4.0–10.5)
nRBC: 0 % (ref 0.0–0.2)

## 2018-12-14 LAB — URINE CULTURE

## 2018-12-14 MED ORDER — LIP MEDEX EX OINT
TOPICAL_OINTMENT | CUTANEOUS | Status: DC | PRN
  Filled 2018-12-14 (×2): qty 7

## 2018-12-14 NOTE — NC FL2 (Addendum)
[place] LEVEL OF CARE SCREENING TOOL     IDENTIFICATION  Patient Name: Debra Hodges Birthdate: [birth year] Sex: female Admission Date (Current Location): 12/11/2018  [facility] and [place] Number:  Herbalist and Address:  [facility],  [place] [address] [place]      Provider Number: [id number]  Attending Physician Name and Address:  Mendel Corning, MD  Relative Name and Phone Number:       Current Level of Care: Hospital Recommended Level of Care: [place] Prior Approval Number:    Date Approved/Denied:   PASRR Number: [id number]  Discharge Plan: SNF    Current Diagnoses: Patient Active Problem List   Diagnosis Date Noted  . Syncope 12/11/2018  . AKI (acute kidney injury) ([place]) 12/11/2018  . Dehydration 12/11/2018  . Hypokalemia 12/11/2018  . Intractable nausea and vomiting 11/18/2018  . Primary malignant neoplasm of lung metastatic to other site [facility])   . Intractable vomiting with nausea 11/17/2018  . 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 Neutropenia due to and not concurrent with chemotherapy ([place]) 04/20/2018  . Nausea and vomiting 04/18/2018  . Neutropenia ([place]) 04/18/2018  . Acute pulmonary embolism ([place]) 03/07/2018  . Hypothyroidism 03/07/2018  . Non-small cell lung cancer with metastasis ([place])   . Goals of care, counseling/discussion 02/11/2018  . Encounter for antineoplastic immunotherapy 02/11/2018  . Metastases to the liver ([place]) 01/24/2017  . Liver lesion 06/26/2016  . Non-small cell carcinoma of lung, stage 4 ([place])   . Vaginal lesion 01/06/2015  . Encounter for antineoplastic chemotherapy 01/06/2015  . Malignant neoplasm of upper lobe of left lung ([place]) 11/13/2013  . Lung mass 10/16/2013    Orientation RESPIRATION BLADDER Height & Weight     Self, Time, Situation, Place  Normal Continent Weight: 180 lb (81.6 kg) Height:  5\' 6"  (167.6 cm)  BEHAVIORAL SYMPTOMS/MOOD  NEUROLOGICAL BOWEL NUTRITION STATUS      Continent Diet(Low Sodium Heart Healthy)  AMBULATORY STATUS COMMUNICATION OF NEEDS Skin   Extensive Assist Verbally Normal                       Personal Care Assistance Level of Assistance  Bathing, Feeding, Dressing Bathing Assistance: Maximum assistance Feeding assistance: Independent Dressing Assistance: Maximum assistance     Functional Limitations Info  Sight, Hearing, Speech Sight Info: Impaired Hearing Info: Adequate Speech Info: Adequate    SPECIAL CARE FACTORS FREQUENCY  PT (By licensed PT), OT (By licensed OT)     PT Frequency: 5x/week OT Frequency: 5x/week            Contractures Contractures Info: Not present    Additional Factors Info  Code Status, Allergies, Psychotropic, Insulin Sliding Scale Code Status Info: Fullcode Allergies Info: Allergies: Peanut-containing Drug Products, Penicillins           Current Medications (12/14/2018):  This is the current hospital active medication list Current Facility-Administered Medications  Medication Dose Route Frequency Provider Last Rate Last Dose  . 0.9 %  sodium chloride infusion   Intravenous Continuous Rai, Ripudeep K, MD 75 mL/hr at 12/14/18 0921    . acetaminophen (TYLENOL) tablet 650 mg  650 mg Oral Q6H PRN Toy Baker, MD       Or  . acetaminophen (TYLENOL) suppository 650 mg  650 mg Rectal Q6H PRN Doutova, Anastassia, MD      . apixaban (ELIQUIS) tablet 5 mg  5 mg Oral BID Toy Baker, MD   5  mg at 12/14/18 1022  . feeding supplement (ENSURE ENLIVE) (ENSURE ENLIVE) liquid 237 mL  237 mL Oral BID BM Doutova, Anastassia, MD   237 mL at 12/13/18 1708  . gabapentin (NEURONTIN) capsule 300 mg  300 mg Oral TID Toy Baker, MD   300 mg at 12/14/18 1022  . iohexol (OMNIPAQUE) 300 MG/ML solution 15 mL  15 mL Oral Once PRN Rai, Ripudeep K, MD      . levothyroxine (SYNTHROID) tablet 25 mcg  25 mcg Oral QHS Rai, Ripudeep K, MD   25 mcg at  12/12/18 2227  . lip balm (CARMEX) ointment   Topical PRN Rai, Ripudeep K, MD      . ondansetron (ZOFRAN) injection 4 mg  4 mg Intravenous Q6H PRN Doutova, Anastassia, MD      . ondansetron (ZOFRAN) tablet 4 mg  4 mg Oral Q6H PRN Doutova, Anastassia, MD       Or  . ondansetron (ZOFRAN) injection 4 mg  4 mg Intravenous Q6H PRN Doutova, Anastassia, MD   4 mg at 12/12/18 2227  . osimertinib mesylate (TAGRISSO) tablet 160 mg  160 mg Oral Daily Doutova, Nyoka Lint, MD   160 mg at 12/13/18 2015  . potassium chloride SA (K-DUR) CR tablet 20 mEq  20 mEq Oral QHS Toy Baker, MD   20 mEq at 12/13/18 2212     Discharge Medications: Please see discharge summary for a list of discharge medications.  Relevant Imaging Results:  Relevant Lab Results:   Additional Information LMB:[id number]  Lia Hopping, LCSW

## 2018-12-14 NOTE — Progress Notes (Signed)
[facility]                                                                              Patient Demographics  Debra Hodges, is a 68 y.o. female, DOB - [birth year], JQB:[id number]  Admit date - 12/11/2018   Admitting Physician Toy Baker, MD  Outpatient Primary MD for the patient is No primary care provider on file.  Outpatient specialists:   LOS - 2  days   Medical records reviewed and are as summarized below:    Chief Complaint  Patient presents with   Near Syncope       Brief summary   Patient is a 68 year old female with history of metastatic non-small cell lung cancer, metastatic to liver, brain on oral chemotherapy, hypertension, DVT since 2019, on Eliquis presented with near syncope, dizziness and lightheadedness when she tries to stand up.  The episodes are associated with worsening nausea and vomiting.  No fevers or chills, chest pain or shortness of breath.  Patient states she gets very lightheaded when she tries to stand up from her chair and has to sit back down very quickly.  She has history of syncope in the past and was diagnosed with mets to the brain now finished radiation therapy. Patient reported 3 weeks of intermittent vomiting of brown fluid, denies any frank blood, no hematochezia or melena.  Reported hard time tolerating her medications secondary to persistent nausea and vomiting.  COVID-19 test negative   Assessment & Plan    Principal Problem: Near syncope: -Likely secondary to orthostasis, dehydration, intractable nausea and vomiting. -Overall improving, no dizziness, however does have significant generalized debility, deconditioning needing moderate assistance. -PT recommending skilled nursing facility -2D echo showed EF of 55%, normal right ventricular systolic function, left ventricular diastolic parameters consistent with impaired relaxation.  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  Stable right thyroid nodule.  Bilateral nonobstructive nephrolithiasis.  History of DVT -Continue Eliquis  Hypokalemia -Resolved  Failure to thrive, generalized debility -PT OT consult,nutrition consult  Left upper arm swelling Likely due to IV infiltration, recommended K pad and elevate arm   Code Status: Full CODE STATUS DVT Prophylaxis: Apixaban Family Communication: Discussed in detail with the patient, all imaging results, lab results explained to the patient.  Called patient's husband, did not pick up the phone, left voicemail with detailed update on  daughter's phone yesterday.   Disposition Plan: Skilled nursing facility, patient agreeable  Time Spent in minutes  18mins   Procedures:  None  Consultants:   Oncology  Antimicrobials:   Anti-infectives (From admission, onward)   None         Medications  Scheduled Meds:  apixaban  5 mg Oral BID   feeding supplement (ENSURE ENLIVE)  237 mL Oral BID BM   gabapentin  300 mg Oral TID   levothyroxine  25 mcg Oral QHS   osimertinib mesylate  160 mg Oral Daily    potassium chloride SA  20 mEq Oral QHS   Continuous Infusions:  sodium chloride 75 mL/hr at 12/14/18 0921   PRN Meds:.acetaminophen **OR** acetaminophen, iohexol, lip balm, ondansetron (ZOFRAN) IV, ondansetron **OR** ondansetron (ZOFRAN) IV      Subjective:   Debra Hodges was seen and examined today.  Overall improving but still feels very debilitated.  PT recommending skilled nursing facility, patient agreeable.  Tolerating diet.  Left upper arm appears to be somewhat swollen due to IV infiltration.  No fevers.  No chest pain or shortness of breath, abdominal pain.  Objective:   Vitals:   12/13/18 0459 12/13/18 1132 12/13/18 2336 12/14/18 0446  BP: 128/61 116/86 120/68 (!) 132/58  Pulse: 89 100 93 88  Resp: 20 16 18 20   Temp: 98.6 F (37 C) 98.5 F (36.9 C) 98.4 F (36.9 C) 99.2 F (37.3 C)  TempSrc:  Oral Oral Oral  SpO2: 98% 98% 100% 100%  Weight:      Height:        Intake/Output Summary (Last 24 hours) at 12/14/2018 1334 Last data filed at 12/14/2018 0600 Gross per 24 hour  Intake 1800 ml  Output 200 ml  Net 1600 ml     Wt Readings from Last 3 Encounters:  12/11/18 81.6 kg  11/18/18 75.7 kg  10/15/18 81.5 kg   Physical Exam  General: Alert and oriented x 3, NAD, frail and debilitated  Eyes:  HEENT:  Atraumatic, normocephalic  Cardiovascular: S1 S2 clear,  RRR. No pedal edema b/l  Respiratory: CTAB, no wheezing, rales or rhonchi  Gastrointestinal: Soft, nontender, nondistended, NBS  Ext: no pedal edema bilaterally, left upper arm swelling around IV line  Neuro: no new deficits  Musculoskeletal: No cyanosis, clubbing  Skin: No rashes  Psych: Normal affect and demeanor, alert and oriented x3    Data Reviewed:  I have personally reviewed following labs and imaging studies  Micro Results Recent Results (from the past 240 hour(s))  SARS Coronavirus 2 (CEPHEID - Performed in [place] hospital lab), Hosp Order     Status: None    Collection Time: 12/11/18  9:55 PM   Specimen: Nasopharyngeal Swab  Result Value Ref Range Status   SARS Coronavirus 2 NEGATIVE NEGATIVE Final    Comment: (NOTE) If result is NEGATIVE SARS-CoV-2 target nucleic acids are NOT DETECTED. The SARS-CoV-2 RNA is generally detectable in upper and lower  respiratory specimens during the acute phase of infection. The lowest  concentration of SARS-CoV-2 viral copies this assay can detect is 250  copies / mL. A negative result does not preclude SARS-CoV-2 infection  and should not be used as the sole basis for treatment or other  patient management decisions.  A negative result may occur with  improper specimen collection / handling, submission of specimen other  than nasopharyngeal swab, presence of viral mutation(s) within the  areas targeted by this assay, and inadequate number of  viral copies  (<250 copies / mL). A negative result must be combined with clinical  observations, patient history, and epidemiological information. If result is POSITIVE SARS-CoV-2 target nucleic acids are DETECTED. The SARS-CoV-2 RNA is generally detectable in upper and lower  respiratory specimens dur ing the acute phase of infection.  Positive  results are indicative of active infection with SARS-CoV-2.  Clinical  correlation with patient history and other diagnostic information is  necessary to determine patient infection status.  Positive results do  not rule out bacterial infection or co-infection with other viruses. If result is PRESUMPTIVE POSTIVE SARS-CoV-2 nucleic acids MAY BE PRESENT.   A presumptive positive result was obtained on the submitted specimen  and confirmed on repeat testing.  While 2019 novel coronavirus  (SARS-CoV-2) nucleic acids may be present in the submitted sample  additional confirmatory testing may be necessary for epidemiological  and / or clinical management purposes  to differentiate between  SARS-CoV-2 and other Sarbecovirus  currently known to infect humans.  If clinically indicated additional testing with an alternate test  methodology [phone]) is advised. The SARS-CoV-2 RNA is generally  detectable in upper and lower respiratory sp ecimens during the acute  phase of infection. The expected result is Negative. Fact Sheet for Patients:  StrictlyIdeas.no Fact Sheet for Healthcare Providers: BankingDealers.co.za This test is not yet approved or cleared by the [place] FDA and has been authorized for detection and/or diagnosis of SARS-CoV-2 by FDA under an Emergency Use Authorization (EUA).  This EUA will remain in effect (meaning this test can be used) for the duration of the COVID-19 declaration under Section 564(b)(1) of the Act, 21 U.S.C. section 360bbb-3(b)(1), unless the authorization is terminated or revoked sooner. Performed at [facility], [place] [address], [place], [place] [address]   Urine Culture     Status: None   Collection Time: 12/12/18  4:14 AM   Specimen: Urine, Random  Result Value Ref Range Status   Specimen Description   Final    URINE, RANDOM Performed at [place] [address] [place], [place] [address]    Special Requests   Final    NONE Performed at [facility], [place] [address] [place], [place] [address]    Culture   Final    Multiple bacterial morphotypes present, none predominant. Suggest appropriate recollection if clinically indicated.   Report Status 12/14/2018 FINAL  Final    Radiology Reports Ct Chest W Contrast  Result Date: 12/12/2018 CLINICAL DATA:  Metastatic non-small cell lung cancer restaging. Syncope. Intermittent vomiting. EXAM: CT CHEST, ABDOMEN, AND PELVIS WITH CONTRAST TECHNIQUE: Multidetector CT imaging of the chest, abdomen and pelvis was performed following the standard protocol during bolus administration of intravenous contrast. CONTRAST:   169mL OMNIPAQUE IOHEXOL 300 MG/ML  SOLN COMPARISON:  Multiple exams, including 09/09/2018 FINDINGS: CT CHEST FINDINGS Cardiovascular: Atherosclerotic calcification of the aortic arch branch vessels. Mediastinum/Nodes: Calcified right anterior thyroid nodule, stable. Lungs/Pleura: Biapical pleuroparenchymal scarring. Similar morphology of left perihilar radiation therapy related findings including consolidation in the left upper lobe and left lower lobe perihilar regions as well as volume loss. Left suprahilar consolidation/nodularity is stable at 2.1 cm transverse on image 48/7. Stable 3 mm left upper lobe nodule on image 36/7. Musculoskeletal: Thoracic spondylosis. CT ABDOMEN PELVIS FINDINGS Hepatobiliary: Segment 2 hypodense lesion 1.4 by 1.1 cm, formerly 1.8 by 1.5 cm. Hypodense right hepatic lobe lesion on image 40/2 measures 1.1 by 1.1 cm, formerly 1.4 by 1.3 cm.  In segment 4 of the liver, a 2.0 by 1.7 cm lesion on image 40/2 was previously indistinct but less striking, probably measuring about 1.8 by 1.5 cm. Additional indistinct hypodense lesion in segment 3 is roughly stable. Gallbladder unremarkable.  No biliary dilatation. Pancreas: Unremarkable Spleen: Stable fluid density lesion of the splenic hilum on image 50/2, most likely benign. Adrenals/Urinary Tract: Both adrenal glands appear normal. 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 Small hypodense lesion of the right mid kidney 2 mm right mid kidney nonobstructive renal calculus. 5 mm left mid to lower kidney nonobstructive renal calculus. Stomach/Bowel: Air-fluid levels in the distal colon suggesting diarrheal process. Otherwise unremarkable. Vascular/Lymphatic: Aortoiliac atherosclerotic vascular disease. Reproductive: Uterus absent.  Adnexa unremarkable. Other: 2 metal clips are noted along the porta hepatis, possibly fiducials, correlate with patient history. Musculoskeletal: Grade 1 degenerative anterolisthesis at L4-5 with  associated degenerative facet arthropathy. IMPRESSION: 1. Mixed appearance of the liver metastatic lesions, with 2 of the hypodense lesions appearing smaller, but 1 of the hypodense lesions appearing slightly larger. 2. Stable appearance of primarily therapy related findings around the left perihilar region. Any underlying tumor hidden by the surrounding post therapy related findings has not progressed in the interim. 3. Air fluid levels in the colon suggests diarrheal process. 4. Other imaging findings of potential clinical significance: Aortic Atherosclerosis (ICD10-I70.0). Stable right thyroid nodule. 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 Bilateral nonobstructive nephrolithiasis. Electronically Signed   By: Van Clines M.D.   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 Status post whole brain radiation in May. EXAM: MRI HEAD WITHOUT AND WITH CONTRAST TECHNIQUE: Multiplanar, multiecho pulse sequences of the brain and surrounding structures were obtained without and with intravenous contrast. CONTRAST:  7 milliliters Gadavist COMPARISON:  Chat in hospital brain MRI 09/20/2018. FINDINGS: Brain: Stable cerebral volume. No restricted diffusion to suggest acute infarction. No midline shift, mass effect, ventriculomegaly, extra-axial collection or acute intracranial hemorrhage. Cervicomedullary junction and pituitary are within normal limits. Regression of scattered abnormal T2 and FLAIR hyperintensity which was associated with the predominantly left 0 meningeal appearing enhancing metastatic disease in April. Accordingly, virtually all of the areas of enhancing metastatic disease demonstrated on 09/21/2018 have regressed. There are 1 or 2 punctate areas of enhancement in the cerebellum which appear stable (such as in the left hemisphere on  series 11, image 17). No new brain metastasis is identified. Vascular: Major intracranial vascular flow voids are stable. The major dural venous sinuses are enhancing and appear to be patent. Skull and upper cervical spine: Negative visible cervical spine and spinal cord. Stable bone marrow signal, within normal limits. Sinuses/Orbits: Negative orbits. Paranasal sinuses and mastoids are stable and well pneumatized. Other: Visible internal auditory structures appear normal. Scalp and face soft tissues appear negative. IMPRESSION: 1. Regression of metastatic disease to the brain following whole brain radiation. 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 2. No new intracranial abnormality. Electronically Signed   By: Genevie Ann M.D.   On: 11/18/2018 13:21   Ct Abdomen Pelvis W Contrast  Result Date: 12/12/2018 : Please see accession [id number] for the complete report of CT chest, abdomen, and pelvis with contrast. Electronically Signed   By: Van Clines M.D.   On: 12/12/2018 15:27    Lab Data:  CBC:  Recent Labs  Lab 12/11/18 1907 12/12/18 0353 12/14/18 0548  WBC 5.5 4.4 2.9*  HGB 11.6* 10.0* 9.2*  HCT 35.8* 31.7* 30.2*  MCV 92.5 93.8 95.3  PLT 98* 69* 64*   Basic Metabolic Panel: Recent Labs  Lab 12/11/18 1907 12/11/18 2146 12/12/18 0353 12/13/18 0439 12/14/18 0548  NA 139  --  136 140 140  K 2.8*  --  3.8 3.8 3.9  CL 95*  --  101 109 108  CO2 30  --  29 24 26   GLUCOSE 154*  --  102* 85 91  BUN 33*  --  27* 10 9  CREATININE 1.33*  --  0.77 0.52 0.51  CALCIUM 8.6*  --  7.6* 7.8* 7.9*  MG  --  1.9 1.9  --   --   PHOS  --  3.7 2.6  --   --    GFR: Estimated Creatinine Clearance: 72.5 mL/min (by C-G formula based on SCr of 0.51 mg/dL). Liver Function Tests: Recent Labs  Lab 12/12/18 0353  AST 24  ALT 11  ALKPHOS 79  BILITOT 1.1  PROT 5.4*  ALBUMIN 2.5*   No results for input(s): LIPASE, AMYLASE in the last  168 hours. No results for input(s): AMMONIA in the last 168 hours. Coagulation Profile: No results for input(s): INR, PROTIME in the last 168 hours. Cardiac Enzymes: No results for input(s): CKTOTAL, CKMB, CKMBINDEX, TROPONINI in the last 168 hours. BNP (last 3 results) No results for input(s): PROBNP in the last 8760 hours. HbA1C: No results for input(s): HGBA1C in the last 72 hours. CBG: Recent Labs  Lab 12/11/18 1920 12/13/18 1816  GLUCAP 150* 98   Lipid Profile: No results for input(s): CHOL, HDL, LDLCALC, TRIG, CHOLHDL, LDLDIRECT in the last 72 hours. Thyroid Function Tests: Recent Labs    12/12/18 0353  TSH <0.010*  FREET4 2.36*   Anemia Panel: No results for input(s): VITAMINB12, FOLATE, FERRITIN, TIBC, IRON, RETICCTPCT in the last 72 hours. Urine analysis:    Component Value Date/Time   COLORURINE AMBER (A) 12/12/2018 0414   APPEARANCEUR HAZY (A) 12/12/2018 0414   LABSPEC 1.017 12/12/2018 0414   PHURINE 6.0 12/12/2018 0414   GLUCOSEU NEGATIVE 12/12/2018 0414   HGBUR LARGE (A) 12/12/2018 0414   BILIRUBINUR NEGATIVE 12/12/2018 [place] 12/12/2018 0414   PROTEINUR 30 (A) 12/12/2018 0414   NITRITE NEGATIVE 12/12/2018 0414   LEUKOCYTESUR MODERATE (A) 12/12/2018 0414     Debra Hodges M.D. [facility] 12/14/2018, 1:34 PM  Pager: [phone] Between 7am to 7pm - call Pager - [phone]  After 7pm go to [email or web address] - password [email or web address]  Call night coverage person covering after 7pm

## 2018-12-14 NOTE — Progress Notes (Signed)
PT Cancellation Note  Patient Details Name: Debra Hodges MRN: [id number] DOB: 1951-05-22   Cancelled Treatment:      spoke wth patient for a while at bedside. Pt stated her husband helps her with all mobility and was placing her in a w/c to go tot he bathroom, but she would pass out even in the w/c so she has a fear of even trying to do that. Her goal would be to be able to help go tot eh bathroom again, but she doesn't know if that will ever be safe again. Pt is realistic and has a fear that her husband will npt be able to do it all, but she knows that he would like for her to go home and be with him.   She is up for trying to sit EOB with use tomorrow and maybe learn strategies to make things safe. Could she have more help from hospice or [facility] services? And or more equipment to help them manage at home?    Clide Dales 12/14/2018, 5:19 PM  Clide Dales, PT Acute Rehabilitation Services Pager: [phone] Office: [phone] 12/14/2018

## 2018-12-14 NOTE — TOC Initial Note (Addendum)
Transition of Care [facility]) - Initial/Assessment Note    Patient Details  Name: Debra Hodges MRN: [id number] Date of Birth: 1950-11-07  Transition of Care [facility]) CM/SW Contact:    Lia Hopping, [place] Phone Number: 12/14/2018, 3:02 PM  Clinical Narrative:                 CSW reached out to the patient to plan disposition. Patient report her spouse and daughter assist with her ADL's. Patient report she is not involved with any home assistance programs. Patient express her spouse and daughter cannot meet her needs at this time therefore is agreeable to SNF. Patient reports she can usually transfer into a wheelchair but unable to do so at this time due to feeling weak.  Patient reports her spouse went to [place] [facility] a few years ago and prefers to go there if it is an option. CSW explain SNF process and will follow up with SNF options later. CSW explain SNF no visitor policy due to pandemic. Patient reports understanding.   FL2 completed.  Patient need an updated COVID-19 test prior to discharging to SNF.    Expected Discharge Plan: [place]     Patient Goals and CMS Choice   CMS Medicare.gov Compare Post Acute Care list provided to:: Patient Choice offered to / list presented to : Patient  Expected Discharge Plan and Services Expected Discharge Plan: [place] In-house Referral: Clinical Social Work     Living arrangements for the past 2 months: [place]                                      Prior Living Arrangements/Services Living arrangements for the past 2 months: Single Family Home Lives with:: Spouse Patient language and need for interpreter reviewed:: No Do you feel safe going back to the place where you live?: Yes      Need for Family Participation in Patient Care: Yes (Comment) Care giver support system in place?: Yes (comment)   Criminal Activity/Legal Involvement Pertinent to Current  Situation/Hospitalization: No - Comment as needed  Activities of Daily Living Home Assistive Devices/Equipment: Environmental consultant (specify type), Wheelchair ADL Screening (condition at time of admission) Patient's cognitive ability adequate to safely complete daily activities?: Yes Is the patient deaf or have difficulty hearing?: No Does the patient have difficulty seeing, even when wearing glasses/contacts?: No Does the patient have difficulty concentrating, remembering, or making decisions?: No Patient able to express need for assistance with ADLs?: Yes Does the patient have difficulty dressing or bathing?: No Independently performs ADLs?: No Communication: Needs assistance Is this a change from baseline?: Pre-admission baseline Dressing (OT): Needs assistance Is this a change from baseline?: Pre-admission baseline Grooming: Needs assistance Is this a change from baseline?: Pre-admission baseline Feeding: Independent Bathing: Needs assistance Is this a change from baseline?: Pre-admission baseline Toileting: Needs assistance Is this a change from baseline?: Pre-admission baseline In/Out Bed: Needs assistance Is this a change from baseline?: Pre-admission baseline Walks in Home: Needs assistance Does the patient have difficulty walking or climbing stairs?: Yes Weakness of Legs: Both Weakness of Arms/Hands: Both  Permission Sought/Granted Permission sought to share information with : Family Supports, Customer service manager, Case Optician, dispensing granted to share information with : Yes, Verbal Permission Granted  Share Information with NAME: Neely,[name]  Permission granted to share info w AGENCY: area SNF  Permission granted to share info  w Relationship: Spouse  Permission granted to share info w Contact Information: [phone]  [phone]  Emotional Assessment Appearance:: Appears stated age Attitude/Demeanor/Rapport: Engaged Affect (typically observed): Accepting,  Pleasant Orientation: : Oriented to Self, Oriented to Place, Oriented to  Time, Oriented to Situation Alcohol / Substance Use: Not Applicable Psych Involvement: No (comment)  Admission diagnosis:  Dehydration [E86.0] Patient Active Problem List   Diagnosis Date Noted  . Syncope 12/11/2018  . AKI (acute kidney injury) ([place]) 12/11/2018  . Dehydration 12/11/2018  . Hypokalemia 12/11/2018  . Intractable nausea and vomiting 11/18/2018  . Primary malignant neoplasm of lung metastatic to other site [facility])   . Intractable vomiting with nausea 11/17/2018  . 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 Neutropenia due to and not concurrent with chemotherapy ([place]) 04/20/2018  . Nausea and vomiting 04/18/2018  . Neutropenia ([place]) 04/18/2018  . Acute pulmonary embolism ([place]) 03/07/2018  . Hypothyroidism 03/07/2018  . Non-small cell lung cancer with metastasis ([place])   . Goals of care, counseling/discussion 02/11/2018  . Encounter for antineoplastic immunotherapy 02/11/2018  . Metastases to the liver ([place]) 01/24/2017  . Liver lesion 06/26/2016  . Non-small cell carcinoma of lung, stage 4 ([place])   . Vaginal lesion 01/06/2015  . Encounter for antineoplastic chemotherapy 01/06/2015  . Malignant neoplasm of upper lobe of left lung ([place]) 11/13/2013  . Lung mass 10/16/2013   PCP:  No primary care provider on file. Pharmacy:   [facility] [place], [place] [address] [place] [address] Phone: [phone] Fax: [place], [place] [address] [place] [address] Phone: [phone] Fax: [phone]     Social Determinants of Health (SDOH) Interventions    Readmission Risk Interventions No flowsheet data found.

## 2018-12-15 LAB — BASIC METABOLIC PANEL
Anion gap: 9 (ref 5–15)
BUN: 6 mg/dL — ABNORMAL LOW (ref 8–23)
CO2: 25 mmol/L (ref 22–32)
Calcium: 8.1 mg/dL — ABNORMAL LOW (ref 8.9–10.3)
Chloride: 107 mmol/L (ref 98–111)
Creatinine, Ser: 0.51 mg/dL (ref 0.44–1.00)
GFR calc Af Amer: >60 mL/min (ref >60)
GFR calc non Af Amer: >60 mL/min (ref >60)
Glucose, Bld: 81 mg/dL (ref 70–99)
Potassium: 3.6 mmol/L (ref 3.5–5.1)
Sodium: 141 mmol/L (ref 135–145)

## 2018-12-15 LAB — VITAMIN B12: Vitamin B-12: 897 pg/mL (ref 180–914)

## 2018-12-15 LAB — CORTISOL: Cortisol, Plasma: 7.3 ug/dL

## 2018-12-15 LAB — FOLATE: Folate: 8.3 ng/mL (ref >5.9)

## 2018-12-15 LAB — CBC
HCT: 30.7 % — ABNORMAL LOW (ref 36.0–46.0)
Hemoglobin: 9.5 g/dL — ABNORMAL LOW (ref 12.0–15.0)
MCH: 29.1 pg (ref 26.0–34.0)
MCHC: 30.9 g/dL (ref 30.0–36.0)
MCV: 93.9 fL (ref 80.0–100.0)
Platelets: 64 10*3/uL — ABNORMAL LOW (ref 150–400)
RBC: 3.27 MIL/uL — ABNORMAL LOW (ref 3.87–5.11)
RDW: 15.9 % — ABNORMAL HIGH (ref 11.5–15.5)
WBC: 2.6 10*3/uL — ABNORMAL LOW (ref 4.0–10.5)
nRBC: 0 % (ref 0.0–0.2)

## 2018-12-15 MED ORDER — MECLIZINE HCL 25 MG PO TABS
12.5000 mg | ORAL_TABLET | Freq: Three times a day (TID) | ORAL | Status: DC
  Administered 2018-12-15 – 2018-12-17 (×7): 12.5 mg via ORAL
  Filled 2018-12-15 (×7): qty 1

## 2018-12-15 MED ORDER — PRO-STAT SUGAR FREE PO LIQD
30.0000 mL | Freq: Two times a day (BID) | ORAL | Status: DC
  Administered 2018-12-15: 30 mL via ORAL
  Filled 2018-12-15 (×3): qty 30

## 2018-12-15 NOTE — Evaluation (Signed)
Physical Therapy Evaluation Patient Details Name: Debra Hodges MRN: [id number] DOB: 1950/09/25 Today's Date: 12/15/2018   History of Present Illness  68 y.o. female admitted with near syncope, dizziness, and lightheadedness when she tries to stand up. Dx: likely due to orthostasis, dehydration, intractable nausea and vomiting; AKI.  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  Clinical Impression  On eval, pt required Mod assist for bed mobility and Max assist to partially stand at EOB. Pt c/o lightheadedness and nausea during session. Pt presents with general weakness, decreased activity tolerance, impaired balance, and inability to ambulate. At this time, recommendation is for SNF. Will continue to follow.     Follow Up Recommendations SNF    Equipment Recommendations  None recommended by PT    Recommendations for Other Services OT consult     Precautions / Restrictions Precautions Precautions: Fall Restrictions Weight Bearing Restrictions: No      Mobility  Bed Mobility Overal bed mobility: Needs Assistance Bed Mobility: Rolling;Sidelying to Sit;Sit to Supine Rolling: Min assist Sidelying to sit: Min assist;HOB elevated   Sit to supine: Mod assist   General bed mobility comments: Assist for trunk and LEs onto bed. Increased time. Pt relied on bedrail. Cues for safety, technique. Pt c/o lightheadedness and nausea  Transfers Overall transfer level: Needs assistance   Transfers: Sit to/from Stand Sit to Stand: Max assist         General transfer comment: Attempted x 1. Pt was able to clear bottom from surface but unable to stand fully upright (achieved ~50% of full standing posture). Vcs safety, technique. Pt c/o lightheadedness and nausea  Ambulation/Gait                Stairs            Wheelchair Mobility    Modified Rankin (Stroke Patients Only)       Balance Overall balance assessment: History of Falls   Sitting balance-Leahy Scale: Good       Standing balance-Leahy Scale: Zero                               Pertinent Vitals/Pain Pain Assessment: No/denies pain    Home Living Family/patient expects to be discharged to:: Private residence Living Arrangements: Spouse/significant other;Children Available Help at Discharge: Family Type of Home: House Home Access: Ramped entrance     Home Layout: One level Home Equipment: Environmental consultant - 2 wheels;Wheelchair - manual Additional Comments: Pt lives with her spouse and adult daughter, infant grand daughter     Prior Function Level of Independence: Needs assistance   Gait / Transfers Assistance Needed: Pt reports she does not walk.  She transfers from the bed to w/c 2x/day to go to bathroom   ADL's / Homemaking Assistance Needed: Pt requires assist for all aspects         Hand Dominance        Extremity/Trunk Assessment   Upper Extremity Assessment Upper Extremity Assessment: Defer to OT evaluation    Lower Extremity Assessment Lower Extremity Assessment: Generalized weakness       Communication   Communication: No difficulties  Cognition   Behavior During Therapy: WFL for tasks assessed/performed Overall Cognitive Status: No family/caregiver present to determine baseline cognitive functioning  General Comments      Exercises     Assessment/Plan    PT Assessment Patient needs continued PT services  PT Problem List Decreased strength;Decreased mobility;Decreased balance;Decreased activity tolerance;Decreased knowledge of use of DME       PT Treatment Interventions DME instruction;Gait training;Therapeutic exercise;Therapeutic activities;Patient/family education;Balance training;Functional mobility training    PT Goals (Current goals can be found in the Care Plan section)  Acute Rehab PT Goals Patient Stated Goal: to feel better  PT Goal  Formulation: With patient Time For Goal Achievement: 12/29/18 Potential to Achieve Goals: Fair    Frequency Min 3X/week   Barriers to discharge        Co-evaluation               AM-PAC PT "6 Clicks" Mobility  Outcome Measure Help needed turning from your back to your side while in a flat bed without using bedrails?: A Little Help needed moving from lying on your back to sitting on the side of a flat bed without using bedrails?: A Little Help needed moving to and from a bed to a chair (including a wheelchair)?: Total Help needed standing up from a chair using your arms (e.g., wheelchair or bedside chair)?: Total Help needed to walk in hospital room?: Total Help needed climbing 3-5 steps with a railing? : Total 6 Click Score: 10    End of Session Equipment Utilized During Treatment: Gait belt Activity Tolerance: Patient limited by fatigue Patient left: in bed;with call bell/phone within reach;with bed alarm set   PT Visit Diagnosis: History of falling (Z91.81);Muscle weakness (generalized) (M62.81)    Time: 2122-4825 PT Time Calculation (min) (ACUTE ONLY): 18 min   Charges:   PT Evaluation $PT Eval Moderate Complexity: [place], PT Acute Rehabilitation Services Pager: [phone] Office: [phone]

## 2018-12-15 NOTE — Progress Notes (Signed)
Dr. Tana Coast made aware that patient unable to stand for orthostatic VS.

## 2018-12-15 NOTE — Progress Notes (Signed)
[facility]                                                                              Patient Demographics  Debra Hodges, is a 68 y.o. female, DOB - [birth year], EGB:[id number]  Admit date - 12/11/2018   Admitting Physician Toy Baker, MD  Outpatient Primary MD for the patient is No primary care provider on file.  Outpatient specialists:   LOS - 3  days   Medical records reviewed and are as summarized below:    Chief Complaint  Patient presents with   Near Syncope       Brief summary   Patient is a 68 year old female with history of metastatic non-small cell lung cancer, metastatic to liver, brain on oral chemotherapy, hypertension, DVT since 2019, on Eliquis presented with near syncope, dizziness and lightheadedness when she tries to stand up.  The episodes are associated with worsening nausea and vomiting.  No fevers or chills, chest pain or shortness of breath.  Patient states she gets very lightheaded when she tries to stand up from her chair and has to sit back down very quickly.  She has history of syncope in the past and was diagnosed with mets to the brain now finished radiation therapy. Patient reported 3 weeks of intermittent vomiting of brown fluid, denies any frank blood, no hematochezia or melena.  Reported hard time tolerating her medications secondary to persistent nausea and vomiting.  COVID-19 test negative   Assessment & Plan    Principal Problem: Near syncope: -Likely secondary to orthostasis, dehydration, intractable nausea and vomiting. - significant generalized debility, deconditioning needing moderate assistance, continues to complain of dizziness on sitting up, follow cortisol level, B12, folate normal -No chest pain or shortness of breath, unable to do orthostatics due to persistent dizziness, will repeat MRI of the brain to rule out CVA/intracranial bleed.  Patient has known history of mets to brain. -2D echo showed EF  of 55%, normal right ventricular systolic function, left ventricular diastolic parameters consistent with impaired relaxation. -Will place TED hose, meclizine 3 times daily   Active Problems:  Intractable nausea and vomiting -Still has nausea, states overnight one episode of vomiting, continue soft solids -Continue antiemetics as needed  Acute kidney injury -pre-renal secondary to nausea vomiting and dehydration -Baseline creatinine 0.53 in 10/2018, presented with creatinine of 1.3 -Creatinine at baseline    Hypothyroidism -TSH very suppressed to less than 0.010, T4 elevated 2.36 -Decreased Synthroid dose to 25 MCG daily    Non-small cell lung cancer with metastasis to liver and brain ([place]), stage IV adenocarcinoma, diagnosed in 2015 -Hold repeating MRI of the brain, last on 11/18/2018 had shown regression of the metastatic disease to the brain following whole brain XRT -Oncology following, repeat staging CT chest abdomen and pelvis done, liver metastatic lesions, stable appearance of primarily therapy related findings in left perihilar region, air-fluid levels in the colon suggesting diarrheal process.  Stable right thyroid nodule.  Bilateral nonobstructive nephrolithiasis.  History of DVT -Continue Eliquis  Hypokalemia -Resolved  Failure to thrive, generalized debility -PT recommended skilled nursing facility versus home health.  Patient feels her husband will  be unable to take care of her with the level of assistance she needs currently  Left upper arm swelling Improving  Moderate protein calorie malnutrition Continue nutritional supplements, pro-stat  Code Status: Full CODE STATUS DVT Prophylaxis: Apixaban Family Communication: Discussed in detail with the patient, all imaging results, lab results explained to the patient and husband on phone.     Disposition Plan: Patient thinks her her husband will not be able to provide level of care she needs.  She is okay with going  to short-term rehab and then return home.  Time Spent in minutes  25 mins   Procedures:  None  Consultants:   Oncology  Antimicrobials:   Anti-infectives (From admission, onward)   None         Medications  Scheduled Meds:  apixaban  5 mg Oral BID   feeding supplement (ENSURE ENLIVE)  237 mL Oral BID BM   gabapentin  300 mg Oral TID   levothyroxine  25 mcg Oral QHS   meclizine  12.5 mg Oral TID   osimertinib mesylate  160 mg Oral Daily   potassium chloride SA  20 mEq Oral QHS   Continuous Infusions:  PRN Meds:.acetaminophen **OR** acetaminophen, iohexol, lip balm, ondansetron (ZOFRAN) IV, ondansetron **OR** ondansetron (ZOFRAN) IV      Subjective:   Debra Hodges was seen and examined today.  Still feels a dizzy on standing up, unable to do orthostatics.  Tolerating diet, still feels nauseous, no fevers.  Left upper arm looks better.   No chest pain or shortness of breath, abdominal pain.  Objective:   Vitals:   12/15/18 0607 12/15/18 0938 12/15/18 0941 12/15/18 1011  BP: 140/61 133/66 137/71   Pulse: 83 99 (!) 118 91  Resp: 20     Temp: 99.1 F (37.3 C)     TempSrc: Oral     SpO2: 99% 96% 97%   Weight:      Height:        Intake/Output Summary (Last 24 hours) at 12/15/2018 1322 Last data filed at 12/15/2018 1242 Gross per 24 hour  Intake 2023.72 ml  Output 1950 ml  Net 73.72 ml     Wt Readings from Last 3 Encounters:  12/11/18 81.6 kg  11/18/18 75.7 kg  10/15/18 81.5 kg   Physical Exam  General: Alert and oriented x 3, frail, somewhat nauseous at the time of my exam  Eyes:   HEENT:  Atraumatic, normocephalic  Cardiovascular: S1 S2 clear, RRR. No pedal edema b/l  Respiratory: CTAB, no wheezing, rales or rhonchi  Gastrointestinal: Soft, nontender, nondistended, NBS  Ext: no pedal edema bilaterally  Neuro: no new deficits  Musculoskeletal: No cyanosis, clubbing  Skin: No rashes  Psych: Normal affect and demeanor, alert  and oriented x3    Data Reviewed:  I have personally reviewed following labs and imaging studies  Micro Results Recent Results (from the past 240 hour(s))  SARS Coronavirus 2 (CEPHEID - Performed in [place] hospital lab), Hosp Order     Status: None   Collection Time: 12/11/18  9:55 PM   Specimen: Nasopharyngeal Swab  Result Value Ref Range Status   SARS Coronavirus 2 NEGATIVE NEGATIVE Final    Comment: (NOTE) If result is NEGATIVE SARS-CoV-2 target nucleic acids are NOT DETECTED. The SARS-CoV-2 RNA is generally detectable in upper and lower  respiratory specimens during the acute phase of infection. The lowest  concentration of SARS-CoV-2 viral copies this assay can detect is 250  copies /  mL. A negative result does not preclude SARS-CoV-2 infection  and should not be used as the sole basis for treatment or other  patient management decisions.  A negative result may occur with  improper specimen collection / handling, submission of specimen other  than nasopharyngeal swab, presence of viral mutation(s) within the  areas targeted by this assay, and inadequate number of viral copies  (<250 copies / mL). A negative result must be combined with clinical  observations, patient history, and epidemiological information. If result is POSITIVE SARS-CoV-2 target nucleic acids are DETECTED. The SARS-CoV-2 RNA is generally detectable in upper and lower  respiratory specimens dur ing the acute phase of infection.  Positive  results are indicative of active infection with SARS-CoV-2.  Clinical  correlation with patient history and other diagnostic information is  necessary to determine patient infection status.  Positive results do  not rule out bacterial infection or co-infection with other viruses. If result is PRESUMPTIVE POSTIVE SARS-CoV-2 nucleic acids MAY BE PRESENT.   A presumptive positive result was obtained on the submitted specimen  and confirmed on repeat testing.  While 2019  novel coronavirus  (SARS-CoV-2) nucleic acids may be present in the submitted sample  additional confirmatory testing may be necessary for epidemiological  and / or clinical management purposes  to differentiate between  SARS-CoV-2 and other Sarbecovirus currently known to infect humans.  If clinically indicated additional testing with an alternate test  methodology [phone]) is advised. The SARS-CoV-2 RNA is generally  detectable in upper and lower respiratory sp ecimens during the acute  phase of infection. The expected result is Negative. Fact Sheet for Patients:  StrictlyIdeas.no Fact Sheet for Healthcare Providers: BankingDealers.co.za This test is not yet approved or cleared by the [place] FDA and has been authorized for detection and/or diagnosis of SARS-CoV-2 by FDA under an Emergency Use Authorization (EUA).  This EUA will remain in effect (meaning this test can be used) for the duration of the COVID-19 declaration under Section 564(b)(1) of the Act, 21 U.S.C. section 360bbb-3(b)(1), unless the authorization is terminated or revoked sooner. Performed at [facility], [place] [address] [place], [place] [address]   Urine Culture     Status: None   Collection Time: 12/12/18  4:14 AM   Specimen: Urine, Random  Result Value Ref Range Status   Specimen Description   Final    URINE, RANDOM Performed at [facility] [address] [place], [place] [address]    Special Requests   Final    NONE Performed at [facility], [place] [address], [place], [place] [address]    Culture   Final    Multiple bacterial morphotypes present, none predominant. Suggest appropriate recollection if clinically indicated.   Report Status 12/14/2018 FINAL  Final    Radiology Reports Ct Chest W Contrast  Result Date: 12/12/2018 CLINICAL DATA:  Metastatic non-small cell lung cancer restaging.  Syncope. Intermittent vomiting. EXAM: CT CHEST, ABDOMEN, AND PELVIS WITH CONTRAST TECHNIQUE: Multidetector CT imaging of the chest, abdomen and pelvis was performed following the standard protocol during bolus administration of intravenous contrast. CONTRAST:  156mL OMNIPAQUE IOHEXOL 300 MG/ML  SOLN COMPARISON:  Multiple exams, including 09/09/2018 FINDINGS: CT CHEST FINDINGS Cardiovascular: Atherosclerotic calcification of the aortic arch branch vessels. Mediastinum/Nodes: Calcified right anterior thyroid nodule, stable. Lungs/Pleura: Biapical pleuroparenchymal scarring. Similar morphology of left perihilar radiation therapy related findings including consolidation in the left upper lobe and left lower lobe perihilar regions as well  as volume loss. Left suprahilar consolidation/nodularity is stable at 2.1 cm transverse on image 48/7. Stable 3 mm left upper lobe nodule on image 36/7. Musculoskeletal: Thoracic spondylosis. CT ABDOMEN PELVIS FINDINGS Hepatobiliary: Segment 2 hypodense lesion 1.4 by 1.1 cm, formerly 1.8 by 1.5 cm. Hypodense right hepatic lobe lesion on image 40/2 measures 1.1 by 1.1 cm, formerly 1.4 by 1.3 cm. In segment 4 of the liver, a 2.0 by 1.7 cm lesion on image 40/2 was previously indistinct but less striking, probably measuring about 1.8 by 1.5 cm. Additional indistinct hypodense lesion in segment 3 is roughly stable. Gallbladder unremarkable.  No biliary dilatation. Pancreas: Unremarkable Spleen: Stable fluid density lesion of the splenic hilum on image 50/2, most likely benign. Adrenals/Urinary Tract: Both adrenal glands appear normal. 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 Small hypodense lesion of the right mid kidney 2 mm right mid kidney nonobstructive renal calculus. 5 mm left mid to lower kidney nonobstructive renal calculus. Stomach/Bowel: Air-fluid levels in the distal colon suggesting diarrheal process. Otherwise unremarkable. Vascular/Lymphatic: Aortoiliac  atherosclerotic vascular disease. Reproductive: Uterus absent.  Adnexa unremarkable. Other: 2 metal clips are noted along the porta hepatis, possibly fiducials, correlate with patient history. Musculoskeletal: Grade 1 degenerative anterolisthesis at L4-5 with associated degenerative facet arthropathy. IMPRESSION: 1. Mixed appearance of the liver metastatic lesions, with 2 of the hypodense lesions appearing smaller, but 1 of the hypodense lesions appearing slightly larger. 2. Stable appearance of primarily therapy related findings around the left perihilar region. Any underlying tumor hidden by the surrounding post therapy related findings has not progressed in the interim. 3. Air fluid levels in the colon suggests diarrheal process. 4. Other imaging findings of potential clinical significance: Aortic Atherosclerosis (ICD10-I70.0). Stable right thyroid nodule. 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 Bilateral nonobstructive nephrolithiasis. Electronically Signed   By: Van Clines M.D.   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 Status post whole brain radiation in May. EXAM: MRI HEAD WITHOUT AND WITH CONTRAST TECHNIQUE: Multiplanar, multiecho pulse sequences of the brain and surrounding structures were obtained without and with intravenous contrast. CONTRAST:  7 milliliters Gadavist COMPARISON:  Chat in hospital brain MRI 09/20/2018. FINDINGS: Brain: Stable cerebral volume. No restricted diffusion to suggest acute infarction. No midline shift, mass effect, ventriculomegaly, extra-axial collection or acute intracranial hemorrhage. Cervicomedullary junction and pituitary are within normal limits. Regression of scattered abnormal T2 and FLAIR hyperintensity which was associated with the predominantly left 0 meningeal appearing enhancing  metastatic disease in April. Accordingly, virtually all of the areas of enhancing metastatic disease demonstrated on 09/21/2018 have regressed. There are 1 or 2 punctate areas of enhancement in the cerebellum which appear stable (such as in the left hemisphere on series 11, image 17). No new brain metastasis is identified. Vascular: Major intracranial vascular flow voids are stable. The major dural venous sinuses are enhancing and appear to be patent. Skull and upper cervical spine: Negative visible cervical spine and spinal cord. Stable bone marrow signal, within normal limits. Sinuses/Orbits: Negative orbits. Paranasal sinuses and mastoids are stable and well pneumatized. Other: Visible internal auditory structures appear normal. Scalp and face soft tissues appear negative. IMPRESSION: 1. Regression of metastatic disease to the brain following whole brain radiation. 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 2. No new intracranial  abnormality. Electronically Signed   By: Genevie Ann M.D.   On: 11/18/2018 13:21   Ct Abdomen Pelvis W Contrast  Result Date: 12/12/2018 : Please see accession [id number] for the complete report of CT chest, abdomen, and pelvis with contrast. Electronically Signed   By: Van Clines M.D.   On: 12/12/2018 15:27    Lab Data:  CBC: Recent Labs  Lab 12/11/18 1907 12/12/18 0353 12/14/18 0548 12/15/18 0518  WBC 5.5 4.4 2.9* 2.6*  HGB 11.6* 10.0* 9.2* 9.5*  HCT 35.8* 31.7* 30.2* 30.7*  MCV 92.5 93.8 95.3 93.9  PLT 98* 69* 64* 64*   Basic Metabolic Panel: Recent Labs  Lab 12/11/18 1907 12/11/18 2146 12/12/18 0353 12/13/18 0439 12/14/18 0548 12/15/18 0518  NA 139  --  136 140 140 141  K 2.8*  --  3.8 3.8 3.9 3.6  CL 95*  --  101 109 108 107  CO2 30  --  29 24 26 25   GLUCOSE 154*  --  102* 85 91 81  BUN 33*  --  27* 10 9 6*  CREATININE 1.33*  --  0.77 0.52 0.51 0.51  CALCIUM 8.6*  --  7.6* 7.8* 7.9*  8.1*  MG  --  1.9 1.9  --   --   --   PHOS  --  3.7 2.6  --   --   --    GFR: Estimated Creatinine Clearance: 72.5 mL/min (by C-G formula based on SCr of 0.51 mg/dL). Liver Function Tests: Recent Labs  Lab 12/12/18 0353  AST 24  ALT 11  ALKPHOS 79  BILITOT 1.1  PROT 5.4*  ALBUMIN 2.5*   No results for input(s): LIPASE, AMYLASE in the last 168 hours. No results for input(s): AMMONIA in the last 168 hours. Coagulation Profile: No results for input(s): INR, PROTIME in the last 168 hours. Cardiac Enzymes: No results for input(s): CKTOTAL, CKMB, CKMBINDEX, TROPONINI in the last 168 hours. BNP (last 3 results) No results for input(s): PROBNP in the last 8760 hours. HbA1C: No results for input(s): HGBA1C in the last 72 hours. CBG: Recent Labs  Lab 12/11/18 1920 12/13/18 1816  GLUCAP 150* 98   Lipid Profile: No results for input(s): CHOL, HDL, LDLCALC, TRIG, CHOLHDL, LDLDIRECT in the last 72 hours. Thyroid Function Tests: No results for input(s): TSH, T4TOTAL, FREET4, T3FREE, THYROIDAB in the last 72 hours. Anemia Panel: Recent Labs    12/15/18 0518  VITAMINB12 897  FOLATE 8.3   Urine analysis:    Component Value Date/Time   COLORURINE AMBER (A) 12/12/2018 0414   APPEARANCEUR HAZY (A) 12/12/2018 0414   LABSPEC 1.017 12/12/2018 0414   PHURINE 6.0 12/12/2018 0414   GLUCOSEU NEGATIVE 12/12/2018 0414   HGBUR LARGE (A) 12/12/2018 0414   BILIRUBINUR NEGATIVE 12/12/2018 [place] 12/12/2018 0414   PROTEINUR 30 (A) 12/12/2018 0414   NITRITE NEGATIVE 12/12/2018 0414   LEUKOCYTESUR MODERATE (A) 12/12/2018 0414     Shawanna Zanders M.D. [facility] 12/15/2018, 1:22 PM  Pager: [phone] Between 7am to 7pm - call Pager - [phone]  After 7pm go to [email or web address] - password [email or web address]  Call night coverage person covering after 7pm

## 2018-12-16 LAB — BASIC METABOLIC PANEL
Anion gap: 6 (ref 5–15)
BUN: 7 mg/dL — ABNORMAL LOW (ref 8–23)
CO2: 27 mmol/L (ref 22–32)
Calcium: 8 mg/dL — ABNORMAL LOW (ref 8.9–10.3)
Chloride: 108 mmol/L (ref 98–111)
Creatinine, Ser: 0.5 mg/dL (ref 0.44–1.00)
GFR calc Af Amer: >60 mL/min (ref >60)
GFR calc non Af Amer: >60 mL/min (ref >60)
Glucose, Bld: 87 mg/dL (ref 70–99)
Potassium: 3.6 mmol/L (ref 3.5–5.1)
Sodium: 141 mmol/L (ref 135–145)

## 2018-12-16 LAB — CBC
HCT: 30.8 % — ABNORMAL LOW (ref 36.0–46.0)
Hemoglobin: 9.4 g/dL — ABNORMAL LOW (ref 12.0–15.0)
MCH: 28.8 pg (ref 26.0–34.0)
MCHC: 30.5 g/dL (ref 30.0–36.0)
MCV: 94.5 fL (ref 80.0–100.0)
Platelets: 65 10*3/uL — ABNORMAL LOW (ref 150–400)
RBC: 3.26 MIL/uL — ABNORMAL LOW (ref 3.87–5.11)
RDW: 16.1 % — ABNORMAL HIGH (ref 11.5–15.5)
WBC: 3 10*3/uL — ABNORMAL LOW (ref 4.0–10.5)
nRBC: 0 % (ref 0.0–0.2)

## 2018-12-16 MED ORDER — ENSURE ENLIVE PO LIQD: 237.0000 mL | Freq: Two times a day (BID) | ORAL | 12 refills | Status: AC

## 2018-12-16 MED ORDER — LEVOTHYROXINE SODIUM 25 MCG PO TABS: 25.0000 ug | ORAL_TABLET | Freq: Every day | ORAL | 0 refills | Status: AC

## 2018-12-16 MED ORDER — PRO-STAT SUGAR FREE PO LIQD: 30.0000 mL | Freq: Two times a day (BID) | ORAL | 0 refills | Status: DC

## 2018-12-16 NOTE — Care Management Important Message (Signed)
Important Message  Patient Details IM Letter given to Rhea Pink SW to present to the Patient Name: Debra Hodges MRN: [id number] Date of Birth: 01-20-1951   Medicare Important Message Given:  Yes     Kerin Salen 12/16/2018, 10:46 AM

## 2018-12-16 NOTE — Discharge Summary (Signed)
Physician Discharge Summary  Debra Hodges WNI:[id number] DOB: 23-Jul-1950 DOA: 12/11/2018  PCP: No primary care provider on file.  Admit date: 12/11/2018 Discharge date: 12/17/2018  Admitted From: Home.  Disposition:  Home.   Recommendations for Outpatient Follow-up:  1. Follow up with PCP in 1-2 weeks 2. Please obtain BMP/CBC in one week Please follow up with Dr Julien Nordmann as scheduled.   Discharge Condition:stable.  CODE STATUS:full code.  Diet recommendation: Heart Healthy  Brief/Interim Summary: Patient is a 68 year old female with history of metastatic non-small cell lung cancer, metastatic to liver, brain on oral chemotherapy, hypertension, DVT since 2019, on Eliquis presented with near syncope, dizziness and lightheadedness when she tries to stand up.  The episodes are associated with worsening nausea and vomiting.  No fevers or chills, chest pain or shortness of breath.  Patient states she gets very lightheaded when she tries to stand up from her chair and has to sit back down very quickly.  She has history of syncope in the past and was diagnosed with mets to the brain now finished radiation therapy  Discharge Diagnoses:  Principal Problem:   Syncope Active Problems:   Hypothyroidism   Non-small cell lung cancer with metastasis (HCC)   Intractable nausea and vomiting   AKI (acute kidney injury) ([place])   Dehydration   Hypokalemia  Near syncope: -Likely secondary to orthostasis, dehydration, intractable nausea and vomiting. - significant generalized debility, deconditioning needing moderate assistance, will need SNF for continued physical therapy.  -No chest pain or shortness of breath,. - oncology suggests to defer another MRI as it was done less than a month ago.    Patient has known history of mets to brain. -2D echo showed EF of 55%, normal right ventricular systolic function, left ventricular diastolic parameters consistent with impaired relaxation. -Will place TED  hose, meclizine 3 times daily    Intractable nausea and vomiting -Still has nausea, states overnight one episode of vomiting, continue soft solids -Continue antiemetics as needed  Acute kidney injury -pre-renal secondary to nausea vomiting and dehydration -Baseline creatinine 0.53 in 10/2018, presented with creatinine of 1.3 -Creatinine at baseline    Hypothyroidism -TSH very suppressed to less than 0.010, T4 elevated 2.36 -Decreased Synthroid dose to 25 MCG daily    Non-small cell lung cancer with metastasis to liver and brain ([facility]), stage IV adenocarcinoma, diagnosed in 2015 -Hold repeating MRI of the brain, last on 11/18/2018 had shown regression of the metastatic disease to the brain following whole brain XRT -Oncology following, repeat staging CT chest abdomen and pelvis done, liver metastatic lesions, stable appearance of primarily therapy related findings in left perihilar region, air-fluid levels in the colon suggesting diarrheal process.  Stable right thyroid nodule.  Bilateral nonobstructive nephrolithiasis.  History of DVT -Continue Eliquis  Hypokalemia -Resolved  Failure to thrive, generalized debility -PT recommended skilled nursing facility versus home health.  Patient feels her husband will be unable to take care of her with the level of assistance she needs currently  Left upper arm swelling Improving  Moderate protein calorie malnutrition Continue nutritional supplements, pro-stat   Discharge Instructions   Allergies as of 12/16/2018      Reactions   Peanut-containing Drug Products    Excessive mucous   Penicillins    Hives, Childhood Allergy Has patient had a PCN reaction causing immediate rash, facial/tongue/throat swelling, SOB or lightheadedness with hypotension: Yes Has patient had a PCN reaction causing severe rash involving mucus membranes or skin necrosis: No Has patient had a  PCN reaction that required hospitalization: No Has patient  had a PCN reaction occurring within the last 10 years: No If all of the above answers are "NO", then may proceed with Cephalosporin use.      Medication List    STOP taking these medications   Climara 0.1 mg/24hr patch Generic drug: estradiol   Clinpro 5000 1.1 % Pste Generic drug: Sodium Fluoride   oxyCODONE-acetaminophen 5-325 MG tablet Commonly known as: PERCOCET/ROXICET   prochlorperazine 10 MG tablet Commonly known as: COMPAZINE   spironolactone 25 MG tablet Commonly known as: ALDACTONE     TAKE these medications   acetaminophen 325 MG tablet Commonly known as: TYLENOL Take 650 mg by mouth every 6 (six) hours as needed for mild pain. Reported on 06/15/2015   calcium carbonate 600 MG Tabs tablet Commonly known as: OS-CAL Take 600 mg by mouth daily.   Eliquis 5 MG Tabs tablet Generic drug: apixaban TAKE 1 TABLET(5 MG) BY MOUTH TWICE DAILY What changed: See the new instructions.   feeding supplement (ENSURE ENLIVE) Liqd Take 237 mLs by mouth 2 (two) times daily between meals.   feeding supplement (PRO-STAT SUGAR FREE 64) Liqd Take 30 mLs by mouth 2 (two) times daily.   gabapentin 100 MG capsule Commonly known as: NEURONTIN TAKE 3 CAPSULES(300 MG) BY MOUTH THREE TIMES DAILY What changed: See the new instructions.   levothyroxine 25 MCG tablet Commonly known as: SYNTHROID Take 1 tablet (25 mcg total) by mouth at bedtime. What changed:   medication strength  how much to take   loperamide 2 MG capsule Commonly known as: IMODIUM Take 2 mg by mouth daily as needed for diarrhea or loose stools. Reported on 11/22/2015   ONE-A-DAY WOMENS PO Take 1 tablet by mouth daily.   osimertinib mesylate 80 MG tablet Commonly known as: Tagrisso Take 2 tablets (160 mg total) by mouth daily.   potassium chloride SA 20 MEQ tablet Commonly known as: K-DUR Take 1 tablet (20 mEq total) by mouth at bedtime.   vitamin C 500 MG tablet Commonly known as: ASCORBIC ACID Take  500 mg by mouth daily.      Follow-up Information    Curt Bears, MD Follow up.   Specialty: Oncology Why: as scheduled  Contact information: [address] [place] [address] [phone]          Allergies  Allergen Reactions  . Peanut-Containing Drug Products     Excessive mucous  . Penicillins     Hives, Childhood Allergy Has patient had a PCN reaction causing immediate rash, facial/tongue/throat swelling, SOB or lightheadedness with hypotension: Yes Has patient had a PCN reaction causing severe rash involving mucus membranes or skin necrosis: No Has patient had a PCN reaction that required hospitalization: No Has patient had a PCN reaction occurring within the last 10 years: No If all of the above answers are "NO", then may proceed with Cephalosporin use.     Consultations:  Oncology.    Procedures/Studies: Ct Chest W Contrast  Result Date: 12/12/2018 CLINICAL DATA:  Metastatic non-small cell lung cancer restaging. Syncope. Intermittent vomiting. EXAM: CT CHEST, ABDOMEN, AND PELVIS WITH CONTRAST TECHNIQUE: Multidetector CT imaging of the chest, abdomen and pelvis was performed following the standard protocol during bolus administration of intravenous contrast. CONTRAST:  158mL OMNIPAQUE IOHEXOL 300 MG/ML  SOLN COMPARISON:  Multiple exams, including 09/09/2018 FINDINGS: CT CHEST FINDINGS Cardiovascular: Atherosclerotic calcification of the aortic arch branch vessels. Mediastinum/Nodes: Calcified right anterior thyroid nodule, stable. Lungs/Pleura: Biapical pleuroparenchymal  scarring. Similar morphology of left perihilar radiation therapy related findings including consolidation in the left upper lobe and left lower lobe perihilar regions as well as volume loss. Left suprahilar consolidation/nodularity is stable at 2.1 cm transverse on image 48/7. Stable 3 mm left upper lobe nodule on image 36/7. Musculoskeletal: Thoracic spondylosis. CT ABDOMEN PELVIS  FINDINGS Hepatobiliary: Segment 2 hypodense lesion 1.4 by 1.1 cm, formerly 1.8 by 1.5 cm. Hypodense right hepatic lobe lesion on image 40/2 measures 1.1 by 1.1 cm, formerly 1.4 by 1.3 cm. In segment 4 of the liver, a 2.0 by 1.7 cm lesion on image 40/2 was previously indistinct but less striking, probably measuring about 1.8 by 1.5 cm. Additional indistinct hypodense lesion in segment 3 is roughly stable. Gallbladder unremarkable.  No biliary dilatation. Pancreas: Unremarkable Spleen: Stable fluid density lesion of the splenic hilum on image 50/2, most likely benign. Adrenals/Urinary Tract: Both adrenal glands appear normal. 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 Small hypodense lesion of the right mid kidney 2 mm right mid kidney nonobstructive renal calculus. 5 mm left mid to lower kidney nonobstructive renal calculus. Stomach/Bowel: Air-fluid levels in the distal colon suggesting diarrheal process. Otherwise unremarkable. Vascular/Lymphatic: Aortoiliac atherosclerotic vascular disease. Reproductive: Uterus absent.  Adnexa unremarkable. Other: 2 metal clips are noted along the porta hepatis, possibly fiducials, correlate with patient history. Musculoskeletal: Grade 1 degenerative anterolisthesis at L4-5 with associated degenerative facet arthropathy. IMPRESSION: 1. Mixed appearance of the liver metastatic lesions, with 2 of the hypodense lesions appearing smaller, but 1 of the hypodense lesions appearing slightly larger. 2. Stable appearance of primarily therapy related findings around the left perihilar region. Any underlying tumor hidden by the surrounding post therapy related findings has not progressed in the interim. 3. Air fluid levels in the colon suggests diarrheal process. 4. Other imaging findings of potential clinical significance: Aortic Atherosclerosis (ICD10-I70.0). Stable right thyroid nodule. 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 Bilateral nonobstructive nephrolithiasis.  Electronically Signed   By: Van Clines M.D.   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 Status post whole brain radiation in May. EXAM: MRI HEAD WITHOUT AND WITH CONTRAST TECHNIQUE: Multiplanar, multiecho pulse sequences of the brain and surrounding structures were obtained without and with intravenous contrast. CONTRAST:  7 milliliters Gadavist COMPARISON:  Chat in hospital brain MRI 09/20/2018. FINDINGS: Brain: Stable cerebral volume. No restricted diffusion to suggest acute infarction. No midline shift, mass effect, ventriculomegaly, extra-axial collection or acute intracranial hemorrhage. Cervicomedullary junction and pituitary are within normal limits. Regression of scattered abnormal T2 and FLAIR hyperintensity which was associated with the predominantly left 0 meningeal appearing enhancing metastatic disease in April. Accordingly, virtually all of the areas of enhancing metastatic disease demonstrated on 09/21/2018 have regressed. There are 1 or 2 punctate areas of enhancement in the cerebellum which appear stable (such as in the left hemisphere on series 11, image 17). No new brain metastasis is identified. Vascular: Major intracranial vascular flow voids are stable. The major dural venous sinuses are enhancing and appear to be patent. Skull and upper cervical spine: Negative visible cervical spine and spinal cord. Stable bone marrow signal, within normal limits. Sinuses/Orbits: Negative orbits. Paranasal sinuses and mastoids are stable and well pneumatized. Other: Visible internal auditory structures appear normal. Scalp and face soft tissues appear negative. IMPRESSION: 1. Regression of metastatic  disease to the brain following whole brain radiation. Residual leptomeningeal and possible occasional parenchymal enhancing mets are  annotated on series 11. 2. No new intracranial abnormality. Electronically Signed   By: Genevie Ann M.D.   On: 11/18/2018 13:21   Ct Abdomen Pelvis W Contrast  Result Date: 12/12/2018 : Please see accession [id number] for the complete report of CT chest, abdomen, and pelvis with contrast. Electronically Signed   By: Van Clines M.D.   On: 12/12/2018 15:27       Subjective: No nausea, vomiting or dizziness.   Discharge Exam: Vitals:   12/15/18 2110 12/16/18 0605  BP: 113/68 130/63  Pulse: 96 87  Resp: 20 20  Temp: 98.5 F (36.9 C) 98.5 F (36.9 C)  SpO2: 97% 97%   Vitals:   12/15/18 1011 12/15/18 1328 12/15/18 2110 12/16/18 0605  BP:  127/66 113/68 130/63  Pulse: 91 95 96 87  Resp:  16 20 20   Temp:  98.5 F (36.9 C) 98.5 F (36.9 C) 98.5 F (36.9 C)  TempSrc:  Oral Oral Oral  SpO2:  97% 97% 97%  Weight:      Height:        General: Pt is alert, awake, not in acute distress Cardiovascular: RRR, S1/S2 +, no rubs, no gallops Respiratory: CTA bilaterally, no wheezing, no rhonchi Abdominal: Soft, NT, ND, bowel sounds + Extremities: no edema, no cyanosis    The results of significant diagnostics from this hospitalization (including imaging, microbiology, ancillary and laboratory) are listed below for reference.     Microbiology: Recent Results (from the past 240 hour(s))  SARS Coronavirus 2 (CEPHEID - Performed in [place] hospital lab), Hosp Order     Status: None   Collection Time: 12/11/18  9:55 PM   Specimen: Nasopharyngeal Swab  Result Value Ref Range Status   SARS Coronavirus 2 NEGATIVE NEGATIVE Final    Comment: (NOTE) If result is NEGATIVE SARS-CoV-2 target nucleic acids are NOT DETECTED. The SARS-CoV-2 RNA is generally detectable in upper and lower  respiratory specimens during the acute phase of infection. The lowest  concentration of SARS-CoV-2 viral copies this assay can detect is 250  copies / mL. A negative result does not preclude  SARS-CoV-2 infection  and should not be used as the sole basis for treatment or other  patient management decisions.  A negative result may occur with  improper specimen collection / handling, submission of specimen other  than nasopharyngeal swab, presence of viral mutation(s) within the  areas targeted by this assay, and inadequate number of viral copies  (<250 copies / mL). A negative result must be combined with clinical  observations, patient history, and epidemiological information. If result is POSITIVE SARS-CoV-2 target nucleic acids are DETECTED. The SARS-CoV-2 RNA is generally detectable in upper and lower  respiratory specimens dur ing the acute phase of infection.  Positive  results are indicative of active infection with SARS-CoV-2.  Clinical  correlation with patient history and other diagnostic information is  necessary to determine patient infection status.  Positive results do  not rule out bacterial infection or co-infection with other viruses. If result is PRESUMPTIVE POSTIVE SARS-CoV-2 nucleic acids MAY BE PRESENT.   A presumptive positive result was obtained on the submitted specimen  and confirmed on repeat testing.  While 2019 novel coronavirus  (SARS-CoV-2) nucleic acids may be present in the submitted sample  additional confirmatory testing may be necessary for epidemiological  and / or clinical management purposes  to differentiate  between  SARS-CoV-2 and other Sarbecovirus currently known to infect humans.  If clinically indicated additional testing with an alternate test  methodology [phone]) is advised. The SARS-CoV-2 RNA is generally  detectable in upper and lower respiratory sp ecimens during the acute  phase of infection. The expected result is Negative. Fact Sheet for Patients:  StrictlyIdeas.no Fact Sheet for Healthcare Providers: BankingDealers.co.za This test is not yet approved or cleared by the  [place] FDA and has been authorized for detection and/or diagnosis of SARS-CoV-2 by FDA under an Emergency Use Authorization (EUA).  This EUA will remain in effect (meaning this test can be used) for the duration of the COVID-19 declaration under Section 564(b)(1) of the Act, 21 U.S.C. section 360bbb-3(b)(1), unless the authorization is terminated or revoked sooner. Performed at [facility], [place] [address], [place], [place] [address]   Urine Culture     Status: None   Collection Time: 12/12/18  4:14 AM   Specimen: Urine, Random  Result Value Ref Range Status   Specimen Description   Final    URINE, RANDOM Performed at [facility] [address] [place], [place] [address]    Special Requests   Final    NONE Performed at [facility], [place] [address], [place],  [address]    Culture   Final    Multiple bacterial morphotypes present, none predominant. Suggest appropriate recollection if clinically indicated.   Report Status 12/14/2018 FINAL  Final     Labs: BNP (last 3 results) Recent Labs    03/06/18 2209  BNP 69.6   Basic Metabolic Panel: Recent Labs  Lab 12/11/18 2146 12/12/18 0353 12/13/18 0439 12/14/18 0548 12/15/18 0518 12/16/18 0501  NA  --  136 140 140 141 141  K  --  3.8 3.8 3.9 3.6 3.6  CL  --  101 109 108 107 108  CO2  --  29 24 26 25 27   GLUCOSE  --  102* 85 91 81 87  BUN  --  27* 10 9 6* 7*  CREATININE  --  0.77 0.52 0.51 0.51 0.50  CALCIUM  --  7.6* 7.8* 7.9* 8.1* 8.0*  MG 1.9 1.9  --   --   --   --   PHOS 3.7 2.6  --   --   --   --    Liver Function Tests: Recent Labs  Lab 12/12/18 0353  AST 24  ALT 11  ALKPHOS 79  BILITOT 1.1  PROT 5.4*  ALBUMIN 2.5*   No results for input(s): LIPASE, AMYLASE in the last 168 hours. No results for input(s): AMMONIA in the last 168 hours. CBC: Recent Labs  Lab 12/11/18 1907 12/12/18 0353 12/14/18 0548 12/15/18 0518 12/16/18 0501   WBC 5.5 4.4 2.9* 2.6* 3.0*  HGB 11.6* 10.0* 9.2* 9.5* 9.4*  HCT 35.8* 31.7* 30.2* 30.7* 30.8*  MCV 92.5 93.8 95.3 93.9 94.5  PLT 98* 69* 64* 64* 65*   Cardiac Enzymes: No results for input(s): CKTOTAL, CKMB, CKMBINDEX, TROPONINI in the last 168 hours. BNP: Invalid input(s): POCBNP CBG: Recent Labs  Lab 12/11/18 1920 12/13/18 1816  GLUCAP 150* 98   D-Dimer No results for input(s): DDIMER in the last 72 hours. Hgb A1c No results for input(s): HGBA1C in the last 72 hours. Lipid Profile No results for input(s): CHOL, HDL, LDLCALC, TRIG, CHOLHDL, LDLDIRECT in the last 72 hours. Thyroid function studies No results for input(s): TSH, T4TOTAL, T3FREE, THYROIDAB in the  last 72 hours.  Invalid input(s): FREET3 Anemia work up Recent Labs    12/15/18 0518  VITAMINB12 897  FOLATE 8.3   Urinalysis    Component Value Date/Time   COLORURINE AMBER (A) 12/12/2018 0414   APPEARANCEUR HAZY (A) 12/12/2018 0414   LABSPEC 1.017 12/12/2018 0414   PHURINE 6.0 12/12/2018 0414   GLUCOSEU NEGATIVE 12/12/2018 0414   HGBUR LARGE (A) 12/12/2018 0414   BILIRUBINUR NEGATIVE 12/12/2018 0414   KETONESUR NEGATIVE 12/12/2018 0414   PROTEINUR 30 (A) 12/12/2018 0414   NITRITE NEGATIVE 12/12/2018 0414   LEUKOCYTESUR MODERATE (A) 12/12/2018 0414   Sepsis Labs Invalid input(s): PROCALCITONIN,  WBC,  LACTICIDVEN Microbiology Recent Results (from the past 240 hour(s))  SARS Coronavirus 2 (CEPHEID - Performed in [place] hospital lab), Hosp Order     Status: None   Collection Time: 12/11/18  9:55 PM   Specimen: Nasopharyngeal Swab  Result Value Ref Range Status   SARS Coronavirus 2 NEGATIVE NEGATIVE Final    Comment: (NOTE) If result is NEGATIVE SARS-CoV-2 target nucleic acids are NOT DETECTED. The SARS-CoV-2 RNA is generally detectable in upper and lower  respiratory specimens during the acute phase of infection. The lowest  concentration of SARS-CoV-2 viral copies this assay can detect is  250  copies / mL. A negative result does not preclude SARS-CoV-2 infection  and should not be used as the sole basis for treatment or other  patient management decisions.  A negative result may occur with  improper specimen collection / handling, submission of specimen other  than nasopharyngeal swab, presence of viral mutation(s) within the  areas targeted by this assay, and inadequate number of viral copies  (<250 copies / mL). A negative result must be combined with clinical  observations, patient history, and epidemiological information. If result is POSITIVE SARS-CoV-2 target nucleic acids are DETECTED. The SARS-CoV-2 RNA is generally detectable in upper and lower  respiratory specimens dur ing the acute phase of infection.  Positive  results are indicative of active infection with SARS-CoV-2.  Clinical  correlation with patient history and other diagnostic information is  necessary to determine patient infection status.  Positive results do  not rule out bacterial infection or co-infection with other viruses. If result is PRESUMPTIVE POSTIVE SARS-CoV-2 nucleic acids MAY BE PRESENT.   A presumptive positive result was obtained on the submitted specimen  and confirmed on repeat testing.  While 2019 novel coronavirus  (SARS-CoV-2) nucleic acids may be present in the submitted sample  additional confirmatory testing may be necessary for epidemiological  and / or clinical management purposes  to differentiate between  SARS-CoV-2 and other Sarbecovirus currently known to infect humans.  If clinically indicated additional testing with an alternate test  methodology [phone]) is advised. The SARS-CoV-2 RNA is generally  detectable in upper and lower respiratory sp ecimens during the acute  phase of infection. The expected result is Negative. Fact Sheet for Patients:  StrictlyIdeas.no Fact Sheet for Healthcare  Providers: BankingDealers.co.za This test is not yet approved or cleared by the [place] FDA and has been authorized for detection and/or diagnosis of SARS-CoV-2 by FDA under an Emergency Use Authorization (EUA).  This EUA will remain in effect (meaning this test can be used) for the duration of the COVID-19 declaration under Section 564(b)(1) of the Act, 21 U.S.C. section 360bbb-3(b)(1), unless the authorization is terminated or revoked sooner. Performed at [facility], [place] [address] [place], [place] [address]   Urine Culture  Status: None   Collection Time: 12/12/18  4:14 AM   Specimen: Urine, Random  Result Value Ref Range Status   Specimen Description   Final    URINE, RANDOM Performed at [facility] [address] [place], [place] [address]    Special Requests   Final    NONE Performed at [facility], [place] [address] [place], [place] [address]    Culture   Final    Multiple bacterial morphotypes present, none predominant. Suggest appropriate recollection if clinically indicated.   Report Status 12/14/2018 FINAL  Final     Time coordinating discharge: 34  minutes  SIGNED:   Hosie Poisson, MD  [facility] 12/16/2018, 1:29 PM Pager   If 7PM-7AM, please contact night-coverage [email or web address] Password [email or web address]

## 2018-12-16 NOTE — Progress Notes (Signed)
Brief Oncology Note:  Nausea and vomiting better. Still very weak. She will discharge to SNF for short-term rehabilitation. Awaiting a bed at this point. Recommend continuation of Tagrisso 160 mg daily upon discharge. Keep follow-up with Dr. Julien Nordmann as previously scheduled for 12/19/2018.  Mikey Bussing, DNP, AGPCNP-BC, AOCNP

## 2018-12-16 NOTE — Progress Notes (Signed)
Physical Therapy Treatment Patient Details Name: Debra Hodges MRN: [id number] DOB: [birth year] Today's Date: 12/16/2018    History of Present Illness 68 y.o. female admitted with near syncope, dizziness, and lightheadedness when she tries to stand up. Dx: likely due to orthostasis, dehydration, intractable nausea and vomiting; AKI.  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    PT Comments    Vestibular order received however pt's dizziness is described more as syncope episode.  Pt reports no dizziness today and mostly reported nausea with standing EOB today.  Pt denies dizziness/spinning at home and reports sensation as "blacking out" or syncopal episode.  Symptoms likely either related to dehydration, orthostasis, or hx of brain mets.  Difficult to perform vestibular testing due to pt's right eye.  Pt kept right eye closed most of session. Pt able to open eye with commands and eye appears red however unable to remain open, so RN notified as pt reports this started today.  Pt plans to d/c to SNF.   Follow Up Recommendations  SNF     Equipment Recommendations  None recommended by PT    Recommendations for Other Services       Precautions / Restrictions Precautions Precautions: Fall    Mobility  Bed Mobility Overal bed mobility: Needs Assistance Bed Mobility: Supine to Sit;Sit to Supine     Supine to sit: Min assist Sit to supine: Mod assist   General bed mobility comments: Assist for trunk upright and LEs onto bed. Increased time. Pt relied on bedrail. Cues for safety, technique. no symptoms with bed mobility today  Transfers Overall transfer level: Needs assistance Equipment used: Rolling walker (2 wheeled) Transfers: Sit to/from Stand Sit to Stand: Mod assist         General transfer comment: assist to rise and steady, pt able to stand briefly however returned to sitting due to nausea and requested back to bed, pt stated no to  dizziness when standing and then reports "maybe dizziness due to nausea" later  Ambulation/Gait                 Stairs             Wheelchair Mobility    Modified Rankin (Stroke Patients Only)       Balance Overall balance assessment: Needs assistance   Sitting balance-Leahy Scale: Good     Standing balance support: Bilateral upper extremity supported Standing balance-Leahy Scale: Poor                              Cognition Arousal/Alertness: Awake/alert Behavior During Therapy: Flat affect Overall Cognitive Status: No family/caregiver present to determine baseline cognitive functioning                                 General Comments: h/o whole brain radiation       Exercises      General Comments        Pertinent Vitals/Pain Pain Assessment: No/denies pain    Home Living                      Prior Function            PT Goals (current goals can now be found in the care plan section) Progress towards PT goals: Progressing toward goals    Frequency  Min 2X/week      PT Plan Current plan remains appropriate    Co-evaluation              AM-PAC PT "6 Clicks" Mobility   Outcome Measure  Help needed turning from your back to your side while in a flat bed without using bedrails?: A Little Help needed moving from lying on your back to sitting on the side of a flat bed without using bedrails?: A Lot Help needed moving to and from a bed to a chair (including a wheelchair)?: A Lot Help needed standing up from a chair using your arms (e.g., wheelchair or bedside chair)?: A Lot Help needed to walk in hospital room?: Total Help needed climbing 3-5 steps with a railing? : Total 6 Click Score: 11    End of Session Equipment Utilized During Treatment: Gait belt Activity Tolerance: Patient limited by fatigue Patient left: in bed;with call bell/phone within reach;with bed alarm set   PT Visit  Diagnosis: History of falling (Z91.81);Muscle weakness (generalized) (M62.81)     Time: 1356-1410 PT Time Calculation (min) (ACUTE ONLY): 14 min  Charges:  $Therapeutic Activity: 8-22 mins                    Carmelia Bake, PT, DPT Acute Rehabilitation Services Office: [phone] Pager: [phone]  Trena Platt 12/16/2018, 3:34 PM

## 2018-12-17 DIAGNOSIS — R112 Nausea with vomiting, unspecified: Secondary | ICD-10-CM | POA: Diagnosis not present

## 2018-12-17 DIAGNOSIS — R52 Pain, unspecified: Secondary | ICD-10-CM | POA: Diagnosis not present

## 2018-12-17 DIAGNOSIS — Z7401 Bed confinement status: Secondary | ICD-10-CM | POA: Diagnosis not present

## 2018-12-17 DIAGNOSIS — L98419 Non-pressure chronic ulcer of buttock with unspecified severity: Secondary | ICD-10-CM | POA: Diagnosis not present

## 2018-12-17 DIAGNOSIS — M25511 Pain in right shoulder: Secondary | ICD-10-CM | POA: Diagnosis not present

## 2018-12-17 DIAGNOSIS — Z7901 Long term (current) use of anticoagulants: Secondary | ICD-10-CM | POA: Diagnosis not present

## 2018-12-17 DIAGNOSIS — C3490 Malignant neoplasm of unspecified part of unspecified bronchus or lung: Secondary | ICD-10-CM | POA: Diagnosis not present

## 2018-12-17 DIAGNOSIS — G9009 Other idiopathic peripheral autonomic neuropathy: Secondary | ICD-10-CM | POA: Diagnosis not present

## 2018-12-17 DIAGNOSIS — N179 Acute kidney failure, unspecified: Secondary | ICD-10-CM | POA: Diagnosis not present

## 2018-12-17 DIAGNOSIS — M6281 Muscle weakness (generalized): Secondary | ICD-10-CM | POA: Diagnosis not present

## 2018-12-17 DIAGNOSIS — R55 Syncope and collapse: Secondary | ICD-10-CM | POA: Diagnosis not present

## 2018-12-17 DIAGNOSIS — B372 Candidiasis of skin and nail: Secondary | ICD-10-CM | POA: Diagnosis not present

## 2018-12-17 DIAGNOSIS — E876 Hypokalemia: Secondary | ICD-10-CM | POA: Diagnosis not present

## 2018-12-17 DIAGNOSIS — M7551 Bursitis of right shoulder: Secondary | ICD-10-CM | POA: Diagnosis not present

## 2018-12-17 DIAGNOSIS — L304 Erythema intertrigo: Secondary | ICD-10-CM | POA: Diagnosis not present

## 2018-12-17 DIAGNOSIS — M2550 Pain in unspecified joint: Secondary | ICD-10-CM | POA: Diagnosis not present

## 2018-12-17 DIAGNOSIS — E860 Dehydration: Secondary | ICD-10-CM | POA: Diagnosis not present

## 2018-12-17 DIAGNOSIS — Z86718 Personal history of other venous thrombosis and embolism: Secondary | ICD-10-CM | POA: Diagnosis not present

## 2018-12-17 DIAGNOSIS — R262 Difficulty in walking, not elsewhere classified: Secondary | ICD-10-CM | POA: Diagnosis not present

## 2018-12-17 DIAGNOSIS — R110 Nausea: Secondary | ICD-10-CM | POA: Diagnosis not present

## 2018-12-17 DIAGNOSIS — L89153 Pressure ulcer of sacral region, stage 3: Secondary | ICD-10-CM | POA: Diagnosis not present

## 2018-12-17 DIAGNOSIS — R627 Adult failure to thrive: Secondary | ICD-10-CM | POA: Diagnosis not present

## 2018-12-17 DIAGNOSIS — I10 Essential (primary) hypertension: Secondary | ICD-10-CM | POA: Diagnosis not present

## 2018-12-17 DIAGNOSIS — E039 Hypothyroidism, unspecified: Secondary | ICD-10-CM | POA: Diagnosis not present

## 2018-12-17 DIAGNOSIS — R5381 Other malaise: Secondary | ICD-10-CM | POA: Diagnosis not present

## 2018-12-17 LAB — SARS CORONAVIRUS 2 BY RT PCR (HOSPITAL ORDER, PERFORMED IN ~~LOC~~ HOSPITAL LAB): SARS Coronavirus 2: NEGATIVE

## 2018-12-17 NOTE — Discharge Summary (Signed)
Physician Discharge Summary  Debra Hodges XLK:[id number] DOB: [birth year] DOA: 12/11/2018  PCP: No primary care provider on file.  Admit date: 12/11/2018 Discharge date: 12/17/2018  Admitted From: Home.  Disposition:  Home.   Recommendations for Outpatient Follow-up:  1. Follow up with PCP in 1-2 weeks 2. Please obtain BMP/CBC in one week Please follow up with Dr Julien Nordmann as scheduled.   Discharge Condition:stable.  CODE STATUS:full code.  Diet recommendation: Heart Healthy  Brief/Interim Summary: Patient is a 68 year old female with history of metastatic non-small cell lung cancer, metastatic to liver, brain on oral chemotherapy, hypertension, DVT since 2019, on Eliquis presented with near syncope, dizziness and lightheadedness when she tries to stand up.  The episodes are associated with worsening nausea and vomiting.  No fevers or chills, chest pain or shortness of breath.  Patient states she gets very lightheaded when she tries to stand up from her chair and has to sit back down very quickly.  She has history of syncope in the past and was diagnosed with mets to the brain now finished radiation therapy  Discharge Diagnoses:  Principal Problem:   Syncope Active Problems:   Hypothyroidism   Non-small cell lung cancer with metastasis (HCC)   Intractable nausea and vomiting   AKI (acute kidney injury) ([place])   Dehydration   Hypokalemia  Near syncope: -Likely secondary to orthostasis, dehydration, intractable nausea and vomiting. - significant generalized debility, deconditioning needing moderate assistance, will need SNF for continued physical therapy.  -No chest pain or shortness of breath,. - oncology suggests to defer another MRI as it was done less than a month ago.    Patient has known history of mets to brain. -2D echo showed EF of 55%, normal right ventricular systolic function, left ventricular diastolic parameters consistent with impaired relaxation. -Will place TED  hose, meclizine 3 times daily    Intractable nausea and vomiting -Still has nausea, states overnight one episode of vomiting, continue soft solids -Continue antiemetics as needed  Acute kidney injury -pre-renal secondary to nausea vomiting and dehydration -Baseline creatinine 0.53 in 10/2018, presented with creatinine of 1.3 -Creatinine at baseline    Hypothyroidism -TSH very suppressed to less than 0.010, T4 elevated 2.36 -Decreased Synthroid dose to 25 MCG daily    Non-small cell lung cancer with metastasis to liver and brain ([place]), stage IV adenocarcinoma, diagnosed in 2015 -Hold repeating MRI of the brain, last on 11/18/2018 had shown regression of the metastatic disease to the brain following whole brain XRT -Oncology following, repeat staging CT chest abdomen and pelvis done, liver metastatic lesions, stable appearance of primarily therapy related findings in left perihilar region, air-fluid levels in the colon suggesting diarrheal process.  Stable right thyroid nodule.  Bilateral nonobstructive nephrolithiasis.  History of DVT -Continue Eliquis  Hypokalemia -Resolved  Failure to thrive, generalized debility -PT recommended skilled nursing facility versus home health.  Patient feels her husband will be unable to take care of her with the level of assistance she needs currently  Left upper arm swelling Improving  Moderate protein calorie malnutrition Continue nutritional supplements, pro-stat   Discharge Instructions  Discharge Instructions    Diet - low sodium heart healthy   Complete by: As directed    Discharge instructions   Complete by: As directed    PLEASE follow up with Dr Julien Nordmann as recommended.     Allergies as of 12/17/2018      Reactions   Peanut-containing Drug Products    Excessive mucous   Penicillins  Hives, Childhood Allergy Has patient had a PCN reaction causing immediate rash, facial/tongue/throat swelling, SOB or lightheadedness  with hypotension: Yes Has patient had a PCN reaction causing severe rash involving mucus membranes or skin necrosis: No Has patient had a PCN reaction that required hospitalization: No Has patient had a PCN reaction occurring within the last 10 years: No If all of the above answers are "NO", then may proceed with Cephalosporin use.      Medication List    STOP taking these medications   Climara 0.1 mg/24hr patch Generic drug: estradiol   Clinpro 5000 1.1 % Pste Generic drug: Sodium Fluoride   oxyCODONE-acetaminophen 5-325 MG tablet Commonly known as: PERCOCET/ROXICET   prochlorperazine 10 MG tablet Commonly known as: COMPAZINE   spironolactone 25 MG tablet Commonly known as: ALDACTONE     TAKE these medications   acetaminophen 325 MG tablet Commonly known as: TYLENOL Take 650 mg by mouth every 6 (six) hours as needed for mild pain. Reported on 06/15/2015   calcium carbonate 600 MG Tabs tablet Commonly known as: OS-CAL Take 600 mg by mouth daily.   Eliquis 5 MG Tabs tablet Generic drug: apixaban TAKE 1 TABLET(5 MG) BY MOUTH TWICE DAILY What changed: See the new instructions.   feeding supplement (ENSURE ENLIVE) Liqd Take 237 mLs by mouth 2 (two) times daily between meals.   feeding supplement (PRO-STAT SUGAR FREE 64) Liqd Take 30 mLs by mouth 2 (two) times daily.   gabapentin 100 MG capsule Commonly known as: NEURONTIN TAKE 3 CAPSULES(300 MG) BY MOUTH THREE TIMES DAILY What changed: See the new instructions.   levothyroxine 25 MCG tablet Commonly known as: SYNTHROID Take 1 tablet (25 mcg total) by mouth at bedtime. What changed:   medication strength  how much to take   loperamide 2 MG capsule Commonly known as: IMODIUM Take 2 mg by mouth daily as needed for diarrhea or loose stools. Reported on 11/22/2015   ONE-A-DAY WOMENS PO Take 1 tablet by mouth daily.   osimertinib mesylate 80 MG tablet Commonly known as: Tagrisso Take 2 tablets (160 mg total)  by mouth daily.   potassium chloride SA 20 MEQ tablet Commonly known as: K-DUR Take 1 tablet (20 mEq total) by mouth at bedtime.   vitamin C 500 MG tablet Commonly known as: ASCORBIC ACID Take 500 mg by mouth daily.      Follow-up Information    Curt Bears, MD Follow up.   Specialty: Oncology Why: as scheduled  Contact information: [address]  [place] [address] [phone]          Allergies  Allergen Reactions  . Peanut-Containing Drug Products     Excessive mucous  . Penicillins     Hives, Childhood Allergy Has patient had a PCN reaction causing immediate rash, facial/tongue/throat swelling, SOB or lightheadedness with hypotension: Yes Has patient had a PCN reaction causing severe rash involving mucus membranes or skin necrosis: No Has patient had a PCN reaction that required hospitalization: No Has patient had a PCN reaction occurring within the last 10 years: No If all of the above answers are "NO", then may proceed with Cephalosporin use.     Consultations:  Oncology.    Procedures/Studies: Ct Chest W Contrast  Result Date: 12/12/2018 CLINICAL DATA:  Metastatic non-small cell lung cancer restaging. Syncope. Intermittent vomiting. EXAM: CT CHEST, ABDOMEN, AND PELVIS WITH CONTRAST TECHNIQUE: Multidetector CT imaging of the chest, abdomen and pelvis was performed following the standard protocol during bolus administration of  intravenous contrast. CONTRAST:  15mL OMNIPAQUE IOHEXOL 300 MG/ML  SOLN COMPARISON:  Multiple exams, including 09/09/2018 FINDINGS: CT CHEST FINDINGS Cardiovascular: Atherosclerotic calcification of the aortic arch branch vessels. Mediastinum/Nodes: Calcified right anterior thyroid nodule, stable. Lungs/Pleura: Biapical pleuroparenchymal scarring. Similar morphology of left perihilar radiation therapy related findings including consolidation in the left upper lobe and left lower lobe perihilar regions as well as volume  loss. Left suprahilar consolidation/nodularity is stable at 2.1 cm transverse on image 48/7. Stable 3 mm left upper lobe nodule on image 36/7. Musculoskeletal: Thoracic spondylosis. CT ABDOMEN PELVIS FINDINGS Hepatobiliary: Segment 2 hypodense lesion 1.4 by 1.1 cm, formerly 1.8 by 1.5 cm. Hypodense right hepatic lobe lesion on image 40/2 measures 1.1 by 1.1 cm, formerly 1.4 by 1.3 cm. In segment 4 of the liver, a 2.0 by 1.7 cm lesion on image 40/2 was previously indistinct but less striking, probably measuring about 1.8 by 1.5 cm. Additional indistinct hypodense lesion in segment 3 is roughly stable. Gallbladder unremarkable.  No biliary dilatation. Pancreas: Unremarkable Spleen: Stable fluid density lesion of the splenic hilum on image 50/2, most likely benign. Adrenals/Urinary Tract: Both adrenal glands appear normal. 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 Small hypodense lesion of the right mid kidney 2 mm right mid kidney nonobstructive renal calculus. 5 mm left mid to lower kidney nonobstructive renal calculus. Stomach/Bowel: Air-fluid levels in the distal colon suggesting diarrheal process. Otherwise unremarkable. Vascular/Lymphatic: Aortoiliac atherosclerotic vascular disease. Reproductive: Uterus absent.  Adnexa unremarkable. Other: 2 metal clips are noted along the porta hepatis, possibly fiducials, correlate with patient history. Musculoskeletal: Grade 1 degenerative anterolisthesis at L4-5 with associated degenerative facet arthropathy. IMPRESSION: 1. Mixed appearance of the liver metastatic lesions, with 2 of the hypodense lesions appearing smaller, but 1 of the hypodense lesions appearing slightly larger. 2. Stable appearance of primarily therapy related findings around the left perihilar region. Any underlying tumor hidden by the surrounding post therapy related findings has not progressed in the interim. 3. Air fluid levels in the colon suggests diarrheal process. 4. Other imaging  findings of potential clinical significance: Aortic Atherosclerosis (ICD10-I70.0). Stable right thyroid nodule. 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 Bilateral nonobstructive nephrolithiasis. Electronically Signed   By: Van Clines M.D.   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 Status post whole brain radiation in May. EXAM: MRI HEAD WITHOUT AND WITH CONTRAST TECHNIQUE: Multiplanar, multiecho pulse sequences of the brain and surrounding structures were obtained without and with intravenous contrast. CONTRAST:  7 milliliters Gadavist COMPARISON:  Chat in hospital brain MRI 09/20/2018. FINDINGS: Brain: Stable cerebral volume. No restricted diffusion to suggest acute infarction. No midline shift, mass effect, ventriculomegaly, extra-axial collection or acute intracranial hemorrhage. Cervicomedullary junction and pituitary are within normal limits. Regression of scattered abnormal T2 and FLAIR hyperintensity which was associated with the predominantly left 0 meningeal appearing enhancing metastatic disease in April. Accordingly, virtually all of the areas of enhancing metastatic disease demonstrated on 09/21/2018 have regressed. There are 1 or 2 punctate areas of enhancement in the cerebellum which appear stable (such as in the left hemisphere on series 11, image 17). No new brain metastasis is identified. Vascular: Major intracranial vascular flow voids are stable. The major dural venous sinuses are enhancing and appear to be patent. Skull and upper cervical spine: Negative visible cervical spine and  spinal cord. Stable bone marrow signal, within normal limits. Sinuses/Orbits: Negative orbits. Paranasal sinuses and mastoids are stable and well pneumatized. Other: Visible internal auditory structures appear normal. Scalp and face soft  tissues appear negative. IMPRESSION: 1. Regression of metastatic disease to the brain following whole brain radiation. 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 2. No new intracranial abnormality. Electronically Signed   By: Genevie Ann M.D.   On: 11/18/2018 13:21   Ct Abdomen Pelvis W Contrast  Result Date: 12/12/2018 : Please see accession [id number] for the complete report of CT chest, abdomen, and pelvis with contrast. Electronically Signed   By: Van Clines M.D.   On: 12/12/2018 15:27      Subjective: No nausea, vomiting or dizziness.   Discharge Exam: Vitals:   12/16/18 2200 12/17/18 0634  BP: 121/63 126/68  Pulse: 86 86  Resp: 16 16  Temp: 98.8 F (37.1 C) 99.3 F (37.4 C)  SpO2: 98% 97%   Vitals:   12/16/18 0605 12/16/18 1329 12/16/18 2200 12/17/18 0634  BP: 130/63 126/62 121/63 126/68  Pulse: 87 85 86 86  Resp: 20 16 16 16   Temp: 98.5 F (36.9 C) 98.9 F (37.2 C) 98.8 F (37.1 C) 99.3 F (37.4 C)  TempSrc: Oral Oral Oral Oral  SpO2: 97% 98% 98% 97%  Weight:      Height:        General: Pt is alert, awake, not in acute distress Cardiovascular: RRR, S1/S2 +, no rubs, no gallops Respiratory: CTA bilaterally, no wheezing, no rhonchi Abdominal: Soft, NT, ND, bowel sounds + Extremities: no edema, no cyanosis    The results of significant diagnostics from this hospitalization (including imaging, microbiology, ancillary and laboratory) are listed below for reference.     Microbiology: Recent Results (from the past 240 hour(s))  SARS Coronavirus 2 (CEPHEID - Performed in [facility] hospital lab), Hosp Order     Status: None   Collection Time: 12/11/18  9:55 PM   Specimen: Nasopharyngeal Swab  Result Value Ref Range Status   SARS Coronavirus 2 NEGATIVE NEGATIVE Final    Comment: (NOTE) If result is NEGATIVE SARS-CoV-2 target nucleic acids are NOT DETECTED. The SARS-CoV-2 RNA is generally  detectable in upper and lower  respiratory specimens during the acute phase of infection. The lowest  concentration of SARS-CoV-2 viral copies this assay can detect is 250  copies / mL. A negative result does not preclude SARS-CoV-2 infection  and should not be used as the sole basis for treatment or other  patient management decisions.  A negative result may occur with  improper specimen collection / handling, submission of specimen other  than nasopharyngeal swab, presence of viral mutation(s) within the  areas targeted by this assay, and inadequate number of viral copies  (<250 copies / mL). A negative result must be combined with clinical  observations, patient history, and epidemiological information. If result is POSITIVE SARS-CoV-2 target nucleic acids are DETECTED. The SARS-CoV-2 RNA is generally detectable in upper and lower  respiratory specimens dur ing the acute phase of infection.  Positive  results are indicative of active infection with SARS-CoV-2.  Clinical  correlation with patient history and other diagnostic information is  necessary to determine patient infection status.  Positive results do  not rule out bacterial infection or co-infection with other viruses. If result is PRESUMPTIVE POSTIVE SARS-CoV-2 nucleic acids MAY BE PRESENT.   A presumptive positive result was obtained on the submitted  specimen  and confirmed on repeat testing.  While 2019 novel coronavirus  (SARS-CoV-2) nucleic acids may be present in the submitted sample  additional confirmatory testing may be necessary for epidemiological  and / or clinical management purposes  to differentiate between  SARS-CoV-2 and other Sarbecovirus currently known to infect humans.  If clinically indicated additional testing with an alternate test  methodology [phone]) is advised. The SARS-CoV-2 RNA is generally  detectable in upper and lower respiratory sp ecimens during the acute  phase of infection. The  expected result is Negative. Fact Sheet for Patients:  StrictlyIdeas.no Fact Sheet for Healthcare Providers: BankingDealers.co.za This test is not yet approved or cleared by the [place] FDA and has been authorized for detection and/or diagnosis of SARS-CoV-2 by FDA under an Emergency Use Authorization (EUA).  This EUA will remain in effect (meaning this test can be used) for the duration of the COVID-19 declaration under Section 564(b)(1) of the Act, 21 U.S.C. section 360bbb-3(b)(1), unless the authorization is terminated or revoked sooner. Performed at [facility], [place] [address], [place], [place] [address]   Urine Culture     Status: None   Collection Time: 12/12/18  4:14 AM   Specimen: Urine, Random  Result Value Ref Range Status   Specimen Description   Final    URINE, RANDOM Performed at [facility] [address], [place], [place] [address]    Special Requests   Final    NONE Performed at [facility], [place] [address], [place], [place] [address]    Culture   Final    Multiple bacterial morphotypes present, none predominant. Suggest appropriate recollection if clinically indicated.   Report Status 12/14/2018 FINAL  Final  SARS Coronavirus 2 (CEPHEID - Performed in [place] hospital lab), Hosp Order     Status: None   Collection Time: 12/17/18 12:01 PM   Specimen: Nasopharyngeal Swab  Result Value Ref Range Status   SARS Coronavirus 2 NEGATIVE NEGATIVE Final    Comment: (NOTE) If result is NEGATIVE SARS-CoV-2 target nucleic acids are NOT DETECTED. The SARS-CoV-2 RNA is generally detectable in upper and lower  respiratory specimens during the acute phase of infection. The lowest  concentration of SARS-CoV-2 viral copies this assay can detect is 250  copies / mL. A negative result does not preclude SARS-CoV-2 infection  and should not be used as the sole basis  for treatment or other  patient management decisions.  A negative result may occur with  improper specimen collection / handling, submission of specimen other  than nasopharyngeal swab, presence of viral mutation(s) within the  areas targeted by this assay, and inadequate number of viral copies  (<250 copies / mL). A negative result must be combined with clinical  observations, patient history, and epidemiological information. If result is POSITIVE SARS-CoV-2 target nucleic acids are DETECTED. The SARS-CoV-2 RNA is generally detectable in upper and lower  respiratory specimens dur ing the acute phase of infection.  Positive  results are indicative of active infection with SARS-CoV-2.  Clinical  correlation with patient history and other diagnostic information is  necessary to determine patient infection status.  Positive results do  not rule out bacterial infection or co-infection with other viruses. If result is PRESUMPTIVE POSTIVE SARS-CoV-2 nucleic acids MAY BE PRESENT.   A presumptive positive result was obtained on the submitted specimen  and confirmed on repeat testing.  While 2019 novel coronavirus  (SARS-CoV-2) nucleic acids may be present in  the submitted sample  additional confirmatory testing may be necessary for epidemiological  and / or clinical management purposes  to differentiate between  SARS-CoV-2 and other Sarbecovirus currently known to infect humans.  If clinically indicated additional testing with an alternate test  methodology [phone]) is advised. The SARS-CoV-2 RNA is generally  detectable in upper and lower respiratory sp ecimens during the acute  phase of infection. The expected result is Negative. Fact Sheet for Patients:  StrictlyIdeas.no Fact Sheet for Healthcare Providers: BankingDealers.co.za This test is not yet approved or cleared by the [place] FDA and has been authorized for detection and/or  diagnosis of SARS-CoV-2 by FDA under an Emergency Use Authorization (EUA).  This EUA will remain in effect (meaning this test can be used) for the duration of the COVID-19 declaration under Section 564(b)(1) of the Act, 21 U.S.C. section 360bbb-3(b)(1), unless the authorization is terminated or revoked sooner. Performed at [facility], [place] [address] [place], [place] [address]      Labs: BNP (last 3 results) Recent Labs    03/06/18 2209  BNP 41.3   Basic Metabolic Panel: Recent Labs  Lab 12/11/18 2146 12/12/18 0353 12/13/18 0439 12/14/18 0548 12/15/18 0518 12/16/18 0501  NA  --  136 140 140 141 141  K  --  3.8 3.8 3.9 3.6 3.6  CL  --  101 109 108 107 108  CO2  --  29 24 26 25 27   GLUCOSE  --  102* 85 91 81 87  BUN  --  27* 10 9 6* 7*  CREATININE  --  0.77 0.52 0.51 0.51 0.50  CALCIUM  --  7.6* 7.8* 7.9* 8.1* 8.0*  MG 1.9 1.9  --   --   --   --   PHOS 3.7 2.6  --   --   --   --    Liver Function Tests: Recent Labs  Lab 12/12/18 0353  AST 24  ALT 11  ALKPHOS 79  BILITOT 1.1  PROT 5.4*  ALBUMIN 2.5*   No results for input(s): LIPASE, AMYLASE in the last 168 hours. No results for input(s): AMMONIA in the last 168 hours. CBC: Recent Labs  Lab 12/11/18 1907 12/12/18 0353 12/14/18 0548 12/15/18 0518 12/16/18 0501  WBC 5.5 4.4 2.9* 2.6* 3.0*  HGB 11.6* 10.0* 9.2* 9.5* 9.4*  HCT 35.8* 31.7* 30.2* 30.7* 30.8*  MCV 92.5 93.8 95.3 93.9 94.5  PLT 98* 69* 64* 64* 65*   Cardiac Enzymes: No results for input(s): CKTOTAL, CKMB, CKMBINDEX, TROPONINI in the last 168 hours. BNP: Invalid input(s): POCBNP CBG: Recent Labs  Lab 12/11/18 1920 12/13/18 1816  GLUCAP 150* 98   D-Dimer No results for input(s): DDIMER in the last 72 hours. Hgb A1c No results for input(s): HGBA1C in the last 72 hours. Lipid Profile No results for input(s): CHOL, HDL, LDLCALC, TRIG, CHOLHDL, LDLDIRECT in the last 72 hours. Thyroid function studies No results  for input(s): TSH, T4TOTAL, T3FREE, THYROIDAB in the last 72 hours.  Invalid input(s): FREET3 Anemia work up Recent Labs    12/15/18 0518  VITAMINB12 897  FOLATE 8.3   Urinalysis    Component Value Date/Time   COLORURINE AMBER (A) 12/12/2018 0414   APPEARANCEUR HAZY (A) 12/12/2018 0414   LABSPEC 1.017 12/12/2018 0414   PHURINE 6.0 12/12/2018 [place] 12/12/2018 0414   HGBUR LARGE (A) 12/12/2018 [place] NEGATIVE 12/12/2018 [place] 12/12/2018 [place]  30 (A) 12/12/2018 0414   NITRITE NEGATIVE 12/12/2018 0414   LEUKOCYTESUR MODERATE (A) 12/12/2018 0414   Sepsis Labs Invalid input(s): PROCALCITONIN,  WBC,  LACTICIDVEN Microbiology Recent Results (from the past 240 hour(s))  SARS Coronavirus 2 (CEPHEID - Performed in [place] hospital lab), Hosp Order     Status: None   Collection Time: 12/11/18  9:55 PM   Specimen: Nasopharyngeal Swab  Result Value Ref Range Status   SARS Coronavirus 2 NEGATIVE NEGATIVE Final    Comment: (NOTE) If result is NEGATIVE SARS-CoV-2 target nucleic acids are NOT DETECTED. The SARS-CoV-2 RNA is generally detectable in upper and lower  respiratory specimens during the acute phase of infection. The lowest  concentration of SARS-CoV-2 viral copies this assay can detect is 250  copies / mL. A negative result does not preclude SARS-CoV-2 infection  and should not be used as the sole basis for treatment or other  patient management decisions.  A negative result may occur with  improper specimen collection / handling, submission of specimen other  than nasopharyngeal swab, presence of viral mutation(s) within the  areas targeted by this assay, and inadequate number of viral copies  (<250 copies / mL). A negative result must be combined with clinical  observations, patient history, and epidemiological information. If result is POSITIVE SARS-CoV-2 target nucleic acids are DETECTED. The SARS-CoV-2  RNA is generally detectable in upper and lower  respiratory specimens dur ing the acute phase of infection.  Positive  results are indicative of active infection with SARS-CoV-2.  Clinical  correlation with patient history and other diagnostic information is  necessary to determine patient infection status.  Positive results do  not rule out bacterial infection or co-infection with other viruses. If result is PRESUMPTIVE POSTIVE SARS-CoV-2 nucleic acids MAY BE PRESENT.   A presumptive positive result was obtained on the submitted specimen  and confirmed on repeat testing.  While 2019 novel coronavirus  (SARS-CoV-2) nucleic acids may be present in the submitted sample  additional confirmatory testing may be necessary for epidemiological  and / or clinical management purposes  to differentiate between  SARS-CoV-2 and other Sarbecovirus currently known to infect humans.  If clinically indicated additional testing with an alternate test  methodology [phone]) is advised. The SARS-CoV-2 RNA is generally  detectable in upper and lower respiratory sp ecimens during the acute  phase of infection. The expected result is Negative. Fact Sheet for Patients:  StrictlyIdeas.no Fact Sheet for Healthcare Providers: [employer] This test is not yet approved or cleared by the [place] FDA and has been authorized for detection and/or diagnosis of SARS-CoV-2 by FDA under an Emergency Use Authorization (EUA).  This EUA will remain in effect (meaning this test can be used) for the duration of the COVID-19 declaration under Section 564(b)(1) of the Act, 21 U.S.C. section 360bbb-3(b)(1), unless the authorization is terminated or revoked sooner. Performed at [facility], [place] [address] [place], [place] [address]   Urine Culture     Status: None   Collection Time: 12/12/18  4:14 AM   Specimen: Urine, Random  Result Value Ref  Range Status   Specimen Description   Final    URINE, RANDOM Performed at [facility] [address], [place], [place] [address]    Special Requests   Final    NONE Performed at [facility], [place] [address] [place],  [address]    Culture   Final    Multiple bacterial morphotypes  present, none predominant. Suggest appropriate recollection if clinically indicated.   Report Status 12/14/2018 FINAL  Final  SARS Coronavirus 2 (CEPHEID - Performed in [place] hospital lab), Hosp Order     Status: None   Collection Time: 12/17/18 12:01 PM   Specimen: Nasopharyngeal Swab  Result Value Ref Range Status   SARS Coronavirus 2 NEGATIVE NEGATIVE Final    Comment: (NOTE) If result is NEGATIVE SARS-CoV-2 target nucleic acids are NOT DETECTED. The SARS-CoV-2 RNA is generally detectable in upper and lower  respiratory specimens during the acute phase of infection. The lowest  concentration of SARS-CoV-2 viral copies this assay can detect is 250  copies / mL. A negative result does not preclude SARS-CoV-2 infection  and should not be used as the sole basis for treatment or other  patient management decisions.  A negative result may occur with  improper specimen collection / handling, submission of specimen other  than nasopharyngeal swab, presence of viral mutation(s) within the  areas targeted by this assay, and inadequate number of viral copies  (<250 copies / mL). A negative result must be combined with clinical  observations, patient history, and epidemiological information. If result is POSITIVE SARS-CoV-2 target nucleic acids are DETECTED. The SARS-CoV-2 RNA is generally detectable in upper and lower  respiratory specimens dur ing the acute phase of infection.  Positive  results are indicative of active infection with SARS-CoV-2.  Clinical  correlation with patient history and other diagnostic information is  necessary to determine patient  infection status.  Positive results do  not rule out bacterial infection or co-infection with other viruses. If result is PRESUMPTIVE POSTIVE SARS-CoV-2 nucleic acids MAY BE PRESENT.   A presumptive positive result was obtained on the submitted specimen  and confirmed on repeat testing.  While 2019 novel coronavirus  (SARS-CoV-2) nucleic acids may be present in the submitted sample  additional confirmatory testing may be necessary for epidemiological  and / or clinical management purposes  to differentiate between  SARS-CoV-2 and other Sarbecovirus currently known to infect humans.  If clinically indicated additional testing with an alternate test  methodology [phone]) is advised. The SARS-CoV-2 RNA is generally  detectable in upper and lower respiratory sp ecimens during the acute  phase of infection. The expected result is Negative. Fact Sheet for Patients:  StrictlyIdeas.no Fact Sheet for Healthcare Providers: [employer] This test is not yet approved or cleared by the [place] FDA and has been authorized for detection and/or diagnosis of SARS-CoV-2 by FDA under an Emergency Use Authorization (EUA).  This EUA will remain in effect (meaning this test can be used) for the duration of the COVID-19 declaration under Section 564(b)(1) of the Act, 21 U.S.C. section 360bbb-3(b)(1), unless the authorization is terminated or revoked sooner. Performed at [facility], [place] [address], [place], [place] [address]      Time coordinating discharge: 34  minutes  SIGNED:   Hosie Poisson, MD  [facility] 12/17/2018, 2:55 PM Pager   If 7PM-7AM, please contact night-coverage [email or web address] Password [email or web address]

## 2018-12-17 NOTE — Progress Notes (Signed)
RN outreached to [facility] to see if pt was allowed to be transferred since temp was normal- 98.8. Currently awaiting response from [facility].

## 2018-12-17 NOTE — Progress Notes (Signed)
Pt discharging to [facility]. Room 108. Call report to [phone].

## 2018-12-17 NOTE — Progress Notes (Signed)
Pt had just eaten when temp reading was 99.6. Retake of temp reading 98.6. COVID test today 7/21 was negative.

## 2018-12-17 NOTE — Progress Notes (Signed)
Temp retaken by this RN reading of 98.8.

## 2018-12-19 ENCOUNTER — Inpatient Hospital Stay: Payer: Medicare Other | Admitting: Internal Medicine

## 2018-12-19 ENCOUNTER — Telehealth: Payer: Self-pay | Admitting: Internal Medicine

## 2018-12-19 DIAGNOSIS — L89153 Pressure ulcer of sacral region, stage 3: Secondary | ICD-10-CM | POA: Diagnosis not present

## 2018-12-19 NOTE — Telephone Encounter (Signed)
Scheduled appt per 7/23 sch message - left message for patient with appt date and time

## 2018-12-22 DIAGNOSIS — C3490 Malignant neoplasm of unspecified part of unspecified bronchus or lung: Secondary | ICD-10-CM | POA: Diagnosis not present

## 2018-12-22 DIAGNOSIS — Z7901 Long term (current) use of anticoagulants: Secondary | ICD-10-CM | POA: Diagnosis not present

## 2018-12-22 DIAGNOSIS — R627 Adult failure to thrive: Secondary | ICD-10-CM | POA: Diagnosis not present

## 2018-12-22 DIAGNOSIS — R112 Nausea with vomiting, unspecified: Secondary | ICD-10-CM | POA: Diagnosis not present

## 2018-12-22 DIAGNOSIS — R5381 Other malaise: Secondary | ICD-10-CM | POA: Diagnosis not present

## 2018-12-26 DIAGNOSIS — L98419 Non-pressure chronic ulcer of buttock with unspecified severity: Secondary | ICD-10-CM | POA: Diagnosis not present

## 2018-12-31 ENCOUNTER — Telehealth: Payer: Self-pay | Admitting: Medical Oncology

## 2018-12-31 ENCOUNTER — Telehealth: Payer: Self-pay | Admitting: Internal Medicine

## 2018-12-31 NOTE — Telephone Encounter (Signed)
Rehab update - Pt doing well.walking with walker.  F/U-Mohamed said to see pt in two weeks with labs. Schedule message sent.  Tagrisso-  Pt is taking her Tagrisso.

## 2018-12-31 NOTE — Telephone Encounter (Signed)
Cancelled apt on 8/06 per patient still at facility - they will call as soon as she leaves to set up an appt.

## 2019-01-02 ENCOUNTER — Inpatient Hospital Stay: Payer: Medicare Other

## 2019-01-02 ENCOUNTER — Inpatient Hospital Stay: Payer: Medicare Other | Admitting: Physician Assistant

## 2019-01-02 DIAGNOSIS — L98419 Non-pressure chronic ulcer of buttock with unspecified severity: Secondary | ICD-10-CM | POA: Diagnosis not present

## 2019-01-02 DIAGNOSIS — L89153 Pressure ulcer of sacral region, stage 3: Secondary | ICD-10-CM | POA: Diagnosis not present

## 2019-01-08 DIAGNOSIS — R5381 Other malaise: Secondary | ICD-10-CM | POA: Diagnosis not present

## 2019-01-08 DIAGNOSIS — R110 Nausea: Secondary | ICD-10-CM | POA: Diagnosis not present

## 2019-01-08 DIAGNOSIS — M6281 Muscle weakness (generalized): Secondary | ICD-10-CM | POA: Diagnosis not present

## 2019-01-08 DIAGNOSIS — L304 Erythema intertrigo: Secondary | ICD-10-CM | POA: Diagnosis not present

## 2019-01-08 DIAGNOSIS — B372 Candidiasis of skin and nail: Secondary | ICD-10-CM | POA: Diagnosis not present

## 2019-01-08 DIAGNOSIS — C3490 Malignant neoplasm of unspecified part of unspecified bronchus or lung: Secondary | ICD-10-CM | POA: Diagnosis not present

## 2019-01-09 DIAGNOSIS — L98419 Non-pressure chronic ulcer of buttock with unspecified severity: Secondary | ICD-10-CM | POA: Diagnosis not present

## 2019-01-14 DIAGNOSIS — M7551 Bursitis of right shoulder: Secondary | ICD-10-CM | POA: Diagnosis not present

## 2019-01-14 DIAGNOSIS — M25511 Pain in right shoulder: Secondary | ICD-10-CM | POA: Diagnosis not present

## 2019-01-14 DIAGNOSIS — R5381 Other malaise: Secondary | ICD-10-CM | POA: Diagnosis not present

## 2019-01-14 DIAGNOSIS — R262 Difficulty in walking, not elsewhere classified: Secondary | ICD-10-CM | POA: Diagnosis not present

## 2019-01-16 DIAGNOSIS — L98419 Non-pressure chronic ulcer of buttock with unspecified severity: Secondary | ICD-10-CM | POA: Diagnosis not present

## 2019-01-29 DIAGNOSIS — G9009 Other idiopathic peripheral autonomic neuropathy: Secondary | ICD-10-CM | POA: Diagnosis not present

## 2019-01-29 DIAGNOSIS — I10 Essential (primary) hypertension: Secondary | ICD-10-CM | POA: Diagnosis not present

## 2019-01-29 DIAGNOSIS — L89153 Pressure ulcer of sacral region, stage 3: Secondary | ICD-10-CM | POA: Diagnosis not present

## 2019-01-29 DIAGNOSIS — E039 Hypothyroidism, unspecified: Secondary | ICD-10-CM | POA: Diagnosis not present

## 2019-01-29 DIAGNOSIS — C3490 Malignant neoplasm of unspecified part of unspecified bronchus or lung: Secondary | ICD-10-CM | POA: Diagnosis not present

## 2019-01-29 DIAGNOSIS — R55 Syncope and collapse: Secondary | ICD-10-CM | POA: Diagnosis not present

## 2019-01-29 DIAGNOSIS — M6281 Muscle weakness (generalized): Secondary | ICD-10-CM | POA: Diagnosis not present

## 2019-01-29 DIAGNOSIS — R52 Pain, unspecified: Secondary | ICD-10-CM | POA: Diagnosis not present

## 2019-01-31 DIAGNOSIS — E038 Other specified hypothyroidism: Secondary | ICD-10-CM | POA: Diagnosis not present

## 2019-01-31 DIAGNOSIS — C3490 Malignant neoplasm of unspecified part of unspecified bronchus or lung: Secondary | ICD-10-CM | POA: Diagnosis not present

## 2019-01-31 DIAGNOSIS — M6281 Muscle weakness (generalized): Secondary | ICD-10-CM | POA: Diagnosis not present

## 2019-01-31 DIAGNOSIS — G9009 Other idiopathic peripheral autonomic neuropathy: Secondary | ICD-10-CM | POA: Diagnosis not present

## 2019-01-31 DIAGNOSIS — R55 Syncope and collapse: Secondary | ICD-10-CM | POA: Diagnosis not present

## 2019-01-31 DIAGNOSIS — Z7901 Long term (current) use of anticoagulants: Secondary | ICD-10-CM | POA: Diagnosis not present

## 2019-01-31 DIAGNOSIS — Z86718 Personal history of other venous thrombosis and embolism: Secondary | ICD-10-CM | POA: Diagnosis not present

## 2019-01-31 DIAGNOSIS — I10 Essential (primary) hypertension: Secondary | ICD-10-CM | POA: Diagnosis not present

## 2019-01-31 DIAGNOSIS — L89153 Pressure ulcer of sacral region, stage 3: Secondary | ICD-10-CM | POA: Diagnosis not present

## 2019-01-31 DIAGNOSIS — R52 Pain, unspecified: Secondary | ICD-10-CM | POA: Diagnosis not present

## 2019-02-04 ENCOUNTER — Telehealth: Payer: Self-pay | Admitting: *Deleted

## 2019-02-04 NOTE — Telephone Encounter (Signed)
Pt spouse called, pt is home from Rehab and will need to schedule f/u with MD. Message to scheduling for lab/MD.

## 2019-02-05 ENCOUNTER — Telehealth: Payer: Self-pay | Admitting: Internal Medicine

## 2019-02-05 NOTE — Telephone Encounter (Signed)
Scheduled appt per 9/8 sch message - left message for pt with appt date and time

## 2019-02-12 ENCOUNTER — Telehealth: Payer: Self-pay | Admitting: Medical Oncology

## 2019-02-12 NOTE — Telephone Encounter (Signed)
PT/OT- PT initially declined services and now wants them . LVM that it is okay for PT and OT to evaluate and treat pt

## 2019-02-17 ENCOUNTER — Telehealth: Payer: Self-pay | Admitting: Internal Medicine

## 2019-02-17 NOTE — Telephone Encounter (Signed)
Pt called to r/s but when I spoke with patients father they wanted to cancel the appt and give [place] a call back when they have other appts set up.

## 2019-02-18 ENCOUNTER — Inpatient Hospital Stay: Payer: Medicare Other | Admitting: Internal Medicine

## 2019-02-18 ENCOUNTER — Inpatient Hospital Stay: Payer: Medicare Other

## 2019-02-21 ENCOUNTER — Telehealth: Payer: Self-pay | Admitting: *Deleted

## 2019-02-21 NOTE — Telephone Encounter (Signed)
Received vm message from pt's PT with [facility], requesting verbal order to continue PT services 1x week  x 1 week, then 2x a week x 5 weeks. Returned call and was able to leave vm message for the PT with verbal order to continue PT

## 2019-03-06 DIAGNOSIS — Z7401 Bed confinement status: Secondary | ICD-10-CM | POA: Diagnosis not present

## 2019-03-06 DIAGNOSIS — Z0000 Encounter for general adult medical examination without abnormal findings: Secondary | ICD-10-CM | POA: Diagnosis not present

## 2019-03-06 DIAGNOSIS — C3490 Malignant neoplasm of unspecified part of unspecified bronchus or lung: Secondary | ICD-10-CM | POA: Diagnosis not present

## 2019-03-06 DIAGNOSIS — Z7901 Long term (current) use of anticoagulants: Secondary | ICD-10-CM | POA: Diagnosis not present

## 2019-03-06 DIAGNOSIS — Z7189 Other specified counseling: Secondary | ICD-10-CM | POA: Diagnosis not present

## 2019-03-06 DIAGNOSIS — M6281 Muscle weakness (generalized): Secondary | ICD-10-CM | POA: Diagnosis not present

## 2019-03-06 DIAGNOSIS — D849 Immunodeficiency, unspecified: Secondary | ICD-10-CM | POA: Diagnosis not present

## 2019-03-13 DIAGNOSIS — Z7401 Bed confinement status: Secondary | ICD-10-CM | POA: Diagnosis not present

## 2019-03-13 DIAGNOSIS — M6281 Muscle weakness (generalized): Secondary | ICD-10-CM | POA: Diagnosis not present

## 2019-03-13 DIAGNOSIS — C7931 Secondary malignant neoplasm of brain: Secondary | ICD-10-CM | POA: Diagnosis not present

## 2019-03-13 DIAGNOSIS — L89312 Pressure ulcer of right buttock, stage 2: Secondary | ICD-10-CM | POA: Diagnosis not present

## 2019-03-13 DIAGNOSIS — Z86711 Personal history of pulmonary embolism: Secondary | ICD-10-CM | POA: Diagnosis not present

## 2019-03-13 DIAGNOSIS — I10 Essential (primary) hypertension: Secondary | ICD-10-CM | POA: Diagnosis not present

## 2019-03-13 DIAGNOSIS — C3490 Malignant neoplasm of unspecified part of unspecified bronchus or lung: Secondary | ICD-10-CM | POA: Diagnosis not present

## 2019-03-13 DIAGNOSIS — Z7901 Long term (current) use of anticoagulants: Secondary | ICD-10-CM | POA: Diagnosis not present

## 2019-03-13 DIAGNOSIS — C787 Secondary malignant neoplasm of liver and intrahepatic bile duct: Secondary | ICD-10-CM | POA: Diagnosis not present

## 2019-03-14 ENCOUNTER — Telehealth: Payer: Self-pay | Admitting: *Deleted

## 2019-03-14 NOTE — Telephone Encounter (Signed)
TCT patient regarding Tagrisso. No answer but was able to leave vm message for pt to call back to [facility] @ [phone]. Asking if pt is still taking Tagrisso. Awaiting call back

## 2019-03-17 NOTE — Telephone Encounter (Signed)
Received vm message from pt's husband regarding Tagrisso. He states pt does take this every evning.

## 2019-03-17 NOTE — Telephone Encounter (Signed)
TCT patient's husband re: tagrisso and f/u with Dr. Julien Nordmann. Advised that pt has not been seen here since June 2020 d/t hospitalizations/rehab etc.  Husband states that she has been home approx 3 weeks from rehab.  He states she remains bedbound, she cannot get out of bed, he has to feed her. Advised that Dr. Julien Nordmann would need to see her before continuing the Tagrisso to evaluate effectivenss and side effects. Husband states that he cannot get her here to the [place] at this time.  Advised him that Dr. Julien Nordmann recommends he stop the Tagrisso until such time as pt can come in.  He voiced understanding.  Asked husband about how he was getting the [place]. He states [employer] delivers every month. Will contact [employer] to advise that pt's Tagrisso is on hold at this time.  Husbands states PT comes 2 x a week.Marland Kitchen He is hopeful that pt will improve but it has been very slow progress. Husband tearful and states it has been very difficult for both of them the last few months. Emotional support extended.  Encouraged husband to keep in contact with [place] regarding pt's condition.  He said he would.

## 2019-03-17 NOTE — Telephone Encounter (Signed)
We have not seen the patient for long time.  She cannot take the medication without close follow-up and monitoring.  We will need to bring her for follow-up visit and evaluation soon.  Thank you.

## 2019-03-21 ENCOUNTER — Emergency Department (HOSPITAL_COMMUNITY): Payer: Medicare Other

## 2019-03-21 ENCOUNTER — Inpatient Hospital Stay (HOSPITAL_COMMUNITY)
Admission: EM | Admit: 2019-03-21 | Discharge: 2019-03-29 | DRG: 871 | Disposition: A | Discharge status: Hospice | Payer: Medicare Other | Attending: Internal Medicine | Admitting: Internal Medicine

## 2019-03-21 ENCOUNTER — Other Ambulatory Visit: Payer: Self-pay | Admitting: Oncology

## 2019-03-21 ENCOUNTER — Other Ambulatory Visit: Payer: Self-pay

## 2019-03-21 DIAGNOSIS — T451X5A Adverse effect of antineoplastic and immunosuppressive drugs, initial encounter: Secondary | ICD-10-CM | POA: Diagnosis present

## 2019-03-21 DIAGNOSIS — E876 Hypokalemia: Secondary | ICD-10-CM | POA: Diagnosis present

## 2019-03-21 DIAGNOSIS — Z86718 Personal history of other venous thrombosis and embolism: Secondary | ICD-10-CM

## 2019-03-21 DIAGNOSIS — E860 Dehydration: Secondary | ICD-10-CM | POA: Diagnosis not present

## 2019-03-21 DIAGNOSIS — A419 Sepsis, unspecified organism: Principal | ICD-10-CM | POA: Diagnosis present

## 2019-03-21 DIAGNOSIS — L89152 Pressure ulcer of sacral region, stage 2: Secondary | ICD-10-CM | POA: Diagnosis present

## 2019-03-21 DIAGNOSIS — Z808 Family history of malignant neoplasm of other organs or systems: Secondary | ICD-10-CM

## 2019-03-21 DIAGNOSIS — E43 Unspecified severe protein-calorie malnutrition: Secondary | ICD-10-CM | POA: Diagnosis present

## 2019-03-21 DIAGNOSIS — C787 Secondary malignant neoplasm of liver and intrahepatic bile duct: Secondary | ICD-10-CM | POA: Diagnosis not present

## 2019-03-21 DIAGNOSIS — R0602 Shortness of breath: Secondary | ICD-10-CM | POA: Diagnosis not present

## 2019-03-21 DIAGNOSIS — Z6826 Body mass index (BMI) 26.0-26.9, adult: Secondary | ICD-10-CM | POA: Diagnosis not present

## 2019-03-21 DIAGNOSIS — Z20828 Contact with and (suspected) exposure to other viral communicable diseases: Secondary | ICD-10-CM | POA: Diagnosis present

## 2019-03-21 DIAGNOSIS — R112 Nausea with vomiting, unspecified: Secondary | ICD-10-CM | POA: Diagnosis present

## 2019-03-21 DIAGNOSIS — Z923 Personal history of irradiation: Secondary | ICD-10-CM

## 2019-03-21 DIAGNOSIS — Z7901 Long term (current) use of anticoagulants: Secondary | ICD-10-CM

## 2019-03-21 DIAGNOSIS — L8990 Pressure ulcer of unspecified site, unspecified stage: Secondary | ICD-10-CM | POA: Diagnosis present

## 2019-03-21 DIAGNOSIS — D630 Anemia in neoplastic disease: Secondary | ICD-10-CM | POA: Diagnosis present

## 2019-03-21 DIAGNOSIS — D849 Immunodeficiency, unspecified: Secondary | ICD-10-CM | POA: Diagnosis present

## 2019-03-21 DIAGNOSIS — C3412 Malignant neoplasm of upper lobe, left bronchus or lung: Secondary | ICD-10-CM | POA: Diagnosis present

## 2019-03-21 DIAGNOSIS — C3490 Malignant neoplasm of unspecified part of unspecified bronchus or lung: Secondary | ICD-10-CM | POA: Diagnosis present

## 2019-03-21 DIAGNOSIS — R6520 Severe sepsis without septic shock: Secondary | ICD-10-CM

## 2019-03-21 DIAGNOSIS — Z66 Do not resuscitate: Secondary | ICD-10-CM | POA: Diagnosis not present

## 2019-03-21 DIAGNOSIS — Z993 Dependence on wheelchair: Secondary | ICD-10-CM

## 2019-03-21 DIAGNOSIS — R42 Dizziness and giddiness: Secondary | ICD-10-CM | POA: Diagnosis not present

## 2019-03-21 DIAGNOSIS — Z7401 Bed confinement status: Secondary | ICD-10-CM

## 2019-03-21 DIAGNOSIS — C7931 Secondary malignant neoplasm of brain: Secondary | ICD-10-CM | POA: Diagnosis not present

## 2019-03-21 DIAGNOSIS — Z79899 Other long term (current) drug therapy: Secondary | ICD-10-CM

## 2019-03-21 DIAGNOSIS — Z7189 Other specified counseling: Secondary | ICD-10-CM | POA: Diagnosis not present

## 2019-03-21 DIAGNOSIS — J189 Pneumonia, unspecified organism: Secondary | ICD-10-CM | POA: Diagnosis not present

## 2019-03-21 DIAGNOSIS — D6959 Other secondary thrombocytopenia: Secondary | ICD-10-CM | POA: Diagnosis present

## 2019-03-21 DIAGNOSIS — I10 Essential (primary) hypertension: Secondary | ICD-10-CM | POA: Diagnosis present

## 2019-03-21 DIAGNOSIS — E039 Hypothyroidism, unspecified: Secondary | ICD-10-CM | POA: Diagnosis present

## 2019-03-21 DIAGNOSIS — R319 Hematuria, unspecified: Secondary | ICD-10-CM | POA: Diagnosis not present

## 2019-03-21 DIAGNOSIS — Z800 Family history of malignant neoplasm of digestive organs: Secondary | ICD-10-CM

## 2019-03-21 DIAGNOSIS — R918 Other nonspecific abnormal finding of lung field: Secondary | ICD-10-CM | POA: Diagnosis not present

## 2019-03-21 DIAGNOSIS — R402411 Glasgow coma scale score 13-15, in the field [EMT or ambulance]: Secondary | ICD-10-CM | POA: Diagnosis not present

## 2019-03-21 DIAGNOSIS — R110 Nausea: Secondary | ICD-10-CM | POA: Diagnosis not present

## 2019-03-21 DIAGNOSIS — Z8249 Family history of ischemic heart disease and other diseases of the circulatory system: Secondary | ICD-10-CM

## 2019-03-21 DIAGNOSIS — Z515 Encounter for palliative care: Secondary | ICD-10-CM | POA: Diagnosis not present

## 2019-03-21 DIAGNOSIS — I959 Hypotension, unspecified: Secondary | ICD-10-CM | POA: Diagnosis not present

## 2019-03-21 DIAGNOSIS — Z880 Allergy status to penicillin: Secondary | ICD-10-CM

## 2019-03-21 DIAGNOSIS — M2550 Pain in unspecified joint: Secondary | ICD-10-CM | POA: Diagnosis not present

## 2019-03-21 DIAGNOSIS — R627 Adult failure to thrive: Secondary | ICD-10-CM | POA: Diagnosis present

## 2019-03-21 DIAGNOSIS — D539 Nutritional anemia, unspecified: Secondary | ICD-10-CM | POA: Diagnosis present

## 2019-03-21 DIAGNOSIS — Z79890 Hormone replacement therapy (postmenopausal): Secondary | ICD-10-CM

## 2019-03-21 DIAGNOSIS — Z90710 Acquired absence of both cervix and uterus: Secondary | ICD-10-CM

## 2019-03-21 DIAGNOSIS — Z803 Family history of malignant neoplasm of breast: Secondary | ICD-10-CM

## 2019-03-21 LAB — COMPREHENSIVE METABOLIC PANEL
ALT: 13 U/L (ref 0–44)
AST: 30 U/L (ref 15–41)
Albumin: 3 g/dL — ABNORMAL LOW (ref 3.5–5.0)
Alkaline Phosphatase: 153 U/L — ABNORMAL HIGH (ref 38–126)
Anion gap: 16 — ABNORMAL HIGH (ref 5–15)
BUN: 28 mg/dL — ABNORMAL HIGH (ref 8–23)
CO2: 24 mmol/L (ref 22–32)
Calcium: 9.1 mg/dL (ref 8.9–10.3)
Chloride: 98 mmol/L (ref 98–111)
Creatinine, Ser: 0.89 mg/dL (ref 0.44–1.00)
GFR calc Af Amer: >60 mL/min (ref >60)
GFR calc non Af Amer: >60 mL/min (ref >60)
Glucose, Bld: 99 mg/dL (ref 70–99)
Potassium: 3.2 mmol/L — ABNORMAL LOW (ref 3.5–5.1)
Sodium: 138 mmol/L (ref 135–145)
Total Bilirubin: 1.5 mg/dL — ABNORMAL HIGH (ref 0.3–1.2)
Total Protein: 6.9 g/dL (ref 6.5–8.1)

## 2019-03-21 LAB — CBC WITH DIFFERENTIAL/PLATELET
Abs Immature Granulocytes: 0.02 10*3/uL (ref 0.00–0.07)
Basophils Absolute: 0 10*3/uL (ref 0.0–0.1)
Basophils Relative: 0 %
Eosinophils Absolute: 0.1 10*3/uL (ref 0.0–0.5)
Eosinophils Relative: 1 %
HCT: 36.9 % (ref 36.0–46.0)
Hemoglobin: 11.7 g/dL — ABNORMAL LOW (ref 12.0–15.0)
Immature Granulocytes: 0 %
Lymphocytes Relative: 23 %
Lymphs Abs: 1.5 10*3/uL (ref 0.7–4.0)
MCH: 29.5 pg (ref 26.0–34.0)
MCHC: 31.7 g/dL (ref 30.0–36.0)
MCV: 93.2 fL (ref 80.0–100.0)
Monocytes Absolute: 0.6 10*3/uL (ref 0.1–1.0)
Monocytes Relative: 9 %
Neutro Abs: 4.6 10*3/uL (ref 1.7–7.7)
Neutrophils Relative %: 67 %
Platelets: 186 10*3/uL (ref 150–400)
RBC: 3.96 MIL/uL (ref 3.87–5.11)
RDW: 16 % — ABNORMAL HIGH (ref 11.5–15.5)
WBC: 6.8 10*3/uL (ref 4.0–10.5)
nRBC: 0 % (ref 0.0–0.2)

## 2019-03-21 LAB — PHOSPHORUS: Phosphorus: 2.8 mg/dL (ref 2.5–4.6)

## 2019-03-21 LAB — LACTIC ACID, PLASMA
Lactic Acid, Venous: 2.8 mmol/L (ref 0.5–1.9)
Lactic Acid, Venous: 1.8 mmol/L (ref 0.5–1.9)

## 2019-03-21 LAB — MAGNESIUM: Magnesium: 1.7 mg/dL (ref 1.7–2.4)

## 2019-03-21 LAB — LIPASE, BLOOD: Lipase: 28 U/L (ref 11–51)

## 2019-03-21 LAB — PROTIME-INR
INR: 1.7 — ABNORMAL HIGH (ref 0.8–1.2)
Prothrombin Time: 19.5 seconds — ABNORMAL HIGH (ref 11.4–15.2)

## 2019-03-21 LAB — CK: Total CK: 27 U/L — ABNORMAL LOW (ref 38–234)

## 2019-03-21 LAB — PROCALCITONIN: Procalcitonin: <0.1 ng/mL

## 2019-03-21 LAB — I-STAT CREATININE, ED: Creatinine, Ser: 0.7 mg/dL (ref 0.44–1.00)

## 2019-03-21 LAB — APTT: aPTT: 32 seconds (ref 24–36)

## 2019-03-21 MED ORDER — SODIUM CHLORIDE 0.9 % IV SOLN
INTRAVENOUS | Status: DC
  Administered 2019-03-22 (×2): via INTRAVENOUS

## 2019-03-21 MED ORDER — ONDANSETRON HCL 4 MG/2ML IJ SOLN
4.0000 mg | Freq: Four times a day (QID) | INTRAMUSCULAR | Status: DC | PRN
  Administered 2019-03-26 – 2019-03-27 (×2): 4 mg via INTRAVENOUS
  Filled 2019-03-21 (×2): qty 2

## 2019-03-21 MED ORDER — DEXAMETHASONE SODIUM PHOSPHATE 4 MG/ML IJ SOLN
4.0000 mg | Freq: Once | INTRAMUSCULAR | Status: AC
  Administered 2019-03-21: 4 mg via INTRAVENOUS
  Filled 2019-03-21: qty 1

## 2019-03-21 MED ORDER — APIXABAN 5 MG PO TABS
5.0000 mg | ORAL_TABLET | Freq: Two times a day (BID) | ORAL | Status: DC
  Administered 2019-03-21 – 2019-03-27 (×11): 5 mg via ORAL
  Filled 2019-03-21 (×11): qty 1

## 2019-03-21 MED ORDER — LEVOTHYROXINE SODIUM 25 MCG PO TABS
25.0000 ug | ORAL_TABLET | Freq: Every day | ORAL | Status: DC
  Administered 2019-03-22 – 2019-03-24 (×3): 25 ug via ORAL
  Filled 2019-03-21 (×3): qty 1

## 2019-03-21 MED ORDER — SENNA 8.6 MG PO TABS
1.0000 | ORAL_TABLET | Freq: Two times a day (BID) | ORAL | Status: DC
  Administered 2019-03-21 – 2019-03-29 (×8): 8.6 mg via ORAL
  Filled 2019-03-21 (×13): qty 1

## 2019-03-21 MED ORDER — SODIUM CHLORIDE 0.9 % IV SOLN
INTRAVENOUS | Status: AC
  Administered 2019-03-21: 22:00:00 via INTRAVENOUS

## 2019-03-21 MED ORDER — CHLORHEXIDINE GLUCONATE CLOTH 2 % EX PADS
6.0000 | MEDICATED_PAD | Freq: Every day | CUTANEOUS | Status: DC
  Administered 2019-03-21 – 2019-03-29 (×7): 6 via TOPICAL

## 2019-03-21 MED ORDER — ONDANSETRON HCL 4 MG/2ML IJ SOLN
4.0000 mg | Freq: Once | INTRAMUSCULAR | Status: AC
  Administered 2019-03-21: 4 mg via INTRAVENOUS
  Filled 2019-03-21: qty 2

## 2019-03-21 MED ORDER — GADOBUTROL 1 MMOL/ML IV SOLN
7.0000 mL | Freq: Once | INTRAVENOUS | Status: AC | PRN
  Administered 2019-03-21: 7 mL via INTRAVENOUS

## 2019-03-21 MED ORDER — ACETAMINOPHEN 650 MG RE SUPP: 650.0000 mg | Freq: Four times a day (QID) | RECTAL | Status: DC | PRN

## 2019-03-21 MED ORDER — GABAPENTIN 100 MG PO CAPS
100.0000 mg | ORAL_CAPSULE | Freq: Three times a day (TID) | ORAL | Status: DC
  Administered 2019-03-21 – 2019-03-29 (×23): 100 mg via ORAL
  Filled 2019-03-21 (×23): qty 1

## 2019-03-21 MED ORDER — METOCLOPRAMIDE HCL 5 MG/ML IJ SOLN: 5.0000 mg | Freq: Four times a day (QID) | INTRAMUSCULAR | Status: DC | PRN

## 2019-03-21 MED ORDER — ACETAMINOPHEN 325 MG PO TABS: 650.0000 mg | ORAL_TABLET | Freq: Four times a day (QID) | ORAL | Status: DC | PRN

## 2019-03-21 MED ORDER — DEXAMETHASONE SODIUM PHOSPHATE 4 MG/ML IJ SOLN
4.0000 mg | Freq: Two times a day (BID) | INTRAMUSCULAR | Status: DC
  Administered 2019-03-22 – 2019-03-24 (×5): 4 mg via INTRAVENOUS
  Filled 2019-03-21 (×6): qty 1

## 2019-03-21 MED ORDER — HYDROCODONE-ACETAMINOPHEN 5-325 MG PO TABS: 1.0000 | ORAL_TABLET | ORAL | Status: DC | PRN

## 2019-03-21 MED ORDER — SODIUM CHLORIDE 0.9 % IV BOLUS: 500.0000 mL | Freq: Once | INTRAVENOUS | Status: DC

## 2019-03-21 MED ORDER — POTASSIUM CHLORIDE 10 MEQ/100ML IV SOLN
10.0000 meq | INTRAVENOUS | Status: AC
  Administered 2019-03-21 – 2019-03-22 (×4): 10 meq via INTRAVENOUS
  Filled 2019-03-21 (×2): qty 100

## 2019-03-21 MED ORDER — SODIUM CHLORIDE 0.9 % IV BOLUS
1000.0000 mL | Freq: Once | INTRAVENOUS | Status: AC
  Administered 2019-03-21: 1000 mL via INTRAVENOUS

## 2019-03-21 MED ORDER — LORAZEPAM 2 MG/ML IJ SOLN
1.0000 mg | Freq: Once | INTRAMUSCULAR | Status: AC
  Administered 2019-03-29: 1 mg via INTRAVENOUS
  Filled 2019-03-21: qty 1

## 2019-03-21 MED ORDER — LEVOFLOXACIN IN D5W 750 MG/150ML IV SOLN
750.0000 mg | INTRAVENOUS | Status: DC
  Administered 2019-03-22 – 2019-03-23 (×2): 750 mg via INTRAVENOUS
  Filled 2019-03-21 (×2): qty 150

## 2019-03-21 MED ORDER — ONDANSETRON HCL 4 MG PO TABS
4.0000 mg | ORAL_TABLET | Freq: Four times a day (QID) | ORAL | Status: DC | PRN
  Administered 2019-03-21 – 2019-03-25 (×2): 4 mg via ORAL
  Filled 2019-03-21 (×2): qty 1

## 2019-03-21 MED ORDER — ORAL CARE MOUTH RINSE
15.0000 mL | Freq: Two times a day (BID) | OROMUCOSAL | Status: DC
  Administered 2019-03-21 – 2019-03-29 (×16): 15 mL via OROMUCOSAL

## 2019-03-21 MED ORDER — LEVOFLOXACIN IN D5W 750 MG/150ML IV SOLN
750.0000 mg | Freq: Once | INTRAVENOUS | Status: AC
  Administered 2019-03-21: 750 mg via INTRAVENOUS
  Filled 2019-03-21: qty 150

## 2019-03-21 MED ORDER — DEXAMETHASONE 4 MG PO TABS
4.0000 mg | ORAL_TABLET | Freq: Once | ORAL | Status: AC
  Administered 2019-03-21: 4 mg via ORAL
  Filled 2019-03-21: qty 1

## 2019-03-21 NOTE — ED Notes (Signed)
Attempted to call report x 1. Receiving RN is giving patient care. Will continue to monitor.

## 2019-03-21 NOTE — ED Notes (Signed)
Patient to MRI.

## 2019-03-21 NOTE — ED Notes (Signed)
Pt had large episode of emesis promptly after taking oral decadron. MD notifed

## 2019-03-21 NOTE — ED Notes (Signed)
EKG given to Hospitalist,[place],MD., for review.

## 2019-03-21 NOTE — Progress Notes (Signed)
Pharmacy Antibiotic Note  Debra Hodges is a 68 y.o. female admitted on 03/21/2019 with pneumonia.  Pharmacy has been consulted for levofloxacin dosing.  Pt has PCN allergy, no documented history of cephalosporin use.   Today, 03/21/19  WBC 6.8 - WNL  SCr 0.7, CrCl 69 mL/min  Lactate 2.8  Afebrile  Plan:  Levofloxacin 750 mg IV q24h  Height: 5\' 6"  (167.6 cm) Weight: 165 lb (74.8 kg) IBW/kg (Calculated) : 59.3  Temp (24hrs), Avg:98.4 F (36.9 C), Min:98.4 F (36.9 C), Max:98.4 F (36.9 C)  Recent Labs  Lab 03/21/19 1359 03/21/19 1400 03/21/19 1406  WBC 6.8  --   --   CREATININE 0.89  --  0.70  LATICACIDVEN  --  2.8*  --     Estimated Creatinine Clearance: 69.6 mL/min (by C-G formula based on SCr of 0.7 mg/dL).    Allergies  Allergen Reactions  . Peanut-Containing Drug Products     Excessive mucous  . Penicillins     Hives, Childhood Allergy Has patient had a PCN reaction causing immediate rash, facial/tongue/throat swelling, SOB or lightheadedness with hypotension: Yes Has patient had a PCN reaction causing severe rash involving mucus membranes or skin necrosis: No Has patient had a PCN reaction that required hospitalization: No Has patient had a PCN reaction occurring within the last 10 years: No If all of the above answers are "NO", then may proceed with Cephalosporin use.     Antimicrobials this admission: levofloxacin 10/23 >>   Dose adjustments this admission:  Microbiology results: 10/23 BCx: Sent 10/23 Sputum: Sent  10/23 MRSA PCR: Sent  Thank you for allowing pharmacy to be a part of this patient's care.  Lenis Noon, PharmD 03/21/2019 7:29 PM

## 2019-03-21 NOTE — H&P (Signed)
Debra Hodges TDS:[id number] DOB: 02/08/1951 DOA: 03/21/2019     PCP: Patient, No Pcp Per   Outpatient Specialists:      Oncology   Dr. Julien Nordmann   Patient arrived to ER on 03/21/19 at 1312  Patient coming from: home Lives   With family    Chief Complaint:   Chief Complaint  Patient presents with   Tremors    HPI: Debra Hodges is a 68 y.o. female with medical history significant of metastatic non-small cell lung cancer, metastatic to liver, brain on oral chemotherapy, hypertension, DVT since 2019, on Eliquis  bed bound  Presented with   Tremors and right eye droopiness, increased nausea and vomiting Tagrisso, her last dose was 4 days ago. Reports tremor has been going on for past few weeks but has progressively getting worse.  Usually involving her right leg and right arm Last time patient was admitted in July for dehydration in the setting of nausea and vomiting  Reports for the past week her right leg have been weeker she is unable to walk . Her oncologist recommended stopping her Chemo due to ongoing nausea and vomiting  She continues to take Eliquis  denies any recent bleeding  Infectious risk factors:  Reports dry cough, In  ER RAPID COVID TEST  in house testing  Pending  Lab Results  Component Value Date   [place] 12/17/2018   [place] NEGATIVE 12/11/2018   [place] NEGATIVE 11/17/2018     Regarding pertinent Chronic problems:    Non-small cell lung cancer stage IV diagnosed since 2015 this post whole brain XRT  Hx of DVT on Eliquis  Hypertension takes spironolactone   Hypothyroidism:  Lab Results  Component Value Date   TSH <0.010 (L) 12/12/2018   on synthroid     While in ER: Noted to have elevated lactic acid up to 2.8 Hypokalemia of potassium down to 3.2 The following Work up has been ordered so far: Worrisome for left lower lobe pneumonia MRI showing progression of her brain metastases Plan for patient to get  Decadron She received first dose in emergency department and had vomiting Orders Placed This Encounter  Procedures   SARS CORONAVIRUS 2 (TAT 6-24 HRS) Nasopharyngeal Nasopharyngeal Swab   DG Chest Portable 1 View   MR Brain W and Wo Contrast   Comprehensive metabolic panel   Lipase, blood   CBC WITH DIFFERENTIAL   Urinalysis, Routine w reflex microscopic   Lactic acid, plasma   Diet NPO time specified Except for: Sips with Meds   Initiate Carrier Fluid Protocol   Consult to oncology  ALL PATIENTS BEING ADMITTED/HAVING PROCEDURES NEED COVID-19 SCREENING   Consult to hospitalist  ALL PATIENTS BEING ADMITTED/HAVING PROCEDURES NEED COVID-19 SCREENING   I-Stat Creatinine, ED (not at [facility])   Insert peripheral IV     Following Medications were ordered in ER: Medications  sodium chloride 0.9 % bolus 1,000 mL (0 mLs Intravenous Stopped 03/21/19 1655)    And  0.9 %  sodium chloride infusion (has no administration in time range)  LORazepam (ATIVAN) injection 1 mg (1 mg Intravenous Not Given 03/21/19 1650)  ondansetron (ZOFRAN) injection 4 mg (4 mg Intravenous Given 03/21/19 1352)  gadobutrol (GADAVIST) 1 MMOL/ML injection 7 mL (7 mLs Intravenous Contrast Given 03/21/19 1449)  levofloxacin (LEVAQUIN) IVPB 750 mg (750 mg Intravenous New Bag/Given 03/21/19 1652)  dexamethasone (DECADRON) tablet 4 mg (4 mg Oral Given 03/21/19 1819)  dexamethasone (DECADRON) injection 4 mg (4 mg Intravenous Given 03/21/19  1834)  ondansetron (ZOFRAN) injection 4 mg (4 mg Intravenous Given 03/21/19 1835)        Consult Orders  (From admission, onward)         Start     Ordered   03/21/19 1807  Consult to hospitalist  ALL PATIENTS BEING ADMITTED/HAVING PROCEDURES NEED COVID-19 SCREENING  Once    Comments: ALL PATIENTS BEING ADMITTED/HAVING PROCEDURES NEED COVID-19 SCREENING  Provider:  (Not yet assigned)  Question Answer Comment  Place call to: [facility]   Reason for Consult Admit       03/21/19 1806          ER Provider Called:    Oncology  Dr. Ron Agee They Recommend admit to medicine initiate Decadron Will see in AM    Significant initial  Findings: Abnormal Labs Reviewed  COMPREHENSIVE METABOLIC PANEL - Abnormal; Notable for the following components:      Result Value   Potassium 3.2 (*)    BUN 28 (*)    Albumin 3.0 (*)    Alkaline Phosphatase 153 (*)    Total Bilirubin 1.5 (*)    Anion gap 16 (*)    All other components within normal limits  CBC WITH DIFFERENTIAL/PLATELET - Abnormal; Notable for the following components:   Hemoglobin 11.7 (*)    RDW 16.0 (*)    All other components within normal limits  LACTIC ACID, PLASMA - Abnormal; Notable for the following components:   Lactic Acid, Venous 2.8 (*)    All other components within normal limits    Otherwise labs showing:    Recent Labs  Lab 03/21/19 1359 03/21/19 1406  NA 138  --   K 3.2*  --   CO2 24  --   GLUCOSE 99  --   BUN 28*  --   CREATININE 0.89 0.70  CALCIUM 9.1  --     Cr     stable,    Lab Results  Component Value Date   CREATININE 0.70 03/21/2019   CREATININE 0.89 03/21/2019   CREATININE 0.50 12/16/2018    Recent Labs  Lab 03/21/19 1359  AST 30  ALT 13  ALKPHOS 153*  BILITOT 1.5*  PROT 6.9  ALBUMIN 3.0*   Lab Results  Component Value Date   CALCIUM 9.1 03/21/2019   PHOS 2.6 12/12/2018      WBC     Component Value Date/Time   WBC 6.8 03/21/2019 1359   ANC    Component Value Date/Time   NEUTROABS 4.6 03/21/2019 1359   NEUTROABS 2.5 04/30/2017 1541   ALC No components found for: LYMPHAB    Plt: Lab Results  Component Value Date   PLT 186 03/21/2019     Lactic Acid, Venous    Component Value Date/Time   LATICACIDVEN 2.8 (HH) 03/21/2019 1400       COVID-19 Labs  No results for input(s): DDIMER, FERRITIN, LDH, CRP in the last 72 hours.  Lab Results  Component Value Date   SARSCOV2NAA NEGATIVE 12/17/2018   SARSCOV2NAA NEGATIVE  12/11/2018   [place] NEGATIVE 11/17/2018     HG/HCT   Stable,     Component Value Date/Time   HGB 11.7 (L) 03/21/2019 1359   HGB 12.5 10/29/2018 1001   HGB 12.7 04/30/2017 1541   HCT 36.9 03/21/2019 1359   HCT 38.4 04/30/2017 1541    Recent Labs  Lab 03/21/19 1359  LIPASE 28   No results for input(s): AMMONIA in the last 168 hours.  No components  found for: LABALBU    ECG: Ordered Personally reviewed by me showing: HR : 115 Rhythm:  Sinus tachycardia Ischemic changes no evidence of ischemic changes QTC 489      UA ordered       MR Brain- New metastatic deposit in the posterior splenium corpus callosum. Progression of metastatic disease right posterior parietal lobe.  CXR -opacity consistent with pneumonia left base      ED Triage Vitals  Enc Vitals Group     BP 03/21/19 1330 (!) 159/137     Pulse Rate 03/21/19 1330 (!) 130     Resp 03/21/19 1334 (!) 23     Temp 03/21/19 1334 98.4 F (36.9 C)     Temp Source 03/21/19 1334 Oral     SpO2 03/21/19 1326 95 %     Weight 03/21/19 1354 165 lb (74.8 kg)     Height 03/21/19 1354 _0  (1.676 m)     Head Circumference --      Peak Flow --      Pain Score 03/21/19 1336 1     Pain Loc --      Pain Edu? --      Excl. in [place]? --   TMAX(24)@       Latest  Blood pressure 127/70, pulse (!) 107, temperature 98.4 F (36.9 C), temperature source Oral, resp. rate 14, height _1  (1.676 m), weight 74.8 kg, SpO2 99 %.     Hospitalist was called for admission for LLL PNA and symptomatic brain metastasis   Review of Systems:    Pertinent positives include:  Fatigue, tremor,   Constitutional:  No weight loss, night sweats, Fevers, chills, weight loss  HEENT:  No headaches, Difficulty swallowing,Tooth/dental problems,Sore throat,  No sneezing, itching, ear ache, nasal congestion, post nasal drip,  Cardio-vascular:  No chest pain, Orthopnea, PND, anasarca, dizziness, palpitations.no Bilateral lower extremity swelling   GI:  No heartburn, indigestion, abdominal pain, nausea, vomiting, diarrhea, change in bowel habits, loss of appetite, melena, blood in stool, hematemesis Resp:  no shortness of breath at rest. No dyspnea on exertion, No excess mucus, no productive cough, No non-productive cough, No coughing up of blood.No change in color of mucus.No wheezing. Skin:  no rash or lesions. No jaundice GU:  no dysuria, change in color of urine, no urgency or frequency. No straining to urinate.  No flank pain.  Musculoskeletal:  No joint pain or no joint swelling. No decreased range of motion. No back pain.  Psych:  No change in mood or affect. No depression or anxiety. No memory loss.  Neuro: no localizing neurological complaints, no tingling, no weakness, no double vision, no gait abnormality, no slurred speech, no confusion  All systems reviewed and apart from [place] all are negative  Past Medical History:   Past Medical History:  Diagnosis Date   High blood pressure    Liver lesion 06/26/2016   Non-small cell carcinoma of lung, stage 4 ([place]) dx'd 10/2013      Past Surgical History:  Procedure Laterality Date   ABDOMINAL HYSTERECTOMY     BREAST BIOPSY     Right breast   VIDEO BRONCHOSCOPY Bilateral 10/27/2013   Procedure: VIDEO BRONCHOSCOPY WITH FLUORO;  Surgeon: Collene Gobble, MD;  Location: [facility];  Service: Cardiopulmonary;  Laterality: Bilateral;    Social History:  Ambulatory   wheelchair bound      reports that she has never smoked. She has never used smokeless tobacco. She reports that  she does not drink alcohol or use drugs.     Family History:   Family History  Problem Relation Age of Onset   High blood pressure Mother    Cancer Mother        skin   Cancer Father        esophageal   Cancer Maternal Grandfather        breast    Allergies: Allergies  Allergen Reactions   Peanut-Containing Drug Products     Excessive mucous   Penicillins     Hives,  Childhood Allergy Has patient had a PCN reaction causing immediate rash, facial/tongue/throat swelling, SOB or lightheadedness with hypotension: Yes Has patient had a PCN reaction causing severe rash involving mucus membranes or skin necrosis: No Has patient had a PCN reaction that required hospitalization: No Has patient had a PCN reaction occurring within the last 10 years: No If all of the above answers are "NO", then may proceed with Cephalosporin use.      Prior to Admission medications   Medication Sig Start Date End Date Taking? Authorizing Provider  acetaminophen (TYLENOL) 325 MG tablet Take 650 mg by mouth every 6 (six) hours as needed for mild pain. Reported on 06/15/2015   Yes [provider]  calcium carbonate (OS-CAL) 600 MG TABS tablet Take 600 mg by mouth daily.    Yes [provider]  ELIQUIS 5 MG TABS tablet TAKE 1 TABLET(5 MG) BY MOUTH TWICE DAILY Patient taking differently: Take 5 mg by mouth 2 (two) times daily.  10/16/18  Yes Curt Bears, MD  feeding supplement, ENSURE ENLIVE, (ENSURE ENLIVE) LIQD Take 237 mLs by mouth 2 (two) times daily between meals. 12/16/18  Yes Hosie Poisson, MD  gabapentin (NEURONTIN) 100 MG capsule TAKE 3 CAPSULES(300 MG) BY MOUTH THREE TIMES DAILY Patient taking differently: Take 100 mg by mouth 3 (three) times daily.  11/01/18  Yes Curt Bears, MD  levothyroxine (SYNTHROID) 25 MCG tablet Take 1 tablet (25 mcg total) by mouth at bedtime. Patient taking differently: Take 25 mcg by mouth daily before breakfast.  12/16/18  Yes Hosie Poisson, MD  loperamide (IMODIUM) 2 MG capsule Take 2 mg by mouth daily as needed for diarrhea or loose stools. Reported on 11/22/2015   Yes [provider]  osimertinib mesylate (TAGRISSO) 80 MG tablet Take 2 tablets (160 mg total) by mouth daily. 10/04/18  Yes Curt Bears, MD  potassium chloride SA (K-DUR,KLOR-CON) 20 MEQ tablet Take 1 tablet (20 mEq total) by mouth at bedtime. 09/11/18   Yes Heilingoetter, Cassandra L, PA-C  prochlorperazine (COMPAZINE) 10 MG tablet Take 10 mg by mouth every 6 (six) hours as needed for nausea or vomiting.   Yes [provider]  spironolactone (ALDACTONE) 25 MG tablet Take 25 mg by mouth daily.    Yes [provider]  vitamin C (ASCORBIC ACID) 500 MG tablet Take 500 mg by mouth daily.   Yes [provider]  Amino Acids-Protein Hydrolys (FEEDING SUPPLEMENT, PRO-STAT SUGAR FREE 64,) LIQD Take 30 mLs by mouth 2 (two) times daily. Patient not taking: Reported on 03/21/2019 12/16/18   Hosie Poisson, MD   Physical Exam: Blood pressure 127/70, pulse (!) 107, temperature 98.4 F (36.9 C), temperature source Oral, resp. rate 14, height _0  (1.676 m), weight 74.8 kg, SpO2 99 %. 1. General:  in No Acute distress    Chronically ill  -appearing 2. Psychological: Alert and  Oriented 3. Head/ENT:    Dry Mucous Membranes  Head Non traumatic, neck supple                        Poor Dentition 4. SKIN:  decreased Skin turgor,  Skin clean Dry and intact no rash 5. Heart: Regular rate and rhythm no Murmur, no Rub or gallop 6. Lungs:  no wheezes or crackles   7. Abdomen: Soft,  non-tender, Non distended  bowel sounds present 8. Lower extremities: no clubbing, cyanosis, no  edema 9. Neurologically  strength diminished on the right side, right eye with proptosis 10. MSK: Normal range of motion   All other LABS:     Recent Labs  Lab 03/21/19 1359  WBC 6.8  NEUTROABS 4.6  HGB 11.7*  HCT 36.9  MCV 93.2  PLT 186     Recent Labs  Lab 03/21/19 1359 03/21/19 1406  NA 138  --   K 3.2*  --   CL 98  --   CO2 24  --   GLUCOSE 99  --   BUN 28*  --   CREATININE 0.89 0.70  CALCIUM 9.1  --      Recent Labs  Lab 03/21/19 1359  AST 30  ALT 13  ALKPHOS 153*  BILITOT 1.5*  PROT 6.9  ALBUMIN 3.0*      Cultures:    Component Value Date/Time   SDES  12/12/2018 0414    URINE, RANDOM Performed at  [facility], [place] [address], [place], [place] [address]    SPECREQUEST  12/12/2018 0414    NONE Performed at [facility], [place] [address], [place], [place] [address]    CULT  12/12/2018 [phone]    Multiple bacterial morphotypes present, none predominant. Suggest appropriate recollection if clinically indicated.   REPTSTATUS 12/14/2018 FINAL 12/12/2018 0414     Radiological Exams on Admission: Mr Brain W And Wo Contrast  Result Date: 03/21/2019 CLINICAL DATA:  Metastatic lung cancer.  Post whole-brain radiation. EXAM: MRI HEAD WITHOUT AND WITH CONTRAST TECHNIQUE: Multiplanar, multiecho pulse sequences of the brain and surrounding structures were obtained without and with intravenous contrast. CONTRAST:  35m GADAVIST GADOBUTROL 1 MMOL/ML IV SOLN COMPARISON:  MRI head 11/18/2018 FINDINGS: Brain: Known metastatic disease to the brain. 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 Leptomeningeal enhancement right occipital lobe stable, axial image 66 5 mm enhancing nodule posterior splenium of corpus callosum is new 5 x 8 mm enhancing nodule right posterior parietal lobe has progressed in the interval. Axial image 97 Punctate enhancement right posterior frontal lobe axial image 113 unchanged. Punctate calcification left medial frontal lobe unchanged axial image 112 Leptomeningeal enhancement right frontal convexity unchanged. Generalized atrophy. Mild ventricular enlargement. Mild white matter changes have progressed since the prior study. Progressive FLAIR hyperintensity right temporal lobe without restricted diffusion or enhancement. Possible radiation change. Negative for acute infarct Vascular: Normal arterial flow voids Skull and upper cervical spine: No focal skeletal lesions. Sinuses/Orbits: Mild mucosal edema paranasal sinuses.  Normal orbit Other: None IMPRESSION: New metastatic  deposit in the posterior splenium corpus callosum. Progression of metastatic disease right posterior parietal lobe. Multiple additional treated lesions are stable in the brain. Progressive white matter changes. Progressive FLAIR hyperintensity in the right temporal lobe. These findings are likely due to treatment. Electronically Signed   By: CFranchot GalloM.D.   On: 03/21/2019 15:54   Dg Chest Portable 1 View  Result Date: 03/21/2019  CLINICAL DATA:  Weakness and tremor. History of stage IV lung carcinoma EXAM: PORTABLE CHEST 1 VIEW COMPARISON:  September 20, 2018 chest radiograph and chest CT December 12, 2018 FINDINGS: There is volume loss and fibrosis in the left upper lobe, likely due to previous radiation therapy change. This appearance is stable. There is airspace opacity in the left base, concerning for pneumonia. Right lung is clear. Heart size is normal. Pulmonary vascular on the right is normal. There is distortion of pulmonary vascularity on the left due to the apparent post radiation therapy change. No adenopathy is appreciable by radiography. No bone lesions. IMPRESSION: 1.Apparent scarring and fibrosis in the left upper lobe medially with changes felt to be secondary to post radiation therapy. 2.  Airspace opacity consistent with pneumonia left base. 3.  Right lung clear. 4. Stable cardiac silhouette. No adenopathy appreciable by radiography. Electronically Signed   By: Lowella Grip III M.D.   On: 03/21/2019 15:57    Chart has been reviewed    Assessment/Plan  69 y.o. female with medical history significant of metastatic non-small cell lung cancer, metastatic to liver, brain on oral chemotherapy, hypertension, DVT since 2019, on Eliquis  bed bound  Admitted for pneumonia and worsening metastasis to the brain with neurological manifestations and intractable nausea and vomiting resulting  in dehydration  Present on Admission:  Brain metastasis ([place]) initiate Decadron 4 mg daily goal  patient able to tolerate p.o. switch to iV appreciate oncology input)  Non-small cell carcinoma of lung, stage 4 ([place]) -  oncology is aware  Metastases to the liver ([place]) monitor LFT's   Sepsis ([place]) -  -SIRS criteria met with tachycardia, elevated LA   With evidence of end organ damage such as  elevated lactic acid    -Most likely source being: pulmonary   - Obtain serial lactic acid and procalcitonin level.  - Initiate IV antibiotics   - await results of blood and urine culture  - Rehydrate     Hypothyroidism - - Check TSH continue home medications at current dose     intractable Nausea and vomiting as noted in dehydration secondary to brain metastases   Dehydration -hydrate and follow fluid status    Hypokalemia - will replace and repeat in AM,  check magnesium level and replace as needed  Poor Nutritional status check prealbumin in obtain nutrition consult  Hx of DVT - continue Eliquis, pt is at high risk for clotting   Other plan as per orders.  DVT prophylaxis:  Eliquis  Code Status:   DNR/DNI   as per patient   I had personally discussed CODE STATUS with patient   I had spent 15 min discussing goals of care and CODE STATUS wit the patietn  Family Communication:   Family not at  Bedside    Disposition Plan:      To home once workup is complete and patient is stable                      Would benefit from PT/OT eval prior to DC  Ordered                   Swallow eval - SLP ordered                                     Nutrition    consulted  Consults called: oncology aware   Admission status:  ED Disposition    None         inpatient     Expect 2 midnight stay secondary to severity of patient's current illness including   hemodynamic instability despite optimal treatment (tachycardia )  Severe lab/radiological/exam abnormalities including: Brain metastasis, left lower lobe pneumonia   and extensive comorbidities  including:    Malignancy,  That are currently affecting medical management.   I expect  patient to be hospitalized for 2 midnights requiring inpatient medical care.  Patient is at high risk for adverse outcome (such as loss of life or disability) if not treated.  Indication for inpatient stay as follows:  Severe change from baseline regarding mental status Hemodynamic instability despite maximal medical therapy,    inability to maintain oral hydration due to  intractable nausea and vomiting    Need for IV antibiotics, IV fluids,      Level of care   STEPDOWN  please discontinue once patient no longer qualifies  Precautions:    No active isolations  PPE: Used by the provider:   P100  eye Goggles,  Gloves  [place] 03/21/2019, 8:06 PM    [facility]     after 2 AM please page floor coverage PA If 7AM-7PM, please contact the day team taking care of the patient using [email or web address]

## 2019-03-21 NOTE — ED Notes (Signed)
CRITICAL VALUE STICKER  CRITICAL VALUE: Lactic 2.8  RECEIVER (on-site recipient of call): Carly RN  [place] NOTIFIED: 03/21/19  MESSENGER (representative from lab):   MD NOTIFIED: Vanita Panda  TIME OF NOTIFICATION: 1440  RESPONSE: Noted

## 2019-03-21 NOTE — ED Provider Notes (Signed)
[facility] DEPT Provider Note   CSN: [id number] Arrival date & time: 03/21/19  1312     History   Chief Complaint Chief Complaint  Patient presents with   Tremors    HPI Debra Hodges is a 68 y.o. female.     HPI Patient presents with concern of worsening tremor, new right eye involuntary motion. 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 Patient has been taking Tagrisso, her last dose was 4 days ago. Over the past month patient has had some tremor, but home health notes that today the patient had more tremor than usual, and her right leg, right arm, as well as new, apparent, right I involuntary motion, with closure over the lid. Patient still complains of nausea, states that she is about as weak as she typically is, denies overt confusion, denies focal pain. Other than her recent cessation of oral oncologic medication, no recent medicine, diet, activity changes. Patient is essentially bedbound.  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   Neutropenia due to and not concurrent with chemotherapy ([place]) 04/20/2018   Nausea and vomiting 04/18/2018   Neutropenia ([place]) 04/18/2018   Acute pulmonary embolism ([place]) 03/07/2018   Hypothyroidism 03/07/2018   Non-small cell lung cancer with metastasis ([place])    Goals of care, counseling/discussion 02/11/2018   Encounter for  antineoplastic immunotherapy 02/11/2018   Metastases to the liver ([place]) 01/24/2017   Liver lesion 06/26/2016   Non-small cell carcinoma of lung, stage 4 ([place])    Vaginal lesion 01/06/2015   Encounter for antineoplastic chemotherapy 01/06/2015   Malignant neoplasm of upper lobe of left lung ([place]) 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      Home Medications    Prior to Admission medications   Medication Sig Start Date End Date Taking? Authorizing Provider  acetaminophen (TYLENOL) 325 MG tablet Take 650 mg by mouth every 6 (six) hours as needed for mild pain. Reported on 06/15/2015   Yes [provider]  calcium carbonate (OS-CAL) 600 MG TABS tablet Take 600 mg by mouth daily.    Yes [provider]  ELIQUIS 5 MG TABS tablet TAKE 1 TABLET(5 MG) BY MOUTH TWICE DAILY Patient taking differently: Take 5 mg by mouth 2 (two) times daily.  10/16/18  Yes Curt Bears, MD  feeding supplement, ENSURE ENLIVE, (ENSURE ENLIVE) LIQD Take 237 mLs by mouth 2 (two) times daily between meals. 12/16/18  Yes Hosie Poisson, MD  gabapentin (NEURONTIN) 100 MG capsule TAKE 3 CAPSULES(300 MG) BY MOUTH THREE TIMES DAILY Patient  taking differently: Take 100 mg by mouth 3 (three) times daily.  11/01/18  Yes Curt Bears, MD  levothyroxine (SYNTHROID) 25 MCG tablet Take 1 tablet (25 mcg total) by mouth at bedtime. Patient taking differently: Take 25 mcg by mouth daily before breakfast.  12/16/18  Yes Hosie Poisson, MD  loperamide (IMODIUM) 2 MG capsule Take 2 mg by mouth daily as needed for diarrhea or loose stools. Reported on 11/22/2015    Yes [provider]  osimertinib mesylate (TAGRISSO) 80 MG tablet Take 2 tablets (160 mg total) by mouth daily. 10/04/18  Yes Curt Bears, MD  potassium chloride SA (K-DUR,KLOR-CON) 20 MEQ tablet Take 1 tablet (20 mEq total) by mouth at bedtime. 09/11/18  Yes Heilingoetter, Cassandra L, PA-C  prochlorperazine (COMPAZINE) 10 MG tablet Take 10 mg by mouth every 6 (six) hours as needed for nausea or vomiting.   Yes [provider]  spironolactone (ALDACTONE) 25 MG tablet Take 25 mg by mouth daily.    Yes [provider]  vitamin C (ASCORBIC ACID) 500 MG tablet Take 500 mg by mouth daily.   Yes [provider]  Amino Acids-Protein Hydrolys (FEEDING SUPPLEMENT, PRO-STAT SUGAR FREE 64,) LIQD Take 30 mLs by mouth 2 (two) times daily. Patient not taking: Reported on 03/21/2019 12/16/18   Hosie Poisson, MD    Family History Family History  Problem Relation Age of Onset   High blood pressure Mother    Cancer Mother        skin   Cancer Father        esophageal   Cancer Maternal Grandfather        breast    Social History Social History   Tobacco Use   Smoking status: Never Smoker   Smokeless tobacco: Never Used  Substance Use Topics   Alcohol use: No    Comment: social   Drug use: No     Allergies   Peanut-containing drug products and Penicillins   Review of Systems Review of Systems  Constitutional:       Per HPI, otherwise negative  HENT:       Per HPI, otherwise negative  Respiratory:       Per HPI, otherwise negative  Cardiovascular:       Per HPI, otherwise negative  Gastrointestinal: Positive for nausea and vomiting.  Endocrine:       Negative aside from HPI  Genitourinary:       Neg aside from HPI   Musculoskeletal:       Per HPI, otherwise negative  Skin: Negative.   Allergic/Immunologic: Positive for immunocompromised state.  Neurological: Positive for tremors and weakness. Negative for syncope.     Physical  Exam Updated Vital Signs BP 107/80    Pulse (!) 108    Temp 98.4 F (36.9 C) (Oral)    Resp (!) 23    Ht 5\' 6"  (1.676 m)    Wt 74.8 kg    SpO2 97%    BMI 26.63 kg/m   Physical Exam Vitals signs and nursing note reviewed.  Constitutional:      Appearance: She is well-developed. She is ill-appearing.     Comments: Very ill-appearing female.  HENT:     Head:     Comments: alopecia Eyes:     Conjunctiva/sclera: Conjunctivae normal.  Cardiovascular:     Rate and Rhythm: Normal rate and regular rhythm.  Pulmonary:     Effort: Pulmonary effort is normal. No respiratory distress.  Breath sounds: Normal breath sounds. No stridor.  Abdominal:     General: There is no distension.  Skin:    General: Skin is warm and dry.  Neurological:     Mental Status: She is alert and oriented to person, place, and time.     Cranial Nerves: No cranial nerve deficit.     Motor: Tremor and atrophy present.     Comments: Obvious tremor right upper, right lower extremities, diffuse atrophy.  Patient does follow commands, speaks clearly, briefly, has insight into her presentation.      ED Treatments / Results  Labs (all labs ordered are listed, but only abnormal results are displayed) Labs Reviewed  COMPREHENSIVE METABOLIC PANEL - Abnormal; Notable for the following components:      Result Value   Potassium 3.2 (*)    BUN 28 (*)    Albumin 3.0 (*)    Alkaline Phosphatase 153 (*)    Total Bilirubin 1.5 (*)    Anion gap 16 (*)    All other components within normal limits  CBC WITH DIFFERENTIAL/PLATELET - Abnormal; Notable for the following components:   Hemoglobin 11.7 (*)    RDW 16.0 (*)    All other components within normal limits  LACTIC ACID, PLASMA - Abnormal; Notable for the following components:   Lactic Acid, Venous 2.8 (*)    All other components within normal limits  SARS CORONAVIRUS 2 (TAT 6-24 HRS)  LIPASE, BLOOD  URINALYSIS, ROUTINE W REFLEX MICROSCOPIC  I-STAT CREATININE, ED      EKG None  Radiology Mr Jeri Cos And Wo Contrast  Result Date: 03/21/2019 CLINICAL DATA:  Metastatic lung cancer.  Post whole-brain radiation. EXAM: MRI HEAD WITHOUT AND WITH CONTRAST TECHNIQUE: Multiplanar, multiecho pulse sequences of the brain and surrounding structures were obtained without and with intravenous contrast. CONTRAST:  68mL GADAVIST GADOBUTROL 1 MMOL/ML IV SOLN COMPARISON:  MRI head 11/18/2018 FINDINGS: Brain: Known metastatic disease to the brain. 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 Leptomeningeal enhancement right occipital lobe stable, axial image 66 5 mm enhancing nodule posterior splenium of corpus callosum is new 5 x 8 mm enhancing nodule right posterior parietal lobe has progressed in the interval. Axial image 97 Punctate enhancement right posterior frontal lobe axial image 113 unchanged. Punctate calcification left medial frontal lobe unchanged axial image 112 Leptomeningeal enhancement right frontal convexity unchanged. Generalized atrophy. Mild ventricular enlargement. Mild white matter changes have progressed since the prior study. Progressive FLAIR hyperintensity right temporal lobe without restricted diffusion or enhancement. Possible radiation change. Negative for acute infarct Vascular: Normal arterial flow voids Skull and upper cervical spine: No focal skeletal lesions. Sinuses/Orbits: Mild mucosal edema paranasal sinuses.  Normal orbit Other: None IMPRESSION: New metastatic deposit in the posterior splenium corpus callosum. Progression of metastatic disease right posterior parietal lobe. Multiple additional treated lesions are stable in the brain. Progressive white matter changes. Progressive FLAIR hyperintensity in the right temporal lobe. These findings are likely due to treatment. Electronically Signed   By: Franchot Gallo M.D.   On: 03/21/2019 15:54   Dg Chest Portable 1  View  Result Date: 03/21/2019 CLINICAL DATA:  Weakness and tremor. History of stage IV lung carcinoma EXAM: PORTABLE CHEST 1 VIEW COMPARISON:  September 20, 2018 chest radiograph and chest CT December 12, 2018 FINDINGS: There is volume loss and fibrosis in the left upper lobe, likely due to previous radiation therapy change. This appearance is  stable. There is airspace opacity in the left base, concerning for pneumonia. Right lung is clear. Heart size is normal. Pulmonary vascular on the right is normal. There is distortion of pulmonary vascularity on the left due to the apparent post radiation therapy change. No adenopathy is appreciable by radiography. No bone lesions. IMPRESSION: 1.Apparent scarring and fibrosis in the left upper lobe medially with changes felt to be secondary to post radiation therapy. 2.  Airspace opacity consistent with pneumonia left base. 3.  Right lung clear. 4. Stable cardiac silhouette. No adenopathy appreciable by radiography. Electronically Signed   By: Lowella Grip III M.D.   On: 03/21/2019 15:57    Procedures Procedures (including critical care time)  Medications Ordered in ED Medications  sodium chloride 0.9 % bolus 1,000 mL (0 mLs Intravenous Stopped 03/21/19 1655)    And  0.9 %  sodium chloride infusion (has no administration in time range)  LORazepam (ATIVAN) injection 1 mg (1 mg Intravenous Not Given 03/21/19 1650)  levofloxacin (LEVAQUIN) IVPB 750 mg (750 mg Intravenous New Bag/Given 03/21/19 1652)  ondansetron (ZOFRAN) injection 4 mg (4 mg Intravenous Given 03/21/19 1352)  gadobutrol (GADAVIST) 1 MMOL/ML injection 7 mL (7 mLs Intravenous Contrast Given 03/21/19 1449)     Initial Impression / Assessment and Plan / ED Course  I have reviewed the triage vital signs and the nursing notes.  Pertinent labs & imaging results that were available during my care of the patient were reviewed by me and considered in my medical decision making (see chart for  details).    After the initial evaluation I reviewed the patient's chart including documentation from her oncologist noting progression of her weakness, intermittent hospitalizations, therapy inconsistently, and recent cessation of herTagrisso due to lack of recent follow-up.    5:10 PM On repeat exam patient is in similar condition, now accompanied by her husband. With concern for lactic acidosis, and x-ray suggesting pneumonia, patient will require initiation of antibiotics. More concerning, the patient has MRI evidence for progression of disease, with increasing metastatic burden intracranially. This is likely contributing to the patient's worsening tremor. Given concern for increasing metastatic burden, and pneumonia, the patient be admitted for further monitoring, management.  Final Clinical Impressions(s) / ED Diagnoses   Final diagnoses:  Community acquired pneumonia of left lower lobe of lung  Brain metastasis ([place])     Carmin Muskrat, MD 03/21/19 1711

## 2019-03-21 NOTE — Plan of Care (Signed)
RN will continue to monitor patient

## 2019-03-21 NOTE — ED Triage Notes (Signed)
Pt arrived from home via [facility] Tremors Upper lower extremities and involuntary closing of right eye. Hx stage 4 lung cancer/brain, HTN, DVT. Last chemo drug was admin Sunday night 10/18. Pt denies pain but endorses nausea. A/OX4

## 2019-03-22 DIAGNOSIS — R112 Nausea with vomiting, unspecified: Secondary | ICD-10-CM

## 2019-03-22 LAB — TSH: TSH: <0.01 u[IU]/mL — ABNORMAL LOW (ref 0.350–4.500)

## 2019-03-22 LAB — COMPREHENSIVE METABOLIC PANEL
ALT: 10 U/L (ref 0–44)
AST: 25 U/L (ref 15–41)
Albumin: 2.7 g/dL — ABNORMAL LOW (ref 3.5–5.0)
Alkaline Phosphatase: 115 U/L (ref 38–126)
Anion gap: 13 (ref 5–15)
BUN: 25 mg/dL — ABNORMAL HIGH (ref 8–23)
CO2: 23 mmol/L (ref 22–32)
Calcium: 8.4 mg/dL — ABNORMAL LOW (ref 8.9–10.3)
Chloride: 101 mmol/L (ref 98–111)
Creatinine, Ser: 0.73 mg/dL (ref 0.44–1.00)
GFR calc Af Amer: >60 mL/min (ref >60)
GFR calc non Af Amer: >60 mL/min (ref >60)
Glucose, Bld: 84 mg/dL (ref 70–99)
Potassium: 3.3 mmol/L — ABNORMAL LOW (ref 3.5–5.1)
Sodium: 137 mmol/L (ref 135–145)
Total Bilirubin: 1.5 mg/dL — ABNORMAL HIGH (ref 0.3–1.2)
Total Protein: 5.9 g/dL — ABNORMAL LOW (ref 6.5–8.1)

## 2019-03-22 LAB — CBC
HCT: 32.5 % — ABNORMAL LOW (ref 36.0–46.0)
Hemoglobin: 10.2 g/dL — ABNORMAL LOW (ref 12.0–15.0)
MCH: 29.5 pg (ref 26.0–34.0)
MCHC: 31.4 g/dL (ref 30.0–36.0)
MCV: 93.9 fL (ref 80.0–100.0)
Platelets: 143 10*3/uL — ABNORMAL LOW (ref 150–400)
RBC: 3.46 MIL/uL — ABNORMAL LOW (ref 3.87–5.11)
RDW: 16.2 % — ABNORMAL HIGH (ref 11.5–15.5)
WBC: 5.5 10*3/uL (ref 4.0–10.5)
nRBC: 0 % (ref 0.0–0.2)

## 2019-03-22 LAB — MRSA PCR SCREENING: MRSA by PCR: POSITIVE — AB

## 2019-03-22 LAB — HIV ANTIBODY (ROUTINE TESTING W REFLEX): HIV Screen 4th Generation wRfx: NONREACTIVE

## 2019-03-22 LAB — PREALBUMIN: Prealbumin: 7.9 mg/dL — ABNORMAL LOW (ref 18–38)

## 2019-03-22 LAB — LACTIC ACID, PLASMA: Lactic Acid, Venous: 1.7 mmol/L (ref 0.5–1.9)

## 2019-03-22 LAB — PHOSPHORUS: Phosphorus: 3 mg/dL (ref 2.5–4.6)

## 2019-03-22 LAB — SARS CORONAVIRUS 2 (TAT 6-24 HRS): SARS Coronavirus 2: NEGATIVE

## 2019-03-22 LAB — MAGNESIUM: Magnesium: 1.9 mg/dL (ref 1.7–2.4)

## 2019-03-22 MED ORDER — SODIUM CHLORIDE 0.9 % IV SOLN
INTRAVENOUS | Status: AC
  Administered 2019-03-22 – 2019-03-23 (×2): via INTRAVENOUS

## 2019-03-22 MED ORDER — LIP MEDEX EX OINT
TOPICAL_OINTMENT | CUTANEOUS | Status: AC
  Administered 2019-03-22: 10:00:00
  Filled 2019-03-22: qty 7

## 2019-03-22 NOTE — Progress Notes (Signed)
PROGRESS NOTE    Debra Hodges  ATF:[id number] DOB: Sep 01, 1950 DOA: 03/21/2019 PCP: Patient, No Pcp Per   Brief Narrative:  HPI per Dr. Toy Baker on 03/21/2019 Debra Hodges is a 68 y.o. female with medical history significant of metastatic non-small cell lung cancer, metastatic to liver, brain on oral chemotherapy, hypertension, DVT since 2019, on Eliquis  bed bound  Presented with   Tremors and right eye droopiness, increased nausea and vomiting Tagrisso,her last dose was 4 days ago. Reports tremor has been going on for past few weeks but has progressively getting worse.  Usually involving her right leg and right arm Last time patient was admitted in July for dehydration in the setting of nausea and vomiting  Reports for the past week her right leg have been weeker she is unable to walk . Her oncologist recommended stopping her Chemo due to ongoing nausea and vomiting  She continues to take Eliquis  denies any recent bleeding  Infectious risk factors:  Reports dry cough, In  ER RAPID COVID TEST  in house testing  Pending  Recent Labs       Lab Results  Component Value Date   [place] 12/17/2018   [place] NEGATIVE 12/11/2018   [place] NEGATIVE 11/17/2018       Regarding pertinent Chronic problems:    Non-small cell lung cancer stage IV diagnosed since 2015 this post whole brain XRT  Hx of DVT on Eliquis  Hypertension takes spironolactone   Hypothyroidism:  Recent Labs       Lab Results  Component Value Date   TSH <0.010 (L) 12/12/2018     on synthroid     While in ER: Noted to have elevated lactic acid up to 2.8 Hypokalemia of potassium down to 3.2 The following Work up has been ordered so far: Worrisome for left lower lobe pneumonia MRI showing progression of her brain metastases Plan for patient to get Decadron She received first dose in emergency department and had vomiting  **Interim History Feels  the same as yesterday but nausea vomiting is improved.  SLP recommending soft diet.  We will continue IV fluid hydration for today and will consider discussing with neuro oncology in a.m.  Assessment & Plan:   Active Problems:   Non-small cell carcinoma of lung, stage 4 (HCC)   Metastases to the liver [facility])   Hypothyroidism   Non-small cell lung cancer with metastasis (HCC)   Nausea and vomiting   Dehydration   Hypokalemia   Brain metastasis (HCC)   Sepsis (HCC)  Brain metastasis (HCC)  -initiate Decadron 4 mg q12h  -goal is for patient able to tolerate p.o. switch to iV  -appreciate oncology input and may need to discuss with Dr. Mickeal Skinner  Non-small cell carcinoma of lung, stage 4 ([place]) -  oncology is aware  Metastases to the liver ([place])  -Continue to monitor LFT's  Sepsis from Pneumonia  -SIRS criteria met with tachycardia, elevated LA   With evidence of end organ damage such as  elevated lactic acid    -Most likely source being: pulmonary   -Obtain serial lactic acid and procalcitonin level.  -Initiate IV antibiotics and C/w IV Levofloxacin for CAP  -await results of blood and urine culture  -Rehydrate   -CXR showed "Apparent scarring and fibrosis in the left upper lobe medially with changes felt to be secondary to post radiation therapy. Airspace opacity consistent with pneumonia left base. Right lung clear. Stable cardiac silhouette. No adenopathy appreciable by radiography." -  Repeat CXR in AM   Hypothyroidism  -Checked TSH  And was <0.010 and will check Free T4 -Takes Levothyroxine at 25 mcg po Daily and may need adjustments   Intractable Nausea and vomiting  -as noted in dehydration secondary to brain metastases -C/w Supportive cCare with IVF Hydration, Antiemetics  -SLP evaluated and recommending Soft Diet   Dehydration  -hydrate and follow fluid status   Hypokalemia -Will replace and repeat in AM,  check magnesium level and replace as needed  Poor  Nutritional status  -check prealbumin in obtain nutrition consult  Hx of DVT  -continue Eliquis, pt is at high risk for clotting   Hyperbilirubinemia -Likely in the setting of Liver Metastasis -Continue to Monitor and Trend -Repeat CMP in AM   Normocytic Anemia -Patient's Hb/Hct went from 11.7/36.9 -> 10.2/32.5 -Check Anemia Panel in the AM -Continue to Monitor for S/Sx of Bleeding; Currently no overt bleeding noted  -Repeat CBC in AM    DVT prophylaxis: Anticoagulated with Eliquis Code Status: DO NOT RESUSCITATE  Family Communication: No family present at bedside  Disposition Plan: Pending further improvement and workup  Consultants:   Oncology  Procedures: None  Antimicrobials:  Anti-infectives (From admission, onward)   Start     Dose/Rate Route Frequency Ordered Stop   03/22/19 1600  levofloxacin (LEVAQUIN) IVPB 750 mg     750 mg 100 mL/hr over 90 Minutes Intravenous Every 24 hours 03/21/19 1928     03/21/19 1615  levofloxacin (LEVAQUIN) IVPB 750 mg     750 mg 100 mL/hr over 90 Minutes Intravenous  Once 03/21/19 1610 03/21/19 1822     Subjective: Seen and examined at bedside and she appeared chronically ill-appearing and complained of feeling the same but states her nausea vomiting is improved.  No chest pain, lightheadedness or dizziness but feels fatigued and has a headache.  No other concerns at present this time.  Objective: Vitals:   03/22/19 1256 03/22/19 1600 03/22/19 1634 03/22/19 1701  BP:  (!) 89/14 (!) 98/53   Pulse:  (!) 112 (!) 114   Resp:  (!) 23 20   Temp: 97.9 F (36.6 C)   97.7 F (36.5 C)  TempSrc: Oral   Oral  SpO2:  100% 100%   Weight:      Height:        Intake/Output Summary (Last 24 hours) at 03/22/2019 1904 Last data filed at 03/22/2019 1800 Gross per 24 hour  Intake 1183.51 ml  Output 150 ml  Net 1033.51 ml   Filed Weights   03/21/19 1354  Weight: 74.8 kg   Examination: Physical Exam:  Constitutional: Chronically  ill-appearing overweight Caucasian female in mild distress appears uncomfortable Eyes: Lids and conjunctivae normal, sclerae anicteric  ENMT: External Ears, Nose appear normal. Grossly normal hearing.  Neck: Appears normal, supple, no cervical masses, normal ROM, no appreciable thyromegaly; no JVD Respiratory: Diminished to auscultation bilaterally, no wheezing, rales, rhonchi or crackles. Normal respiratory effort and patient is not tachypenic. No accessory muscle use. Unlabored breathing  Cardiovascular: Mildly tachycardic rate but regular rhythm, no murmurs / rubs / gallops. S1 and S2 auscultated. Trace extremity edema.  Abdomen: Soft, non-tender, Distended. Bowel sounds positive x4.  GU: Deferred. Musculoskeletal: No clubbing / cyanosis of digits/nails. No joint deformity upper and lower extremities  Skin: No rashes, lesions, ulcers on a limited skin evaluation but has no hair from Chemo. No induration; Warm and dry.  Neurologic: CN 2-12 grossly intact with no focal deficits. Romberg sign  and cerebellar reflexes not assessed.  Psychiatric: Normal judgment and insight. Alert and oriented x 3. Anxious mood and appropriate affect.   Data Reviewed: I have personally reviewed following labs and imaging studies  CBC: Recent Labs  Lab 03/21/19 1359 03/22/19 0149  WBC 6.8 5.5  NEUTROABS 4.6  --   HGB 11.7* 10.2*  HCT 36.9 32.5*  MCV 93.2 93.9  PLT 186 170*   Basic Metabolic Panel: Recent Labs  Lab 03/21/19 1359 03/21/19 1406 03/21/19 2157 03/22/19 0149  NA 138  --   --  137  K 3.2*  --   --  3.3*  CL 98  --   --  101  CO2 24  --   --  23  GLUCOSE 99  --   --  84  BUN 28*  --   --  25*  CREATININE 0.89 0.70  --  0.73  CALCIUM 9.1  --   --  8.4*  MG  --   --  1.7 1.9  PHOS  --   --  2.8 3.0   GFR: Estimated Creatinine Clearance: 69.6 mL/min (by C-G formula based on SCr of 0.73 mg/dL). Liver Function Tests: Recent Labs  Lab 03/21/19 1359 03/22/19 0149  AST 30 25  ALT 13  10  ALKPHOS 153* 115  BILITOT 1.5* 1.5*  PROT 6.9 5.9*  ALBUMIN 3.0* 2.7*   Recent Labs  Lab 03/21/19 1359  LIPASE 28   No results for input(s): AMMONIA in the last 168 hours. Coagulation Profile: Recent Labs  Lab 03/21/19 2157  INR 1.7*   Cardiac Enzymes: Recent Labs  Lab 03/21/19 2157  CKTOTAL 27*   BNP (last 3 results) No results for input(s): PROBNP in the last 8760 hours. HbA1C: No results for input(s): HGBA1C in the last 72 hours. CBG: No results for input(s): GLUCAP in the last 168 hours. Lipid Profile: No results for input(s): CHOL, HDL, LDLCALC, TRIG, CHOLHDL, LDLDIRECT in the last 72 hours. Thyroid Function Tests: Recent Labs    03/22/19 0149  TSH <0.010*   Anemia Panel: No results for input(s): VITAMINB12, FOLATE, FERRITIN, TIBC, IRON, RETICCTPCT in the last 72 hours. Sepsis Labs: Recent Labs  Lab 03/21/19 1400 03/21/19 2157 03/22/19 0149  PROCALCITON  --  <0.10  --   LATICACIDVEN 2.8* 1.8 1.7    Recent Results (from the past 240 hour(s))  SARS CORONAVIRUS 2 (TAT 6-24 HRS) Nasopharyngeal Nasopharyngeal Swab     Status: None   Collection Time: 03/21/19  4:47 PM   Specimen: Nasopharyngeal Swab  Result Value Ref Range Status   SARS Coronavirus 2 NEGATIVE NEGATIVE Final    Comment: (NOTE) SARS-CoV-2 target nucleic acids are NOT DETECTED. The SARS-CoV-2 RNA is generally detectable in upper and lower respiratory specimens during the acute phase of infection. Negative results do not preclude SARS-CoV-2 infection, do not rule out co-infections with other pathogens, and should not be used as the sole basis for treatment or other patient management decisions. Negative results must be combined with clinical observations, patient history, and epidemiological information. The expected result is Negative. Fact Sheet for Patients: SugarRoll.be Fact Sheet for Healthcare Providers: https://www.woods-mathews.com/  This test is not yet approved or cleared by the [place] FDA and  has been authorized for detection and/or diagnosis of SARS-CoV-2 by FDA under an Emergency Use Authorization (EUA). This EUA will remain  in effect (meaning this test can be used) for the duration of the COVID-19 declaration under Section 56 4(b)(1) of the  Act, 21 U.S.C. section 360bbb-3(b)(1), unless the authorization is terminated or revoked sooner. Performed at [facility], [place] [address] [place], [place] [address]   MRSA PCR Screening     Status: Abnormal   Collection Time: 03/21/19  9:09 PM   Specimen: Nasopharyngeal  Result Value Ref Range Status   MRSA by PCR POSITIVE (A) NEGATIVE Final    Comment:        The GeneXpert MRSA Assay (FDA approved for NASAL specimens only), is one component of a comprehensive MRSA colonization surveillance program. It is not intended to diagnose MRSA infection nor to guide or monitor treatment for MRSA infections. RESULT CALLED TO, READ BACK BY AND VERIFIED WITH: DAVIS,W RN '@[id number]'$  ON 03/22/2019 JACKSON,K Performed at [facility], [place] [address], [place], [place] [address]     RN Pressure Injury Documentation and I am in Agreement with the RN's Documentation Pressure Injury 03/21/19 Coccyx Mid Stage II -  Partial thickness loss of dermis presenting as a shallow open ulcer with a red, pink wound bed without slough. (Active)  03/21/19 2130  Location: Coccyx  Location Orientation: Mid  Staging: Stage II -  Partial thickness loss of dermis presenting as a shallow open ulcer with a red, pink wound bed without slough.  Wound Description (Comments):   Present on Admission: Yes   Radiology Studies: Mr Jeri Cos And Wo Contrast  Result Date: 03/21/2019 CLINICAL DATA:  Metastatic lung cancer.  Post whole-brain radiation. EXAM: MRI HEAD WITHOUT AND WITH CONTRAST TECHNIQUE: Multiplanar, multiecho pulse sequences of the brain and surrounding structures  were obtained without and with intravenous contrast. CONTRAST:  4m GADAVIST GADOBUTROL 1 MMOL/ML IV SOLN COMPARISON:  MRI head 11/18/2018 FINDINGS: Brain: Known metastatic disease to the brain. 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 Leptomeningeal enhancement right occipital lobe stable, axial image 66 5 mm enhancing nodule posterior splenium of corpus callosum is new 5 x 8 mm enhancing nodule right posterior parietal lobe has progressed in the interval. Axial image 97 Punctate enhancement right posterior frontal lobe axial image 113 unchanged. Punctate calcification left medial frontal lobe unchanged axial image 112 Leptomeningeal enhancement right frontal convexity unchanged. Generalized atrophy. Mild ventricular enlargement. Mild white matter changes have progressed since the prior study. Progressive FLAIR hyperintensity right temporal lobe without restricted diffusion or enhancement. Possible radiation change. Negative for acute infarct Vascular: Normal arterial flow voids Skull and upper cervical spine: No focal skeletal lesions. Sinuses/Orbits: Mild mucosal edema paranasal sinuses.  Normal orbit Other: None IMPRESSION: New metastatic deposit in the posterior splenium corpus callosum. Progression of metastatic disease right posterior parietal lobe. Multiple additional treated lesions are stable in the brain. Progressive white matter changes. Progressive FLAIR hyperintensity in the right temporal lobe. These findings are likely due to treatment. Electronically Signed   By: CFranchot GalloM.D.   On: 03/21/2019 15:54   Dg Chest Portable 1 View  Result Date: 03/21/2019 CLINICAL DATA:  Weakness and tremor. History of stage IV lung carcinoma EXAM: PORTABLE CHEST 1 VIEW COMPARISON:  September 20, 2018 chest radiograph and chest CT December 12, 2018 FINDINGS: There is volume loss and fibrosis in the left upper lobe, likely due to  previous radiation therapy change. This appearance is stable. There is airspace opacity in the left base, concerning for pneumonia. Right lung is clear. Heart size is normal. Pulmonary vascular on the right is normal. There is distortion of pulmonary vascularity  on the left due to the apparent post radiation therapy change. No adenopathy is appreciable by radiography. No bone lesions. IMPRESSION: 1.Apparent scarring and fibrosis in the left upper lobe medially with changes felt to be secondary to post radiation therapy. 2.  Airspace opacity consistent with pneumonia left base. 3.  Right lung clear. 4. Stable cardiac silhouette. No adenopathy appreciable by radiography. Electronically Signed   By: Lowella Grip III M.D.   On: 03/21/2019 15:57   Scheduled Meds: . apixaban  5 mg Oral BID  . Chlorhexidine Gluconate Cloth  6 each Topical Daily  . dexamethasone (DECADRON) injection  4 mg Intravenous Q12H  . gabapentin  100 mg Oral TID  . levothyroxine  25 mcg Oral Q0600  . LORazepam  1 mg Intravenous Once  . mouth rinse  15 mL Mouth Rinse BID  . senna  1 tablet Oral BID   Continuous Infusions: . sodium chloride Stopped (03/22/19 1523)  . levofloxacin (LEVAQUIN) IV Stopped (03/22/19 1653)  . sodium chloride      LOS: 1 day    Kerney Elbe, DO [facility] PAGER is on AMION  If 7PM-7AM, please contact night-coverage [email or web address] Password [email or web address] 03/22/2019, 7:04 PM

## 2019-03-22 NOTE — Evaluation (Signed)
Clinical/Bedside Swallow Evaluation Patient Details  Name: Debra Hodges MRN: [id number] Date of Birth: Oct 28, 1950  Today's Date: 03/22/2019 Time: SLP Start Time (ACUTE ONLY): 1449 SLP Stop Time (ACUTE ONLY): 1512 SLP Time Calculation (min) (ACUTE ONLY): 23 min  Past Medical History:  Past Medical History:  Diagnosis Date  . High blood pressure   . Liver lesion 06/26/2016  . Non-small cell carcinoma of lung, stage 4 ([place]) dx'd 10/2013   Past Surgical History:  Past Surgical History:  Procedure Laterality Date  . ABDOMINAL HYSTERECTOMY    . BREAST BIOPSY     Right breast  . VIDEO BRONCHOSCOPY Bilateral 10/27/2013   Procedure: VIDEO BRONCHOSCOPY WITH FLUORO;  Surgeon: Collene Gobble, MD;  Location: [facility];  Service: Cardiopulmonary;  Laterality: Bilateral;   HPI:  68 y.o. female with medical history significant of metastatic non-small cell lung cancer, metastatic to liver, brain on oral chemotherapy, hypertension, DVT since 2019, on Eliquis; CXR on 03/21/19 indicated Airspace opacity consistent with pneumonia left base; BSE generated.  Assessment / Plan / Recommendation Clinical Impression  BSE revealed normal oropharyngeal swallow with consistencies assessed; pt c/o decreased appetite, min oral pain with chewing/eating d/t oral ulcers post-chemo/radiation; refused solids this date; prefers soft foods/thin liquids; no overt s/s of aspiration noted during BSE and/or reported by nursing with medication administration; initiate soft/thin liquid diet with general swallowing precautions in place d/t generalized weakness.  ST will f/u for diet tolerance 1-2 visits; thank you for this consult. SLP Visit Diagnosis: Dysphagia, unspecified (R13.10)    Aspiration Risk  Mild aspiration risk    Diet Recommendation   Soft diet/thin liquids  Medication Administration: Whole meds with liquid    Other  Recommendations Oral Care Recommendations: Oral care BID   Follow up Recommendations None       Frequency and Duration min 1 x/week  1 week       Prognosis Prognosis for Safe Diet Advancement: Good      Swallow Study   General Date of Onset: 03/21/19 HPI: 68 y.o. female with medical history significant of metastatic non-small cell lung cancer, metastatic to liver, brain on oral chemotherapy, hypertension, DVT since 2019, on Eliquis Type of Study: Bedside Swallow Evaluation Previous Swallow Assessment: n/a Diet Prior to this Study: NPO Temperature Spikes Noted: No Respiratory Status: Room air History of Recent Intubation: No Behavior/Cognition: Alert;Cooperative Oral Cavity Assessment: Dry;Other (comment)(oral ulcers post-chemo) Oral Care Completed by SLP: Recent completion by staff Oral Cavity - Dentition: Adequate natural dentition Self-Feeding Abilities: Needs assist;Needs set up Patient Positioning: Upright in bed Baseline Vocal Quality: Low vocal intensity Volitional Cough: Weak Volitional Swallow: Able to elicit    Oral/Motor/Sensory Function Overall Oral Motor/Sensory Function: Within functional limits   Ice Chips Ice chips: Within functional limits Presentation: Spoon   Thin Liquid Thin Liquid: Within functional limits Presentation: Cup;Straw    Nectar Thick Nectar Thick Liquid: Not tested   Honey Thick Honey Thick Liquid: Not tested   Puree Puree: Within functional limits Presentation: Spoon   Solid     Solid: Not tested      Elvina Sidle, M.S., CCC-SLP 03/22/2019,3:58 PM

## 2019-03-23 ENCOUNTER — Inpatient Hospital Stay (HOSPITAL_COMMUNITY): Payer: Medicare Other

## 2019-03-23 ENCOUNTER — Other Ambulatory Visit: Payer: Self-pay | Admitting: Oncology

## 2019-03-23 LAB — URINALYSIS, ROUTINE W REFLEX MICROSCOPIC
Bacteria, UA: NONE SEEN
Bilirubin Urine: NEGATIVE
Glucose, UA: NEGATIVE mg/dL
Ketones, ur: 20 mg/dL — AB
Leukocytes,Ua: NEGATIVE
Nitrite: NEGATIVE
Protein, ur: 100 mg/dL — AB
RBC / HPF: >50 RBC/hpf — ABNORMAL HIGH (ref 0–5)
Specific Gravity, Urine: 1.02 (ref 1.005–1.030)
pH: 6 (ref 5.0–8.0)

## 2019-03-23 LAB — CBC
HCT: 28.3 % — ABNORMAL LOW (ref 36.0–46.0)
HCT: 28 % — ABNORMAL LOW (ref 36.0–46.0)
Hemoglobin: 8.8 g/dL — ABNORMAL LOW (ref 12.0–15.0)
Hemoglobin: 9.2 g/dL — ABNORMAL LOW (ref 12.0–15.0)
MCH: 29.3 pg (ref 26.0–34.0)
MCH: 30.2 pg (ref 26.0–34.0)
MCHC: 31.1 g/dL (ref 30.0–36.0)
MCHC: 32.9 g/dL (ref 30.0–36.0)
MCV: 94.3 fL (ref 80.0–100.0)
MCV: 91.8 fL (ref 80.0–100.0)
Platelets: 128 10*3/uL — ABNORMAL LOW (ref 150–400)
Platelets: 141 10*3/uL — ABNORMAL LOW (ref 150–400)
RBC: 3 MIL/uL — ABNORMAL LOW (ref 3.87–5.11)
RBC: 3.05 MIL/uL — ABNORMAL LOW (ref 3.87–5.11)
RDW: 16.3 % — ABNORMAL HIGH (ref 11.5–15.5)
RDW: 16.4 % — ABNORMAL HIGH (ref 11.5–15.5)
WBC: 6.4 10*3/uL (ref 4.0–10.5)
WBC: 8.1 10*3/uL (ref 4.0–10.5)
nRBC: 0 % (ref 0.0–0.2)
nRBC: 0 % (ref 0.0–0.2)

## 2019-03-23 LAB — COMPREHENSIVE METABOLIC PANEL
ALT: 13 U/L (ref 0–44)
AST: 26 U/L (ref 15–41)
Albumin: 2.4 g/dL — ABNORMAL LOW (ref 3.5–5.0)
Alkaline Phosphatase: 112 U/L (ref 38–126)
Anion gap: 7 (ref 5–15)
BUN: 24 mg/dL — ABNORMAL HIGH (ref 8–23)
CO2: 23 mmol/L (ref 22–32)
Calcium: 8.1 mg/dL — ABNORMAL LOW (ref 8.9–10.3)
Chloride: 106 mmol/L (ref 98–111)
Creatinine, Ser: 0.63 mg/dL (ref 0.44–1.00)
GFR calc Af Amer: >60 mL/min (ref >60)
GFR calc non Af Amer: >60 mL/min (ref >60)
Glucose, Bld: 115 mg/dL — ABNORMAL HIGH (ref 70–99)
Potassium: 4 mmol/L (ref 3.5–5.1)
Sodium: 136 mmol/L (ref 135–145)
Total Bilirubin: 1 mg/dL (ref 0.3–1.2)
Total Protein: 5.1 g/dL — ABNORMAL LOW (ref 6.5–8.1)

## 2019-03-23 LAB — MAGNESIUM: Magnesium: 1.7 mg/dL (ref 1.7–2.4)

## 2019-03-23 LAB — PHOSPHORUS: Phosphorus: 2.1 mg/dL — ABNORMAL LOW (ref 2.5–4.6)

## 2019-03-23 MED ORDER — PRO-STAT SUGAR FREE PO LIQD
30.0000 mL | Freq: Two times a day (BID) | ORAL | Status: DC
  Administered 2019-03-26: 19:00:00 30 mL via ORAL
  Filled 2019-03-23 (×5): qty 30

## 2019-03-23 MED ORDER — POTASSIUM PHOSPHATES 15 MMOLE/5ML IV SOLN
20.0000 mmol | Freq: Once | INTRAVENOUS | Status: AC
  Administered 2019-03-23: 20 mmol via INTRAVENOUS
  Filled 2019-03-23: qty 6.67

## 2019-03-23 MED ORDER — MUPIROCIN 2 % EX OINT
1.0000 "application " | TOPICAL_OINTMENT | Freq: Two times a day (BID) | CUTANEOUS | Status: AC
  Administered 2019-03-23 – 2019-03-27 (×10): 1 via NASAL
  Filled 2019-03-23: qty 22

## 2019-03-23 MED ORDER — ENSURE ENLIVE PO LIQD
237.0000 mL | Freq: Two times a day (BID) | ORAL | Status: DC
  Administered 2019-03-23 – 2019-03-27 (×8): 237 mL via ORAL

## 2019-03-23 NOTE — Progress Notes (Signed)
PROGRESS NOTE    Debra Hodges  XVQ:[id number] DOB: 1950-12-09 DOA: 03/21/2019 PCP: Patient, No Pcp Per   Brief Narrative:  HPI per Dr. Toy Baker on 03/21/2019 Debra Hodges is a 68 y.o. female with medical history significant of metastatic non-small cell lung cancer, metastatic to liver, brain on oral chemotherapy, hypertension, DVT since 2019, on Eliquis  bed bound  Presented with   Tremors and right eye droopiness, increased nausea and vomiting [place],her last dose was 4 days ago. Reports tremor has been going on for past few weeks but has progressively getting worse.  Usually involving her right leg and right arm Last time patient was admitted in July for dehydration in the setting of nausea and vomiting  Reports for the past week her right leg have been weeker she is unable to walk . Her oncologist recommended stopping her Chemo due to ongoing nausea and vomiting  She continues to take Eliquis  denies any recent bleeding  Infectious risk factors:  Reports dry cough, In  ER RAPID COVID TEST  in house testing  Pending  Recent Labs       Lab Results  Component Value Date   [place] 12/17/2018   [place] NEGATIVE 12/11/2018   [place] NEGATIVE 11/17/2018       Regarding pertinent Chronic problems:    Non-small cell lung cancer stage IV diagnosed since 2015 this post whole brain XRT  Hx of DVT on Eliquis  Hypertension takes spironolactone   Hypothyroidism:  Recent Labs       Lab Results  Component Value Date   TSH <0.010 (L) 12/12/2018     on synthroid     While in ER: Noted to have elevated lactic acid up to 2.8 Hypokalemia of potassium down to 3.2 The following Work up has been ordered so far: Worrisome for left lower lobe pneumonia MRI showing progression of her brain metastases Plan for patient to get Decadron She received first dose in emergency department and had vomiting  **Interim History Feels  the same as yesterday but nausea vomiting is improved.  SLP recommending soft diet.  We will continue IV fluid hydration for today and treatment.  Overnight had some questionable hematuria and urine today is tea colored.  Will need to continue to monitor and obtain urinalysis and urine culture and will need to discuss with neuro oncology about brain metastasis in a.m.  Assessment & Plan:   Active Problems:   Non-small cell carcinoma of lung, stage 4 (HCC)   Metastases to the liver [facility])   Hypothyroidism   Non-small cell lung cancer with metastasis (HCC)   Nausea and vomiting   Dehydration   Hypokalemia   Brain metastasis (HCC)   Sepsis ([place])  Brain metastasis ([place]) -Showed a new metastatic deposit in the posterior splenium corpus callosum and had progression of metastatic disease in the right posterior parietal lobe  -initiate Decadron 4 mg q12h  -goal is for patient able to tolerate p.o. switch to iV  -appreciate oncology input and may need to discuss with Dr. Mickeal Skinner.  Will ask for his assistance in the a.m.  Non-small cell carcinoma of lung, stage 4 (HCC) -  -Oncology is aware and I have added Dr. Earlie Server to the care team and will appreciate further evaluation recommendations  Metastases to the liver [facility])  -Continue to monitor LFT's and T Bili -Normalized now.   Sepsis from Pneumonia, improving -SIRS criteria met with tachycardia, elevated LA   With evidence of end organ damage  such as  elevated lactic acid    -Most likely source being: pulmonary   -Obtain serial lactic acid and procalcitonin level.  -Initiate IV antibiotics and C/w IV Levofloxacin for CAP  -Await results of blood and urine culture  -Rehydrate with normal saline rate of 75 mL's per hour for another day -CXR showed "Stable chest compared to recent prior study. Apparent radiation therapy change with volume loss and scarring left upper lobe. Ill-defined opacity left base, suspicious for pneumonia. No new  opacity. Stable cardiac silhouette." -Repeat CXR in AM   Hypothyroidism  -Checked TSH  And was <0.010 and will check Free T4 -Takes Levothyroxine at 25 mcg po Daily and may need adjustments   Intractable Nausea and vomiting, improving    -as noted in dehydration secondary to brain metastases -C/w Supportive cCare with IVF Hydration, Antiemetics  -SLP evaluated and recommending Soft Diet if tolerated   Dehydration  -hydrate and follow fluid status  and resume fluids at 75 mL's per hour  Hypophosphatemia -Patient is phosphorus level was 2.1 -Replete with IV K Phos 20 mmol -Continue to Monitor and Replete as Necessary -Repeat Phos Level in AM   Hypokalemia -Will replace and repeat in AM,  check magnesium level and replace as needed  Poor Nutritional status  -check prealbumin in obtain nutrition consult  Hx of DVT  -Holding Eliquis given her recent hematuria, pt is at high risk for clotting   Hyperbilirubinemia -Likely in the setting of Liver Metastasis -T bili went from 1.5 is now 1.0 -Continue to Monitor and Trend -Repeat CMP in AM   Normocytic Anemia -Patient's Hb/Hct went from 11.7/36.9 -> 10.2/32.5 -> 8.8/28.3 -> 9.2/28.0 -Check Anemia Panel in the AM -Continue to Monitor for S/Sx of Bleeding; Currently no overt bleeding noted  -Repeat CBC in AM   Questionable Hematuria and possible UTI -Overnight there is report of some hematuria but urine was tea colored per nursing this morning -Urinalysis showed a hazy appearance with large hemoglobin, 20 ketones, no bacteria seen, but greater than 50 RBCs per high-power field -We will continue IV fluid hydration with normal saline as above -Hold Eliquis currently for now and reevaluate -May need urology assistance  Thrombocytopenia -Patient platelet count went from 143,000 down to 120,000 and is now 141,000 -Had some questionable hematuria overnight and her urine in the pubic is tea colored -Urinalysis showed some large  hemoglobin and RBCs per high-power field Plan continue to monitor and may need urological assistance and will hold Eliquis for today -Repeat CBC in the a.m. and continue to monitor for signs and symptoms of bleeding  DVT prophylaxis: Anticoagulated with Eliquis Code Status: DO NOT RESUSCITATE  Family Communication: No family present at bedside  Disposition Plan: Pending further improvement and workup  Consultants:   Oncology  Procedures: None  Antimicrobials:  Anti-infectives (From admission, onward)   Start     Dose/Rate Route Frequency Ordered Stop   03/22/19 1600  levofloxacin (LEVAQUIN) IVPB 750 mg     750 mg 100 mL/hr over 90 Minutes Intravenous Every 24 hours 03/21/19 1928     03/21/19 1615  levofloxacin (LEVAQUIN) IVPB 750 mg     750 mg 100 mL/hr over 90 Minutes Intravenous  Once 03/21/19 1610 03/21/19 1822     Subjective: Seen and examined at bedside and she is getting a little bit better and nausea and vomiting is improved.  Tremors are not as much but overnight she had some ? Hematuria and today urine is darker colored.  No CP or SOB. No other concerns or complaints at this time but is still weak.  Objective: Vitals:   03/23/19 1236 03/23/19 1443 03/23/19 1600 03/23/19 1635  BP:   (!) 80/40   Pulse:   78   Resp:   17   Temp: 97.6 F (36.4 C)   97.8 F (36.6 C)  TempSrc: Oral   Oral  SpO2:  99% 98%   Weight:      Height:        Intake/Output Summary (Last 24 hours) at 03/23/2019 1741 Last data filed at 03/23/2019 1503 Gross per 24 hour  Intake 2243.42 ml  Output 475 ml  Net 1768.42 ml   Filed Weights   03/21/19 1354  Weight: 74.8 kg   Examination: Physical Exam:  Constitutional: Chronically ill-appearing overweight Caucasian female in mild distress and appears uncomfortable again, Eyes: Lids and conjunctivae normal, sclerae anicteric  ENMT: External Ears, Nose appear normal. Grossly normal hearing.  Neck: Appears normal, supple, no cervical masses,  normal ROM, no appreciable thyromegaly; no JVD Respiratory: Diminished to auscultation bilaterally with coarse breath sounds, no wheezing, rales, rhonchi or crackles. Normal respiratory effort and patient is not tachypenic. No accessory muscle use.  Unlabored breathing and is not wearing any supplemental oxygen via nasal cannula Cardiovascular: Tachycardic rate but regular rhythm, no murmurs / rubs / gallops. S1 and S2 auscultated.  Mild lower extremity edema Abdomen: Soft, non-tender, Distended. No masses palpated. Bowel sounds positive x4.  GU: Deferred. Musculoskeletal: No clubbing / cyanosis of digits/nails. No joint deformity upper and lower extremities.   Skin: No rashes, lesions, ulcers on a limited skin evaluation but has no hair from Chemo. No induration; Warm and dry.  Neurologic: CN 2-12 grossly intact with no focal deficits. Has a Tremor from on the Right Arm. Romberg sign and  cerebellar reflexes not assessed.  Psychiatric: Normal judgment and insight. Alert and oriented x 3. Slightly depressed appearing mood and appropriate affect.    Data Reviewed: I have personally reviewed following labs and imaging studies  CBC: Recent Labs  Lab 03/21/19 1359 03/22/19 0149 03/23/19 0207 03/23/19 1115  WBC 6.8 5.5 6.4 8.1  NEUTROABS 4.6  --   --   --   HGB 11.7* 10.2* 8.8* 9.2*  HCT 36.9 32.5* 28.3* 28.0*  MCV 93.2 93.9 94.3 91.8  PLT 186 143* 128* 161*   Basic Metabolic Panel: Recent Labs  Lab 03/21/19 1359 03/21/19 1406 03/21/19 2157 03/22/19 0149 03/23/19 0207  NA 138  --   --  137 136  K 3.2*  --   --  3.3* 4.0  CL 98  --   --  101 106  CO2 24  --   --  23 23  GLUCOSE 99  --   --  84 115*  BUN 28*  --   --  25* 24*  CREATININE 0.89 0.70  --  0.73 0.63  CALCIUM 9.1  --   --  8.4* 8.1*  MG  --   --  1.7 1.9 1.7  PHOS  --   --  2.8 3.0 2.1*   GFR: Estimated Creatinine Clearance: 69.6 mL/min (by C-G formula based on SCr of 0.63 mg/dL). Liver Function Tests: Recent  Labs  Lab 03/21/19 1359 03/22/19 0149 03/23/19 0207  AST '30 25 26  '$ ALT '13 10 13  '$ ALKPHOS 153* 115 112  BILITOT 1.5* 1.5* 1.0  PROT 6.9 5.9* 5.1*  ALBUMIN 3.0* 2.7* 2.4*   Recent Labs  Lab 03/21/19 1359  LIPASE 28   No results for input(s): AMMONIA in the last 168 hours. Coagulation Profile: Recent Labs  Lab 03/21/19 2157  INR 1.7*   Cardiac Enzymes: Recent Labs  Lab 03/21/19 2157  CKTOTAL 27*   BNP (last 3 results) No results for input(s): PROBNP in the last 8760 hours. HbA1C: No results for input(s): HGBA1C in the last 72 hours. CBG: No results for input(s): GLUCAP in the last 168 hours. Lipid Profile: No results for input(s): CHOL, HDL, LDLCALC, TRIG, CHOLHDL, LDLDIRECT in the last 72 hours. Thyroid Function Tests: Recent Labs    03/22/19 0149  TSH <0.010*   Anemia Panel: No results for input(s): VITAMINB12, FOLATE, FERRITIN, TIBC, IRON, RETICCTPCT in the last 72 hours. Sepsis Labs: Recent Labs  Lab 03/21/19 1400 03/21/19 2157 03/22/19 0149  PROCALCITON  --  <0.10  --   LATICACIDVEN 2.8* 1.8 1.7    Recent Results (from the past 240 hour(s))  SARS CORONAVIRUS 2 (TAT 6-24 HRS) Nasopharyngeal Nasopharyngeal Swab     Status: None   Collection Time: 03/21/19  4:47 PM   Specimen: Nasopharyngeal Swab  Result Value Ref Range Status   SARS Coronavirus 2 NEGATIVE NEGATIVE Final    Comment: (NOTE) SARS-CoV-2 target nucleic acids are NOT DETECTED. The SARS-CoV-2 RNA is generally detectable in upper and lower respiratory specimens during the acute phase of infection. Negative results do not preclude SARS-CoV-2 infection, do not rule out co-infections with other pathogens, and should not be used as the sole basis for treatment or other patient management decisions. Negative results must be combined with clinical observations, patient history, and epidemiological information. The expected result is Negative. Fact Sheet for Patients:  SugarRoll.be Fact Sheet for Healthcare Providers: https://www.woods-mathews.com/ This test is not yet approved or cleared by the [place] FDA and  has been authorized for detection and/or diagnosis of SARS-CoV-2 by FDA under an Emergency Use Authorization (EUA). This EUA will remain  in effect (meaning this test can be used) for the duration of the COVID-19 declaration under Section 56 4(b)(1) of the Act, 21 U.S.C. section 360bbb-3(b)(1), unless the authorization is terminated or revoked sooner. Performed at [facility], [place] [address], [place], [place] [address]   Culture, blood (routine x 2) Call MD if unable to obtain prior to antibiotics being given     Status: None (Preliminary result)   Collection Time: 03/21/19  7:45 PM   Specimen: BLOOD  Result Value Ref Range Status   Specimen Description   Final    BLOOD RIGHT ANTECUBITAL Performed at [facility] [address] [place], [place] [address]    Special Requests   Final    BOTTLES DRAWN AEROBIC AND ANAEROBIC Blood Culture results may not be optimal due to an excessive volume of blood received in culture bottles Performed at [facility] [address] [place], [place] [address]    Culture   Final    NO GROWTH 1 DAY Performed at [facility], [place] [address] [place],  [address]    Report Status PENDING  Incomplete  MRSA PCR Screening     Status: Abnormal   Collection Time: 03/21/19  9:09 PM   Specimen: Nasopharyngeal  Result Value Ref Range Status   MRSA by PCR POSITIVE (A) NEGATIVE Final    Comment:        The GeneXpert MRSA Assay (FDA approved for NASAL specimens only), is one component of a comprehensive MRSA  colonization surveillance program. It is not intended to diagnose MRSA infection nor to guide or monitor treatment for MRSA infections. RESULT CALLED TO, READ BACK BY AND VERIFIED WITH: DAVIS,W RN  '@0154'$  ON 03/22/2019 JACKSON,K Performed at [facility], [place] [address], [place], [place] [address]   Culture, blood (routine x 2) Call MD if unable to obtain prior to antibiotics being given     Status: None (Preliminary result)   Collection Time: 03/21/19  9:57 PM   Specimen: BLOOD  Result Value Ref Range Status   Specimen Description   Final    BLOOD RIGHT ANTECUBITAL Performed at [facility] [address] [place], [place] [address]    Special Requests   Final    BOTTLES DRAWN AEROBIC AND ANAEROBIC Blood Culture adequate volume Performed at [facility] [address] [place], [place] [address]    Culture   Final    NO GROWTH 1 DAY Performed at [facility], [place] [address], [place], [place] [address]    Report Status PENDING  Incomplete    RN Pressure Injury Documentation and I am in Agreement with the RN's Documentation Pressure Injury 03/21/19 Coccyx Mid Stage II -  Partial thickness loss of dermis presenting as a shallow open ulcer with a red, pink wound bed without slough. (Active)  03/21/19 2130  Location: Coccyx  Location Orientation: Mid  Staging: Stage II -  Partial thickness loss of dermis presenting as a shallow open ulcer with a red, pink wound bed without slough.  Wound Description (Comments):   Present on Admission: Yes   Radiology Studies: Dg Chest Port 1 View  Result Date: 03/23/2019 CLINICAL DATA:  Shortness of breath EXAM: PORTABLE CHEST 1 VIEW COMPARISON:  March 21, 2019 FINDINGS: Opacity with volume loss and fibrosis in the left upper lobe persists, likely indicative of radiation therapy change. The ill-defined opacity in the left base remains stable. The right lung remains clear. Heart size is normal. The pulmonary vascularity on the right is normal. The pulmonary vascularity on the left remains somewhat distorted due to the apparent radiation therapy change. No adenopathy evident. No bone  lesions. IMPRESSION: Stable chest compared to recent prior study. Apparent radiation therapy change with volume loss and scarring left upper lobe. Ill-defined opacity left base, suspicious for pneumonia. No new opacity. Stable cardiac silhouette. Electronically Signed   By: Lowella Grip III M.D.   On: 03/23/2019 13:50   Scheduled Meds: . apixaban  5 mg Oral BID  . Chlorhexidine Gluconate Cloth  6 each Topical Daily  . dexamethasone (DECADRON) injection  4 mg Intravenous Q12H  . feeding supplement (ENSURE ENLIVE)  237 mL Oral BID BM  . feeding supplement (PRO-STAT SUGAR FREE 64)  30 mL Oral BID WC  . gabapentin  100 mg Oral TID  . levothyroxine  25 mcg Oral Q0600  . LORazepam  1 mg Intravenous Once  . mouth rinse  15 mL Mouth Rinse BID  . mupirocin ointment  1 application Nasal BID  . senna  1 tablet Oral BID   Continuous Infusions: . sodium chloride Stopped (03/23/19 0904)  . levofloxacin (LEVAQUIN) IV 750 mg (03/23/19 1733)  . sodium chloride      LOS: 2 days    Kerney Elbe, DO [facility] PAGER is on [place]  If 7PM-7AM, please contact night-coverage [email or web address] Password [facility] 03/23/2019, 5:41 PM

## 2019-03-23 NOTE — Evaluation (Addendum)
Occupational Therapy Evaluation Patient Details Name: Debra Hodges MRN: [id number] DOB: [birth year] Today's Date: 03/23/2019    History of Present Illness Chrisandra Wiemers is a 68 y.o. female with medical history significant of metastatic non-small cell lung cancer, metastatic to liver, brain on oral chemotherapy, hypertension, DVT since 2019, on Eliquis. Bed bound at baseline. Presented with tremors and right eye droopiness, increased nausea and vomiting   Clinical Impression   Pt admitted with above diagnoses, presenting with marked generalized weakness and decreased activity tolerance. PTA pt mostly in bed and total A for BADLs. She reports receiving HHOT for bed level exercise with working goal of tolerating sitting on side of bed. At time of eval, pt had difficulty flexing BUEs or active lifting them off of bed to facilitate independent BADL engagement. HR up to 114 with bed level activity. Due to pronounced weakness and decreased activity tolerance, further mobility deferred at this time to maintain safety (pt also deferring).  Pt requests to continue [place] and return home. This will remain appropriate as long as family continues to be willing and able to provide 24/7 total care, otherwise SNF will need to be considered. Will continue to follow acutely per POC listed below.    Follow Up Recommendations  Home health OT;Supervision/Assistance - 24 hour;Other (comment)(per pt she wants to continue [place] and return home. Continue to recommend home as long as family is willing and able to provide 24/7 total care assist)    Equipment Recommendations  Other (comment)(will continue to assess)    Recommendations for Other Services       Precautions / Restrictions Precautions Precautions: Fall Precaution Comments: mostly bed bound Restrictions Weight Bearing Restrictions: No      Mobility Bed Mobility Overal bed mobility: Needs Assistance             General bed mobility comments:  total A for all mobility  Transfers                 General transfer comment: NT    Balance                                           ADL either performed or assessed with clinical judgement   ADL                                         General ADL Comments: pt is total A for all ADLs. Is unable to lift arms to mouth for self feeding or grooming as well     Vision Baseline Vision/History: Wears glasses Wears Glasses: Reading only Patient Visual Report: Other (comment)(R eye droping/closing without pt control) Additional Comments: Pt reports vision as "pretty good". Able to see TV and read functionally in the room. R eye closes without control from pt. Will cont to assess- suspect from brain mets     Perception     Praxis      Pertinent Vitals/Pain Pain Assessment: Faces Faces Pain Scale: Hurts a little bit Pain Location: generalized with mobility Pain Descriptors / Indicators: Aching Pain Intervention(s): Monitored during session     Hand Dominance Right   Extremity/Trunk Assessment Upper Extremity Assessment Upper Extremity Assessment: Generalized weakness;RUE deficits/detail;LUE deficits/detail RUE Deficits / Details: grossly 3-/5. Only able to slightly lift off of bed  or flex elbow. Tremor present RUE Coordination: decreased fine motor;decreased gross motor LUE Deficits / Details: grossly 3-/5 LUE Coordination: decreased fine motor;decreased gross motor   Lower Extremity Assessment Lower Extremity Assessment: Generalized weakness;Defer to PT evaluation       Communication Communication Communication: No difficulties   Cognition Arousal/Alertness: Awake/alert Behavior During Therapy: WFL for tasks assessed/performed Overall Cognitive Status: Within Functional Limits for tasks assessed                                 General Comments: hx of main mets and radiation, pt is overall functional and  appropriate with tasks assessed. Alert and oriented   General Comments       Exercises     Shoulder Instructions      Home Living Family/patient expects to be discharged to:: Private residence Living Arrangements: Spouse/significant other;Children Available Help at Discharge: Family Type of Home: House Home Access: Ramped entrance     Anthem: One level     Bathroom Shower/Tub: Other (comment)(bathes bed level)             Additional Comments: pt reports bed that moves position but is not formal hospital bed (mostly bed bound)      Prior Functioning/Environment Level of Independence: Needs assistance    ADL's / Homemaking Assistance Needed: total assist for global BADL            OT Problem List: Decreased strength;Decreased knowledge of use of DME or AE;Decreased range of motion;Decreased activity tolerance;Impaired UE functional use;Impaired balance (sitting and/or standing);Pain      OT Treatment/Interventions: Self-care/ADL training;Therapeutic exercise;Balance training;Patient/family education;Energy conservation;Therapeutic activities;DME and/or AE instruction    OT Goals(Current goals can be found in the care plan section) Acute Rehab OT Goals Patient Stated Goal: to continue home therapy OT Goal Formulation: With patient Time For Goal Achievement: 04/06/19 Potential to Achieve Goals: Good  OT Frequency: Min 1X/week   Barriers to D/C:            Co-evaluation PT/OT/SLP Co-Evaluation/Treatment: Yes Reason for Co-Treatment: Complexity of the patient's impairments (multi-system involvement);For patient/therapist safety PT goals addressed during session: Mobility/safety with mobility;Strengthening/ROM OT goals addressed during session: ADL's and self-care;Strengthening/ROM      AM-PAC OT "6 Clicks" Daily Activity     Outcome Measure Help from another person eating meals?: Total Help from another person taking care of personal grooming?:  Total Help from another person toileting, which includes using toliet, bedpan, or urinal?: Total Help from another person bathing (including washing, rinsing, drying)?: Total Help from another person to put on and taking off regular upper body clothing?: Total Help from another person to put on and taking off regular lower body clothing?: Total 6 Click Score: 6   End of Session Nurse Communication: Mobility status  Activity Tolerance: Patient limited by fatigue Patient left: in bed;with call bell/phone within reach;with bed alarm set  OT Visit Diagnosis: Muscle weakness (generalized) (M62.81);Other symptoms and signs involving the nervous system (R29.898);Other abnormalities of gait and mobility (R26.89);Pain Pain - part of body: (generalized)                Time: 9518-8416 OT Time Calculation (min): 14 min Charges:  OT General Charges $OT Visit: 1 Visit OT Evaluation $OT Eval Moderate Complexity: [place], MSOT, OTR/L Behavioral Health OT/ Acute Relief OT [facility] Office: [phone]  Zenovia Jarred 03/23/2019, 12:46 PM

## 2019-03-23 NOTE — Progress Notes (Signed)
Pt refused bath this afternoon.

## 2019-03-23 NOTE — Progress Notes (Signed)
Xcover ? Hematuria Please monitor, if worsens may need to hold eliquis

## 2019-03-23 NOTE — Progress Notes (Signed)
Initial Nutrition Assessment  INTERVENTION:   -Ensure Enlive po BID, each supplement provides 350 kcal and 20 grams of protein -Prostat liquid protein PO 30 ml BID with meals, each supplement provides 100 kcal, 15 grams protein.  NUTRITION DIAGNOSIS:   Increased nutrient needs related to cancer and cancer related treatments as evidenced by estimated needs.  GOAL:   Patient will meet greater than or equal to 90% of their needs  MONITOR:   PO intake, Supplement acceptance, Labs, Weight trends, I & O's  REASON FOR ASSESSMENT:   Consult Assessment of nutrition requirement/status  ASSESSMENT:   68 y.o. female with medical history significant of metastatic non-small cell lung cancer, metastatic to liver, brain on oral chemotherapy. Admitted with Tremors and right eye droopiness, increased nausea and vomiting.  **RD working remotely**  Patient has had N/V PTA with poor appetite. Pt's husband has had to feed patient lately given bedbound status. Pt now with stage 2 pressure injury to coccyx. Pt would benefit from protein supplements, will order Ensure and Prostat.   Per weight records, pt has lost 26 lbs since 4/15 (13% wt loss x 6 months, significant for time frame).  I/Os: +3.6L since admit UOP: 475 ml x 24 hrs  Medications: K-Phos Labs reviewed: Low Phos Mg WNL  NUTRITION - FOCUSED PHYSICAL EXAM:  Unable to perform- working remotely.  Diet Order:   Diet Order            DIET SOFT Room service appropriate? Yes; Fluid consistency: Thin  Diet effective now              EDUCATION NEEDS:   No education needs have been identified at this time  Skin:  Skin Assessment: Reviewed RN Assessment  Last BM:  10/24 -type 6  Height:   Ht Readings from Last 1 Encounters:  03/21/19 5\' 6"  (1.676 m)    Weight:   Wt Readings from Last 1 Encounters:  03/21/19 74.8 kg    Ideal Body Weight:  59.1 kg  BMI:  Body mass index is 26.63 kg/m.  Estimated Nutritional  Needs:   Kcal:  2000-2200  Protein:  100-110g  Fluid:  2L/day  Clayton Bibles, MS, RD, LDN Inpatient Clinical Dietitian Pager: [phone] After Hours Pager: [phone]

## 2019-03-23 NOTE — Evaluation (Signed)
Physical Therapy Evaluation Patient Details Name: Debra Hodges MRN: [id number] DOB: [birth year] Today's Date: 03/23/2019   History of Present Illness  68 y.o. female with medical history significant of metastatic non-small cell lung cancer, metastatic to liver, brain on oral chemotherapy, hypertension, DVT. Bed bound at baseline. Presented with worsenting tremors and right eye droopiness, increased nausea and vomiting. Diagnosis: sepsis, pna.  Clinical Impression  Bed level eval only. Pt is mostly bedbound at baseline. She has been working with HHPT on bed mobility, sitting balance, and exercises. She would like PT to follow her during her hospital stay and she would like to resume HHPT once discharged. Will plan to follow 2x/week for now.     Follow Up Recommendations Home health PT;Supervision/Assistance - 24 hour(pt would like to resume HHPT)    Equipment Recommendations  None recommended by PT    Recommendations for Other Services       Precautions / Restrictions Precautions Precautions: Fall Precaution Comments: mostly bed bound Restrictions Weight Bearing Restrictions: No      Mobility  Bed Mobility Overal bed mobility: Needs Assistance             General bed mobility comments: total A for all mobility. Pt declined working on bed mobility on today.   Transfers                 General transfer comment: NT  Ambulation/Gait                Stairs            Wheelchair Mobility    Modified Rankin (Stroke Patients Only)       Balance                                             Pertinent Vitals/Pain Pain Assessment: Faces Faces Pain Scale: Hurts a little bit Pain Location: generalized with mobilization of UEs Pain Descriptors / Indicators: Grimacing Pain Intervention(s): Monitored during session    Home Living Family/patient expects to be discharged to:: Private residence Living Arrangements:  Spouse/significant other;Children Available Help at Discharge: Family Type of Home: House Home Access: Ramped entrance     Home Layout: One level Home Equipment: Environmental consultant - 2 wheels;Wheelchair - manual Additional Comments: has adjustable bed (not hospital bed)    Prior Function Level of Independence: Needs assistance   Gait / Transfers Assistance Needed: essentially bedbound per pt.  ADL's / Homemaking Assistance Needed: total assist for global BADL        Hand Dominance   Dominant Hand: Right    Extremity/Trunk Assessment   Upper Extremity Assessment Upper Extremity Assessment: Defer to OT evaluation RUE Deficits / Details: grossly 3-/5. Only able to slightly lift off of bed or flex elbow. Tremor present RUE Coordination: decreased fine motor;decreased gross motor LUE Deficits / Details: grossly 3-/5 LUE Coordination: decreased fine motor;decreased gross motor    Lower Extremity Assessment Lower Extremity Assessment: LLE deficits/detail;RLE deficits/detail RLE Deficits / Details: DF/PF 2/5, hip flexion 2/5. Can just barely DF to neutral with overpressure. Hip flex ~70-75 degrees. Some edema in foot. Tremors observed. RLE Sensation: decreased proprioception RLE Coordination: decreased gross motor;decreased fine motor LLE Deficits / Details: DF/PF 2/5, hip flexion 2/5. Can just barely DF to neutral with overpressure. Hip flex ~70-75 degrees. Some edema in foot. Tremors observed LLE Sensation: decreased proprioception LLE Coordination: decreased  gross motor;decreased fine motor       Communication   Communication: No difficulties  Cognition Arousal/Alertness: Awake/alert Behavior During Therapy: WFL for tasks assessed/performed Overall Cognitive Status: Within Functional Limits for tasks assessed                                 General Comments: hx of main mets and radiation, pt is overall functional and appropriate with tasks assessed. Alert and  oriented      General Comments      Exercises General Exercises - Lower Extremity Ankle Circles/Pumps: AAROM;Both;5 reps;Supine Heel Slides: AAROM;Both;5 reps;Supine   Assessment/Plan    PT Assessment Patient needs continued PT services  PT Problem List Decreased strength;Decreased mobility;Decreased range of motion;Decreased activity tolerance;Decreased balance;Pain;Decreased coordination       PT Treatment Interventions Therapeutic exercise;Therapeutic activities;Functional mobility training;Patient/family education    PT Goals (Current goals can be found in the Care Plan section)  Acute Rehab PT Goals Patient Stated Goal: to continue home therapy PT Goal Formulation: With patient Time For Goal Achievement: 04/06/19 Potential to Achieve Goals: Poor    Frequency Min 2X/week   Barriers to discharge        Co-evaluation   Reason for Co-Treatment: Complexity of the patient's impairments (multi-system involvement);For patient/therapist safety PT goals addressed during session: Mobility/safety with mobility;Strengthening/ROM OT goals addressed during session: ADL's and self-care;Strengthening/ROM       AM-PAC PT "6 Clicks" Mobility  Outcome Measure Help needed turning from your back to your side while in a flat bed without using bedrails?: Total Help needed moving from lying on your back to sitting on the side of a flat bed without using bedrails?: Total Help needed moving to and from a bed to a chair (including a wheelchair)?: Total Help needed standing up from a chair using your arms (e.g., wheelchair or bedside chair)?: Total Help needed to walk in hospital room?: Total Help needed climbing 3-5 steps with a railing? : Total 6 Click Score: 6    End of Session   Activity Tolerance: Patient limited by fatigue;Patient limited by pain Patient left: in bed;with call bell/phone within reach   PT Visit Diagnosis: Muscle weakness (generalized) (M62.81);Other symptoms and  signs involving the nervous system (R29.898)    Time: 3845-3646 PT Time Calculation (min) (ACUTE ONLY): 15 min   Charges:   PT Evaluation $PT Eval Moderate Complexity: [place], PT Acute Rehabilitation Services Pager: [phone] Office: [phone]

## 2019-03-24 ENCOUNTER — Inpatient Hospital Stay (HOSPITAL_COMMUNITY): Payer: Medicare Other

## 2019-03-24 DIAGNOSIS — C3490 Malignant neoplasm of unspecified part of unspecified bronchus or lung: Secondary | ICD-10-CM

## 2019-03-24 DIAGNOSIS — C7931 Secondary malignant neoplasm of brain: Secondary | ICD-10-CM | POA: Diagnosis not present

## 2019-03-24 LAB — IRON AND TIBC
Iron: 97 ug/dL (ref 28–170)
Saturation Ratios: 77 % — ABNORMAL HIGH (ref 10.4–31.8)
TIBC: 127 ug/dL — ABNORMAL LOW (ref 250–450)
UIBC: 30 ug/dL

## 2019-03-24 LAB — CBC WITH DIFFERENTIAL/PLATELET
Abs Immature Granulocytes: 0.03 10*3/uL (ref 0.00–0.07)
Basophils Absolute: 0 10*3/uL (ref 0.0–0.1)
Basophils Relative: 0 %
Eosinophils Absolute: 0 10*3/uL (ref 0.0–0.5)
Eosinophils Relative: 0 %
HCT: 27 % — ABNORMAL LOW (ref 36.0–46.0)
Hemoglobin: 8.9 g/dL — ABNORMAL LOW (ref 12.0–15.0)
Immature Granulocytes: 0 %
Lymphocytes Relative: 8 %
Lymphs Abs: 0.5 10*3/uL — ABNORMAL LOW (ref 0.7–4.0)
MCH: 30.1 pg (ref 26.0–34.0)
MCHC: 33 g/dL (ref 30.0–36.0)
MCV: 91.2 fL (ref 80.0–100.0)
Monocytes Absolute: 0.3 10*3/uL (ref 0.1–1.0)
Monocytes Relative: 5 %
Neutro Abs: 6 10*3/uL (ref 1.7–7.7)
Neutrophils Relative %: 87 %
Platelets: 129 10*3/uL — ABNORMAL LOW (ref 150–400)
RBC: 2.96 MIL/uL — ABNORMAL LOW (ref 3.87–5.11)
RDW: 16.4 % — ABNORMAL HIGH (ref 11.5–15.5)
WBC: 6.9 10*3/uL (ref 4.0–10.5)
nRBC: 0 % (ref 0.0–0.2)

## 2019-03-24 LAB — RETICULOCYTES
Immature Retic Fract: 20.2 % — ABNORMAL HIGH (ref 2.3–15.9)
RBC.: 2.96 MIL/uL — ABNORMAL LOW (ref 3.87–5.11)
Retic Count, Absolute: 63.3 10*3/uL (ref 19.0–186.0)
Retic Ct Pct: 2.1 % (ref 0.4–3.1)

## 2019-03-24 LAB — COMPREHENSIVE METABOLIC PANEL
ALT: 18 U/L (ref 0–44)
AST: 32 U/L (ref 15–41)
Albumin: 2.2 g/dL — ABNORMAL LOW (ref 3.5–5.0)
Alkaline Phosphatase: 101 U/L (ref 38–126)
Anion gap: 7 (ref 5–15)
BUN: 18 mg/dL (ref 8–23)
CO2: 23 mmol/L (ref 22–32)
Calcium: 8.1 mg/dL — ABNORMAL LOW (ref 8.9–10.3)
Chloride: 108 mmol/L (ref 98–111)
Creatinine, Ser: 0.5 mg/dL (ref 0.44–1.00)
GFR calc Af Amer: >60 mL/min (ref >60)
GFR calc non Af Amer: >60 mL/min (ref >60)
Glucose, Bld: 131 mg/dL — ABNORMAL HIGH (ref 70–99)
Potassium: 4.5 mmol/L (ref 3.5–5.1)
Sodium: 138 mmol/L (ref 135–145)
Total Bilirubin: 0.6 mg/dL (ref 0.3–1.2)
Total Protein: 4.8 g/dL — ABNORMAL LOW (ref 6.5–8.1)

## 2019-03-24 LAB — MAGNESIUM: Magnesium: 1.7 mg/dL (ref 1.7–2.4)

## 2019-03-24 LAB — URINE CULTURE: Culture: <10000 — AB

## 2019-03-24 LAB — FOLATE: Folate: 5.4 ng/mL — ABNORMAL LOW (ref >5.9)

## 2019-03-24 LAB — VITAMIN B12: Vitamin B-12: 2335 pg/mL — ABNORMAL HIGH (ref 180–914)

## 2019-03-24 LAB — FERRITIN: Ferritin: 837 ng/mL — ABNORMAL HIGH (ref 11–307)

## 2019-03-24 LAB — T4, FREE: Free T4: 1.75 ng/dL — ABNORMAL HIGH (ref 0.61–1.12)

## 2019-03-24 LAB — PHOSPHORUS: Phosphorus: 2.7 mg/dL (ref 2.5–4.6)

## 2019-03-24 MED ORDER — DEXAMETHASONE 4 MG PO TABS
2.0000 mg | ORAL_TABLET | Freq: Every day | ORAL | Status: DC
  Administered 2019-03-25 – 2019-03-29 (×5): 2 mg via ORAL
  Filled 2019-03-24 (×5): qty 1

## 2019-03-24 MED ORDER — LEVOTHYROXINE SODIUM 25 MCG PO TABS
12.5000 ug | ORAL_TABLET | Freq: Every day | ORAL | Status: DC
  Administered 2019-03-25 – 2019-03-29 (×5): 12.5 ug via ORAL
  Filled 2019-03-24 (×5): qty 1

## 2019-03-24 MED ORDER — LEVOFLOXACIN 750 MG PO TABS
750.0000 mg | ORAL_TABLET | ORAL | Status: DC
  Administered 2019-03-24 – 2019-03-25 (×2): 750 mg via ORAL
  Filled 2019-03-24 (×2): qty 1

## 2019-03-24 NOTE — Progress Notes (Signed)
Pharmacy Antibiotic Note  Debra Hodges is a 68 y.o. female with mets lung cancer currently on day #3 of levaquin for for suspected PNA.  Plan is to consult palliative care for goals of care.    - afeb, wbc wnl - scr stable (crcl>50) - all cultures have been negative thus far   Plan: - pt's taking pral meds. Will levaquin to 750 mg PO q24h.  - With stable renal function, pharmacy will sign off for abx consult.  Reconsult [place] if need further assistance.  _________________________________________________  Height: 5\' 6"  (167.6 cm) Weight: 165 lb (74.8 kg) IBW/kg (Calculated) : 59.3  Temp (24hrs), Avg:97.8 F (36.6 C), Min:97.6 F (36.4 C), Max:98.5 F (36.9 C)  Recent Labs  Lab 03/21/19 1359 03/21/19 1400 03/21/19 1406 03/21/19 2157 03/22/19 0149 03/23/19 0207 03/23/19 1115 03/24/19 0238  WBC 6.8  --   --   --  5.5 6.4 8.1 6.9  CREATININE 0.89  --  0.70  --  0.73 0.63  --  0.50  LATICACIDVEN  --  2.8*  --  1.8 1.7  --   --   --     Estimated Creatinine Clearance: 69.6 mL/min (by C-G formula based on SCr of 0.5 mg/dL).    Allergies  Allergen Reactions  . Peanut-Containing Drug Products     Excessive mucous  . Penicillins     Hives, Childhood Allergy Has patient had a PCN reaction causing immediate rash, facial/tongue/throat swelling, SOB or lightheadedness with hypotension: Yes Has patient had a PCN reaction causing severe rash involving mucus membranes or skin necrosis: No Has patient had a PCN reaction that required hospitalization: No Has patient had a PCN reaction occurring within the last 10 years: No If all of the above answers are "NO", then may proceed with Cephalosporin use.     Antimicrobials this admission: 10/23 levofloxacin >>   Microbiology results: 10/23 BCx x2:  10/23 Sputum: Sent  10/23 MRSA PCR: positive 10/25 ucx: insignif growth FINAL   Thank you for allowing pharmacy to be a part of this patient's care.  Lynelle Doctor 03/24/2019 11:11  AM

## 2019-03-24 NOTE — Progress Notes (Addendum)
Pt is refusing repositioning. Will continue to offer mobility.

## 2019-03-24 NOTE — Progress Notes (Signed)
HEMATOLOGY-ONCOLOGY PROGRESS NOTE  SUBJECTIVE: Debra Hodges has been readmitted due to worsening tremors, right eye droopiness, and increased nausea and vomiting.  Her Tagrisso was stopped prior to admission as she had not been seen in our office in several months.  The patient states that she has been too weak to leave her house.  She states that she is currently bedbound.  She thinks her tremors are slightly better this morning.  She denies chest pain or shortness of breath.  Denies abdominal pain, nausea, vomiting, constipation, diarrhea.  A repeat MRI of the brain with and without contrast was performed on admission which showed a new metastatic deposit in the posterior splenium corpus callosum, progression of metastatic disease in the right posterior parietal lobe, multiple additional treated lesion stable in the brain, progressive white matter changes likely due to prior treatment.  She has been started on dexamethasone. Chest x-ray concerning for left lower lobe pneumonia.  Oncology History Overview Note  Patient presented with sycopal episode to ED.  Work up showed LUL mass.  Malignant neoplasm of upper lobe of left lung   Primary site: Lung (Left)   Staging method: AJCC 7th Edition   Clinical: Stage IV (T1b, N3, M1b) signed by Curt Bears, MD on 11/15/2013 12:39 PM   Summary: Stage IV (T1b, N3, M1b)     Malignant neoplasm of upper lobe of left lung ([place])  10/27/2013 Pathology Results   Transbronchial biopsy LUL lung adenocarcinoma   10/27/2013 Surgery   Bronchoscopy   11/06/2013 Imaging   MRI Brain with and without contrast impression no acute or metastatic intracrainial abnoramlity.     11/13/2013 Initial Diagnosis   Malignant neoplasm of upper lobe of left lung   11/24/2013 Tumor Marker   Foundation One Genomic Alterations Identified EGFR amplification, [id number], [id number]*   Non-small cell carcinoma of lung, stage 4 ([facility])  04/07/2015 Initial Diagnosis   Non-small cell carcinoma of  lung, stage 4 ([place])   02/21/2018 -  Chemotherapy   The patient had palonosetron (ALOXI) injection 0.25 mg, 0.25 mg, Intravenous,  Once, 4 of 4 cycles Administration: 0.25 mg (02/21/2018), 0.25 mg (03/21/2018), 0.25 mg (04/11/2018), 0.25 mg (05/02/2018) pegfilgrastim (NEULASTA) injection 6 mg, 6 mg, Subcutaneous, Once, 4 of 4 cycles Administration: 6 mg (02/23/2018), 6 mg (03/23/2018), 6 mg (04/13/2018), 6 mg (05/04/2018) bevacizumab (AVASTIN) 1,500 mg in sodium chloride 0.9 % 100 mL chemo infusion, 15.3 mg/kg = 1,475 mg (100 % of original dose 15 mg/kg), Intravenous, Every 21 days, 10 of 14 cycles Dose modification: 15 mg/kg (original dose 15 mg/kg, Cycle 1) Administration: 1,500 mg (02/21/2018), 1,500 mg (03/21/2018), 1,500 mg (04/11/2018), 1,400 mg (05/02/2018), 1,400 mg (06/01/2018), 1,400 mg (06/20/2018), 1,400 mg (07/11/2018), 1,400 mg (07/31/2018), 1,400 mg (08/21/2018), 1,400 mg (09/11/2018) CARBOplatin (PARAPLATIN) 660 mg in sodium chloride 0.9 % 250 mL chemo infusion, 660 mg (100 % of original dose 655.2 mg), Intravenous,  Once, 4 of 4 cycles Dose modification: 655.2 mg (original dose 655.2 mg, Cycle 1), 655.2 mg (original dose 655.2 mg, Cycle 2), 655.2 mg (original dose 655.2 mg, Cycle 3), 540 mg (original dose 540 mg, Cycle 4) Administration: 660 mg (02/21/2018), 660 mg (03/21/2018), 660 mg (04/11/2018), 540 mg (05/02/2018) PACLitaxel (TAXOL) 426 mg in sodium chloride 0.9 % 500 mL chemo infusion (> '80mg'$ /m2), 200 mg/m2 = 426 mg, Intravenous,  Once, 4 of 4 cycles Dose modification: 150 mg/m2 (original dose 200 mg/m2, Cycle 4, Reason: Dose not tolerated) Administration: 426 mg (02/21/2018), 426 mg (03/21/2018), 426 mg (04/11/2018), 318 mg (  05/02/2018) fosaprepitant (EMEND) 150 mg, dexamethasone (DECADRON) 12 mg in sodium chloride 0.9 % 145 mL IVPB, , Intravenous,  Once, 4 of 4 cycles Administration:  (02/21/2018),  (03/21/2018),  (04/11/2018),  (05/02/2018) atezolizumab (TECENTRIQ) 1,200 mg in sodium chloride  0.9 % 250 mL chemo infusion, 1,200 mg, Intravenous, Once, 10 of 14 cycles Administration: 1,200 mg (02/21/2018), 1,200 mg (03/21/2018), 1,200 mg (04/11/2018), 1,200 mg (05/02/2018), 1,200 mg (06/01/2018), 1,200 mg (06/20/2018), 1,200 mg (07/11/2018), 1,200 mg (07/31/2018), 1,200 mg (08/21/2018), 1,200 mg (09/11/2018)  for chemotherapy treatment.       REVIEW OF SYSTEMS:   As noted in the HPI.  I have reviewed the past medical history, past surgical history, social history and family history with the patient and they are unchanged from previous note.   PHYSICAL EXAMINATION: ECOG PERFORMANCE STATUS: 4 - Bedbound  Vitals:   03/24/19 0600 03/24/19 0700  BP: (!) 95/55   Pulse: 89   Resp: (!) 24   Temp:  97.7 F (36.5 C)  SpO2: 98%    Filed Weights   03/21/19 1354  Weight: 165 lb (74.8 kg)    Intake/Output from previous day: 10/25 0701 - 10/26 0700 In: 1130.3 [P.O.:240; I.V.:341.9; IV Piggyback:548.5] Out: 1350 [Urine:1350]  GENERAL: Chronically ill-appearing female, no distress EYES: Has difficulty holding open her right eye. LUNGS: clear to auscultation and percussion with normal breathing effort HEART: Tachycardic, no murmurs, trace pedal edema ABDOMEN:abdomen soft, non-tender and normal bowel sounds Musculoskeletal: Strength 2/5 in all 4 extremities. NEURO: alert & oriented x 3 with fluent speech  LABORATORY DATA:  I have reviewed the data as listed CMP Latest Ref Rng & Units 03/24/2019 03/23/2019 03/22/2019  Glucose 70 - 99 mg/dL 131(H) 115(H) 84  BUN 8 - 23 mg/dL 18 24(H) 25(H)  Creatinine 0.44 - 1.00 mg/dL 0.50 0.63 0.73  Sodium 135 - 145 mmol/L 138 136 137  Potassium 3.5 - 5.1 mmol/L 4.5 4.0 3.3(L)  Chloride 98 - 111 mmol/L 108 106 101  CO2 22 - 32 mmol/L '23 23 23  '$ Calcium 8.9 - 10.3 mg/dL 8.1(L) 8.1(L) 8.4(L)  Total Protein 6.5 - 8.1 g/dL 4.8(L) 5.1(L) 5.9(L)  Total Bilirubin 0.3 - 1.2 mg/dL 0.6 1.0 1.5(H)  Alkaline Phos 38 - 126 U/L 101 112 115  AST 15 - 41 U/L 32  26 25  ALT 0 - 44 U/L '18 13 10    '$ Lab Results  Component Value Date   WBC 6.9 03/24/2019   HGB 8.9 (L) 03/24/2019   HCT 27.0 (L) 03/24/2019   MCV 91.2 03/24/2019   PLT 129 (L) 03/24/2019   NEUTROABS 6.0 03/24/2019    Mr Brain W And Wo Contrast  Result Date: 03/21/2019 CLINICAL DATA:  Metastatic lung cancer.  Post whole-brain radiation. EXAM: MRI HEAD WITHOUT AND WITH CONTRAST TECHNIQUE: Multiplanar, multiecho pulse sequences of the brain and surrounding structures were obtained without and with intravenous contrast. CONTRAST:  40m GADAVIST GADOBUTROL 1 MMOL/ML IV SOLN COMPARISON:  MRI head 11/18/2018 FINDINGS: Brain: Known metastatic disease to the brain. 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 Leptomeningeal enhancement right occipital lobe stable, axial image 66 5 mm enhancing nodule posterior splenium of corpus callosum is new 5 x 8 mm enhancing nodule right posterior parietal lobe has progressed in the interval. Axial image 97 Punctate enhancement right posterior frontal lobe axial image 113 unchanged. Punctate calcification left medial frontal lobe unchanged axial image 112 Leptomeningeal enhancement right frontal convexity  unchanged. Generalized atrophy. Mild ventricular enlargement. Mild white matter changes have progressed since the prior study. Progressive FLAIR hyperintensity right temporal lobe without restricted diffusion or enhancement. Possible radiation change. Negative for acute infarct Vascular: Normal arterial flow voids Skull and upper cervical spine: No focal skeletal lesions. Sinuses/Orbits: Mild mucosal edema paranasal sinuses.  Normal orbit Other: None IMPRESSION: New metastatic deposit in the posterior splenium corpus callosum. Progression of metastatic disease right posterior parietal lobe. Multiple additional treated lesions are stable in the brain. Progressive white matter changes.  Progressive FLAIR hyperintensity in the right temporal lobe. These findings are likely due to treatment. Electronically Signed   By: Franchot Gallo M.D.   On: 03/21/2019 15:54   Dg Chest Port 1 View  Result Date: 03/24/2019 CLINICAL DATA:  Shortness of breath.  Treated lung cancer EXAM: PORTABLE CHEST 1 VIEW COMPARISON:  Yesterday FINDINGS: Stable volume loss and perihilar opacity on the left attributed to fibrosis based on staging chest CT 12/12/2018. There is haziness of the left chest with obscured left diaphragm, possible pleural effusion. The right lung is clear. Normal heart size. IMPRESSION: 1. Stable compared to yesterday. 2. Hazy opacity at the left base that could be pleural fluid and/or infiltrate. 3. Left perihilar radiation fibrosis. Electronically Signed   By: Monte Fantasia M.D.   On: 03/24/2019 06:33   Dg Chest Port 1 View  Result Date: 03/23/2019 CLINICAL DATA:  Shortness of breath EXAM: PORTABLE CHEST 1 VIEW COMPARISON:  March 21, 2019 FINDINGS: Opacity with volume loss and fibrosis in the left upper lobe persists, likely indicative of radiation therapy change. The ill-defined opacity in the left base remains stable. The right lung remains clear. Heart size is normal. The pulmonary vascularity on the right is normal. The pulmonary vascularity on the left remains somewhat distorted due to the apparent radiation therapy change. No adenopathy evident. No bone lesions. IMPRESSION: Stable chest compared to recent prior study. Apparent radiation therapy change with volume loss and scarring left upper lobe. Ill-defined opacity left base, suspicious for pneumonia. No new opacity. Stable cardiac silhouette. Electronically Signed   By: Lowella Grip III M.D.   On: 03/23/2019 13:50   Dg Chest Portable 1 View  Result Date: 03/21/2019 CLINICAL DATA:  Weakness and tremor. History of stage IV lung carcinoma EXAM: PORTABLE CHEST 1 VIEW COMPARISON:  September 20, 2018 chest radiograph and chest CT  December 12, 2018 FINDINGS: There is volume loss and fibrosis in the left upper lobe, likely due to previous radiation therapy change. This appearance is stable. There is airspace opacity in the left base, concerning for pneumonia. Right lung is clear. Heart size is normal. Pulmonary vascular on the right is normal. There is distortion of pulmonary vascularity on the left due to the apparent post radiation therapy change. No adenopathy is appreciable by radiography. No bone lesions. IMPRESSION: 1.Apparent scarring and fibrosis in the left upper lobe medially with changes felt to be secondary to post radiation therapy. 2.  Airspace opacity consistent with pneumonia left base. 3.  Right lung clear. 4. Stable cardiac silhouette. No adenopathy appreciable by radiography. Electronically Signed   By: Lowella Grip III M.D.   On: 03/21/2019 15:57    ASSESSMENT AND PLAN: 1.  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  Pneumonia 3.  Tremors 4.  History of DVT 5.  Anemia 6.  Hematuria  -I discussed with the patient that her functional status has declined to the point that she is no  longer candidate for any additional chemotherapy.  We also discussed disease progression in her brain.  She has completed whole brain radiation already.  I reviewed the case with Dr. Tammi Klippel who advised against any additional radiation.  I recommended to the patient that she consider hospice care.  She states that she wants to think about this little bit longer.  She is agreeable to talking to the palliative care team for more detailed discussion of goals of care.  Agree with DNR/DNI. -Continue dexamethasone for now pending discussion of goals of care. -The patient's Eliquis is currently on hold secondary to hematuria.  May resume once hematuria resolves.  However, if the patient opts to go home with hospice, we will likely discontinue this. -Hemoglobin is stable.  No transfusion is indicated today.  Transfuse  for hemoglobin less than 8.  Plan reviewed with Dr. Julien Nordmann.   LOS: 3 days   Mikey Bussing, DNP, AGPCNP-BC, AOCNP 03/24/19

## 2019-03-24 NOTE — Progress Notes (Signed)
PROGRESS NOTE    Debra Hodges  LZJ:[id number] DOB: 08/27/1950 DOA: 03/21/2019 PCP: Patient, No Pcp Per   Brief Narrative:  HPI per Dr. Toy Baker on 03/21/2019 Debra Hodges is a 68 y.o. female with medical history significant of metastatic non-small cell lung cancer, metastatic to liver, brain on oral chemotherapy, hypertension, DVT since 2019, on Eliquis  bed bound  Presented with   Tremors and right eye droopiness, increased nausea and vomiting Tagrisso,her last dose was 4 days ago. Reports tremor has been going on for past few weeks but has progressively getting worse.  Usually involving her right leg and right arm Last time patient was admitted in July for dehydration in the setting of nausea and vomiting  Reports for the past week her right leg have been weeker she is unable to walk . Her oncologist recommended stopping her Chemo due to ongoing nausea and vomiting  She continues to take Eliquis  denies any recent bleeding  Infectious risk factors:  Reports dry cough, In  ER RAPID COVID TEST  in house testing  Pending  Recent Labs       Lab Results  Component Value Date   [place] 12/17/2018   [place] NEGATIVE 12/11/2018   [place] NEGATIVE 11/17/2018       Regarding pertinent Chronic problems:    Non-small cell lung cancer stage IV diagnosed since 2015 this post whole brain XRT  Hx of DVT on Eliquis  Hypertension takes spironolactone   Hypothyroidism:  Recent Labs       Lab Results  Component Value Date   TSH <0.010 (L) 12/12/2018     on synthroid     While in ER: Noted to have elevated lactic acid up to 2.8 Hypokalemia of potassium down to 3.2 The following Work up has been ordered so far: Worrisome for left lower lobe pneumonia MRI showing progression of her brain metastases Plan for patient to get Decadron She received first dose in emergency department and had vomiting  **Interim History Feels  the same as yesterday but nausea vomiting is improved.  SLP recommending soft diet.  We will continue IV fluid hydration for today and treatment.  Overnight had some questionable hematuria and urine today is tea colored.  Will need to continue to monitor and obtain urinalysis and urine culture and will need to discuss with neuro oncology about brain metastasis in a.m.  Assessment & Plan:   Active Problems:   Non-small cell carcinoma of lung, stage 4 (HCC)   Metastases to the liver [facility])   Hypothyroidism   Non-small cell lung cancer with metastasis (HCC)   Nausea and vomiting   Dehydration   Hypokalemia   Brain metastasis (HCC)   Sepsis ([place])  Brain metastasis ([place]) -Showed a new metastatic deposit in the posterior splenium corpus callosum and had progression of metastatic disease in the right posterior parietal lobe  -initiate Decadron 4 mg q12h  -goal is for patient able to tolerate p.o. switch from IV; -Appreciate oncology input and have Notified Dr. Mickeal Skinner about patient regarding his Opinion. Medical Oncology reviewed th case with Dr. Tammi Klippel who recommended against any additional Radiation -Dr. Mickeal Skinner reviewed her case and he feels that the new brain metastasis and progressive disease are not causing her symptoms and recommended stopping dexamethasone and transitioning to 2 mg p.o. daily -Medical oncology currently recommending hospice care but patient is not currently agreeable but is agreeable to speak with palliative care for more detailed discussion of the Goals of Care -  Appreciate palliative evaluation  Non-small cell carcinoma of lung, stage 4 (HCC)  -Oncology is aware and I have added Dr. Earlie Server to the care team and will appreciate further evaluation recommendations -Medical oncology evaluated and feels that because of her functional status is declined to a point that she is no longer a candidate for any additional chemotherapy -Dexamethasone has been transitioned to p.o.  daily -Appreciate Palliative Consultation and goals of care discussion  Metastases to the liver ([place])  -Continue to monitor LFT's and T Bili; last AST and ALT were 32 and 18 respectively and T bili was 0.6 -Normalized now.  -Repeat CMP in a.m.  Sepsis from Pneumonia, improving -SIRS criteria met with tachycardia, elevated LA   -With evidence of end organ damage such as  elevated lactic acid    -Most likely source being: pulmonary   -Obtain serial lactic acid and procalcitonin level.  -Initiate IV antibiotics and C/w IV Levofloxacin for CAP and will continue  -Await results of blood and urine culture  -Rehydrated with normal saline rate of 75 mL's per hour and now stopped -CXR showed "Stable compared to yesterday. Hazy opacity at the left base that could be pleural fluid and/or infiltrate. Left perihilar radiation fibrosis." -Currently not in any respiratory distress -Repeat CXR in AM   Hypothyroidism  -Checked TSH  And was <0.010 and will checked Free T4 and was 1.75 -Takes Levothyroxine at 25 mcg po Daily and may need adjustmentsand will decrease by half to 12.5 mcg po Daily    Intractable Nausea and vomiting, improving    -as noted in dehydration was originally thought to be from Brain metastases but Dr. Mickeal Skinner feels that it is not related to the Mets -C/w Supportive Care  and Antiemetics; IVF now stopped  -SLP evaluated and recommending Soft Diet if tolerated   Dehydration  -hydrate and follow fluid status  and resume fluids at 75 mL's per hour yesterday but now stopped.   Hypophosphatemia -Patient is phosphorus level was 2.7 -Continue to Monitor and Replete as Necessary -Repeat Phos Level in AM   Hypokalemia -Patient's potassium is now 4.5 -Continue to monitor replete as necessary -Repeat CMP in a.m.  Poor Nutritional status  -Checked prealbumin and was 7.9 -Nutritionist consulted for further evaluation recommendations and recommending Ensure Enlive p.o. twice  daily as well as prostat liquid p.o. 30 mL twice daily  Hx of DVT  -Holding Eliquis given her recent hematuria and may resume in AM as Hematuria is improving -Pt is at high risk for clotting given non-ambulatory status but may stop Eliquis pending GOC Conversation   Hyperbilirubinemia -Likely in the setting of Liver Metastasis -T bili went from 1.5 is now 0.6 -Continue to Monitor and Trend -Repeat CMP in AM   Normocytic Anemia -Patient's Hb/Hct went from 11.7/36.9 -> 10.2/32.5 -> 8.8/28.3 -> 9.2/28.0 -> 8.9/27.0 -Checked Anemia Panel and showed an iron level of 97, U IBC of 30, TIBC 127, saturation ratios of 77%, ferritin of 837, folate of 5.4, and vitamin B12 level 2295 -Continue to Monitor for S/Sx of Bleeding; Currently no overt bleeding noted  -Repeat CBC in AM   Questionable Hematuria and possible UTI, POA -Overnight there is report of some hematuria but urine was tea colored per nursing this morning -Urinalysis showed a hazy appearance with large hemoglobin, 20 ketones, no bacteria seen, but greater than 50 RBCs per high-power field -Urinalysis showed insignificant growth -We will continue Levofloxacin -We will continue IV fluid hydration with normal saline as above -  Hold Eliquis currently for now and reevaluate -May need urology assistance but her urine is starting to clear  Thrombocytopenia -Patient platelet count now 129,000 -Had some questionable hematuria overnight and her urine in the pubic is tea colored -Urinalysis showed some large hemoglobin and RBCs per high-power field Plan continue to monitor and may need urological assistance and will hold Eliquis for today -Repeat CBC in the a.m. and continue to monitor for signs and symptoms of bleeding  DVT prophylaxis: Anticoagulated with Eliquis Code Status: DO NOT RESUSCITATE  Family Communication: No family present at bedside  Disposition Plan: Pending further improvement and workup and pending goals of care discussion  with palliative care medicine  Consultants:   Oncology  Neuro-oncology  Palliative care  Procedures: None  Antimicrobials:  Anti-infectives (From admission, onward)   Start     Dose/Rate Route Frequency Ordered Stop   03/24/19 1600  levofloxacin (LEVAQUIN) tablet 750 mg     750 mg Oral Every 24 hours 03/24/19 1116     03/22/19 1600  levofloxacin (LEVAQUIN) IVPB 750 mg  Status:  Discontinued     750 mg 100 mL/hr over 90 Minutes Intravenous Every 24 hours 03/21/19 1928 03/24/19 1116   03/21/19 1615  levofloxacin (LEVAQUIN) IVPB 750 mg     750 mg 100 mL/hr over 90 Minutes Intravenous  Once 03/21/19 1610 03/21/19 1822     Subjective: Seen and examined at bedside   Objective: Vitals:   03/24/19 0900 03/24/19 1000 03/24/19 1100 03/24/19 1200  BP: 113/82 (!) 128/48 (!) 103/46 111/69  Pulse: 94 (!) 112 (!) 106 (!) 107  Resp: (!) 26 (!) 26 (!) 26 (!) 28  Temp:      TempSrc:      SpO2: 98% 97% 97% 97%  Weight:      Height:        Intake/Output Summary (Last 24 hours) at 03/24/2019 1526 Last data filed at 03/24/2019 1331 Gross per 24 hour  Intake 334.91 ml  Output 1450 ml  Net -1115.09 ml   Filed Weights   03/21/19 1354  Weight: 74.8 kg   Examination: Physical Exam:  Constitutional: Chronically ill-appearing overweight Caucasian female currently in NAD and appears calm but slightly uncomfortable Eyes: Lids and conjunctivae normal, sclerae anicteric; right eye is twitching ENMT: External Ears, Nose appear normal. Grossly normal hearing.  Neck: Appears normal, supple, no cervical masses, normal ROM, no appreciable thyromegaly; no appreciable JVD Respiratory: Diminished to auscultation bilaterally, no wheezing, rales, rhonchi or crackles. Normal respiratory effort and patient is not tachypenic. No accessory muscle use.  Unlabored breathing Cardiovascular: Tachycardic rate but regular rhythm, no murmurs / rubs / gallops. S1 and S2 auscultated.  Slight lower extremity  edema likely secondary to poor nutritional status Abdomen: Soft, non-tender, Distended. Bowel sounds positive x4.  GU: Deferred. Musculoskeletal: No clubbing / cyanosis of digits/nails. No joint deformity upper and lower extremities. Skin: No rashes, lesions, ulcers on limited skin evaluation. No induration; Warm and dry.  Neurologic: CN 2-12 grossly intact with no focal deficits. Romberg sign and cerebellar reflexes not assessed.  Does have a tremor on her right arm Psychiatric: Normal judgment and insight. Alert and oriented x 3.  Somewhat depressed appearing mood and slight flat affect.   Data Reviewed: I have personally reviewed following labs and imaging studies  CBC: Recent Labs  Lab 03/21/19 1359 03/22/19 0149 03/23/19 0207 03/23/19 1115 03/24/19 0238  WBC 6.8 5.5 6.4 8.1 6.9  NEUTROABS 4.6  --   --   --  6.0  HGB 11.7* 10.2* 8.8* 9.2* 8.9*  HCT 36.9 32.5* 28.3* 28.0* 27.0*  MCV 93.2 93.9 94.3 91.8 91.2  PLT 186 143* 128* 141* 981*   Basic Metabolic Panel: Recent Labs  Lab 03/21/19 1359 03/21/19 1406 03/21/19 2157 03/22/19 0149 03/23/19 0207 03/24/19 0238  NA 138  --   --  137 136 138  K 3.2*  --   --  3.3* 4.0 4.5  CL 98  --   --  101 106 108  CO2 24  --   --  '23 23 23  '$ GLUCOSE 99  --   --  84 115* 131*  BUN 28*  --   --  25* 24* 18  CREATININE 0.89 0.70  --  0.73 0.63 0.50  CALCIUM 9.1  --   --  8.4* 8.1* 8.1*  MG  --   --  1.7 1.9 1.7 1.7  PHOS  --   --  2.8 3.0 2.1* 2.7   GFR: Estimated Creatinine Clearance: 69.6 mL/min (by C-G formula based on SCr of 0.5 mg/dL). Liver Function Tests: Recent Labs  Lab 03/21/19 1359 03/22/19 0149 03/23/19 0207 03/24/19 0238  AST '30 25 26 '$ 32  ALT '13 10 13 18  '$ ALKPHOS 153* 115 112 101  BILITOT 1.5* 1.5* 1.0 0.6  PROT 6.9 5.9* 5.1* 4.8*  ALBUMIN 3.0* 2.7* 2.4* 2.2*   Recent Labs  Lab 03/21/19 1359  LIPASE 28   No results for input(s): AMMONIA in the last 168 hours. Coagulation Profile: Recent Labs  Lab  03/21/19 2157  INR 1.7*   Cardiac Enzymes: Recent Labs  Lab 03/21/19 2157  CKTOTAL 27*   BNP (last 3 results) No results for input(s): PROBNP in the last 8760 hours. HbA1C: No results for input(s): HGBA1C in the last 72 hours. CBG: No results for input(s): GLUCAP in the last 168 hours. Lipid Profile: No results for input(s): CHOL, HDL, LDLCALC, TRIG, CHOLHDL, LDLDIRECT in the last 72 hours. Thyroid Function Tests: Recent Labs    03/22/19 0149 03/23/19 1812  TSH <0.010*  --   FREET4  --  1.75*   Anemia Panel: Recent Labs    03/24/19 0238  VITAMINB12 2,335*  FOLATE 5.4*  FERRITIN 837*  TIBC 127*  IRON 97  RETICCTPCT 2.1   Sepsis Labs: Recent Labs  Lab 03/21/19 1400 03/21/19 2157 03/22/19 0149  PROCALCITON  --  <0.10  --   LATICACIDVEN 2.8* 1.8 1.7    Recent Results (from the past 240 hour(s))  SARS CORONAVIRUS 2 (TAT 6-24 HRS) Nasopharyngeal Nasopharyngeal Swab     Status: None   Collection Time: 03/21/19  4:47 PM   Specimen: Nasopharyngeal Swab  Result Value Ref Range Status   SARS Coronavirus 2 NEGATIVE NEGATIVE Final    Comment: (NOTE) SARS-CoV-2 target nucleic acids are NOT DETECTED. The SARS-CoV-2 RNA is generally detectable in upper and lower respiratory specimens during the acute phase of infection. Negative results do not preclude SARS-CoV-2 infection, do not rule out co-infections with other pathogens, and should not be used as the sole basis for treatment or other patient management decisions. Negative results must be combined with clinical observations, patient history, and epidemiological information. The expected result is Negative. Fact Sheet for Patients: SugarRoll.be Fact Sheet for Healthcare Providers: https://www.woods-mathews.com/ This test is not yet approved or cleared by the [place] FDA and  has been authorized for detection and/or diagnosis of SARS-CoV-2 by FDA under an Emergency  Use Authorization (EUA). This EUA will remain  in effect (meaning this test can be used) for the duration of the COVID-19 declaration under Section 56 4(b)(1) of the Act, 21 U.S.C. section 360bbb-3(b)(1), unless the authorization is terminated or revoked sooner. Performed at [facility], [place] [address] [place], [place] [address]   Culture, blood (routine x 2) Call MD if unable to obtain prior to antibiotics being given     Status: None (Preliminary result)   Collection Time: 03/21/19  7:45 PM   Specimen: BLOOD  Result Value Ref Range Status   Specimen Description   Final    BLOOD RIGHT ANTECUBITAL Performed at [facility] [address], [place], [place] [address]    Special Requests   Final    BOTTLES DRAWN AEROBIC AND ANAEROBIC Blood Culture results may not be optimal due to an excessive volume of blood received in culture bottles Performed at [facility] [address] [place], [place] [address]    Culture   Final    NO GROWTH 2 DAYS Performed at [facility] [address], [place], [place] [address]    Report Status PENDING  Incomplete  MRSA PCR Screening     Status: Abnormal   Collection Time: 03/21/19  9:09 PM   Specimen: Nasopharyngeal  Result Value Ref Range Status   MRSA by PCR POSITIVE (A) NEGATIVE Final    Comment:        The GeneXpert MRSA Assay (FDA approved for NASAL specimens only), is one component of a comprehensive MRSA colonization surveillance program. It is not intended to diagnose MRSA infection nor to guide or monitor treatment for MRSA infections. RESULT CALLED TO, READ BACK BY AND VERIFIED WITH: DAVIS,W RN '@[id number]'$  ON 03/22/2019 JACKSON,K Performed at [facility], [place] [address], [place], [place] [address]   Culture, blood (routine x 2) Call MD if unable to obtain prior to antibiotics being given     Status: None (Preliminary result)   Collection Time: 03/21/19  9:57 PM    Specimen: BLOOD  Result Value Ref Range Status   Specimen Description   Final    BLOOD RIGHT ANTECUBITAL Performed at [facility] [address] [place], [place] [address]    Special Requests   Final    BOTTLES DRAWN AEROBIC AND ANAEROBIC Blood Culture adequate volume Performed at [facility] [address] [place], [place] [address]    Culture   Final    NO GROWTH 2 DAYS Performed at [facility] [address], [place], [place] [address]    Report Status PENDING  Incomplete  Culture, Urine     Status: Abnormal   Collection Time: 03/23/19  8:00 AM   Specimen: Urine, Random  Result Value Ref Range Status   Specimen Description   Final    URINE, RANDOM Performed at [facility] [address] [place], [place] [address]    Special Requests   Final    NONE Performed at [facility], [place] [address] [place], [place] [address]    Culture (A)  Final    <10,000 COLONIES/mL INSIGNIFICANT GROWTH Performed at [facility] [address] [place], [place] [address]    Report Status 03/24/2019 FINAL  Final    RN Pressure Injury Documentation and I am in Agreement with the RN's Documentation Pressure Injury 03/21/19 Coccyx Mid Stage II -  Partial thickness loss of dermis presenting as a shallow open  ulcer with a red, pink wound bed without slough. (Active)  03/21/19 2130  Location: Coccyx  Location Orientation: Mid  Staging: Stage II -  Partial thickness loss of dermis presenting as a shallow open ulcer with a red, pink wound bed without slough.  Wound Description (Comments):   Present on Admission: Yes   Radiology Studies: Dg Chest Port 1 View  Result Date: 03/24/2019 CLINICAL DATA:  Shortness of breath.  Treated lung cancer EXAM: PORTABLE CHEST 1 VIEW COMPARISON:  Yesterday FINDINGS: Stable volume loss and perihilar opacity on the left attributed to fibrosis based on staging chest CT  12/12/2018. There is haziness of the left chest with obscured left diaphragm, possible pleural effusion. The right lung is clear. Normal heart size. IMPRESSION: 1. Stable compared to yesterday. 2. Hazy opacity at the left base that could be pleural fluid and/or infiltrate. 3. Left perihilar radiation fibrosis. Electronically Signed   By: Monte Fantasia M.D.   On: 03/24/2019 06:33   Dg Chest Port 1 View  Result Date: 03/23/2019 CLINICAL DATA:  Shortness of breath EXAM: PORTABLE CHEST 1 VIEW COMPARISON:  March 21, 2019 FINDINGS: Opacity with volume loss and fibrosis in the left upper lobe persists, likely indicative of radiation therapy change. The ill-defined opacity in the left base remains stable. The right lung remains clear. Heart size is normal. The pulmonary vascularity on the right is normal. The pulmonary vascularity on the left remains somewhat distorted due to the apparent radiation therapy change. No adenopathy evident. No bone lesions. IMPRESSION: Stable chest compared to recent prior study. Apparent radiation therapy change with volume loss and scarring left upper lobe. Ill-defined opacity left base, suspicious for pneumonia. No new opacity. Stable cardiac silhouette. Electronically Signed   By: Lowella Grip III M.D.   On: 03/23/2019 13:50   Scheduled Meds: . apixaban  5 mg Oral BID  . Chlorhexidine Gluconate Cloth  6 each Topical Daily  . dexamethasone (DECADRON) injection  4 mg Intravenous Q12H  . feeding supplement (ENSURE ENLIVE)  237 mL Oral BID BM  . feeding supplement (PRO-STAT SUGAR FREE 64)  30 mL Oral BID WC  . gabapentin  100 mg Oral TID  . levofloxacin  750 mg Oral Q24H  . levothyroxine  25 mcg Oral Q0600  . LORazepam  1 mg Intravenous Once  . mouth rinse  15 mL Mouth Rinse BID  . mupirocin ointment  1 application Nasal BID  . senna  1 tablet Oral BID   Continuous Infusions: . sodium chloride      LOS: 3 days    Kerney Elbe, DO [facility]  PAGER is on AMION  If 7PM-7AM, please contact night-coverage [email or web address] Password [email or web address] 03/24/2019, 3:26 PM

## 2019-03-24 NOTE — Progress Notes (Signed)
  Speech Language Pathology Treatment: Dysphagia  Patient Details Name: Debra Hodges MRN: [id number] DOB: [birth year] Today's Date: 03/24/2019 Time: 3646-8032 SLP Time Calculation (min) (ACUTE ONLY): 20 min  Assessment / Plan / Recommendation Clinical Impression  Pt awake in bed with spouse at bedside.  Meal arrived for pt and SlP offered to help her consume dinner, she declined.  Upon examination of oral cavity, pt xerostomic with viscous secretions. SLP brushed pt's teeth and gums using oral suction and toothbrush with her permission.  Pt demonstrated adequate ability to swish and expectorate water indicating adequate oral control.  Weak cough x1 noted after HOB minimally lowered = voice is largely strong.  Pt states her mucositis (mouth sores) are better than before and she has improvement with nausea and vomiting.      Educated pt and spouse to importance of oral care for pulmonary health and posted signs above the bed regarding swallowing precautions. Demonstrated use of oral suction to spouse and instructed only to suction at anterior oral cavity and call nurse if concerns are present.   Reviewed pt's need to be upright as much as able for po intake as she reports discomfort from sacral wound.   Posted signs with education and recommendations.  SLP will follow up for tolerance, education and treatment. Concern for adequacy of nutrition given poor po intake.      HPI HPI: 68 y.o. female with medical history significant of metastatic non-small cell lung cancer, metastatic to liver, brain on oral chemotherapy, hypertension, DVT since 2019, on Eliquis.  Pt underwent clinical swallow evaluation on Saturday 10/24 and follow up indicated today.      SLP Plan  Continue with current plan of care       Recommendations  Diet recommendations: Dysphagia 3 (mechanical soft);Thin liquid Liquids provided via: Straw(pt prefers straw) Medication Administration: Whole meds with liquid Supervision:  Full supervision/cueing for compensatory strategies;Staff to assist with self feeding(pt needs to be fed by staff due to gross debility) Compensations: Slow rate;Small sips/bites(oral suction after meals) Postural Changes and/or Swallow Maneuvers: Seated upright 90 degrees;Upright 30-60 min after meal                Oral Care Recommendations: Oral care BID(oral suction after meals, educated spouse to use of oral suction) Follow up Recommendations: None SLP Visit Diagnosis: Dysphagia, unspecified (R13.10) Plan: Continue with current plan of care       GO                Macario Golds 03/24/2019, 5:24 PM   Luanna Salk, [facility] SLP [place] Pager [phone] Office [phone]

## 2019-03-25 DIAGNOSIS — I959 Hypotension, unspecified: Secondary | ICD-10-CM

## 2019-03-25 DIAGNOSIS — Z515 Encounter for palliative care: Secondary | ICD-10-CM

## 2019-03-25 DIAGNOSIS — Z7189 Other specified counseling: Secondary | ICD-10-CM

## 2019-03-25 DIAGNOSIS — L8990 Pressure ulcer of unspecified site, unspecified stage: Secondary | ICD-10-CM | POA: Diagnosis present

## 2019-03-25 LAB — CBC WITH DIFFERENTIAL/PLATELET
Abs Immature Granulocytes: 0.08 10*3/uL — ABNORMAL HIGH (ref 0.00–0.07)
Basophils Absolute: 0 10*3/uL (ref 0.0–0.1)
Basophils Relative: 0 %
Eosinophils Absolute: 0 10*3/uL (ref 0.0–0.5)
Eosinophils Relative: 0 %
HCT: 29.3 % — ABNORMAL LOW (ref 36.0–46.0)
Hemoglobin: 9.4 g/dL — ABNORMAL LOW (ref 12.0–15.0)
Immature Granulocytes: 1 %
Lymphocytes Relative: 12 %
Lymphs Abs: 1.1 10*3/uL (ref 0.7–4.0)
MCH: 29.3 pg (ref 26.0–34.0)
MCHC: 32.1 g/dL (ref 30.0–36.0)
MCV: 91.3 fL (ref 80.0–100.0)
Monocytes Absolute: 1 10*3/uL (ref 0.1–1.0)
Monocytes Relative: 10 %
Neutro Abs: 7.5 10*3/uL (ref 1.7–7.7)
Neutrophils Relative %: 77 %
Platelets: 145 10*3/uL — ABNORMAL LOW (ref 150–400)
RBC: 3.21 MIL/uL — ABNORMAL LOW (ref 3.87–5.11)
RDW: 16.4 % — ABNORMAL HIGH (ref 11.5–15.5)
WBC: 9.7 10*3/uL (ref 4.0–10.5)
nRBC: 0 % (ref 0.0–0.2)

## 2019-03-25 LAB — COMPREHENSIVE METABOLIC PANEL
ALT: 27 U/L (ref 0–44)
AST: 40 U/L (ref 15–41)
Albumin: 2.2 g/dL — ABNORMAL LOW (ref 3.5–5.0)
Alkaline Phosphatase: 96 U/L (ref 38–126)
Anion gap: 8 (ref 5–15)
BUN: 23 mg/dL (ref 8–23)
CO2: 23 mmol/L (ref 22–32)
Calcium: 8.2 mg/dL — ABNORMAL LOW (ref 8.9–10.3)
Chloride: 108 mmol/L (ref 98–111)
Creatinine, Ser: 0.5 mg/dL (ref 0.44–1.00)
GFR calc Af Amer: >60 mL/min (ref >60)
GFR calc non Af Amer: >60 mL/min (ref >60)
Glucose, Bld: 117 mg/dL — ABNORMAL HIGH (ref 70–99)
Potassium: 3.9 mmol/L (ref 3.5–5.1)
Sodium: 139 mmol/L (ref 135–145)
Total Bilirubin: 0.6 mg/dL (ref 0.3–1.2)
Total Protein: 4.7 g/dL — ABNORMAL LOW (ref 6.5–8.1)

## 2019-03-25 LAB — MAGNESIUM: Magnesium: 1.6 mg/dL — ABNORMAL LOW (ref 1.7–2.4)

## 2019-03-25 LAB — PHOSPHORUS: Phosphorus: 1.7 mg/dL — ABNORMAL LOW (ref 2.5–4.6)

## 2019-03-25 LAB — STREP PNEUMONIAE URINARY ANTIGEN: Strep Pneumo Urinary Antigen: NEGATIVE

## 2019-03-25 MED ORDER — SODIUM CHLORIDE 0.9 % IV SOLN
INTRAVENOUS | Status: DC | PRN
  Administered 2019-03-25: 1000 mL via INTRAVENOUS

## 2019-03-25 MED ORDER — SODIUM CHLORIDE 0.9 % IV BOLUS
1000.0000 mL | Freq: Once | INTRAVENOUS | Status: AC
  Administered 2019-03-25: 12:00:00 1000 mL via INTRAVENOUS

## 2019-03-25 MED ORDER — MAGNESIUM SULFATE 2 GM/50ML IV SOLN
2.0000 g | Freq: Once | INTRAVENOUS | Status: AC
  Administered 2019-03-25: 2 g via INTRAVENOUS
  Filled 2019-03-25: qty 50

## 2019-03-25 MED ORDER — POTASSIUM PHOSPHATES 15 MMOLE/5ML IV SOLN
30.0000 mmol | Freq: Once | INTRAVENOUS | Status: AC
  Administered 2019-03-25: 06:00:00 30 mmol via INTRAVENOUS
  Filled 2019-03-25: qty 10

## 2019-03-25 MED ORDER — SODIUM CHLORIDE 0.9 % IV SOLN
INTRAVENOUS | Status: DC
  Administered 2019-03-25: 75 mL/h via INTRAVENOUS
  Administered 2019-03-26 – 2019-03-28 (×5): via INTRAVENOUS

## 2019-03-25 NOTE — Progress Notes (Signed)
AM labs showed Mag 1.6, Phos 1.7, and Ca 8.2. RN notified Dr. Baltazar Najjar. New orders placed to give magnesium and potassium phosphate per IVPB. Will continue to monitor and assess pt.

## 2019-03-25 NOTE — Consult Note (Signed)
Consultation Note Date: 03/25/2019   Patient Name: Debra Hodges  DOB: [birth year]  MRN: [id number]  Age / Sex: 68 y.o., female  PCP: Patient, No Pcp Per Referring Physician: Kerney Elbe, DO  Reason for Consultation: Establishing goals of care  HPI/Patient Profile: 68 y.o. female      admitted on 03/21/2019    Clinical Assessment and Goals of Care:  68 yo lady with stage IV NSCLC with leptomeningeal disease, PNA, tremors s/p whole brain radiation. Admitted with sepsis PNA, found to have new brain metastases.   A palliative consult has been requested for ongoing goals of care discussions.   The patient is resting in bed. I introduced myself and palliative care as follows: Palliative medicine is specialized medical care for people living with serious illness. It focuses on providing relief from the symptoms and stress of a serious illness. The goal is to improve quality of life for both the patient and the family.  Goals of care: Broad aims of medical therapy in relation to the patient's values and preferences. Our aim is to provide medical care aimed at enabling patients to achieve the goals that matter most to them, given the circumstances of their particular medical situation and their constraints.    The patient recalls the information she has received from her oncologist. She is aware of current findings of new brain mets and progressive disease. She states that she has had cancer for a few years now. "I'm tired and they don't have anything more to cure this with."  We explored gently about hospice philosophy of care. Different levels of hospice support also discussed. Patient lives at home with husband and daughter. She appears quite debilitated. She has escalating symptom burden with increased secretions and shortness of breath. We discussed about residential hospice. Patient became tearful  towards the end of my visit. See below.   NEXT OF KIN  husband.  Patient lives in [place], [place] with husband and daughter.   SUMMARY OF RECOMMENDATIONS    Agree with DNR Discussed in detail with patient about home with hospice versus residential hospice ( hospice of [facility]), she will discuss with her husband, PMT to follow up on 03-26-2019.   Code Status/Advance Care Planning:  DNR    Symptom Management:    med list noted, continue current Symptom management measures.   Palliative Prophylaxis:   Delirium Protocol   Psycho-social/Spiritual:   Desire for further Chaplaincy support:yes  Additional Recommendations: Education on Hospice  Prognosis:   < 2 weeks  Discharge Planning: To Be Determined      Primary Diagnoses: Present on Admission:  Brain metastasis ([place])  Non-small cell carcinoma of lung, stage 4 (HCC)  Metastases to the liver [facility])  Hypothyroidism  Non-small cell lung cancer with metastasis ([place])  Nausea and vomiting  Dehydration  Hypokalemia  Sepsis ([place])   I have reviewed the medical record, interviewed the patient and family, and examined the patient. The following aspects are pertinent.  Past Medical History:  Diagnosis Date  High blood pressure    Liver lesion 06/26/2016   Non-small cell carcinoma of lung, stage 4 (HCC) dx'd 10/2013   Social History   Socioeconomic History   Marital status: Married    Spouse name: Not on file   Number of children: Not on file   Years of education: Not on file   Highest education level: Not on file  Occupational History   Occupation: Optometrist  Social Needs   Financial resource strain: Not on file   Food insecurity    Worry: Not on file    Inability: Not on file   Transportation needs    Medical: Not on file    Non-medical: Not on file  Tobacco Use   Smoking status: Never Smoker   Smokeless tobacco: Never Used  Substance and Sexual Activity   Alcohol use: No     Comment: social   Drug use: No   Sexual activity: Not Currently  Lifestyle   Physical activity    Days per week: Not on file    Minutes per session: Not on file   Stress: Not on file  Relationships   Social connections    Talks on phone: Not on file    Gets together: Not on file    Attends religious service: Not on file    Active member of club or organization: Not on file    Attends meetings of clubs or organizations: Not on file    Relationship status: Not on file  Other Topics Concern   Not on file  Social History Narrative   Not on file   Family History  Problem Relation Age of Onset   High blood pressure Mother    Cancer Mother        skin   Cancer Father        esophageal   Cancer Maternal Grandfather        breast   Scheduled Meds:  apixaban  5 mg Oral BID   Chlorhexidine Gluconate Cloth  6 each Topical Daily   dexamethasone  2 mg Oral Daily   feeding supplement (ENSURE ENLIVE)  237 mL Oral BID BM   feeding supplement (PRO-STAT SUGAR FREE 64)  30 mL Oral BID WC   gabapentin  100 mg Oral TID   levofloxacin  750 mg Oral Q24H   levothyroxine  12.5 mcg Oral Q0600   LORazepam  1 mg Intravenous Once   mouth rinse  15 mL Mouth Rinse BID   mupirocin ointment  1 application Nasal BID   senna  1 tablet Oral BID   Continuous Infusions:  sodium chloride Stopped (03/25/19 0457)   sodium chloride     PRN Meds:.sodium chloride, acetaminophen **OR** acetaminophen, HYDROcodone-acetaminophen, metoCLOPramide (REGLAN) injection, ondansetron **OR** ondansetron (ZOFRAN) IV Medications Prior to Admission:  Prior to Admission medications   Medication Sig Start Date End Date Taking? Authorizing Provider  acetaminophen (TYLENOL) 325 MG tablet Take 650 mg by mouth every 6 (six) hours as needed for mild pain. Reported on 06/15/2015   Yes [provider]  calcium carbonate (OS-CAL) 600 MG TABS tablet Take 600 mg by mouth daily.    Yes [provider]  ELIQUIS 5 MG TABS tablet TAKE 1 TABLET(5 MG) BY MOUTH TWICE DAILY Patient taking differently: Take 5 mg by mouth 2 (two) times daily.  10/16/18  Yes Curt Bears, MD  feeding supplement, ENSURE ENLIVE, (ENSURE ENLIVE) LIQD Take 237 mLs by mouth 2 (two) times daily between meals. 12/16/18  Yes Hosie Poisson, MD  gabapentin (NEURONTIN) 100 MG capsule TAKE 3 CAPSULES(300 MG) BY MOUTH THREE TIMES DAILY Patient taking differently: Take 100 mg by mouth 3 (three) times daily.  11/01/18  Yes Curt Bears, MD  levothyroxine (SYNTHROID) 25 MCG tablet Take 1 tablet (25 mcg total) by mouth at bedtime. Patient taking differently: Take 25 mcg by mouth daily before breakfast.  12/16/18  Yes Hosie Poisson, MD  loperamide (IMODIUM) 2 MG capsule Take 2 mg by mouth daily as needed for diarrhea or loose stools. Reported on 11/22/2015   Yes [provider]  osimertinib mesylate (TAGRISSO) 80 MG tablet Take 2 tablets (160 mg total) by mouth daily. 10/04/18  Yes Curt Bears, MD  potassium chloride SA (K-DUR,KLOR-CON) 20 MEQ tablet Take 1 tablet (20 mEq total) by mouth at bedtime. 09/11/18  Yes Heilingoetter, Cassandra L, PA-C  prochlorperazine (COMPAZINE) 10 MG tablet Take 10 mg by mouth every 6 (six) hours as needed for nausea or vomiting.   Yes [provider]  spironolactone (ALDACTONE) 25 MG tablet Take 25 mg by mouth daily.    Yes [provider]  vitamin C (ASCORBIC ACID) 500 MG tablet Take 500 mg by mouth daily.   Yes [provider]  Amino Acids-Protein Hydrolys (FEEDING SUPPLEMENT, PRO-STAT SUGAR FREE 64,) LIQD Take 30 mLs by mouth 2 (two) times daily. Patient not taking: Reported on 03/21/2019 12/16/18   Hosie Poisson, MD   Allergies  Allergen Reactions   Peanut-Containing Drug Products     Excessive mucous   Penicillins     Hives, Childhood Allergy Has patient had a PCN reaction causing immediate rash, facial/tongue/throat swelling, SOB or  lightheadedness with hypotension: Yes Has patient had a PCN reaction causing severe rash involving mucus membranes or skin necrosis: No Has patient had a PCN reaction that required hospitalization: No Has patient had a PCN reaction occurring within the last 10 years: No If all of the above answers are "NO", then may proceed with Cephalosporin use.    Review of Systems Denies pain  Physical Exam +generalized weakness Chronically ill appearing Having cough/secretions Diminished breath sounds S1 S2 Abdomen soft non tender Trace edema Has tremors Awake alert Non focal R eye twitches  Vital Signs: BP (!) 83/34    Pulse (!) 116    Temp 98 F (36.7 C) (Oral)    Resp (!) 23    Ht 5\' 6"  (1.676 m)    Wt 74.8 kg    SpO2 99%    BMI 26.63 kg/m  Pain Scale: 0-10   Pain Score: 0-No pain   SpO2: SpO2: 99 % O2 Device:SpO2: 99 % O2 Flow Rate: .   IO: Intake/output summary:   Intake/Output Summary (Last 24 hours) at 03/25/2019 1401 Last data filed at 03/25/2019 0800 Gross per 24 hour  Intake 307.85 ml  Output 160 ml  Net 147.85 ml    LBM: Last BM Date: 03/23/19 Baseline Weight: Weight: 74.8 kg Most recent weight: Weight: 74.8 kg     Palliative Assessment/Data:   PPS 30%  Time In:  9 Time Out:  10 Time Total:  60 min.  Greater than 50%  of this time was spent counseling and coordinating care related to the above assessment and plan.  Signed by: Loistine Chance, MD [id number]  Please contact Palliative Medicine Team phone at [phone] for questions and concerns.  For individual provider: See Shea Evans

## 2019-03-25 NOTE — Progress Notes (Signed)
PROGRESS NOTE    Debra Hodges  QQP:[id number] DOB: 1951-03-19 DOA: 03/21/2019 PCP: Patient, No Pcp Per   Brief Narrative:  HPI per Dr. Toy Baker on 03/21/2019 Debra Hodges is a 68 y.o. female with medical history significant of metastatic non-small cell lung cancer, metastatic to liver, brain on oral chemotherapy, hypertension, DVT since 2019, on Eliquis  bed bound  Presented with   Tremors and right eye droopiness, increased nausea and vomiting Tagrisso,her last dose was 4 days ago. Reports tremor has been going on for past few weeks but has progressively getting worse.  Usually involving her right leg and right arm Last time patient was admitted in July for dehydration in the setting of nausea and vomiting  Reports for the past week her right leg have been weeker she is unable to walk . Her oncologist recommended stopping her Chemo due to ongoing nausea and vomiting  She continues to take Eliquis  denies any recent bleeding  Infectious risk factors:  Reports dry cough, In  ER RAPID COVID TEST  in house testing  Pending  Recent Labs       Lab Results  Component Value Date   [place] 12/17/2018   [place] NEGATIVE 12/11/2018   [place] NEGATIVE 11/17/2018       Regarding pertinent Chronic problems:    Non-small cell lung cancer stage IV diagnosed since 2015 this post whole brain XRT  Hx of DVT on Eliquis  Hypertension takes spironolactone   Hypothyroidism:  Recent Labs       Lab Results  Component Value Date   TSH <0.010 (L) 12/12/2018     on synthroid     While in ER: Noted to have elevated lactic acid up to 2.8 Hypokalemia of potassium down to 3.2 The following Work up has been ordered so far: Worrisome for left lower lobe pneumonia MRI showing progression of her brain metastases Plan for patient to get Decadron She received first dose in emergency department and had vomiting  **Interim History Nausea  is improved but continues to have tremors and droopiness.  SLP recommending soft diet. Had some questionable hematuria and urine yesterday was tea colored and it is now clearing up.  Will need to continue to monitor and obtain urinalysis and urine culture and will need to discuss with neuro oncology about brain metastasis in a.m. and Dr. Mickeal Skinner feels that the brain metastasis not causing her nausea or vomiting.  Medical oncology evaluated and she is no longer a candidate for chemotherapy due to her current condition.  Palliative care was consulted for further goals of care recommendations and her dexamethasone was transitioned to 2 mg p.o. daily.  Patient became hypotensive today and was given a fluid bolus and started back on IV fluids.  To discuss residential hospice versus home hospice with her husband and palliative care to follow-up tomorrow.  Assessment & Plan:   Active Problems:   Non-small cell carcinoma of lung, stage 4 (HCC)   Metastases to the liver [facility])   Hypothyroidism   Non-small cell lung cancer with metastasis (HCC)   Nausea and vomiting   Dehydration   Hypokalemia   Brain metastasis (HCC)   Sepsis (HCC)   Pressure injury of skin  Brain metastasis (HCC) -Showed a new metastatic deposit in the posterior splenium corpus callosum and had progression of metastatic disease in the right posterior parietal lobe  -initiate Decadron 4 mg q12h  -goal is for patient able to tolerate p.o. switch from IV; -Appreciate oncology  input and have Notified Dr. Mickeal Skinner about patient regarding his Opinion. Medical Oncology reviewed th case with Dr. Tammi Klippel who recommended against any additional Radiation -Dr. Mickeal Skinner reviewed her case and he feels that the new brain metastasis and progressive disease are not causing her symptoms and recommended stopping dexamethasone and transitioning to 2 mg p.o. daily -Medical oncology currently recommending hospice care but patient is not currently agreeable but is  agreeable to speak with palliative care for more detailed discussion of the Goals of Care -Appreciate palliative evaluation and they discussed in detail about home hospice versus residential hospice and he will discuss with the husband and follow-up with palliative care in a.m.  Non-small cell carcinoma of lung, stage 4 (HCC)  -Oncology is aware and I have added Dr. Earlie Server to the care team and will appreciate further evaluation recommendations -Medical oncology evaluated and feels that because of her functional status is declined to a point that she is no longer a candidate for any additional chemotherapy -Dexamethasone has been transitioned to p.o. daily -Appreciate Palliative Consultation and goals of care discussion and they are discussing hospice  Metastases to the liver ([place])  -Continue to monitor LFT's and T Bili; last AST and ALT were 32 and 18 respectively and T bili was 0.6 -Normalized now.  -Repeat CMP in a.m.  Sepsis from Pneumonia, improving -SIRS criteria met with tachycardia, elevated LA   -With evidence of end organ damage such as  elevated lactic acid    -Most likely source being: pulmonary   -Obtain serial lactic acid and procalcitonin level.  -Initiate IV antibiotics and C/w IV Levofloxacin for CAP and will continue for now  -Await results of blood and urine culture  -Rehydrated with normal saline rate of 75 mL's per hour given a 1.5 L fluid bolus given her hypotension -CXR yesterday showed "Stable compared to yesterday. Hazy opacity at the left base that could be pleural fluid and/or infiltrate. Left perihilar radiation fibrosis." -Currently not in any respiratory distress -Repeat CXR in AM   Hypothyroidism  -Checked TSH  And was <0.010 and will checked Free T4 and was 1.75 -Takes Levothyroxine at 25 mcg po Daily and may need adjustmentsand will decrease by half to 12.5 mcg po Daily    Intractable Nausea and vomiting, improving    -as noted in dehydration was  originally thought to be from Brain metastases but Dr. Mickeal Skinner feels that it is not related to the Mets -C/w Supportive Care  and Antiemetics; IVF as stopped but will restart and she is given 1.5 L in boluses given her hypotension -SLP evaluated and recommending Soft Diet if tolerated   Dehydration  -Fluids have stopped for now resumed and will follow.  Hypophosphatemia -Patient is phosphorus level was 1.7 -Replete with IV K-Phos 30 mmol -Continue to Monitor and Replete as Necessary -Repeat Phos Level in AM   Hypomagnesium  -Patient's magnesium this morning was 1.6 -Replete with IV mag sulfate 2 g -Continue to monitor and replete as necessary Repeat magnesium level in the a.m.  Hypokalemia -Patient's potassium is now 3.9 -Continue to monitor replete as necessary -Repeat CMP in a.m.  Poor Nutritional status  -Checked prealbumin and was 7.9 -Nutritionist consulted for further evaluation recommendations and recommending Ensure Enlive p.o. twice daily as well as prostat liquid p.o. 30 mL twice daily  Hx of DVT  -Holding Eliquis given her recent hematuria and may resume in AM as Hematuria is improving -Pt is at high risk for clotting given non-ambulatory status  but may stop Eliquis pending GOC Conversation and transition to hospice  Hyperbilirubinemia -Likely in the setting of Liver Metastasis -T bili went from 1.5 is now 0.6 -Continue to Monitor and Trend -Repeat CMP in AM   Normocytic Anemia -Patient's Hb/Hct went from 11.7/36.9 -> 10.2/32.5 -> 8.8/28.3 -> 9.2/28.0 -> 8.9/27.0 and now stable at 9.4/29.3 -Checked Anemia Panel and showed an iron level of 97, U IBC of 30, TIBC 127, saturation ratios of 77%, ferritin of 837, folate of 5.4, and vitamin B12 level 2295 -Continue to Monitor for S/Sx of Bleeding; Currently no overt bleeding noted  -Repeat CBC in AM   Questionable Hematuria and possible UTI, POA -Overnight there is report of some hematuria but urine was tea colored  per nursing this morning -Urinalysis showed a hazy appearance with large hemoglobin, 20 ketones, no bacteria seen, but greater than 50 RBCs per high-power field -Urinalysis showed less than 10,000 colonies of insignificant growth -We will continue Levofloxacin -We will continue IV fluid hydration with normal saline as above -Hold Eliquis currently for now and reevaluate -May need urology assistance but her urine is starting to clear  Thrombocytopenia -Patient platelet count now 145,000 and has been fluctuating -Had some questionable hematuria overnight and her urine in the pubic is tea colored -Urinalysis showed some large hemoglobin and RBCs per high-power field Plan continue to monitor and may need urological assistance and will hold Eliquis for today -Repeat CBC in the a.m. and continue to monitor for signs and symptoms of bleeding  Hypotension -Unclear etiology but blood pressure dropped and she is asymptomatic currently -Given a fluid bolus of 1.5 L--restarted IV fluid maintenance at 75 mL's per hour -Continue to monitor blood pressure carefully  DVT prophylaxis: Anticoagulated with Eliquis Code Status: DO NOT RESUSCITATE  Family Communication: No family present at bedside  Disposition Plan: Pending further improvement and workup and pending goals of care discussion with palliative care medicine.  May eventually go to hospice  Consultants:   Oncology  Neuro-oncology  Palliative care  Procedures: None  Antimicrobials:  Anti-infectives (From admission, onward)   Start     Dose/Rate Route Frequency Ordered Stop   03/24/19 1600  levofloxacin (LEVAQUIN) tablet 750 mg     750 mg Oral Every 24 hours 03/24/19 1116     03/22/19 1600  levofloxacin (LEVAQUIN) IVPB 750 mg  Status:  Discontinued     750 mg 100 mL/hr over 90 Minutes Intravenous Every 24 hours 03/21/19 1928 03/24/19 1116   03/21/19 1615  levofloxacin (LEVAQUIN) IVPB 750 mg     750 mg 100 mL/hr over 90 Minutes  Intravenous  Once 03/21/19 1610 03/21/19 1822     Subjective: Seen and examined at bedside and she states that she is about the same if not a little bit better today.  No chest pain, lightheadedness or dizziness but states that she did not sleep very well last night and had a rough night.  Continues to have eye droopiness on her right eye and continues to have tremors.  Nausea has resolved.  Palliative to have a goals of care conversation today.  No other concerns or complaints at this time.  Objective: Vitals:   03/25/19 1100 03/25/19 1200 03/25/19 1300 03/25/19 1400  BP: (!) 90/56 (!) 83/34 (!) 80/50 (!) 84/37  Pulse: (!) 110 (!) 116 99 98  Resp: (!) 21 (!) 23 (!) 24 (!) 23  Temp:  98.3 F (36.8 C)    TempSrc:  Oral    SpO2:  98% 99% 98% 99%  Weight:      Height:        Intake/Output Summary (Last 24 hours) at 03/25/2019 1639 Last data filed at 03/25/2019 1432 Gross per 24 hour  Intake 1656.93 ml  Output 160 ml  Net 1496.93 ml   Filed Weights   03/21/19 1354  Weight: 74.8 kg   Examination: Physical Exam:  Constitutional: Chronically ill-appearing overweight Caucasian female currently in no acute distress but does appear somewhat uncomfortable Eyes: Lids and conjunctive are normal.  Right eye is slightly twitching and droopy ENMT: External Ears, Nose appear normal. Grossly normal hearing.  Neck: Appears normal, supple, no cervical masses, normal ROM, no appreciable thyromegaly; no appreciable JVD Respiratory: Slightly diminished to to auscultation bilaterally, no wheezing, rales, rhonchi or crackles. Normal respiratory effort and patient is not tachypenic. No accessory muscle use.  Unlabored breathing Cardiovascular: Tachycardic rate but regular rhythm with rates in the 100s, mild extremity edema. 2+ pedal pulses. No carotid bruits.  Abdomen: Soft, non-tender, slightly distended.  Bowel sounds positive x4.  GU: Deferred. Musculoskeletal: No clubbing / cyanosis of  digits/nails. No joint deformity upper and lower extremities.  Skin: No rashes, lesions, ulcers on limited skin evaluation. No induration; Warm and dry.  Neurologic: CN 2-12 grossly intact with no focal deficits but continues to have some tremors and had a tremor in her right arm and bilateral lower extremities now that are more pronounced  Psychiatric: Normal judgment and insight. Alert and oriented x 3.  Slightly depressed appearing mood and flat affect  Data Reviewed: I have personally reviewed following labs and imaging studies  CBC: Recent Labs  Lab 03/21/19 1359 03/22/19 0149 03/23/19 0207 03/23/19 1115 03/24/19 0238 03/25/19 0148  WBC 6.8 5.5 6.4 8.1 6.9 9.7  NEUTROABS 4.6  --   --   --  6.0 7.5  HGB 11.7* 10.2* 8.8* 9.2* 8.9* 9.4*  HCT 36.9 32.5* 28.3* 28.0* 27.0* 29.3*  MCV 93.2 93.9 94.3 91.8 91.2 91.3  PLT 186 143* 128* 141* 129* 341*   Basic Metabolic Panel: Recent Labs  Lab 03/21/19 1359 03/21/19 1406 03/21/19 2157 03/22/19 0149 03/23/19 0207 03/24/19 0238 03/25/19 0148  NA 138  --   --  137 136 138 139  K 3.2*  --   --  3.3* 4.0 4.5 3.9  CL 98  --   --  101 106 108 108  CO2 24  --   --  '23 23 23 23  '$ GLUCOSE 99  --   --  84 115* 131* 117*  BUN 28*  --   --  25* 24* 18 23  CREATININE 0.89 0.70  --  0.73 0.63 0.50 0.50  CALCIUM 9.1  --   --  8.4* 8.1* 8.1* 8.2*  MG  --   --  1.7 1.9 1.7 1.7 1.6*  PHOS  --   --  2.8 3.0 2.1* 2.7 1.7*   GFR: Estimated Creatinine Clearance: 69.6 mL/min (by C-G formula based on SCr of 0.5 mg/dL). Liver Function Tests: Recent Labs  Lab 03/21/19 1359 03/22/19 0149 03/23/19 0207 03/24/19 0238 03/25/19 0148  AST '30 25 26 '$ 32 40  ALT '13 10 13 18 27  '$ ALKPHOS 153* 115 112 101 96  BILITOT 1.5* 1.5* 1.0 0.6 0.6  PROT 6.9 5.9* 5.1* 4.8* 4.7*  ALBUMIN 3.0* 2.7* 2.4* 2.2* 2.2*   Recent Labs  Lab 03/21/19 1359  LIPASE 28   No results for input(s): AMMONIA in the last 168 hours.  Coagulation Profile: Recent Labs  Lab  03/21/19 2157  INR 1.7*   Cardiac Enzymes: Recent Labs  Lab 03/21/19 2157  CKTOTAL 27*   BNP (last 3 results) No results for input(s): PROBNP in the last 8760 hours. HbA1C: No results for input(s): HGBA1C in the last 72 hours. CBG: No results for input(s): GLUCAP in the last 168 hours. Lipid Profile: No results for input(s): CHOL, HDL, LDLCALC, TRIG, CHOLHDL, LDLDIRECT in the last 72 hours. Thyroid Function Tests: Recent Labs    03/23/19 1812  FREET4 1.75*   Anemia Panel: Recent Labs    03/24/19 0238  VITAMINB12 2,335*  FOLATE 5.4*  FERRITIN 837*  TIBC 127*  IRON 97  RETICCTPCT 2.1   Sepsis Labs: Recent Labs  Lab 03/21/19 1400 03/21/19 2157 03/22/19 0149  PROCALCITON  --  <0.10  --   LATICACIDVEN 2.8* 1.8 1.7    Recent Results (from the past 240 hour(s))  SARS CORONAVIRUS 2 (TAT 6-24 HRS) Nasopharyngeal Nasopharyngeal Swab     Status: None   Collection Time: 03/21/19  4:47 PM   Specimen: Nasopharyngeal Swab  Result Value Ref Range Status   SARS Coronavirus 2 NEGATIVE NEGATIVE Final    Comment: (NOTE) SARS-CoV-2 target nucleic acids are NOT DETECTED. The SARS-CoV-2 RNA is generally detectable in upper and lower respiratory specimens during the acute phase of infection. Negative results do not preclude SARS-CoV-2 infection, do not rule out co-infections with other pathogens, and should not be used as the sole basis for treatment or other patient management decisions. Negative results must be combined with clinical observations, patient history, and epidemiological information. The expected result is Negative. Fact Sheet for Patients: SugarRoll.be Fact Sheet for Healthcare Providers: https://www.woods-mathews.com/ This test is not yet approved or cleared by the [place] FDA and  has been authorized for detection and/or diagnosis of SARS-CoV-2 by FDA under an Emergency Use Authorization (EUA). This EUA will  remain  in effect (meaning this test can be used) for the duration of the COVID-19 declaration under Section 56 4(b)(1) of the Act, 21 U.S.C. section 360bbb-3(b)(1), unless the authorization is terminated or revoked sooner. Performed at [facility], [place] [address], [place], [place] [address]   Culture, blood (routine x 2) Call MD if unable to obtain prior to antibiotics being given     Status: None (Preliminary result)   Collection Time: 03/21/19  7:45 PM   Specimen: BLOOD  Result Value Ref Range Status   Specimen Description   Final    BLOOD RIGHT ANTECUBITAL Performed at [place] [address], [place], [place] [address]    Special Requests   Final    BOTTLES DRAWN AEROBIC AND ANAEROBIC Blood Culture results may not be optimal due to an excessive volume of blood received in culture bottles Performed at [place] [address], [place], [place] [address]    Culture   Final    NO GROWTH 3 DAYS Performed at [facility], [place] [address] [place], [place] [address]    Report Status PENDING  Incomplete  MRSA PCR Screening     Status: Abnormal   Collection Time: 03/21/19  9:09 PM   Specimen: Nasopharyngeal  Result Value Ref Range Status   MRSA by PCR POSITIVE (A) NEGATIVE Final    Comment:        The GeneXpert MRSA Assay (FDA approved for NASAL specimens only), is one component of a comprehensive MRSA colonization surveillance program. It is  not intended to diagnose MRSA infection nor to guide or monitor treatment for MRSA infections. RESULT CALLED TO, READ BACK BY AND VERIFIED WITH: DAVIS,W RN '@[id number]'$  ON 03/22/2019 JACKSON,K Performed at [facility], [place] [address], [place], [place] [address]   Culture, blood (routine x 2) Call MD if unable to obtain prior to antibiotics being given     Status: None (Preliminary result)   Collection Time: 03/21/19  9:57 PM   Specimen: BLOOD  Result Value Ref  Range Status   Specimen Description   Final    BLOOD RIGHT ANTECUBITAL Performed at [place] [address] [place], [place] [address]    Special Requests   Final    BOTTLES DRAWN AEROBIC AND ANAEROBIC Blood Culture adequate volume Performed at [place] [address] [place], [place] [address]    Culture   Final    NO GROWTH 3 DAYS Performed at [facility], [place] [address] [place], [place] [address]    Report Status PENDING  Incomplete  Culture, Urine     Status: Abnormal   Collection Time: 03/23/19  8:00 AM   Specimen: Urine, Random  Result Value Ref Range Status   Specimen Description   Final    URINE, RANDOM Performed at [place] [address], [place], [place] [address]    Special Requests   Final    NONE Performed at [facility], [place] [address], [place], [place] [address]    Culture (A)  Final    <10,000 COLONIES/mL INSIGNIFICANT GROWTH Performed at [place] [address] [place],  [address]    Report Status 03/24/2019 FINAL  Final    RN Pressure Injury Documentation and I am in Agreement with the RN's Documentation Pressure Injury 03/21/19 Coccyx Mid Stage II -  Partial thickness loss of dermis presenting as a shallow open ulcer with a red, pink wound bed without slough. (Active)  03/21/19 2130  Location: Coccyx  Location Orientation: Mid  Staging: Stage II -  Partial thickness loss of dermis presenting as a shallow open ulcer with a red, pink wound bed without slough.  Wound Description (Comments):   Present on Admission: Yes   Radiology Studies: Dg Chest Port 1 View  Result Date: 03/24/2019 CLINICAL DATA:  Shortness of breath.  Treated lung cancer EXAM: PORTABLE CHEST 1 VIEW COMPARISON:  Yesterday FINDINGS: Stable volume loss and perihilar opacity on the left attributed to fibrosis based on staging chest CT 12/12/2018. There is haziness of the  left chest with obscured left diaphragm, possible pleural effusion. The right lung is clear. Normal heart size. IMPRESSION: 1. Stable compared to yesterday. 2. Hazy opacity at the left base that could be pleural fluid and/or infiltrate. 3. Left perihilar radiation fibrosis. Electronically Signed   By: Monte Fantasia M.D.   On: 03/24/2019 06:33   Scheduled Meds: . apixaban  5 mg Oral BID  . Chlorhexidine Gluconate Cloth  6 each Topical Daily  . dexamethasone  2 mg Oral Daily  . feeding supplement (ENSURE ENLIVE)  237 mL Oral BID BM  . feeding supplement (PRO-STAT SUGAR FREE 64)  30 mL Oral BID WC  . gabapentin  100 mg Oral TID  . levofloxacin  750 mg Oral Q24H  . levothyroxine  12.5 mcg Oral Q0600  . LORazepam  1 mg Intravenous Once  . mouth rinse  15 mL Mouth Rinse BID  . mupirocin ointment  1 application Nasal BID  . senna  1 tablet Oral BID   Continuous Infusions: . sodium chloride Stopped (03/25/19 0457)  . sodium chloride      LOS: 4 days    Kerney Elbe, DO [facility] PAGER is on [place]  If 7PM-7AM, please contact night-coverage [email or web address] Password [facility] 03/25/2019, 4:39 PM

## 2019-03-26 ENCOUNTER — Inpatient Hospital Stay (HOSPITAL_COMMUNITY): Payer: Medicare Other

## 2019-03-26 LAB — CBC WITH DIFFERENTIAL/PLATELET
Abs Immature Granulocytes: 0.02 10*3/uL (ref 0.00–0.07)
Basophils Absolute: 0 10*3/uL (ref 0.0–0.1)
Basophils Relative: 0 %
Eosinophils Absolute: 0 10*3/uL (ref 0.0–0.5)
Eosinophils Relative: 0 %
HCT: 27.9 % — ABNORMAL LOW (ref 36.0–46.0)
Hemoglobin: 8.8 g/dL — ABNORMAL LOW (ref 12.0–15.0)
Immature Granulocytes: 0 %
Lymphocytes Relative: 15 %
Lymphs Abs: 1 10*3/uL (ref 0.7–4.0)
MCH: 29.8 pg (ref 26.0–34.0)
MCHC: 31.5 g/dL (ref 30.0–36.0)
MCV: 94.6 fL (ref 80.0–100.0)
Monocytes Absolute: 0.6 10*3/uL (ref 0.1–1.0)
Monocytes Relative: 9 %
Neutro Abs: 5.1 10*3/uL (ref 1.7–7.7)
Neutrophils Relative %: 76 %
Platelets: 106 10*3/uL — ABNORMAL LOW (ref 150–400)
RBC: 2.95 MIL/uL — ABNORMAL LOW (ref 3.87–5.11)
RDW: 16.8 % — ABNORMAL HIGH (ref 11.5–15.5)
WBC: 6.8 10*3/uL (ref 4.0–10.5)
nRBC: 0.3 % — ABNORMAL HIGH (ref 0.0–0.2)

## 2019-03-26 LAB — COMPREHENSIVE METABOLIC PANEL
ALT: 25 U/L (ref 0–44)
AST: 34 U/L (ref 15–41)
Albumin: 2.1 g/dL — ABNORMAL LOW (ref 3.5–5.0)
Alkaline Phosphatase: 114 U/L (ref 38–126)
Anion gap: 6 (ref 5–15)
BUN: 18 mg/dL (ref 8–23)
CO2: 23 mmol/L (ref 22–32)
Calcium: 7.8 mg/dL — ABNORMAL LOW (ref 8.9–10.3)
Chloride: 108 mmol/L (ref 98–111)
Creatinine, Ser: 0.46 mg/dL (ref 0.44–1.00)
GFR calc Af Amer: >60 mL/min (ref >60)
GFR calc non Af Amer: >60 mL/min (ref >60)
Glucose, Bld: 111 mg/dL — ABNORMAL HIGH (ref 70–99)
Potassium: 4 mmol/L (ref 3.5–5.1)
Sodium: 137 mmol/L (ref 135–145)
Total Bilirubin: 0.6 mg/dL (ref 0.3–1.2)
Total Protein: 4.4 g/dL — ABNORMAL LOW (ref 6.5–8.1)

## 2019-03-26 LAB — PHOSPHORUS: Phosphorus: 3.3 mg/dL (ref 2.5–4.6)

## 2019-03-26 LAB — MAGNESIUM: Magnesium: 2.1 mg/dL (ref 1.7–2.4)

## 2019-03-26 NOTE — Progress Notes (Signed)
Daily Progress Note   Patient Name: Debra Hodges       Date: 03/26/2019 DOB: 04/15/1951  Age: 68 y.o. MRN#: [id number] Attending Physician: [place] Eric J, DO Primary Care Physician: Patient, No Pcp Per Admit Date: 03/21/2019  Reason for Consultation/Follow-up: Establishing goals of care  Subjective: Patient states she did not rest well overnight.  She states that she had a lot on her mind.  Specifically, she was thinking about different options home with hospice versus residential hospice.  See below.  Length of Stay: 5  Current Medications: Scheduled Meds:  . apixaban  5 mg Oral BID  . Chlorhexidine Gluconate Cloth  6 each Topical Daily  . dexamethasone  2 mg Oral Daily  . feeding supplement (ENSURE ENLIVE)  237 mL Oral BID BM  . feeding supplement (PRO-STAT SUGAR FREE 64)  30 mL Oral BID WC  . gabapentin  100 mg Oral TID  . levofloxacin  750 mg Oral Q24H  . levothyroxine  12.5 mcg Oral Q0600  . LORazepam  1 mg Intravenous Once  . mouth rinse  15 mL Mouth Rinse BID  . mupirocin ointment  1 application Nasal BID  . senna  1 tablet Oral BID    Continuous Infusions: . sodium chloride Stopped (03/25/19 0457)  . sodium chloride 75 mL/hr at 03/26/19 0612  . sodium chloride      PRN Meds: sodium chloride, acetaminophen **OR** acetaminophen, HYDROcodone-acetaminophen, metoCLOPramide (REGLAN) injection, ondansetron **OR** ondansetron (ZOFRAN) IV  Physical Exam         Weak appearing lady has right eye droopiness and continues to have tremors Chronically ill-appearing Diminished breath sounds bilaterally both lung bases S1-S2 Awake alert oriented Flattened affect, depressed appearing mood Regular work of breathing Trace edema  Vital Signs: BP 128/63   Pulse (!) 117    Temp 98.2 F (36.8 C) (Oral)   Resp (!) 23   Ht 5\' 6"  (1.676 m)   Wt 74.8 kg   SpO2 98%   BMI 26.63 kg/m  SpO2: SpO2: 98 % O2 Device: O2 Device: Room Air O2 Flow Rate:    Intake/output summary:   Intake/Output Summary (Last 24 hours) at 03/26/2019 0933 Last data filed at 03/26/2019 0612 Gross per 24 hour  Intake 2618.89 ml  Output 900 ml  Net 1718.89 ml   LBM:  Last BM Date: 03/23/19(smear on 10/27) Baseline Weight: Weight: 74.8 kg Most recent weight: Weight: 74.8 kg       Palliative Assessment/Data:      Patient Active Problem List   Diagnosis Date Noted  . Pressure injury of skin 03/25/2019  . Brain metastasis ([place]) 03/21/2019  . Sepsis ([place]) 03/21/2019  . Syncope 12/11/2018  . AKI (acute kidney injury) ([place]) 12/11/2018  . Dehydration 12/11/2018  . Hypokalemia 12/11/2018  . Intractable nausea and vomiting 11/18/2018  . Primary malignant neoplasm of lung metastatic to other site [facility])   . Intractable vomiting with nausea 11/17/2018  . 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 Neutropenia due to and not concurrent with chemotherapy ([place]) 04/20/2018  . Nausea and vomiting 04/18/2018  . Neutropenia ([place]) 04/18/2018  . Acute pulmonary embolism ([place]) 03/07/2018  . Hypothyroidism 03/07/2018  . Non-small cell lung cancer with metastasis ([place])   . Goals of care, counseling/discussion 02/11/2018  . Encounter for antineoplastic immunotherapy 02/11/2018  . Metastases to the liver ([place]) 01/24/2017  . Liver lesion 06/26/2016  . Non-small cell carcinoma of lung, stage 4 ([place])   . Vaginal lesion 01/06/2015  . Encounter for antineoplastic chemotherapy 01/06/2015  . Malignant neoplasm of upper lobe of left lung ([place]) 11/13/2013  . Lung mass 10/16/2013    Palliative Care Assessment & Plan   Patient Profile: Debra Hodges is a 68 year old lady with a life limiting illness significant for metastatic non-small cell lung cancer, metastatic burden to liver, brain.  Recent  MRI of the brain shows disease progression.  Assessment: Brain metastases Non-small cell lung cancer stage IV Metastatic burden to liver Sepsis from pneumonia Intractable nausea vomiting Dehydration Functional decline Generalized deconditioning Minimal oral intake, high symptom burden from nausea vomiting, generalized pain.   Recommendations/Plan:  Ongoing discussions with patient regarding the serious nature of her illness as well as to figure out best possible disposition.  Patient's husband, children as well as 27-year-old grandchild live with her at home.  She states that as much as she would want to be home, she realizes she is very weak and has ongoing decline and is not eating well.  She would prefer to be considered for residential hospice.  She has questions about insurance/payment options, current visitor guidelines and limitations at hospice house.  Will request hospice liaison/CSW to follow-up with the patient.  Continue current pain and nonpain symptom management measures.  Prognosis appears limited, likely not more than 2-3 weeks at this point in time in my opinion.  Code Status:    Code Status Orders  (From admission, onward)         Start     Ordered   03/21/19 2136  Do not attempt resuscitation (DNR)  Continuous    Question Answer Comment  In the event of cardiac or respiratory ARREST Do not call a "code blue"   In the event of cardiac or respiratory ARREST Do not perform Intubation, CPR, defibrillation or ACLS   In the event of cardiac or respiratory ARREST Use medication by any route, position, wound care, and other measures to relive pain and suffering. May use oxygen, suction and manual treatment of airway obstruction as needed for comfort.      03/21/19 2135        Code Status History    Date Active Date Inactive Code Status Order ID Comments User Context   12/11/2018 2314 12/17/2018 2243 Full Code [id number]  Toy Baker, MD Inpatient   11/18/2018  0134 11/20/2018 1724  Full Code [id number]  Harvie Bridge, DO Inpatient   04/18/2018 1556 04/20/2018 1531 Full Code [id number]  Damita Lack, MD ED   03/07/2018 0013 03/09/2018 1508 Full Code [id number]  Vianne Bulls, MD ED   Advance Care Planning Activity       Prognosis:   < 2 weeks  Discharge Planning:  Hospice facility Discussed in detail with patient.  She lives in [place], [place].  She is familiar with hospice of [facility] in [place].   Care plan was discussed with patient, unable to reach husband by phone.  Also notified hospice liaison.  Appreciate their follow-up.  Thank you for allowing the Palliative Medicine Team to assist in the care of this patient.   Time In: 9 Time Out: 9.35 Total Time 35 Prolonged Time Billed  no       Greater than 50%  of this time was spent counseling and coordinating care related to the above assessment and plan.  Loistine Chance, MD [phone] Please contact Palliative Medicine Team phone at [phone] for questions and concerns.

## 2019-03-26 NOTE — Progress Notes (Signed)
Speech Language Pathology Discharge Patient Details Name: Debra Hodges MRN: [id number] DOB: 22-Nov-1950 Today's Date: 03/26/2019 Time:  -     Patient discharged from SLP services secondary to pt with plan for hospice.  Please see latest therapy progress note for current level of functioning and progress toward goals.    Progress and discharge plan discussed with patient and/or caregiver: did not speak to pt/spouse, reviewed chart showing pt with poor intake.  Eva, MS [facility] SLP Acute Rehab Services Pager [phone] Office [phone]   Debra Hodges

## 2019-03-26 NOTE — Progress Notes (Signed)
PT Cancellation Note  Patient Details Name: Debra Hodges MRN: [id number] DOB: August 25, 1950   Cancelled Treatment:    Reason Eval/Treat Not Completed: Plan is for residential hospice. Will sign off.   Weston Anna, PT Acute Rehabilitation Services Pager: [phone] Office: [phone]

## 2019-03-26 NOTE — Plan of Care (Deleted)
Pt is progressing; RN will continue to monitor pt as she prepares for transfer.

## 2019-03-26 NOTE — Progress Notes (Addendum)
AM labs showed Ca 7.8. Dr Baltazar Najjar was notified by RN; No new orders were placed because corrected Ca is WNL. (Albumin is 2.1) RN will continue to monitor and assess pt.

## 2019-03-26 NOTE — Progress Notes (Signed)
PROGRESS NOTE    Debra Hodges  UYQ:[id number] DOB: 1950/10/09 DOA: 03/21/2019 PCP: Patient, No Pcp Per   Brief Narrative:  Debra Hodges a 68 y.o.femalewith medical history significant of metastatic non-small cell lung cancer, metastatic to liver, brain on oral chemotherapy, hypertension, DVT since 2019 on Eliquis, bed bound who presented with tremors, right eye droop, nausea/vomiting.  Tremor has been ongoing for the past few weeks but since has progressed; localized right arm/right leg.  Also reports over the past week she has been unable to walk with overall gradual decline with poor oral intake.  She continues on palliative chemotherapy with Tagrisso,last dose was 4 days higher to admission.  Given her poor functional status, oncologist recommended stopping her Chemo due to ongoing nausea and vomiting.   Assessment & Plan:   Active Problems:   Non-small cell carcinoma of lung, stage 4 (HCC)   Metastases to the liver [facility])   Hypothyroidism   Non-small cell lung cancer with metastasis (HCC)   Nausea and vomiting   Dehydration   Hypokalemia   Brain metastasis (HCC)   Sepsis (HCC)   Pressure injury of skin   Stage IV non-small cell lung cancer with metastasis to brain, liver Patient follows with medical oncology outpatient, Dr. Earlie Hodges.  Currently on treatment with palliative chemo with last dose 4 days prior to hospitalization.  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  Per medical oncology, given her poor functional status and progressive decline she is no longer a candidate for any additional chemotherapy.  Case reviewed by neuro oncology and medical oncology Dr. Mickeal Hodges and Dr. Tammi Hodges who recommended any further radiation therapy. --Palliative care following, appreciate assistance --Continue dexamethasone 2 mg p.o. daily for symptomatic relief --Supportive care  with IV fluid hydration, antiemetics --Plan for residential hospice on discharge with [facility] --Social work for assistance with coordination  Sepsis, present on admission Community-acquired pneumonia in the setting of immunocompromise state Patient presenting with tachycardia, elevated lactic acid with endorgan damage with elevated lactic.  Chest x-ray notable for hazy opacity left base consistent with infiltrate versus radiation fibrosis. --Continue levothyroxine sodium 50 mg p.o. daily  Hypothyroidism TSH less than 0.010 with free T4 of 1.75.  On levothyroxine 25 mcg p.o. daily at home. --Reduce dose of levothyroxine to 12.5 mcg p.o. daily  Hypophosphatemia Patient was noted to have a phosphorus level of 1.7, repleted.  Hypomagnesemia Patient's magnesium on presentation 1.6, repleted.  Magnesium level today 2.1.  Adult failure to thrive Severe protein calorie malnutrition Prealbumin 7.9, nutrition following for further recommendations.  Continue Ensure and prostat.  Encourage increased oral intake.  Ultimately transitioning to hospice care.  Hx DVT On Eliquis at home.  Currently being held secondary to hematuria.  May not resume as patient transitioning to hospice care.  Hyperbilirubinemia Total bilirubin admission 1.5, now down to 0.6.  This is in the setting of known liver metastasis.  No further work-up at this time as patient transitioning to hospice.  Thrombocytopenia Platelet count 106 today.  Likely reduce in the setting of advanced cancer on current outpatient chemotherapy.  No need for transfusion at this time.  DVT prophylaxis: SCDs, Eliquis held secondary to hematuria; likely if transitions to hospice will not continue Code Status: DNR Family Communication: none Disposition Plan: Continue inpatient, patient discussing with family members regarding residential hospice, likely to transition to residential hospice next 1-2 days.   Consultants:  Palliative care  Procedures:   None  Antimicrobials:   Levofloxacin 10/23>>>   Subjective: Patient seen and examined at bedside, resting comfortably.  Continues with nausea, severe debility/weakness.  Continues to discuss with family regarding transition to hospice, most specifically residential hospice which he is interested in.  She understands her, unfortunate situation with her progressive cancer despite palliative treatment.  No other complaints or concerns at this time.  Denies headache, no fever/chills/night sweats, no chest pain, no palpitations, no shortness of breath, no abdominal pain.  No acute events overnight per nursing staff.  Objective: Vitals:   03/26/19 0600 03/26/19 0800 03/26/19 1000 03/26/19 1200  BP: 123/61 128/63 (!) 126/58 124/74  Pulse: (!) 101 (!) 117 (!) 120 (!) 129  Resp: (!) 23 (!) 23 (!) 25 (!) 23  Temp:  98.2 F (36.8 C)    TempSrc:  Oral    SpO2: 99% 98% 98% 97%  Weight:      Height:        Intake/Output Summary (Last 24 hours) at 03/26/2019 1243 Last data filed at 03/26/2019 0612 Gross per 24 hour  Intake 1438.89 ml  Output 900 ml  Net 538.89 ml   Filed Weights   03/21/19 1354  Weight: 74.8 kg    Examination:  General exam: Appears calm and comfortable, thin/cachectic/chronically ill in appearance, pale Respiratory system: Sounds slightly decreased bilateral bases otherwise clear to auscultation. Respiratory effort normal.  Oxygenating well on room air Cardiovascular system: S1 & S2 heard, RRR. No JVD, murmurs, rubs, gallops or clicks. No pedal edema. Gastrointestinal system: Abdomen is nondistended, soft and nontender. No organomegaly or masses felt. Normal bowel sounds heard. Central nervous system: Alert and oriented. No focal neurological deficits. Extremities: Symmetric 5 x 5 power. Skin: No rashes, lesions or ulcers Psychiatry: Judgement and insight appear normal.  Depressed mood, flat affect    Data Reviewed: I have  personally reviewed following labs and imaging studies  CBC: Recent Labs  Lab 03/21/19 1359  03/23/19 0207 03/23/19 1115 03/24/19 0238 03/25/19 0148 03/26/19 0159  WBC 6.8   < > 6.4 8.1 6.9 9.7 6.8  NEUTROABS 4.6  --   --   --  6.0 7.5 5.1  HGB 11.7*   < > 8.8* 9.2* 8.9* 9.4* 8.8*  HCT 36.9   < > 28.3* 28.0* 27.0* 29.3* 27.9*  MCV 93.2   < > 94.3 91.8 91.2 91.3 94.6  PLT 186   < > 128* 141* 129* 145* 106*   < > = values in this interval not displayed.   Basic Metabolic Panel: Recent Labs  Lab 03/22/19 0149 03/23/19 0207 03/24/19 0238 03/25/19 0148 03/26/19 0159  NA 137 136 138 139 137  K 3.3* 4.0 4.5 3.9 4.0  CL 101 106 108 108 108  CO2 23 23 23 23 23   GLUCOSE 84 115* 131* 117* 111*  BUN 25* 24* 18 23 18   CREATININE 0.73 0.63 0.50 0.50 0.46  CALCIUM 8.4* 8.1* 8.1* 8.2* 7.8*  MG 1.9 1.7 1.7 1.6* 2.1  PHOS 3.0 2.1* 2.7 1.7* 3.3   GFR: Estimated Creatinine Clearance: 69.6 mL/min (by C-G formula based on SCr of 0.46 mg/dL). Liver Function Tests: Recent Labs  Lab 03/22/19 0149 03/23/19 0207 03/24/19 0238 03/25/19 0148 03/26/19 0159  AST 25 26 32 40 34  ALT 10 13 18 27 25   ALKPHOS 115 112 101 96 114  BILITOT 1.5* 1.0 0.6 0.6 0.6  PROT 5.9* 5.1* 4.8* 4.7* 4.4*  ALBUMIN 2.7* 2.4*  2.2* 2.2* 2.1*   Recent Labs  Lab 03/21/19 1359  LIPASE 28   No results for input(s): AMMONIA in the last 168 hours. Coagulation Profile: Recent Labs  Lab 03/21/19 2157  INR 1.7*   Cardiac Enzymes: Recent Labs  Lab 03/21/19 2157  CKTOTAL 27*   BNP (last 3 results) No results for input(s): PROBNP in the last 8760 hours. HbA1C: No results for input(s): HGBA1C in the last 72 hours. CBG: No results for input(s): GLUCAP in the last 168 hours. Lipid Profile: No results for input(s): CHOL, HDL, LDLCALC, TRIG, CHOLHDL, LDLDIRECT in the last 72 hours. Thyroid Function Tests: Recent Labs    03/23/19 1812  FREET4 1.75*   Anemia Panel: Recent Labs    03/24/19 0238   VITAMINB12 2,335*  FOLATE 5.4*  FERRITIN 837*  TIBC 127*  IRON 97  RETICCTPCT 2.1   Sepsis Labs: Recent Labs  Lab 03/21/19 1400 03/21/19 2157 03/22/19 0149  PROCALCITON  --  <0.10  --   LATICACIDVEN 2.8* 1.8 1.7    Recent Results (from the past 240 hour(s))  SARS CORONAVIRUS 2 (TAT 6-24 HRS) Nasopharyngeal Nasopharyngeal Swab     Status: None   Collection Time: 03/21/19  4:47 PM   Specimen: Nasopharyngeal Swab  Result Value Ref Range Status   SARS Coronavirus 2 NEGATIVE NEGATIVE Final    Comment: (NOTE) SARS-CoV-2 target nucleic acids are NOT DETECTED. The SARS-CoV-2 RNA is generally detectable in upper and lower respiratory specimens during the acute phase of infection. Negative results do not preclude SARS-CoV-2 infection, do not rule out co-infections with other pathogens, and should not be used as the sole basis for treatment or other patient management decisions. Negative results must be combined with clinical observations, patient history, and epidemiological information. The expected result is Negative. Fact Sheet for Patients: SugarRoll.be Fact Sheet for Healthcare Providers: https://www.woods-mathews.com/ This test is not yet approved or cleared by the [place] FDA and  has been authorized for detection and/or diagnosis of SARS-CoV-2 by FDA under an Emergency Use Authorization (EUA). This EUA will remain  in effect (meaning this test can be used) for the duration of the COVID-19 declaration under Section 56 4(b)(1) of the Act, 21 U.S.C. section 360bbb-3(b)(1), unless the authorization is terminated or revoked sooner. Performed at [facility], [place] [address] [place], [place] [address]   Culture, blood (routine x 2) Call MD if unable to obtain prior to antibiotics being given     Status: None (Preliminary result)   Collection Time: 03/21/19  7:45 PM   Specimen: BLOOD  Result Value Ref Range Status    Specimen Description   Final    BLOOD RIGHT ANTECUBITAL Performed at [place] [address] [place], [place] [address]    Special Requests   Final    BOTTLES DRAWN AEROBIC AND ANAEROBIC Blood Culture results may not be optimal due to an excessive volume of blood received in culture bottles Performed at [place] [address], [place], [place] [address]    Culture   Final    NO GROWTH 4 DAYS Performed at [facility], [place] [address], [place], [place] [address]    Report Status PENDING  Incomplete  MRSA PCR Screening     Status: Abnormal   Collection Time: 03/21/19  9:09 PM   Specimen: Nasopharyngeal  Result Value Ref Range Status   MRSA by PCR POSITIVE (A) NEGATIVE Final    Comment:  The GeneXpert MRSA Assay (FDA approved for NASAL specimens only), is one component of a comprehensive MRSA colonization surveillance program. It is not intended to diagnose MRSA infection nor to guide or monitor treatment for MRSA infections. RESULT CALLED TO, READ BACK BY AND VERIFIED WITH: DAVIS,W RN @0154  ON 03/22/2019 JACKSON,K Performed at [facility], [place] [address] [place], [place] [address]   Culture, blood (routine x 2) Call MD if unable to obtain prior to antibiotics being given     Status: None (Preliminary result)   Collection Time: 03/21/19  9:57 PM   Specimen: BLOOD  Result Value Ref Range Status   Specimen Description   Final    BLOOD RIGHT ANTECUBITAL Performed at [facility] [address], [place], [place] [address]    Special Requests   Final    BOTTLES DRAWN AEROBIC AND ANAEROBIC Blood Culture adequate volume Performed at [place] [address] [place], [place] [address]    Culture   Final    NO GROWTH 4 DAYS Performed at [facility], [place] [address] [place], [place] [address]    Report Status PENDING  Incomplete  Culture, Urine      Status: Abnormal   Collection Time: 03/23/19  8:00 AM   Specimen: Urine, Random  Result Value Ref Range Status   Specimen Description   Final    URINE, RANDOM Performed at [place] [address], [place], [place] [address]    Special Requests   Final    NONE Performed at [facility], [employer] [address], [place], [place] [address]    Culture (A)  Final    <10,000 COLONIES/mL INSIGNIFICANT GROWTH Performed at [place] [address] [place], [place] [address]    Report Status 03/24/2019 FINAL  Final         Radiology Studies: Dg Chest Port 1 View  Result Date: 03/26/2019 CLINICAL DATA:  Shortness of breath. EXAM: PORTABLE CHEST 1 VIEW COMPARISON:  March 24, 2019. FINDINGS: Stable cardiomediastinal silhouette. Right lung is clear. Stable opacification of left hemithorax is noted consistent with layering pleural effusion and associated atelectasis. Stable left perihilar fibrosis is noted. No pneumothorax is noted. Bony thorax is unremarkable. IMPRESSION: Stable left lung opacity as described above. Electronically Signed   By: Marijo Conception M.D.   On: 03/26/2019 07:53        Scheduled Meds: . apixaban  5 mg Oral BID  . Chlorhexidine Gluconate Cloth  6 each Topical Daily  . dexamethasone  2 mg Oral Daily  . feeding supplement (ENSURE ENLIVE)  237 mL Oral BID BM  . feeding supplement (PRO-STAT SUGAR FREE 64)  30 mL Oral BID WC  . gabapentin  100 mg Oral TID  . levofloxacin  750 mg Oral Q24H  . levothyroxine  12.5 mcg Oral Q0600  . LORazepam  1 mg Intravenous Once  . mouth rinse  15 mL Mouth Rinse BID  . mupirocin ointment  1 application Nasal BID  . senna  1 tablet Oral BID   Continuous Infusions: . sodium chloride Stopped (03/25/19 0457)  . sodium chloride 75 mL/hr at 03/26/19 0612  . sodium chloride       LOS: 5 days    Time spent: 32 minutes spent on chart review, discussion with nursing staff, consultants,  updating family and interview/physical exam; more than 50% of that time was spent in counseling and/or coordination of care.  Nusrat Encarnacion J [place] DO [facility] 03/26/2019, 12:43 PM

## 2019-03-26 NOTE — TOC Progression Note (Addendum)
Transition of Care [facility]) - Progression Note    Patient Details  Name: Debra Hodges MRN: [id number] Date of Birth: 03-15-1951  Transition of Care [facility]) CM/SW [place], [place] Phone Number: 03/26/2019, 12:07 PM  Clinical Narrative:    CSW met with the patient at beside to discuss residential hospice facility options. Patient is agreeable to [facility]. Patient has requested to discuss her choice with her spouse and daughter. CSW will move forward with referral process when the patient has conversation with her family.  TOC staff will continue to assist with discharge planning.   Update:Patient discussed residential hospice with her daughter this afternoon. Patient reports she will inform her spouse tomorrow morning.  [facility] aware of placement need, hospice liaison will follow up with the patient her family after the conversation with her spouse in the am.       Barriers to Discharge: No Barriers Identified  Expected Discharge Plan and Services  Residential Hospice                                                Social Determinants of Health (SDOH) Interventions    Readmission Risk Interventions Readmission Risk Prevention Plan 03/26/2019 03/24/2019 03/24/2019  Transportation Screening Complete - Complete  Medication Review Press photographer) Complete - Complete  PCP or Specialist appointment within 3-5 days of discharge Complete - -  [place] or [place] - - Complete  SW Recovery Care/Counseling Consult Complete - -  Palliative Care Screening Complete Complete -  [place] Not Applicable - -  Some recent data might be hidden

## 2019-03-27 ENCOUNTER — Encounter (HOSPITAL_COMMUNITY): Payer: Self-pay

## 2019-03-27 LAB — CULTURE, BLOOD (ROUTINE X 2)
Culture: NO GROWTH
Culture: NO GROWTH
Special Requests: ADEQUATE

## 2019-03-27 MED ORDER — ENSURE ENLIVE PO LIQD
237.0000 mL | Freq: Three times a day (TID) | ORAL | Status: DC
  Administered 2019-03-27 – 2019-03-29 (×6): 237 mL via ORAL

## 2019-03-27 NOTE — Progress Notes (Deleted)
MD aware. Will continue with q1h vitals.

## 2019-03-27 NOTE — Progress Notes (Signed)
Pt was awake during my visit; her husband was bedside. He said they have seen better days and expressed his concern regarding finding her a place to go, ie hospice or "nursing home."  He was very appreciative of visit and spoke of how people are praying. Debra Hodges was also appreciative of visit and found prayer helpful. I offered listing support as well as presence and spiritual support. Please page if additional assistance is needed. [place], [place]   03/27/19 1800  Clinical Encounter Type  Visited With Patient and family together

## 2019-03-27 NOTE — Progress Notes (Signed)
Daily Progress Note   Patient Name: Debra Hodges       Date: 03/27/2019 DOB: 10-Dec-1950  Age: 68 y.o. MRN#: [id number] Attending Physician: [place] Eric J, DO Primary Care Physician: Patient, No Pcp Per Admit Date: 03/21/2019  Reason for Consultation/Follow-up: Establishing goals of care  Subjective: Patient is resting in bed, denies complaints, is awaiting meeting with hospice liaison and possible transfer later today.     Length of Stay: 6  Current Medications: Scheduled Meds:  . apixaban  5 mg Oral BID  . Chlorhexidine Gluconate Cloth  6 each Topical Daily  . dexamethasone  2 mg Oral Daily  . feeding supplement (ENSURE ENLIVE)  237 mL Oral BID BM  . feeding supplement (PRO-STAT SUGAR FREE 64)  30 mL Oral BID WC  . gabapentin  100 mg Oral TID  . levothyroxine  12.5 mcg Oral Q0600  . LORazepam  1 mg Intravenous Once  . mouth rinse  15 mL Mouth Rinse BID  . mupirocin ointment  1 application Nasal BID  . senna  1 tablet Oral BID    Continuous Infusions: . sodium chloride Stopped (03/25/19 0457)  . sodium chloride 75 mL/hr at 03/26/19 1854    PRN Meds: sodium chloride, acetaminophen **OR** acetaminophen, HYDROcodone-acetaminophen, metoCLOPramide (REGLAN) injection, ondansetron **OR** ondansetron (ZOFRAN) IV  Physical Exam         Weak appearing lady has right eye droopiness and continues to have tremors Chronically ill-appearing Diminished breath sounds bilaterally both lung bases S1-S2 Awake alert oriented Flattened affect, depressed appearing mood Regular work of breathing Trace edema  Vital Signs: BP 114/65   Pulse (!) 113   Temp 98.7 F (37.1 C) (Oral)   Resp 19   Ht 5\' 6"  (1.676 m)   Wt 74.8 kg   SpO2 99%   BMI 26.63 kg/m  SpO2: SpO2: 99 % O2  Device: O2 Device: Room Air O2 Flow Rate:    Intake/output summary:   Intake/Output Summary (Last 24 hours) at 03/27/2019 0859 Last data filed at 03/27/2019 0600 Gross per 24 hour  Intake 658.94 ml  Output 900 ml  Net -241.06 ml   LBM: Last BM Date: 03/23/19 Baseline Weight: Weight: 74.8 kg Most recent weight: Weight: 74.8 kg       Palliative Assessment/Data:      Patient Active  Problem List   Diagnosis Date Noted  . Pressure injury of skin 03/25/2019  . Brain metastasis ([place]) 03/21/2019  . Sepsis ([place]) 03/21/2019  . Syncope 12/11/2018  . AKI (acute kidney injury) ([place]) 12/11/2018  . Dehydration 12/11/2018  . Hypokalemia 12/11/2018  . Intractable nausea and vomiting 11/18/2018  . Primary malignant neoplasm of lung metastatic to other site [facility])   . Intractable vomiting with nausea 11/17/2018  . 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 Neutropenia due to and not concurrent with chemotherapy ([place]) 04/20/2018  . Nausea and vomiting 04/18/2018  . Neutropenia ([place]) 04/18/2018  . Acute pulmonary embolism ([place]) 03/07/2018  . Hypothyroidism 03/07/2018  . Non-small cell lung cancer with metastasis ([place])   . Goals of care, counseling/discussion 02/11/2018  . Encounter for antineoplastic immunotherapy 02/11/2018  . Metastases to the liver ([place]) 01/24/2017  . Liver lesion 06/26/2016  . Non-small cell carcinoma of lung, stage 4 ([place])   . Vaginal lesion 01/06/2015  . Encounter for antineoplastic chemotherapy 01/06/2015  . Malignant neoplasm of upper lobe of left lung ([place]) 11/13/2013  . Lung mass 10/16/2013    Palliative Care Assessment & Plan   Patient Profile: Debra Hodges is a 68 year old lady with a life limiting illness significant for metastatic non-small cell lung cancer, metastatic burden to liver, brain.  Recent MRI of the brain shows disease progression.  Assessment: Brain metastases Non-small cell lung cancer stage IV Metastatic burden to liver Sepsis  from pneumonia Intractable nausea vomiting Dehydration Functional decline Generalized deconditioning Minimal oral intake, high symptom burden from nausea vomiting, generalized pain.   Recommendations/Plan:  Awaiting transfer to residential hospice.   Continue current pain and nonpain symptom management measures.  Prognosis appears limited, likely not more than 2-3 weeks at this point in time in my opinion.  Code Status:    Code Status Orders  (From admission, onward)         Start     Ordered   03/21/19 2136  Do not attempt resuscitation (DNR)  Continuous    Question Answer Comment  In the event of cardiac or respiratory ARREST Do not call a "code blue"   In the event of cardiac or respiratory ARREST Do not perform Intubation, CPR, defibrillation or ACLS   In the event of cardiac or respiratory ARREST Use medication by any route, position, wound care, and other measures to relive pain and suffering. May use oxygen, suction and manual treatment of airway obstruction as needed for comfort.      03/21/19 2135        Code Status History    Date Active Date Inactive Code Status Order ID Comments User Context   12/11/2018 2314 12/17/2018 2243 Full Code [id number]  Toy Baker, MD Inpatient   11/18/2018 0134 11/20/2018 1724 Full Code [id number]  Harvie Bridge, DO Inpatient   04/18/2018 1556 04/20/2018 1531 Full Code [id number]  Damita Lack, MD ED   03/07/2018 0013 03/09/2018 1508 Full Code [id number]  Opyd, Ilene Qua, MD ED   Advance Care Planning Activity       Prognosis:   < 2 weeks  Discharge Planning:  Hospice facility Discussed in detail with patient.  She lives in [place], [place].  She is familiar with hospice of [facility] in [place].   Care plan was discussed with patient   Thank you for allowing the Palliative Medicine Team to assist in the care of this patient.   Time In: 9 Time Out: 9.25 Total Time 25 Prolonged Time  Billed  no        Greater than 50%  of this time was spent counseling and coordinating care related to the above assessment and plan.  Loistine Chance, MD   Please contact Palliative Medicine Team phone at [phone] for questions and concerns.

## 2019-03-27 NOTE — Progress Notes (Signed)
[facility]  Pt chart reviewed spoke to pt and pt's spouse over phone. They are in agreement with hospice services. However after review and discussion with our medical director. She is not felt to be meeting GIP criteria. She was not approved for the [facility]. Spoke to the family to let them know and the pt states that she will need to go to a SNF. I encouraged them to tet the MD know and the CM at the hospital of their wishes.   She is eligible for hospice care at home or in SNF.  Jannette Fogo RN [phone]

## 2019-03-27 NOTE — Progress Notes (Signed)
PROGRESS NOTE    Debra Hodges  PYK:[id number] DOB: 12-Nov-1950 DOA: 03/21/2019 PCP: Patient, No Pcp Per   Brief Narrative:  Debra Hodges a 68 y.o.femalewith medical history significant of metastatic non-small cell lung cancer, metastatic to liver, brain on oral chemotherapy, hypertension, DVT since 2019 on Eliquis, bed bound who presented with tremors, right eye droop, nausea/vomiting.  Tremor has been ongoing for the past few weeks but since has progressed; localized right arm/right leg.  Also reports over the past week she has been unable to walk with overall gradual decline with poor oral intake.  She continues on palliative chemotherapy with Tagrisso,last dose was 4 days higher to admission.  Given her poor functional status, oncologist recommended stopping her Chemo due to ongoing nausea and vomiting.   Assessment & Plan:   Active Problems:   Non-small cell carcinoma of lung, stage 4 (HCC)   Metastases to the liver [facility])   Hypothyroidism   Non-small cell lung cancer with metastasis (HCC)   Nausea and vomiting   Dehydration   Hypokalemia   Brain metastasis (HCC)   Sepsis (HCC)   Pressure injury of skin   Stage IV non-small cell lung cancer with metastasis to brain, liver Patient follows with medical oncology outpatient, Dr. Earlie Server.  Currently on treatment with palliative chemo with last dose 4 days prior to hospitalization.  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  Per medical oncology, given her poor functional status and progressive decline she is no longer a candidate for any additional chemotherapy.  Case reviewed by neuro oncology and medical oncology Dr. Mickeal Skinner and Dr. Tammi Klippel who recommended any further radiation therapy. --Palliative care following, appreciate assistance --Continue dexamethasone 2 mg p.o. daily for symptomatic relief --Supportive care  with IV fluid hydration, antiemetics --[facility] declined admission on 10/29; states "does not meet criteria"  --Social work for assistance with coordination with either other Residential Hospice vs SNF  Sepsis, present on admission Community-acquired pneumonia in the setting of immunocompromise state Patient presenting with tachycardia, elevated lactic acid with endorgan damage with elevated lactic.  Chest x-ray notable for hazy opacity left base consistent with infiltrate versus radiation fibrosis.  Completed course of Levaquin.  Hypothyroidism TSH less than 0.010 with free T4 of 1.75.  On levothyroxine 25 mcg p.o. daily at home. --Reduce dose of levothyroxine to 12.5 mcg p.o. daily  Hypophosphatemia Patient was noted to have a phosphorus level of 1.7, repleted.  Hypomagnesemia Patient's magnesium on presentation 1.6, repleted.  Magnesium level today 2.1.  Adult failure to thrive Severe protein calorie malnutrition Prealbumin 7.9, nutrition following for further recommendations.  Continue Ensure and prostat.  Encourage increased oral intake.  Ultimately transitioning to hospice care.  Hx DVT On Eliquis at home.  Currently being held secondary to hematuria.  May not resume as patient transitioning to hospice care.  Hyperbilirubinemia Total bilirubin admission 1.5, now down to 0.6.  This is in the setting of known liver metastasis.  No further work-up at this time as patient transitioning to hospice.  Thrombocytopenia Platelet count 106 today.  Likely reduce in the setting of advanced cancer on current outpatient chemotherapy.  No need for transfusion at this time.  DVT prophylaxis: SCDs, Eliquis discontinued secondary to hematuria Code Status: DNR Family Communication: none Disposition Plan: Continue inpatient, transfer to [place], likely to transition to residential hospice versus SNF; as [facility]    Consultants:  Palliative care  Procedures:     None  Antimicrobials:   Levofloxacin 10/23 - 10/28   Subjective: Patient seen and examined at bedside, resting comfortably.  Continues with nausea, severe debility/weakness.  Has decided to transition to residential hospice.  Unfortunately was turned down by hosp[facility] of [facility] for inpatient for unclear reasons.  No other complaints or concerns at this time.  Denies headache, no fever/chills/night sweats, no chest pain, no palpitations, no shortness of breath, no abdominal pain.  No acute events overnight per nursing staff.  Objective: Vitals:   03/27/19 0600 03/27/19 0700 03/27/19 0800 03/27/19 1200  BP:   114/65 121/68  Pulse: 87 (!) 102 (!) 113 (!) 119  Resp: 20 (!) 23 19 (!) 21  Temp:   98.7 F (37.1 C) 98.6 F (37 C)  TempSrc:   Oral Oral  SpO2: 99% 99% 99% 97%  Weight:      Height:        Intake/Output Summary (Last 24 hours) at 03/27/2019 1358 Last data filed at 03/27/2019 0600 Gross per 24 hour  Intake 658.94 ml  Output 900 ml  Net -241.06 ml   Filed Weights   03/21/19 1354  Weight: 74.8 kg    Examination:  General exam: Appears calm and comfortable, thin/cachectic/chronically ill in appearance, pale Respiratory system: Sounds slightly decreased bilateral bases otherwise clear to auscultation. Respiratory effort normal.  Oxygenating well on room air Cardiovascular system: S1 & S2 heard, RRR. No JVD, murmurs, rubs, gallops or clicks. No pedal edema. Gastrointestinal system: Abdomen is nondistended, soft and nontender. No organomegaly or masses felt. Normal bowel sounds heard. Central nervous system: Alert and oriented. No focal neurological deficits. Extremities: Symmetric 5 x 5 power. Skin: No rashes, lesions or ulcers Psychiatry: Judgement and insight appear normal.  Depressed mood, flat affect    Data Reviewed: I have personally reviewed following labs and imaging studies  CBC: Recent Labs  Lab 03/21/19 1359  03/23/19 0207 03/23/19 1115  03/24/19 0238 03/25/19 0148 03/26/19 0159  WBC 6.8   < > 6.4 8.1 6.9 9.7 6.8  NEUTROABS 4.6  --   --   --  6.0 7.5 5.1  HGB 11.7*   < > 8.8* 9.2* 8.9* 9.4* 8.8*  HCT 36.9   < > 28.3* 28.0* 27.0* 29.3* 27.9*  MCV 93.2   < > 94.3 91.8 91.2 91.3 94.6  PLT 186   < > 128* 141* 129* 145* 106*   < > = values in this interval not displayed.   Basic Metabolic Panel: Recent Labs  Lab 03/22/19 0149 03/23/19 0207 03/24/19 0238 03/25/19 0148 03/26/19 0159  NA 137 136 138 139 137  K 3.3* 4.0 4.5 3.9 4.0  CL 101 106 108 108 108  CO2 23 23 23 23 23   GLUCOSE 84 115* 131* 117* 111*  BUN 25* 24* 18 23 18   CREATININE 0.73 0.63 0.50 0.50 0.46  CALCIUM 8.4* 8.1* 8.1* 8.2* 7.8*  MG 1.9 1.7 1.7 1.6* 2.1  PHOS 3.0 2.1* 2.7 1.7* 3.3   GFR: Estimated Creatinine Clearance: 69.6 mL/min (by C-G formula based on SCr of 0.46 mg/dL). Liver Function Tests: Recent Labs  Lab 03/22/19 0149 03/23/19 0207 03/24/19 0238 03/25/19 0148 03/26/19 0159  AST 25 26 32 40 34  ALT 10 13 18 27 25   ALKPHOS 115 112 101 96 114  BILITOT 1.5* 1.0 0.6 0.6 0.6  PROT 5.9* 5.1* 4.8* 4.7* 4.4*  ALBUMIN 2.7* 2.4* 2.2* 2.2* 2.1*   Recent Labs  Lab 03/21/19 1359  LIPASE 28   No results for input(s): AMMONIA in the last 168 hours. Coagulation Profile: Recent Labs  Lab 03/21/19 2157  INR 1.7*   Cardiac Enzymes: Recent Labs  Lab 03/21/19 2157  CKTOTAL 27*   BNP (last 3 results) No results for input(s): PROBNP in the last 8760 hours. HbA1C: No results for input(s): HGBA1C in the last 72 hours. CBG: No results for input(s): GLUCAP in the last 168 hours. Lipid Profile: No results for input(s): CHOL, HDL, LDLCALC, TRIG, CHOLHDL, LDLDIRECT in the last 72 hours. Thyroid Function Tests: No results for input(s): TSH, T4TOTAL, FREET4, T3FREE, THYROIDAB in the last 72 hours. Anemia Panel: No results for input(s): VITAMINB12, FOLATE, FERRITIN, TIBC, IRON, RETICCTPCT in the last 72 hours. Sepsis Labs: Recent Labs   Lab 03/21/19 1400 03/21/19 2157 03/22/19 0149  PROCALCITON  --  <0.10  --   LATICACIDVEN 2.8* 1.8 1.7    Recent Results (from the past 240 hour(s))  SARS CORONAVIRUS 2 (TAT 6-24 HRS) Nasopharyngeal Nasopharyngeal Swab     Status: None   Collection Time: 03/21/19  4:47 PM   Specimen: Nasopharyngeal Swab  Result Value Ref Range Status   SARS Coronavirus 2 NEGATIVE NEGATIVE Final    Comment: (NOTE) SARS-CoV-2 target nucleic acids are NOT DETECTED. The SARS-CoV-2 RNA is generally detectable in upper and lower respiratory specimens during the acute phase of infection. Negative results do not preclude SARS-CoV-2 infection, do not rule out co-infections with other pathogens, and should not be used as the sole basis for treatment or other patient management decisions. Negative results must be combined with clinical observations, patient history, and epidemiological information. The expected result is Negative. Fact Sheet for Patients: SugarRoll.be Fact Sheet for Healthcare Providers: https://www.woods-mathews.com/ This test is not yet approved or cleared by the [place] FDA and  has been authorized for detection and/or diagnosis of SARS-CoV-2 by FDA under an Emergency Use Authorization (EUA). This EUA will remain  in effect (meaning this test can be used) for the duration of the COVID-19 declaration under Section 56 4(b)(1) of the Act, 21 U.S.C. section 360bbb-3(b)(1), unless the authorization is terminated or revoked sooner. Performed at [facility], [place] [address], [place], [place] [address]   Culture, blood (routine x 2) Call MD if unable to obtain prior to antibiotics being given     Status: None   Collection Time: 03/21/19  7:45 PM   Specimen: BLOOD  Result Value Ref Range Status   Specimen Description   Final    BLOOD RIGHT ANTECUBITAL Performed at [place] [address], [place], [place]  [address]    Special Requests   Final    BOTTLES DRAWN AEROBIC AND ANAEROBIC Blood Culture results may not be optimal due to an excessive volume of blood received in culture bottles Performed at [place] [address], [place], [place] [address]    Culture   Final    NO GROWTH 5 DAYS Performed at [facility], [place] [address] [place],  [address]    Report Status 03/27/2019 FINAL  Final  MRSA PCR Screening     Status: Abnormal   Collection Time: 03/21/19  9:09 PM   Specimen: Nasopharyngeal  Result Value Ref Range Status   MRSA by PCR POSITIVE (A) NEGATIVE Final    Comment:        The GeneXpert MRSA Assay (FDA approved for NASAL specimens only), is one component of a  comprehensive MRSA colonization surveillance program. It is not intended to diagnose MRSA infection nor to guide or monitor treatment for MRSA infections. RESULT CALLED TO, READ BACK BY AND VERIFIED WITH: DAVIS,W RN @0154  ON 03/22/2019 JACKSON,K Performed at [facility], [place] [address] [place], [place] [address]   Culture, blood (routine x 2) Call MD if unable to obtain prior to antibiotics being given     Status: None   Collection Time: 03/21/19  9:57 PM   Specimen: BLOOD  Result Value Ref Range Status   Specimen Description   Final    BLOOD RIGHT ANTECUBITAL Performed at [place] [address], [place], [place] [address]    Special Requests   Final    BOTTLES DRAWN AEROBIC AND ANAEROBIC Blood Culture adequate volume Performed at [place] [address], [place], [place] [address]    Culture   Final    NO GROWTH 5 DAYS Performed at [facility], [place] [address] [place], [place] [address]    Report Status 03/27/2019 FINAL  Final  Culture, Urine     Status: Abnormal   Collection Time: 03/23/19  8:00 AM   Specimen: Urine, Random  Result Value Ref Range Status   Specimen Description   Final     URINE, RANDOM Performed at [place] [address], [place], [place] [address]    Special Requests   Final    NONE Performed at [facility], [place] [address], [place], [place] [address]    Culture (A)  Final    <10,000 COLONIES/mL INSIGNIFICANT GROWTH Performed at [place] [address] [place], [place] [address]    Report Status 03/24/2019 FINAL  Final         Radiology Studies: Dg Chest Port 1 View  Result Date: 03/26/2019 CLINICAL DATA:  Shortness of breath. EXAM: PORTABLE CHEST 1 VIEW COMPARISON:  March 24, 2019. FINDINGS: Stable cardiomediastinal silhouette. Right lung is clear. Stable opacification of left hemithorax is noted consistent with layering pleural effusion and associated atelectasis. Stable left perihilar fibrosis is noted. No pneumothorax is noted. Bony thorax is unremarkable. IMPRESSION: Stable left lung opacity as described above. Electronically Signed   By: Marijo Conception M.D.   On: 03/26/2019 07:53        Scheduled Meds:  Chlorhexidine Gluconate Cloth  6 each Topical Daily   dexamethasone  2 mg Oral Daily   feeding supplement (ENSURE ENLIVE)  237 mL Oral TID BM   gabapentin  100 mg Oral TID   levothyroxine  12.5 mcg Oral Q0600   LORazepam  1 mg Intravenous Once   mouth rinse  15 mL Mouth Rinse BID   senna  1 tablet Oral BID   Continuous Infusions:  sodium chloride Stopped (03/25/19 0457)   sodium chloride 75 mL/hr at 03/26/19 1854     LOS: 6 days    Time spent: 32 minutes spent on chart review, discussion with nursing staff, consultants, updating family and interview/physical exam; more than 50% of that time was spent in counseling and/or coordination of care.    Patrena Santalucia J [place] DO [facility] 03/27/2019, 1:58 PM

## 2019-03-27 NOTE — Progress Notes (Signed)
Hospice care- will be discharging to residential hospice 10/29 or 10/30

## 2019-03-27 NOTE — TOC Transition Note (Signed)
Transition of Care [facility]) - CM/SW Discharge Note   Patient Details  Name: Chenelle Benning MRN: [id number] Date of Birth: 05/14/1951  Transition of Care [facility]) CM/SW Contact:  Lia Hopping, [place] Phone Number: 03/27/2019, 12:02 PM   Clinical Narrative:    CSW met with the patient and spouse at bedside. Provided clarification. Patient and spouse agreeable to move forward with residential hospice-[facility].CSW notified liaison Cheri. She will follow up with the patient spouse.    Final next level of care: [place] Barriers to Discharge: No Barriers Identified   Patient Goals and CMS Choice     Choice offered to / list presented to : NA  Discharge Placement Residential hospice.                      Discharge Plan and Services                                     Social Determinants of Health (SDOH) Interventions     Readmission Risk Interventions Readmission Risk Prevention Plan 03/26/2019 03/24/2019 03/24/2019  Transportation Screening Complete - Complete  Medication Review Press photographer) Complete - Complete  PCP or Specialist appointment within 3-5 days of discharge Complete - -  [place] or Home Care Consult - - Complete  SW Recovery Care/Counseling Consult Complete - -  Palliative Care Screening Complete Complete -  [place] Not Applicable - -  Some recent data might be hidden

## 2019-03-27 NOTE — Progress Notes (Signed)
Manufacturing engineer [facility])  Referral received for residential hospice at [facility].   Pt and chart under review by [facility] MD.  Spoke with spouse, confirmed interest, offered support.  Advised him we would call him back once a bed determination had been made.  Thank you for this referral, Venia Carbon RN, BSN, [place] (in [place]) [phone]

## 2019-03-27 NOTE — Progress Notes (Addendum)
Patient does not meet criteria to transition to  [facility].  CSW met with the patient his spouse at bedside. Patient has requested to look into additional hospice options. CSW explain to the patient her SNF stay at residential hospice will not be covered by medicare. Medicare only pays for rehab/skill days at [facility]. Spouse reports they cannot pay privately at SNF. Patient reports if she does not go to residential she will go home with hospice care.  TOC staff will continue to follow this patient.

## 2019-03-27 NOTE — Progress Notes (Signed)
OT Cancellation Note  Patient Details Name: Charley Lafrance MRN: [id number] DOB: 1951/02/10   Cancelled Treatment:    Reason Eval/Treat Not Completed: Other (comment). Plan is for residential hospice. Will sign off.  Vee Bahe 03/27/2019, 7:16 AM  Lesle Chris, OTR/L Acute Rehabilitation Services [phone] [facility] pager [phone] office 03/27/2019

## 2019-03-27 NOTE — Progress Notes (Signed)
Nutrition Follow-up  DOCUMENTATION CODES:   Not applicable  INTERVENTION:  - will d/c prostat. - will increase Ensure Enlive from BID to TID.   NUTRITION DIAGNOSIS:   Increased nutrient needs related to cancer and cancer related treatments as evidenced by estimated needs. -ongoing  GOAL:   Patient will meet greater than or equal to 90% of their needs -unmet  MONITOR:   PO intake, Supplement acceptance, Labs, Weight trends, I & O's  ASSESSMENT:   68 y.o. female with medical history significant of metastatic non-small cell lung cancer, metastatic to liver, brain on oral chemotherapy. Admitted with Tremors and right eye droopiness, increased nausea and vomiting.  Patient has not been weighed since admission (10/23). Per flow sheet documentation, she consumed 0% breakfast, 25% of lunch, and 0% of dinner on 10/27. She has accepted all bottles of Ensure since order placed on 10/25 and she has refused all but 1 packet of prostat since that time. Will make adjustments as outlined above.  Palliative Care is following patient and last charted on her this AM. Dr. Inda Castle note states that patient is awaiting transfer to residential hospice and that prognosis is likely 2-3 weeks.  Did not enter patient's room given prognosis and d/c plan.     Labs reviewed; Ca: 7.8 mg/dl. Medication reviewed; 12.5 mcg oral synthroid/day, 1 tablet senokot BID.  IVF; NS @ 75 ml/hr.      NUTRITION - FOCUSED PHYSICAL EXAM:  unable to complete at this time.   Diet Order:   Diet Order            DIET SOFT Room service appropriate? Yes; Fluid consistency: Thin  Diet effective now              EDUCATION NEEDS:   No education needs have been identified at this time  Skin:  Skin Assessment: Skin Integrity Issues: Skin Integrity Issues:: Stage II Stage II: coccyx  Last BM:  10/25  Height:   Ht Readings from Last 1 Encounters:  03/21/19 5\' 6"  (1.676 m)    Weight:   Wt Readings from Last  1 Encounters:  03/21/19 74.8 kg    Ideal Body Weight:  59.1 kg  BMI:  Body mass index is 26.63 kg/m.  Estimated Nutritional Needs:   Kcal:  2000-2200  Protein:  100-110g  Fluid:  2L/day      Jarome Matin, MS, RD, LDN, CNSC Inpatient Clinical Dietitian Pager # [phone] After hours/weekend pager # [phone]

## 2019-03-28 ENCOUNTER — Encounter (HOSPITAL_COMMUNITY): Payer: Self-pay | Admitting: *Deleted

## 2019-03-28 NOTE — TOC Transition Note (Signed)
Transition of Care [facility]) - CM/SW Discharge Note   Patient Details  Name: Debra Hodges MRN: [id number] Date of Birth: March 26, 1951  Transition of Care [facility]) CM/SW Contact:  Lynnell Catalan, RN Phone Number:[phone] 03/28/2019, 11:20 AM   Clinical Narrative:    This CM met with pt and husband at bedside for dc planning. Husband does not want [facility] as he is adamant he doesn't want a bill. They agree to home with [facility]. Liaison Cheri contacted for referral. No DME needed for home. Husband requests ambulance transport for home.   Barriers to Discharge: No Barriers Identified   Patient Goals and CMS Choice     Choice offered to / list presented to : NA  Discharge Placement                       Discharge Plan and Services                                     Social Determinants of Health (SDOH) Interventions     Readmission Risk Interventions Readmission Risk Prevention Plan 03/26/2019 03/24/2019 03/24/2019  Transportation Screening Complete - Complete  Medication Review Press photographer) Complete - Complete  PCP or Specialist appointment within 3-5 days of discharge Complete - -  [place] or [place] - - Complete  SW Recovery Care/Counseling Consult Complete - -  Palliative Care Screening Complete Complete -  [place] Not Applicable - -  Some recent data might be hidden

## 2019-03-28 NOTE — Progress Notes (Signed)
PROGRESS NOTE    Debra Hodges  IPJ:[id number] DOB: May 06, 1951 DOA: 03/21/2019 PCP: Patient, No Pcp Per   Brief Narrative:  Debra Hodges a 68 y.o.femalewith medical history significant of metastatic non-small cell lung cancer, metastatic to liver, brain on oral chemotherapy, hypertension, DVT since 2019 on Eliquis, bed bound who presented with tremors, right eye droop, nausea/vomiting.  Tremor has been ongoing for the past few weeks but since has progressed; localized right arm/right leg.  Also reports over the past week she has been unable to walk with overall gradual decline with poor oral intake.  She continues on palliative chemotherapy with Tagrisso,last dose was 4 days higher to admission.  Given her poor functional status, oncologist recommended stopping her Chemo due to ongoing nausea and vomiting.   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 Earlie Server.  Currently on treatment with palliative chemo with last dose 4 days prior to hospitalization.  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  Per medical oncology, given her poor functional status and progressive decline she is no longer a candidate for any additional chemotherapy.  Case reviewed by neuro oncology and medical oncology Dr. Mickeal Skinner and Dr. Tammi Klippel who recommended any further radiation therapy. --Palliative care following, appreciate assistance --Continue dexamethasone 2 mg p.o. daily for symptomatic relief --Supportive care  with IV fluid hydration, antiemetics --[facility] declined admission on 10/29; states "does not meet criteria"  --Social work for assistance; Residential Hospice vs Home with hospice  Sepsis, present on admission Community-acquired pneumonia in the setting of immunocompromise state Patient presenting with tachycardia, elevated lactic acid with endorgan damage with elevated lactic.  Chest x-ray notable for hazy opacity left base consistent with infiltrate versus radiation fibrosis.  Completed course of Levaquin.  Hypothyroidism TSH less than 0.010 with free T4 of 1.75.  On levothyroxine 25 mcg p.o. daily at home. --Reduce dose of levothyroxine to 12.5 mcg p.o. daily  Hypophosphatemia Patient was noted to have a phosphorus level of 1.7, repleted.  Hypomagnesemia Patient's magnesium on presentation 1.6, repleted.  Magnesium level today 2.1.  Adult failure to thrive Severe protein calorie malnutrition Prealbumin 7.9, nutrition following for further recommendations.  Continue Ensure and prostat.  Encourage increased oral intake.  Ultimately transitioning to hospice care.  Hx DVT On Eliquis at home.  Currently being held secondary to hematuria.  May not resume as patient transitioning to hospice care.  Hyperbilirubinemia Total bilirubin admission 1.5, now down to 0.6.  This is in the setting of known liver metastasis.  No further work-up at this time as patient transitioning to hospice.  Thrombocytopenia Platelet count 106 today.  Likely reduce in the setting of advanced cancer on current outpatient chemotherapy.  No need for transfusion at this time.  DVT prophylaxis: SCDs, Eliquis discontinued secondary to hematuria Code Status: DNR Family Communication: none Disposition Plan: Continue inpatient, transfer to [place], likely to transition to residential hospice versus home hospice; declined by [facility] of [facility] for inpatient admission; pending [facility] evaluation;  ultimately grim prognosis with likely no more than 2-3 weeks at this point per palliative.   Consultants:   Palliative care  Hospice  Procedures:   None  Antimicrobials:   Levofloxacin 10/23 - 10/28   Subjective: Patient seen and examined at bedside, resting comfortably.  Continues with severe debility/weakness.  Has decided to transition to residential hospice if she can.  Unfortunately was turned down by [facility] for inpatient for unclear reasons.  Husband adamant that he will not incur a bill for a residential hospice stay.  Patient without any other complaints or concerns at this time.  Denies headache, no fever/chills/night sweats, no chest pain, no palpitations, no shortness of breath, no abdominal pain.  No acute events overnight per nursing staff.  Objective: Vitals:   03/28/19 0300 03/28/19 0405 03/28/19 0555 03/28/19 0800  BP:   (!) 130/53 (!) 116/59  Pulse: (!) 101  (!) 101 96  Resp: (!) 23  18 (!) 21  Temp:  98.4 F (36.9 C)  98.4 F (36.9 C)  TempSrc:  Oral  Oral  SpO2: 97%  98% 99%  Weight:      Height:        Intake/Output Summary (Last 24 hours) at 03/28/2019 1048 Last data filed at 03/28/2019 0600 Gross per 24 hour  Intake 3017.03 ml  Output 350 ml  Net 2667.03 ml   Filed Weights   03/21/19 1354  Weight: 74.8 kg    Examination:  General exam: Appears calm and comfortable, thin/cachectic/chronically ill in appearance, pale Respiratory system: Sounds slightly decreased bilateral bases otherwise clear to auscultation. Respiratory effort normal.  Oxygenating well on room air Cardiovascular system: S1 & S2 heard, RRR. No JVD, murmurs, rubs, gallops or clicks. No pedal edema. Gastrointestinal system: Abdomen is nondistended, soft and nontender. No organomegaly or masses felt. Normal bowel sounds heard. Central nervous system: Alert and oriented. No focal neurological deficits. Extremities: Symmetric 5 x 5 power. Skin: No rashes, lesions or  ulcers Psychiatry: Judgement and insight appear normal.  Depressed mood, flat affect    Data Reviewed: I have personally reviewed following labs and imaging studies  CBC: Recent Labs  Lab 03/21/19 1359  03/23/19 0207 03/23/19 1115 03/24/19 0238 03/25/19 0148 03/26/19 0159  WBC 6.8   < > 6.4 8.1 6.9 9.7 6.8  NEUTROABS 4.6  --   --   --  6.0 7.5 5.1  HGB 11.7*   < > 8.8* 9.2* 8.9* 9.4* 8.8*  HCT 36.9   < > 28.3* 28.0* 27.0* 29.3* 27.9*  MCV 93.2   < > 94.3 91.8 91.2 91.3 94.6  PLT 186   < > 128* 141* 129* 145* 106*   < > = values in this interval not displayed.   Basic Metabolic Panel: Recent Labs  Lab 03/22/19 0149 03/23/19 0207 03/24/19 0238 03/25/19 0148 03/26/19 0159  NA 137 136 138 139 137  K 3.3* 4.0 4.5 3.9 4.0  CL 101 106 108 108 108  CO2 23 23 23 23 23   GLUCOSE 84 115* 131* 117* 111*  BUN 25* 24* 18 23 18   CREATININE 0.73 0.63 0.50 0.50 0.46  CALCIUM 8.4* 8.1* 8.1* 8.2* 7.8*  MG 1.9 1.7 1.7 1.6* 2.1  PHOS 3.0 2.1* 2.7 1.7* 3.3   GFR: Estimated Creatinine Clearance: 69.6 mL/min (by C-G formula based on SCr of 0.46 mg/dL). Liver Function Tests: Recent Labs  Lab 03/22/19 0149 03/23/19 0207 03/24/19 0238 03/25/19 0148 03/26/19 0159  AST 25 26 32 40  34  ALT 10 13 18 27 25   ALKPHOS 115 112 101 96 114  BILITOT 1.5* 1.0 0.6 0.6 0.6  PROT 5.9* 5.1* 4.8* 4.7* 4.4*  ALBUMIN 2.7* 2.4* 2.2* 2.2* 2.1*   Recent Labs  Lab 03/21/19 1359  LIPASE 28   No results for input(s): AMMONIA in the last 168 hours. Coagulation Profile: Recent Labs  Lab 03/21/19 2157  INR 1.7*   Cardiac Enzymes: Recent Labs  Lab 03/21/19 2157  CKTOTAL 27*   BNP (last 3 results) No results for input(s): PROBNP in the last 8760 hours. HbA1C: No results for input(s): HGBA1C in the last 72 hours. CBG: No results for input(s): GLUCAP in the last 168 hours. Lipid Profile: No results for input(s): CHOL, HDL, LDLCALC, TRIG, CHOLHDL, LDLDIRECT in the last 72 hours. Thyroid  Function Tests: No results for input(s): TSH, T4TOTAL, FREET4, T3FREE, THYROIDAB in the last 72 hours. Anemia Panel: No results for input(s): VITAMINB12, FOLATE, FERRITIN, TIBC, IRON, RETICCTPCT in the last 72 hours. Sepsis Labs: Recent Labs  Lab 03/21/19 1400 03/21/19 2157 03/22/19 0149  PROCALCITON  --  <0.10  --   LATICACIDVEN 2.8* 1.8 1.7    Recent Results (from the past 240 hour(s))  SARS CORONAVIRUS 2 (TAT 6-24 HRS) Nasopharyngeal Nasopharyngeal Swab     Status: None   Collection Time: 03/21/19  4:47 PM   Specimen: Nasopharyngeal Swab  Result Value Ref Range Status   SARS Coronavirus 2 NEGATIVE NEGATIVE Final    Comment: (NOTE) SARS-CoV-2 target nucleic acids are NOT DETECTED. The SARS-CoV-2 RNA is generally detectable in upper and lower respiratory specimens during the acute phase of infection. Negative results do not preclude SARS-CoV-2 infection, do not rule out co-infections with other pathogens, and should not be used as the sole basis for treatment or other patient management decisions. Negative results must be combined with clinical observations, patient history, and epidemiological information. The expected result is Negative. Fact Sheet for Patients: SugarRoll.be Fact Sheet for Healthcare Providers: https://www.woods-mathews.com/ This test is not yet approved or cleared by the [place] FDA and  has been authorized for detection and/or diagnosis of SARS-CoV-2 by FDA under an Emergency Use Authorization (EUA). This EUA will remain  in effect (meaning this test can be used) for the duration of the COVID-19 declaration under Section 56 4(b)(1) of the Act, 21 U.S.C. section 360bbb-3(b)(1), unless the authorization is terminated or revoked sooner. Performed at [facility], [place] [address], [place], [place] [address]   Culture, blood (routine x 2) Call MD if unable to obtain prior to antibiotics being given      Status: None   Collection Time: 03/21/19  7:45 PM   Specimen: BLOOD  Result Value Ref Range Status   Specimen Description   Final    BLOOD RIGHT ANTECUBITAL Performed at [facility] [address] [place], [place] [address]    Special Requests   Final    BOTTLES DRAWN AEROBIC AND ANAEROBIC Blood Culture results may not be optimal due to an excessive volume of blood received in culture bottles Performed at [facility] [address] [place], [place] [address]    Culture   Final    NO GROWTH 5 DAYS Performed at [facility], [place] [address], [place], [place] [address]    Report Status 03/27/2019 FINAL  Final  MRSA PCR Screening     Status: Abnormal   Collection Time: 03/21/19  9:09 PM   Specimen: Nasopharyngeal  Result Value Ref Range Status   MRSA by PCR POSITIVE (A) NEGATIVE Final    Comment:        The GeneXpert MRSA Assay (FDA approved for NASAL specimens only), is one component of a comprehensive MRSA colonization surveillance program. It is not intended to diagnose MRSA infection nor to guide or monitor treatment for MRSA infections. RESULT CALLED TO, READ BACK BY AND VERIFIED WITH: DAVIS,W RN @0154  ON 03/22/2019 JACKSON,K Performed at [facility], [place] [address], [place], [place] [address]   Culture, blood (routine x 2) Call MD if unable to obtain prior to antibiotics being given     Status: None   Collection Time: 03/21/19  9:57 PM   Specimen: BLOOD  Result Value Ref Range Status   Specimen Description   Final    BLOOD RIGHT ANTECUBITAL Performed at [facility] [address], [place], [place] [address]    Special Requests   Final    BOTTLES DRAWN AEROBIC AND ANAEROBIC Blood Culture adequate volume Performed at [facility] [address], [place], [place] [address]    Culture   Final    NO GROWTH 5 DAYS Performed at [facility], [place] [address], [place], [place] [address]    Report Status 03/27/2019 FINAL  Final  Culture, Urine     Status: Abnormal   Collection Time: 03/23/19  8:00 AM   Specimen: Urine, Random  Result Value Ref Range Status   Specimen Description   Final    URINE, RANDOM Performed at [facility] [address], [place], [place] [address]    Special Requests   Final    NONE Performed at [facility], [place] [address] [place], [place] [address]    Culture (A)  Final    <10,000 COLONIES/mL INSIGNIFICANT GROWTH Performed at [place] [address] [place], [place] [address]    Report Status 03/24/2019 FINAL  Final         Radiology Studies: No results found.      Scheduled Meds: . Chlorhexidine Gluconate Cloth  6 each Topical Daily  . dexamethasone  2 mg Oral Daily  . feeding supplement (ENSURE ENLIVE)  237 mL Oral TID BM  . gabapentin  100 mg Oral TID  . levothyroxine  12.5 mcg Oral Q0600  . LORazepam  1 mg Intravenous Once  . mouth rinse  15 mL Mouth Rinse BID  . senna  1 tablet Oral BID   Continuous Infusions: . sodium chloride Stopped (03/25/19 0457)  . sodium chloride Stopped (03/28/19 0325)     LOS: 7 days    Time spent: 32 minutes spent on chart review, discussion with nursing staff, consultants, updating family and interview/physical exam; more than 50% of that time was spent in counseling and/or coordination of care.    Danthony Kendrix J [place] DO [facility] 03/28/2019, 10:48 AM

## 2019-03-28 NOTE — Progress Notes (Signed)
Manufacturing engineer [facility])  Referral received for [facility].  Spoke with husband to confirm.  Discussed different levels of care offered at [facility].  Spouse adamant that he will not consent to residential hospice unless he can be guaranteed that he will not incur a bill.    Updated TOC manager.   Thank you, Venia Carbon RN, BSN, [facility] Liaison (in [place]) [phone]

## 2019-03-29 MED ORDER — GABAPENTIN 100 MG PO CAPS: 100.0000 mg | ORAL_CAPSULE | Freq: Three times a day (TID) | ORAL | 0 refills | Status: AC

## 2019-03-29 MED ORDER — MORPHINE SULFATE 20 MG/5ML PO SOLN: 10.0000 mg | ORAL | 0 refills | Status: AC | PRN

## 2019-03-29 MED ORDER — SENNA 8.6 MG PO TABS: 1.0000 | ORAL_TABLET | Freq: Two times a day (BID) | ORAL | 0 refills | Status: AC

## 2019-03-29 MED ORDER — DEXAMETHASONE 2 MG PO TABS: 2.0000 mg | ORAL_TABLET | Freq: Every day | ORAL | 0 refills | Status: AC

## 2019-03-29 MED ORDER — ONDANSETRON HCL 4 MG PO TABS: 4.0000 mg | ORAL_TABLET | Freq: Four times a day (QID) | ORAL | 0 refills | Status: AC | PRN

## 2019-03-29 NOTE — Discharge Summary (Signed)
Physician Discharge Summary  Debra Hodges OFB:[id number] DOB: [birth year] DOA: 03/21/2019  PCP: Patient, No Pcp Per  Admit date: 03/21/2019 Discharge date: 03/29/2019  Admitted From: Home Disposition: Home with hospice  Recommendations for Outpatient Follow-up:  1. Follow up with hospice provider as needed  Home Health: No Equipment/Devices: None  Discharge Condition: Hospice CODE STATUS: Comfort care, DNR Diet recommendation: Comfort feeds as tolerates  History of present illness:  Debra Hodges a 68 y.o.femalewith medical history significant of metastatic non-small cell lung cancer, metastatic to liver, brain on oral chemotherapy, hypertension, DVT since 2019 on Eliquis, bed bound who presented with tremors, right eye droop, nausea/vomiting.  Tremor has been ongoing for the past few weeks but since has progressed; localized right arm/right leg.  Also reports over the past week she has been unable to walk with overall gradual decline with poor oral intake.  She continues on palliative chemotherapy with Tagrisso,last dose was 4 days higher to admission.  Given her poor functional status, oncologist recommended stopping her Chemo due to ongoing nausea and vomiting.   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 Earlie Server.  Currently on treatment with palliative chemo with last dose 4 days prior to hospitalization.  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  Per medical oncology, given her poor functional status and progressive decline she is no longer a candidate for any additional chemotherapy.  Case reviewed by neuro oncology and medical oncology Dr. Mickeal Skinner and Dr. Tammi Klippel who recommended any further radiation therapy.  Healthcare was consulted and followed during hospital course.   Patient was continued on dexamethasone 2 mg p.o. daily for symptomatic relief of her multiple brain metastases.  Patient is reasonable for inpatient hospice care, although hospice of [facility] declined admission on 03/27/2019; as their medical director stated "does not meet criteria".  Patient now discharging home with home hospice services.  Pain/anxiety/dyspnea control with oral morphine.  Sepsis, present on admission Community-acquired pneumonia in the setting of immunocompromise state Patient presenting with tachycardia, elevated lactic acid with endorgan damage with elevated lactic.  Chest x-ray notable for hazy opacity left base consistent with infiltrate versus radiation fibrosis.  Completed course of Levaquin while inpatient.   Hypothyroidism Continue home levothyroxine  Hypophosphatemia Patient was noted to have a phosphorus level of 1.7, repleted.  Hypomagnesemia Patient's magnesium on presentation 1.6, repleted.  Magnesium level today 2.1.  Adult failure to thrive Severe protein calorie malnutrition Prealbumin 7.9, nutrition following for further recommendations.  Continue Ensure and prostat.  Encourage increased oral intake.  Ultimately transitioning to hospice care.  Hx DVT On Eliquis at home.  Currently being held secondary to hematuria.    Not resuming as patient transitioning to hospice care.  Hyperbilirubinemia Total bilirubin admission 1.5, now down to 0.6.  This is in the setting of known liver metastasis.  No further work-up at this time as patient transitioning to hospice.  Thrombocytopenia Platelet count 106 today.  Likely reduce in the setting of advanced cancer on current outpatient chemotherapy.  No need for transfusion at this time.  Discharge Diagnoses:  Active Problems:   Non-small cell carcinoma of lung, stage 4 (HCC)   Metastases to the liver [facility])   Hypothyroidism   Non-small cell lung cancer with metastasis ([place])   Nausea and vomiting    Dehydration   Brain metastasis ([place])  Pressure injury of skin    Discharge Instructions  Discharge Instructions    Diet - low sodium heart healthy   Complete by: As directed    Increase activity slowly   Complete by: As directed      Allergies as of 03/29/2019      Reactions   Peanut-containing Drug Products    Excessive mucous   Penicillins    Hives, Childhood Allergy Has patient had a PCN reaction causing immediate rash, facial/tongue/throat swelling, SOB or lightheadedness with hypotension: Yes Has patient had a PCN reaction causing severe rash involving mucus membranes or skin necrosis: No Has patient had a PCN reaction that required hospitalization: No Has patient had a PCN reaction occurring within the last 10 years: No If all of the above answers are "NO", then may proceed with Cephalosporin use.      Medication List    STOP taking these medications   calcium carbonate 600 MG Tabs tablet Commonly known as: OS-CAL   Eliquis 5 MG Tabs tablet Generic drug: apixaban   feeding supplement (PRO-STAT SUGAR FREE 64) Liqd   osimertinib mesylate 80 MG tablet Commonly known as: Tagrisso   potassium chloride SA 20 MEQ tablet Commonly known as: KLOR-CON   spironolactone 25 MG tablet Commonly known as: ALDACTONE   vitamin C 500 MG tablet Commonly known as: ASCORBIC ACID     TAKE these medications   acetaminophen 325 MG tablet Commonly known as: TYLENOL Take 650 mg by mouth every 6 (six) hours as needed for mild pain. Reported on 06/15/2015   dexamethasone 2 MG tablet Commonly known as: DECADRON Take 1 tablet (2 mg total) by mouth daily. Start taking on: March 30, 2019   feeding supplement (ENSURE ENLIVE) Liqd Take 237 mLs by mouth 2 (two) times daily between meals.   gabapentin 100 MG capsule Commonly known as: NEURONTIN Take 1 capsule (100 mg total) by mouth 3 (three) times daily. What changed: See the new instructions.   levothyroxine 25 MCG  tablet Commonly known as: SYNTHROID Take 1 tablet (25 mcg total) by mouth at bedtime. What changed: when to take this   loperamide 2 MG capsule Commonly known as: IMODIUM Take 2 mg by mouth daily as needed for diarrhea or loose stools. Reported on 11/22/2015   morphine 20 MG/5ML solution Take 2.5 mLs (10 mg total) by mouth every 3 (three) hours as needed for pain (Dyspnea, anxiety).   ondansetron 4 MG tablet Commonly known as: ZOFRAN Take 1 tablet (4 mg total) by mouth every 6 (six) hours as needed for nausea.   prochlorperazine 10 MG tablet Commonly known as: COMPAZINE Take 10 mg by mouth every 6 (six) hours as needed for nausea or vomiting.   senna 8.6 MG Tabs tablet Commonly known as: SENOKOT Take 1 tablet (8.6 mg total) by mouth 2 (two) times daily.       Allergies  Allergen Reactions  . Peanut-Containing Drug Products     Excessive mucous  . Penicillins     Hives, Childhood Allergy Has patient had a PCN reaction causing immediate rash, facial/tongue/throat swelling, SOB or lightheadedness with hypotension: Yes Has patient had a PCN reaction causing severe rash involving mucus membranes or skin necrosis: No Has patient had a PCN reaction that required hospitalization: No Has patient had a PCN reaction occurring within the last 10 years: No If all of the above answers are "NO", then may proceed with Cephalosporin use.     Consultations:  Palliative care  Medical  oncology/radiation oncology  Hospice   Procedures/Studies: Mr Jeri Cos And Wo Contrast  Result Date: 03/21/2019 CLINICAL DATA:  Metastatic lung cancer.  Post whole-brain radiation. EXAM: MRI HEAD WITHOUT AND WITH CONTRAST TECHNIQUE: Multiplanar, multiecho pulse sequences of the brain and surrounding structures were obtained without and with intravenous contrast. CONTRAST:  8mL GADAVIST GADOBUTROL 1 MMOL/ML IV SOLN COMPARISON:  MRI head 11/18/2018 FINDINGS: Brain: Known metastatic disease to the brain.  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 Leptomeningeal enhancement right occipital lobe stable, axial image 66 5 mm enhancing nodule posterior splenium of corpus callosum is new 5 x 8 mm enhancing nodule right posterior parietal lobe has progressed in the interval. Axial image 97 Punctate enhancement right posterior frontal lobe axial image 113 unchanged. Punctate calcification left medial frontal lobe unchanged axial image 112 Leptomeningeal enhancement right frontal convexity unchanged. Generalized atrophy. Mild ventricular enlargement. Mild white matter changes have progressed since the prior study. Progressive FLAIR hyperintensity right temporal lobe without restricted diffusion or enhancement. Possible radiation change. Negative for acute infarct Vascular: Normal arterial flow voids Skull and upper cervical spine: No focal skeletal lesions. Sinuses/Orbits: Mild mucosal edema paranasal sinuses.  Normal orbit Other: None IMPRESSION: New metastatic deposit in the posterior splenium corpus callosum. Progression of metastatic disease right posterior parietal lobe. Multiple additional treated lesions are stable in the brain. Progressive white matter changes. Progressive FLAIR hyperintensity in the right temporal lobe. These findings are likely due to treatment. Electronically Signed   By: Franchot Gallo M.D.   On: 03/21/2019 15:54   Dg Chest Port 1 View  Result Date: 03/26/2019 CLINICAL DATA:  Shortness of breath. EXAM: PORTABLE CHEST 1 VIEW COMPARISON:  March 24, 2019. FINDINGS: Stable cardiomediastinal silhouette. Right lung is clear. Stable opacification of left hemithorax is noted consistent with layering pleural effusion and associated atelectasis. Stable left perihilar fibrosis is noted. No pneumothorax is noted. Bony thorax is unremarkable. IMPRESSION: Stable left lung opacity as described above. Electronically  Signed   By: Marijo Conception M.D.   On: 03/26/2019 07:53   Dg Chest Port 1 View  Result Date: 03/24/2019 CLINICAL DATA:  Shortness of breath.  Treated lung cancer EXAM: PORTABLE CHEST 1 VIEW COMPARISON:  Yesterday FINDINGS: Stable volume loss and perihilar opacity on the left attributed to fibrosis based on staging chest CT 12/12/2018. There is haziness of the left chest with obscured left diaphragm, possible pleural effusion. The right lung is clear. Normal heart size. IMPRESSION: 1. Stable compared to yesterday. 2. Hazy opacity at the left base that could be pleural fluid and/or infiltrate. 3. Left perihilar radiation fibrosis. Electronically Signed   By: Monte Fantasia M.D.   On: 03/24/2019 06:33   Dg Chest Port 1 View  Result Date: 03/23/2019 CLINICAL DATA:  Shortness of breath EXAM: PORTABLE CHEST 1 VIEW COMPARISON:  March 21, 2019 FINDINGS: Opacity with volume loss and fibrosis in the left upper lobe persists, likely indicative of radiation therapy change. The ill-defined opacity in the left base remains stable. The right lung remains clear. Heart size is normal. The pulmonary vascularity on the right is normal. The pulmonary vascularity on the left remains somewhat distorted due to the apparent radiation therapy change. No adenopathy evident. No bone lesions. IMPRESSION: Stable chest compared to recent prior study. Apparent radiation therapy change with volume loss and scarring left upper lobe. Ill-defined opacity left base, suspicious for pneumonia. No new opacity. Stable cardiac silhouette. Electronically  Signed   By: Lowella Grip III M.D.   On: 03/23/2019 13:50   Dg Chest Portable 1 View  Result Date: 03/21/2019 CLINICAL DATA:  Weakness and tremor. History of stage IV lung carcinoma EXAM: PORTABLE CHEST 1 VIEW COMPARISON:  September 20, 2018 chest radiograph and chest CT December 12, 2018 FINDINGS: There is volume loss and fibrosis in the left upper lobe, likely due to previous radiation  therapy change. This appearance is stable. There is airspace opacity in the left base, concerning for pneumonia. Right lung is clear. Heart size is normal. Pulmonary vascular on the right is normal. There is distortion of pulmonary vascularity on the left due to the apparent post radiation therapy change. No adenopathy is appreciable by radiography. No bone lesions. IMPRESSION: 1.Apparent scarring and fibrosis in the left upper lobe medially with changes felt to be secondary to post radiation therapy. 2.  Airspace opacity consistent with pneumonia left base. 3.  Right lung clear. 4. Stable cardiac silhouette. No adenopathy appreciable by radiography. Electronically Signed   By: Lowella Grip III M.D.   On: 03/21/2019 15:57      Subjective: Patient seen and examined bedside, resting comfortably and sleeping but easily arousable.  States continues with severe fatigue.  Otherwise no other complaints.  Ready to discharge home with family today.  No acute events overnight per nursing staff.   Discharge Exam: Vitals:   03/28/19 2000 03/28/19 2205  BP: 123/60 127/72  Pulse: 85 (!) 103  Resp: 17 16  Temp:  97.8 F (36.6 C)  SpO2: 97% 98%   Vitals:   03/28/19 1700 03/28/19 1958 03/28/19 2000 03/28/19 2205  BP:   123/60 127/72  Pulse: (!) 107  85 (!) 103  Resp: (!) 21  17 16   Temp:  98.8 F (37.1 C)  97.8 F (36.6 C)  TempSrc:  Oral  Oral  SpO2: 96%  97% 98%  Weight:      Height:        General exam: Appears calm and comfortable, thin/cachectic/chronically ill in appearance, pale Respiratory system: Sounds slightly decreased bilateral bases otherwise clear to auscultation. Respiratory effort normal.  Oxygenating well on room air Cardiovascular system: S1 & S2 heard, RRR. No JVD, murmurs, rubs, gallops or clicks. No pedal edema. Gastrointestinal system: Abdomen is nondistended, soft and nontender. No organomegaly or masses felt. Normal bowel sounds heard. Central nervous system: Alert  and oriented. No focal neurological deficits. Extremities: Symmetric 5 x 5 power. Skin: No rashes, lesions or ulcers Psychiatry: Judgement and insight appear normal.  Depressed mood, flat affect    The results of significant diagnostics from this hospitalization (including imaging, microbiology, ancillary and laboratory) are listed below for reference.     Microbiology: Recent Results (from the past 240 hour(s))  SARS CORONAVIRUS 2 (TAT 6-24 HRS) Nasopharyngeal Nasopharyngeal Swab     Status: None   Collection Time: 03/21/19  4:47 PM   Specimen: Nasopharyngeal Swab  Result Value Ref Range Status   SARS Coronavirus 2 NEGATIVE NEGATIVE Final    Comment: (NOTE) SARS-CoV-2 target nucleic acids are NOT DETECTED. The SARS-CoV-2 RNA is generally detectable in upper and lower respiratory specimens during the acute phase of infection. Negative results do not preclude SARS-CoV-2 infection, do not rule out co-infections with other pathogens, and should not be used as the sole basis for treatment or other patient management decisions. Negative results must be combined with clinical observations, patient history, and epidemiological information. The expected result is Negative. Fact Sheet  for Patients: SugarRoll.be Fact Sheet for Healthcare Providers: https://www.woods-mathews.com/ This test is not yet approved or cleared by the [place] FDA and  has been authorized for detection and/or diagnosis of SARS-CoV-2 by FDA under an Emergency Use Authorization (EUA). This EUA will remain  in effect (meaning this test can be used) for the duration of the COVID-19 declaration under Section 56 4(b)(1) of the Act, 21 U.S.C. section 360bbb-3(b)(1), unless the authorization is terminated or revoked sooner. Performed at [facility], [place] [address] [place], [place] [address]   Culture, blood (routine x 2) Call MD if unable to obtain prior to  antibiotics being given     Status: None   Collection Time: 03/21/19  7:45 PM   Specimen: BLOOD  Result Value Ref Range Status   Specimen Description   Final    BLOOD RIGHT ANTECUBITAL Performed at [place] [address], [place], [place] [address]    Special Requests   Final    BOTTLES DRAWN AEROBIC AND ANAEROBIC Blood Culture results may not be optimal due to an excessive volume of blood received in culture bottles Performed at [place] [address] [place], [place] [address]    Culture   Final    NO GROWTH 5 DAYS Performed at [facility], [place] [address], [place], [place] [address]    Report Status 03/27/2019 FINAL  Final  MRSA PCR Screening     Status: Abnormal   Collection Time: 03/21/19  9:09 PM   Specimen: Nasopharyngeal  Result Value Ref Range Status   MRSA by PCR POSITIVE (A) NEGATIVE Final    Comment:        The GeneXpert MRSA Assay (FDA approved for NASAL specimens only), is one component of a comprehensive MRSA colonization surveillance program. It is not intended to diagnose MRSA infection nor to guide or monitor treatment for MRSA infections. RESULT CALLED TO, READ BACK BY AND VERIFIED WITH: DAVIS,W RN @0154  ON 03/22/2019 JACKSON,K Performed at [facility], [place] [address], [place], [place] [address]   Culture, blood (routine x 2) Call MD if unable to obtain prior to antibiotics being given     Status: None   Collection Time: 03/21/19  9:57 PM   Specimen: BLOOD  Result Value Ref Range Status   Specimen Description   Final    BLOOD RIGHT ANTECUBITAL Performed at [place] [address] [place], [place] [address]    Special Requests   Final    BOTTLES DRAWN AEROBIC AND ANAEROBIC Blood Culture adequate volume Performed at [place] [address] [place], [place] [address]    Culture   Final    NO GROWTH 5 DAYS Performed at [facility], [place] [address] [place], [place] [address]    Report Status 03/27/2019 FINAL  Final  Culture, Urine     Status: Abnormal   Collection Time: 03/23/19  8:00 AM   Specimen: Urine, Random  Result Value Ref Range Status   Specimen Description   Final    URINE, RANDOM Performed at [place] [address] [place], [place] [address]    Special Requests   Final    NONE Performed at [facility], [place] [address], [place], [place] [address]    Culture (A)  Final    <10,000 COLONIES/mL INSIGNIFICANT GROWTH Performed at [place] [address] [place], [place]  [address]    Report Status 03/24/2019 FINAL  Final     Labs: BNP (last 3 results) No results for input(s): BNP in the last 8760 hours. Basic Metabolic Panel: Recent Labs  Lab 03/23/19 0207 03/24/19 0238 03/25/19 0148 03/26/19 0159  NA 136 138 139 137  K 4.0 4.5 3.9 4.0  CL 106 108 108 108  CO2 23 23 23 23   GLUCOSE 115* 131* 117* 111*  BUN 24* 18 23 18   CREATININE 0.63 0.50 0.50 0.46  CALCIUM 8.1* 8.1* 8.2* 7.8*  MG 1.7 1.7 1.6* 2.1  PHOS 2.1* 2.7 1.7* 3.3   Liver Function Tests: Recent Labs  Lab 03/23/19 0207 03/24/19 0238 03/25/19 0148 03/26/19 0159  AST 26 32 40 34  ALT 13 18 27 25   ALKPHOS 112 101 96 114  BILITOT 1.0 0.6 0.6 0.6  PROT 5.1* 4.8* 4.7* 4.4*  ALBUMIN 2.4* 2.2* 2.2* 2.1*   No results for input(s): LIPASE, AMYLASE in the last 168 hours. No results for input(s): AMMONIA in the last 168 hours. CBC: Recent Labs  Lab 03/23/19 0207 03/23/19 1115 03/24/19 0238 03/25/19 0148 03/26/19 0159  WBC 6.4 8.1 6.9 9.7 6.8  NEUTROABS  --   --  6.0 7.5 5.1  HGB 8.8* 9.2* 8.9* 9.4* 8.8*  HCT 28.3* 28.0* 27.0* 29.3* 27.9*  MCV 94.3 91.8 91.2 91.3 94.6  PLT 128* 141* 129* 145* 106*   Cardiac Enzymes: No results for input(s): CKTOTAL, CKMB, CKMBINDEX, TROPONINI in the last 168 hours. BNP: Invalid input(s): POCBNP CBG: No results for  input(s): GLUCAP in the last 168 hours. D-Dimer No results for input(s): DDIMER in the last 72 hours. Hgb A1c No results for input(s): HGBA1C in the last 72 hours. Lipid Profile No results for input(s): CHOL, HDL, LDLCALC, TRIG, CHOLHDL, LDLDIRECT in the last 72 hours. Thyroid function studies No results for input(s): TSH, T4TOTAL, T3FREE, THYROIDAB in the last 72 hours.  Invalid input(s): FREET3 Anemia work up No results for input(s): VITAMINB12, FOLATE, FERRITIN, TIBC, IRON, RETICCTPCT in the last 72 hours. Urinalysis    Component Value Date/Time   COLORURINE YELLOW 03/23/2019 0800   APPEARANCEUR HAZY (A) 03/23/2019 0800   LABSPEC 1.020 03/23/2019 0800   PHURINE 6.0 03/23/2019 0800   GLUCOSEU NEGATIVE 03/23/2019 0800   HGBUR LARGE (A) 03/23/2019 0800   BILIRUBINUR NEGATIVE 03/23/2019 0800   KETONESUR 20 (A) 03/23/2019 0800   PROTEINUR 100 (A) 03/23/2019 0800   NITRITE NEGATIVE 03/23/2019 0800   LEUKOCYTESUR NEGATIVE 03/23/2019 0800   Sepsis Labs Invalid input(s): PROCALCITONIN,  WBC,  LACTICIDVEN Microbiology Recent Results (from the past 240 hour(s))  SARS CORONAVIRUS 2 (TAT 6-24 HRS) Nasopharyngeal Nasopharyngeal Swab     Status: None   Collection Time: 03/21/19  4:47 PM   Specimen: Nasopharyngeal Swab  Result Value Ref Range Status   SARS Coronavirus 2 NEGATIVE NEGATIVE Final    Comment: (NOTE) SARS-CoV-2 target nucleic acids are NOT DETECTED. The SARS-CoV-2 RNA is generally detectable in upper and lower respiratory specimens during the acute phase of infection. Negative results do not preclude SARS-CoV-2 infection, do not rule out co-infections with other pathogens, and should not be used as the sole basis for treatment or other patient management decisions. Negative results must be combined with clinical observations, patient history, and epidemiological information. The expected result is Negative. Fact Sheet for  Patients: SugarRoll.be Fact Sheet for Healthcare Providers: https://www.woods-mathews.com/ This test is not yet approved or cleared by the [place] and  has been authorized  for detection and/or diagnosis of SARS-CoV-2 by FDA under an Emergency Use Authorization (EUA). This EUA will remain  in effect (meaning this test can be used) for the duration of the COVID-19 declaration under Section 56 4(b)(1) of the Act, 21 U.S.C. section 360bbb-3(b)(1), unless the authorization is terminated or revoked sooner. Performed at [facility], [place] [address] [place], [place] [address]   Culture, blood (routine x 2) Call MD if unable to obtain prior to antibiotics being given     Status: None   Collection Time: 03/21/19  7:45 PM   Specimen: BLOOD  Result Value Ref Range Status   Specimen Description   Final    BLOOD RIGHT ANTECUBITAL Performed at [place] [address], [place], [place] [address]    Special Requests   Final    BOTTLES DRAWN AEROBIC AND ANAEROBIC Blood Culture results may not be optimal due to an excessive volume of blood received in culture bottles Performed at [place] [address] [place], [place] [address]    Culture   Final    NO GROWTH 5 DAYS Performed at [facility], [place] [address] [place], [place] [address]    Report Status 03/27/2019 FINAL  Final  MRSA PCR Screening     Status: Abnormal   Collection Time: 03/21/19  9:09 PM   Specimen: Nasopharyngeal  Result Value Ref Range Status   MRSA by PCR POSITIVE (A) NEGATIVE Final    Comment:        The GeneXpert MRSA Assay (FDA approved for NASAL specimens only), is one component of a comprehensive MRSA colonization surveillance program. It is not intended to diagnose MRSA infection nor to guide or monitor treatment for MRSA infections. RESULT CALLED TO, READ BACK BY AND VERIFIED WITH: DAVIS,W RN @0154  ON  03/22/2019 JACKSON,K Performed at [facility], [place] [address] [place], [place] [address]   Culture, blood (routine x 2) Call MD if unable to obtain prior to antibiotics being given     Status: None   Collection Time: 03/21/19  9:57 PM   Specimen: BLOOD  Result Value Ref Range Status   Specimen Description   Final    BLOOD RIGHT ANTECUBITAL Performed at [place] [address], [place], [place] [address]    Special Requests   Final    BOTTLES DRAWN AEROBIC AND ANAEROBIC Blood Culture adequate volume Performed at [place] [address], [place], [place] [address]    Culture   Final    NO GROWTH 5 DAYS Performed at [facility], [place] [address] [place], [place] [address]    Report Status 03/27/2019 FINAL  Final  Culture, Urine     Status: Abnormal   Collection Time: 03/23/19  8:00 AM   Specimen: Urine, Random  Result Value Ref Range Status   Specimen Description   Final    URINE, RANDOM Performed at [place] [address] [place], [place] [address]    Special Requests   Final    NONE Performed at [facility], [place] [address], [place], [place] [address]    Culture (A)  Final    <10,000 COLONIES/mL INSIGNIFICANT GROWTH Performed at [facility] [address], [place], [place] [address]    Report Status 03/24/2019 FINAL  Final     Time coordinating discharge: Over 30 minutes  SIGNED:   Aleea Hendry J [place] DO  [facility] 03/29/2019, 10:13 AM

## 2019-03-30 DIAGNOSIS — C787 Secondary malignant neoplasm of liver and intrahepatic bile duct: Secondary | ICD-10-CM | POA: Diagnosis not present

## 2019-03-30 DIAGNOSIS — Z86718 Personal history of other venous thrombosis and embolism: Secondary | ICD-10-CM | POA: Diagnosis not present

## 2019-03-30 DIAGNOSIS — I10 Essential (primary) hypertension: Secondary | ICD-10-CM | POA: Diagnosis not present

## 2019-03-30 DIAGNOSIS — C3492 Malignant neoplasm of unspecified part of left bronchus or lung: Secondary | ICD-10-CM | POA: Diagnosis not present

## 2019-03-30 DIAGNOSIS — E039 Hypothyroidism, unspecified: Secondary | ICD-10-CM | POA: Diagnosis not present

## 2019-03-30 DIAGNOSIS — D696 Thrombocytopenia, unspecified: Secondary | ICD-10-CM | POA: Diagnosis not present

## 2019-03-30 DIAGNOSIS — E43 Unspecified severe protein-calorie malnutrition: Secondary | ICD-10-CM | POA: Diagnosis not present

## 2019-03-30 DIAGNOSIS — C7931 Secondary malignant neoplasm of brain: Secondary | ICD-10-CM | POA: Diagnosis not present

## 2019-03-31 DIAGNOSIS — E43 Unspecified severe protein-calorie malnutrition: Secondary | ICD-10-CM | POA: Diagnosis not present

## 2019-03-31 DIAGNOSIS — Z86718 Personal history of other venous thrombosis and embolism: Secondary | ICD-10-CM | POA: Diagnosis not present

## 2019-03-31 DIAGNOSIS — C3492 Malignant neoplasm of unspecified part of left bronchus or lung: Secondary | ICD-10-CM | POA: Diagnosis not present

## 2019-03-31 DIAGNOSIS — C787 Secondary malignant neoplasm of liver and intrahepatic bile duct: Secondary | ICD-10-CM | POA: Diagnosis not present

## 2019-03-31 DIAGNOSIS — D696 Thrombocytopenia, unspecified: Secondary | ICD-10-CM | POA: Diagnosis not present

## 2019-03-31 DIAGNOSIS — C7931 Secondary malignant neoplasm of brain: Secondary | ICD-10-CM | POA: Diagnosis not present

## 2019-04-01 DIAGNOSIS — E43 Unspecified severe protein-calorie malnutrition: Secondary | ICD-10-CM | POA: Diagnosis not present

## 2019-04-01 DIAGNOSIS — Z86718 Personal history of other venous thrombosis and embolism: Secondary | ICD-10-CM | POA: Diagnosis not present

## 2019-04-01 DIAGNOSIS — D696 Thrombocytopenia, unspecified: Secondary | ICD-10-CM | POA: Diagnosis not present

## 2019-04-01 DIAGNOSIS — C3492 Malignant neoplasm of unspecified part of left bronchus or lung: Secondary | ICD-10-CM | POA: Diagnosis not present

## 2019-04-01 DIAGNOSIS — C7931 Secondary malignant neoplasm of brain: Secondary | ICD-10-CM | POA: Diagnosis not present

## 2019-04-01 DIAGNOSIS — C787 Secondary malignant neoplasm of liver and intrahepatic bile duct: Secondary | ICD-10-CM | POA: Diagnosis not present

## 2019-04-03 DIAGNOSIS — C787 Secondary malignant neoplasm of liver and intrahepatic bile duct: Secondary | ICD-10-CM | POA: Diagnosis not present

## 2019-04-03 DIAGNOSIS — D696 Thrombocytopenia, unspecified: Secondary | ICD-10-CM | POA: Diagnosis not present

## 2019-04-03 DIAGNOSIS — C7931 Secondary malignant neoplasm of brain: Secondary | ICD-10-CM | POA: Diagnosis not present

## 2019-04-03 DIAGNOSIS — Z86718 Personal history of other venous thrombosis and embolism: Secondary | ICD-10-CM | POA: Diagnosis not present

## 2019-04-03 DIAGNOSIS — E43 Unspecified severe protein-calorie malnutrition: Secondary | ICD-10-CM | POA: Diagnosis not present

## 2019-04-03 DIAGNOSIS — C3492 Malignant neoplasm of unspecified part of left bronchus or lung: Secondary | ICD-10-CM | POA: Diagnosis not present

## 2019-04-04 DIAGNOSIS — C7931 Secondary malignant neoplasm of brain: Secondary | ICD-10-CM | POA: Diagnosis not present

## 2019-04-04 DIAGNOSIS — Z86718 Personal history of other venous thrombosis and embolism: Secondary | ICD-10-CM | POA: Diagnosis not present

## 2019-04-04 DIAGNOSIS — E43 Unspecified severe protein-calorie malnutrition: Secondary | ICD-10-CM | POA: Diagnosis not present

## 2019-04-04 DIAGNOSIS — C3492 Malignant neoplasm of unspecified part of left bronchus or lung: Secondary | ICD-10-CM | POA: Diagnosis not present

## 2019-04-04 DIAGNOSIS — C787 Secondary malignant neoplasm of liver and intrahepatic bile duct: Secondary | ICD-10-CM | POA: Diagnosis not present

## 2019-04-04 DIAGNOSIS — D696 Thrombocytopenia, unspecified: Secondary | ICD-10-CM | POA: Diagnosis not present

## 2019-04-07 DIAGNOSIS — C7931 Secondary malignant neoplasm of brain: Secondary | ICD-10-CM | POA: Diagnosis not present

## 2019-04-07 DIAGNOSIS — Z86718 Personal history of other venous thrombosis and embolism: Secondary | ICD-10-CM | POA: Diagnosis not present

## 2019-04-07 DIAGNOSIS — E43 Unspecified severe protein-calorie malnutrition: Secondary | ICD-10-CM | POA: Diagnosis not present

## 2019-04-07 DIAGNOSIS — C787 Secondary malignant neoplasm of liver and intrahepatic bile duct: Secondary | ICD-10-CM | POA: Diagnosis not present

## 2019-04-07 DIAGNOSIS — D696 Thrombocytopenia, unspecified: Secondary | ICD-10-CM | POA: Diagnosis not present

## 2019-04-07 DIAGNOSIS — C3492 Malignant neoplasm of unspecified part of left bronchus or lung: Secondary | ICD-10-CM | POA: Diagnosis not present

## 2019-04-09 DIAGNOSIS — Z86718 Personal history of other venous thrombosis and embolism: Secondary | ICD-10-CM | POA: Diagnosis not present

## 2019-04-09 DIAGNOSIS — E43 Unspecified severe protein-calorie malnutrition: Secondary | ICD-10-CM | POA: Diagnosis not present

## 2019-04-09 DIAGNOSIS — C7931 Secondary malignant neoplasm of brain: Secondary | ICD-10-CM | POA: Diagnosis not present

## 2019-04-09 DIAGNOSIS — C787 Secondary malignant neoplasm of liver and intrahepatic bile duct: Secondary | ICD-10-CM | POA: Diagnosis not present

## 2019-04-09 DIAGNOSIS — C3492 Malignant neoplasm of unspecified part of left bronchus or lung: Secondary | ICD-10-CM | POA: Diagnosis not present

## 2019-04-09 DIAGNOSIS — D696 Thrombocytopenia, unspecified: Secondary | ICD-10-CM | POA: Diagnosis not present

## 2019-04-11 DIAGNOSIS — C7931 Secondary malignant neoplasm of brain: Secondary | ICD-10-CM | POA: Diagnosis not present

## 2019-04-11 DIAGNOSIS — C3492 Malignant neoplasm of unspecified part of left bronchus or lung: Secondary | ICD-10-CM | POA: Diagnosis not present

## 2019-04-11 DIAGNOSIS — Z86718 Personal history of other venous thrombosis and embolism: Secondary | ICD-10-CM | POA: Diagnosis not present

## 2019-04-11 DIAGNOSIS — C787 Secondary malignant neoplasm of liver and intrahepatic bile duct: Secondary | ICD-10-CM | POA: Diagnosis not present

## 2019-04-11 DIAGNOSIS — E43 Unspecified severe protein-calorie malnutrition: Secondary | ICD-10-CM | POA: Diagnosis not present

## 2019-04-11 DIAGNOSIS — D696 Thrombocytopenia, unspecified: Secondary | ICD-10-CM | POA: Diagnosis not present

## 2019-04-14 DIAGNOSIS — E43 Unspecified severe protein-calorie malnutrition: Secondary | ICD-10-CM | POA: Diagnosis not present

## 2019-04-14 DIAGNOSIS — Z86718 Personal history of other venous thrombosis and embolism: Secondary | ICD-10-CM | POA: Diagnosis not present

## 2019-04-14 DIAGNOSIS — D696 Thrombocytopenia, unspecified: Secondary | ICD-10-CM | POA: Diagnosis not present

## 2019-04-14 DIAGNOSIS — C787 Secondary malignant neoplasm of liver and intrahepatic bile duct: Secondary | ICD-10-CM | POA: Diagnosis not present

## 2019-04-14 DIAGNOSIS — C7931 Secondary malignant neoplasm of brain: Secondary | ICD-10-CM | POA: Diagnosis not present

## 2019-04-14 DIAGNOSIS — C3492 Malignant neoplasm of unspecified part of left bronchus or lung: Secondary | ICD-10-CM | POA: Diagnosis not present

## 2019-04-15 ENCOUNTER — Telehealth: Payer: Self-pay | Admitting: *Deleted

## 2019-04-15 DIAGNOSIS — D696 Thrombocytopenia, unspecified: Secondary | ICD-10-CM | POA: Diagnosis not present

## 2019-04-15 DIAGNOSIS — Z86718 Personal history of other venous thrombosis and embolism: Secondary | ICD-10-CM | POA: Diagnosis not present

## 2019-04-15 DIAGNOSIS — C787 Secondary malignant neoplasm of liver and intrahepatic bile duct: Secondary | ICD-10-CM | POA: Diagnosis not present

## 2019-04-15 DIAGNOSIS — C3492 Malignant neoplasm of unspecified part of left bronchus or lung: Secondary | ICD-10-CM | POA: Diagnosis not present

## 2019-04-15 DIAGNOSIS — C7931 Secondary malignant neoplasm of brain: Secondary | ICD-10-CM | POA: Diagnosis not present

## 2019-04-15 DIAGNOSIS — E43 Unspecified severe protein-calorie malnutrition: Secondary | ICD-10-CM | POA: Diagnosis not present

## 2019-04-15 NOTE — Telephone Encounter (Signed)
Pt spouse called to advise pt was taken to Hospice yesterday. [facility] is still sending pt Tagris[place], Called [facility] advised pt not taking tagrisso, new shipment was received and pt has gone to hospice. S/w rep from [facility] she advised she will mail pt an envelope with return address and instructions to send [email or web address].back to [facility]. Requested if [facility] would call pt to advise and give instructions. Representative declined stating, a envelope with instructions will be mail out to pt.  Call placed to pt with above information and to expect an envelope from [facility]. Pt verbalized understanding.

## 2019-04-16 DIAGNOSIS — D696 Thrombocytopenia, unspecified: Secondary | ICD-10-CM | POA: Diagnosis not present

## 2019-04-16 DIAGNOSIS — Z86718 Personal history of other venous thrombosis and embolism: Secondary | ICD-10-CM | POA: Diagnosis not present

## 2019-04-16 DIAGNOSIS — C3492 Malignant neoplasm of unspecified part of left bronchus or lung: Secondary | ICD-10-CM | POA: Diagnosis not present

## 2019-04-16 DIAGNOSIS — E43 Unspecified severe protein-calorie malnutrition: Secondary | ICD-10-CM | POA: Diagnosis not present

## 2019-04-16 DIAGNOSIS — C787 Secondary malignant neoplasm of liver and intrahepatic bile duct: Secondary | ICD-10-CM | POA: Diagnosis not present

## 2019-04-16 DIAGNOSIS — C7931 Secondary malignant neoplasm of brain: Secondary | ICD-10-CM | POA: Diagnosis not present

## 2019-04-17 DIAGNOSIS — C3492 Malignant neoplasm of unspecified part of left bronchus or lung: Secondary | ICD-10-CM | POA: Diagnosis not present

## 2019-04-17 DIAGNOSIS — C787 Secondary malignant neoplasm of liver and intrahepatic bile duct: Secondary | ICD-10-CM | POA: Diagnosis not present

## 2019-04-17 DIAGNOSIS — C7931 Secondary malignant neoplasm of brain: Secondary | ICD-10-CM | POA: Diagnosis not present

## 2019-04-17 DIAGNOSIS — Z86718 Personal history of other venous thrombosis and embolism: Secondary | ICD-10-CM | POA: Diagnosis not present

## 2019-04-17 DIAGNOSIS — D696 Thrombocytopenia, unspecified: Secondary | ICD-10-CM | POA: Diagnosis not present

## 2019-04-17 DIAGNOSIS — E43 Unspecified severe protein-calorie malnutrition: Secondary | ICD-10-CM | POA: Diagnosis not present

## 2019-04-18 DIAGNOSIS — C787 Secondary malignant neoplasm of liver and intrahepatic bile duct: Secondary | ICD-10-CM | POA: Diagnosis not present

## 2019-04-18 DIAGNOSIS — Z86718 Personal history of other venous thrombosis and embolism: Secondary | ICD-10-CM | POA: Diagnosis not present

## 2019-04-18 DIAGNOSIS — D696 Thrombocytopenia, unspecified: Secondary | ICD-10-CM | POA: Diagnosis not present

## 2019-04-18 DIAGNOSIS — E43 Unspecified severe protein-calorie malnutrition: Secondary | ICD-10-CM | POA: Diagnosis not present

## 2019-04-18 DIAGNOSIS — C3492 Malignant neoplasm of unspecified part of left bronchus or lung: Secondary | ICD-10-CM | POA: Diagnosis not present

## 2019-04-18 DIAGNOSIS — C7931 Secondary malignant neoplasm of brain: Secondary | ICD-10-CM | POA: Diagnosis not present

## 2019-04-19 DIAGNOSIS — C7931 Secondary malignant neoplasm of brain: Secondary | ICD-10-CM | POA: Diagnosis not present

## 2019-04-19 DIAGNOSIS — E43 Unspecified severe protein-calorie malnutrition: Secondary | ICD-10-CM | POA: Diagnosis not present

## 2019-04-19 DIAGNOSIS — Z86718 Personal history of other venous thrombosis and embolism: Secondary | ICD-10-CM | POA: Diagnosis not present

## 2019-04-19 DIAGNOSIS — C3492 Malignant neoplasm of unspecified part of left bronchus or lung: Secondary | ICD-10-CM | POA: Diagnosis not present

## 2019-04-19 DIAGNOSIS — C787 Secondary malignant neoplasm of liver and intrahepatic bile duct: Secondary | ICD-10-CM | POA: Diagnosis not present

## 2019-04-19 DIAGNOSIS — D696 Thrombocytopenia, unspecified: Secondary | ICD-10-CM | POA: Diagnosis not present

## 2019-04-20 DIAGNOSIS — Z86718 Personal history of other venous thrombosis and embolism: Secondary | ICD-10-CM | POA: Diagnosis not present

## 2019-04-20 DIAGNOSIS — D696 Thrombocytopenia, unspecified: Secondary | ICD-10-CM | POA: Diagnosis not present

## 2019-04-20 DIAGNOSIS — C3492 Malignant neoplasm of unspecified part of left bronchus or lung: Secondary | ICD-10-CM | POA: Diagnosis not present

## 2019-04-20 DIAGNOSIS — E43 Unspecified severe protein-calorie malnutrition: Secondary | ICD-10-CM | POA: Diagnosis not present

## 2019-04-20 DIAGNOSIS — C7931 Secondary malignant neoplasm of brain: Secondary | ICD-10-CM | POA: Diagnosis not present

## 2019-04-20 DIAGNOSIS — C787 Secondary malignant neoplasm of liver and intrahepatic bile duct: Secondary | ICD-10-CM | POA: Diagnosis not present

## 2019-04-21 DIAGNOSIS — C7931 Secondary malignant neoplasm of brain: Secondary | ICD-10-CM | POA: Diagnosis not present

## 2019-04-21 DIAGNOSIS — D696 Thrombocytopenia, unspecified: Secondary | ICD-10-CM | POA: Diagnosis not present

## 2019-04-21 DIAGNOSIS — E43 Unspecified severe protein-calorie malnutrition: Secondary | ICD-10-CM | POA: Diagnosis not present

## 2019-04-21 DIAGNOSIS — C787 Secondary malignant neoplasm of liver and intrahepatic bile duct: Secondary | ICD-10-CM | POA: Diagnosis not present

## 2019-04-21 DIAGNOSIS — C3492 Malignant neoplasm of unspecified part of left bronchus or lung: Secondary | ICD-10-CM | POA: Diagnosis not present

## 2019-04-21 DIAGNOSIS — Z86718 Personal history of other venous thrombosis and embolism: Secondary | ICD-10-CM | POA: Diagnosis not present

## 2019-04-22 DIAGNOSIS — E43 Unspecified severe protein-calorie malnutrition: Secondary | ICD-10-CM | POA: Diagnosis not present

## 2019-04-22 DIAGNOSIS — C787 Secondary malignant neoplasm of liver and intrahepatic bile duct: Secondary | ICD-10-CM | POA: Diagnosis not present

## 2019-04-22 DIAGNOSIS — D696 Thrombocytopenia, unspecified: Secondary | ICD-10-CM | POA: Diagnosis not present

## 2019-04-22 DIAGNOSIS — C7931 Secondary malignant neoplasm of brain: Secondary | ICD-10-CM | POA: Diagnosis not present

## 2019-04-22 DIAGNOSIS — C3492 Malignant neoplasm of unspecified part of left bronchus or lung: Secondary | ICD-10-CM | POA: Diagnosis not present

## 2019-04-22 DIAGNOSIS — Z86718 Personal history of other venous thrombosis and embolism: Secondary | ICD-10-CM | POA: Diagnosis not present

## 2019-04-23 ENCOUNTER — Telehealth: Payer: Self-pay | Admitting: *Deleted

## 2019-04-23 NOTE — Telephone Encounter (Signed)
Mr Rodenbeck called to say that Pamala Hurry died last night

## 2019-04-29 DEATH — deceased

## 2019-12-29 IMAGING — CT CT ANGIO CHEST
2 of 6 series · 17 of 46 positions shown · IV contrast (ISOVUE)
Comparison: Multiple prior CTs dating back through 08/07/2017

CLINICAL DATA: Dyspnea and chest pain starting 2 days ago.

EXAM:
CT ANGIOGRAPHY CHEST WITH CONTRAST
TECHNIQUE: Multidetector CT imaging of the chest was performed using the
standard protocol during bolus administration of intravenous
contrast. Multiplanar CT image reconstructions and MIPs were
obtained to evaluate the vascular anatomy.
CONTRAST:  100mL JDK5TN-YLG IOPAMIDOL (JDK5TN-YLG) INJECTION 76%

[Series 5: thins · axial · 0.67mm/px · z∈[+1364,+1642]mm · 14 of 306 slices shown]
[im 14/306  lung]
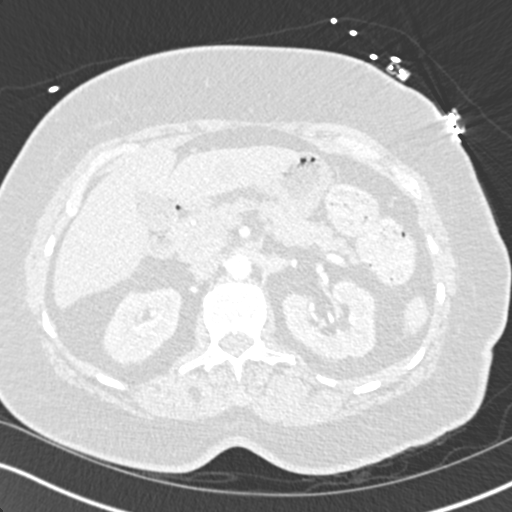
[im 40/306  soft-tissue]
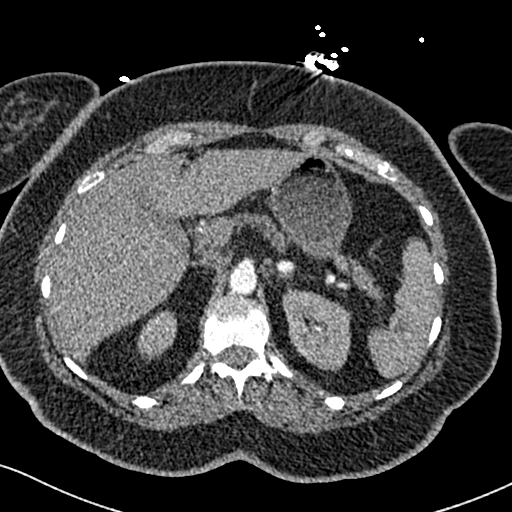
[im 54/306  lung]
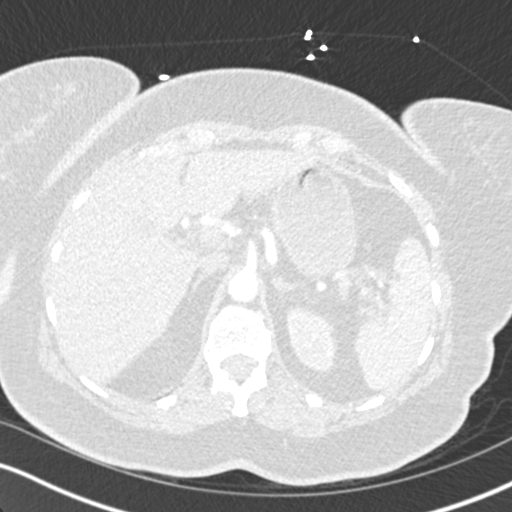
[im 80/306  soft-tissue]
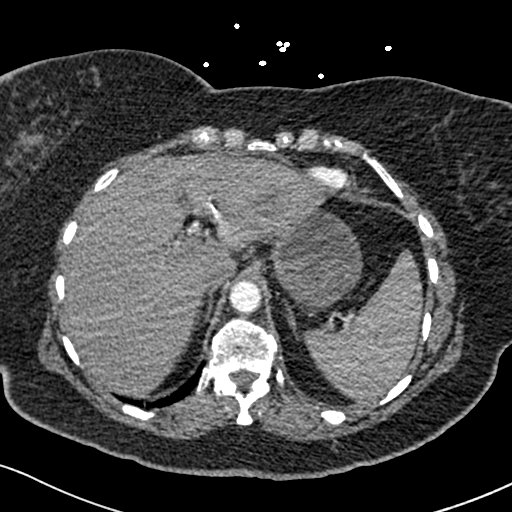
[im 107/306  lung]
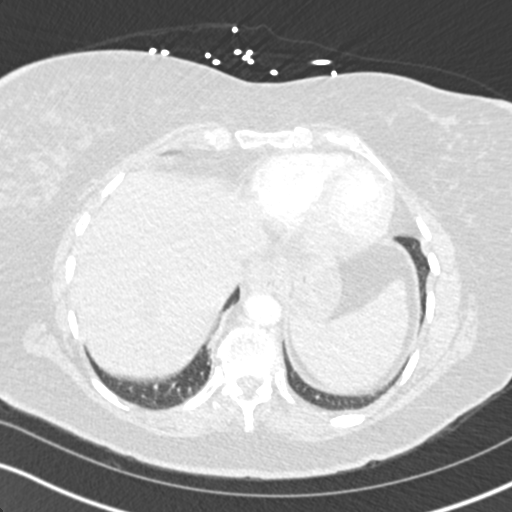
[im 120/306  soft-tissue]
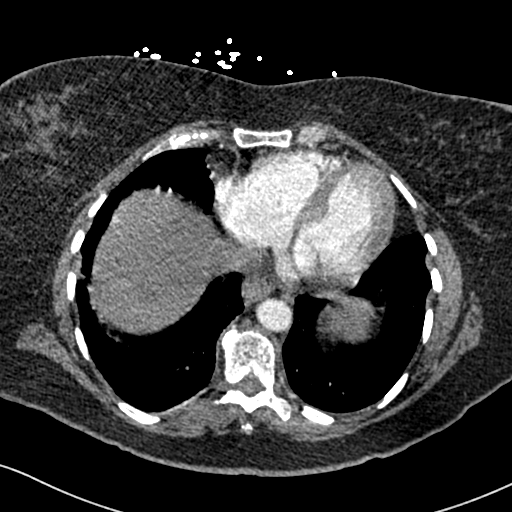
[im 146/306  lung]
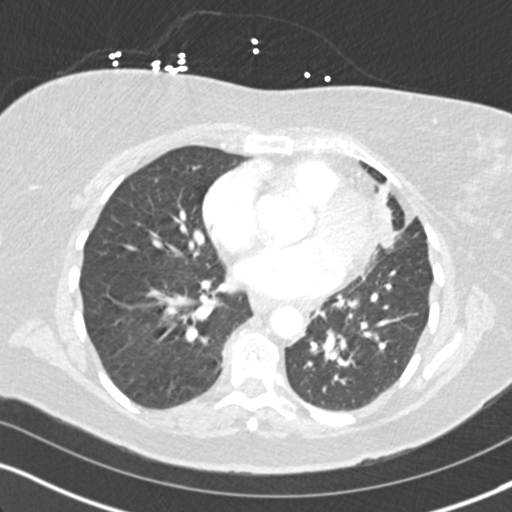
[im 160/306  soft-tissue]
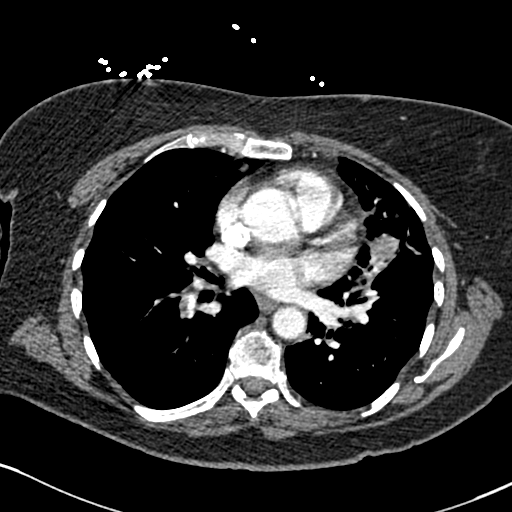
[im 186/306  lung]
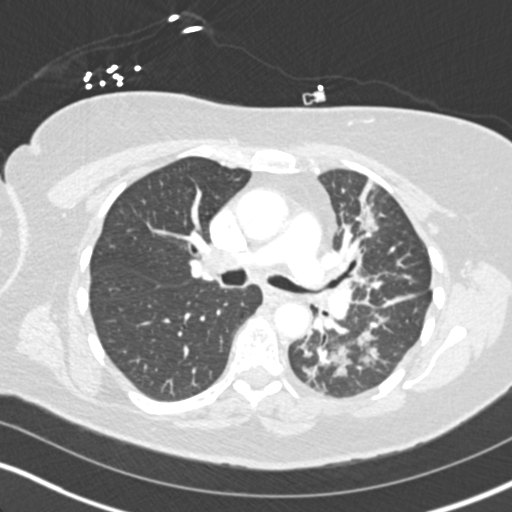
[im 199/306  soft-tissue]
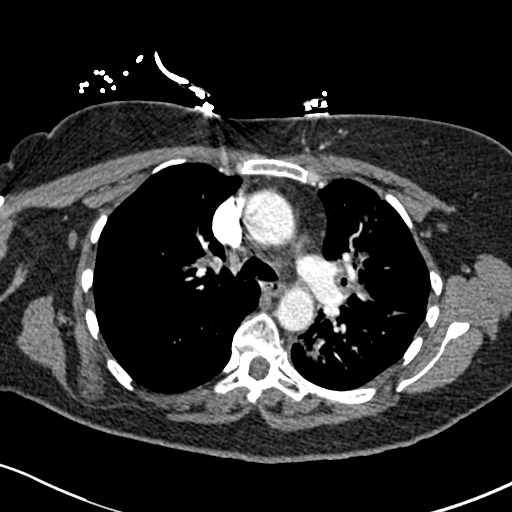
[im 226/306  lung]
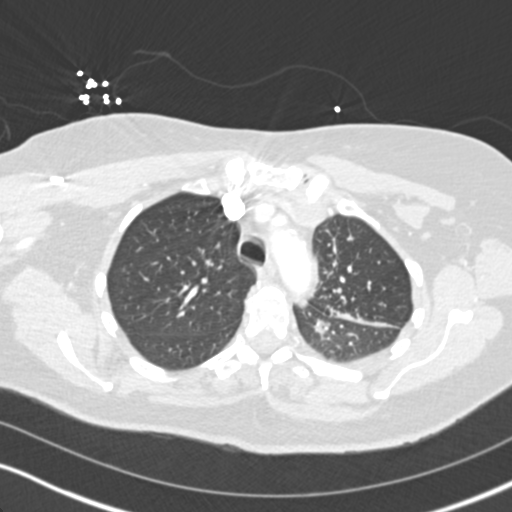
[im 252/306  soft-tissue]
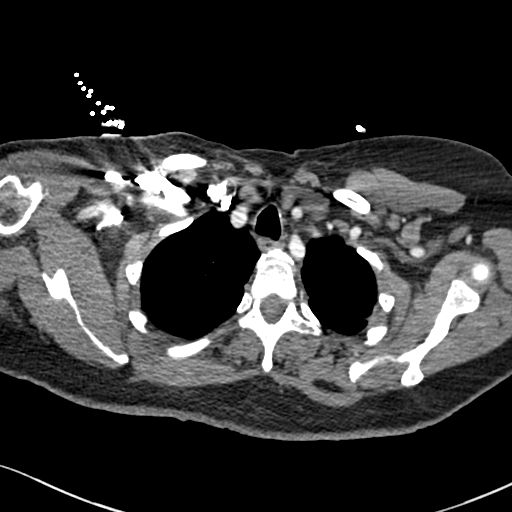
[im 266/306  lung]
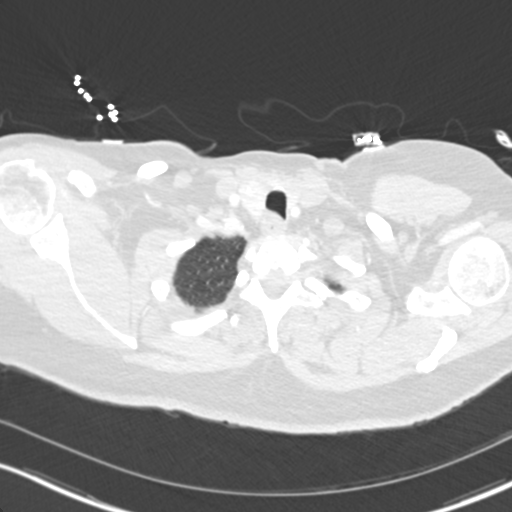
[im 292/306  soft-tissue]
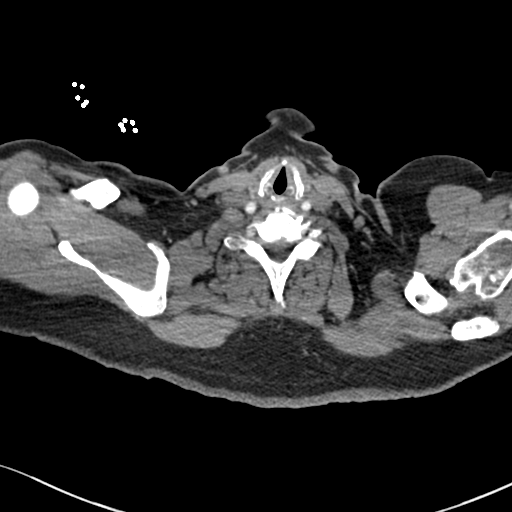

[Series 6: coronal mpr · coronal · 0.59mm/px · 3 of 151 slices shown]
[im 38/151  soft-tissue]
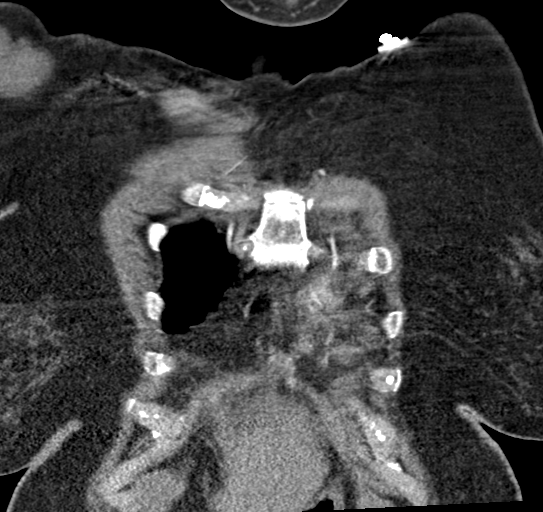
[im 76/151  soft-tissue]
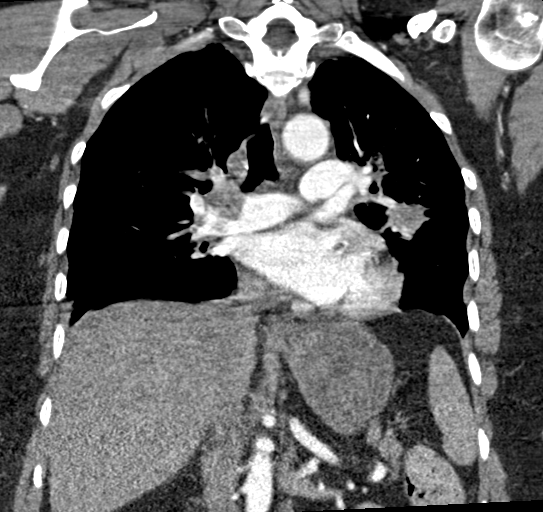
[im 113/151  soft-tissue]
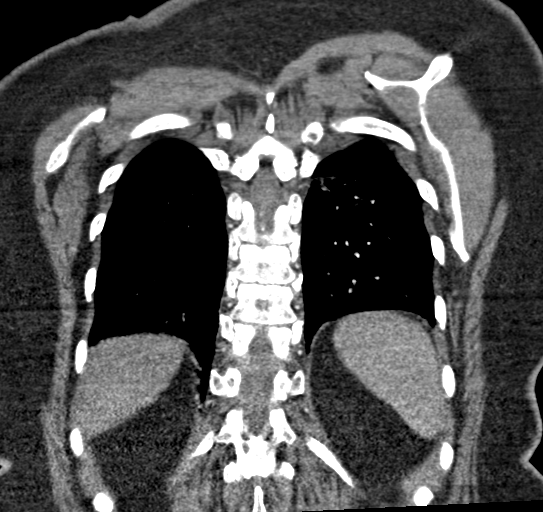

[17 of 46 positions shown; findings below may reference images not displayed]

FINDINGS: Cardiovascular: Positive pulmonary embolus within the distal right
main pulmonary artery extending into the right lower lobe lobar and
segmental branches. RV/LV ratio 1.06 consistent with right heart
strain. Heart size is normal. No pericardial effusion or thickening.
Nonaneurysmal thoracic aorta.

Mediastinum/Nodes: No change and subcentimeter short axis left para
soft MAYDHAX lymph node now estimated 8 mm versus 9 mm short axis. No
mediastinal adenopathy. The esophagus is unremarkable. Trachea is
patent. Mainstem bronchi are patent. Slight luminal narrowing from
extrinsic slight mass effect of the left upper lobe and lingular
bronchi.

Lungs/Pleura: Smaller left suprahilar upper lobe masslike opacity,
series [DATE] measuring 18 x 17 mm versus 26 x 20 mm previously,
series [DATE]. Perihilar masslike opacity in the left hilar region
predominantly within the left upper lobe in appearance with new
bandlike subpleural area of atelectasis and/or scarring in the left
upper lobe, series [DATE]. Masslike opacity adjacent to the major
fissure within the lingula is redemonstrated also stable in
appearance measuring approximately 25 x 20 mm and 26 x 19 mm
previously upon my re-measurements. New nonspecific spiculated
opacities occupying the superior segment of left lower lobe are new
since prior. Although findings may be postinfectious or inflammatory
in etiology, the possibility of neoplastic disease accounting for
the spiculated left lower lobe opacities is not excluded. Right lung
remains relatively clear and stable.

Upper Abdomen: Subtle hypodense mass in the right hepatic lobe
measuring 26 x 25 mm is slightly larger versus 25 x 22 mm
previously. Additional lesion in the left hepatic lobe measuring
approximately 28 x 26 mm is stable. A third lesion further caudad in
the left hepatic lobe measuring 15 mm diameter is stable. A fourth
lesion in the right hepatic lobe adjacent to the gallbladder fossa
is stable to slightly smaller at 20 x 17 mm versus 23 x 22 mm
previously.

Musculoskeletal: Thoracic spine degenerative changes without
aggressive osseous lesions. Unchanged mixed sclerotic and cystic
lesion of the proximal left humerus possibly cartilaginous.
Metastatic disease not entirely excluded.

Review of the MIP images confirms the above findings.
IMPRESSION: 1. Acute pulmonary emboli within the right main pulmonary artery
extending into the right lower lobe with right heart strain, RV/LV
ratio 1.06.
2. Redemonstration of ill-defined masslike opacities in the left
upper lobe and lingula with new patchy nonspecific slightly
spiculated pulmonary opacities occupying the superior segment of
left lower lobe. Progression of metastatic disease is of concern now
involving superior segment left lower lobe. Otherwise, the
suprahilar and lingular opacities are remained relatively stable.
3. Redemonstration of several hypodense hepatic masses most of which
have remained stable to slightly smaller in appearance.

These results were called by telephone at the time of interpretation
on 03/06/2018 at [DATE] to Dr. MELYNDA BILLIOT , who verbally
acknowledged these results.

Aortic Atherosclerosis ([id number]).

## 2020-02-10 IMAGING — CR DG CHEST 2V
2 series · 2 of 2 positions shown · non-contrast
Comparison: 03/06/2018 chest CT

CLINICAL DATA: Low white blood count. Cough. History of lung cancer

EXAM:
CHEST - 2 VIEW

[w chest lat]
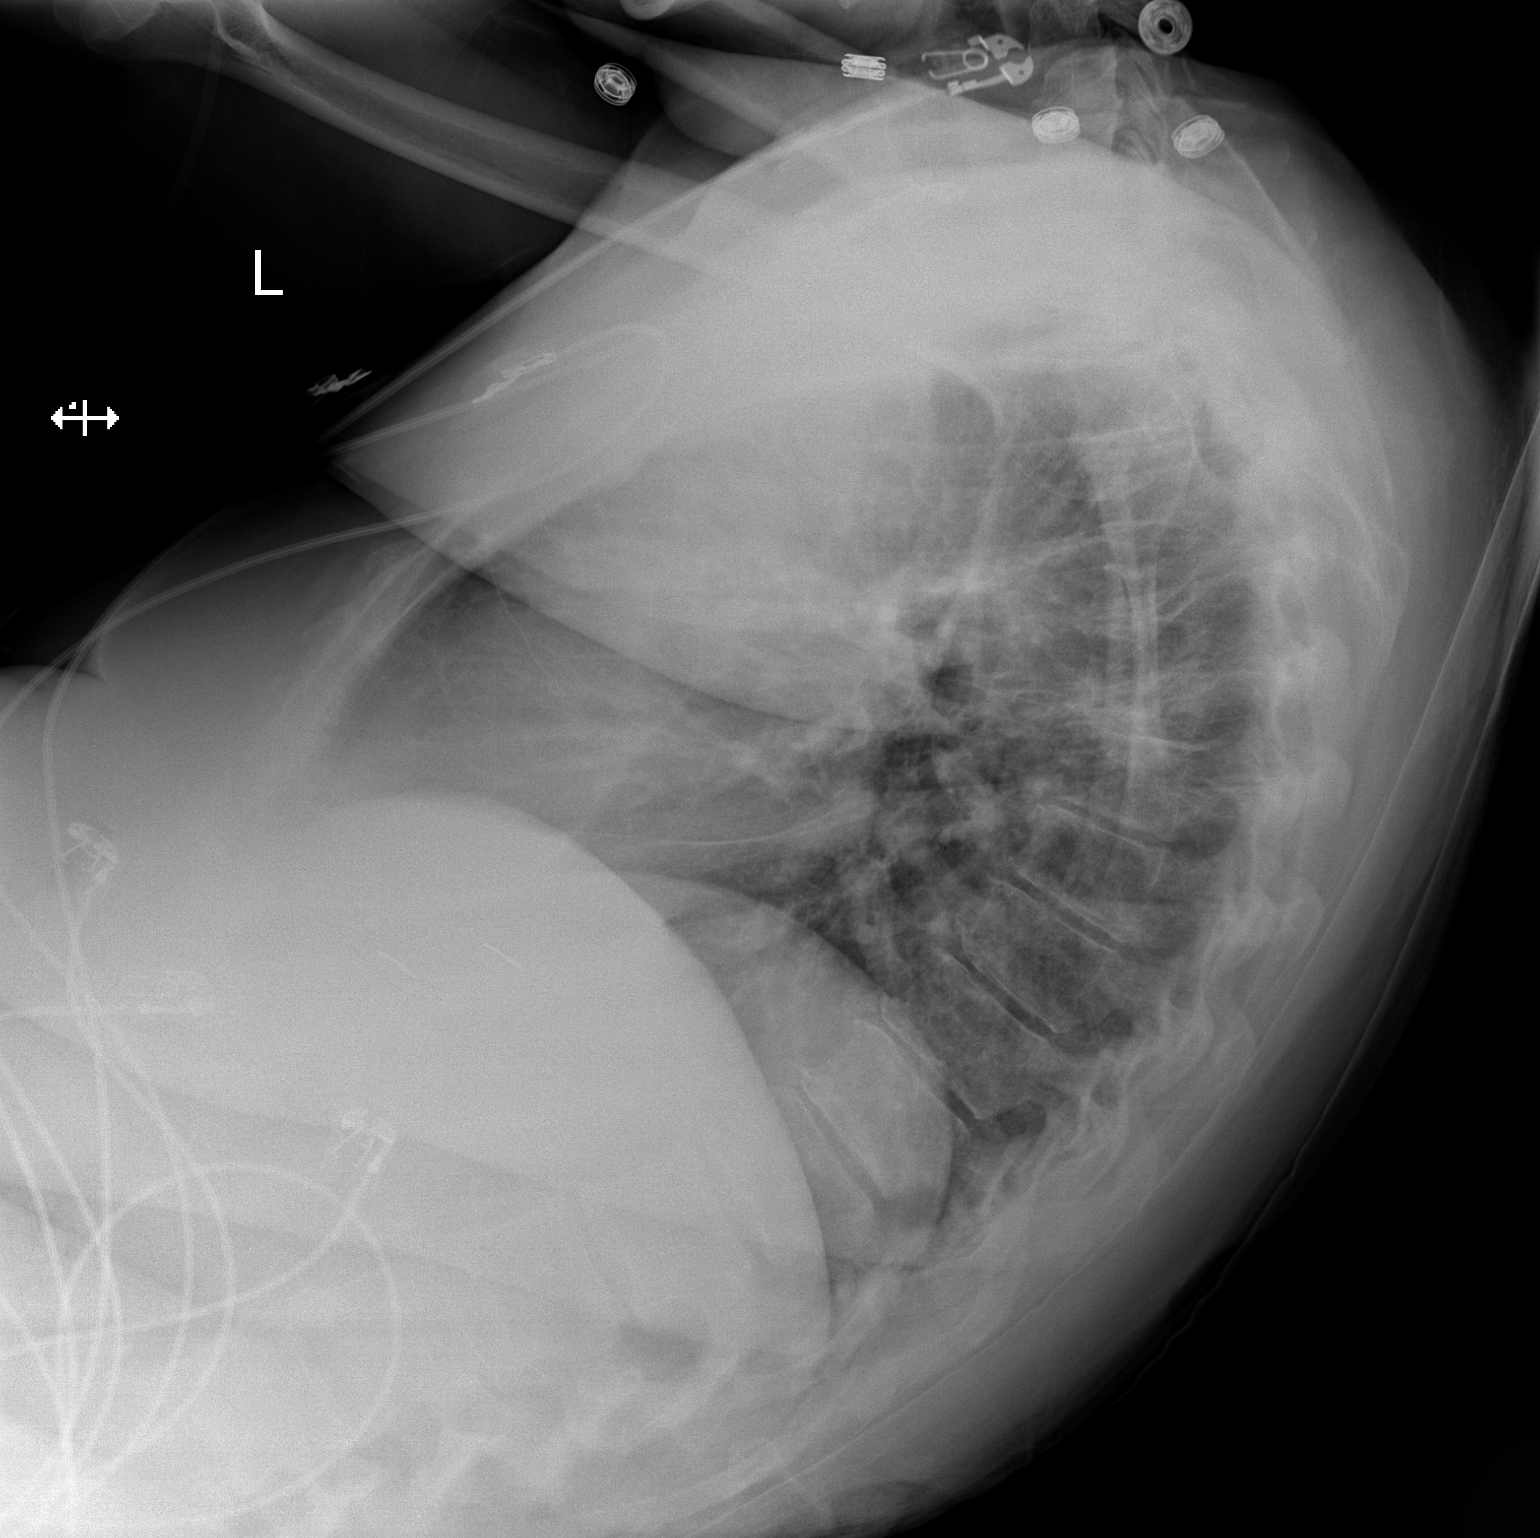

[x chest ap]
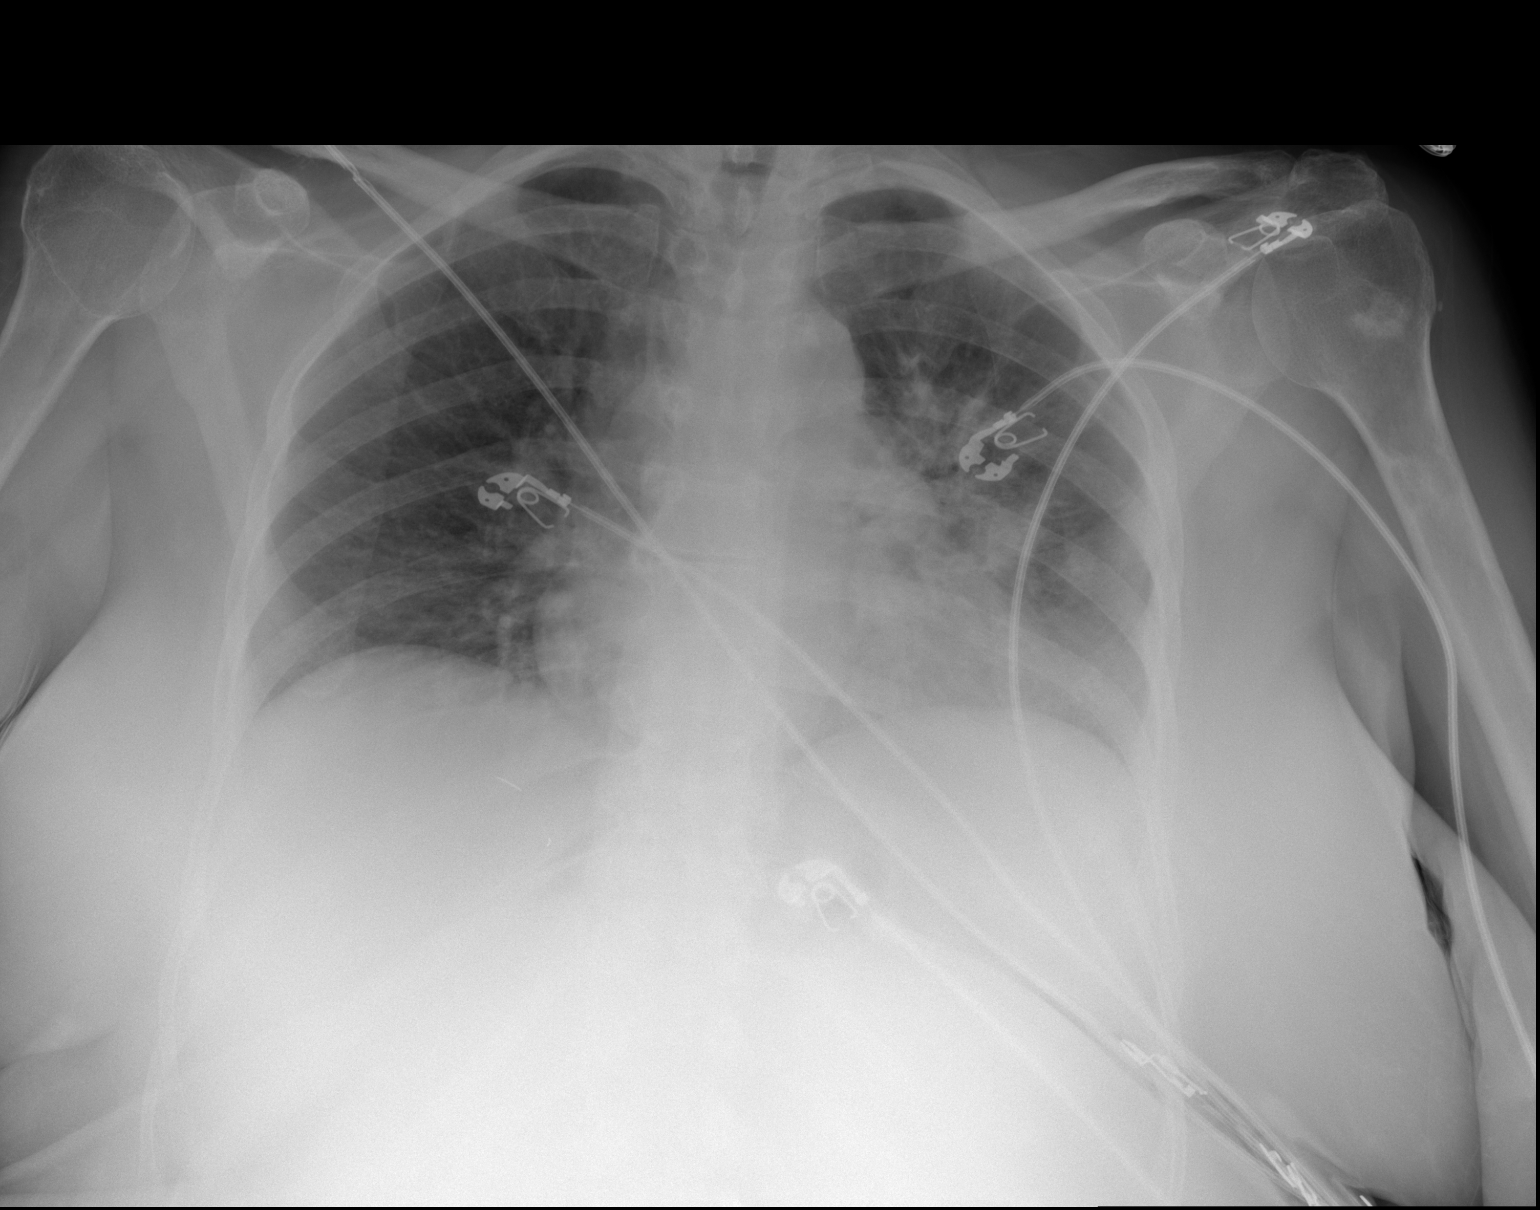

[2 of 2 positions shown; findings below may reference images not displayed]

FINDINGS: Left perihilar opacity correlating with airspace and nodular
densities on 03/06/[address] chest CT. The right lung is clear although
low volume. Normal heart size and mediastinal contours. There is a
sclerotic focus in the left proximal humeral metaphysis that is
likely chondroid based on comparison chest CT.
IMPRESSION: Left perihilar opacity without convincing change from 03/06/[address]
chest CT-please reference that report. Superimposed pneumonia may be
obscured.
